# Patient Record
Sex: Male | Born: 1945
Health system: Southern US, Community
[De-identification: ages and names within clinical notes are randomized; demographics above are authoritative.]

## PROBLEM LIST (undated history)

## (undated) DIAGNOSIS — G4730 Sleep apnea, unspecified: Secondary | ICD-10-CM

## (undated) DIAGNOSIS — I639 Cerebral infarction, unspecified: Secondary | ICD-10-CM

## (undated) DIAGNOSIS — I480 Paroxysmal atrial fibrillation: Secondary | ICD-10-CM

## (undated) DIAGNOSIS — K219 Gastro-esophageal reflux disease without esophagitis: Secondary | ICD-10-CM

## (undated) DIAGNOSIS — F32A Depression, unspecified: Secondary | ICD-10-CM

## (undated) DIAGNOSIS — K589 Irritable bowel syndrome without diarrhea: Secondary | ICD-10-CM

## (undated) DIAGNOSIS — C4490 Unspecified malignant neoplasm of skin, unspecified: Secondary | ICD-10-CM

## (undated) DIAGNOSIS — J189 Pneumonia, unspecified organism: Secondary | ICD-10-CM

## (undated) DIAGNOSIS — E785 Hyperlipidemia, unspecified: Secondary | ICD-10-CM

## (undated) DIAGNOSIS — I219 Acute myocardial infarction, unspecified: Secondary | ICD-10-CM

## (undated) DIAGNOSIS — I10 Essential (primary) hypertension: Secondary | ICD-10-CM

## (undated) DIAGNOSIS — I2510 Atherosclerotic heart disease of native coronary artery without angina pectoris: Secondary | ICD-10-CM

## (undated) DIAGNOSIS — F329 Major depressive disorder, single episode, unspecified: Secondary | ICD-10-CM

## (undated) DIAGNOSIS — N400 Enlarged prostate without lower urinary tract symptoms: Secondary | ICD-10-CM

## (undated) HISTORY — DX: Depression, unspecified: F32.A

## (undated) HISTORY — DX: Benign prostatic hyperplasia without lower urinary tract symptoms: N40.0

## (undated) HISTORY — DX: Hyperlipidemia, unspecified: E78.5

## (undated) HISTORY — PX: UPPER GASTROINTESTINAL ENDOSCOPY: SHX188

## (undated) HISTORY — PX: THYROIDECTOMY, PARTIAL: SHX18

## (undated) HISTORY — PX: VASCULAR SURGERY: SHX849

## (undated) HISTORY — DX: Sleep apnea, unspecified: G47.30

## (undated) HISTORY — DX: Gastro-esophageal reflux disease without esophagitis: K21.9

## (undated) HISTORY — DX: Unspecified malignant neoplasm of skin, unspecified: C44.90

## (undated) HISTORY — PX: COLONOSCOPY: SHX174

## (undated) HISTORY — PX: CORONARY ANGIOPLASTY: SHX604

## (undated) HISTORY — DX: Paroxysmal atrial fibrillation: I48.0

## (undated) HISTORY — DX: Essential (primary) hypertension: I10

## (undated) HISTORY — DX: Cerebral infarction, unspecified: I63.9

## (undated) HISTORY — DX: Irritable bowel syndrome, unspecified: K58.9

## (undated) HISTORY — DX: Major depressive disorder, single episode, unspecified: F32.9

## (undated) HISTORY — PX: POLYPECTOMY: SHX149

## (undated) HISTORY — DX: Pneumonia, unspecified organism: J18.9

## (undated) HISTORY — DX: Atherosclerotic heart disease of native coronary artery without angina pectoris: I25.10

## (undated) HISTORY — DX: Acute myocardial infarction, unspecified: I21.9

---
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
## 2004-06-08 HISTORY — PX: CARDIAC CATHETERIZATION: SHX172

## 2012-10-27 LAB — TSH: TSH: 2.04 u[IU]/mL (ref 0.41–5.90)

## 2012-10-27 LAB — LIPID PANEL
Cholesterol: 177 mg/dL (ref 0–200)
Cholesterol: 177 mg/dL (ref 0–200)
HDL: 59 mg/dL (ref 35–70)
LDL Cholesterol: 78 mg/dL
TRIGLYCERIDES: 199 mg/dL — AB (ref 40–160)

## 2012-10-27 LAB — BASIC METABOLIC PANEL
BUN: 14 mg/dL (ref 4–21)
CREATININE: 1 mg/dL (ref 0.6–1.3)
Glucose: 96 mg/dL
Potassium: 4.6 mmol/L (ref 3.4–5.3)
Sodium: 140 mmol/L (ref 137–147)

## 2012-10-27 LAB — CBC AND DIFFERENTIAL
HCT: 44 % (ref 41–53)
HCT: 44 % (ref 41–53)
HEMOGLOBIN: 15.1 g/dL (ref 13.5–17.5)
HEMOGLOBIN: 15.1 g/dL (ref 13.5–17.5)
Neutrophils Absolute: 2 /uL
Neutrophils Absolute: 2 /uL
PLATELETS: 194 10*3/uL (ref 150–399)
Platelets: 194 10*3/uL (ref 150–399)
WBC: 4.3 10^3/mL
WBC: 4.3 10*3/mL

## 2012-10-27 LAB — HEPATIC FUNCTION PANEL
ALK PHOS: 85 U/L (ref 25–125)
ALT: 42 U/L — AB (ref 10–40)
AST: 40 U/L (ref 14–40)
BILIRUBIN, TOTAL: 0.5 mg/dL

## 2012-10-27 LAB — HEMOGLOBIN A1C: HEMOGLOBIN A1C: 5.4 % (ref 4.0–6.0)

## 2012-11-08 LAB — HM COLONOSCOPY

## 2014-10-16 LAB — BASIC METABOLIC PANEL
BUN: 16 mg/dL (ref 4–21)
CREATININE: 1.1 mg/dL (ref 0.6–1.3)
Glucose: 102 mg/dL
Potassium: 5.4 mmol/L — AB (ref 3.4–5.3)
Sodium: 140 mmol/L (ref 137–147)

## 2014-10-16 LAB — HEPATIC FUNCTION PANEL
ALK PHOS: 69 U/L (ref 25–125)
ALT: 29 U/L (ref 10–40)
AST: 23 U/L (ref 14–40)
Bilirubin, Total: 0.8 mg/dL

## 2014-10-16 LAB — CBC AND DIFFERENTIAL
HCT: 47 % (ref 41–53)
HEMOGLOBIN: 15.3 g/dL (ref 13.5–17.5)
NEUTROS ABS: 3 /uL
PLATELETS: 223 10*3/uL (ref 150–399)
WBC: 5.1 10*3/mL

## 2014-10-16 LAB — PSA: PSA: 2.035

## 2014-10-16 LAB — LIPID PANEL
CHOLESTEROL: 183 mg/dL (ref 0–200)
HDL: 63 mg/dL (ref 35–70)
LDL CALC: 98 mg/dL
TRIGLYCERIDES: 112 mg/dL (ref 40–160)

## 2014-10-16 LAB — TSH: TSH: 1.24 u[IU]/mL (ref 0.41–5.90)

## 2014-10-16 LAB — HEMOGLOBIN A1C: Hgb A1c MFr Bld: 5.3 % (ref 4.0–6.0)

## 2015-01-23 ENCOUNTER — Ambulatory Visit (INDEPENDENT_AMBULATORY_CARE_PROVIDER_SITE_OTHER): Payer: Medicare Other | Admitting: Cardiovascular Disease

## 2015-01-23 ENCOUNTER — Ambulatory Visit (INDEPENDENT_AMBULATORY_CARE_PROVIDER_SITE_OTHER): Payer: Medicare Other

## 2015-01-23 ENCOUNTER — Encounter (INDEPENDENT_AMBULATORY_CARE_PROVIDER_SITE_OTHER): Payer: Self-pay

## 2015-01-23 ENCOUNTER — Encounter: Payer: Self-pay | Admitting: Cardiovascular Disease

## 2015-01-23 VITALS — BP 122/90 | HR 64 | Ht 73.0 in | Wt 181.5 lb

## 2015-01-23 DIAGNOSIS — Z5181 Encounter for therapeutic drug level monitoring: Secondary | ICD-10-CM

## 2015-01-23 DIAGNOSIS — E785 Hyperlipidemia, unspecified: Secondary | ICD-10-CM | POA: Diagnosis not present

## 2015-01-23 DIAGNOSIS — I4891 Unspecified atrial fibrillation: Secondary | ICD-10-CM | POA: Diagnosis not present

## 2015-01-23 DIAGNOSIS — I639 Cerebral infarction, unspecified: Secondary | ICD-10-CM | POA: Diagnosis not present

## 2015-01-23 DIAGNOSIS — I480 Paroxysmal atrial fibrillation: Secondary | ICD-10-CM

## 2015-01-23 DIAGNOSIS — Z8673 Personal history of transient ischemic attack (TIA), and cerebral infarction without residual deficits: Secondary | ICD-10-CM | POA: Insufficient documentation

## 2015-01-23 DIAGNOSIS — I2510 Atherosclerotic heart disease of native coronary artery without angina pectoris: Secondary | ICD-10-CM | POA: Diagnosis not present

## 2015-01-23 LAB — POCT INR: INR: 3.4

## 2015-01-23 NOTE — Patient Instructions (Addendum)
Check the price of the following anticoagulants:  Pradaxa 150 mg twice a day Xarelto 20 mg a day Eliquis 5 mg twice a day Savaysa 60 mg a day.  Medication Instructions:  Your physician recommends that you continue on your current medications as directed. Please refer to the Current Medication list given to you today.   Labwork: Coumadin Clinic today Your physician recommends that you return for lab work in one year: lipid, liver, BMET   Testing/Procedures: none  Follow-Up: Your physician wants you to follow-up in: one year with Dr. Acie Fredrickson. You will receive a reminder letter in the mail two months in advance. If you don't receive a letter, please call our office to schedule the follow-up appointment.   Any Other Special Instructions Will Be Listed Below (If Applicable).

## 2015-01-23 NOTE — Progress Notes (Signed)
Cardiology Office Note   Date:  01/23/2015   ID:  [name], DOB 1946-04-17, MRN [id number]  PCP:  Thersa Salt, DO  Cardiologist:   Acie Fredrickson Wonda Cheng, MD   Chief Complaint  Patient presents with  . other    Est. care recently moved from [place]. Hx afib discuss coumadin. Meds reviewed verbally with pt.   Problem List 1. Paroxysmal atrial fib  2. CAD - s/p stenting , Jan. 2007 , mini vision to LCx 3. CVA - 2 different strokes, the 2nd CVA was while on coumadin First stroke was major, was paralyzed on left side, received tPA and improved significantly  4. Hyperlipidemia 5. Essential hypertension     History of Present Illness: [name] is a 69 y.o. male who presents for evaluation of his paroxysmal atrial fib.  [name] recently moved from [place] , [place]. Is retired as a Media planner regularly, no CP or dyspnea Has some residual left sided weakness but otherwise is doing ok  Has very rare episodes of atrial fib.  Required a 14 day monitor that eventually. He was generally asymptomatic during the episodes     Past Medical History  Diagnosis Date  . A-fib   . Coronary artery disease   . MI (myocardial infarction)   . Hyperlipidemia   . Hypertension   . Stroke     Past Surgical History  Procedure Laterality Date  . Thyroidectomy, partial    . Cardiac catheterization  2006    X1 STENT     Current Outpatient Prescriptions  Medication Sig Dispense Refill  . aspirin 81 MG tablet Take 81 mg by mouth daily.    Marland Kitchen atorvastatin (LIPITOR) 20 MG tablet Take 20 mg by mouth daily.    . bisoprolol (ZEBETA) 5 MG tablet Take 5 mg by mouth daily.    . Cholecalciferol (VITAMIN D) 2000 UNITS CAPS Take by mouth 2 (two) times daily.    Marland Kitchen dicyclomine (BENTYL) 10 MG capsule Take 10 mg by mouth 2 (two) times daily.    Marland Kitchen escitalopram (LEXAPRO) 10 MG tablet Take 5 mg by mouth daily.    . hydrochlorothiazide (MICROZIDE) 12.5 MG capsule Take 12.5 mg by mouth  daily.    Marland Kitchen lisinopril (PRINIVIL,ZESTRIL) 20 MG tablet Take 20 mg by mouth 2 (two) times daily.    . ranitidine (ZANTAC) 150 MG tablet Take 150 mg by mouth 2 (two) times daily.    . vitamin B-12 (CYANOCOBALAMIN) 1000 MCG tablet Take 1,000 mcg by mouth daily.    Marland Kitchen warfarin (COUMADIN) 5 MG tablet Take 5 mg by mouth as directed. 1 1/2 TABLET PM EVENING EXCEPT 2 ON SUN. & WED.     No current facility-administered medications for this visit.    Allergies:   Bactrim and Statins    Social History:  The patient  reports that he has quit smoking. He does not have any smokeless tobacco history on file. He reports that he drinks alcohol. He reports that he does not use illicit drugs.   Family History:  The patient'sfamily history includes Heart Problems in his father.    ROS:  Please see the history of present illness.    Review of Systems: Constitutional:  denies fever, chills, diaphoresis, appetite change and fatigue.  HEENT: denies photophobia, eye pain, redness, hearing loss, ear pain, congestion, sore throat, rhinorrhea, sneezing, neck pain, neck stiffness and tinnitus.  Respiratory: denies SOB, DOE, cough, chest tightness, and wheezing.  Cardiovascular: denies chest  pain, palpitations and leg swelling.  Gastrointestinal: denies nausea, vomiting, abdominal pain, diarrhea, constipation, blood in stool.  Genitourinary: denies dysuria, urgency, frequency, hematuria, flank pain and difficulty urinating.  Musculoskeletal: denies  myalgias, back pain, joint swelling, arthralgias and gait problem.   Skin: denies pallor, rash and wound.  Neurological: denies dizziness, seizures, syncope, weakness, light-headedness, numbness and headaches.   Hematological: denies adenopathy, easy bruising, personal or family bleeding history.  Psychiatric/ Behavioral: denies suicidal ideation, mood changes, confusion, nervousness, sleep disturbance and agitation.       All other systems are reviewed and  negative.    PHYSICAL EXAM: VS:  BP 122/90 mmHg  Pulse 64  Ht 6\' 1"  (1.854 m)  Wt 82.328 kg (181 lb 8 oz)  BMI 23.95 kg/m2 , BMI Body mass index is 23.95 kg/(m^2). GEN: Well nourished, well developed, in no acute distress HEENT: normal Neck: no JVD, carotid bruits, or masses Cardiac: RRR; no murmurs, rubs, or gallops,no edema  Respiratory:  clear to auscultation bilaterally, normal work of breathing GI: soft, nontender, nondistended, + BS MS: no deformity or atrophy Skin: warm and dry, no rash Neuro:  Strength and sensation are intact Psych: normal   EKG:  EKG is ordered today. The ekg ordered today demonstrates  NSR at 64.  Normal     Recent Labs: No results found for requested labs within last 365 days.    Lipid Panel No results found for: CHOL, TRIG, HDL, CHOLHDL, VLDL, LDLCALC, LDLDIRECT    Wt Readings from Last 3 Encounters:  01/23/15 82.328 kg (181 lb 8 oz)      Other studies Reviewed: Additional studies/ records that were reviewed today include: . Review of the above records demonstrates:    ASSESSMENT AND PLAN:  1.  Paroxysmal atrial fibrillation: The patient has a history of paroxysmal atrial fibrillation. He's had multiple TIAs and 2 different strokes. He's now on Coumadin. He recently moved from [facility]. He's done very well. He's basically asymptomatic and cannot tell when he is in atrial fibrillation. He seems to be doing fine from a cardiac stent point. We'll enroll him in our Coumadin clinic. I've given him a list of the different new anticoagulaton agents and he can check the price of those.  For now we'll stable Coumadin. I'll see him again in one year for follow-up office visit.  2. Coronary artery disease: The patient has a history of stenting in the past. He's asymptomatic. He is able to do all of his normal activities without any significant problems.  3. Hyperlipidemia: We'll check fasting labs at his next office visit. Continue  current dose of atorvastatin.  4. Hypertension: Continue Current medications. His blood pressure is normal.    Current medicines are reviewed at length with the patient today.  The patient does not have concerns regarding medicines.  The following changes have been made:  no change  Labs/ tests ordered today include:  Orders Placed This Encounter  Procedures  . EKG 12-Lead     Disposition:   FU with me in 1 year      Nahser, Wonda Cheng, MD  01/23/2015 11:15 AM    [facility] [place], [place] [place]  [address] Phone: [phone]; Fax: [phone]   [facility]  [address] [place], [place]  [address] [phone]   Fax [phone]

## 2015-02-08 ENCOUNTER — Ambulatory Visit (INDEPENDENT_AMBULATORY_CARE_PROVIDER_SITE_OTHER): Payer: PPO | Admitting: Family Medicine

## 2015-02-08 ENCOUNTER — Encounter: Payer: Self-pay | Admitting: Family Medicine

## 2015-02-08 VITALS — BP 108/64 | HR 61 | Temp 98.1°F | Ht 72.5 in | Wt 183.5 lb

## 2015-02-08 DIAGNOSIS — F419 Anxiety disorder, unspecified: Secondary | ICD-10-CM | POA: Diagnosis not present

## 2015-02-08 DIAGNOSIS — I2510 Atherosclerotic heart disease of native coronary artery without angina pectoris: Secondary | ICD-10-CM | POA: Diagnosis not present

## 2015-02-08 DIAGNOSIS — Z7901 Long term (current) use of anticoagulants: Secondary | ICD-10-CM | POA: Diagnosis not present

## 2015-02-08 DIAGNOSIS — E785 Hyperlipidemia, unspecified: Secondary | ICD-10-CM

## 2015-02-08 DIAGNOSIS — K219 Gastro-esophageal reflux disease without esophagitis: Secondary | ICD-10-CM | POA: Insufficient documentation

## 2015-02-08 DIAGNOSIS — Z87898 Personal history of other specified conditions: Secondary | ICD-10-CM | POA: Diagnosis not present

## 2015-02-08 DIAGNOSIS — K589 Irritable bowel syndrome without diarrhea: Secondary | ICD-10-CM | POA: Insufficient documentation

## 2015-02-08 DIAGNOSIS — F329 Major depressive disorder, single episode, unspecified: Secondary | ICD-10-CM

## 2015-02-08 DIAGNOSIS — N400 Enlarged prostate without lower urinary tract symptoms: Secondary | ICD-10-CM | POA: Insufficient documentation

## 2015-02-08 DIAGNOSIS — I10 Essential (primary) hypertension: Secondary | ICD-10-CM | POA: Insufficient documentation

## 2015-02-08 DIAGNOSIS — F32A Depression, unspecified: Secondary | ICD-10-CM | POA: Insufficient documentation

## 2015-02-08 DIAGNOSIS — I480 Paroxysmal atrial fibrillation: Secondary | ICD-10-CM

## 2015-02-08 DIAGNOSIS — Z23 Encounter for immunization: Secondary | ICD-10-CM | POA: Diagnosis not present

## 2015-02-08 DIAGNOSIS — F418 Other specified anxiety disorders: Secondary | ICD-10-CM

## 2015-02-08 DIAGNOSIS — I639 Cerebral infarction, unspecified: Secondary | ICD-10-CM

## 2015-02-08 LAB — LIPID PANEL
CHOLESTEROL: 174 mg/dL (ref 0–200)
HDL: 58.1 mg/dL (ref >39.00)
LDL Cholesterol: 93 mg/dL (ref 0–99)
NONHDL: 115.95
Total CHOL/HDL Ratio: 3
Triglycerides: 114 mg/dL (ref 0.0–149.0)
VLDL: 22.8 mg/dL (ref 0.0–40.0)

## 2015-02-08 LAB — CBC
HEMATOCRIT: 43.7 % (ref 39.0–52.0)
Hemoglobin: 14.6 g/dL (ref 13.0–17.0)
MCHC: 33.4 g/dL (ref 30.0–36.0)
MCV: 93.6 fl (ref 78.0–100.0)
Platelets: 168 10*3/uL (ref 150.0–400.0)
RBC: 4.67 Mil/uL (ref 4.22–5.81)
RDW: 14.1 % (ref 11.5–15.5)
WBC: 5.9 10*3/uL (ref 4.0–10.5)

## 2015-02-08 LAB — COMPREHENSIVE METABOLIC PANEL
ALBUMIN: 4 g/dL (ref 3.5–5.2)
ALK PHOS: 81 U/L (ref 39–117)
ALT: 25 U/L (ref 0–53)
AST: 28 U/L (ref 0–37)
BILIRUBIN TOTAL: 0.8 mg/dL (ref 0.2–1.2)
BUN: 15 mg/dL (ref 6–23)
CO2: 31 mEq/L (ref 19–32)
CREATININE: 0.92 mg/dL (ref 0.40–1.50)
Calcium: 9.5 mg/dL (ref 8.4–10.5)
Chloride: 105 mEq/L (ref 96–112)
GFR: 86.71 mL/min (ref >60.00)
GLUCOSE: 95 mg/dL (ref 70–99)
POTASSIUM: 4.7 meq/L (ref 3.5–5.1)
SODIUM: 144 meq/L (ref 135–145)
TOTAL PROTEIN: 7 g/dL (ref 6.0–8.3)

## 2015-02-08 LAB — PSA: PSA: 0.9 ng/mL (ref 0.10–4.00)

## 2015-02-08 LAB — HEMOGLOBIN A1C: HEMOGLOBIN A1C: 5.3 % (ref 4.6–6.5)

## 2015-02-08 MED ORDER — WARFARIN SODIUM 5 MG PO TABS: 5.0000 mg | ORAL_TABLET | ORAL | Status: DC

## 2015-02-08 MED ORDER — ESCITALOPRAM OXALATE 10 MG PO TABS: 5.0000 mg | ORAL_TABLET | Freq: Every day | ORAL | Status: DC

## 2015-02-08 NOTE — Assessment & Plan Note (Signed)
Patient doing well at this time. He does have some residual deficits. Continue aspirin and Lipitor.

## 2015-02-08 NOTE — Assessment & Plan Note (Signed)
Per 2013 guidelines patient is a candidate for high potency statin therapy. I encouraged him to consider increasing his Lipitor to 40 mg daily. Given prior intolerance of statins patient would like to hold off on this until his cholesterol is reevaluated. Obtaining lipid panel today.

## 2015-02-08 NOTE — Progress Notes (Signed)
Pre visit review using our clinic review tool, if applicable. No additional management support is needed unless otherwise documented below in the visit note. 

## 2015-02-08 NOTE — Assessment & Plan Note (Signed)
Stable. Advised patient to continue ASA, Lipitor, Lisinopril/HCTZ and Bisoprolol. Advised close follow up with Cardiology.

## 2015-02-08 NOTE — Assessment & Plan Note (Signed)
Stable on bisoprolol and Coumadin. We had a brief discussion about new anti-coagulants.  Patient will like to call his insurance prior to starting 1 to see how much is going to cost. Will continue Coumadin for now.

## 2015-02-08 NOTE — Progress Notes (Signed)
Subjective:  Patient ID: [name], male    DOB: 1945/09/11  Age: 69 y.o. MRN: [id number]  CC: Establish care  HPI [name] is a 69 y.o. male presents to the clinic today to establish care.  Current issues are below:  1) CAD s/p MI and PCI  Stable.  No chest pain or SOB.  Followed closely by cardiology.  Compliant with ASA, Lisinopril, Lipitor, Bisoprolol.  2) A fib  Stable.   Doing well on coumadin; would like to discuss new anticoagulants.  3) CVA  Patient has had memory deficits and motor deficits (L sided weakness following CVA).  He is doing well at this time.  4) HLD  Uncontrolled; Last panel was 10/2014. At that time LDL was 98.  Complaint with Lipitor 20 mg daily.  Has had intolerance to crestor previously.   5) Anxiety/depression  Stable on Lexapro.  Requesting refill today.  6) HTN  Well controlled on Lisinopril/HCTZ and Bisoprolol.  PMH, Surgical Hx, Family Hx, Social History reviewed and updated as below. Past Medical History  Diagnosis Date  . A-fib   . Coronary artery disease     Hx of MI; S/P PCI  . Hyperlipidemia   . Hypertension   . Stroke   . GERD (gastroesophageal reflux disease)   . IBS (irritable bowel syndrome)   . PNA (pneumonia)   . Depression   . IBS (irritable bowel syndrome)   . Skin cancer   . BPH (benign prostatic hyperplasia)     Past Surgical History  Procedure Laterality Date  . Thyroidectomy, partial    . Cardiac catheterization  2006    X1 STENT  . Adenoidectomy  1951   Family History  Problem Relation Age of Onset  . Heart Problems Father   . Heart disease Father   . Hypertension Father   . Mental illness Mother   . Arthritis Mother   . Cancer Mother     ovarian  . Stroke Paternal Uncle   . Heart disease Paternal Grandfather   . Cancer Maternal Aunt   . Stroke Maternal Grandmother   . Sudden death Maternal Grandfather   . Heart disease Paternal Grandmother     Social History    Substance Use Topics  . Smoking status: Former Research scientist (life sciences)  . Smokeless tobacco: Not on file  . Alcohol Use: Yes   Review of Systems  Constitutional: Negative.   HENT: Negative.   Respiratory: Negative for chest tightness.   Gastrointestinal: Negative.   Endocrine: Negative.   Genitourinary: Positive for frequency.  Musculoskeletal: Positive for joint swelling and arthralgias.  Skin: Negative.   Neurological: Negative.   Psychiatric/Behavioral:       Stress.   Objective:   Today's Vitals: BP 108/64 mmHg  Pulse 61  Temp(Src) 98.1 F (36.7 C) (Oral)  Ht 6' 0.5" (1.842 m)  Wt 183 lb 8 oz (83.235 kg)  BMI 24.53 kg/m2  SpO2 97%  Physical Exam  Constitutional: He is oriented to person, place, and time. He appears well-developed and well-nourished. No distress.  HENT:  Head: Normocephalic and atraumatic.  Mouth/Throat: Oropharynx is clear and moist. No oropharyngeal exudate.  Normal TM's bilaterally.   Eyes: Conjunctivae are normal. No scleral icterus.  Neck: Neck supple.  Cardiovascular: Normal rate and regular rhythm.   No murmur heard. No carotid bruits.   Pulmonary/Chest: Effort normal and breath sounds normal. He has no wheezes. He has no rales.  Abdominal: Soft. He exhibits no distension. There is no tenderness.  There is no rebound and no guarding.  Musculoskeletal: Normal range of motion. He exhibits no edema.  2+ DP and PT pulses.   Neurological: He is alert and oriented to person, place, and time.  Skin: Skin is warm and dry. No rash noted.  Psychiatric:  Flat affect.   Vitals reviewed.  Assessment & Plan:   Problem List Items Addressed This Visit    A-fib    Stable on bisoprolol and Coumadin. We had a brief discussion about new anti-coagulants.  Patient will like to call his insurance prior to starting 1 to see how much is going to cost. Will continue Coumadin for now.      Relevant Medications   warfarin (COUMADIN) 5 MG tablet   RESOLVED: Anxiety    Relevant Medications   escitalopram (LEXAPRO) 10 MG tablet   Anxiety and depression    Stable. Refilled Lexapro today.      CAD (coronary artery disease) - Primary    Stable. Advised patient to continue ASA, Lipitor, Lisinopril/HCTZ and Bisoprolol. Advised close follow up with Cardiology.      Relevant Medications   warfarin (COUMADIN) 5 MG tablet   Other Relevant Orders   Hemoglobin A1c (Completed)   CVA (cerebral infarction)    Patient doing well at this time. He does have some residual deficits. Continue aspirin and Lipitor.       Essential hypertension   Relevant Medications   warfarin (COUMADIN) 5 MG tablet   Other Relevant Orders   Comp Met (CMET) (Completed)   Hyperlipidemia    Per 2013 guidelines patient is a candidate for high potency statin therapy. I encouraged him to consider increasing his Lipitor to 40 mg daily. Given prior intolerance of statins patient would like to hold off on this until his cholesterol is reevaluated. Obtaining lipid panel today.      Relevant Medications   warfarin (COUMADIN) 5 MG tablet   Other Relevant Orders   Lipid Profile (Completed)    Other Visit Diagnoses    History of elevated PSA        Relevant Orders    PSA (Completed)    Encounter for immunization        Long term current use of anticoagulant therapy        Relevant Medications    warfarin (COUMADIN) 5 MG tablet    Other Relevant Orders    CBC (Completed)       Outpatient Encounter Prescriptions as of 02/08/2015  Medication Sig  . aspirin 81 MG tablet Take 81 mg by mouth daily.  Marland Kitchen atorvastatin (LIPITOR) 20 MG tablet Take 20 mg by mouth daily.  . bisoprolol (ZEBETA) 5 MG tablet Take 5 mg by mouth daily.  . Cholecalciferol (VITAMIN D) 2000 UNITS CAPS Take by mouth 2 (two) times daily.  Marland Kitchen dicyclomine (BENTYL) 10 MG capsule Take 10 mg by mouth 2 (two) times daily.  Marland Kitchen escitalopram (LEXAPRO) 10 MG tablet Take 0.5 tablets (5 mg total) by mouth daily.  .  hydrochlorothiazide (MICROZIDE) 12.5 MG capsule Take 12.5 mg by mouth daily.  Marland Kitchen lisinopril (PRINIVIL,ZESTRIL) 20 MG tablet Take 20 mg by mouth 2 (two) times daily.  . ranitidine (ZANTAC) 150 MG tablet Take 150 mg by mouth 2 (two) times daily.  . vitamin B-12 (CYANOCOBALAMIN) 1000 MCG tablet Take 1,000 mcg by mouth daily.  Marland Kitchen warfarin (COUMADIN) 5 MG tablet Take 1 tablet (5 mg total) by mouth as directed. 1 1/2 TABLET PM EVENING EXCEPT 2 ON SUN. &  WED.  . [DISCONTINUED] escitalopram (LEXAPRO) 10 MG tablet Take 5 mg by mouth daily.  . [DISCONTINUED] warfarin (COUMADIN) 5 MG tablet Take 5 mg by mouth as directed. 1 1/2 TABLET PM EVENING EXCEPT 2 ON SUN. & WED.   No facility-administered encounter medications on file as of 02/08/2015.    Follow-up: 6 months.   Coral Spikes DO

## 2015-02-08 NOTE — Patient Instructions (Signed)
Continue your current medications as prescribed.  Follow up in ~ 6 months or sooner if needed.  We will call with the results of your labs.  Take care  Dr. Lacinda Axon

## 2015-02-08 NOTE — Assessment & Plan Note (Signed)
Stable. Refilled Lexapro today.

## 2015-02-12 ENCOUNTER — Ambulatory Visit: Payer: PPO | Admitting: Family Medicine

## 2015-02-13 ENCOUNTER — Ambulatory Visit (INDEPENDENT_AMBULATORY_CARE_PROVIDER_SITE_OTHER): Payer: PPO

## 2015-02-13 DIAGNOSIS — I639 Cerebral infarction, unspecified: Secondary | ICD-10-CM

## 2015-02-13 DIAGNOSIS — Z5181 Encounter for therapeutic drug level monitoring: Secondary | ICD-10-CM

## 2015-02-13 DIAGNOSIS — I480 Paroxysmal atrial fibrillation: Secondary | ICD-10-CM | POA: Diagnosis not present

## 2015-02-13 LAB — POCT INR: INR: 3.3

## 2015-02-18 ENCOUNTER — Other Ambulatory Visit: Payer: Self-pay | Admitting: *Deleted

## 2015-02-18 ENCOUNTER — Telehealth: Payer: Self-pay | Admitting: *Deleted

## 2015-02-18 ENCOUNTER — Telehealth: Payer: Self-pay

## 2015-02-18 MED ORDER — BISOPROLOL FUMARATE 5 MG PO TABS: 5.0000 mg | ORAL_TABLET | Freq: Every day | ORAL | Status: DC

## 2015-02-18 NOTE — Telephone Encounter (Signed)
Pt brought in a placard paper work for the doctor to sign and it was signed and left in the front for him to pick up. He was called to pick them up.

## 2015-02-18 NOTE — Telephone Encounter (Signed)
Patient has dropped off paperwork to be filled out. The paperwork will be in Dr.Cook's mailbox. Thanks

## 2015-02-26 ENCOUNTER — Encounter: Payer: Self-pay | Admitting: Family Medicine

## 2015-03-13 ENCOUNTER — Ambulatory Visit (INDEPENDENT_AMBULATORY_CARE_PROVIDER_SITE_OTHER): Payer: PPO

## 2015-03-13 DIAGNOSIS — Z5181 Encounter for therapeutic drug level monitoring: Secondary | ICD-10-CM | POA: Diagnosis not present

## 2015-03-13 DIAGNOSIS — I480 Paroxysmal atrial fibrillation: Secondary | ICD-10-CM

## 2015-03-13 DIAGNOSIS — I639 Cerebral infarction, unspecified: Secondary | ICD-10-CM | POA: Diagnosis not present

## 2015-03-13 LAB — POCT INR: INR: 3.7

## 2015-03-21 ENCOUNTER — Telehealth: Payer: Self-pay | Admitting: Family Medicine

## 2015-03-21 NOTE — Telephone Encounter (Signed)
Fine by me 

## 2015-03-21 NOTE — Telephone Encounter (Signed)
Pt came in wanting to know if he could start having his INR done at our office.. Please advise pt.

## 2015-03-22 ENCOUNTER — Other Ambulatory Visit: Payer: Self-pay | Admitting: Family Medicine

## 2015-03-22 DIAGNOSIS — Z7901 Long term (current) use of anticoagulants: Secondary | ICD-10-CM

## 2015-03-22 NOTE — Telephone Encounter (Signed)
Has been scheduled on 04/10/15 @ 10:15

## 2015-04-08 ENCOUNTER — Other Ambulatory Visit (INDEPENDENT_AMBULATORY_CARE_PROVIDER_SITE_OTHER): Payer: PPO

## 2015-04-08 DIAGNOSIS — Z7901 Long term (current) use of anticoagulants: Secondary | ICD-10-CM | POA: Diagnosis not present

## 2015-04-08 LAB — PROTIME-INR
INR: 3.2 ratio — AB (ref 0.8–1.0)
PROTHROMBIN TIME: 34.8 s — AB (ref 9.6–13.1)

## 2015-04-10 ENCOUNTER — Other Ambulatory Visit: Payer: PPO

## 2015-04-16 ENCOUNTER — Other Ambulatory Visit: Payer: Self-pay | Admitting: Family Medicine

## 2015-04-16 ENCOUNTER — Telehealth: Payer: Self-pay | Admitting: Family Medicine

## 2015-04-16 MED ORDER — DICYCLOMINE HCL 10 MG PO CAPS: 10.0000 mg | ORAL_CAPSULE | Freq: Two times a day (BID) | ORAL | Status: DC

## 2015-04-16 NOTE — Telephone Encounter (Signed)
Pt called needing a refill for a medication.

## 2015-04-23 ENCOUNTER — Other Ambulatory Visit (INDEPENDENT_AMBULATORY_CARE_PROVIDER_SITE_OTHER): Payer: PPO

## 2015-04-23 DIAGNOSIS — Z7901 Long term (current) use of anticoagulants: Secondary | ICD-10-CM | POA: Diagnosis not present

## 2015-04-23 LAB — PROTIME-INR
INR: 2.2 ratio — ABNORMAL HIGH (ref 0.8–1.0)
Prothrombin Time: 24.1 s — ABNORMAL HIGH (ref 9.6–13.1)

## 2015-05-20 ENCOUNTER — Telehealth: Payer: Self-pay

## 2015-05-20 NOTE — Telephone Encounter (Signed)
Dicyclomine [place] started on cover my meds.

## 2015-05-22 ENCOUNTER — Ambulatory Visit: Payer: Self-pay

## 2015-05-22 DIAGNOSIS — Z5181 Encounter for therapeutic drug level monitoring: Secondary | ICD-10-CM

## 2015-05-22 DIAGNOSIS — I639 Cerebral infarction, unspecified: Secondary | ICD-10-CM

## 2015-05-22 DIAGNOSIS — I480 Paroxysmal atrial fibrillation: Secondary | ICD-10-CM

## 2015-05-24 ENCOUNTER — Other Ambulatory Visit: Payer: PPO

## 2015-05-24 ENCOUNTER — Other Ambulatory Visit (INDEPENDENT_AMBULATORY_CARE_PROVIDER_SITE_OTHER): Payer: PPO

## 2015-05-24 DIAGNOSIS — Z7901 Long term (current) use of anticoagulants: Secondary | ICD-10-CM | POA: Diagnosis not present

## 2015-05-24 LAB — PROTIME-INR
INR: 2.3 ratio — ABNORMAL HIGH (ref 0.8–1.0)
PROTHROMBIN TIME: 24.5 s — AB (ref 9.6–13.1)

## 2015-06-07 ENCOUNTER — Ambulatory Visit (INDEPENDENT_AMBULATORY_CARE_PROVIDER_SITE_OTHER): Payer: PPO | Admitting: Family Medicine

## 2015-06-07 ENCOUNTER — Encounter: Payer: Self-pay | Admitting: Family Medicine

## 2015-06-07 ENCOUNTER — Telehealth: Payer: Self-pay | Admitting: *Deleted

## 2015-06-07 VITALS — BP 160/99 | HR 60 | Temp 97.6°F | Ht 72.5 in | Wt 187.5 lb

## 2015-06-07 DIAGNOSIS — R079 Chest pain, unspecified: Secondary | ICD-10-CM

## 2015-06-07 DIAGNOSIS — I10 Essential (primary) hypertension: Secondary | ICD-10-CM

## 2015-06-07 MED ORDER — HYDROCHLOROTHIAZIDE 12.5 MG PO CAPS: 25.0000 mg | ORAL_CAPSULE | Freq: Every day | ORAL | Status: DC

## 2015-06-07 NOTE — Patient Instructions (Signed)
I have increased your HCTZ to 25 mg daily.  Keep a log of your BP's. We will call and check in next week.  They are switching you to [facility] but I was told it will take some time.  I am sending you to [facility] for evaluation given the chest pain (to be cautious).  You will be following up with Dr. Rockey Situ after that.  Take care  Dr. Lacinda Axon

## 2015-06-07 NOTE — Assessment & Plan Note (Signed)
New problem (to me). Patient endorsing left-sided chest pressure/tightness. He states this is chronic and intermittent. Not associated with exertion. No known relieving factors. He does report some left arm tingling with it. It is unclear to me whether it is cardiac in nature as it is not typical.  EKG was obtained today and inability reviewed by me. Interpretation: Sinus bradycardia at a rate of 55. Normal intervals. No ST or T-wave changes. No signs of ischemia.  Given symptoms, I'm sending him for evaluation by cardiology. Appt scheduled for 1/3.  Of note, patient will be transitioning his care from Dr. Acie Fredrickson to Dr. Rockey Situ.

## 2015-06-07 NOTE — Progress Notes (Signed)
Subjective:  Patient ID: [name], male    DOB: 1945/11/24  Age: 69 y.o. MRN: [id number]  CC: Elevated BP, chest tightness  HPI:  69 year old male with a PMH of CAD s/p PCI, Afib (on Coumadin), HTN, HLD, Hx of CVA presents with the above issues.  Elevated BP  For the past 2 weeks his BP has been elevated - ([id number] systolic)  He states he has been compliant with his medications - lisinopril, HCTZ, bisoprolol.  No new medication changes.  His diet has been worse over the holiday season. He's been eating high salt foods including biscuits and sausage.  No reports of shortness of breath. He has had some chest tightness which he states is chronic. See below.  Chest pain  Patient states that he has been having left-sided chest pain.  He describes it as a tightness/pressure.  He states that it's intermittent and has been going on for years.  He states that he's having chest pain today, while sitting.  He states it is not associated with exertion.  No associated shortness of breath. No diaphoresis.  He reports associated tingling in left arm.  No known exacerbating or relieving factors.  He states that it lasts from minutes to hours.  Social Hx   Social History   Social History  . Marital Status: Married    Spouse Name: N/A  . Number of Children: N/A  . Years of Education: N/A   Social History Main Topics  . Smoking status: Former Research scientist (life sciences)  . Smokeless tobacco: None  . Alcohol Use: Yes  . Drug Use: No  . Sexual Activity: Not Asked   Other Topics Concern  . None   Social History Narrative   Review of Systems  Constitutional: Negative.  Negative for diaphoresis.  Respiratory: Positive for chest tightness. Negative for shortness of breath.    Objective:  BP 160/99 mmHg  Pulse 60  Temp(Src) 97.6 F (36.4 C) (Oral)  Ht 6' 0.5" (1.842 m)  Wt 187 lb 8 oz (85.049 kg)  BMI 25.07 kg/m2  SpO2 97%  BP/Weight 06/07/2015 02/08/2015 [id number]  Systolic BP  0000000 [id number]  Diastolic BP 99 64 90  Wt. (Lbs) 187.5 183.5 181.5  BMI 25.07 24.53 23.95   Physical Exam  Constitutional: He appears well-developed. No distress.  Cardiovascular: Normal rate and regular rhythm.   No murmur heard. Pulmonary/Chest: Effort normal and breath sounds normal. No respiratory distress. He has no wheezes. He has no rales. He exhibits no tenderness.  Abdominal: Soft. He exhibits no distension. There is no tenderness.  Neurological: He is alert.  Psychiatric: He has a normal mood and affect.  Vitals reviewed.  Lab Results  Component Value Date   WBC 5.9 02/08/2015   HGB 14.6 02/08/2015   HCT 43.7 02/08/2015   PLT 168.0 02/08/2015   GLUCOSE 95 02/08/2015   CHOL 174 02/08/2015   TRIG 114.0 02/08/2015   HDL 58.10 02/08/2015   LDLCALC 93 02/08/2015   ALT 25 02/08/2015   AST 28 02/08/2015   NA 144 02/08/2015   K 4.7 02/08/2015   CL 105 02/08/2015   CREATININE 0.92 02/08/2015   BUN 15 02/08/2015   CO2 31 02/08/2015   TSH 1.24 10/16/2014   PSA 0.90 02/08/2015   INR 2.3* 05/24/2015   HGBA1C 5.3 02/08/2015    Assessment & Plan:   Problem List Items Addressed This Visit    Essential hypertension    Established problem, worsening over the past 2  weeks. Likely from poor diet. Discuss limitation of fatty and high sodium foods. Increasing HCTZ to 25 mg daily.  Lisinopril at max dose of 40 mg daily. Cannot increase Bisoprolol given patients HR. If remains elevated will have to add another med.  Patient to check BP at home and inform me of BP readings over the next 1-2 weeks.       Relevant Medications   hydrochlorothiazide (MICROZIDE) 12.5 MG capsule   Chest pain - Primary    New problem (to me). Patient endorsing left-sided chest pressure/tightness. He states this is chronic and intermittent. Not associated with exertion. No known relieving factors. He does report some left arm tingling with it. It is unclear to me whether it is cardiac in nature as it is  not typical.  EKG was obtained today and inability reviewed by me. Interpretation: Sinus bradycardia at a rate of 55. Normal intervals. No ST or T-wave changes. No signs of ischemia.  Given symptoms, I'm sending him for evaluation by cardiology. Appt scheduled for 1/3.  Of note, patient will be transitioning his care from Dr. Acie Fredrickson to Dr. Rockey Situ.      Relevant Orders   EKG 12-Lead (Completed)     Meds ordered this encounter  Medications  . hydrochlorothiazide (MICROZIDE) 12.5 MG capsule    Sig: Take 2 capsules (25 mg total) by mouth daily.    Dispense:  180 capsule    Refill:  1    Follow-up: Will call patient next week regarding BP  [place]

## 2015-06-07 NOTE — Telephone Encounter (Signed)
Patient was seen this morning, his hydochlorothiazidenmeication has been doubled,stated the patient. He requested a call from, the office for better understanding of how to take the medication . Please advise.

## 2015-06-07 NOTE — Progress Notes (Signed)
Pre visit review using our clinic review tool, if applicable. No additional management support is needed unless otherwise documented below in the visit note. 

## 2015-06-07 NOTE — Assessment & Plan Note (Signed)
Established problem, worsening over the past 2 weeks. Likely from poor diet. Discuss limitation of fatty and high sodium foods. Increasing HCTZ to 25 mg daily.  Lisinopril at max dose of 40 mg daily. Cannot increase Bisoprolol given patients HR. If remains elevated will have to add another med.  Patient to check BP at home and inform me of BP readings over the next 1-2 weeks.

## 2015-06-07 NOTE — Telephone Encounter (Signed)
Please advise 

## 2015-06-07 NOTE — Telephone Encounter (Signed)
Patient was called and told why he was taking the two tablets of HCTZ

## 2015-06-11 ENCOUNTER — Encounter (HOSPITAL_COMMUNITY): Payer: PPO

## 2015-06-11 ENCOUNTER — Encounter: Payer: Self-pay | Admitting: Cardiology

## 2015-06-11 ENCOUNTER — Telehealth (HOSPITAL_COMMUNITY): Payer: Self-pay | Admitting: *Deleted

## 2015-06-11 ENCOUNTER — Telehealth: Payer: Self-pay | Admitting: Family Medicine

## 2015-06-11 ENCOUNTER — Ambulatory Visit (INDEPENDENT_AMBULATORY_CARE_PROVIDER_SITE_OTHER): Payer: PPO | Admitting: Cardiology

## 2015-06-11 VITALS — BP 124/88 | HR 60 | Ht 73.0 in | Wt 187.6 lb

## 2015-06-11 DIAGNOSIS — I2510 Atherosclerotic heart disease of native coronary artery without angina pectoris: Secondary | ICD-10-CM | POA: Diagnosis not present

## 2015-06-11 DIAGNOSIS — R079 Chest pain, unspecified: Secondary | ICD-10-CM | POA: Diagnosis not present

## 2015-06-11 DIAGNOSIS — I480 Paroxysmal atrial fibrillation: Secondary | ICD-10-CM

## 2015-06-11 LAB — BASIC METABOLIC PANEL
BUN: 18 mg/dL (ref 7–25)
CALCIUM: 9 mg/dL (ref 8.6–10.3)
CO2: 29 mmol/L (ref 20–31)
CREATININE: 0.99 mg/dL (ref 0.70–1.25)
Chloride: 102 mmol/L (ref 98–110)
GLUCOSE: 82 mg/dL (ref 65–99)
Potassium: 4.4 mmol/L (ref 3.5–5.3)
SODIUM: 138 mmol/L (ref 135–146)

## 2015-06-11 MED ORDER — ATORVASTATIN CALCIUM 20 MG PO TABS: 20.0000 mg | ORAL_TABLET | Freq: Every day | ORAL | Status: DC

## 2015-06-11 NOTE — Progress Notes (Signed)
06/11/2015 [name]   08-11-1945  [id number]  Primary Physician Thersa Salt, DO Primary Cardiologist: Dr. Acie Fredrickson   Reason for Visit/CC:  Chest Pain  HPI:  70 y/o male followed by Dr. Acie Fredrickson who presents to [facility] today with a complaint of recent chest discomfort. His PMH is significant for PAF, prior stroke treated with TPA with mild residual left sided weakness, chronic anticoagulation therapy with Coumadin and h/o CAD s/p PCI to the LCx in 2007. He recently moved to [facility] from [place] and established care with Dr. Acie Fredrickson 01/2015. He is now requesting to transfer care to our [place] office, as it is closer for him.   Today in clinic, he notes recent chest discomfort off and on. First started 2 weeks ago.  He notes left sided chest pressure radiating to his left arm. Occurs at rest. Not worse with exertion or meals. No associated dyspnea, palpitations, syncope/ near syncope. He notes that his recent discomfort is not like his angina when he had his MI in 2007 (more tight sensation). He reports full medication compliance. He seems to think his chest discomfort is related to higher than normal blood pressures. He notes SBPs in the 150s at home, likely due to dietary indiscretion with sodium/ fast food. His PCP instructed him to increase his HCTZ to 25 mg. Since that time, his BP has improved as well as his CP. He notes no recurrence in CP since increasing HCTZ 1 week ago.   His EKG shows NSR. No significant change compared to his prior EKG in 01/2015.    Current Outpatient Prescriptions  Medication Sig Dispense Refill  . aspirin 81 MG tablet Take 81 mg by mouth daily.    Marland Kitchen atorvastatin (LIPITOR) 20 MG tablet Take 20 mg by mouth daily.     . bisoprolol (ZEBETA) 5 MG tablet Take 1 tablet (5 mg total) by mouth daily. 90 tablet 4  . Cholecalciferol (VITAMIN D) 2000 UNITS CAPS Take 2,000 Units by mouth 2 (two) times daily.     Marland Kitchen dicyclomine (BENTYL) 10 MG capsule Take 1 capsule (10 mg total) by  mouth 2 (two) times daily. 90 capsule 2  . escitalopram (LEXAPRO) 10 MG tablet Take 0.5 tablets (5 mg total) by mouth daily. 90 tablet 3  . hydrochlorothiazide (MICROZIDE) 12.5 MG capsule Take 2 capsules (25 mg total) by mouth daily. 180 capsule 1  . lisinopril (PRINIVIL,ZESTRIL) 20 MG tablet Take 20 mg by mouth 2 (two) times daily.    . ranitidine (ZANTAC) 150 MG tablet Take 150 mg by mouth 2 (two) times daily.    . vitamin B-12 (CYANOCOBALAMIN) 1000 MCG tablet Take 1,000 mcg by mouth daily.    Marland Kitchen warfarin (COUMADIN) 5 MG tablet Take as directed by the Coumadin clinic     No current facility-administered medications for this visit.    Allergies  Allergen Reactions  . Bactrim [Sulfamethoxazole-Trimethoprim] Anaphylaxis    Ulcers  . Statins     Crestor- Severe muscle spasms    Social History   Social History  . Marital Status: Married    Spouse Name: N/A  . Number of Children: N/A  . Years of Education: N/A   Occupational History  . Not on file.   Social History Main Topics  . Smoking status: Former Research scientist (life sciences)  . Smokeless tobacco: Not on file  . Alcohol Use: Yes  . Drug Use: No  . Sexual Activity: Not on file   Other Topics Concern  . Not on file  Social History Narrative     Review of Systems: General: negative for chills, fever, night sweats or weight changes.  Cardiovascular: negative for chest pain, dyspnea on exertion, edema, orthopnea, palpitations, paroxysmal nocturnal dyspnea or shortness of breath Dermatological: negative for rash Respiratory: negative for cough or wheezing Urologic: negative for hematuria Abdominal: negative for nausea, vomiting, diarrhea, bright red blood per rectum, melena, or hematemesis Neurologic: negative for visual changes, syncope, or dizziness All other systems reviewed and are otherwise negative except as noted above.    Blood pressure 124/88, pulse 60, height 6\' 1"  (1.854 m), weight 187 lb 9.6 oz (85.095 kg), SpO2 98 %.    General appearance: alert, cooperative and no distress Neck: no carotid bruit and no JVD Lungs: clear to auscultation bilaterally Heart: regular rate and rhythm, S1, S2 normal, no murmur, click, rub or gallop Extremities: no LEE Pulses: 2+ and symmetric Skin: warm and dry Neurologic: Grossly normal  EKG NSR. No significant change compared to prior EKG.   ASSESSMENT AND PLAN:   1. Chest Pain/CAD: Patient with h/o CAD s/p PCI to LCx in 2007 while living in [place]. He has not had a f/u cath or stress test since that time. He now presents with mixed features of typical/ atypical pain. EKG today shows NSR w/ NSTW abnormalities, but no significant change compared to prior EKG 01/2015. He is currently CP free. HR and BP are both stable with BP well controlled. Given his history and recent symptoms, will arrange for a Lexiscan Myoview for risk stratification/ rule-out ischemia. Continue ASA, statin, BB and ACE-I.   2. PAF: NSR on telemetry. HR well controlled on BB. He is on chronic coumadin therapy.   3. Chronic Anticoagulation: on Coumadin for PAF + h/o CVA. INRs are followed reguarlly by his PCP. He denies any abnormal bleeding.   4. H/o CVA: denies any new issues. On chronic Coumadin.   PLAN  Patient has been seen by Dr. Acie Fredrickson once. He established care with our practice 01/2015 after moving from [place]. He is requesting to transfer care to our [place] office, as it is closer to where he lives. We will arrange f/u with Dr. Rockey Situ.   Lyda Jester PA-C 06/11/2015 11:57 AM

## 2015-06-11 NOTE — Patient Instructions (Signed)
Medication Instructions:  Your physician recommends that you continue on your current medications as directed. Please refer to the Current Medication list given to you today.   Labwork: BMET TODAY  Testing/Procedures: Your physician has requested that you have a lexiscan myoview. For further information please visit [email or web address]. Please follow instruction sheet, as given.   Follow-Up: 08/2015 WITH DR. Acie Fredrickson OR DR. Rockey Situ IN THE [place] OFFICE  Any Other Special Instructions Will Be Listed Below (If Applicable).   If you need a refill on your cardiac medications before your next appointment, please call your pharmacy.

## 2015-06-11 NOTE — Telephone Encounter (Signed)
Patient given detailed instructions per Myocardial Perfusion Study Information Sheet for the test on 06/13/15 at 0745. Patient notified to arrive 15 minutes early and that it is imperative to arrive on time for appointment to keep from having the test rescheduled.  If you need to cancel or reschedule your appointment, please call the office within 24 hours of your appointment. Failure to do so may result in a cancellation of your appointment, and a $50 no show fee. Patient verbalized understanding.Talayla Doyel, Ranae [place]ms

## 2015-06-11 NOTE — Telephone Encounter (Signed)
Pt pharmacy called about not having any more refills on atorvastatin (LIPITOR) 20 MG tablet. Pt was just seen on Friday 06/07/2015. Pharmacy is [id number] - [place], [place]. Thank You!

## 2015-06-13 ENCOUNTER — Telehealth: Payer: Self-pay | Admitting: Family Medicine

## 2015-06-13 ENCOUNTER — Ambulatory Visit (HOSPITAL_COMMUNITY): Payer: PPO | Attending: Cardiovascular Disease

## 2015-06-13 ENCOUNTER — Telehealth: Payer: Self-pay | Admitting: *Deleted

## 2015-06-13 DIAGNOSIS — I10 Essential (primary) hypertension: Secondary | ICD-10-CM | POA: Diagnosis not present

## 2015-06-13 DIAGNOSIS — R079 Chest pain, unspecified: Secondary | ICD-10-CM | POA: Diagnosis not present

## 2015-06-13 LAB — MYOCARDIAL PERFUSION IMAGING
CHL CUP NUCLEAR SDS: 4
CHL CUP RESTING HR STRESS: 51 {beats}/min
CSEPPHR: 72 {beats}/min
LHR: 0.28
LV dias vol: 90 mL
LVSYSVOL: 36 mL
SRS: 2
SSS: 6
TID: 0.98

## 2015-06-13 MED ORDER — TECHNETIUM TC 99M SESTAMIBI GENERIC - CARDIOLITE
32.4000 | Freq: Once | INTRAVENOUS | Status: AC | PRN
  Administered 2015-06-13: 32 via INTRAVENOUS

## 2015-06-13 MED ORDER — REGADENOSON 0.4 MG/5ML IV SOLN
0.4000 mg | Freq: Once | INTRAVENOUS | Status: AC
  Administered 2015-06-13: 0.4 mg via INTRAVENOUS

## 2015-06-13 MED ORDER — TECHNETIUM TC 99M SESTAMIBI GENERIC - CARDIOLITE
10.6000 | Freq: Once | INTRAVENOUS | Status: AC | PRN
  Administered 2015-06-13: 11 via INTRAVENOUS

## 2015-06-13 NOTE — Telephone Encounter (Signed)
This morning BP 149/85.   The beginning of the week 120/80-85

## 2015-06-13 NOTE — Telephone Encounter (Signed)
-----   Message from Consuelo Pandy, [place] sent at 06/13/2015  3:40 PM EST ----- BMP is normal. We looked and kidneys and electrolytes and all are normal.

## 2015-06-13 NOTE — Telephone Encounter (Signed)
Continue current therapy as long as BP are not consistently elevated >140/90.

## 2015-06-13 NOTE — Telephone Encounter (Signed)
Called pt, per Ellen Henri, PA-C, to advise him that his labs were normal as well as his electrolytes and kidneys.  Pt verbalized understanding.

## 2015-06-14 NOTE — Telephone Encounter (Signed)
Patient gave verbal understanding ?

## 2015-06-27 ENCOUNTER — Other Ambulatory Visit (INDEPENDENT_AMBULATORY_CARE_PROVIDER_SITE_OTHER): Payer: PPO

## 2015-06-27 DIAGNOSIS — I4891 Unspecified atrial fibrillation: Secondary | ICD-10-CM

## 2015-06-27 LAB — PROTIME-INR
INR: 2.3 ratio — AB (ref 0.8–1.0)
PROTHROMBIN TIME: 24.2 s — AB (ref 9.6–13.1)

## 2015-07-01 ENCOUNTER — Other Ambulatory Visit: Payer: Self-pay | Admitting: Family Medicine

## 2015-07-01 ENCOUNTER — Telehealth: Payer: Self-pay | Admitting: Family Medicine

## 2015-07-01 NOTE — Telephone Encounter (Signed)
[facility] about the [facility] not [facility] due to needing pre-approval lisinopril (PRINIVIL,ZESTRIL) 20 MG tablet. Pharmacy is [id number] - [place], [place] [address]. Pt was told to call back on BP follow up 125/80 today. Call pt @ [phone]. Thank you!

## 2015-07-02 NOTE — Telephone Encounter (Signed)
Pt called. I gave him the message about his Rx. Pt is going to check with his pharmacy.

## 2015-07-02 NOTE — Telephone Encounter (Signed)
LVTCB. Just need to know if his insurance sent a prior authorization for his medications.

## 2015-07-02 NOTE — Telephone Encounter (Signed)
Pt did not need prior auth, pt needed a Rx. Called the pharmacy and is taken care of

## 2015-07-03 ENCOUNTER — Other Ambulatory Visit: Payer: Self-pay

## 2015-07-03 MED ORDER — RANITIDINE HCL 150 MG PO TABS: 150.0000 mg | ORAL_TABLET | Freq: Two times a day (BID) | ORAL | Status: DC

## 2015-07-29 ENCOUNTER — Other Ambulatory Visit (INDEPENDENT_AMBULATORY_CARE_PROVIDER_SITE_OTHER): Payer: PPO

## 2015-07-29 DIAGNOSIS — I4891 Unspecified atrial fibrillation: Secondary | ICD-10-CM | POA: Diagnosis not present

## 2015-07-29 LAB — PROTIME-INR
INR: 2 ratio — AB (ref 0.8–1.0)
PROTHROMBIN TIME: 21.3 s — AB (ref 9.6–13.1)

## 2015-08-09 ENCOUNTER — Ambulatory Visit (INDEPENDENT_AMBULATORY_CARE_PROVIDER_SITE_OTHER): Payer: PPO | Admitting: Cardiovascular Disease

## 2015-08-09 ENCOUNTER — Encounter: Payer: Self-pay | Admitting: Cardiovascular Disease

## 2015-08-09 VITALS — BP 132/84 | HR 64 | Ht 73.0 in | Wt 188.8 lb

## 2015-08-09 DIAGNOSIS — R079 Chest pain, unspecified: Secondary | ICD-10-CM

## 2015-08-09 DIAGNOSIS — I10 Essential (primary) hypertension: Secondary | ICD-10-CM

## 2015-08-09 DIAGNOSIS — I480 Paroxysmal atrial fibrillation: Secondary | ICD-10-CM

## 2015-08-09 DIAGNOSIS — E785 Hyperlipidemia, unspecified: Secondary | ICD-10-CM

## 2015-08-09 DIAGNOSIS — I2510 Atherosclerotic heart disease of native coronary artery without angina pectoris: Secondary | ICD-10-CM | POA: Diagnosis not present

## 2015-08-09 DIAGNOSIS — Z7189 Other specified counseling: Secondary | ICD-10-CM | POA: Diagnosis not present

## 2015-08-09 MED ORDER — EZETIMIBE 10 MG PO TABS
10.0000 mg | ORAL_TABLET | Freq: Every day | ORAL | Status: DC
Start: 2015-08-09 — End: 2016-02-11

## 2015-08-09 MED ORDER — NITROGLYCERIN 0.4 MG SL SUBL: 0.4000 mg | SUBLINGUAL_TABLET | SUBLINGUAL | Status: DC | PRN

## 2015-08-09 NOTE — Assessment & Plan Note (Signed)
He denies any recent episodes of chest pain Recent stress test discussed with him showing no ischemia Recommended he call us if he has any recurrent symptoms   Total encounter time more than 25 minutes  Greater than 50% was spent in counseling and coordination of care with the patient

## 2015-08-09 NOTE — Assessment & Plan Note (Addendum)
Blood pressure is well controlled on today's visit. No changes made to the medications. 

## 2015-08-09 NOTE — Assessment & Plan Note (Signed)
I suspect he has a history of paroxysmal atrial fibrillation Prior stroke requiring TPA, patient reports additional stroke in 2015 noted on HEAD SCAN (was on warfarin at time). Denies any tachycardia or palpitations concerning for arrhythmia.  need to continue warfarin

## 2015-08-09 NOTE — Assessment & Plan Note (Signed)
Cholesterol above goal given history of coronary artery disease. Unable to tolerate Crestor or higher dose Lipitor secondary to myalgias Recommended he start Zetia 10 mill grams daily when this goes generic next month

## 2015-08-09 NOTE — Progress Notes (Signed)
Patient ID: [name], male    DOB: 09-06-1945, 70 y.o.   MRN: [id number]  HPI Comments: 70 y/o male with a history of  PAF, prior stroke treated with TPA with mild residual left sided weakness, chronic anticoagulation therapy with Coumadin and h/o CAD s/p PCI to the LCx in 2007. He reports mini stroke in 2015 seen on PET scan, did not have symptoms and was on Coumadin at the time.  Recently seen in our clinic for chest discomfort, had a stress test Pharmacologic Myoview showed no ischemia  In follow-up today, he reports his blood pressure has been relatively well-controlled on his current medication regimen He is concerned as INR is getting low, 2.0 recently We talked about hIS  "possible silent stroke in 2015" Wife reports that he drinks wine every night at least 2 glasses  He does report having myalgias on Crestor and higher dose Lipitor Recent total cholesterol 174, LDL 93  EKG on today's visit shows normal sinus rhythm with rate 64 bpm, no significant ST or T-wave changes  His EKG shows NSR. No significant change compared to his prior EKG in 01/2015.     Allergies  Allergen Reactions  . Bactrim [Sulfamethoxazole-Trimethoprim] Anaphylaxis    Ulcers  . Statins     Crestor- Severe muscle spasms    Current Outpatient Prescriptions on File Prior to Visit  Medication Sig Dispense Refill  . aspirin 81 MG tablet Take 81 mg by mouth daily.    Marland Kitchen atorvastatin (LIPITOR) 20 MG tablet Take 1 tablet (20 mg total) by mouth daily. 90 tablet 1  . bisoprolol (ZEBETA) 5 MG tablet Take 1 tablet (5 mg total) by mouth daily. 90 tablet 4  . Cholecalciferol (VITAMIN D) 2000 UNITS CAPS Take 2,000 Units by mouth 2 (two) times daily.     Marland Kitchen dicyclomine (BENTYL) 10 MG capsule Take 1 capsule (10 mg total) by mouth 2 (two) times daily. 90 capsule 2  . escitalopram (LEXAPRO) 10 MG tablet Take 0.5 tablets (5 mg total) by mouth daily. 90 tablet 3  . hydrochlorothiazide (MICROZIDE) 12.5 MG capsule Take 2  capsules (25 mg total) by mouth daily. 180 capsule 1  . lisinopril (PRINIVIL,ZESTRIL) 20 MG tablet TAKE 1 TABLET BY MOUTH TWICE A DAY 60 tablet 2  . ranitidine (ZANTAC) 150 MG tablet Take 1 tablet (150 mg total) by mouth 2 (two) times daily. 180 tablet 11  . vitamin B-12 (CYANOCOBALAMIN) 1000 MCG tablet Take 1,000 mcg by mouth daily.    Marland Kitchen warfarin (COUMADIN) 5 MG tablet Take as directed by the Coumadin clinic     No current facility-administered medications on file prior to visit.    Past Medical History  Diagnosis Date  . A-fib ([place])   . Coronary artery disease     Hx of MI; S/P PCI  . Hyperlipidemia   . Hypertension   . Stroke ([place])   . GERD (gastroesophageal reflux disease)   . IBS (irritable bowel syndrome)   . PNA (pneumonia)   . Depression   . IBS (irritable bowel syndrome)   . Skin cancer   . BPH (benign prostatic hyperplasia)     Past Surgical History  Procedure Laterality Date  . Thyroidectomy, partial    . Cardiac catheterization  2006    X1 STENT  . Adenoidectomy  1951    Social History  reports that he has quit smoking. He does not have any smokeless tobacco history on file. He reports that he drinks alcohol. He  reports that he does not use illicit drugs.  Family History family history includes Arthritis in his mother; Cancer in his maternal aunt and mother; Heart Problems in his father; Heart disease in his father, paternal grandfather, and paternal grandmother; Hypertension in his father; Mental illness in his mother; Stroke in his maternal grandmother and paternal uncle; Sudden death in his maternal grandfather.    Review of Systems  Constitutional: Negative.   Eyes: Negative.   Respiratory: Negative.   Cardiovascular: Negative.   Gastrointestinal: Negative.   Endocrine: Negative.   Musculoskeletal: Negative.   Neurological: Negative.   Hematological: Negative.   Psychiatric/Behavioral: Negative.   All other systems reviewed and are  negative.   BP 132/84 mmHg  Pulse 64  Ht 6\' 1"  (1.854 m)  Wt 188 lb 12 oz (85.616 kg)  BMI 24.91 kg/m2  Physical Exam  Constitutional: He is oriented to person, place, and time. He appears well-developed and well-nourished.  HENT:  Head: Normocephalic.  Nose: Nose normal.  Mouth/Throat: Oropharynx is clear and moist.  Eyes: Conjunctivae are normal. Pupils are equal, round, and reactive to light.  Neck: Normal range of motion. Neck supple. No JVD present.  Cardiovascular: Normal rate, regular rhythm, normal heart sounds and intact distal pulses.  Exam reveals no gallop and no friction rub.   No murmur heard. Pulmonary/Chest: Effort normal and breath sounds normal. No respiratory distress. He has no wheezes. He has no rales. He exhibits no tenderness.  Abdominal: Soft. Bowel sounds are normal. He exhibits no distension. There is no tenderness.  Musculoskeletal: Normal range of motion. He exhibits no edema or tenderness.  Lymphadenopathy:    He has no cervical adenopathy.  Neurological: He is alert and oriented to person, place, and time. Coordination normal.  Skin: Skin is warm and dry. No rash noted. No erythema.  Psychiatric: He has a normal mood and affect. His behavior is normal. Judgment and thought content normal.

## 2015-08-09 NOTE — Assessment & Plan Note (Signed)
Currently with no symptoms of angina. No further workup at this time. Continue current medication regimen. 

## 2015-08-09 NOTE — Assessment & Plan Note (Signed)
Long discussion concerning where his INR should run Given he may have had an event while on warfarin in 2015, would aim for 2.5 up to 3 He is in agreement with this, we'll discuss with Dr. Lacinda Axon

## 2015-08-09 NOTE — Patient Instructions (Signed)
You are doing well.  Consider starting zetia one a day when this goes generic in April  I will talk with Dr. Lacinda Axon about raising the INR up to 2.5 to 3  Please call us if you have new issues that need to be addressed before your next appt.  Your physician wants you to follow-up in: 6 months.  You will receive a reminder letter in the mail two months in advance. If you don't receive a letter, please call our office to schedule the follow-up appointment.

## 2015-08-17 ENCOUNTER — Other Ambulatory Visit: Payer: Self-pay | Admitting: Family Medicine

## 2015-08-19 NOTE — Progress Notes (Signed)
I will defer to you to manage if that is ok. thx Esmond Plants

## 2015-08-21 ENCOUNTER — Other Ambulatory Visit: Payer: Self-pay | Admitting: Family Medicine

## 2015-08-21 DIAGNOSIS — I4891 Unspecified atrial fibrillation: Secondary | ICD-10-CM

## 2015-08-22 ENCOUNTER — Other Ambulatory Visit (INDEPENDENT_AMBULATORY_CARE_PROVIDER_SITE_OTHER): Payer: PPO

## 2015-08-22 DIAGNOSIS — I4891 Unspecified atrial fibrillation: Secondary | ICD-10-CM | POA: Diagnosis not present

## 2015-08-22 LAB — PROTIME-INR
INR: 3 ratio — AB (ref 0.8–1.0)
Prothrombin Time: 32.5 s — ABNORMAL HIGH (ref 9.6–13.1)

## 2015-08-23 ENCOUNTER — Telehealth: Payer: Self-pay | Admitting: Family Medicine

## 2015-08-23 NOTE — Telephone Encounter (Signed)
Pt is calling about lab results. Please call him at [phone].

## 2015-08-23 NOTE — Telephone Encounter (Signed)
Notified pt of test results

## 2015-08-27 ENCOUNTER — Telehealth: Payer: Self-pay | Admitting: Family Medicine

## 2015-08-27 ENCOUNTER — Other Ambulatory Visit: Payer: PPO

## 2015-09-10 ENCOUNTER — Telehealth: Payer: Self-pay | Admitting: *Deleted

## 2015-09-10 ENCOUNTER — Other Ambulatory Visit (INDEPENDENT_AMBULATORY_CARE_PROVIDER_SITE_OTHER): Payer: PPO

## 2015-09-10 DIAGNOSIS — Z7901 Long term (current) use of anticoagulants: Secondary | ICD-10-CM

## 2015-09-10 LAB — PROTIME-INR
INR: 2.1 ratio — ABNORMAL HIGH (ref 0.8–1.0)
Prothrombin Time: 22.1 s — ABNORMAL HIGH (ref 9.6–13.1)

## 2015-09-10 NOTE — Telephone Encounter (Signed)
INR; Afib

## 2015-09-10 NOTE — Telephone Encounter (Signed)
Labs and dx?  

## 2015-09-24 ENCOUNTER — Other Ambulatory Visit (INDEPENDENT_AMBULATORY_CARE_PROVIDER_SITE_OTHER): Payer: PPO

## 2015-09-24 DIAGNOSIS — I4891 Unspecified atrial fibrillation: Secondary | ICD-10-CM | POA: Diagnosis not present

## 2015-09-24 LAB — PROTIME-INR
INR: 3.1 ratio — ABNORMAL HIGH (ref 0.8–1.0)
Prothrombin Time: 33.5 s — ABNORMAL HIGH (ref 9.6–13.1)

## 2015-09-26 ENCOUNTER — Other Ambulatory Visit: Payer: Self-pay | Admitting: Family Medicine

## 2015-10-23 ENCOUNTER — Other Ambulatory Visit (INDEPENDENT_AMBULATORY_CARE_PROVIDER_SITE_OTHER): Payer: PPO

## 2015-10-23 ENCOUNTER — Ambulatory Visit (INDEPENDENT_AMBULATORY_CARE_PROVIDER_SITE_OTHER): Payer: PPO

## 2015-10-23 VITALS — BP 126/82 | HR 60 | Temp 98.2°F | Resp 14 | Ht 77.0 in | Wt 185.1 lb

## 2015-10-23 DIAGNOSIS — Z7901 Long term (current) use of anticoagulants: Secondary | ICD-10-CM

## 2015-10-23 DIAGNOSIS — Z0000 Encounter for general adult medical examination without abnormal findings: Secondary | ICD-10-CM

## 2015-10-23 DIAGNOSIS — I10 Essential (primary) hypertension: Secondary | ICD-10-CM | POA: Diagnosis not present

## 2015-10-23 DIAGNOSIS — I25759 Atherosclerosis of native coronary artery of transplanted heart with unspecified angina pectoris: Secondary | ICD-10-CM | POA: Diagnosis not present

## 2015-10-23 DIAGNOSIS — Z87898 Personal history of other specified conditions: Secondary | ICD-10-CM

## 2015-10-23 DIAGNOSIS — E785 Hyperlipidemia, unspecified: Secondary | ICD-10-CM

## 2015-10-23 LAB — PROTIME-INR
INR: 3.4 ratio — AB (ref 0.8–1.0)
Prothrombin Time: 37.4 s — ABNORMAL HIGH (ref 9.6–13.1)

## 2015-10-23 NOTE — Patient Instructions (Addendum)
  [name] , Thank you for taking time to come for your Medicare Wellness Visit. I appreciate your ongoing commitment to your health goals. Please review the following plan we discussed and let me know if I can assist you in the future.   Follow up with Dr. Lacinda Little as needed.    Return in August for fasting labs and September for physical.   This is a list of the screening recommended for you and due dates:  Health Maintenance  Topic Date Due  .  Hepatitis C: One time screening is recommended by Center for Disease Control  (CDC) for  adults born from [birth year] through 1965.   09/09/1945  . Flu Shot  01/07/2016  . Tetanus Vaccine  07/07/2018  . Colon Cancer Screening  11/09/2022  . Shingles Vaccine  Addressed  . Pneumonia vaccines  Addressed

## 2015-10-23 NOTE — Progress Notes (Signed)
Subjective:   [name] is a 70 y.o. male who presents for an Initial Medicare Annual Wellness Visit.  Review of Systems  No ROS.  Medicare Wellness Visit.  Cardiac Risk Factors include: advanced age (>44men, >58 women);male gender;hypertension    Objective:    Today's Vitals   10/23/15 1026  BP: 126/82  Pulse: 60  Temp: 98.2 F (36.8 C)  TempSrc: Oral  Resp: 14  Height: 6\' 5"  (1.956 m)  Weight: 185 lb 1.9 oz (83.97 kg)  SpO2: 97%   Body mass index is 21.95 kg/(m^2).  Current Medications (verified) Outpatient Encounter Prescriptions as of 10/23/2015  Medication Sig  . aspirin 81 MG tablet Take 81 mg by mouth daily.  Marland Kitchen atorvastatin (LIPITOR) 20 MG tablet Take 1 tablet (20 mg total) by mouth daily.  . bisoprolol (ZEBETA) 5 MG tablet Take 1 tablet (5 mg total) by mouth daily.  . Cholecalciferol (VITAMIN D) 2000 UNITS CAPS Take 2,000 Units by mouth 2 (two) times daily.   Marland Kitchen co-enzyme Q-10 30 MG capsule Take 400 mg by mouth daily.  Marland Kitchen dicyclomine (BENTYL) 10 MG capsule TAKE 1 CAPSULE (10 MG TOTAL) BY MOUTH 2 (TWO) TIMES DAILY.  Marland Kitchen escitalopram (LEXAPRO) 10 MG tablet Take 0.5 tablets (5 mg total) by mouth daily.  Marland Kitchen ezetimibe (ZETIA) 10 MG tablet Take 1 tablet (10 mg total) by mouth daily.  . hydrochlorothiazide (MICROZIDE) 12.5 MG capsule Take 2 capsules (25 mg total) by mouth daily.  Marland Kitchen lisinopril (PRINIVIL,ZESTRIL) 20 MG tablet TAKE 1 TABLET BY MOUTH TWICE A DAY  . nitroGLYCERIN (NITROSTAT) 0.4 MG SL tablet Place 1 tablet (0.4 mg total) under the tongue every 5 (five) minutes as needed for chest pain.  . ranitidine (ZANTAC) 150 MG tablet Take 1 tablet (150 mg total) by mouth 2 (two) times daily.  . vitamin B-12 (CYANOCOBALAMIN) 1000 MCG tablet Take 1,000 mcg by mouth daily.  Marland Kitchen warfarin (COUMADIN) 5 MG tablet Take as directed by the Coumadin clinic   No facility-administered encounter medications on file as of 10/23/2015.    Allergies (verified) Bactrim and Statins    History: Past Medical History  Diagnosis Date  . A-fib ([place])   . Coronary artery disease     Hx of MI; S/P PCI  . Hyperlipidemia   . Hypertension   . Stroke ([place])   . GERD (gastroesophageal reflux disease)   . IBS (irritable bowel syndrome)   . PNA (pneumonia)   . Depression   . IBS (irritable bowel syndrome)   . Skin cancer   . BPH (benign prostatic hyperplasia)    Past Surgical History  Procedure Laterality Date  . Thyroidectomy, partial    . Cardiac catheterization  2006    X1 STENT  . Adenoidectomy  1951   Family History  Problem Relation Age of Onset  . Heart Problems Father   . Heart disease Father   . Hypertension Father   . Mental illness Mother   . Arthritis Mother   . Cancer Mother     ovarian  . Stroke Paternal Uncle   . Heart disease Paternal Grandfather   . Cancer Maternal Aunt   . Stroke Maternal Grandmother   . Sudden death Maternal Grandfather   . Heart disease Paternal Grandmother    Social History   Occupational History  . Not on file.   Social History Main Topics  . Smoking status: Former Research scientist (life sciences)  . Smokeless tobacco: Not on file  . Alcohol Use: 1.2 - 1.8  oz/week    2-3 Glasses of wine per week     Comment: Wine daily with dinner  . Drug Use: No  . Sexual Activity: No   Tobacco Counseling Counseling given: Not Answered   Activities of Daily Living In your present state of health, do you have any difficulty performing the following activities: 10/23/2015  Hearing? N  Vision? N  Difficulty concentrating or making decisions? Y  Walking or climbing stairs? Y  Dressing or bathing? N  Doing errands, shopping? N  Preparing Food and eating ? N  Using the Toilet? N  In the past six months, have you accidently leaked urine? Y  Do you have problems with loss of bowel control? Y  Managing your Medications? N  Managing your Finances? N  Housekeeping or managing your Housekeeping? N    Immunizations and Health Maintenance Immunization  History  Administered Date(s) Administered  . Influenza,inj,Quad PF,36+ Mos 02/08/2015  . Influenza-Unspecified 03/22/2010, 03/10/2011, 02/23/2012, 02/28/2013, 03/15/2014  . Pneumococcal Conjugate-13 09/04/2014  . Pneumococcal Polysaccharide-23 07/07/2004, 07/01/2011, 02/07/2013  . Tdap 07/07/2008  . Zoster 02/12/2011   Health Maintenance Due  Topic Date Due  . Hepatitis C Screening  [birth year]    Patient Care Team: Coral Spikes, DO as PCP - General (Family Medicine) Minna Merritts, MD as Consulting Physician (Cardiology)  Indicate any recent Medical Services you may have received from other than [facility] providers in the past year (date may be approximate).    Assessment:   This is a routine wellness examination for [name]. The goal of the wellness visit is to assist the patient how to close the gaps in care and create a preventative care plan for the patient.   Taking VIT D as appropriate/Osteoporosis risk reviewed.   Medications reviewed; taking without issues or barriers.  Safety issues reviewed; smoke detectors in the home. Firearms are locked in a secure place in the home. Wears seatbelts when driving or riding with others. No violence in the home.  No identified risk were noted; The patient was oriented x 3; appropriate in dress and manner and no objective failures at ADL's or IADL's.   Cva-stable and followed by PCP.  Patient Concerns:  Requests prostate as well as skin CA examination at upcoming CPE.  Future labs placed.  No other issues to address at this time.  Follow up with PCP as needed.  Hearing/Vision screen  Visual Acuity Screening   Right eye Left eye Both eyes  Without correction:     With correction:   20/20  Comments: Recently moved to the area.  Does not currently have an eye doctor. Wears glasses daily.  Vision with glasses 20/20.  Hearing Screening Comments: Passed the whisper test.   Dietary issues and exercise activities discussed: Current  Exercise Habits: Home exercise routine, Type of exercise: walking, Time (Minutes): 20, Frequency (Times/Week): 7, Weekly Exercise (Minutes/Week): 140, Intensity: Mild  Goals    . Healthy Lifestyle     Stay hydrated and drink plenty of fluids. Low carb foods.  Lean meats and vegetables. Stay active and continuing walking for exercise, as tolerated.      Depression Screen PHQ 2/9 Scores 10/23/2015 02/08/2015  PHQ - 2 Score 0 0    Fall Risk Fall Risk  10/23/2015 02/08/2015  Falls in the past year? No No    Cognitive Function: MMSE - Mini Mental State Exam 10/23/2015  Orientation to time 5  Orientation to Place 5  Registration 3  Attention/ Calculation 5  Recall 0  Recall-comments Hx of stroke.  Difficulty remembering.  Language- name 2 objects 2  Language- repeat 1  Language- follow 3 step command 3  Language- read & follow direction 1  Write a sentence 1  Copy design 1  Total score 27    Screening Tests Health Maintenance  Topic Date Due  . Hepatitis C Screening  November 13, 1945  . INFLUENZA VACCINE  01/07/2016  . TETANUS/TDAP  07/07/2018  . COLONOSCOPY  11/09/2022  . ZOSTAVAX  Addressed  . PNA vac Low Risk Adult  Addressed        Plan:   End of life planning; Advance aging; Advanced directives discussed. No current Health Care Power of Attorney or Living Will.  Educational material provided to help him start the conversation with his family.  Advanced directive short forms requested upon completion.  Time spent discussing HCPOA/Living Will is 20 minutes.  Encouraged to follow up with PCP as needed.   During the course of the visit [name] was educated and counseled about the following appropriate screening and preventive services:   Vaccines to include Pneumoccal, Influenza, Hepatitis B, Td, Zostavax, HCV  Electrocardiogram  Colorectal cancer screening  Cardiovascular disease screening  Diabetes screening  Glaucoma screening  Nutrition counseling  Prostate cancer  screening  Smoking cessation counseling  Patient Instructions (the written plan) were given to the patient.   Varney Biles, LPN   [id number]

## 2015-10-24 ENCOUNTER — Other Ambulatory Visit: Payer: PPO

## 2015-11-13 NOTE — Progress Notes (Signed)
I have reviewed the note and agree.  [place]

## 2015-11-21 ENCOUNTER — Other Ambulatory Visit (INDEPENDENT_AMBULATORY_CARE_PROVIDER_SITE_OTHER): Payer: PPO

## 2015-11-21 DIAGNOSIS — Z79899 Other long term (current) drug therapy: Secondary | ICD-10-CM | POA: Diagnosis not present

## 2015-11-21 DIAGNOSIS — Z5181 Encounter for therapeutic drug level monitoring: Secondary | ICD-10-CM

## 2015-11-21 LAB — PROTIME-INR
INR: 2.1 ratio — ABNORMAL HIGH (ref 0.8–1.0)
Prothrombin Time: 22.9 s — ABNORMAL HIGH (ref 9.6–13.1)

## 2015-11-22 ENCOUNTER — Other Ambulatory Visit: Payer: Self-pay | Admitting: Family Medicine

## 2015-11-22 MED ORDER — WARFARIN SODIUM 1 MG PO TABS: ORAL_TABLET | ORAL | Status: DC

## 2015-11-25 ENCOUNTER — Ambulatory Visit (INDEPENDENT_AMBULATORY_CARE_PROVIDER_SITE_OTHER): Payer: PPO | Admitting: Family Medicine

## 2015-11-25 ENCOUNTER — Encounter: Payer: Self-pay | Admitting: Family Medicine

## 2015-11-25 VITALS — BP 120/84 | HR 70 | Temp 98.4°F | Ht 73.0 in | Wt 183.2 lb

## 2015-11-25 DIAGNOSIS — T148 Other injury of unspecified body region: Secondary | ICD-10-CM

## 2015-11-25 DIAGNOSIS — W57XXXA Bitten or stung by nonvenomous insect and other nonvenomous arthropods, initial encounter: Secondary | ICD-10-CM

## 2015-11-25 DIAGNOSIS — H43399 Other vitreous opacities, unspecified eye: Secondary | ICD-10-CM | POA: Diagnosis not present

## 2015-11-25 MED ORDER — DOXYCYCLINE HYCLATE 100 MG PO TABS: 100.0000 mg | ORAL_TABLET | Freq: Two times a day (BID) | ORAL | Status: DC

## 2015-11-25 NOTE — Progress Notes (Signed)
Subjective:  Patient ID: [name], male    DOB: 1946-01-05  Age: 69 y.o. MRN: [id number]  CC: Fever, myalgias, headache, fatigue  HPI:  70 year male with a complicated PMH including Afib, CAD, HTN, HLD, CVA presents with the above complaints.  Patient states that he has not been feeling well for the past several days. Patient had a recent fever Thursday (100.6). He's also been experiencing myalgias, headache, fatigue, decreased appetite, chills. He reports a tick bite 2 weeks ago. No cough, cold, runny nose. No reports of chest pain or shortness of breath. No reported GI symptoms other than decreased appetite. He denies recent illness or infection. No known exacerbating or relieving factors. Symptoms are moderate to severe. No other complaints at this time.  Social Hx   Social History   Social History  . Marital Status: Married    Spouse Name: N/A  . Number of Children: N/A  . Years of Education: N/A   Social History Main Topics  . Smoking status: Former Research scientist (life sciences)  . Smokeless tobacco: None  . Alcohol Use: 1.2 - 1.8 oz/week    2-3 Glasses of wine per week     Comment: Wine daily with dinner  . Drug Use: No  . Sexual Activity: No   Other Topics Concern  . None   Social History Narrative   Review of Systems  Constitutional: Positive for fever, chills, appetite change and fatigue.  HENT: Negative.   Respiratory: Negative.   Cardiovascular: Negative.    Objective:  BP 120/84 mmHg  Pulse 70  Temp(Src) 98.4 F (36.9 C) (Oral)  Ht 6\' 1"  (1.854 m)  Wt 183 lb 3 oz (83.093 kg)  BMI 24.17 kg/m2  SpO2 96%  BP/Weight 11/25/2015 0000000 [id number]  Systolic BP 123456 [id number]  Diastolic BP 84 82 84  Wt. (Lbs) 183.19 185.12 188.75  BMI 24.17 21.95 24.91   Physical Exam  Constitutional: He is oriented to person, place, and time. He appears well-developed. No distress.  HENT:  Head: Normocephalic and atraumatic.  Mouth/Throat: Oropharynx is clear and moist.    Cardiovascular: Normal rate and regular rhythm.   Pulmonary/Chest: Effort normal. He has no wheezes. He has no rales.  Neurological: He is alert and oriented to person, place, and time.  Skin: Skin is warm and dry. No rash noted.  Psychiatric: He has a normal mood and affect.  Vitals reviewed.  Lab Results  Component Value Date   WBC 5.9 02/08/2015   HGB 14.6 02/08/2015   HCT 43.7 02/08/2015   PLT 168.0 02/08/2015   GLUCOSE 82 06/11/2015   CHOL 174 02/08/2015   TRIG 114.0 02/08/2015   HDL 58.10 02/08/2015   LDLCALC 93 02/08/2015   ALT 25 02/08/2015   AST 28 02/08/2015   NA 138 06/11/2015   K 4.4 06/11/2015   CL 102 06/11/2015   CREATININE 0.99 06/11/2015   BUN 18 06/11/2015   CO2 29 06/11/2015   TSH 1.24 10/16/2014   PSA 0.90 02/08/2015   INR 2.1* 11/21/2015   HGBA1C 5.3 02/08/2015    Assessment & Plan:   Problem List Items Addressed This Visit    Tick bite - Primary    New problem. Patient with fever, worsening myalgias, headache, chills, decreased appetite following tick bite. Exam unrevealing.  Will treat empirically for tick borne illness with doxycycline.          Meds ordered this encounter  Medications  . doxycycline (VIBRA-TABS) 100 MG tablet  Sig: Take 1 tablet (100 mg total) by mouth 2 (two) times daily.    Dispense:  20 tablet    Refill:  0   Follow-up: PRN  [place]

## 2015-11-25 NOTE — Patient Instructions (Signed)
Take the doxy as prescribed.  Follow up in 1 week for INR.  Take care  Dr. Lacinda Axon

## 2015-11-26 DIAGNOSIS — W57XXXA Bitten or stung by nonvenomous insect and other nonvenomous arthropods, initial encounter: Secondary | ICD-10-CM | POA: Insufficient documentation

## 2015-11-26 NOTE — Assessment & Plan Note (Signed)
New problem. Patient with fever, worsening myalgias, headache, chills, decreased appetite following tick bite. Exam unrevealing.  Will treat empirically for tick borne illness with doxycycline.

## 2015-11-27 ENCOUNTER — Other Ambulatory Visit: Payer: PPO

## 2015-12-03 ENCOUNTER — Other Ambulatory Visit (INDEPENDENT_AMBULATORY_CARE_PROVIDER_SITE_OTHER): Payer: PPO

## 2015-12-03 DIAGNOSIS — I25759 Atherosclerosis of native coronary artery of transplanted heart with unspecified angina pectoris: Secondary | ICD-10-CM

## 2015-12-03 DIAGNOSIS — E785 Hyperlipidemia, unspecified: Secondary | ICD-10-CM

## 2015-12-03 DIAGNOSIS — Z7901 Long term (current) use of anticoagulants: Secondary | ICD-10-CM | POA: Diagnosis not present

## 2015-12-03 DIAGNOSIS — I10 Essential (primary) hypertension: Secondary | ICD-10-CM

## 2015-12-03 DIAGNOSIS — H5340 Unspecified visual field defects: Secondary | ICD-10-CM | POA: Diagnosis not present

## 2015-12-03 DIAGNOSIS — H35033 Hypertensive retinopathy, bilateral: Secondary | ICD-10-CM | POA: Diagnosis not present

## 2015-12-03 DIAGNOSIS — H43313 Vitreous membranes and strands, bilateral: Secondary | ICD-10-CM | POA: Diagnosis not present

## 2015-12-03 DIAGNOSIS — Z87898 Personal history of other specified conditions: Secondary | ICD-10-CM

## 2015-12-03 LAB — PROTIME-INR
INR: 2 ratio — AB (ref 0.8–1.0)
PROTHROMBIN TIME: 21.4 s — AB (ref 9.6–13.1)

## 2015-12-08 ENCOUNTER — Other Ambulatory Visit: Payer: Self-pay | Admitting: Family Medicine

## 2015-12-09 ENCOUNTER — Telehealth: Payer: Self-pay | Admitting: Family Medicine

## 2015-12-09 ENCOUNTER — Telehealth: Payer: Self-pay | Admitting: Cardiovascular Disease

## 2015-12-09 ENCOUNTER — Ambulatory Visit (INDEPENDENT_AMBULATORY_CARE_PROVIDER_SITE_OTHER): Payer: PPO | Admitting: Family Medicine

## 2015-12-09 VITALS — BP 98/62 | HR 68 | Temp 97.6°F | Wt 181.8 lb

## 2015-12-09 DIAGNOSIS — I959 Hypotension, unspecified: Secondary | ICD-10-CM | POA: Diagnosis not present

## 2015-12-09 DIAGNOSIS — R002 Palpitations: Secondary | ICD-10-CM | POA: Insufficient documentation

## 2015-12-09 NOTE — Progress Notes (Signed)
   Subjective:  Patient ID: [name], male    DOB: 1946-04-04  Age: 70 y.o. MRN: [id number]  CC: Dizziness, Palpitations  HPI:  70 year male with a complicated PMH including Afib, CAD, HTN, HLD, CVA presents with the above complaints.  Patient has been recently experiencing dizziness and palpitations. Started after recent tick bite. Has been worsening (particularly over this weekend). Reports low BP, palpitations and chest tightness. Reports "anxiety" and fatigue as well. No reported SOB. No recent fever, chills. Has completed course of Doxycycline. No known exacerbating or relieving factors. No other complaints today.  Social Hx   Social History   Social History  . Marital Status: Married    Spouse Name: N/A  . Number of Children: N/A  . Years of Education: N/A   Social History Main Topics  . Smoking status: Former Research scientist (life sciences)  . Smokeless tobacco: Not on file  . Alcohol Use: 1.2 - 1.8 oz/week    2-3 Glasses of wine per week     Comment: Wine daily with dinner  . Drug Use: No  . Sexual Activity: No   Other Topics Concern  . Not on file   Social History Narrative   Review of Systems  Constitutional: Positive for fatigue.  Respiratory: Positive for chest tightness.   Cardiovascular: Positive for palpitations.  Psychiatric/Behavioral: The patient is nervous/anxious.     Objective:  BP 98/62 mmHg  Pulse 68  Temp(Src) 97.6 F (36.4 C) (Oral)  Wt 181 lb 12.8 oz (82.464 kg)  SpO2 98%  BP/Weight 12/09/2015 11/25/2015 0000000  Systolic BP 98 123456 [id number]  Diastolic BP 62 84 82  Wt. (Lbs) 181.8 183.19 185.12  BMI 23.99 24.17 21.95   Physical Exam  Constitutional: He is oriented to person, place, and time. He appears well-developed. No distress.  Cardiovascular: Normal rate and regular rhythm.   Pulmonary/Chest: Effort normal. He has no wheezes. He has no rales.  Neurological: He is alert and oriented to person, place, and time.  Psychiatric: He has a normal mood and  affect.  Vitals reviewed.  Lab Results  Component Value Date   WBC 5.9 02/08/2015   HGB 14.6 02/08/2015   HCT 43.7 02/08/2015   PLT 168.0 02/08/2015   GLUCOSE 82 06/11/2015   CHOL 174 02/08/2015   TRIG 114.0 02/08/2015   HDL 58.10 02/08/2015   LDLCALC 93 02/08/2015   ALT 25 02/08/2015   AST 28 02/08/2015   NA 138 06/11/2015   K 4.4 06/11/2015   CL 102 06/11/2015   CREATININE 0.99 06/11/2015   BUN 18 06/11/2015   CO2 29 06/11/2015   TSH 1.24 10/16/2014   PSA 0.90 02/08/2015   INR 2.0* 12/03/2015   HGBA1C 5.3 02/08/2015    Assessment & Plan:   Problem List Items Addressed This Visit    Hypotension    New problem. Stopping HCTZ.      Palpitations - Primary    New problem. Suspect intermittent Afib. Setting up Holter as his cardiologist is not available this week or next.      Relevant Orders   Holter monitor - 72 hour   Holter monitor - 48 hour     Follow-up: Will touch base later this week.  [place]

## 2015-12-09 NOTE — Assessment & Plan Note (Signed)
New problem. Suspect intermittent Afib. Setting up Holter as his cardiologist is not available this week or next.

## 2015-12-09 NOTE — Assessment & Plan Note (Signed)
New problem. Stopping HCTZ.

## 2015-12-09 NOTE — Telephone Encounter (Signed)
S/w Dr. Thersa Salt who called requesting pt appt w/Dr. Rockey Situ. Pt in PCP office at this time c/o palpitations. Advised Dr. Lacinda Axon that Dr. Rockey Situ is out of the office next week. No appointments available this week. S/w Dr. Rockey Situ who is agreeable w/holter monitor. Order placed by Dr. Lacinda Axon. Forward to scheduling to call pt.

## 2015-12-09 NOTE — Patient Instructions (Signed)
Stop the HCTZ.  Keep an eye on your blood pressures.  I'll call and check in later this week.  Take care  Dr. Lacinda Axon

## 2015-12-09 NOTE — Progress Notes (Signed)
Pre visit review using our clinic review tool, if applicable. No additional management support is needed unless otherwise documented below in the visit note. 

## 2015-12-09 NOTE — Telephone Encounter (Signed)
This is your 115, thanks

## 2015-12-09 NOTE — Telephone Encounter (Signed)
[facility]  Patient Name: [name]  DOB: 07/25/1945    Initial Comment Caller states her husband has rapid heartbeat, 60-90, with fatigue, took doxycycline for tick bite recently   Nurse Assessment  Nurse: Wayne Sever, RN, Tillie Rung Date/Time (Eastern Time): 12/09/2015 10:00:58 AM  Confirm and document reason for call. If symptomatic, describe symptoms. You must click the next button to save text entered. ---Caller states he has had 2 strokes and heart attack. He saw Dr. Lacinda Axon for a tick bite, neck pain, body aches and exhaustion. He finished Doxcycline last Thursday. He is having episodes of heart racing and when he stands up he gets dizzy. Denies any fever at this time. The heart rate started 2 to 3 days ago. He got really dizzy when standing up.  Has the patient traveled out of the country within the last 30 days? ---Not Applicable  Does the patient have any new or worsening symptoms? ---Yes  Will a triage be completed? ---Yes  Related visit to physician within the last 2 weeks? ---No  Does the PT have any chronic conditions? (i.e. diabetes, asthma, etc.) ---Yes  List chronic conditions. ---Stroke x 2, Heart attack, Atrial Fibrillation, HTN  Is this a behavioral health or substance abuse call? ---No     Guidelines    Guideline Title Affirmed Question Affirmed Notes  Heart Rate and Heartbeat Questions [1] Skipped or extra beat(s) AND [2] increases with exercise or exertion    Final Disposition User   See Physician within 4 Hours (or PCP triage) Wayne Sever, RN, Tillie Rung    Comments  Scheduled with Dr Lacinda Axon at 115p   Referrals  REFERRED TO PCP OFFICE   Disagree/Comply: Comply

## 2015-12-13 ENCOUNTER — Ambulatory Visit (INDEPENDENT_AMBULATORY_CARE_PROVIDER_SITE_OTHER): Payer: PPO

## 2015-12-13 DIAGNOSIS — R002 Palpitations: Secondary | ICD-10-CM

## 2015-12-20 ENCOUNTER — Other Ambulatory Visit: Payer: Self-pay | Admitting: Family Medicine

## 2015-12-20 ENCOUNTER — Ambulatory Visit
Admission: RE | Admit: 2015-12-20 | Discharge: 2015-12-20 | Disposition: A | Discharge status: Home / Self Care | Payer: PPO | Source: Ambulatory Visit | Attending: Family Medicine | Admitting: Family Medicine

## 2015-12-20 DIAGNOSIS — R002 Palpitations: Secondary | ICD-10-CM | POA: Diagnosis not present

## 2015-12-20 NOTE — Telephone Encounter (Signed)
Refilled 11/22/15. Please advise?

## 2015-12-27 ENCOUNTER — Other Ambulatory Visit: Payer: Self-pay | Admitting: Family Medicine

## 2015-12-30 ENCOUNTER — Telehealth: Payer: Self-pay | Admitting: Family Medicine

## 2015-12-30 NOTE — Telephone Encounter (Signed)
He can resume HCTZ/

## 2015-12-30 NOTE — Telephone Encounter (Signed)
HCTZ was held due to hypotension, please advise. thanks

## 2015-12-30 NOTE — Telephone Encounter (Signed)
Spoke with the patient, he will resume the HCTZ and let us know how his BP does in the next few days, thanks

## 2015-12-30 NOTE — Telephone Encounter (Signed)
Pt called about this weekend his BP has gone up to 166/92 and 169/73 it's been running. Pt wanted to know should he be going back to the dieretic? Please advise?  Call pt @ [phone]. Thank you!

## 2015-12-31 ENCOUNTER — Other Ambulatory Visit (INDEPENDENT_AMBULATORY_CARE_PROVIDER_SITE_OTHER): Payer: PPO

## 2015-12-31 DIAGNOSIS — Z7901 Long term (current) use of anticoagulants: Secondary | ICD-10-CM | POA: Diagnosis not present

## 2015-12-31 LAB — PROTIME-INR
INR: 2.1 ratio — ABNORMAL HIGH (ref 0.8–1.0)
PROTHROMBIN TIME: 22.8 s — AB (ref 9.6–13.1)

## 2016-01-02 ENCOUNTER — Other Ambulatory Visit: Payer: PPO

## 2016-01-21 ENCOUNTER — Telehealth: Payer: Self-pay | Admitting: Family Medicine

## 2016-01-21 NOTE — Telephone Encounter (Signed)
Patient scheduled for 02/04/16 with fasting labs.

## 2016-01-21 NOTE — Telephone Encounter (Signed)
Repeat later this month.

## 2016-01-21 NOTE — Telephone Encounter (Signed)
PCP asked if patient would like to switch to eliquis. Patient was called and stated he wanted to check price of eliquis before switching. He wanted to know when he could have a repeat INR?

## 2016-01-22 ENCOUNTER — Other Ambulatory Visit: Payer: Self-pay | Admitting: Family Medicine

## 2016-01-22 NOTE — Telephone Encounter (Signed)
Called to confirm if patient was taking HCTZ. Patient says he has been taking it. Patient said his BP was slightly elevated for a couple weeks, but has been better recently. BP today was 120/63. Other readings from past week have been 174/83, 126/88, and 141/92. I will refill HCTZ.   Pt also had a question about his Lexapro. Pt says his anxiety levels have increased over past couple weeks. I noticed on last refill sent in it says for patient to take 1/2 tablet by mouth daily. Patient says he has been taking a full tablet daily. Pt wants to know if he can increase to two tablet daily of his Lexapro.

## 2016-01-22 NOTE — Telephone Encounter (Signed)
Okay with refill on HCTZ. Can increase lexapro to 2 tablets (20 mg daily). Send refill please.

## 2016-01-23 ENCOUNTER — Other Ambulatory Visit: Payer: Self-pay

## 2016-01-23 DIAGNOSIS — F419 Anxiety disorder, unspecified: Secondary | ICD-10-CM

## 2016-01-23 MED ORDER — ESCITALOPRAM OXALATE 20 MG PO TABS: 20.0000 mg | ORAL_TABLET | Freq: Every day | ORAL | 2 refills | Status: DC

## 2016-01-23 NOTE — Telephone Encounter (Signed)
Patient was given instruction that he could increase Lexapro. Patient gave verbal understanding.

## 2016-02-04 ENCOUNTER — Other Ambulatory Visit (INDEPENDENT_AMBULATORY_CARE_PROVIDER_SITE_OTHER): Payer: PPO

## 2016-02-04 DIAGNOSIS — Z7901 Long term (current) use of anticoagulants: Secondary | ICD-10-CM

## 2016-02-04 DIAGNOSIS — I10 Essential (primary) hypertension: Secondary | ICD-10-CM | POA: Diagnosis not present

## 2016-02-04 DIAGNOSIS — I25759 Atherosclerosis of native coronary artery of transplanted heart with unspecified angina pectoris: Secondary | ICD-10-CM

## 2016-02-04 DIAGNOSIS — E785 Hyperlipidemia, unspecified: Secondary | ICD-10-CM | POA: Diagnosis not present

## 2016-02-04 DIAGNOSIS — Z87898 Personal history of other specified conditions: Secondary | ICD-10-CM | POA: Diagnosis not present

## 2016-02-04 LAB — LIPID PANEL
CHOL/HDL RATIO: 3
CHOLESTEROL: 177 mg/dL (ref 0–200)
HDL: 56.1 mg/dL (ref >39.00)
LDL Cholesterol: 100 mg/dL — ABNORMAL HIGH (ref 0–99)
NonHDL: 120.48
TRIGLYCERIDES: 103 mg/dL (ref 0.0–149.0)
VLDL: 20.6 mg/dL (ref 0.0–40.0)

## 2016-02-04 LAB — CBC WITH DIFFERENTIAL/PLATELET
BASOS ABS: 0 10*3/uL (ref 0.0–0.1)
BASOS PCT: 0.6 % (ref 0.0–3.0)
Eosinophils Absolute: 0.1 10*3/uL (ref 0.0–0.7)
Eosinophils Relative: 2.7 % (ref 0.0–5.0)
HEMATOCRIT: 41.7 % (ref 39.0–52.0)
HEMOGLOBIN: 14.3 g/dL (ref 13.0–17.0)
LYMPHS PCT: 35.6 % (ref 12.0–46.0)
Lymphs Abs: 1.8 10*3/uL (ref 0.7–4.0)
MCHC: 34.2 g/dL (ref 30.0–36.0)
MCV: 92.7 fl (ref 78.0–100.0)
MONOS PCT: 12 % (ref 3.0–12.0)
Monocytes Absolute: 0.6 10*3/uL (ref 0.1–1.0)
NEUTROS ABS: 2.5 10*3/uL (ref 1.4–7.7)
Neutrophils Relative %: 49.1 % (ref 43.0–77.0)
PLATELETS: 171 10*3/uL (ref 150.0–400.0)
RBC: 4.5 Mil/uL (ref 4.22–5.81)
RDW: 14.2 % (ref 11.5–15.5)
WBC: 5.1 10*3/uL (ref 4.0–10.5)

## 2016-02-04 LAB — COMPREHENSIVE METABOLIC PANEL
ALBUMIN: 3.9 g/dL (ref 3.5–5.2)
ALT: 30 U/L (ref 0–53)
AST: 31 U/L (ref 0–37)
Alkaline Phosphatase: 73 U/L (ref 39–117)
BILIRUBIN TOTAL: 0.6 mg/dL (ref 0.2–1.2)
BUN: 14 mg/dL (ref 6–23)
CALCIUM: 8.9 mg/dL (ref 8.4–10.5)
CO2: 31 meq/L (ref 19–32)
CREATININE: 0.89 mg/dL (ref 0.40–1.50)
Chloride: 105 mEq/L (ref 96–112)
GFR: 89.83 mL/min (ref >60.00)
Glucose, Bld: 99 mg/dL (ref 70–99)
Potassium: 4.2 mEq/L (ref 3.5–5.1)
Sodium: 140 mEq/L (ref 135–145)
Total Protein: 6.6 g/dL (ref 6.0–8.3)

## 2016-02-04 LAB — PROTIME-INR
INR: 3 ratio — ABNORMAL HIGH (ref 0.8–1.0)
Prothrombin Time: 32.5 s — ABNORMAL HIGH (ref 9.6–13.1)

## 2016-02-04 LAB — HEMOGLOBIN A1C: HEMOGLOBIN A1C: 5.3 % (ref 4.6–6.5)

## 2016-02-04 LAB — PSA: PSA: 0.98 ng/mL (ref 0.10–4.00)

## 2016-02-05 ENCOUNTER — Ambulatory Visit: Payer: PPO | Admitting: Cardiovascular Disease

## 2016-02-11 ENCOUNTER — Ambulatory Visit (INDEPENDENT_AMBULATORY_CARE_PROVIDER_SITE_OTHER): Payer: PPO | Admitting: Family Medicine

## 2016-02-11 ENCOUNTER — Encounter: Payer: Self-pay | Admitting: Family Medicine

## 2016-02-11 VITALS — BP 162/78 | HR 62 | Temp 97.9°F | Wt 184.2 lb

## 2016-02-11 DIAGNOSIS — E785 Hyperlipidemia, unspecified: Secondary | ICD-10-CM

## 2016-02-11 DIAGNOSIS — I2510 Atherosclerotic heart disease of native coronary artery without angina pectoris: Secondary | ICD-10-CM | POA: Diagnosis not present

## 2016-02-11 DIAGNOSIS — F329 Major depressive disorder, single episode, unspecified: Secondary | ICD-10-CM

## 2016-02-11 DIAGNOSIS — F418 Other specified anxiety disorders: Secondary | ICD-10-CM

## 2016-02-11 DIAGNOSIS — Z23 Encounter for immunization: Secondary | ICD-10-CM | POA: Diagnosis not present

## 2016-02-11 DIAGNOSIS — I10 Essential (primary) hypertension: Secondary | ICD-10-CM

## 2016-02-11 DIAGNOSIS — F419 Anxiety disorder, unspecified: Secondary | ICD-10-CM

## 2016-02-11 DIAGNOSIS — I480 Paroxysmal atrial fibrillation: Secondary | ICD-10-CM

## 2016-02-11 DIAGNOSIS — Z0000 Encounter for general adult medical examination without abnormal findings: Secondary | ICD-10-CM

## 2016-02-11 NOTE — Assessment & Plan Note (Signed)
Stable. Continue current meds.   

## 2016-02-11 NOTE — Assessment & Plan Note (Signed)
Stable home readings. Continue Bisprolol, Lisinopril, HCTZ.

## 2016-02-11 NOTE — Assessment & Plan Note (Signed)
Stable. Continue Warfarin.

## 2016-02-11 NOTE — Assessment & Plan Note (Signed)
Stable on Lexapro

## 2016-02-11 NOTE — Assessment & Plan Note (Signed)
Not at goal. Continue Lipitor. Will see if we can get patient on PCSK9.

## 2016-02-11 NOTE — Progress Notes (Signed)
Subjective:  Patient ID: [name], male    DOB: [birth year]  Age: 70 y.o. MRN: [id number]  CC: Follow up  HPI:  70 year old male with CAD, A. fib, hyperlipidemia, anxiety/depression, hypertension presents for routine follow-up.  CAD  Stable. No recent chest pain or shortness of breath.  Compliant with aspirin, statin, bisoprolol, lisinopril.  HTN  Well controlled per home readings (at goal).  Compliant with Lisinopril, Bisoprolol, HCTZ.  HLD  Stable.  Not at goal.  Currently on Lipitor 20.  Has not tolerated other statins or increase in dose.  Could not afford Zetia.  Afib/hx of CVA  Stable on Bisoprolol and Warfarin (higher goal INR per cards, 2.5-3.5).   Anxiety/depression  Stable on Lexapro. Doing well.  Social Hx   Social History   Social History  . Marital status: Married    Spouse name: N/A  . Number of children: N/A  . Years of education: N/A   Social History Main Topics  . Smoking status: Former Research scientist (life sciences)  . Smokeless tobacco: None  . Alcohol use 1.2 - 1.8 oz/week    2 - 3 Glasses of wine per week     Comment: Wine daily with dinner  . Drug use: No  . Sexual activity: No   Other Topics Concern  . None   Social History Narrative  . None   Review of Systems  Constitutional: Positive for fever.  Respiratory: Negative.   Cardiovascular: Negative.    Objective:  BP (!) 162/78 (BP Location: Left Arm, Patient Position: Sitting, Cuff Size: Normal)   Pulse 62   Temp 97.9 F (36.6 C) (Oral)   Wt 184 lb 4 oz (83.6 kg)   SpO2 96%   BMI 24.31 kg/m   BP/Weight 02/11/2016 12/09/2015 [id number]  Systolic BP 0000000 98 123456  Diastolic BP 78 62 84  Wt. (Lbs) 184.25 181.8 183.19  BMI 24.31 23.99 24.17   Physical Exam  Constitutional: He is oriented to person, place, and time. He appears well-developed. No distress.  Cardiovascular: Normal rate and regular rhythm.   Pulmonary/Chest: Effort normal. He has no wheezes. He has no rales.  Abdominal:  Soft. He exhibits no distension. There is no tenderness. There is no rebound and no guarding.  Neurological: He is alert and oriented to person, place, and time.  Psychiatric: He has a normal mood and affect.  Vitals reviewed.  Lab Results  Component Value Date   WBC 5.1 02/04/2016   HGB 14.3 02/04/2016   HCT 41.7 02/04/2016   PLT 171.0 02/04/2016   GLUCOSE 99 02/04/2016   CHOL 177 02/04/2016   TRIG 103.0 02/04/2016   HDL 56.10 02/04/2016   LDLCALC 100 (H) 02/04/2016   ALT 30 02/04/2016   AST 31 02/04/2016   NA 140 02/04/2016   K 4.2 02/04/2016   CL 105 02/04/2016   CREATININE 0.89 02/04/2016   BUN 14 02/04/2016   CO2 31 02/04/2016   TSH 1.24 10/16/2014   PSA 0.98 02/04/2016   INR 3.0 (H) 02/04/2016   HGBA1C 5.3 02/04/2016   Assessment & Plan:   Problem List Items Addressed This Visit    A-fib ([place])    Stable. Continue Warfarin.      CAD (coronary artery disease) - Primary    Stable. Continue current meds.      Hyperlipidemia    Not at goal. Continue Lipitor. Will see if we can get patient on PCSK9.      Anxiety and depression    Stable on  Lexapro.      Essential hypertension    Stable home readings. Continue Bisprolol, Lisinopril, HCTZ.      Preventative health care    Discuss PSA reading and prostate cancer screening. After discussion, patient elected against rectal exam.       Other Visit Diagnoses    Encounter for immunization       Relevant Orders   Flu Vaccine QUAD 36+ mos IM (Completed)     Follow-up: 3-6 months.  [place]

## 2016-02-11 NOTE — Patient Instructions (Signed)
Continue your current meds.  We will call with your referral to Dermatology.  Follow up in 3-6 months.  Take care  Dr. Lacinda Axon

## 2016-02-11 NOTE — Assessment & Plan Note (Signed)
Discuss PSA reading and prostate cancer screening. After discussion, patient elected against rectal exam.

## 2016-02-25 DIAGNOSIS — H4311 Vitreous hemorrhage, right eye: Secondary | ICD-10-CM | POA: Diagnosis not present

## 2016-02-25 DIAGNOSIS — H33311 Horseshoe tear of retina without detachment, right eye: Secondary | ICD-10-CM | POA: Diagnosis not present

## 2016-02-25 DIAGNOSIS — H3561 Retinal hemorrhage, right eye: Secondary | ICD-10-CM | POA: Diagnosis not present

## 2016-02-25 DIAGNOSIS — H43813 Vitreous degeneration, bilateral: Secondary | ICD-10-CM | POA: Diagnosis not present

## 2016-02-28 ENCOUNTER — Other Ambulatory Visit: Payer: Self-pay | Admitting: Family Medicine

## 2016-02-28 ENCOUNTER — Telehealth: Payer: Self-pay | Admitting: *Deleted

## 2016-02-28 DIAGNOSIS — Z7901 Long term (current) use of anticoagulants: Secondary | ICD-10-CM

## 2016-02-28 NOTE — Telephone Encounter (Signed)
Pt coming in for labs on Friday. Need lab orders placed.

## 2016-02-28 NOTE — Telephone Encounter (Signed)
INR only

## 2016-02-29 ENCOUNTER — Other Ambulatory Visit: Payer: Self-pay | Admitting: Family Medicine

## 2016-02-29 DIAGNOSIS — Z7901 Long term (current) use of anticoagulants: Secondary | ICD-10-CM

## 2016-03-02 ENCOUNTER — Telehealth: Payer: Self-pay | Admitting: Family Medicine

## 2016-03-02 NOTE — Telephone Encounter (Signed)
This is a historical medication but pt has been taking this with his 1 mg tablets. Patient PT/INR at 3.0. Please advise?

## 2016-03-02 NOTE — Telephone Encounter (Signed)
Pt taking 1.5 tablet. Pt stated that he had a tear in his iris and had to have laser surgery to fix. The eye doctor stated it may have been coming from warfarin.

## 2016-03-02 NOTE — Telephone Encounter (Signed)
Covering for Dr. Lacinda Axon. Refills given. CMA confirmed dosing with patient.

## 2016-03-02 NOTE — Telephone Encounter (Signed)
Order placed

## 2016-03-02 NOTE — Telephone Encounter (Signed)
Patient can consider switch to Eliquis as we have discussed previously.

## 2016-03-03 ENCOUNTER — Encounter: Payer: Self-pay | Admitting: Cardiovascular Disease

## 2016-03-03 ENCOUNTER — Ambulatory Visit (INDEPENDENT_AMBULATORY_CARE_PROVIDER_SITE_OTHER): Payer: PPO | Admitting: Cardiovascular Disease

## 2016-03-03 VITALS — BP 140/80 | HR 57 | Ht 73.0 in | Wt 183.5 lb

## 2016-03-03 DIAGNOSIS — I639 Cerebral infarction, unspecified: Secondary | ICD-10-CM

## 2016-03-03 DIAGNOSIS — R5382 Chronic fatigue, unspecified: Secondary | ICD-10-CM

## 2016-03-03 DIAGNOSIS — I480 Paroxysmal atrial fibrillation: Secondary | ICD-10-CM | POA: Diagnosis not present

## 2016-03-03 DIAGNOSIS — I2510 Atherosclerotic heart disease of native coronary artery without angina pectoris: Secondary | ICD-10-CM | POA: Diagnosis not present

## 2016-03-03 DIAGNOSIS — E785 Hyperlipidemia, unspecified: Secondary | ICD-10-CM

## 2016-03-03 DIAGNOSIS — I10 Essential (primary) hypertension: Secondary | ICD-10-CM | POA: Diagnosis not present

## 2016-03-03 DIAGNOSIS — F419 Anxiety disorder, unspecified: Secondary | ICD-10-CM

## 2016-03-03 DIAGNOSIS — Z7189 Other specified counseling: Secondary | ICD-10-CM

## 2016-03-03 DIAGNOSIS — F329 Major depressive disorder, single episode, unspecified: Secondary | ICD-10-CM

## 2016-03-03 DIAGNOSIS — F418 Other specified anxiety disorders: Secondary | ICD-10-CM

## 2016-03-03 MED ORDER — EZETIMIBE 10 MG PO TABS: 10.0000 mg | ORAL_TABLET | Freq: Every day | ORAL | 11 refills | Status: DC

## 2016-03-03 NOTE — Patient Instructions (Signed)

## 2016-03-03 NOTE — Telephone Encounter (Signed)
Pt stated do to cost he will continue with his warfarin.

## 2016-03-03 NOTE — Telephone Encounter (Signed)
Pt stated he wanted to stick with warfarin due to Eliquis being costly.

## 2016-03-03 NOTE — Progress Notes (Signed)
Cardiology Office Note  Date:  03/03/2016   ID:  [name], DOB 04/24/1946, MRN [id number]  PCP:  Coral Spikes, DO   Chief Complaint  Patient presents with  . other    6 month follow up. Meds reviewed by the pt. verbally. Pt. c/o sleeping all the time and just fatigue.     HPI:  70 y/o male with a history of  PAF, prior stroke treated with TPA with mild residual left sided weakness, Depression with chronic fatigue, chronic anticoagulation therapy with Coumadin and h/o CAD s/p PCI to the LCx in 2007. He reports mini stroke in 2015 seen on PET scan, did not have symptoms and was on Coumadin at the time.  In follow-up today he presents with his wife They're concerned about chronic fatigue Wife reports that he can sleep 12 hours and still be tired with no energy This is been a long-standing issue dating back many years Etiology unclear, no regular exercise program. Lab work reviewed with him. TSH normal last year Wife also reports that he is weak, now she has to open jars and cans in the kitchen  Other new issues include a Retinal tear, needed laser treatment Concerned it was brought on by the Coumadin, ophthalmology did not think so per the patient  Previous stress test again reviewed with him showing no ischemia Holter monitor reviewed with him showing APCs, PVCs, very short runs of atrial tachycardia up to 6 beats  He reports that he does appreciate these palpitations at times, sometimes certain days worse than others  Wife reports that he drinks wine every night at least 2 glasses  EKG on today's visit shows normal sinus rhythm with rate 57 bpm, no significant ST or T-wave changes  Other past medical history He does report having myalgias on Crestor and higher dose Lipitor Previous total cholesterol 174, LDL 93   PMH:   has a past medical history of A-fib ([place]); BPH (benign prostatic hyperplasia); Coronary artery disease; Depression; GERD (gastroesophageal reflux  disease); Hyperlipidemia; Hypertension; IBS (irritable bowel syndrome); IBS (irritable bowel syndrome); PNA (pneumonia); Skin cancer; and Stroke ([place]).  PSH:    Past Surgical History:  Procedure Laterality Date  . ADENOIDECTOMY  1951  . CARDIAC CATHETERIZATION  2006   X1 STENT  . THYROIDECTOMY, PARTIAL      Current Outpatient Prescriptions  Medication Sig Dispense Refill  . aspirin 81 MG tablet Take 81 mg by mouth daily.    Marland Kitchen atorvastatin (LIPITOR) 20 MG tablet TAKE 1 TABLET (20 MG TOTAL) BY MOUTH DAILY. 90 tablet 1  . bisoprolol (ZEBETA) 5 MG tablet Take 1 tablet (5 mg total) by mouth daily. 90 tablet 4  . Cholecalciferol (VITAMIN D) 2000 UNITS CAPS Take 2,000 Units by mouth 2 (two) times daily.     Marland Kitchen dicyclomine (BENTYL) 10 MG capsule TAKE 1 CAPSULE (10 MG TOTAL) BY MOUTH 2 (TWO) TIMES DAILY. 90 capsule 3  . escitalopram (LEXAPRO) 20 MG tablet Take 1 tablet (20 mg total) by mouth daily. 180 tablet 2  . hydrochlorothiazide (MICROZIDE) 12.5 MG capsule Take 12.5 mg by mouth daily.     Marland Kitchen lisinopril (PRINIVIL,ZESTRIL) 20 MG tablet TAKE 1 TABLET BY MOUTH TWICE A DAY 180 tablet 3  . nitroGLYCERIN (NITROSTAT) 0.4 MG SL tablet Place 1 tablet (0.4 mg total) under the tongue every 5 (five) minutes as needed for chest pain. 25 tablet 3  . ranitidine (ZANTAC) 150 MG tablet Take 1 tablet (150 mg total) by mouth  2 (two) times daily. 180 tablet 11  . vitamin B-12 (CYANOCOBALAMIN) 1000 MCG tablet Take 1,000 mcg by mouth daily.    Marland Kitchen warfarin (COUMADIN) 5 MG tablet Take 1.5 tablets (7.5 mg total) by mouth daily. 90 tablet 1  . ezetimibe (ZETIA) 10 MG tablet Take 1 tablet (10 mg total) by mouth daily. 30 tablet 11   No current facility-administered medications for this visit.      Allergies:   Bactrim [sulfamethoxazole-trimethoprim] and Crestor [rosuvastatin calcium]   Social History:  The patient  reports that he has quit smoking. He has never used smokeless tobacco. He reports that he drinks about  1.2 - 1.8 oz of alcohol per week . He reports that he does not use drugs.   Family History:   family history includes Arthritis in his mother; Cancer in his maternal aunt and mother; Heart Problems in his father; Heart disease in his father, paternal grandfather, and paternal grandmother; Hypertension in his father; Mental illness in his mother; Stroke in his maternal grandmother and paternal uncle; Sudden death in his maternal grandfather.    Review of Systems: Review of Systems  Constitutional: Positive for malaise/fatigue.  Respiratory: Negative.   Cardiovascular: Negative.   Gastrointestinal: Negative.   Musculoskeletal: Negative.   Neurological: Positive for weakness.  Psychiatric/Behavioral: Positive for depression.  All other systems reviewed and are negative.    PHYSICAL EXAM: VS:  BP 140/80 (BP Location: Left Arm, Patient Position: Sitting, Cuff Size: Normal)   Pulse (!) 57   Ht 6\' 1"  (1.854 m)   Wt 183 lb 8 oz (83.2 kg)   BMI 24.21 kg/m  , BMI Body mass index is 24.21 kg/m. GEN: Well nourished, well developed, in no acute distress  HEENT: normal  Neck: no JVD, carotid bruits, or masses Cardiac: RRR; no murmurs, rubs, or gallops,no edema  Respiratory:  clear to auscultation bilaterally, normal work of breathing GI: soft, nontender, nondistended, + BS MS: no deformity or atrophy  Skin: warm and dry, no rash Neuro:  Strength and sensation are intact Psych: euthymic mood, full affect    Recent Labs: 02/04/2016: ALT 30; BUN 14; Creatinine, Ser 0.89; Hemoglobin 14.3; Platelets 171.0; Potassium 4.2; Sodium 140    Lipid Panel Lab Results  Component Value Date   CHOL 177 02/04/2016   HDL 56.10 02/04/2016   LDLCALC 100 (H) 02/04/2016   TRIG 103.0 02/04/2016      Wt Readings from Last 3 Encounters:  03/03/16 183 lb 8 oz (83.2 kg)  02/11/16 184 lb 4 oz (83.6 kg)  12/09/15 181 lb 12.8 oz (82.5 kg)       ASSESSMENT AND PLAN:  Paroxysmal atrial fibrillation  ([place]) - Plan: EKG 12-Lead Recent Holter monitor showing ectopy, no atrial fibrillation If symptoms get worse, 30 day monitor could be ordered For now we'll continue low-dose beta blocker, anticoagulation  Coronary artery disease involving native coronary artery of native heart without angina pectoris - Plan: EKG 12-Lead Recent negative stress test in early 2017, Currently with no symptoms of angina. No further workup at this time. Continue current medication regimen.  Essential hypertension - Plan: EKG 12-Lead Blood pressure is well controlled on today's visit. No changes made to the medications. Went through each one of his medications, detailed what they are for  Hyperlipidemia Cholesterol is above goal, recommended he start zetia. We went on the online website to confirm prices affordable New prescription provided  Cerebral infarction due to unspecified mechanism Discussed symptoms following his previous stroke,  depression seem to start after his stroke  Anxiety and depression Currently taking Lexapro. Uncertain if this is helping, has chronic fatigue  Chronic fatigue Long discussion concerning his symptoms, went through cardiac issues, medications, lab work. Did not see free testosterone level. Suggested they discuss this with Dr. Lacinda Axon, perhaps could be done on routine screening   Encounter for anticoagulation discussion and counseling Tolerating warfarin, denies any significant bleeding   Total encounter time more than 25 minutes  Greater than 50% was spent in counseling and coordination of care with the patient   Disposition:   F/U  12 months   Orders Placed This Encounter  Procedures  . EKG 12-Lead     Signed, Esmond Plants, M.D., Ph.D. 03/03/2016  [place], [place]

## 2016-03-06 ENCOUNTER — Other Ambulatory Visit (INDEPENDENT_AMBULATORY_CARE_PROVIDER_SITE_OTHER): Payer: PPO

## 2016-03-06 DIAGNOSIS — Z7901 Long term (current) use of anticoagulants: Secondary | ICD-10-CM | POA: Diagnosis not present

## 2016-03-06 LAB — PROTIME-INR
INR: 2.2 ratio — AB (ref 0.8–1.0)
PROTHROMBIN TIME: 23.5 s — AB (ref 9.6–13.1)

## 2016-03-10 ENCOUNTER — Telehealth: Payer: Self-pay | Admitting: Family Medicine

## 2016-03-10 NOTE — Telephone Encounter (Signed)
Pt called regarding his pt/inr results being low. He was asking if he needs to change his dosage for warfarin (COUMADIN) 5 MG tablet. Please advise, thank you!  Call pt @ [phone]

## 2016-03-10 NOTE — Telephone Encounter (Signed)
Pt given results and given a lab appt on 03/16/16. Please place orders.

## 2016-03-11 NOTE — Telephone Encounter (Signed)
This is too soon. Make it later next week.

## 2016-03-11 NOTE — Telephone Encounter (Signed)
Pt was called and rescheduled to 03/26/16. Pt asked about a referral to dermatology. He said that you all discussed at last office visit.

## 2016-03-13 ENCOUNTER — Telehealth: Payer: Self-pay | Admitting: Family Medicine

## 2016-03-13 NOTE — Telephone Encounter (Signed)
Pt was called to ask if he would be okay with trying Repatha for his cholesterol due to it not coming down.

## 2016-03-16 ENCOUNTER — Other Ambulatory Visit: Payer: PPO

## 2016-03-16 NOTE — Telephone Encounter (Signed)
I will follow up with Claiborne Billings the repatha rep. To see what information I can get . thanks

## 2016-03-16 NOTE — Telephone Encounter (Signed)
Pt was called and stated that he would try repatha but wanted to know the cost first before getting it. [name] stated that for medication to get approved the pts chart will need to reflect the right diagnosis code, medication intolerances, and medications he has tried.

## 2016-03-16 NOTE — Telephone Encounter (Signed)
Pt expecting a call from you after that meeting.

## 2016-03-16 NOTE — Telephone Encounter (Signed)
Pt called back and stated he was told by Dr Rockey Situ last week to take Zetia. Is that ok please let pt know. Thank you!

## 2016-03-17 ENCOUNTER — Other Ambulatory Visit: Payer: Self-pay | Admitting: Family Medicine

## 2016-03-17 DIAGNOSIS — H33311 Horseshoe tear of retina without detachment, right eye: Secondary | ICD-10-CM | POA: Diagnosis not present

## 2016-03-17 DIAGNOSIS — F419 Anxiety disorder, unspecified: Secondary | ICD-10-CM

## 2016-03-18 ENCOUNTER — Other Ambulatory Visit: Payer: Self-pay | Admitting: Family Medicine

## 2016-03-18 MED ORDER — EVOLOCUMAB 140 MG/ML ~~LOC~~ SOSY: 140.0000 mg | PREFILLED_SYRINGE | SUBCUTANEOUS | 3 refills | Status: DC

## 2016-03-18 NOTE — Telephone Encounter (Signed)
Met with Claiborne Billings, Patient has health team advantage/ silver scripts so we can't get a amount unless we submit it to the insurance first.

## 2016-03-18 NOTE — Telephone Encounter (Signed)
Rx prescribed.

## 2016-03-25 ENCOUNTER — Other Ambulatory Visit: Payer: Self-pay

## 2016-03-25 DIAGNOSIS — Z7901 Long term (current) use of anticoagulants: Principal | ICD-10-CM

## 2016-03-25 DIAGNOSIS — Z5181 Encounter for therapeutic drug level monitoring: Secondary | ICD-10-CM

## 2016-03-26 ENCOUNTER — Other Ambulatory Visit (INDEPENDENT_AMBULATORY_CARE_PROVIDER_SITE_OTHER): Payer: PPO

## 2016-03-26 DIAGNOSIS — Z7901 Long term (current) use of anticoagulants: Secondary | ICD-10-CM | POA: Diagnosis not present

## 2016-03-26 DIAGNOSIS — Z5181 Encounter for therapeutic drug level monitoring: Secondary | ICD-10-CM

## 2016-03-26 LAB — PROTIME-INR
INR: 2.2 ratio — AB (ref 0.8–1.0)
Prothrombin Time: 23.4 s — ABNORMAL HIGH (ref 9.6–13.1)

## 2016-03-27 ENCOUNTER — Telehealth: Payer: Self-pay | Admitting: *Deleted

## 2016-03-27 ENCOUNTER — Other Ambulatory Visit: Payer: Self-pay

## 2016-03-27 NOTE — Telephone Encounter (Signed)
Pt has requested INR results, he also has a medication question  Pt contact [phone]

## 2016-03-27 NOTE — Telephone Encounter (Signed)
PCP told pt to take 7.5mg  +1 mg twice a week and the remainder of the week take 7.5 mg. Pt is taking a multivitamin with 60 mg vit K. He has been eating out more recently due to wife not be able to cook.

## 2016-03-30 NOTE — Telephone Encounter (Signed)
PA form completed and submitted. thanks

## 2016-03-31 ENCOUNTER — Telehealth: Payer: Self-pay | Admitting: *Deleted

## 2016-03-31 NOTE — Telephone Encounter (Signed)
[facility] requested a update on the PA for repatha form that was faxed to the office. Contact [phone]

## 2016-03-31 NOTE — Telephone Encounter (Signed)
Please advise pt of PA being sent.

## 2016-03-31 NOTE — Telephone Encounter (Signed)
I faxed one back yesterday, but they sent a new one today, so I have to fill it out.

## 2016-04-02 ENCOUNTER — Telehealth: Payer: Self-pay | Admitting: *Deleted

## 2016-04-02 NOTE — Telephone Encounter (Signed)
Pt has requested to have a order for his INR and also he would like to know when he will need this.  Pt contact [phone]

## 2016-04-02 NOTE — Telephone Encounter (Signed)
Place orders. Would you like for him to have labs in the next week or so?

## 2016-04-03 NOTE — Telephone Encounter (Signed)
Next week is fine

## 2016-04-03 NOTE — Telephone Encounter (Signed)
Pt scheduled  

## 2016-04-03 NOTE — Telephone Encounter (Signed)
Please advise pt to schedule labs for nect week.

## 2016-04-08 ENCOUNTER — Other Ambulatory Visit: Payer: Self-pay

## 2016-04-08 DIAGNOSIS — Z5181 Encounter for therapeutic drug level monitoring: Secondary | ICD-10-CM

## 2016-04-08 DIAGNOSIS — Z7901 Long term (current) use of anticoagulants: Principal | ICD-10-CM

## 2016-04-09 ENCOUNTER — Other Ambulatory Visit (INDEPENDENT_AMBULATORY_CARE_PROVIDER_SITE_OTHER): Payer: PPO

## 2016-04-09 DIAGNOSIS — Z7901 Long term (current) use of anticoagulants: Secondary | ICD-10-CM | POA: Diagnosis not present

## 2016-04-09 DIAGNOSIS — Z5181 Encounter for therapeutic drug level monitoring: Secondary | ICD-10-CM | POA: Diagnosis not present

## 2016-04-09 LAB — PROTIME-INR
INR: 2.1 ratio — AB (ref 0.8–1.0)
PROTHROMBIN TIME: 22.6 s — AB (ref 9.6–13.1)

## 2016-04-16 ENCOUNTER — Other Ambulatory Visit: Payer: Self-pay

## 2016-04-16 DIAGNOSIS — Z5181 Encounter for therapeutic drug level monitoring: Secondary | ICD-10-CM

## 2016-04-16 DIAGNOSIS — Z7901 Long term (current) use of anticoagulants: Principal | ICD-10-CM

## 2016-04-17 ENCOUNTER — Other Ambulatory Visit (INDEPENDENT_AMBULATORY_CARE_PROVIDER_SITE_OTHER): Payer: PPO

## 2016-04-17 DIAGNOSIS — Z7901 Long term (current) use of anticoagulants: Secondary | ICD-10-CM

## 2016-04-17 DIAGNOSIS — Z5181 Encounter for therapeutic drug level monitoring: Secondary | ICD-10-CM

## 2016-04-17 LAB — PROTIME-INR
INR: 2.1 ratio — AB (ref 0.8–1.0)
PROTHROMBIN TIME: 23 s — AB (ref 9.6–13.1)

## 2016-04-21 ENCOUNTER — Other Ambulatory Visit: Payer: Self-pay | Admitting: Family Medicine

## 2016-04-21 MED ORDER — WARFARIN SODIUM 7.5 MG PO TABS: ORAL_TABLET | ORAL | 3 refills | Status: DC

## 2016-05-07 ENCOUNTER — Other Ambulatory Visit: Payer: Self-pay | Admitting: Family Medicine

## 2016-05-13 ENCOUNTER — Other Ambulatory Visit: Payer: Self-pay

## 2016-05-13 DIAGNOSIS — Z5181 Encounter for therapeutic drug level monitoring: Secondary | ICD-10-CM

## 2016-05-13 DIAGNOSIS — Z7901 Long term (current) use of anticoagulants: Principal | ICD-10-CM

## 2016-05-14 ENCOUNTER — Other Ambulatory Visit (INDEPENDENT_AMBULATORY_CARE_PROVIDER_SITE_OTHER): Payer: PPO

## 2016-05-14 ENCOUNTER — Other Ambulatory Visit: Payer: Self-pay | Admitting: Family Medicine

## 2016-05-14 DIAGNOSIS — Z7901 Long term (current) use of anticoagulants: Secondary | ICD-10-CM

## 2016-05-14 DIAGNOSIS — Z5181 Encounter for therapeutic drug level monitoring: Secondary | ICD-10-CM | POA: Diagnosis not present

## 2016-05-14 DIAGNOSIS — L989 Disorder of the skin and subcutaneous tissue, unspecified: Secondary | ICD-10-CM

## 2016-05-14 LAB — PROTIME-INR
INR: 2.3 ratio — ABNORMAL HIGH (ref 0.8–1.0)
PROTHROMBIN TIME: 24.4 s — AB (ref 9.6–13.1)

## 2016-05-18 ENCOUNTER — Telehealth: Payer: Self-pay | Admitting: Pharmacist

## 2016-05-18 NOTE — Telephone Encounter (Signed)
Called patient to discuss Warfarin dose change per Dr Lacinda Axon. Informed patient that his INR remained subtherapeutic at 2.3. Instructed patient to take two 7.5 mg tablets today then take 1 & 1/2 tablets (7.5 mg) on Monday, Wednesday and Friday and 1 tablet (7.5 mg) all other days.  Patient demonstrated understanding of the medication dose change. Patient denied s/sx of bleeding except that he had one bruise on his inner thigh.  Instructed him to call clinic if the bruise increased in size significantly and to continue to monitor for s/sx of bleeding.  Bennye Alm, PharmD [facility] PGY2 Pharmacy Resident [phone]

## 2016-05-19 NOTE — Telephone Encounter (Signed)
A MyChart message was sent to pt with instructions

## 2016-05-22 ENCOUNTER — Other Ambulatory Visit: Payer: Self-pay | Admitting: Family Medicine

## 2016-06-10 ENCOUNTER — Telehealth: Payer: Self-pay | Admitting: Radiology

## 2016-06-10 NOTE — Telephone Encounter (Signed)
Pt coming in for labs tomorrow, please place orders

## 2016-06-10 NOTE — Telephone Encounter (Signed)
Its just an INR.

## 2016-06-11 ENCOUNTER — Other Ambulatory Visit (INDEPENDENT_AMBULATORY_CARE_PROVIDER_SITE_OTHER): Payer: PPO

## 2016-06-11 ENCOUNTER — Telehealth: Payer: Self-pay | Admitting: Family Medicine

## 2016-06-11 DIAGNOSIS — Z7901 Long term (current) use of anticoagulants: Secondary | ICD-10-CM | POA: Diagnosis not present

## 2016-06-11 DIAGNOSIS — Z5181 Encounter for therapeutic drug level monitoring: Secondary | ICD-10-CM

## 2016-06-11 LAB — PROTIME-INR
INR: 3 ratio — AB (ref 0.8–1.0)
Prothrombin Time: 32.9 s — ABNORMAL HIGH (ref 9.6–13.1)

## 2016-06-11 NOTE — Telephone Encounter (Signed)
The Repatha rep returned call and I will have samples for patient to try tomorrow afternoon bar any weather delays. Then will work on authorization, for insurance so we can obtain script.

## 2016-06-11 NOTE — Telephone Encounter (Signed)
Called Repatha rep left message to give me a call back to find out why the repatha approval has not been respondd to.

## 2016-06-15 ENCOUNTER — Telehealth: Payer: Self-pay | Admitting: Family Medicine

## 2016-06-15 NOTE — Telephone Encounter (Signed)
This is the patient that is currently trying to get repatha. We have not heard anything in regards to the prior authorization or forms sent to the drug company its self. Pt had questions of when he'd be able to receive this medication.

## 2016-06-29 ENCOUNTER — Telehealth: Payer: Self-pay | Admitting: Family Medicine

## 2016-06-29 NOTE — Telephone Encounter (Signed)
Pt was called and told that we had received samples of the repathat medication. Pt was told that the PCP wanted to talk to repatha rep to see what we can do to get medication approved or get continues samples.

## 2016-06-30 NOTE — Telephone Encounter (Signed)
LVTCB

## 2016-06-30 NOTE — Telephone Encounter (Signed)
Pt called back and was schedule for labs on Friday 07/03/16. Pt was made aware of process in getting pt approved for medication and he agreed.

## 2016-06-30 NOTE — Telephone Encounter (Signed)
We to schedule patient for new lipid draw.

## 2016-06-30 NOTE — Telephone Encounter (Signed)
Okay by me.

## 2016-07-02 ENCOUNTER — Other Ambulatory Visit: Payer: Self-pay | Admitting: Family Medicine

## 2016-07-02 ENCOUNTER — Telehealth: Payer: Self-pay | Admitting: Radiology

## 2016-07-02 DIAGNOSIS — E785 Hyperlipidemia, unspecified: Secondary | ICD-10-CM

## 2016-07-02 DIAGNOSIS — H43813 Vitreous degeneration, bilateral: Secondary | ICD-10-CM | POA: Diagnosis not present

## 2016-07-02 NOTE — Telephone Encounter (Signed)
Pt coming in for labs tomorrow, please place orders. Thank you 

## 2016-07-03 ENCOUNTER — Other Ambulatory Visit (INDEPENDENT_AMBULATORY_CARE_PROVIDER_SITE_OTHER): Payer: PPO

## 2016-07-03 DIAGNOSIS — E785 Hyperlipidemia, unspecified: Secondary | ICD-10-CM | POA: Diagnosis not present

## 2016-07-03 LAB — LIPID PANEL
CHOLESTEROL: 173 mg/dL (ref 0–200)
HDL: 56.6 mg/dL (ref >39.00)
LDL Cholesterol: 93 mg/dL (ref 0–99)
NonHDL: 116.2
TRIGLYCERIDES: 114 mg/dL (ref 0.0–149.0)
Total CHOL/HDL Ratio: 3
VLDL: 22.8 mg/dL (ref 0.0–40.0)

## 2016-07-04 ENCOUNTER — Other Ambulatory Visit: Payer: Self-pay | Admitting: Family Medicine

## 2016-07-07 ENCOUNTER — Telehealth: Payer: Self-pay | Admitting: Family Medicine

## 2016-07-07 NOTE — Telephone Encounter (Signed)
Hopefully we can stop and get him on the repatha. Co Q 10 daily. Stop multivitamin.

## 2016-07-07 NOTE — Telephone Encounter (Signed)
Per DPr the pts wife was given information. She gave verbal understanding. Pt will stop statin.

## 2016-07-07 NOTE — Telephone Encounter (Signed)
Pt spouse called and stated that pt is having a lot of body aches due to the statins that he is taking. They want to know how they can adjust vitamins, he is taking a multi-vitamin that has a lot of vitamin K. Please advise, thank you!  Call pt @ [phone]

## 2016-07-08 MED ORDER — EVOLOCUMAB 140 MG/ML ~~LOC~~ SOSY: 140.0000 mg | PREFILLED_SYRINGE | SUBCUTANEOUS | 3 refills | Status: DC

## 2016-07-08 NOTE — Addendum Note (Signed)
Addended by: Nanci Pina on: 07/08/2016 11:24 AM   Modules accepted: Orders

## 2016-07-08 NOTE — Telephone Encounter (Addendum)
I have printed lab and the Repatia script to be faxed to [facility] I need for Dr. Lacinda Axon to sign this script. Please return to me.

## 2016-07-09 ENCOUNTER — Other Ambulatory Visit: Payer: Self-pay | Admitting: Radiology

## 2016-07-09 ENCOUNTER — Telehealth: Payer: Self-pay | Admitting: Radiology

## 2016-07-09 DIAGNOSIS — Z7901 Long term (current) use of anticoagulants: Principal | ICD-10-CM

## 2016-07-09 DIAGNOSIS — Z5181 Encounter for therapeutic drug level monitoring: Secondary | ICD-10-CM

## 2016-07-09 NOTE — Telephone Encounter (Signed)
Disregard prev message.

## 2016-07-09 NOTE — Telephone Encounter (Signed)
Repatha faxed to [facility] this date awaiting reply.

## 2016-07-09 NOTE — Telephone Encounter (Signed)
Pt coming in for labs Monday, please place orders. Thank you.

## 2016-07-13 ENCOUNTER — Other Ambulatory Visit (INDEPENDENT_AMBULATORY_CARE_PROVIDER_SITE_OTHER): Payer: PPO

## 2016-07-13 DIAGNOSIS — Z5181 Encounter for therapeutic drug level monitoring: Secondary | ICD-10-CM | POA: Diagnosis not present

## 2016-07-13 DIAGNOSIS — Z7901 Long term (current) use of anticoagulants: Secondary | ICD-10-CM

## 2016-07-13 LAB — PROTIME-INR
INR: 2.2 ratio — ABNORMAL HIGH (ref 0.8–1.0)
PROTHROMBIN TIME: 23.5 s — AB (ref 9.6–13.1)

## 2016-07-15 ENCOUNTER — Telehealth: Payer: Self-pay | Admitting: Family Medicine

## 2016-07-15 ENCOUNTER — Other Ambulatory Visit: Payer: Self-pay | Admitting: Family Medicine

## 2016-07-15 NOTE — Telephone Encounter (Signed)
Pt wife called back to give the dosage for medication warfarin (COUMADIN) 7.5 MG tablet Mon,Wed and fri 10mg . And the other days it's the 7.5 mg.   Call wife @ [phone]. Thank you!

## 2016-07-15 NOTE — Telephone Encounter (Signed)
pts wife was given results.

## 2016-07-15 NOTE — Telephone Encounter (Signed)
Historical medication. Looks to have been discontinued. Please advise?

## 2016-07-15 NOTE — Telephone Encounter (Signed)
Pt called back about Evolocumab (REPATHA) 140 MG/ML SOSY. Please call pt back regarding medication.   Call pt @ [phone]. Thank you!

## 2016-07-16 NOTE — Telephone Encounter (Signed)
Let patient [facility] will contact him with financial help for Repatha

## 2016-07-16 NOTE — Telephone Encounter (Signed)
Go ahead and have him come in for samples.

## 2016-07-16 NOTE — Telephone Encounter (Signed)
Pharmacy approved medication but co-pay for medication is $300. Pt stated that he is unabel to afford that is there anything more we can do in regards to getting it cheaper or samples?

## 2016-07-17 ENCOUNTER — Telehealth: Payer: Self-pay | Admitting: Family Medicine

## 2016-07-17 NOTE — Telephone Encounter (Signed)
Pt called and asked that you call him back. He has a couple of questions in regards to him Evolocumab (REPATHA) 140 MG/ML SOSY. Please advise, thank you!  Call pt @ [phone]

## 2016-07-17 NOTE — Telephone Encounter (Signed)
Pt called stating that he was giving some contact numbers for companies that help with co-pay and the manufacturers number. Pt was told to call the companies that will help with co=pay before manufacturer. He agreed and was told to let us know what they respond with.

## 2016-07-17 NOTE — Telephone Encounter (Signed)
[facility] advised of [facility] contacting [facility]. PCP asked for pt to be scheduled for nurse visit to be given repatha sample. Pt is scheduled for 07/23/16 at 3 p.m. Pt schedule repeat INR/PT for 08/13/16 @ 1130 a.m.

## 2016-07-20 ENCOUNTER — Telehealth: Payer: Self-pay | Admitting: *Deleted

## 2016-07-20 NOTE — Telephone Encounter (Signed)
Coronary artery disease, Hyperlipidemia.

## 2016-07-20 NOTE — Telephone Encounter (Signed)
Pt requested a diagnosis for the reason he will need take the repatha  Pt contact [phone]

## 2016-07-20 NOTE — Telephone Encounter (Signed)
Per DPR pts wife was called and given diagnosis

## 2016-07-23 ENCOUNTER — Ambulatory Visit (INDEPENDENT_AMBULATORY_CARE_PROVIDER_SITE_OTHER): Payer: PPO

## 2016-07-23 DIAGNOSIS — L821 Other seborrheic keratosis: Secondary | ICD-10-CM | POA: Diagnosis not present

## 2016-07-23 DIAGNOSIS — E785 Hyperlipidemia, unspecified: Secondary | ICD-10-CM | POA: Diagnosis not present

## 2016-07-23 DIAGNOSIS — L218 Other seborrheic dermatitis: Secondary | ICD-10-CM | POA: Diagnosis not present

## 2016-07-23 DIAGNOSIS — X32XXXA Exposure to sunlight, initial encounter: Secondary | ICD-10-CM | POA: Diagnosis not present

## 2016-07-23 DIAGNOSIS — D485 Neoplasm of uncertain behavior of skin: Secondary | ICD-10-CM | POA: Diagnosis not present

## 2016-07-23 DIAGNOSIS — C44319 Basal cell carcinoma of skin of other parts of face: Secondary | ICD-10-CM | POA: Diagnosis not present

## 2016-07-23 DIAGNOSIS — L570 Actinic keratosis: Secondary | ICD-10-CM | POA: Diagnosis not present

## 2016-07-24 NOTE — Progress Notes (Signed)
Care was provided under my supervision. I agree with the management as indicated in the note.  Tegan Britain DO  

## 2016-07-24 NOTE — Progress Notes (Signed)
Patient comes in for Repatha injection .Marland Kitchen See verbal orders dated 07/16/16 from Dr. Lacinda Axon per telephone documentation.     Patient was instructed on how to give himself injection and he verbalized an understanding on how to inject himself.   Injected the Repatha 140 mg  sample injection subcutaneously into abdomen right  LOT # [id number] EXP 2/20.  Patient tolerated injection well.   Patient was given additional Repatha samples to administer every 2 two weeks.

## 2016-07-28 ENCOUNTER — Telehealth: Payer: Self-pay | Admitting: *Deleted

## 2016-07-28 NOTE — Telephone Encounter (Signed)
Would avoid NSAIDs. If he need something stronger than tylenol, I'm happy to help.

## 2016-07-28 NOTE — Telephone Encounter (Signed)
Which ine would be best for pt to take with warfarin?

## 2016-07-28 NOTE — Telephone Encounter (Signed)
Was advises after a root canile, to take tylenol and aleve at the same time for pain. Pt's wife requested call  to discuss if pt can take both medication, due to him presently taking warfarin.  Contact [name] [phone]

## 2016-07-29 NOTE — Telephone Encounter (Signed)
Wife called back and stated pt has not been taking any medication for pain he is able to handle what pain he is experiencing.

## 2016-07-29 NOTE — Telephone Encounter (Signed)
Per DPR a vociemail was left with Dr.Cooks recommendations. Pt was told to callback if they had any questions.

## 2016-08-06 ENCOUNTER — Telehealth: Payer: Self-pay | Admitting: Family Medicine

## 2016-08-06 HISTORY — PX: SKIN CANCER EXCISION: SHX779

## 2016-08-06 NOTE — Telephone Encounter (Signed)
Pt requested a call [phone]

## 2016-08-06 NOTE — Telephone Encounter (Signed)
Pt was called and told to bring in the insurance approval letter so that it could be faxed to [place]. Pt stated he would have to look for it but would bring in once he found it.

## 2016-08-06 NOTE — Telephone Encounter (Signed)
Left message for patient to return call to office concerning Repatha. [employer] needs copy of the authorization from patient insurance.

## 2016-08-07 NOTE — Telephone Encounter (Signed)
Pt souse called back very upset that she cannot get in touch with you and that she was been on the phone for 3 hours since yesterday to try and get this problem resolved.   Call pt @ [phone]

## 2016-08-07 NOTE — Telephone Encounter (Signed)
Awaiting documentation

## 2016-08-07 NOTE — Telephone Encounter (Signed)
Pts wife was called and she stated that she had to call insurance to get a new letter sent. They are awaiting on this before bringing to office.

## 2016-08-11 ENCOUNTER — Other Ambulatory Visit: Payer: Self-pay | Admitting: Radiology

## 2016-08-11 DIAGNOSIS — Z7901 Long term (current) use of anticoagulants: Principal | ICD-10-CM

## 2016-08-11 DIAGNOSIS — Z5181 Encounter for therapeutic drug level monitoring: Secondary | ICD-10-CM

## 2016-08-13 ENCOUNTER — Other Ambulatory Visit (INDEPENDENT_AMBULATORY_CARE_PROVIDER_SITE_OTHER): Payer: PPO

## 2016-08-13 ENCOUNTER — Telehealth: Payer: Self-pay | Admitting: Family Medicine

## 2016-08-13 ENCOUNTER — Telehealth: Payer: Self-pay | Admitting: *Deleted

## 2016-08-13 DIAGNOSIS — Z7901 Long term (current) use of anticoagulants: Secondary | ICD-10-CM | POA: Diagnosis not present

## 2016-08-13 DIAGNOSIS — Z5181 Encounter for therapeutic drug level monitoring: Secondary | ICD-10-CM | POA: Diagnosis not present

## 2016-08-13 LAB — PROTIME-INR
INR: 5.7 ratio — AB (ref 0.8–1.0)
Prothrombin Time: 63 s (ref 9.6–13.1)

## 2016-08-13 NOTE — Telephone Encounter (Signed)
I have the insurance authorization to [place] with a copy of the denial , and awaiting response at this time per [employer] patient should receive approval for assistance with Repatha. FYI

## 2016-08-13 NOTE — Telephone Encounter (Signed)
CRITICAL LABS:  PT: 63 INR: 5.7  Per Gill @ [facility]

## 2016-08-17 ENCOUNTER — Other Ambulatory Visit: Payer: PPO

## 2016-08-17 ENCOUNTER — Other Ambulatory Visit: Payer: Self-pay | Admitting: Radiology

## 2016-08-17 DIAGNOSIS — Z7901 Long term (current) use of anticoagulants: Principal | ICD-10-CM

## 2016-08-17 DIAGNOSIS — Z5181 Encounter for therapeutic drug level monitoring: Secondary | ICD-10-CM

## 2016-08-18 ENCOUNTER — Other Ambulatory Visit (INDEPENDENT_AMBULATORY_CARE_PROVIDER_SITE_OTHER): Payer: PPO

## 2016-08-18 ENCOUNTER — Telehealth: Payer: Self-pay | Admitting: Family Medicine

## 2016-08-18 DIAGNOSIS — Z7901 Long term (current) use of anticoagulants: Secondary | ICD-10-CM

## 2016-08-18 DIAGNOSIS — Z5181 Encounter for therapeutic drug level monitoring: Secondary | ICD-10-CM | POA: Diagnosis not present

## 2016-08-18 LAB — PROTIME-INR
INR: 1.2 ratio — ABNORMAL HIGH (ref 0.8–1.0)
Prothrombin Time: 12.3 s (ref 9.6–13.1)

## 2016-08-19 ENCOUNTER — Other Ambulatory Visit: Payer: Self-pay | Admitting: Family Medicine

## 2016-08-19 DIAGNOSIS — I480 Paroxysmal atrial fibrillation: Secondary | ICD-10-CM

## 2016-08-19 DIAGNOSIS — E785 Hyperlipidemia, unspecified: Secondary | ICD-10-CM

## 2016-08-20 ENCOUNTER — Other Ambulatory Visit: Payer: Self-pay | Admitting: Family Medicine

## 2016-08-20 MED ORDER — WARFARIN SODIUM 5 MG PO TABS: 5.0000 mg | ORAL_TABLET | Freq: Every day | ORAL | 3 refills | Status: DC

## 2016-08-24 DIAGNOSIS — M75121 Complete rotator cuff tear or rupture of right shoulder, not specified as traumatic: Secondary | ICD-10-CM | POA: Diagnosis not present

## 2016-08-28 ENCOUNTER — Other Ambulatory Visit: Payer: PPO

## 2016-08-28 ENCOUNTER — Other Ambulatory Visit (INDEPENDENT_AMBULATORY_CARE_PROVIDER_SITE_OTHER): Payer: PPO

## 2016-08-28 DIAGNOSIS — E785 Hyperlipidemia, unspecified: Secondary | ICD-10-CM

## 2016-08-28 DIAGNOSIS — I480 Paroxysmal atrial fibrillation: Secondary | ICD-10-CM | POA: Diagnosis not present

## 2016-08-28 LAB — LIPID PANEL
CHOL/HDL RATIO: 3
Cholesterol: 146 mg/dL (ref 0–200)
HDL: 58.3 mg/dL (ref >39.00)
LDL CALC: 70 mg/dL (ref 0–99)
NonHDL: 88.01
TRIGLYCERIDES: 91 mg/dL (ref 0.0–149.0)
VLDL: 18.2 mg/dL (ref 0.0–40.0)

## 2016-08-28 LAB — PROTIME-INR
INR: 2.8 ratio — AB (ref 0.8–1.0)
Prothrombin Time: 30 s — ABNORMAL HIGH (ref 9.6–13.1)

## 2016-08-30 NOTE — Progress Notes (Signed)
Cardiology Office Note  Date:  08/31/2016   ID:  [name], DOB [birth year], MRN [id number]  PCP:  Coral Spikes, DO   Chief Complaint  Patient presents with  . other    6 month follow up. Meds reviewed by the pt.'s med list. "doing well."     HPI:  71 y/o male with a history of PAF, prior stroke treated with TPA with mild residual left sided weakness, Depression with chronic fatigue, chronic anticoagulation therapy with Coumadin and h/o CAD s/p PCI to the LCx in 2007. He reports mini stroke in 2015 seen on PET scan, did not have symptoms and was on Coumadin at the time. Heavy ETOH, wine per night 3 to 5+ per night  Tired, fatigue, poor sleep presents with his wife Walks 15 min per day, dogs Sleeps in 11 AM chronic fatigue Wife reports that he can sleep 12 hours and still be tired with no energy  long-standing issue dating back many years  no regular exercise program. Sx worse since CVA  Recent fall going to bed, fell off the edge of the bed hit his left thigh on the nightstand Wife reports he had been drinking  TSH normal last year  Wife also reports that he is weak, now she has to open jars and cans in the kitchen  Other new issues include a Retinal tear, needed laser treatment Concerned it was brought on by the Coumadin, ophthalmology did not think so per the patient  Previous stress test again reviewed with him showing no ischemia Holter monitor reviewed with him showing APCs, PVCs, very short runs of atrial tachycardia up to 6 beats  He reports that he does appreciate these palpitations at times, sometimes certain days worse than others   EKG on today's visit shows normal sinus rhythm with rate 57 bpm, no significant ST or T-wave changes  Other past medical history He does report having myalgias on Crestor and higher dose Lipitor Previous total cholesterol 174, LDL 93  PMH:   has a past medical history of A-fib ([place]); BPH (benign prostatic hyperplasia);  Coronary artery disease; Depression; GERD (gastroesophageal reflux disease); Hyperlipidemia; Hypertension; IBS (irritable bowel syndrome); IBS (irritable bowel syndrome); PNA (pneumonia); Skin cancer; and Stroke ([place]).  PSH:    Past Surgical History:  Procedure Laterality Date  . ADENOIDECTOMY  1951  . CARDIAC CATHETERIZATION  2006   X1 STENT  . THYROIDECTOMY, PARTIAL      Current Outpatient Prescriptions  Medication Sig Dispense Refill  . aspirin 81 MG tablet Take 81 mg by mouth daily.    . bisoprolol (ZEBETA) 5 MG tablet TAKE 1 TABLET (5 MG TOTAL) BY MOUTH DAILY. 90 tablet 3  . Cholecalciferol (VITAMIN D) 2000 UNITS CAPS Take 2,000 Units by mouth 2 (two) times daily.     Marland Kitchen dicyclomine (BENTYL) 10 MG capsule TAKE 1 CAPSULE (10 MG TOTAL) BY MOUTH 2 (TWO) TIMES DAILY. 90 capsule 3  . escitalopram (LEXAPRO) 20 MG tablet Take 1 tablet (20 mg total) by mouth daily. 180 tablet 2  . Evolocumab (REPATHA) 140 MG/ML SOSY Inject 140 mg into the skin every 14 (fourteen) days. 2 mL 3  . hydrochlorothiazide (MICROZIDE) 12.5 MG capsule Take 12.5 mg by mouth daily.     Marland Kitchen lisinopril (PRINIVIL,ZESTRIL) 20 MG tablet TAKE 1 TABLET BY MOUTH TWICE A DAY 180 tablet 3  . nitroGLYCERIN (NITROSTAT) 0.4 MG SL tablet Place 1 tablet (0.4 mg total) under the tongue every 5 (five) minutes as needed  for chest pain. 25 tablet 3  . ranitidine (ZANTAC) 150 MG tablet Take 1 tablet (150 mg total) by mouth 2 (two) times daily. 180 tablet 11  . vitamin B-12 (CYANOCOBALAMIN) 1000 MCG tablet Take 1,000 mcg by mouth daily.    Marland Kitchen warfarin (COUMADIN) 5 MG tablet Take 1 tablet (5 mg total) by mouth daily. 30 tablet 3  . warfarin (COUMADIN) 7.5 MG tablet 1.5 tablets on Monday and Friday. Remainder of week 1 tablet daily. 90 tablet 3   No current facility-administered medications for this visit.      Allergies:   Bactrim [sulfamethoxazole-trimethoprim] and Crestor [rosuvastatin calcium]   Social History:  The patient  reports  that he has quit smoking. He has never used smokeless tobacco. He reports that he drinks about 1.2 - 1.8 oz of alcohol per week . He reports that he does not use drugs.   Family History:   family history includes Arthritis in his mother; Cancer in his maternal aunt and mother; Heart Problems in his father; Heart disease in his father, paternal grandfather, and paternal grandmother; Hypertension in his father; Mental illness in his mother; Stroke in his maternal grandmother and paternal uncle; Sudden death in his maternal grandfather.    Review of Systems: Review of Systems  Constitutional: Positive for malaise/fatigue.  Respiratory: Negative.   Cardiovascular: Negative.   Gastrointestinal: Negative.   Musculoskeletal: Negative.   Neurological: Positive for weakness.  Psychiatric/Behavioral: Negative.   All other systems reviewed and are negative.    PHYSICAL EXAM: VS:  BP 120/80 (BP Location: Left Arm, Patient Position: Sitting, Cuff Size: Normal)   Pulse (!) 57   Ht 6\' 1"  (1.854 m)   Wt 186 lb 8 oz (84.6 kg)   BMI 24.61 kg/m  , BMI Body mass index is 24.61 kg/m. GEN: Well nourished, well developed, in no acute distress, Ecchymosis under his left eye  HEENT: normal  Neck: no JVD, carotid bruits, or masses Cardiac: RRR; no murmurs, rubs, or gallops,no edema  Respiratory:  clear to auscultation bilaterally, normal work of breathing GI: soft, nontender, nondistended, + BS MS: no deformity or atrophy  Skin: warm and dry, no rash Neuro:  Strength and sensation are intact Psych: euthymic mood, full affect    Recent Labs: 02/04/2016: ALT 30; BUN 14; Creatinine, Ser 0.89; Hemoglobin 14.3; Platelets 171.0; Potassium 4.2; Sodium 140    Lipid Panel Lab Results  Component Value Date   CHOL 146 08/28/2016   HDL 58.30 08/28/2016   LDLCALC 70 08/28/2016   TRIG 91.0 08/28/2016      Wt Readings from Last 3 Encounters:  08/31/16 186 lb 8 oz (84.6 kg)  03/03/16 183 lb 8 oz (83.2  kg)  02/11/16 184 lb 4 oz (83.6 kg)       ASSESSMENT AND PLAN:  Paroxysmal atrial fibrillation ([place]) - Plan: EKG 12-Lead On anticoagulation Maintaining normal sinus rhythm, no changes to his medications  Coronary artery disease involving native coronary artery of native heart without angina pectoris - Plan: EKG 12-Lead Currently with no symptoms of angina. No further workup at this time. Continue current medication regimen.  Mixed hyperlipidemia - Plan: EKG 12-Lead Cholesterol is at goal on the current lipid regimen. No changes to the medications were made.  Essential hypertension - Plan: EKG 12-Lead Blood pressure is well controlled on today's visit. No changes made to the medications.  Cerebrovascular accident (CVA) due to embolism of precerebral artery ([place]) - Plan: EKG 12-Lead Maintain on anticoagulation More depressed  since the stroke  Encounter for anticoagulation discussion and counseling - Plan: EKG 12-Lead Recent fall, trauma to his left eye with ecchymosis Recommended he stopped drinking  Alcohol abuse , Alcohol cessation recommended, likely contributing to depression, poor sleep hygiene  Depression We'll make referral to therapist Symptoms getting worse, wife who presents with him today reports he is sleeping until 11:00 in the morning, drinking more excessively at nighttime, no motivation to do anything, sits on the couch all day. She is very frustrated Previously seen by psychiatry in [place] but he died from heart attack We will check testosterone level, TSH  Long discussion today concerning his depression symptoms  Total encounter time more than 45 minutes  Greater than 50% was spent in counseling and coordination of care with the patient   Disposition:   F/U  12 months   Orders Placed This Encounter  Procedures  . EKG 12-Lead     Signed, Esmond Plants, M.D., Ph.D. 08/31/2016  [place], [place]

## 2016-08-31 ENCOUNTER — Telehealth: Payer: Self-pay | Admitting: Cardiovascular Disease

## 2016-08-31 ENCOUNTER — Ambulatory Visit (INDEPENDENT_AMBULATORY_CARE_PROVIDER_SITE_OTHER): Payer: PPO | Admitting: Cardiovascular Disease

## 2016-08-31 ENCOUNTER — Encounter: Payer: Self-pay | Admitting: Cardiovascular Disease

## 2016-08-31 VITALS — BP 120/80 | HR 57 | Ht 73.0 in | Wt 186.5 lb

## 2016-08-31 DIAGNOSIS — Z7189 Other specified counseling: Secondary | ICD-10-CM

## 2016-08-31 DIAGNOSIS — I10 Essential (primary) hypertension: Secondary | ICD-10-CM | POA: Diagnosis not present

## 2016-08-31 DIAGNOSIS — I6310 Cerebral infarction due to embolism of unspecified precerebral artery: Secondary | ICD-10-CM | POA: Diagnosis not present

## 2016-08-31 DIAGNOSIS — I2510 Atherosclerotic heart disease of native coronary artery without angina pectoris: Secondary | ICD-10-CM

## 2016-08-31 DIAGNOSIS — R5383 Other fatigue: Secondary | ICD-10-CM | POA: Diagnosis not present

## 2016-08-31 DIAGNOSIS — E782 Mixed hyperlipidemia: Secondary | ICD-10-CM

## 2016-08-31 DIAGNOSIS — I480 Paroxysmal atrial fibrillation: Secondary | ICD-10-CM | POA: Diagnosis not present

## 2016-08-31 NOTE — Telephone Encounter (Signed)
Pt wife calling, states when they left here, she was reviewing pt medication list via the AVS, and she states his Warfarin medication is documented wrong.

## 2016-08-31 NOTE — Telephone Encounter (Signed)
Spoke w/ pt's wife.  Advised her that the coumadin clinic doses this and that pt's med lists are not typically updated w/ each dosage change. She will call pt's PCP w/ any questions regarding coumadin, as pt has this monitored at Dr. Jonathon Jordan office due to cost.

## 2016-08-31 NOTE — Patient Instructions (Addendum)
Medication Instructions:   No medication changes made  Labwork:  TSH, testosterone  Referrals:  We will send a referral to Dr. Karleen Dolphin office  Please call the New Patient Appointment Line at your convenience [phone] Please have your ins info ready when you place this call  I recommend watching educational videos on topics of interest to you at:       [email or web address]  Enter code: HEARTCARE    Follow-Up: It was a pleasure seeing you in the office today. Please call us if you have new issues that need to be addressed before your next appt.  [phone]  Your physician wants you to follow-up in: 6 months.  You will receive a reminder letter in the mail two months in advance. If you don't receive a letter, please call our office to schedule the follow-up appointment.  If you need a refill on your cardiac medications before your next appointment, please call your pharmacy.

## 2016-09-01 LAB — TSH: TSH: 2.08 u[IU]/mL (ref 0.450–4.500)

## 2016-09-01 LAB — TESTOSTERONE: Testosterone: 497 ng/dL (ref 264–916)

## 2016-09-02 NOTE — Telephone Encounter (Signed)
Approved for repatha patient notified.

## 2016-09-08 ENCOUNTER — Telehealth: Payer: Self-pay | Admitting: Family Medicine

## 2016-09-08 NOTE — Telephone Encounter (Signed)
Pt called about how to get his repatha from drug store. I told him that we would call back to confirm.

## 2016-09-08 NOTE — Telephone Encounter (Signed)
Notified patient that he needs to contact [employer]  To schedule and give authorization to receive Repatha home delivery. Contact [phone] Case Number [id number].

## 2016-09-17 ENCOUNTER — Other Ambulatory Visit: Payer: Self-pay | Admitting: Family Medicine

## 2016-09-24 DIAGNOSIS — L905 Scar conditions and fibrosis of skin: Secondary | ICD-10-CM | POA: Diagnosis not present

## 2016-09-24 DIAGNOSIS — C44319 Basal cell carcinoma of skin of other parts of face: Secondary | ICD-10-CM | POA: Diagnosis not present

## 2016-09-30 ENCOUNTER — Other Ambulatory Visit: Payer: Self-pay | Admitting: Radiology

## 2016-09-30 ENCOUNTER — Other Ambulatory Visit (INDEPENDENT_AMBULATORY_CARE_PROVIDER_SITE_OTHER): Payer: PPO

## 2016-09-30 ENCOUNTER — Telehealth: Payer: Self-pay | Admitting: Family Medicine

## 2016-09-30 ENCOUNTER — Telehealth: Payer: Self-pay | Admitting: Radiology

## 2016-09-30 DIAGNOSIS — Z7901 Long term (current) use of anticoagulants: Secondary | ICD-10-CM

## 2016-09-30 DIAGNOSIS — Z5181 Encounter for therapeutic drug level monitoring: Secondary | ICD-10-CM

## 2016-09-30 LAB — PROTIME-INR
INR: 3.2 ratio — AB (ref 0.8–1.0)
Prothrombin Time: 35.2 s — ABNORMAL HIGH (ref 9.6–13.1)

## 2016-09-30 NOTE — Telephone Encounter (Signed)
Its just an INR. Place the order for me. Make it recurring (Q Month).

## 2016-09-30 NOTE — Telephone Encounter (Signed)
Pt coming for labs on Friday, please place future orders. Thank you.  

## 2016-09-30 NOTE — Telephone Encounter (Signed)
Pt is requesting to have today's lab results call to [facility], Dr. Deliah Boston. Phone number is [phone]. Having a procedure done tomorrow (10/01/16) at 10:30.  Thanks

## 2016-10-01 DIAGNOSIS — L905 Scar conditions and fibrosis of skin: Secondary | ICD-10-CM | POA: Diagnosis not present

## 2016-10-01 DIAGNOSIS — C44319 Basal cell carcinoma of skin of other parts of face: Secondary | ICD-10-CM | POA: Diagnosis not present

## 2016-10-01 NOTE — Telephone Encounter (Signed)
Amy from Dr. Roetta Sessions office called and is requesting pt/inr results. Please advise, thank you!  Fax - Attn: QFD [phone]

## 2016-10-01 NOTE — Telephone Encounter (Signed)
Patient requested PT/INR results  Pt contact [phone]

## 2016-10-01 NOTE — Telephone Encounter (Signed)
Lab faxed

## 2016-10-02 ENCOUNTER — Other Ambulatory Visit: Payer: PPO

## 2016-10-12 ENCOUNTER — Telehealth: Payer: Self-pay | Admitting: Family Medicine

## 2016-10-12 NOTE — Telephone Encounter (Signed)
Dr. Nicolasa Ducking is great.

## 2016-10-12 NOTE — Telephone Encounter (Signed)
Pt called and was told Dr.Cook's comments.

## 2016-10-12 NOTE — Telephone Encounter (Signed)
Pt called and would like to know of any psychiatrist that he would recommend. He has a tentative appt with Dr. Nicolasa Ducking, and wanted to get your opinion. Please advise, thank you!  Call pt @ [phone]

## 2016-10-22 ENCOUNTER — Other Ambulatory Visit: Payer: Self-pay | Admitting: Radiology

## 2016-10-22 ENCOUNTER — Ambulatory Visit (INDEPENDENT_AMBULATORY_CARE_PROVIDER_SITE_OTHER): Payer: PPO

## 2016-10-22 VITALS — BP 118/70 | HR 59 | Temp 98.2°F | Resp 12 | Ht 72.0 in | Wt 181.8 lb

## 2016-10-22 DIAGNOSIS — Z9189 Other specified personal risk factors, not elsewhere classified: Principal | ICD-10-CM

## 2016-10-22 DIAGNOSIS — Z0000 Encounter for general adult medical examination without abnormal findings: Secondary | ICD-10-CM

## 2016-10-22 DIAGNOSIS — Z1159 Encounter for screening for other viral diseases: Secondary | ICD-10-CM

## 2016-10-22 NOTE — Progress Notes (Signed)
Subjective:   [name] is a 71 y.o. male who presents for Medicare Annual/Subsequent preventive examination.  Review of Systems:  No ROS.  Medicare Wellness Visit. Cardiac Risk Factors include: advanced age (>57men, >64 women);male gender;hypertension     Objective:    Vitals: BP 118/70 (BP Location: Right Arm, Patient Position: Sitting, Cuff Size: Normal)   Pulse (!) 59   Temp 98.2 F (36.8 C) (Oral)   Resp 12   Ht 6' (1.829 m)   Wt 181 lb 12.8 oz (82.5 kg)   SpO2 96%   BMI 24.66 kg/m   Body mass index is 24.66 kg/m.  Tobacco History  Smoking Status  . Former Smoker  Smokeless Tobacco  . Never Used     Counseling given: Not Answered   Past Medical History:  Diagnosis Date  . A-fib ([place])   . BPH (benign prostatic hyperplasia)   . Coronary artery disease    Hx of MI; S/P PCI  . Depression   . GERD (gastroesophageal reflux disease)   . Hyperlipidemia   . Hypertension   . IBS (irritable bowel syndrome)   . IBS (irritable bowel syndrome)   . PNA (pneumonia)   . Skin cancer   . Stroke [facility])    Past Surgical History:  Procedure Laterality Date  . ADENOIDECTOMY  1951  . CARDIAC CATHETERIZATION  2006   X1 STENT  . SKIN CANCER EXCISION N/A 08/2016   [place].  [facility] (Dr. Deliah Boston)  . THYROIDECTOMY, PARTIAL     Family History  Problem Relation Age of Onset  . Heart Problems Father   . Heart disease Father   . Hypertension Father   . Mental illness Mother   . Arthritis Mother   . Cancer Mother        ovarian  . Stroke Paternal Uncle   . Heart disease Paternal Grandfather   . Cancer Maternal Aunt   . Stroke Maternal Grandmother   . Sudden death Maternal Grandfather   . Heart disease Paternal Grandmother    History  Sexual Activity  . Sexual activity: No    Outpatient Encounter Prescriptions as of 10/22/2016  Medication Sig  . aspirin 81 MG tablet Take 81 mg by mouth daily.  . bisoprolol (ZEBETA) 5 MG tablet TAKE 1  TABLET (5 MG TOTAL) BY MOUTH DAILY.  Marland Kitchen Cholecalciferol (VITAMIN D) 2000 UNITS CAPS Take 2,000 Units by mouth 2 (two) times daily.   Marland Kitchen dicyclomine (BENTYL) 10 MG capsule TAKE 1 CAPSULE (10 MG TOTAL) BY MOUTH 2 (TWO) TIMES DAILY.  Marland Kitchen escitalopram (LEXAPRO) 20 MG tablet Take 1 tablet (20 mg total) by mouth daily.  . Evolocumab (REPATHA [place]) Inject 1 application into the skin every 21 ( twenty-one) days.  . hydrochlorothiazide (MICROZIDE) 12.5 MG capsule Take 12.5 mg by mouth daily.   Marland Kitchen lisinopril (PRINIVIL,ZESTRIL) 20 MG tablet TAKE 1 TABLET BY MOUTH TWICE A DAY  . nitroGLYCERIN (NITROSTAT) 0.4 MG SL tablet Place 1 tablet (0.4 mg total) under the tongue every 5 (five) minutes as needed for chest pain.  . ranitidine (ZANTAC) 150 MG tablet TAKE 1 TABLET (150 MG TOTAL) BY MOUTH 2 (TWO) TIMES DAILY.  . vitamin B-12 (CYANOCOBALAMIN) 1000 MCG tablet Take 1,000 mcg by mouth daily.  Marland Kitchen warfarin (COUMADIN) 7.5 MG tablet 1.5 tablets on Monday and Friday. Remainder of week 1 tablet daily.  . [DISCONTINUED] Evolocumab (REPATHA) 140 MG/ML SOSY Inject 140 mg into the skin every 14 (fourteen) days.  . [DISCONTINUED]  warfarin (COUMADIN) 5 MG tablet Take 1 tablet (5 mg total) by mouth daily.   No facility-administered encounter medications on file as of 10/22/2016.     Activities of Daily Living In your present state of health, do you have any difficulty performing the following activities: 10/22/2016 10/23/2015  Hearing? N N  Vision? N N  Difficulty concentrating or making decisions? Tempie Donning  Walking or climbing stairs? N Y  Dressing or bathing? N N  Doing errands, shopping? N N  Preparing Food and eating ? N N  Using the Toilet? N N  In the past six months, have you accidently leaked urine? N Y  Do you have problems with loss of bowel control? N Y  Managing your Medications? Y N  Managing your Finances? N N  Housekeeping or managing your Housekeeping? N N  Some recent data might be hidden    Patient Care  Team: Coral Spikes, DO as PCP - General (Family Medicine) Minna Merritts, MD as Consulting Physician (Cardiology)   Assessment:    This is a routine wellness examination for [name]. The goal of the wellness visit is to assist the patient how to close the gaps in care and create a preventative care plan for the patient.   Taking calcium VIT D as appropriate/Osteoporosis risk reviewed.  Medications reviewed; taking without issues or barriers.  Safety issues reviewed; smoke detectors in the home. No firearms in the home.  Wears seatbelts when driving or riding with others. Patient does wear sunscreen or protective clothing when in direct sunlight. No violence in the home.  Depression- PHQ 2 &9 complete.  No signs/symptoms or verbal communication regarding little pleasure in doing things or feeling down.  He reports some depression with retiring and chronic fatigue with little to no energy.  Works 1 day a week for 7 hours.  Sleeps long hours.  No thoughts of self harm or harm towards others.  Time spent on this topic is 12 minutes. He has an upcoming appointment with Psychiatry (Dr. Nicolasa Ducking) 11/2016.  Patient is alert, normal appearance, oriented to person/place/and time. Correctly identified the president of the [place], recall of 0/3 words, and performing simple calculations.  Patient displays appropriate judgement and can read correct time from watch face.  No new identified risk were noted.  No failures at ADL's or IADL's.   BMI- discussed the importance of a healthy diet, water intake and exercise. Educational material provided.   Daily fluid intake: 2 cups of caffeine, 3 cups of water, 3 cups of wine  HTN- followed by PCP.  Dental- every 4 months.  Upper and lower partial.  Eye- Visual acuity not assessed per patient preference since they have regular follow up with the ophthalmologist.  Wears corrective lenses.  Hepatitis C Screening discussed. Educational material provided.     Patient Concerns: Hepatitis C Screening labs to be drawn at next lab appointment 11/2016. Lab added on.  Exercise Activities and Dietary recommendations Current Exercise Habits: Home exercise routine, Type of exercise: walking, Time (Minutes): 20, Frequency (Times/Week): 4, Weekly Exercise (Minutes/Week): 80, Intensity: Mild  Goals    . Healthy Lifestyle          Stay hydrated and drink plenty of fluids/water. Low carb foods.  Lean meats and vegetables. Stay active and continuing walking for exercise, increase as tolerated.        Fall Risk Fall Risk  10/22/2016 10/23/2015 02/08/2015  Falls in the past year? Yes No No  Number  falls in past yr: 2 or more - -  Injury with Fall? No - -  Risk for fall due to : Impaired balance/gait - -  Risk for fall due to (comments): Slipped on rug.  Rug placemat guard now in place to prevent future falls.  - -  Follow up Falls prevention discussed - -   Depression Screen PHQ 2/9 Scores 10/22/2016 10/23/2015 02/08/2015  PHQ - 2 Score 1 0 0  PHQ- 9 Score 7 - -    Cognitive Function MMSE - Mini Mental State Exam 10/23/2015  Orientation to time 5  Orientation to Place 5  Registration 3  Attention/ Calculation 5  Recall 0  Recall-comments Hx of stroke.  Difficulty remembering.  Language- name 2 objects 2  Language- repeat 1  Language- follow 3 step command 3  Language- read & follow direction 1  Write a sentence 1  Copy design 1  Total score 27     6CIT Screen 10/22/2016  What Year? 0 points  What month? 0 points  What time? 0 points  Count back from 20 0 points  Months in reverse 0 points  Repeat phrase 10 points  Total Score 10    Immunization History  Administered Date(s) Administered  . Influenza,inj,Quad PF,36+ Mos 02/08/2015, 02/11/2016  . Influenza-Unspecified 03/22/2010, 03/10/2011, 02/23/2012, 02/28/2013, 03/15/2014  . Pneumococcal Conjugate-13 09/04/2014  . Pneumococcal Polysaccharide-23 07/07/2004, 07/01/2011, 02/07/2013   . Tdap 07/07/2008  . Zoster 02/12/2011   Screening Tests Health Maintenance  Topic Date Due  . Hepatitis C Screening  May 08, 1946  . INFLUENZA VACCINE  01/06/2017  . TETANUS/TDAP  07/07/2018  . COLONOSCOPY  11/09/2022  . PNA vac Low Risk Adult  Addressed      Plan:    End of life planning; Advance aging; Advanced directives discussed. Copy of current HCPOA/Living Will on file.    I have personally reviewed and noted the following in the patient's chart:   . Medical and social history . Use of alcohol, tobacco or illicit drugs  . Current medications and supplements . Functional ability and status . Nutritional status . Physical activity . Advanced directives . List of other physicians . Hospitalizations, surgeries, and ER visits in previous 12 months . Vitals . Screenings to include cognitive, depression, and falls . Referrals and appointments  In addition, I have reviewed and discussed with patient certain preventive protocols, quality metrics, and best practice recommendations. A written personalized care plan for preventive services as well as general preventive health recommendations were provided to patient.     Varney Biles, LPN  [id number]

## 2016-10-22 NOTE — Progress Notes (Signed)
Care was provided under my supervision. I agree with the management as indicated in the note.  Taleeya Blondin DO  

## 2016-10-22 NOTE — Patient Instructions (Addendum)
  [name] , Thank you for taking time to come for your Medicare Wellness Visit. I appreciate your ongoing commitment to your health goals. Please review the following plan we discussed and let me know if I can assist you in the future.   Follow up with Dr. Lacinda Axon as needed.    Have a great day!  These are the goals we discussed: Goals    . Healthy Lifestyle          Stay hydrated and drink plenty of fluids/water. Low carb foods.  Lean meats and vegetables. Stay active and continuing walking for exercise, increase as tolerated.         This is a list of the screening recommended for you and due dates:  Health Maintenance  Topic Date Due  .  Hepatitis C: One time screening is recommended by Center for Disease Control  (CDC) for  adults born from [birth year] through 1965.   [birth year]  . Flu Shot  01/06/2017  . Tetanus Vaccine  07/07/2018  . Colon Cancer Screening  11/09/2022  . Pneumonia vaccines  Addressed

## 2016-11-03 ENCOUNTER — Other Ambulatory Visit (INDEPENDENT_AMBULATORY_CARE_PROVIDER_SITE_OTHER): Payer: PPO

## 2016-11-03 DIAGNOSIS — Z5181 Encounter for therapeutic drug level monitoring: Secondary | ICD-10-CM | POA: Diagnosis not present

## 2016-11-03 DIAGNOSIS — Z1159 Encounter for screening for other viral diseases: Secondary | ICD-10-CM

## 2016-11-03 DIAGNOSIS — Z7901 Long term (current) use of anticoagulants: Secondary | ICD-10-CM

## 2016-11-03 DIAGNOSIS — Z9189 Other specified personal risk factors, not elsewhere classified: Secondary | ICD-10-CM

## 2016-11-03 LAB — PROTIME-INR
INR: 3.6 ratio — ABNORMAL HIGH (ref 0.8–1.0)
Prothrombin Time: 38.1 s — ABNORMAL HIGH (ref 9.6–13.1)

## 2016-11-03 LAB — HEPATITIS C ANTIBODY: HCV Ab: NEGATIVE

## 2016-11-04 ENCOUNTER — Telehealth: Payer: Self-pay

## 2016-11-04 NOTE — Telephone Encounter (Signed)
Attempted to reach patient, left a VM. thanks 

## 2016-11-04 NOTE — Telephone Encounter (Signed)
-----   Message from Coral Spikes, DO sent at 11/03/2016 10:45 PM EDT ----- INR elevated. Decrease Warfarin to 7.5 mg daily. Recheck in 2 weeks.

## 2016-11-04 NOTE — Telephone Encounter (Signed)
Spoke with patient, advised of change in warfarin dosing.  Scheduled for follow up labs on 6/12. thanks

## 2016-11-17 ENCOUNTER — Other Ambulatory Visit: Payer: PPO

## 2016-11-19 ENCOUNTER — Other Ambulatory Visit (INDEPENDENT_AMBULATORY_CARE_PROVIDER_SITE_OTHER): Payer: PPO

## 2016-11-19 DIAGNOSIS — Z5181 Encounter for therapeutic drug level monitoring: Secondary | ICD-10-CM

## 2016-11-19 DIAGNOSIS — Z7901 Long term (current) use of anticoagulants: Secondary | ICD-10-CM | POA: Diagnosis not present

## 2016-11-19 LAB — PROTIME-INR
INR: 1.7 ratio — AB (ref 0.8–1.0)
PROTHROMBIN TIME: 18.8 s — AB (ref 9.6–13.1)

## 2016-11-20 ENCOUNTER — Telehealth: Payer: Self-pay | Admitting: Family Medicine

## 2016-11-20 NOTE — Telephone Encounter (Signed)
Patient advised of result see labs for documentation.

## 2016-11-20 NOTE — Telephone Encounter (Signed)
Pt called requesting inr results. Please advise, thank you!

## 2016-11-24 DIAGNOSIS — F330 Major depressive disorder, recurrent, mild: Secondary | ICD-10-CM | POA: Diagnosis not present

## 2016-11-24 DIAGNOSIS — F411 Generalized anxiety disorder: Secondary | ICD-10-CM | POA: Diagnosis not present

## 2016-12-04 ENCOUNTER — Other Ambulatory Visit (INDEPENDENT_AMBULATORY_CARE_PROVIDER_SITE_OTHER): Payer: PPO

## 2016-12-04 ENCOUNTER — Telehealth: Payer: Self-pay

## 2016-12-04 DIAGNOSIS — Z5181 Encounter for therapeutic drug level monitoring: Secondary | ICD-10-CM

## 2016-12-04 DIAGNOSIS — Z7901 Long term (current) use of anticoagulants: Secondary | ICD-10-CM

## 2016-12-04 LAB — PROTIME-INR
INR: 2.7 ratio — ABNORMAL HIGH (ref 0.8–1.0)
PROTHROMBIN TIME: 29.4 s — AB (ref 9.6–13.1)

## 2016-12-04 NOTE — Telephone Encounter (Signed)
It is every 2 weeks. It is okay to continue the following tues.

## 2016-12-04 NOTE — Telephone Encounter (Signed)
Patient advised of below and verbalized understanding.  

## 2016-12-04 NOTE — Telephone Encounter (Signed)
Patient wife stated that pharmacy is not able to ship out Repatha medication until Tuesday July 3 which will delay his treatment process. Patient wife wants to know if it is okay to do the treatment the following Tuesday. Patient takes medication per patient wife every 14 days. But on chart it states 21 days. 1) is 14 days treatment correct and 2) is it okay to continue the following Tuesday?

## 2016-12-04 NOTE — Telephone Encounter (Signed)
Left voice mail to call back 

## 2016-12-07 DIAGNOSIS — F411 Generalized anxiety disorder: Secondary | ICD-10-CM | POA: Diagnosis not present

## 2016-12-07 DIAGNOSIS — F330 Major depressive disorder, recurrent, mild: Secondary | ICD-10-CM | POA: Diagnosis not present

## 2017-01-04 ENCOUNTER — Other Ambulatory Visit: Payer: Self-pay | Admitting: Family Medicine

## 2017-01-05 ENCOUNTER — Other Ambulatory Visit: Payer: PPO

## 2017-01-07 DIAGNOSIS — F411 Generalized anxiety disorder: Secondary | ICD-10-CM | POA: Diagnosis not present

## 2017-01-07 DIAGNOSIS — F330 Major depressive disorder, recurrent, mild: Secondary | ICD-10-CM | POA: Diagnosis not present

## 2017-01-08 ENCOUNTER — Other Ambulatory Visit: Payer: Self-pay | Admitting: Radiology

## 2017-01-08 ENCOUNTER — Other Ambulatory Visit: Payer: PPO

## 2017-01-08 DIAGNOSIS — Z5181 Encounter for therapeutic drug level monitoring: Secondary | ICD-10-CM

## 2017-01-08 DIAGNOSIS — Z7901 Long term (current) use of anticoagulants: Principal | ICD-10-CM

## 2017-01-11 ENCOUNTER — Other Ambulatory Visit (INDEPENDENT_AMBULATORY_CARE_PROVIDER_SITE_OTHER): Payer: PPO

## 2017-01-11 DIAGNOSIS — Z7901 Long term (current) use of anticoagulants: Secondary | ICD-10-CM | POA: Diagnosis not present

## 2017-01-11 DIAGNOSIS — Z5181 Encounter for therapeutic drug level monitoring: Secondary | ICD-10-CM

## 2017-01-12 ENCOUNTER — Ambulatory Visit: Payer: Self-pay | Admitting: Pharmacist

## 2017-01-12 DIAGNOSIS — I6310 Cerebral infarction due to embolism of unspecified precerebral artery: Secondary | ICD-10-CM

## 2017-01-12 LAB — PROTIME-INR
INR: 1
PROTHROMBIN TIME: 10.2 s (ref 9.0–11.5)

## 2017-01-13 ENCOUNTER — Other Ambulatory Visit: Payer: Self-pay | Admitting: Family Medicine

## 2017-01-13 ENCOUNTER — Telehealth: Payer: Self-pay

## 2017-01-13 DIAGNOSIS — I480 Paroxysmal atrial fibrillation: Secondary | ICD-10-CM

## 2017-01-13 NOTE — Telephone Encounter (Signed)
Can you please place lab orders for INR

## 2017-01-19 ENCOUNTER — Other Ambulatory Visit (INDEPENDENT_AMBULATORY_CARE_PROVIDER_SITE_OTHER): Payer: PPO

## 2017-01-19 DIAGNOSIS — I480 Paroxysmal atrial fibrillation: Secondary | ICD-10-CM

## 2017-01-19 LAB — PROTIME-INR
INR: 2.7 ratio — ABNORMAL HIGH (ref 0.8–1.0)
PROTHROMBIN TIME: 29 s — AB (ref 9.6–13.1)

## 2017-02-01 ENCOUNTER — Telehealth: Payer: Self-pay | Admitting: Radiology

## 2017-02-01 DIAGNOSIS — Z5181 Encounter for therapeutic drug level monitoring: Secondary | ICD-10-CM

## 2017-02-01 NOTE — Telephone Encounter (Signed)
Pt of Dr. Cook's coming in for labs tomorrow, please place future orders. Thank you.  

## 2017-02-01 NOTE — Telephone Encounter (Signed)
Order placed. It looks like it's been a year since this patient has been evaluated in the office. Please get him set up for follow-up. Thanks.

## 2017-02-01 NOTE — Addendum Note (Signed)
Addended by: Leone Haven on: 02/01/2017 06:52 PM   Modules accepted: Orders

## 2017-02-02 ENCOUNTER — Other Ambulatory Visit (INDEPENDENT_AMBULATORY_CARE_PROVIDER_SITE_OTHER): Payer: PPO

## 2017-02-02 ENCOUNTER — Other Ambulatory Visit: Payer: Self-pay | Admitting: Family Medicine

## 2017-02-02 DIAGNOSIS — Z5181 Encounter for therapeutic drug level monitoring: Secondary | ICD-10-CM

## 2017-02-02 LAB — PROTIME-INR
INR: 3.6 ratio — AB (ref 0.8–1.0)
PROTHROMBIN TIME: 38.9 s — AB (ref 9.6–13.1)

## 2017-02-02 MED ORDER — WARFARIN SODIUM 7.5 MG PO TABS: ORAL_TABLET | ORAL | 3 refills | Status: DC

## 2017-02-03 ENCOUNTER — Other Ambulatory Visit: Payer: Self-pay | Admitting: Family Medicine

## 2017-02-03 NOTE — Telephone Encounter (Signed)
Appointment scheduled.

## 2017-02-11 DIAGNOSIS — F411 Generalized anxiety disorder: Secondary | ICD-10-CM | POA: Diagnosis not present

## 2017-02-11 DIAGNOSIS — F330 Major depressive disorder, recurrent, mild: Secondary | ICD-10-CM | POA: Diagnosis not present

## 2017-02-16 ENCOUNTER — Other Ambulatory Visit (INDEPENDENT_AMBULATORY_CARE_PROVIDER_SITE_OTHER): Payer: PPO

## 2017-02-16 DIAGNOSIS — Z5181 Encounter for therapeutic drug level monitoring: Secondary | ICD-10-CM | POA: Diagnosis not present

## 2017-02-16 LAB — PROTIME-INR
INR: 2.6 ratio — AB (ref 0.8–1.0)
PROTHROMBIN TIME: 28.1 s — AB (ref 9.6–13.1)

## 2017-02-17 ENCOUNTER — Other Ambulatory Visit: Payer: Self-pay | Admitting: Family Medicine

## 2017-02-17 DIAGNOSIS — Z7901 Long term (current) use of anticoagulants: Secondary | ICD-10-CM

## 2017-02-27 ENCOUNTER — Other Ambulatory Visit: Payer: Self-pay | Admitting: Family Medicine

## 2017-03-03 ENCOUNTER — Telehealth: Payer: Self-pay | Admitting: Family Medicine

## 2017-03-03 NOTE — Telephone Encounter (Signed)
Pt is not sure about how much warfarin (COUMADIN) 7.5 MG tablet he should take. Please call him at [phone].

## 2017-03-03 NOTE — Telephone Encounter (Signed)
Patient states bottle reads   Coumadin 7.5 mg tablets  s take 1.5 tablet Monday and Friday and 1 tab all other days script was  was filled on 03/01/17 . Last INR was 2.6  On 03/05/17.    Patient would like to claify how to take

## 2017-03-03 NOTE — Telephone Encounter (Signed)
Patient should be taking Coumadin half a tablet Wednesday and Saturday and 1 tablet the rest of the days of the week. Thanks.

## 2017-03-03 NOTE — Telephone Encounter (Signed)
Please advise 

## 2017-03-03 NOTE — Telephone Encounter (Signed)
Spoke with patient he states he has been taking Coumadin 1/2 tablet on Wednesday and Saturday and 1 tablet the rest of the week.  Patient will continue current regimen.

## 2017-03-05 ENCOUNTER — Ambulatory Visit (INDEPENDENT_AMBULATORY_CARE_PROVIDER_SITE_OTHER): Payer: PPO | Admitting: Family Medicine

## 2017-03-05 ENCOUNTER — Encounter: Payer: Self-pay | Admitting: Family Medicine

## 2017-03-05 VITALS — BP 106/76 | HR 70 | Temp 97.6°F | Wt 181.2 lb

## 2017-03-05 DIAGNOSIS — F329 Major depressive disorder, single episode, unspecified: Secondary | ICD-10-CM | POA: Diagnosis not present

## 2017-03-05 DIAGNOSIS — I10 Essential (primary) hypertension: Secondary | ICD-10-CM | POA: Diagnosis not present

## 2017-03-05 DIAGNOSIS — N529 Male erectile dysfunction, unspecified: Secondary | ICD-10-CM | POA: Diagnosis not present

## 2017-03-05 DIAGNOSIS — Z7901 Long term (current) use of anticoagulants: Secondary | ICD-10-CM | POA: Diagnosis not present

## 2017-03-05 DIAGNOSIS — Z23 Encounter for immunization: Secondary | ICD-10-CM

## 2017-03-05 DIAGNOSIS — F419 Anxiety disorder, unspecified: Secondary | ICD-10-CM | POA: Diagnosis not present

## 2017-03-05 DIAGNOSIS — Z5181 Encounter for therapeutic drug level monitoring: Secondary | ICD-10-CM

## 2017-03-05 DIAGNOSIS — I480 Paroxysmal atrial fibrillation: Secondary | ICD-10-CM | POA: Diagnosis not present

## 2017-03-05 DIAGNOSIS — F32A Depression, unspecified: Secondary | ICD-10-CM

## 2017-03-05 LAB — COMPREHENSIVE METABOLIC PANEL
ALK PHOS: 70 U/L (ref 39–117)
ALT: 15 U/L (ref 0–53)
AST: 19 U/L (ref 0–37)
Albumin: 4 g/dL (ref 3.5–5.2)
BUN: 20 mg/dL (ref 6–23)
CHLORIDE: 102 meq/L (ref 96–112)
CO2: 31 mEq/L (ref 19–32)
Calcium: 9.6 mg/dL (ref 8.4–10.5)
Creatinine, Ser: 1 mg/dL (ref 0.40–1.50)
GFR: 78.29 mL/min (ref >60.00)
GLUCOSE: 108 mg/dL — AB (ref 70–99)
POTASSIUM: 4.2 meq/L (ref 3.5–5.1)
SODIUM: 138 meq/L (ref 135–145)
TOTAL PROTEIN: 7 g/dL (ref 6.0–8.3)
Total Bilirubin: 0.5 mg/dL (ref 0.2–1.2)

## 2017-03-05 LAB — CBC
HEMATOCRIT: 45.7 % (ref 39.0–52.0)
HEMOGLOBIN: 15.4 g/dL (ref 13.0–17.0)
MCHC: 33.6 g/dL (ref 30.0–36.0)
MCV: 95.5 fl (ref 78.0–100.0)
Platelets: 207 10*3/uL (ref 150.0–400.0)
RBC: 4.79 Mil/uL (ref 4.22–5.81)
RDW: 13.5 % (ref 11.5–15.5)
WBC: 6.4 10*3/uL (ref 4.0–10.5)

## 2017-03-05 LAB — PROTIME-INR
INR: 2.4 ratio — ABNORMAL HIGH (ref 0.8–1.0)
Prothrombin Time: 26.1 s — ABNORMAL HIGH (ref 9.6–13.1)

## 2017-03-05 NOTE — Patient Instructions (Addendum)
Nice to see you. We are going to request records from your prior urologist. We will send a message to your cardiologist as well. Please discontinue the hydrochlorothiazide. Please monitor your blood pressure and lightheadedness. If the lightheadedness does not improve please let us know. If your blood pressure is consistently greater than 140/90 please let us know.

## 2017-03-05 NOTE — Assessment & Plan Note (Signed)
Orthostatic. Borderline low blood pressures at home. Some lightheadedness. We'll have him discontinue HCTZ. He'll monitor his blood pressure and lightheadedness at home. If it is greater than 140/90 he will let us know.

## 2017-03-05 NOTE — Assessment & Plan Note (Signed)
Suspect vascular cause given other vascular issues. 2+ DP pulses bilaterally. His left testicle is smaller than his right testicle so could be related to atrophy. Discussed referring to urology though he would like [place] to request records from his prior urologist first. We'll additionally send a message to his cardiologist regarding use of Viagra given his cardiac history.

## 2017-03-05 NOTE — Assessment & Plan Note (Signed)
Followed by psychiatry. Fairly well controlled.

## 2017-03-05 NOTE — Assessment & Plan Note (Addendum)
Sinus rhythm on exam. Rate controlled. Continue Coumadin. Check INR. Check CBC.Marland Kitchen

## 2017-03-05 NOTE — Progress Notes (Signed)
Tommi Rumps, MD Phone: [phone]  [name] is a 71 y.o. male who presents today for establishing care.  A. fib: Taking warfarin. Rare palpitations. No tachycardia. No bleeding. Does have some bruising.  Hypertension: Typically running around 110/60 at home. Oftentimes into the 27N diastolically. Taking bisoprolol, HCTZ, lisinopril. No chest pain, shortness breath, or edema. He does get lightheaded if he goes from seated or bending over to standing.  Anxiety/depression: Currently on Lexapro and tapering off. Also taking Cymbalta. Following with psychiatry. No depression. Notes periods of anxiety.  Patient notes that since he had a stroke previously he has slept significantly more than prior. He notes prior workup has been unremarkable and attributes this to his prior stroke.  He notes that he has a hard time getting an erection. He can't hardly get any formal interaction now. Has previously seen urology. Does note he has been advised that his left testicle smaller than his right testicle is unsure how much smaller.  PMH: Former smoker   ROS see history of present illness  Objective  Physical Exam Vitals:   03/05/17 1459  BP: 106/76  Pulse: 70  Temp: 97.6 F (36.4 C)  SpO2: 98%   Laying blood pressure 143/95 pulse 67 Sitting blood pressure 134/82 pulse 72 Standing blood pressure 118/76 pulse 76  BP Readings from Last 3 Encounters:  03/05/17 106/76  10/22/16 118/70  08/31/16 120/80   Wt Readings from Last 3 Encounters:  03/05/17 181 lb 3.2 oz (82.2 kg)  10/22/16 181 lb 12.8 oz (82.5 kg)  08/31/16 186 lb 8 oz (84.6 kg)    Physical Exam  Constitutional: No distress.  Cardiovascular: Normal rate, regular rhythm and normal heart sounds.   Pulmonary/Chest: Effort normal and breath sounds normal.  Genitourinary:  Genitourinary Comments: Normal circumcised penis, left testicle quite a bit smaller than the right testicle, no palpable defects in either testicle,  normal epididymis, normal vas deferens, no inguinal hernias  Musculoskeletal: He exhibits no edema.  Neurological: He is alert. Gait normal.  Skin: Skin is warm and dry. He is not diaphoretic.     Assessment/Plan: Please see individual problem list.  A-fib Sinus rhythm on exam. Rate controlled. Continue Coumadin. Check INR. Check CBC..  Essential hypertension Orthostatic. Borderline low blood pressures at home. Some lightheadedness. We'll have him discontinue HCTZ. He'll monitor his blood pressure and lightheadedness at home. If it is greater than 140/90 he will let us know.  Anxiety and depression Followed by psychiatry. Fairly well controlled.  Erectile dysfunction Suspect vascular cause given other vascular issues. 2+ DP pulses bilaterally. His left testicle is smaller than his right testicle so could be related to atrophy. Discussed referring to urology though he would like [place] to request records from his prior urologist first. We'll additionally send a message to his cardiologist regarding use of Viagra given his cardiac history.  Chronic sleepiness. No apnea. Patient feels this is likely related to her stroke symptoms started after this. I concur with this. He'll continue to monitor. If worsens or he develops apneic episodes a let us know.  Orders Placed This Encounter  Procedures  . Comp Met (CMET)    Standing Status:   Future    Number of Occurrences:   1    Standing Expiration Date:   03/05/2018  . CBC    Standing Status:   Future    Number of Occurrences:   1    Standing Expiration Date:   03/05/2018  . INR/PT  Tommi Rumps, MD [place]

## 2017-03-08 DIAGNOSIS — D2271 Melanocytic nevi of right lower limb, including hip: Secondary | ICD-10-CM | POA: Diagnosis not present

## 2017-03-08 DIAGNOSIS — L570 Actinic keratosis: Secondary | ICD-10-CM | POA: Diagnosis not present

## 2017-03-08 DIAGNOSIS — D034 Melanoma in situ of scalp and neck: Secondary | ICD-10-CM | POA: Diagnosis not present

## 2017-03-08 DIAGNOSIS — D485 Neoplasm of uncertain behavior of skin: Secondary | ICD-10-CM | POA: Diagnosis not present

## 2017-03-08 DIAGNOSIS — Z85828 Personal history of other malignant neoplasm of skin: Secondary | ICD-10-CM | POA: Diagnosis not present

## 2017-03-08 DIAGNOSIS — D225 Melanocytic nevi of trunk: Secondary | ICD-10-CM | POA: Diagnosis not present

## 2017-03-08 DIAGNOSIS — C44612 Basal cell carcinoma of skin of right upper limb, including shoulder: Secondary | ICD-10-CM | POA: Diagnosis not present

## 2017-03-08 DIAGNOSIS — D2272 Melanocytic nevi of left lower limb, including hip: Secondary | ICD-10-CM | POA: Diagnosis not present

## 2017-03-08 DIAGNOSIS — X32XXXA Exposure to sunlight, initial encounter: Secondary | ICD-10-CM | POA: Diagnosis not present

## 2017-03-11 ENCOUNTER — Telehealth: Payer: Self-pay | Admitting: Family Medicine

## 2017-03-11 NOTE — Telephone Encounter (Signed)
Please contact the patient and let him know that we heard back from his cardiologist who said we needed to work on his blood pressure and then could consider Viagra from their standpoint. Please see if he can come in for recheck of his blood pressure since we discontinued his hydrochlorothiazide. We'll need to recheck BP before considering Viagra. Thanks.

## 2017-03-11 NOTE — Telephone Encounter (Signed)
-----   Message from Minna Merritts, MD sent at 03/09/2017 10:35 PM EDT ----- BP looks a little  Low Maybe weight loss  We could cut some BP meds, maybe decrease lisinopril to once a day Then viagra should be fine. For orthostasis, we could cut more pill thx TG  ----- Message ----- From: Leone Haven, MD Sent: 03/05/2017   3:43 PM To: Minna Merritts, MD  Hi Dr Rockey Situ,   I saw Mr Whitham as a new patient today and he noted some erectile dysfunction issues. I am requesting records from his prior urologist prior to considering viagra as a treatment though wanted to check with you to see if you felt this would be okay for the patient based on his cardiac history. Thanks for your help.   Randall Hiss

## 2017-03-12 NOTE — Telephone Encounter (Signed)
Patient notified and scheduled 

## 2017-03-16 ENCOUNTER — Encounter: Payer: Self-pay | Admitting: Family Medicine

## 2017-03-16 ENCOUNTER — Ambulatory Visit (INDEPENDENT_AMBULATORY_CARE_PROVIDER_SITE_OTHER): Payer: PPO | Admitting: Family Medicine

## 2017-03-16 VITALS — BP 160/97 | HR 73 | Temp 97.5°F | Resp 16 | Wt 183.0 lb

## 2017-03-16 DIAGNOSIS — I10 Essential (primary) hypertension: Secondary | ICD-10-CM

## 2017-03-16 DIAGNOSIS — R0609 Other forms of dyspnea: Secondary | ICD-10-CM

## 2017-03-16 DIAGNOSIS — R0600 Dyspnea, unspecified: Secondary | ICD-10-CM | POA: Insufficient documentation

## 2017-03-16 MED ORDER — HYDROCHLOROTHIAZIDE 12.5 MG PO TABS: 12.5000 mg | ORAL_TABLET | Freq: Every day | ORAL | 3 refills | Status: DC

## 2017-03-16 NOTE — Patient Instructions (Signed)
Nice to see. We are going to restart you on HCTZ. We'll get her back in to see Dr. Timmothy Sours for follow-up. If you develop persistent chest pain or shortness of breath please seek medical attention immediately.

## 2017-03-16 NOTE — Assessment & Plan Note (Signed)
Patient reports dyspnea on exertion. This is been going on for a number of weeks. He failed to mention this at his last visit. EKG is reassuring. Recent CBC reassuring. Given history of CAD we'll have him get back in to see his cardiologist for follow-up.

## 2017-03-16 NOTE — Assessment & Plan Note (Signed)
Has become more hypertensive since discontinuing his hydrochlorothiazide. He is on max dose of lisinopril. Discussed adding back hydrochlorothiazide and seeing how he does with this. Follow-up in one month. Recheck lab work at that time.

## 2017-03-16 NOTE — Progress Notes (Signed)
  Tommi Rumps, MD Phone: [phone]  [name] is a 71 y.o. male who presents today for follow-up.  Patient presents for follow-up of his blood pressure. Seen late last month and noted to have some lightheadedness with low normal blood pressures. We discontinued his hydrochlorothiazide. Since then his blood pressure has been running in the 150s over 90s. Lightheadedness has improved. He notes no chest pain. He notes for a number of weeks he has had some dyspnea on exertion if he walks with any increased pace. He states he forgot to mention this last time. No diaphoresis. No cough. No wheezing. Has felt a little more jittery in his chest.  PMH: Former smoker   ROS see history of present illness  Objective  Physical Exam Vitals:   03/16/17 1315  BP: (!) 160/97  Pulse: 73  Resp: 16  Temp: (!) 97.5 F (36.4 C)  SpO2: 96%    BP Readings from Last 3 Encounters:  03/16/17 (!) 160/97  03/05/17 106/76  10/22/16 118/70   Wt Readings from Last 3 Encounters:  03/16/17 183 lb (83 kg)  03/05/17 181 lb 3.2 oz (82.2 kg)  10/22/16 181 lb 12.8 oz (82.5 kg)    Physical Exam  Constitutional: No distress.  Cardiovascular: Normal rate, regular rhythm and normal heart sounds.   Pulmonary/Chest: Effort normal and breath sounds normal.  Musculoskeletal: He exhibits no edema.  Neurological: He is alert.  Skin: He is not diaphoretic.   EKG: Normal sinus rhythm, rate 72, no ischemic changes  Assessment/Plan: Please see individual problem list.  Essential hypertension Has become more hypertensive since discontinuing his hydrochlorothiazide. He is on max dose of lisinopril. Discussed adding back hydrochlorothiazide and seeing how he does with this. Follow-up in one month. Recheck lab work at that time.  Dyspnea on exertion Patient reports dyspnea on exertion. This is been going on for a number of weeks. He failed to mention this at his last visit. EKG is reassuring. Recent CBC  reassuring. Given history of CAD we'll have him get back in to see his cardiologist for follow-up.   Orders Placed This Encounter  Procedures  . EKG 12-Lead    Meds ordered this encounter  Medications  . hydrochlorothiazide (HYDRODIURIL) 12.5 MG tablet    Sig: Take 1 tablet (12.5 mg total) by mouth daily.    Dispense:  90 tablet    Refill:  [place], MD [place]

## 2017-03-18 ENCOUNTER — Other Ambulatory Visit: Payer: PPO

## 2017-03-23 DIAGNOSIS — L905 Scar conditions and fibrosis of skin: Secondary | ICD-10-CM | POA: Diagnosis not present

## 2017-03-23 DIAGNOSIS — D034 Melanoma in situ of scalp and neck: Secondary | ICD-10-CM | POA: Diagnosis not present

## 2017-04-01 ENCOUNTER — Telehealth: Payer: Self-pay | Admitting: *Deleted

## 2017-04-01 DIAGNOSIS — Z7901 Long term (current) use of anticoagulants: Secondary | ICD-10-CM

## 2017-04-01 NOTE — Telephone Encounter (Signed)
Standing order placed for PT/INR 

## 2017-04-03 ENCOUNTER — Telehealth: Payer: Self-pay | Admitting: Family Medicine

## 2017-04-03 DIAGNOSIS — N500 Atrophy of testis: Secondary | ICD-10-CM

## 2017-04-03 NOTE — Telephone Encounter (Signed)
Please let the patient know that we received his records from his prior urologist. There is no mention of abnormalities of his testicles and thus based on the abnormality I found previously I would suggest seeing urology again. Thanks.

## 2017-04-05 ENCOUNTER — Other Ambulatory Visit (INDEPENDENT_AMBULATORY_CARE_PROVIDER_SITE_OTHER): Payer: PPO

## 2017-04-05 ENCOUNTER — Other Ambulatory Visit: Payer: Self-pay | Admitting: Family Medicine

## 2017-04-05 DIAGNOSIS — Z5181 Encounter for therapeutic drug level monitoring: Secondary | ICD-10-CM

## 2017-04-05 DIAGNOSIS — Z7901 Long term (current) use of anticoagulants: Secondary | ICD-10-CM | POA: Diagnosis not present

## 2017-04-05 LAB — PROTIME-INR
INR: 3.3 ratio — ABNORMAL HIGH (ref 0.8–1.0)
Prothrombin Time: 35.5 s — ABNORMAL HIGH (ref 9.6–13.1)

## 2017-04-05 NOTE — Addendum Note (Signed)
Addended by: Leone Haven on: 04/05/2017 08:28 PM   Modules accepted: Orders

## 2017-04-05 NOTE — Telephone Encounter (Signed)
Referral placed.

## 2017-04-05 NOTE — Telephone Encounter (Signed)
Patient notified and would like referral

## 2017-04-13 DIAGNOSIS — F411 Generalized anxiety disorder: Secondary | ICD-10-CM | POA: Diagnosis not present

## 2017-04-13 DIAGNOSIS — F330 Major depressive disorder, recurrent, mild: Secondary | ICD-10-CM | POA: Diagnosis not present

## 2017-04-16 ENCOUNTER — Encounter: Payer: Self-pay | Admitting: Family Medicine

## 2017-04-16 ENCOUNTER — Ambulatory Visit: Payer: PPO | Admitting: Family Medicine

## 2017-04-16 VITALS — BP 132/64 | HR 71 | Temp 97.5°F | Wt 183.6 lb

## 2017-04-16 DIAGNOSIS — K219 Gastro-esophageal reflux disease without esophagitis: Secondary | ICD-10-CM | POA: Diagnosis not present

## 2017-04-16 DIAGNOSIS — F419 Anxiety disorder, unspecified: Secondary | ICD-10-CM

## 2017-04-16 DIAGNOSIS — I10 Essential (primary) hypertension: Secondary | ICD-10-CM

## 2017-04-16 DIAGNOSIS — I480 Paroxysmal atrial fibrillation: Secondary | ICD-10-CM

## 2017-04-16 DIAGNOSIS — I2510 Atherosclerotic heart disease of native coronary artery without angina pectoris: Secondary | ICD-10-CM | POA: Diagnosis not present

## 2017-04-16 DIAGNOSIS — Z5181 Encounter for therapeutic drug level monitoring: Secondary | ICD-10-CM

## 2017-04-16 DIAGNOSIS — F329 Major depressive disorder, single episode, unspecified: Secondary | ICD-10-CM | POA: Diagnosis not present

## 2017-04-16 LAB — PROTIME-INR
INR: 2.1 ratio — AB (ref 0.8–1.0)
PROTHROMBIN TIME: 22.9 s — AB (ref 9.6–13.1)

## 2017-04-16 MED ORDER — NITROGLYCERIN 0.4 MG SL SUBL: 0.4000 mg | SUBLINGUAL_TABLET | SUBLINGUAL | 3 refills | Status: DC | PRN

## 2017-04-16 NOTE — Assessment & Plan Note (Signed)
Check INR.  Sinus rhythm.

## 2017-04-16 NOTE — Assessment & Plan Note (Signed)
Anxiety was slightly worse.  Improved a little bit after backing down on Cymbalta.  I encouraged them to discuss medication alteration with his psychiatrist.

## 2017-04-16 NOTE — Assessment & Plan Note (Signed)
Patient with history of CAD.  Had an episode of chest tightness about a week ago.  No recurrence.  Intermittently gets short of breath.  Has not had to use nitroglycerin.  Refill of nitroglycerin sent to pharmacy.  He has a cardiology visit next week.  Discussed keeping this.  Given return precautions.

## 2017-04-16 NOTE — Patient Instructions (Signed)
Nice to see you. Please try taking the Zantac 30 minutes prior to breakfast and dinner.  If that is not beneficial please let us know. Please keep the appointment with cardiology next week. If you develop chest pain or trouble breathing please be evaluated.

## 2017-04-16 NOTE — Assessment & Plan Note (Signed)
Improved control.  Occasional lightheadedness.  He will rise slowly.

## 2017-04-16 NOTE — Progress Notes (Signed)
Tommi Rumps, MD Phone: [phone]  [name] is a 71 y.o. male who presents today for follow-up.  Hypertension: Better controlled.  Taking HCTZ, Zebeta, lisinopril.  Notes occasional lightheadedness when he stands up.  No syncope.  Does note dyspnea is intermittent.  Has continued.  About a week ago he did have an episode of chest tightness after carrying a vacuum up the stairs.  He laid down and this went away.    Does report some reflux symptoms with sour taste in his mouth and burning mostly at night.  He is taking Zantac.  Though not necessarily taking this 30 minutes prior to meals.  Notes anxiety is somewhat worse.  He has been on Lexapro though his psychiatrist added Cymbalta and the patient and his wife both note this made his anxiety significantly worse.  He is now on 40 mg and that has improved.  He has been depressed most of his adult life.  No SI.  Due for INR check.  Currently on Coumadin 7.5 mg Tuesday, Thursday, Saturday, and Sunday.  3.25 mg Monday Wednesday and Friday.  Social History   Tobacco Use  Smoking Status Former Smoker  Smokeless Tobacco Never Used     ROS see history of present illness  Objective  Physical Exam Vitals:   04/16/17 1316  BP: 132/64  Pulse: 71  Temp: (!) 97.5 F (36.4 C)  SpO2: 98%    BP Readings from Last 3 Encounters:  04/16/17 132/64  03/16/17 (!) 160/97  03/05/17 106/76   Wt Readings from Last 3 Encounters:  04/16/17 183 lb 9.6 oz (83.3 kg)  03/16/17 183 lb (83 kg)  03/05/17 181 lb 3.2 oz (82.2 kg)    Physical Exam  Constitutional: No distress.  Cardiovascular: Normal rate, regular rhythm and normal heart sounds.  Pulmonary/Chest: Effort normal and breath sounds normal.  Musculoskeletal: He exhibits no edema.  Neurological: He is alert. Gait normal.  Skin: Skin is warm and dry. He is not diaphoretic.     Assessment/Plan: Please see individual problem list.  A-fib Check INR.  Sinus rhythm.  CAD  (coronary artery disease) Patient with history of CAD.  Had an episode of chest tightness about a week ago.  No recurrence.  Intermittently gets short of breath.  Has not had to use nitroglycerin.  Refill of nitroglycerin sent to pharmacy.  He has a cardiology visit next week.  Discussed keeping this.  Given return precautions.  Essential hypertension Improved control.  Occasional lightheadedness.  He will rise slowly.  GERD (gastroesophageal reflux disease) Intermittently has issues with this.  Discussed taking the Zantac appropriately 30-60 minutes prior to eating breakfast and dinner.  Discussed elevating head of bed.  This patient and wife do not want to try a PPI.  Anxiety and depression Anxiety was slightly worse.  Improved a little bit after backing down on Cymbalta.  I encouraged them to discuss medication alteration with his psychiatrist.   [name] was seen today for follow-up.  Diagnoses and all orders for this visit:  Medication monitoring encounter -     INR/PT  Paroxysmal atrial fibrillation (HCC)  Coronary artery disease involving native coronary artery of native heart without angina pectoris  Essential hypertension  Gastroesophageal reflux disease, esophagitis presence not specified  Anxiety and depression  Other orders -     nitroGLYCERIN (NITROSTAT) 0.4 MG SL tablet; Place 1 tablet (0.4 mg total) every 5 (five) minutes as needed under the tongue for chest pain.    Orders Placed  This Encounter  Procedures  . INR/PT    Meds ordered this encounter  Medications  . nitroGLYCERIN (NITROSTAT) 0.4 MG SL tablet    Sig: Place 1 tablet (0.4 mg total) every 5 (five) minutes as needed under the tongue for chest pain.    Dispense:  25 tablet    Refill:  [place], MD [place]

## 2017-04-16 NOTE — Assessment & Plan Note (Signed)
Intermittently has issues with this.  Discussed taking the Zantac appropriately 30-60 minutes prior to eating breakfast and dinner.  Discussed elevating head of bed.  This patient and wife do not want to try a PPI.

## 2017-04-19 ENCOUNTER — Other Ambulatory Visit: Payer: PPO

## 2017-04-20 ENCOUNTER — Ambulatory Visit: Payer: PPO | Admitting: Cardiovascular Disease

## 2017-04-20 ENCOUNTER — Encounter: Payer: Self-pay | Admitting: Cardiovascular Disease

## 2017-04-20 VITALS — BP 108/60 | HR 68 | Ht 73.0 in | Wt 183.5 lb

## 2017-04-20 DIAGNOSIS — I6310 Cerebral infarction due to embolism of unspecified precerebral artery: Secondary | ICD-10-CM | POA: Diagnosis not present

## 2017-04-20 DIAGNOSIS — R0609 Other forms of dyspnea: Secondary | ICD-10-CM

## 2017-04-20 DIAGNOSIS — I10 Essential (primary) hypertension: Secondary | ICD-10-CM

## 2017-04-20 DIAGNOSIS — I25118 Atherosclerotic heart disease of native coronary artery with other forms of angina pectoris: Secondary | ICD-10-CM

## 2017-04-20 DIAGNOSIS — E782 Mixed hyperlipidemia: Secondary | ICD-10-CM

## 2017-04-20 DIAGNOSIS — I480 Paroxysmal atrial fibrillation: Secondary | ICD-10-CM | POA: Diagnosis not present

## 2017-04-20 DIAGNOSIS — R079 Chest pain, unspecified: Secondary | ICD-10-CM

## 2017-04-20 NOTE — Progress Notes (Signed)
Cardiology Office Note  Date:  04/20/2017   ID:  [name], DOB 1945/10/07, MRN [id number]  PCP:  Leone Haven, MD   Chief Complaint  Patient presents with  . other    Sob. Meds reviewed verbally with pt.    HPI:  71 y/o male with a history of  PAF,  prior stroke treated with TPA with mild residual left sided weakness,  Depression with chronic fatigue,  chronic anticoagulation therapy with Coumadin and  h/o CAD s/p PCI to the LCx in 2007.  mini stroke in 2015 seen on PET scan, did not have symptoms and was on Coumadin at the time. Heavy ETOH, wine per night 3 to 5+ per night Presents for f/u of his CAD, new chest pain  In follow-up today he reports having recent episode of chest tightness, stomach upset  lasting 30 minutes  Reports that prior to this he was cleaning floors, doing ADLs at home and took a shower then had to lay down in bed secondary to exhaustion and chest pain Wife is concerned about cardiac etiology  Long history  of chronic fatigue poor sleep presents with his wife who reports he continues to have symptoms  Walks 15 min per day, walks the dogs No regular exercise Sleeps in 11 AM  Able to sleep long periods of time then wakes up still tired  long-standing issue dating back many years Sx worse since CVA  TSH and testosterone normal last year Labs were reviewed with him in detail  EKG on today's visit shows normal sinus rhythm with rate 68 bpm, no significant ST or T-wave changes  Other past medical history reviewed Previous retinal tear, needed laser treatment Concerned it was brought on by the Coumadin, ophthalmology did not think so per the patient  Previous stress test again reviewed with him showing no ischemia Holter monitor reviewed with him showing APCs, PVCs, very short runs of atrial tachycardia up to 6 beats  He reports that he does appreciate these palpitations at times, sometimes certain days worse than others  He does  report having myalgias on Crestor and higher dose Lipitor Previous total cholesterol 174, LDL 93  PMH:   has a past medical history of A-fib ([place]), BPH (benign prostatic hyperplasia), Coronary artery disease, Depression, GERD (gastroesophageal reflux disease), Hyperlipidemia, Hypertension, IBS (irritable bowel syndrome), IBS (irritable bowel syndrome), PNA (pneumonia), Skin cancer, and Stroke ([place]).  PSH:    Past Surgical History:  Procedure Laterality Date  . ADENOIDECTOMY  1951  . CARDIAC CATHETERIZATION  2006   X1 STENT  . SKIN CANCER EXCISION N/A 08/2016   Forehead.  [facility] (Dr. Deliah Boston)  . THYROIDECTOMY, PARTIAL      Current Outpatient Medications  Medication Sig Dispense Refill  . aspirin 81 MG tablet Take 81 mg by mouth daily.    . bisoprolol (ZEBETA) 5 MG tablet TAKE 1 TABLET (5 MG TOTAL) BY MOUTH DAILY. 90 tablet 3  . Cholecalciferol (VITAMIN D) 2000 UNITS CAPS Take 2,000 Units by mouth 2 (two) times daily.     Marland Kitchen dicyclomine (BENTYL) 10 MG capsule TAKE 1 CAPSULE (10 MG TOTAL) BY MOUTH 2 (TWO) TIMES DAILY. 90 capsule 3  . DULoxetine (CYMBALTA) 20 MG capsule Take 40 mg by mouth daily.    Marland Kitchen escitalopram (LEXAPRO) 20 MG tablet TAKE 1 TABLET (20 MG TOTAL) BY MOUTH DAILY. 180 tablet 1  . Evolocumab (REPATHA [place]) Inject 1 application into the skin every 21 ( twenty-one) days.    Marland Kitchen  hydrochlorothiazide (HYDRODIURIL) 12.5 MG tablet Take 1 tablet (12.5 mg total) by mouth daily. 90 tablet 3  . lisinopril (PRINIVIL,ZESTRIL) 20 MG tablet TAKE 1 TABLET BY MOUTH TWICE A DAY 180 tablet 3  . nitroGLYCERIN (NITROSTAT) 0.4 MG SL tablet Place 1 tablet (0.4 mg total) every 5 (five) minutes as needed under the tongue for chest pain. 25 tablet 3  . ranitidine (ZANTAC) 150 MG tablet TAKE 1 TABLET (150 MG TOTAL) BY MOUTH 2 (TWO) TIMES DAILY. 180 tablet 4  . vitamin B-12 (CYANOCOBALAMIN) 1000 MCG tablet Take 1,000 mcg by mouth daily.    Marland Kitchen warfarin (COUMADIN) 7.5 MG tablet Take half a  tablet by mouth on Wednesday and Saturday. Remainder of week 1 tablet daily. 90 tablet 3   No current facility-administered medications for this visit.      Allergies:   Bactrim [sulfamethoxazole-trimethoprim] and Crestor [rosuvastatin calcium]   Social History:  The patient  reports that he has quit smoking. he has never used smokeless tobacco. He reports that he drinks about 1.2 - 1.8 oz of alcohol per week. He reports that he does not use drugs.   Family History:   family history includes Arthritis in his mother; Cancer in his maternal aunt and mother; Heart Problems in his father; Heart disease in his father, paternal grandfather, and paternal grandmother; Hypertension in his father; Mental illness in his mother; Stroke in his maternal grandmother and paternal uncle; Sudden death in his maternal grandfather.    Review of Systems: Review of Systems  Constitutional: Positive for malaise/fatigue.  Respiratory: Negative.   Cardiovascular: Negative.   Gastrointestinal: Negative.   Musculoskeletal: Negative.   Neurological: Positive for weakness.  Psychiatric/Behavioral: Negative.   All other systems reviewed and are negative.    PHYSICAL EXAM: VS:  BP 108/60 (BP Location: Left Arm, Patient Position: Sitting, Cuff Size: Normal)   Pulse 68   Ht 6\' 1"  (1.854 m)   Wt 183 lb 8 oz (83.2 kg)   BMI 24.21 kg/m  , BMI Body mass index is 24.21 kg/m. GEN: Well nourished, well developed, in no acute distress, Ecchymosis under his left eye  HEENT: normal  Neck: no JVD, carotid bruits, or masses Cardiac: RRR; no murmurs, rubs, or gallops,no edema  Respiratory:  clear to auscultation bilaterally, normal work of breathing GI: soft, nontender, nondistended, + BS MS: no deformity or atrophy  Skin: warm and dry, no rash Neuro:  Strength and sensation are intact Psych: euthymic mood, full affect    Recent Labs: 08/31/2016: TSH 2.080 03/05/2017: ALT 15; BUN 20; Creatinine, Ser 1.00;  Hemoglobin 15.4; Platelets 207.0; Potassium 4.2; Sodium 138    Lipid Panel Lab Results  Component Value Date   CHOL 146 08/28/2016   HDL 58.30 08/28/2016   LDLCALC 70 08/28/2016   TRIG 91.0 08/28/2016      Wt Readings from Last 3 Encounters:  04/20/17 183 lb 8 oz (83.2 kg)  04/16/17 183 lb 9.6 oz (83.3 kg)  03/16/17 183 lb (83 kg)       ASSESSMENT AND PLAN:  Paroxysmal atrial fibrillation ([place]) - Plan: EKG 12-Lead On anticoagulation Maintaining normal sinus rhythm, no changes to his medications  Coronary artery disease involving native coronary artery of native heart without angina pectoris - Plan: EKG 12-Lead Recent episode of chest pain concerning for angina Known coronary artery disease, prior stent 10 years ago Long discussion with him concerning various treatment options, unable to treadmill We have recommended pharmacologic Myoview to rule out ischemia  Mixed hyperlipidemia - Plan: EKG 12-Lead Cholesterol adequate on Repatha Recommended he continue current medications  Essential hypertension - Plan: EKG 12-Lead Blood pressure is well controlled on today's visit. No changes made to the medications.  Cerebrovascular accident (CVA) due to embolism of precerebral artery ([place]) - Plan: EKG 12-Lead Maintain on anticoagulation  Encounter for anticoagulation discussion and counseling - Plan: EKG 12-Lead Recent fall, trauma to his left eye with ecchymosis Recommended he stopped drinking  Alcohol abuse  Alcohol cessation recommended, likely contributing to depression, poor sleep hygiene  Depression Seen by psychology On Cymbalta Still having alcohol issues, depression   Long discussion today concerning his depression symptoms  Total encounter time more than 45 minutes  Greater than 50% was spent in counseling and coordination of care with the patient   Disposition:   F/U  12 months   Orders Placed This Encounter  Procedures  . NM Myocar Multi W/Spect  W/Wall Motion / EF  . EKG 12-Lead     Signed, Esmond Plants, M.D., Ph.D. 04/20/2017  [place], [place]

## 2017-04-20 NOTE — Patient Instructions (Addendum)
Medication Instructions:   No medication changes made  Labwork:  No new labs needed  Testing/Procedures:  We will order a lexiscan myoview doe CAD and chest pain/angina [facility] MYOVIEW  Your caregiver has ordered a Stress Test with nuclear imaging. The purpose of this test is to evaluate the blood supply to your heart muscle. This procedure is referred to as a "Non-Invasive Stress Test." This is because other than having an IV started in your vein, nothing is inserted or "invades" your body. Cardiac stress tests are done to find areas of poor blood flow to the heart by determining the extent of coronary artery disease (CAD). Some patients exercise on a treadmill, which naturally increases the blood flow to your heart, while others who are  unable to walk on a treadmill due to physical limitations have a pharmacologic/chemical stress agent called Lexiscan . This medicine will mimic walking on a treadmill by temporarily increasing your coronary blood flow.   Please note: these test may take anywhere between 2-4 hours to complete  PLEASE REPORT TO [facility] AT THE FIRST DESK WILL DIRECT YOU WHERE TO GO  Date of Procedure:____Thursday November 29th________  Arrival Time for Procedure:___Arrive at 07:45AM_____  Instructions regarding medication:   __X__ : Hold diabetes medication morning of procedure  __X__:  Hold Zebeta (Bisoprolol) the night before procedure and morning of procedure    PLEASE NOTIFY THE OFFICE AT LEAST 24 HOURS IN ADVANCE IF YOU ARE UNABLE TO KEEP YOUR APPOINTMENT.  [phone] AND  PLEASE NOTIFY NUCLEAR MEDICINE AT [facility] AT LEAST 24 HOURS IN ADVANCE IF YOU ARE UNABLE TO KEEP YOUR APPOINTMENT. [phone]  How to prepare for your Myoview test:  1. Do not eat or drink after midnight 2. No caffeine for 24 hours prior to test 3. No smoking 24 hours prior to test. 4. Your medication may be taken with water.  If your doctor stopped a  medication because of this test, do not take that medication. 5. Ladies, please do not wear dresses.  Skirts or pants are appropriate. Please wear a short sleeve shirt. 6. No perfume, cologne or lotion. 7. Wear comfortable walking shoes. No heels!    Follow-Up: It was a pleasure seeing you in the office today. Please call us if you have new issues that need to be addressed before your next appt.  [phone]  Your physician wants you to follow-up in: 12 months.  You will receive a reminder letter in the mail two months in advance. If you don't receive a letter, please call our office to schedule the follow-up appointment.  If you need a refill on your cardiac medications before your next appointment, please call your pharmacy.

## 2017-04-22 ENCOUNTER — Ambulatory Visit: Payer: PPO | Admitting: Urology

## 2017-04-22 ENCOUNTER — Encounter: Payer: Self-pay | Admitting: Urology

## 2017-04-22 VITALS — BP 121/74 | HR 69 | Ht 73.0 in | Wt 183.0 lb

## 2017-04-22 DIAGNOSIS — N500 Atrophy of testis: Secondary | ICD-10-CM

## 2017-04-22 DIAGNOSIS — N529 Male erectile dysfunction, unspecified: Secondary | ICD-10-CM | POA: Diagnosis not present

## 2017-04-22 NOTE — Progress Notes (Signed)
04/22/2017 2:16 PM   [name] [birth year] [id number]  Referring provider: Leone Haven, MD [address] [place], [place] [address]  Chief Complaint  Patient presents with  . Testicular atrophy    New Patient    HPI: [name] is a 71 year old male who was referred by Dr. Caryl Bis for testicular atrophy.  He has a history of erectile dysfunction and when seeing his primary provider was noted to have decreased size of his left testis.  There is no prior history of epididymitis, orchitis or chronic scrotal pain/swelling.  He did have a testosterone level checked in March 2018 which was normal at 497 ng/dL.  Dr. Caryl Bis had discussed PDE 5 inhibitors and was going to check with his cardiologist to see if he would be cleared to use.  He has sublingual nitroglycerin and his medication list however states he has never taken.  He denies having partial erections.  Organic risk factors include greater than 35-pack-year smoking history.  He quit 13 years ago.  Organic risk factors include coronary artery disease, cerebrovascular disease, hypertension, hyperlipidemia, depression.  He has moderate lower urinary tract symptoms including urinary hesitancy, intermittency and nocturia x3-4 however states the symptoms are not bothersome.  He denies dysuria or gross hematuria.  Denies flank, abdominal, pelvic or scrotal pain.   PMH: Past Medical History:  Diagnosis Date  . A-fib ([place])   . BPH (benign prostatic hyperplasia)   . Coronary artery disease    Hx of MI; S/P PCI  . Depression   . GERD (gastroesophageal reflux disease)   . Hyperlipidemia   . Hypertension   . IBS (irritable bowel syndrome)   . IBS (irritable bowel syndrome)   . PNA (pneumonia)   . Skin cancer   . Stroke [facility])     Surgical History: Past Surgical History:  Procedure Laterality Date  . ADENOIDECTOMY  1951  . CARDIAC CATHETERIZATION  2006   X1 STENT  . SKIN CANCER EXCISION N/A 08/2016     [place].  [facility] (Dr. Deliah Boston)  . THYROIDECTOMY, PARTIAL      Home Medications:  Allergies as of 04/22/2017      Reactions   Bactrim [sulfamethoxazole-trimethoprim] Anaphylaxis   Ulcers   Crestor [rosuvastatin Calcium]    Severe Myalgias      Medication List        Accurate as of 04/22/17  2:16 PM. Always use your most recent med list.          aspirin 81 MG tablet Take 81 mg by mouth daily.   bisoprolol 5 MG tablet Commonly known as:  ZEBETA TAKE 1 TABLET (5 MG TOTAL) BY MOUTH DAILY.   dicyclomine 10 MG capsule Commonly known as:  BENTYL TAKE 1 CAPSULE (10 MG TOTAL) BY MOUTH 2 (TWO) TIMES DAILY.   DULoxetine 20 MG capsule Commonly known as:  CYMBALTA Take 40 mg by mouth daily.   escitalopram 20 MG tablet Commonly known as:  LEXAPRO TAKE 1 TABLET (20 MG TOTAL) BY MOUTH DAILY.   hydrochlorothiazide 12.5 MG tablet Commonly known as:  HYDRODIURIL Take 1 tablet (12.5 mg total) by mouth daily.   lisinopril 20 MG tablet Commonly known as:  PRINIVIL,ZESTRIL TAKE 1 TABLET BY MOUTH TWICE A DAY   nitroGLYCERIN 0.4 MG SL tablet Commonly known as:  NITROSTAT Place 1 tablet (0.4 mg total) every 5 (five) minutes as needed under the tongue for chest pain.   ranitidine 150 MG tablet Commonly known as:  ZANTAC TAKE 1 TABLET (150 MG TOTAL) BY MOUTH 2 (TWO) TIMES DAILY.   REPATHA [place] Inject 1 application into the skin every 21 ( twenty-one) days.   vitamin B-12 1000 MCG tablet Commonly known as:  CYANOCOBALAMIN Take 1,000 mcg by mouth daily.   Vitamin D 2000 units Caps Take 2,000 Units by mouth 2 (two) times daily.   warfarin 7.5 MG tablet Commonly known as:  COUMADIN Take as directed by the anticoagulation clinic. If you are unsure how to take this medication, talk to your nurse or doctor. Original instructions:  Take half a tablet by mouth on Wednesday and Saturday. Remainder of week 1 tablet daily.       Allergies:  Allergies   Allergen Reactions  . Bactrim [Sulfamethoxazole-Trimethoprim] Anaphylaxis    Ulcers  . Crestor [Rosuvastatin Calcium]     Severe Myalgias    Family History: Family History  Problem Relation Age of Onset  . Heart Problems Father   . Heart disease Father   . Hypertension Father   . Mental illness Mother   . Arthritis Mother   . Cancer Mother        ovarian  . Stroke Paternal Uncle   . Heart disease Paternal Grandfather   . Cancer Maternal Aunt   . Stroke Maternal Grandmother   . Sudden death Maternal Grandfather   . Heart disease Paternal Grandmother     Social History:  reports that he has quit smoking. he has never used smokeless tobacco. He reports that he drinks about 1.2 - 1.8 oz of alcohol per week. He reports that he does not use drugs.  ROS: UROLOGY Frequent Urination?: Yes Hard to postpone urination?: Yes Burning/pain with urination?: No Get up at night to urinate?: Yes Leakage of urine?: No Urine stream starts and stops?: No Trouble starting stream?: Yes Do you have to strain to urinate?: No Blood in urine?: No Urinary tract infection?: No Sexually transmitted disease?: No Injury to kidneys or bladder?: No Painful intercourse?: No Weak stream?: No Erection problems?: Yes Penile pain?: No  Gastrointestinal Nausea?: No Vomiting?: No Indigestion/heartburn?: Yes Diarrhea?: No Constipation?: Yes  Constitutional Fever: No Night sweats?: No Weight loss?: No Fatigue?: Yes  Skin Skin rash/lesions?: No Itching?: No  Eyes Blurred vision?: No Double vision?: No  Ears/Nose/Throat Sore throat?: No Sinus problems?: Yes  Hematologic/Lymphatic Swollen glands?: No Easy bruising?: No  Cardiovascular Leg swelling?: No Chest pain?: No  Respiratory Cough?: No Shortness of breath?: No  Endocrine Excessive thirst?: No  Musculoskeletal Back pain?: Yes Joint pain?: Yes  Neurological Headaches?: No Dizziness?: No  Psychologic Depression?:  Yes Anxiety?: Yes  Physical Exam: BP 121/74   Pulse 69   Ht 6\' 1"  (1.854 m)   Wt 183 lb (83 kg)   BMI 24.14 kg/m   Constitutional:  Alert and oriented, No acute distress. HEENT: [place] AT, moist mucus membranes.  Trachea midline, no masses. Cardiovascular: No clubbing, cyanosis, or edema. Respiratory: Normal respiratory effort, no increased work of breathing. GI: Abdomen is soft, nontender, nondistended, no abdominal masses GU: No CVA tenderness.  Penis without lesions, right testis descended without mass or tenderness.  Marked atrophy of the left testis without mass or tenderness. Skin: No rashes, bruises or suspicious lesions. Lymph: No cervical or inguinal adenopathy. Neurologic: Grossly intact, no focal deficits, moving all 4 extremities. Psychiatric: Normal mood and affect.  Laboratory Data: Lab Results  Component Value Date   WBC 6.4 03/05/2017   HGB 15.4 03/05/2017   HCT 45.7  03/05/2017   MCV 95.5 03/05/2017   PLT 207.0 03/05/2017    Lab Results  Component Value Date   CREATININE 1.00 03/05/2017    No results found for: PSA1  Lab Results  Component Value Date   TESTOSTERONE 497 08/31/2016    Lab Results  Component Value Date   HGBA1C 5.3 02/04/2016    Assessment & Plan:    1. Testicular atrophy Undetermined etiology of his left testicular atrophy however his testosterone level is normal.  2. Erectile dysfunction, unspecified erectile dysfunction type He has no spontaneous erections and is unlikely PDE 5 inhibitors will be beneficial.  If cleared by cardiology this medication would certainly be worth a try.  I also discussed intracavernosal injection therapy and vacuum erection devices and he was provided literature.  Return if symptoms worsen or fail to improve.   Abbie Sons, [place] [address] [place] [facility], [place] [address] [phone]

## 2017-04-23 DIAGNOSIS — N500 Atrophy of testis: Secondary | ICD-10-CM | POA: Insufficient documentation

## 2017-04-27 DIAGNOSIS — C44612 Basal cell carcinoma of skin of right upper limb, including shoulder: Secondary | ICD-10-CM | POA: Diagnosis not present

## 2017-04-27 DIAGNOSIS — L905 Scar conditions and fibrosis of skin: Secondary | ICD-10-CM | POA: Diagnosis not present

## 2017-05-03 ENCOUNTER — Other Ambulatory Visit (INDEPENDENT_AMBULATORY_CARE_PROVIDER_SITE_OTHER): Payer: PPO

## 2017-05-03 ENCOUNTER — Other Ambulatory Visit: Payer: Self-pay | Admitting: Radiology

## 2017-05-03 ENCOUNTER — Telehealth: Payer: Self-pay | Admitting: Cardiovascular Disease

## 2017-05-03 DIAGNOSIS — Z5181 Encounter for therapeutic drug level monitoring: Secondary | ICD-10-CM

## 2017-05-03 DIAGNOSIS — Z7901 Long term (current) use of anticoagulants: Secondary | ICD-10-CM | POA: Diagnosis not present

## 2017-05-03 NOTE — Telephone Encounter (Signed)
Patient calling to state he would like to schedule a stress test any time soon Please call if there are any issues

## 2017-05-03 NOTE — Telephone Encounter (Signed)
S/w patient. He said he would like to hold off on doing the stress test at this time. He stated Dr Rockey Situ said it was up to him as to whether he followed through with the stress test or not, so patient would like to not have it done at this time.  Patient denies any chest pain at this time. Routing to Dr Rockey Situ to make him aware.  Stress test cancelled.

## 2017-05-04 LAB — PROTIME-INR
INR: 2 — ABNORMAL HIGH
PROTHROMBIN TIME: 21.4 s — AB (ref 9.0–11.5)

## 2017-05-06 ENCOUNTER — Other Ambulatory Visit: Payer: PPO

## 2017-05-19 ENCOUNTER — Telehealth: Payer: Self-pay | Admitting: Family Medicine

## 2017-05-19 NOTE — Telephone Encounter (Signed)
Copied from [place]. Topic: Inquiry >> May 19, 2017  4:07 PM Neva Seat wrote: [facility] in [place] contacted pt saying his Bisoprolol 5mg  has been denied by doctor.

## 2017-05-20 ENCOUNTER — Other Ambulatory Visit: Payer: Self-pay | Admitting: Family Medicine

## 2017-05-20 NOTE — Telephone Encounter (Signed)
Called pt. Back and told him to call his pharmacy to contact us if refills needed. Verbalizes understanding.

## 2017-06-03 ENCOUNTER — Other Ambulatory Visit (INDEPENDENT_AMBULATORY_CARE_PROVIDER_SITE_OTHER): Payer: PPO

## 2017-06-03 DIAGNOSIS — Z7901 Long term (current) use of anticoagulants: Secondary | ICD-10-CM | POA: Diagnosis not present

## 2017-06-03 LAB — PROTIME-INR
INR: 1.8 ratio — ABNORMAL HIGH (ref 0.8–1.0)
PROTHROMBIN TIME: 19 s — AB (ref 9.6–13.1)

## 2017-06-04 ENCOUNTER — Other Ambulatory Visit: Payer: Self-pay | Admitting: Family Medicine

## 2017-06-04 DIAGNOSIS — Z5181 Encounter for therapeutic drug level monitoring: Secondary | ICD-10-CM

## 2017-06-04 MED ORDER — WARFARIN SODIUM 7.5 MG PO TABS: ORAL_TABLET | ORAL | 3 refills | Status: DC

## 2017-06-07 ENCOUNTER — Telehealth: Payer: Self-pay | Admitting: Family Medicine

## 2017-06-07 NOTE — Telephone Encounter (Signed)
I filled out the original I 'm pretty sue [name].

## 2017-06-07 NOTE — Telephone Encounter (Signed)
Please advise 

## 2017-06-07 NOTE — Telephone Encounter (Signed)
We will need to send another application. I will work on this. Thanks! - Erabella Kuipers

## 2017-06-07 NOTE — Telephone Encounter (Signed)
Patient states he had spoken to you about getting his repatha renewed so that it would be free, he states he has not heard anything and is on his last dose

## 2017-06-07 NOTE — Telephone Encounter (Signed)
I will forward to [name] and Chrys Racer to see if they can help with this.

## 2017-06-07 NOTE — Telephone Encounter (Signed)
Checking with one of the pharmacists at [facility] to see if they filled out the original patient assistance request.

## 2017-06-07 NOTE — Telephone Encounter (Signed)
Copied from [place] [phone]. Topic: Quick Communication - See Telephone Encounter >> Jun 07, 2017  9:56 AM Bea Graff, NT wrote: CRM for notification. See Telephone encounter for: Pt calling and stated he was suppose to be getting a renewal of Repatha and he has not heard anything. Would like a call from Dr. Tharon Aquas nurse.  06/07/17.

## 2017-06-09 ENCOUNTER — Telehealth: Payer: Self-pay | Admitting: Pharmacist

## 2017-06-09 NOTE — Telephone Encounter (Signed)
Patient currently enrolled in patient assistance program for Repatha. Needs to renew application. Asked patient to bring in finances and medicare card (red white and blue card) for [place] to copy. Will leave completed application at front desk for patient to sign next Monday.   Carlean Jews, Pharm.D., BCPS PGY2 Ambulatory Care Pharmacy Resident Phone: [phone]

## 2017-06-11 DIAGNOSIS — F411 Generalized anxiety disorder: Secondary | ICD-10-CM | POA: Diagnosis not present

## 2017-06-11 DIAGNOSIS — F330 Major depressive disorder, recurrent, mild: Secondary | ICD-10-CM | POA: Diagnosis not present

## 2017-06-14 ENCOUNTER — Telehealth: Payer: Self-pay | Admitting: Pharmacist

## 2017-06-14 NOTE — Telephone Encounter (Signed)
Patient comes to doctor office to fill out paper work Repatha Patient Assistance Information.  Provides financial documentation, insurance cards and requests samples.     Medication Samples have been provided to the patient.  Drug name: Repatha      Strength: 140mg         Qty: 2 pens  LOT: [id number]  Exp.Date: 09/2018  Dosing instructions: Inject once every 2 weeks   The patient has been instructed regarding the correct time, dose, and frequency of taking this medication, including desired effects and most common side effects.   Will submit application after prior authorization approved.  Carlean Jews 5:05 PM 06/14/2017

## 2017-06-16 ENCOUNTER — Telehealth: Payer: Self-pay | Admitting: Family Medicine

## 2017-06-16 NOTE — Telephone Encounter (Signed)
Copied from [place] [phone]. Topic: Quick Communication - See Telephone Encounter >> Jun 16, 2017  3:11 PM Cleaster Corin, [place] wrote: CRM for notification. See Telephone encounter for:   06/16/17. Hassell Done calling from [facility]. Calling to get prior auth. For med.Repatha ref. # [id number] Hassell Done can be reached at [phone]

## 2017-06-16 NOTE — Telephone Encounter (Signed)
Please advise 

## 2017-06-17 ENCOUNTER — Telehealth: Payer: Self-pay | Admitting: Family Medicine

## 2017-06-17 ENCOUNTER — Other Ambulatory Visit: Payer: PPO

## 2017-06-17 ENCOUNTER — Other Ambulatory Visit (INDEPENDENT_AMBULATORY_CARE_PROVIDER_SITE_OTHER): Payer: PPO

## 2017-06-17 DIAGNOSIS — Z5181 Encounter for therapeutic drug level monitoring: Secondary | ICD-10-CM

## 2017-06-17 LAB — PROTIME-INR
INR: 1.7 ratio — ABNORMAL HIGH (ref 0.8–1.0)
Prothrombin Time: 17.8 s — ABNORMAL HIGH (ref 9.6–13.1)

## 2017-06-17 MED ORDER — WARFARIN SODIUM 1 MG PO TABS: ORAL_TABLET | ORAL | 1 refills | Status: DC

## 2017-06-17 MED ORDER — WARFARIN SODIUM 7.5 MG PO TABS: ORAL_TABLET | ORAL | 3 refills | Status: DC

## 2017-06-17 NOTE — Telephone Encounter (Signed)
Is this one that you are working on?

## 2017-06-17 NOTE — Telephone Encounter (Signed)
Spoke with patient regarding INR results.  They remain low.  Will increase his dose to 7.5 mg every day of the week with the exception of Monday when he will take 4.25 mg.  A new 1 mg tablet was sent to his pharmacy.  We will get him scheduled for repeat INR when I see him in the office tomorrow.

## 2017-06-18 ENCOUNTER — Encounter: Payer: Self-pay | Admitting: Family Medicine

## 2017-06-18 ENCOUNTER — Other Ambulatory Visit: Payer: Self-pay

## 2017-06-18 ENCOUNTER — Ambulatory Visit (INDEPENDENT_AMBULATORY_CARE_PROVIDER_SITE_OTHER): Payer: PPO | Admitting: Family Medicine

## 2017-06-18 VITALS — BP 156/106 | HR 62 | Temp 97.5°F | Wt 182.8 lb

## 2017-06-18 DIAGNOSIS — G4710 Hypersomnia, unspecified: Secondary | ICD-10-CM | POA: Diagnosis not present

## 2017-06-18 DIAGNOSIS — I480 Paroxysmal atrial fibrillation: Secondary | ICD-10-CM | POA: Diagnosis not present

## 2017-06-18 DIAGNOSIS — R5382 Chronic fatigue, unspecified: Secondary | ICD-10-CM | POA: Insufficient documentation

## 2017-06-18 DIAGNOSIS — I10 Essential (primary) hypertension: Secondary | ICD-10-CM

## 2017-06-18 DIAGNOSIS — R251 Tremor, unspecified: Secondary | ICD-10-CM | POA: Diagnosis not present

## 2017-06-18 DIAGNOSIS — E782 Mixed hyperlipidemia: Secondary | ICD-10-CM | POA: Diagnosis not present

## 2017-06-18 DIAGNOSIS — L309 Dermatitis, unspecified: Secondary | ICD-10-CM | POA: Diagnosis not present

## 2017-06-18 DIAGNOSIS — Z8673 Personal history of transient ischemic attack (TIA), and cerebral infarction without residual deficits: Secondary | ICD-10-CM | POA: Diagnosis not present

## 2017-06-18 DIAGNOSIS — F329 Major depressive disorder, single episode, unspecified: Secondary | ICD-10-CM | POA: Diagnosis not present

## 2017-06-18 DIAGNOSIS — F419 Anxiety disorder, unspecified: Secondary | ICD-10-CM | POA: Diagnosis not present

## 2017-06-18 LAB — COMPREHENSIVE METABOLIC PANEL
ALBUMIN: 4 g/dL (ref 3.5–5.2)
ALT: 14 U/L (ref 0–53)
AST: 16 U/L (ref 0–37)
Alkaline Phosphatase: 74 U/L (ref 39–117)
BILIRUBIN TOTAL: 0.5 mg/dL (ref 0.2–1.2)
BUN: 19 mg/dL (ref 6–23)
CALCIUM: 9.7 mg/dL (ref 8.4–10.5)
CHLORIDE: 99 meq/L (ref 96–112)
CO2: 33 mEq/L — ABNORMAL HIGH (ref 19–32)
Creatinine, Ser: 1.05 mg/dL (ref 0.40–1.50)
GFR: 73.94 mL/min (ref >60.00)
Glucose, Bld: 97 mg/dL (ref 70–99)
Potassium: 4.7 mEq/L (ref 3.5–5.1)
SODIUM: 137 meq/L (ref 135–145)
TOTAL PROTEIN: 7.2 g/dL (ref 6.0–8.3)

## 2017-06-18 MED ORDER — TRIAMCINOLONE ACETONIDE 0.5 % EX OINT: 1.0000 "application " | TOPICAL_OINTMENT | Freq: Two times a day (BID) | CUTANEOUS | 0 refills | Status: DC

## 2017-06-18 MED ORDER — WARFARIN SODIUM 1 MG PO TABS: ORAL_TABLET | ORAL | 1 refills | Status: DC

## 2017-06-18 NOTE — Assessment & Plan Note (Signed)
Sinus rhythm today.  Continue Coumadin.  I went over his regimen again with them.  The prior prescription for the extra 1 mg tablet was written incorrectly and will be addended.

## 2017-06-18 NOTE — Telephone Encounter (Signed)
Prior authorization submitted 06/18/2017 for Repatha 140 mg q2weeks. Once approved, will proceed with inquiry about copay. If copay is found to be unaffordable by patient, will submit patient assistance application (Has already been filled out by patient and provider).   Carlean Jews, Pharm.D., BCPS PGY2 Ambulatory Care Pharmacy Resident Phone: [phone]

## 2017-06-18 NOTE — Assessment & Plan Note (Signed)
Patient with continued issues with anxiety.  Some depression.  I suspect the tightness that he gets is related to his anxiety.  He has had this for many years and it has been unchanged.  I did discuss his cardiologists recommendation for stress test with him though he does not want to proceed with this.  We will refer to psychology.  He will continue to follow with psychiatry.  He is given return precautions.

## 2017-06-18 NOTE — Assessment & Plan Note (Signed)
Elevated today though has been well controlled recently.  He will start checking at home consistently.  He will return in 1 week for BP check with nursing.

## 2017-06-18 NOTE — Patient Instructions (Signed)
Nice to see you. We will get a sleep study. We will refer you to psychology. We will refer you to neurology. If you develop chest pain, shortness of breath, new numbness or weakness, or any new or changing symptoms please seek medical attention immediately.

## 2017-06-18 NOTE — Assessment & Plan Note (Signed)
Not evidenced on exam.  Discussed referral to neurology for evaluation.

## 2017-06-18 NOTE — Assessment & Plan Note (Signed)
Patient reports eczema that he gets near his nose.  He intermittently uses triamcinolone for this.  This was refilled.

## 2017-06-18 NOTE — Assessment & Plan Note (Signed)
Now on Repatha.  Chronic muscle aches slightly persist.  Improved from prior when he was on statins.

## 2017-06-18 NOTE — Progress Notes (Signed)
Tommi Rumps, MD Phone: [phone]  Yuepheng Schaller is a 72 y.o. male who presents today for follow-up.  Hypertension: Not checking at home.  He is taking bisoprolol, lisinopril, and HCTZ.  Denies shortness of breath.  Denies chest pressure.  He did see cardiology who recommended a stress test for prior chest pain though the patient reports the cardiologist advised him that it was up to the patient whether or not he wanted to proceed given atypical nature of his symptoms.  Patient does note a fair amount of anxiety.  He notes when he gets stressed and anxious he feels a slight tightness in his chest.  This happens at rest.  Does not worsen with exertion.  Is not associated with diaphoresis or shortness of breath.  Does not radiate.  This is not consistent with the pain he had when he had his heart attack.  This type of tightness has been going on for years with his anxiety.  He is on Cymbalta and Lexapro.  He follows with psychiatry.  He is interested in seeing a therapist.  No SI.  Patient has noted mostly a resting tremor in his right hand.  Sometimes it occurs with activity.  No other new neurological symptoms.  He has had a stroke in the past and notes his left grip and left leg are slightly weaker than his right side though they have improved significantly since his stroke.  He continues on warfarin for his A. Fib.  His INR has been low the last 2 times.  His dose was changed yesterday.  I discussed this with him again today.  He does note chronic muscle aches though these have not worsened with the Repatha.  He notes chronic fatigue and decreased energy.  This has been going on since he had a stroke.  He does snore.  His wife has witnessed apneic episodes.  He is never been evaluated for sleep apnea.  Social History   Tobacco Use  Smoking Status Former Smoker  Smokeless Tobacco Never Used     ROS see history of present illness  Objective  Physical Exam Vitals:   06/18/17  1358  BP: (!) 156/106  Pulse: 62  Temp: (!) 97.5 F (36.4 C)  SpO2: 98%    BP Readings from Last 3 Encounters:  06/18/17 (!) 156/106  04/22/17 121/74  04/20/17 108/60   Wt Readings from Last 3 Encounters:  06/18/17 182 lb 12.8 oz (82.9 kg)  04/22/17 183 lb (83 kg)  04/20/17 183 lb 8 oz (83.2 kg)    Physical Exam  Constitutional: No distress.  Cardiovascular: Normal rate, regular rhythm and normal heart sounds.  Pulmonary/Chest: Effort normal and breath sounds normal.  Musculoskeletal: He exhibits no edema.  Neurological: He is alert. Gait normal.  CN 2-12 intact, 4+/5 strength in left grip, left hamstring, and left plantar flexion, otherwise 5/5 strength in bilateral biceps, triceps, right grip, bilateral quads, right hamstrings, right plantar flexion and bilateral dorsiflexion, sensation to light touch intact in bilateral UE and LE, no tremor noted, no cogwheel rigidity  Skin: Skin is warm and dry. He is not diaphoretic.     Assessment/Plan: Please see individual problem list.  A-fib Sinus rhythm today.  Continue Coumadin.  I went over his regimen again with them.  The prior prescription for the extra 1 mg tablet was written incorrectly and will be addended.  Essential hypertension Elevated today though has been well controlled recently.  He will start checking at home consistently.  He  will return in 1 week for BP check with nursing.  Anxiety and depression Patient with continued issues with anxiety.  Some depression.  I suspect the tightness that he gets is related to his anxiety.  He has had this for many years and it has been unchanged.  I did discuss his cardiologists recommendation for stress test with him though he does not want to proceed with this.  We will refer to psychology.  He will continue to follow with psychiatry.  He is given return precautions.  Chronic fatigue I suspect his prior stroke is contributing to this though he does have some findings concerning  for sleep apnea.  Will obtain a sleep study.  Epworth Sleepiness Scale score of 15.  Tremor Not evidenced on exam.  Discussed referral to neurology for evaluation.  Hyperlipidemia Now on Repatha.  Chronic muscle aches slightly persist.  Improved from prior when he was on statins.  Eczema Patient reports eczema that he gets near his nose.  He intermittently uses triamcinolone for this.  This was refilled.   Miroslav was seen today for follow-up.  Diagnoses and all orders for this visit:  Tremor -     Ambulatory referral to Neurology  History of completed stroke -     Ambulatory referral to Neurology  Hypersomnia -     Split night study; Future  Anxiety and depression -     Ambulatory referral to Psychology  Essential hypertension -     Comp Met (CMET)  Paroxysmal atrial fibrillation (HCC)  Chronic fatigue  Mixed hyperlipidemia  Eczema, unspecified type  Other orders -     triamcinolone ointment (KENALOG) 0.5 %; Apply 1 application topically 2 (two) times daily. -     warfarin (COUMADIN) 1 MG tablet; Take 1 tablet (1 mg) by mouth each Monday with the warfarin 3.75 mg (1/2 tablet) that you take for total dose of 4.75 mg on Monday.    Orders Placed This Encounter  Procedures  . Comp Met (CMET)  . Ambulatory referral to Neurology    Referral Priority:   Routine    Referral Type:   Consultation    Referral Reason:   Specialty Services Required    Requested Specialty:   Neurology    Number of Visits Requested:   1  . Ambulatory referral to Psychology    Referral Priority:   Routine    Referral Type:   Psychiatric    Referral Reason:   Specialty Services Required    Requested Specialty:   Psychology    Number of Visits Requested:   1  . Split night study    Standing Status:   Future    Standing Expiration Date:   06/18/2018    Order Specific Question:   Where should this test be performed:    Answer:   [facility]    Meds ordered this encounter  Medications  .  triamcinolone ointment (KENALOG) 0.5 %    Sig: Apply 1 application topically 2 (two) times daily.    Dispense:  30 g    Refill:  0  . warfarin (COUMADIN) 1 MG tablet    Sig: Take 1 tablet (1 mg) by mouth each Monday with the warfarin 3.75 mg (1/2 tablet) that you take for total dose of 4.75 mg on Monday.    Dispense:  12 tablet    Refill:  [place], MD [place]

## 2017-06-18 NOTE — Telephone Encounter (Signed)
noted 

## 2017-06-18 NOTE — Assessment & Plan Note (Signed)
I suspect his prior stroke is contributing to this though he does have some findings concerning for sleep apnea.  Will obtain a sleep study.  Epworth Sleepiness Scale score of 15.

## 2017-06-24 ENCOUNTER — Ambulatory Visit (INDEPENDENT_AMBULATORY_CARE_PROVIDER_SITE_OTHER): Payer: PPO

## 2017-06-24 DIAGNOSIS — I10 Essential (primary) hypertension: Secondary | ICD-10-CM | POA: Diagnosis not present

## 2017-06-24 NOTE — Progress Notes (Signed)
Patient comes in today for a nurse visit. He was seen in the office by Dr.Sonnneberg on 06/18/2017 and his blood pressure was 156/106. Patient is currently taking bisoprolol, lisinopril and HCTZ. Today his blood pressure in the left arm is 126/80 and pulse is 71.  Patient would like the triamcinolone ointment switched to a cream if possible, it was prescribed on 06/18/17.

## 2017-06-26 MED ORDER — TRIAMCINOLONE ACETONIDE 0.5 % EX CREA: 1.0000 "application " | TOPICAL_CREAM | Freq: Two times a day (BID) | CUTANEOUS | 0 refills | Status: DC | PRN

## 2017-06-26 NOTE — Progress Notes (Signed)
Blood pressure is well controlled.  He should continue his current regimen.  He needs to continue to check at home.  I will send in the new triamcinolone.  Please reinforce with the patient that he should not places near his eyes.  Thanks.

## 2017-06-26 NOTE — Addendum Note (Signed)
Addended by: Caryl Bis, Yanil Dawe G on: 06/26/2017 12:03 PM   Modules accepted: Orders

## 2017-06-29 ENCOUNTER — Other Ambulatory Visit: Payer: Self-pay | Admitting: Family Medicine

## 2017-06-29 NOTE — Progress Notes (Signed)
Patient notified

## 2017-07-01 ENCOUNTER — Other Ambulatory Visit (INDEPENDENT_AMBULATORY_CARE_PROVIDER_SITE_OTHER): Payer: PPO

## 2017-07-01 DIAGNOSIS — Z5181 Encounter for therapeutic drug level monitoring: Secondary | ICD-10-CM

## 2017-07-01 LAB — PROTIME-INR
INR: 1.7 ratio — AB (ref 0.8–1.0)
Prothrombin Time: 18.2 s — ABNORMAL HIGH (ref 9.6–13.1)

## 2017-07-02 ENCOUNTER — Telehealth: Payer: Self-pay

## 2017-07-02 NOTE — Telephone Encounter (Signed)
Copied from [place] [id number]. Topic: Quick Communication - See Telephone Encounter >> Jul 01, 2017  5:11 PM Boyd Kerbs wrote: CRM for notification. See Telephone encounter for:   Patient is returning a call, not sure who from and no CRM in.  Please call patient back   07/01/17.

## 2017-07-16 ENCOUNTER — Encounter (HOSPITAL_COMMUNITY): Payer: Self-pay | Admitting: Emergency Medicine

## 2017-07-16 ENCOUNTER — Emergency Department (HOSPITAL_COMMUNITY): Payer: PPO

## 2017-07-16 ENCOUNTER — Emergency Department (HOSPITAL_COMMUNITY)
Admission: EM | Admit: 2017-07-16 | Discharge: 2017-07-16 | Disposition: A | Discharge status: Home / Self Care | Payer: PPO | Attending: Emergency Medicine | Admitting: Emergency Medicine

## 2017-07-16 ENCOUNTER — Other Ambulatory Visit (INDEPENDENT_AMBULATORY_CARE_PROVIDER_SITE_OTHER): Payer: PPO

## 2017-07-16 ENCOUNTER — Telehealth: Payer: Self-pay

## 2017-07-16 DIAGNOSIS — Z955 Presence of coronary angioplasty implant and graft: Secondary | ICD-10-CM | POA: Insufficient documentation

## 2017-07-16 DIAGNOSIS — M545 Low back pain: Secondary | ICD-10-CM | POA: Diagnosis not present

## 2017-07-16 DIAGNOSIS — Z7901 Long term (current) use of anticoagulants: Secondary | ICD-10-CM | POA: Insufficient documentation

## 2017-07-16 DIAGNOSIS — R0781 Pleurodynia: Secondary | ICD-10-CM | POA: Diagnosis not present

## 2017-07-16 DIAGNOSIS — Z87891 Personal history of nicotine dependence: Secondary | ICD-10-CM | POA: Diagnosis not present

## 2017-07-16 DIAGNOSIS — Y998 Other external cause status: Secondary | ICD-10-CM | POA: Diagnosis not present

## 2017-07-16 DIAGNOSIS — Z7982 Long term (current) use of aspirin: Secondary | ICD-10-CM | POA: Insufficient documentation

## 2017-07-16 DIAGNOSIS — Z85828 Personal history of other malignant neoplasm of skin: Secondary | ICD-10-CM | POA: Diagnosis not present

## 2017-07-16 DIAGNOSIS — Y9389 Activity, other specified: Secondary | ICD-10-CM | POA: Insufficient documentation

## 2017-07-16 DIAGNOSIS — S20212A Contusion of left front wall of thorax, initial encounter: Secondary | ICD-10-CM | POA: Diagnosis not present

## 2017-07-16 DIAGNOSIS — S0990XA Unspecified injury of head, initial encounter: Secondary | ICD-10-CM | POA: Diagnosis not present

## 2017-07-16 DIAGNOSIS — M62830 Muscle spasm of back: Secondary | ICD-10-CM | POA: Diagnosis not present

## 2017-07-16 DIAGNOSIS — Y929 Unspecified place or not applicable: Secondary | ICD-10-CM | POA: Insufficient documentation

## 2017-07-16 DIAGNOSIS — T148XXA Other injury of unspecified body region, initial encounter: Secondary | ICD-10-CM | POA: Diagnosis not present

## 2017-07-16 DIAGNOSIS — Z79899 Other long term (current) drug therapy: Secondary | ICD-10-CM | POA: Diagnosis not present

## 2017-07-16 DIAGNOSIS — W19XXXA Unspecified fall, initial encounter: Secondary | ICD-10-CM

## 2017-07-16 DIAGNOSIS — W07XXXA Fall from chair, initial encounter: Secondary | ICD-10-CM | POA: Diagnosis not present

## 2017-07-16 DIAGNOSIS — Z8673 Personal history of transient ischemic attack (TIA), and cerebral infarction without residual deficits: Secondary | ICD-10-CM | POA: Insufficient documentation

## 2017-07-16 DIAGNOSIS — Z5181 Encounter for therapeutic drug level monitoring: Secondary | ICD-10-CM | POA: Diagnosis not present

## 2017-07-16 DIAGNOSIS — I2510 Atherosclerotic heart disease of native coronary artery without angina pectoris: Secondary | ICD-10-CM | POA: Insufficient documentation

## 2017-07-16 DIAGNOSIS — S299XXA Unspecified injury of thorax, initial encounter: Secondary | ICD-10-CM | POA: Diagnosis not present

## 2017-07-16 DIAGNOSIS — M549 Dorsalgia, unspecified: Secondary | ICD-10-CM | POA: Diagnosis not present

## 2017-07-16 DIAGNOSIS — M5489 Other dorsalgia: Secondary | ICD-10-CM | POA: Diagnosis not present

## 2017-07-16 DIAGNOSIS — M546 Pain in thoracic spine: Secondary | ICD-10-CM | POA: Diagnosis not present

## 2017-07-16 LAB — PROTIME-INR
INR: 2.7 ratio — AB (ref 0.8–1.0)
PROTHROMBIN TIME: 29.2 s — AB (ref 9.6–13.1)

## 2017-07-16 MED ORDER — HYDROCODONE-ACETAMINOPHEN 5-325 MG PO TABS: 1.0000 | ORAL_TABLET | Freq: Four times a day (QID) | ORAL | 0 refills | Status: DC | PRN

## 2017-07-16 MED ORDER — CYCLOBENZAPRINE HCL 10 MG PO TABS: 10.0000 mg | ORAL_TABLET | Freq: Three times a day (TID) | ORAL | 0 refills | Status: DC | PRN

## 2017-07-16 MED ORDER — HYDROCODONE-ACETAMINOPHEN 5-325 MG PO TABS
1.0000 | ORAL_TABLET | Freq: Once | ORAL | Status: AC
  Administered 2017-07-16: 1 via ORAL
  Filled 2017-07-16: qty 1

## 2017-07-16 MED ORDER — CYCLOBENZAPRINE HCL 10 MG PO TABS
10.0000 mg | ORAL_TABLET | Freq: Once | ORAL | Status: AC
  Administered 2017-07-16: 10 mg via ORAL
  Filled 2017-07-16: qty 1

## 2017-07-16 NOTE — ED Triage Notes (Signed)
Per EMS-states mechanical fall from standing position-fell in door frame and then to floor-no LOC-patient went to PCP to have his PT/INR checked-did not mention fall to MD-when he got home he stated having increased pain on left lower back-increased pain with movement

## 2017-07-16 NOTE — ED Notes (Signed)
Bed: [facility] Expected date:  Expected time:  Means of arrival:  Comments: EMS fall 

## 2017-07-16 NOTE — ED Provider Notes (Signed)
[facility] DEPT Provider Note   CSN: [id number] Arrival date & time: 07/16/17  1729     History   Chief Complaint Chief Complaint  Patient presents with  . Fall  . Back Pain    HPI [name] is a 72 y.o. male with a PMHx of Afib on coumadin, BPH, HLD, HTN, IBS, and other conditions listed below, who presents to the ED with complaints of mechanical fall from a computer chair around 1pm today.  Patient states that he was standing on a computer chair when it started spinning causing him to fall backwards and striking his left back on the door frame and then coming down to the ground and striking his head fairly gently on the ground.  He complains of 10/10 sharp intermittent left mid to low back pain which occasionally radiates into the LLQ, worse with movement and breathing, and with no treatments tried prior to arrival.  States his pain mostly just happens when he moves or breathes.  He also reports an associated bruise on his left back.  He denies LOC, denies any headache, vision changes, lightheadedness, or any presyncopal symptoms before the fall.  He states that this was simply a mechanical fall due to standing on a chair that moves.  He denies any recent fevers or chills, chest pain, shortness of breath, nausea, vomiting, diarrhea, constipation, incontinence of urine or stool, saddle anesthesia or cauda equina symptoms, neck pain, numbness, tingling, focal weakness, other injuries sustained, or any other complaints at this time.  Of note, chart review reveals that he had his INR checked today and it was 2.7.     The history is provided by the patient and medical records. No language interpreter was used.  Fall  This is a new problem. The current episode started 6 to 12 hours ago. The problem occurs rarely. The problem has not changed since onset.Associated symptoms include abdominal pain (radiating from back). Pertinent negatives include no chest pain, no  headaches and no shortness of breath. Exacerbated by: movement and breathing. Nothing relieves the symptoms. He has tried nothing for the symptoms. The treatment provided no relief.  Back Pain   Associated symptoms include abdominal pain (radiating from back). Pertinent negatives include no chest pain, no fever, no numbness, no headaches and no weakness.    Past Medical History:  Diagnosis Date  . A-fib ([place])   . BPH (benign prostatic hyperplasia)   . Coronary artery disease    Hx of MI; S/P PCI  . Depression   . GERD (gastroesophageal reflux disease)   . Hyperlipidemia   . Hypertension   . IBS (irritable bowel syndrome)   . IBS (irritable bowel syndrome)   . PNA (pneumonia)   . Skin cancer   . Stroke [facility])     Patient Active Problem List   Diagnosis Date Noted  . Chronic fatigue 06/18/2017  . Tremor 06/18/2017  . Eczema 06/18/2017  . Testicular atrophy 04/23/2017  . Dyspnea on exertion 03/16/2017  . Erectile dysfunction 03/05/2017  . Preventative health care 02/11/2016  . Anxiety and depression 02/08/2015  . GERD (gastroesophageal reflux disease) 02/08/2015  . IBS (irritable bowel syndrome) 02/08/2015  . Essential hypertension 02/08/2015  . A-fib ([place]) 01/23/2015  . CAD (coronary artery disease) 01/23/2015  . Hyperlipidemia 01/23/2015  . History of CVA (cerebrovascular accident) 01/23/2015    Past Surgical History:  Procedure Laterality Date  . ADENOIDECTOMY  1951  . CARDIAC CATHETERIZATION  2006   X1 STENT  .  SKIN CANCER EXCISION N/A 08/2016   Forehead.  [facility] (Dr. Deliah Boston)  . THYROIDECTOMY, PARTIAL         Home Medications    Prior to Admission medications   Medication Sig Start Date End Date Taking? Authorizing Provider  aspirin 81 MG tablet Take 81 mg by mouth daily.   Yes [provider]  bisoprolol (ZEBETA) 5 MG tablet TAKE 1 TABLET (5 MG TOTAL) BY MOUTH DAILY. 05/21/17  Yes Leone Haven, MD  Cholecalciferol  (VITAMIN D) 2000 UNITS CAPS Take 2,000 Units by mouth 2 (two) times daily.    Yes [provider]  Coenzyme Q10 (CO Q 10 PO) Take 1 tablet by mouth at bedtime.   Yes [provider]  dicyclomine (BENTYL) 10 MG capsule TAKE 1 CAPSULE (10 MG TOTAL) BY MOUTH 2 (TWO) TIMES DAILY. 06/29/17  Yes Leone Haven, MD  DULoxetine (CYMBALTA) 20 MG capsule Take 40 mg by mouth daily.   Yes [provider]  escitalopram (LEXAPRO) 20 MG tablet TAKE 1 TABLET (20 MG TOTAL) BY MOUTH DAILY. 02/03/17  Yes Cook, Jayce G, DO  Evolocumab (REPATHA [place]) Inject 1 application into the skin every 21 ( twenty-one) days.   Yes [provider]  hydrochlorothiazide (HYDRODIURIL) 12.5 MG tablet Take 1 tablet (12.5 mg total) by mouth daily. 03/16/17  Yes Leone Haven, MD  lisinopril (PRINIVIL,ZESTRIL) 20 MG tablet TAKE 1 TABLET BY MOUTH TWICE A DAY 09/17/16  Yes Cook, Jayce G, DO  ranitidine (ZANTAC) 150 MG tablet TAKE 1 TABLET (150 MG TOTAL) BY MOUTH 2 (TWO) TIMES DAILY. 09/17/16  Yes Cook, Jayce G, DO  triamcinolone cream (KENALOG) 0.5 % Apply 1 application topically 2 (two) times daily as needed. For rash 06/26/17  Yes Leone Haven, MD  vitamin B-12 (CYANOCOBALAMIN) 1000 MCG tablet Take 1,000 mcg by mouth daily.   Yes [provider]  warfarin (COUMADIN) 1 MG tablet Take 1 tablet (1 mg) by mouth each Monday with the warfarin 3.75 mg (1/2 tablet) that you take for total dose of 4.75 mg on Monday. 06/18/17  Yes Leone Haven, MD  warfarin (COUMADIN) 7.5 MG tablet Take half a tablet by mouth on Monday. Remainder of week 1 tablet daily. 06/17/17  Yes Leone Haven, MD  nitroGLYCERIN (NITROSTAT) 0.4 MG SL tablet Place 1 tablet (0.4 mg total) every 5 (five) minutes as needed under the tongue for chest pain. 04/16/17   Leone Haven, MD    Family History Family History  Problem Relation Age of Onset  . Heart Problems Father   . Heart disease Father   . Hypertension  Father   . Mental illness Mother   . Arthritis Mother   . Cancer Mother        ovarian  . Stroke Paternal Uncle   . Heart disease Paternal Grandfather   . Cancer Maternal Aunt   . Stroke Maternal Grandmother   . Sudden death Maternal Grandfather   . Heart disease Paternal Grandmother     Social History Social History   Tobacco Use  . Smoking status: Former Research scientist (life sciences)  . Smokeless tobacco: Never Used  Substance Use Topics  . Alcohol use: Yes    Alcohol/week: 1.2 - 1.8 oz    Types: 2 - 3 Glasses of wine per week    Comment: Wine daily with dinner  . Drug use: No     Allergies   Bactrim [sulfamethoxazole-trimethoprim] and Crestor [rosuvastatin calcium]  Review of Systems Review of Systems  Constitutional: Negative for chills and fever.  Eyes: Negative for visual disturbance.  Respiratory: Negative for shortness of breath.   Cardiovascular: Negative for chest pain.  Gastrointestinal: Positive for abdominal pain (radiating from back). Negative for constipation, diarrhea, nausea and vomiting.  Genitourinary: Negative for difficulty urinating (no incontinence).  Musculoskeletal: Positive for back pain. Negative for neck pain.  Skin: Positive for color change (bruise L back). Negative for wound.  Allergic/Immunologic: Negative for immunocompromised state.  Neurological: Negative for syncope, weakness, light-headedness, numbness and headaches.  Hematological: Bruises/bleeds easily (on coumadin).  Psychiatric/Behavioral: Negative for confusion.   All other systems reviewed and are negative for acute change except as noted in the HPI.    Physical Exam Updated Vital Signs BP 133/80 (BP Location: Right Arm)   Pulse 68   Temp 98.4 F (36.9 C) (Oral)   Resp 14   SpO2 98%   Physical Exam  Constitutional: He is oriented to person, place, and time. Vital signs are normal. He appears well-developed and well-nourished.  Non-toxic appearance. No distress.  Afebrile, nontoxic, NAD    HENT:  Head: Normocephalic and atraumatic. Head is without raccoon's eyes, without Battle's sign, without abrasion and without contusion.  Mouth/Throat: Oropharynx is clear and moist and mucous membranes are normal.  [place]/AT, no scalp tenderness or crepitus, no skull depression or deformity, no raccoon eyes or battle's sign, no abrasions or contusions  Eyes: Conjunctivae and EOM are normal. Pupils are equal, round, and reactive to light. Right eye exhibits no discharge. Left eye exhibits no discharge.  PERRL, EOMI, no nystagmus, no visual field deficits   Neck: Normal range of motion. Neck supple. No spinous process tenderness and no muscular tenderness present. No neck rigidity. Normal range of motion present.  FROM intact without spinous process TTP, no bony stepoffs or deformities, no paraspinous muscle TTP or muscle spasms. No rigidity or meningeal signs. No bruising or swelling.   Cardiovascular: Normal rate, regular rhythm, normal heart sounds and intact distal pulses. Exam reveals no gallop and no friction rub.  No murmur heard. Pulmonary/Chest: Effort normal and breath sounds normal. No respiratory distress. He has no decreased breath sounds. He has no wheezes. He has no rhonchi. He has no rales.  Abdominal: Soft. Normal appearance and bowel sounds are normal. He exhibits no distension. There is no tenderness. There is no rigidity, no rebound, no guarding, no tenderness at McBurney's point and negative Murphy's sign.  Soft, NTND, +BS throughout, no r/g/r, neg murphy's, neg mcburney's   Musculoskeletal:       Thoracic back: He exhibits decreased range of motion (due to pain), tenderness, bony tenderness and spasm. He exhibits no swelling and no deformity.       Lumbar back: He exhibits decreased range of motion (due to pain), tenderness, bony tenderness and spasm. He exhibits no swelling and no deformity.       Back:  C-spine as above.  Thoracic and lumbar spinal levels with mildly limited  ROM due to pain, without spinous process TTP, no bony stepoffs or deformities, with diffuse L sided posterior rib cage and paraspinous muscle TTP from just below the scapula down to the lower lumbar area above the glutes, +muscle spasms. Strength and sensation grossly intact in all extremities, gait not initially assessed due to pain. Faint mild bruise over the L posterior rib cage. Distal pulses intact. No other areas of tenderness to hips or extremities.   Neurological: He is alert and oriented  to person, place, and time. He has normal strength. No cranial nerve deficit or sensory deficit. Coordination normal. GCS eye subscore is 4. GCS verbal subscore is 5. GCS motor subscore is 6.  CN 2-12 grossly intact A&O x4 GCS 15 Sensation and strength intact Coordination with finger-to-nose WNL  Skin: Skin is warm, dry and intact. Bruising noted. No rash noted.  Faint bruising to L posterior rib cage  Psychiatric: He has a normal mood and affect.  Nursing note and vitals reviewed.    ED Treatments / Results  Labs (all labs ordered are listed, but only abnormal results are displayed) Labs Reviewed - No data to display  EKG  EKG Interpretation None       Radiology Dg Ribs Unilateral W/chest Left  Result Date: 07/16/2017 CLINICAL DATA:  Pain following fall EXAM: LEFT RIBS AND CHEST - 3+ VIEW COMPARISON:  None. FINDINGS: Frontal chest as well as oblique and cone-down rib images obtained. There is atelectatic change in the lateral left base. Lungs elsewhere are clear. Heart size and pulmonary vascularity are normal. No adenopathy. No pneumothorax or pleural effusion evident.  No rib fracture. IMPRESSION: Lateral left base atelectasis. Lungs elsewhere clear. No rib fracture evident. Electronically Signed   By: Lowella Grip III M.D.   On: 07/16/2017 19:52   Dg Thoracic Spine W/swimmers  Result Date: 07/16/2017 CLINICAL DATA:  Pain following fall EXAM: THORACIC SPINE - 3 VIEWS COMPARISON:  None.  FINDINGS: Frontal, lateral, and swimmer's views were obtained. There is no appreciable fracture or spondylolisthesis. There is slight disc space narrowing at several levels. No erosive change or paraspinous lesion. IMPRESSION: Slight osteoarthritic change at several levels. No fracture or spondylolisthesis. Electronically Signed   By: Lowella Grip III M.D.   On: 07/16/2017 19:50   Dg Lumbar Spine Complete  Result Date: 07/16/2017 CLINICAL DATA:  Rib pain after falling.  Low back pain. EXAM: LUMBAR SPINE - COMPLETE 4+ VIEW COMPARISON:  None. FINDINGS: There is no evidence of lumbar spine fracture. Alignment is normal. Intervertebral disc spaces are maintained. IMPRESSION: No acute abnormality of the lumbar spine. Mild degenerative change for age. Electronically Signed   By: Ulyses Jarred M.D.   On: 07/16/2017 19:53   Ct Head Wo Contrast  Result Date: 07/16/2017 CLINICAL DATA:  Mechanical fall EXAM: CT HEAD WITHOUT CONTRAST TECHNIQUE: Contiguous axial images were obtained from the base of the skull through the vertex without intravenous contrast. COMPARISON:  None. FINDINGS: Brain: No acute territorial infarction, hemorrhage or intracranial mass is visualized. Minimal small vessel ischemic changes of the white matter. Mild atrophy. Vascular: No hyperdense vessels.  Carotid vascular calcification. Skull: Normal. Negative for fracture or focal lesion. Sinuses/Orbits: No acute finding. Other: None IMPRESSION: No CT evidence for acute intracranial abnormality. Atrophy and mild small vessel ischemic changes of the white matter Electronically Signed   By: Donavan Foil M.D.   On: 07/16/2017 19:41    Procedures Procedures (including critical care time)  Medications Ordered in ED Medications  HYDROcodone-acetaminophen (NORCO/VICODIN) 5-325 MG per tablet 1 tablet (1 tablet Oral Given 07/16/17 1949)     Initial Impression / Assessment and Plan / ED Course  I have reviewed the triage vital signs and the  nursing notes.  Pertinent labs & imaging results that were available during my care of the patient were reviewed by me and considered in my medical decision making (see chart for details).     72 y.o. male here after mechanical fall off computer chair which  spun and he fell back striking the door frame with his L back, and then going to floor and striking his head. He c/o L mid-to-low back pain that radiates to LLQ and bruise to L back, denies headache or other complaints. On exam, no scalp tenderness or crepitus/deformity, no racoon eyes or battle's sign, no abrasions or contusions, PERRL, EOMI, cervical spine without tenderness, no abdominal tenderness, moderate tenderness to L flank area and posterior rib cage extending to lumbar paraspinous muscle area, no midline spinal TTP, mild bruising to L posterior rib cage, no crepitus or deformity, extremities NVI with soft compartments, no other areas of tenderness elsewhere. Given that he's on coumadin, will get CT head to ensure no intracranial bleed; will also get xray of L ribs and thoracic/lumbar spine. Doubt need for CT abd/pelv or chest at this time. Will give pain meds and reassess shortly. Doubt need for labs since this was a mechanical fall, and his INR was checked today (2.7). Will reassess shortly. Discussed case with my attending Dr. Darl Householder who agrees with plan.   9:31 PM CT head negative for acute finding. Lumbar xray with mild degenerative changes but otherwise no acute findings. Thoracic spine xray with slight osteoarthritic changes at several levels but otherwise no acute findings. L rib xray negative for definite rib fx, L lateral base atelectasis but otherwise no acute findings. Pt feeling better after vicodin, ambulatory without difficulty. Will treat as suspected occult rib fx and back/rib contusion, will give incentive spirometer and discussed proper use of this. Will send home with pain meds and muscle relaxant, proper use of these advised.  Discussed use of ice/heat as well. F/up with PCP in 5-7 days for recheck of symptoms and ongoing management. I explained the diagnosis and have given explicit precautions to return to the ER including for any other new or worsening symptoms. The patient understands and accepts the medical plan as it's been dictated and I have answered their questions. Discharge instructions concerning home care and prescriptions have been given. The patient is STABLE and is discharged to home in good condition.    Final Clinical Impressions(s) / ED Diagnoses   Final diagnoses:  Back pain  Fall, initial encounter  Other acute back pain  Contusion of rib on left side, initial encounter  Back muscle spasm  On continuous oral anticoagulation  Injury of head, initial encounter    ED Discharge Orders        Ordered    HYDROcodone-acetaminophen (NORCO) 5-325 MG tablet  Every 6 hours PRN     07/16/17 2130    cyclobenzaprine (FLEXERIL) 10 MG tablet  3 times daily PRN     07/16/17 [address] [place], [place] 07/16/17 2131    Drenda Freeze, MD 07/16/17 2236

## 2017-07-16 NOTE — Telephone Encounter (Signed)
Patient should be evaluated for this.  Please reinforce that he go to urgent care or emergency department for eval.

## 2017-07-16 NOTE — Discharge Instructions (Signed)
Don't ever stand on a chair that swivels or moves!  Your xray does not show a rib fracture, however sometimes rib fractures are hard to see on xray. It's very important that you use the incentive spirometer hourly while awake, in order to keep your lungs fully inflated. Also perform a good hard cough every hour after the incentive spirometer use. You can consider using a LOOSELY WRAPPED ace wrap around the area, but do NOT put it on too tight where it would cause you to not be able to take a full deep breath. Alternate between ibuprofen and norco as directed, as needed for pain but do not drive or operate machinery with pain medication use.  Use flexeril as directed as needed for muscle spasms, but don't drive or operate machinery while taking this medication either. You may consider using ice and/or heat to the areas of pain, no more than 20 minutes every hour for either one. Follow up with your regular doctor in 5-7 days for recheck of symptoms. Return to the ER for changes or worsening symptoms.

## 2017-07-16 NOTE — Telephone Encounter (Signed)
Reason for call: fall  @ 130pm   Symptoms: fell 3 feet , fell on back , hit door frame having  Left sided  back pain  radiating around the side,  ,  hit head , having rib pain hard time breathing due to pain  , Advised of local urgent care wife would rather take to ED for evaluation  Duration 130 pm  Medications: Last seen for this problem: Seen by: Advised patient couldn't do xray without evaluation Advised patient not to drive due to head injury, wife will take by private vehicle

## 2017-07-16 NOTE — Telephone Encounter (Signed)
Leftvoicemail to call  

## 2017-07-16 NOTE — Telephone Encounter (Signed)
Copied from [place]. Topic: Inquiry >> Jul 16, 2017  3:39 PM [name] wrote: Reason for CRM: pt called to state that he has fallen and is having back pain and is wanting a xray done, contact pt to advise

## 2017-07-20 ENCOUNTER — Telehealth: Payer: Self-pay | Admitting: Family Medicine

## 2017-07-20 NOTE — Telephone Encounter (Signed)
Patient and spouse arrived in clinic a day before appointment for follow-up after a fall.  They demanded to be seen today.  Front staff called back to provider whose schedule is completely booked.  Provider asked that I speak with them.  Brought patient and spouse into my office to discuss their concerns.  Spouse stated that she knew the appointment was for tomorrow but did get confused and arrived today.  I advised that there unfortunately are no openings and Dr. Caryl Bis wants to be able to spend the time that the patient needs/deserves reviewing the notes with the patient from his recent visit to the ED.  Patient stated "then I just wasted my time coming in."  He went further to say, "I just need him to look at my back.  I don't need anything else".  I asked if he'd had any new symptoms to which his spouse replied, "No he is just in pain from the fall last Friday and he fell again last night.  He was standing in a chair and fell and re-hurt his back.  If he'd fallen the other way he would have fell over the rail onto his head."  I asked if he'd hit his head with his recent fall and they advised "No".  They did show me the medication that was prescribed for the patient from the ED doctor and expressed that it was not helping much and he was having trouble sleeping due to the pain.  The patient asked, "So am I just supposed to deal with this until tomorrow?"  I advised that unfortunately they did come in a day early for this follow-up and as much as we'd like to be able to work them in, the schedule just did not allow for it.  I went further to say that if there were to be a change or cancellation I would let them know.  The patient's spouse made a remark regarding lack of concern for the patient and only being focused on the business aspect of healthcare.  I responded by saying "I really hate that you feel that way as that is the exact opposite of what we are trying to do.  We want to respect your time and the  time of our other patients.  The follow-up you are being seen for will require Dr. Caryl Bis to review the ED notes in addition to any scans or labs that were performed.  He will need to be able to dedicate a full visit with you and a work-in for that just isn't appropriate."  They left and were visibly still frustrated.  I apologized again advised that we will them tomorrow at 11:30am.

## 2017-07-20 NOTE — Telephone Encounter (Signed)
Patient seen in ER

## 2017-07-21 ENCOUNTER — Ambulatory Visit (INDEPENDENT_AMBULATORY_CARE_PROVIDER_SITE_OTHER): Payer: PPO | Admitting: Family Medicine

## 2017-07-21 ENCOUNTER — Encounter: Payer: Self-pay | Admitting: Family Medicine

## 2017-07-21 VITALS — BP 140/86 | HR 76 | Temp 98.5°F | Wt 190.4 lb

## 2017-07-21 DIAGNOSIS — K5900 Constipation, unspecified: Secondary | ICD-10-CM | POA: Insufficient documentation

## 2017-07-21 DIAGNOSIS — Z5181 Encounter for therapeutic drug level monitoring: Secondary | ICD-10-CM | POA: Diagnosis not present

## 2017-07-21 DIAGNOSIS — W19XXXA Unspecified fall, initial encounter: Secondary | ICD-10-CM

## 2017-07-21 DIAGNOSIS — Z7901 Long term (current) use of anticoagulants: Secondary | ICD-10-CM | POA: Diagnosis not present

## 2017-07-21 LAB — CBC
HCT: 42 % (ref 39.0–52.0)
Hemoglobin: 14.1 g/dL (ref 13.0–17.0)
MCHC: 33.5 g/dL (ref 30.0–36.0)
MCV: 94.7 fl (ref 78.0–100.0)
PLATELETS: 217 10*3/uL (ref 150.0–400.0)
RBC: 4.43 Mil/uL (ref 4.22–5.81)
RDW: 13.4 % (ref 11.5–15.5)
WBC: 5.9 10*3/uL (ref 4.0–10.5)

## 2017-07-21 MED ORDER — HYDROCODONE-ACETAMINOPHEN 5-325 MG PO TABS: 1.0000 | ORAL_TABLET | Freq: Four times a day (QID) | ORAL | 0 refills | Status: DC | PRN

## 2017-07-21 NOTE — Assessment & Plan Note (Signed)
Suspect slightly worsened by hydrocodone.  Encouraged fiber supplementation.  They can add MiraLAX over-the-counter if not improving.

## 2017-07-21 NOTE — Assessment & Plan Note (Addendum)
Patient with mechanical fall from a computer chair.  Injury to back is likely soft tissue related.  Suspect strain of intercostal muscles and back muscles.  Imaging in the ED was unremarkable for acute issues and the reports have been reviewed.  The note from the ED physician was reviewed as well.  I discussed that it would likely take some time for him to completely improve given the lack of rest that he can give his ribs.  Encourage incentive spirometer use.  Given prior wheezing I discussed doing a chest x-ray to repeat and evaluate for any underlying infectious cause though the patient declined this currently given lack of other symptoms.  If he develops new symptoms he will let us know we can complete a chest x-ray.  Discussed discontinuing the Flexeril given the excessive drowsiness.  He can take if needed if the hydrocodone has not taken care of his pain.  He will come off of the hydrocodone as needed.  Database was reviewed for the refill of this.  He has been improving over the last day or so.  Discussed monitoring his symptoms.  If he does not continue to improve with regards to his pain he will let us know.  I advised him not to climb any ladders or get on any chairs anymore.  He was given return precautions.

## 2017-07-21 NOTE — Progress Notes (Addendum)
William Rumps, MD Phone: [phone]  [name] is a 72 y.o. male who presents today for ED follow-up.  Patient was seen in the emergency department on 07/16/17 after falling after standing in a computer chair.  He fell backwards and struck his left back on a door frame and then fell to the ground striking his head gently.  He had fairly significant intermittent left mid and low back discomfort that occasionally radiated into the left lower quadrant.  This is worse with movement and breathing.  He had no loss of consciousness.  He had no headache.  He had an extensive evaluation with chest x-ray and rib films as well as lumbar spine and thoracic spine x-ray and CT head.  There was no evidence of fracture or acute changes on his imaging.  They discharged him with an incentive spirometer which he has minimally been using.  Does note a tiny bit of wheezing at times prior to today though nothing significant.  He is purposely coughing to help though no other cough.  No fevers.  No numbness or weakness.  No shortness of breath.  No headache.  He does note his back pain is better today and is more in his left thoracic spine area.  His wife notes that he is favoring the left side and feels his left shoulder is slightly lower.  He notes no shoulder pain.  He has been taking Flexeril and hydrocodone and notes that does make him drowsy and unsteady on his feet.  He fell again on Monday onto his right side and hurt his back very mildly though had no head injury or loss of consciousness.  The patient's wife wonders how we know that he does not have any internal bleeding given he is on Coumadin.  I discussed with her that he would have other signs or symptoms such as abdominal discomfort or increasing discomfort otherwise.  His exam is reassuring as well.  His vital signs have been stable as well making it unlikely.  His imaging was also reassuring.  He does report chronic issues with constipation.  Last bowel  movement was 4-5 days ago.  Is passing some gas.  Typically takes fiber supplementation and this is beneficial though has not taken this recently.  Of note the patient was very cooperative and friendly throughout the visit.  The patient's wife voiced some displeasure with what occurred yesterday and has been documented in a telephone note from yesterday.  She did not make eye contact during the visit and was very short and curt with her answers.  Social History   Tobacco Use  Smoking Status Former Smoker  Smokeless Tobacco Never Used     ROS see history of present illness  Objective  Physical Exam Vitals:   07/21/17 1132  BP: 140/86  Pulse: 76  Temp: 98.5 F (36.9 C)  SpO2: 98%    BP Readings from Last 3 Encounters:  07/21/17 140/86  07/16/17 132/82  06/18/17 (!) 156/106   Wt Readings from Last 3 Encounters:  07/21/17 190 lb 6.4 oz (86.4 kg)  06/18/17 182 lb 12.8 oz (82.9 kg)  04/22/17 183 lb (83 kg)    Physical Exam  Constitutional: No distress.  Cardiovascular: Normal rate, regular rhythm and normal heart sounds.  Pulmonary/Chest: Effort normal and breath sounds normal.  Abdominal: Soft. Bowel sounds are normal. He exhibits no distension. There is no tenderness. There is no rebound and no guarding.  Musculoskeletal: He exhibits no edema.  Midline spine tenderness,  no midline spine step-off, there is mild thoracic muscular back tenderness and slight rib tenderness, no overlying bruising, full range of motion bilateral shoulders with discomfort, no palpable shoulder tenderness or defects felt  Neurological: He is alert. Gait normal.  5/5 strength in bilateral biceps, triceps, grip, quads, hamstrings, plantar and dorsiflexion, sensation to light touch intact in bilateral UE and LE  Skin: Skin is warm and dry. He is not diaphoretic.     Assessment/Plan: Please see individual problem list.  Fall Patient with mechanical fall from a computer chair.  Injury to back is  likely soft tissue related.  Suspect strain of intercostal muscles and back muscles.  Imaging in the ED was unremarkable for acute issues and the reports have been reviewed.  The note from the ED physician was reviewed as well.  I discussed that it would likely take some time for him to completely improve given the lack of rest that he can give his ribs.  Encourage incentive spirometer use.  Given prior wheezing I discussed doing a chest x-ray to repeat and evaluate for any underlying infectious cause though the patient declined this currently given lack of other symptoms.  If he develops new symptoms he will let us know we can complete a chest x-ray.  Discussed discontinuing the Flexeril given the excessive drowsiness.  He can take if needed if the hydrocodone has not taken care of his pain.  He will come off of the hydrocodone as needed.  Database was reviewed for the refill of this.  He has been improving over the last day or so.  Discussed monitoring his symptoms.  If he does not continue to improve with regards to his pain he will let us know.  I advised him not to climb any ladders or get on any chairs anymore.  He was given return precautions.  Constipation Suspect slightly worsened by hydrocodone.  Encouraged fiber supplementation.  They can add MiraLAX over-the-counter if not improving.  Encounter for current long-term use of anticoagulants Given patient's recent repeated falls while on Coumadin we will check a CBC to ensure he has not dropped his hemoglobin to indicate bleeding from an unknown source.   Orders Placed This Encounter  Procedures  . CBC    Meds ordered this encounter  Medications  . HYDROcodone-acetaminophen (NORCO) 5-325 MG tablet    Sig: Take 1 tablet by mouth every 6 (six) hours as needed for severe pain.    Dispense:  15 tablet    Refill:  0     William Rumps, MD [place]

## 2017-07-21 NOTE — Patient Instructions (Signed)
Nice to see you.  Please continue to monitor your pain. If it does not continue to improve please contact us. You can take the hydrocodone for this.  Please take Metamucil to help with the constipation. Please continue to use the incentive spirometer. Please try to limit your Flexeril use as this may make you excessively drowsy in combination with hydrocodone. Do not get on any chairs or climb any ladders again. If you develop cough or fever please contact us.  If you develop shortness of breath please get looked at immediately.

## 2017-07-24 DIAGNOSIS — Z7901 Long term (current) use of anticoagulants: Secondary | ICD-10-CM | POA: Insufficient documentation

## 2017-07-24 NOTE — Assessment & Plan Note (Addendum)
Given patient's recent repeated falls while on Coumadin we will check a CBC to ensure he has not dropped his hemoglobin to indicate bleeding from an unknown source.

## 2017-08-03 ENCOUNTER — Telehealth: Payer: Self-pay

## 2017-08-03 NOTE — Telephone Encounter (Signed)
Copied from [place] [id number]. Topic: Appointment Scheduling - Prior Auth Required for Appointment >> Aug 03, 2017 10:18 AM Ahmed Prima L wrote: No appointment has been scheduled. Patient is requesting a INR appointment. Per scheduling protocol, this appointment requires a prior authorization prior to scheduling.  Route to department's PEC pool.  Please call pt and sch @ [phone]

## 2017-08-03 NOTE — Telephone Encounter (Signed)
Patient scheduled for lab 

## 2017-08-03 NOTE — Telephone Encounter (Signed)
Copied from [place] [phone]. Topic: Appointment Scheduling - Prior Auth Required for Appointment >> Aug 03, 2017 10:18 AM Ahmed Prima L wrote: No appointment has been scheduled. Patient is requesting a INR appointment. Per scheduling protocol, this appointment requires a prior authorization prior to scheduling.  Route to department's PEC pool.  Please call pt and sch @ [phone]

## 2017-08-04 ENCOUNTER — Telehealth: Payer: Self-pay

## 2017-08-04 ENCOUNTER — Telehealth: Payer: Self-pay | Admitting: Family Medicine

## 2017-08-04 ENCOUNTER — Ambulatory Visit: Payer: PPO | Attending: Neurology

## 2017-08-04 DIAGNOSIS — F5101 Primary insomnia: Secondary | ICD-10-CM | POA: Diagnosis not present

## 2017-08-04 DIAGNOSIS — G4710 Hypersomnia, unspecified: Secondary | ICD-10-CM | POA: Diagnosis not present

## 2017-08-04 DIAGNOSIS — G4733 Obstructive sleep apnea (adult) (pediatric): Secondary | ICD-10-CM | POA: Insufficient documentation

## 2017-08-04 NOTE — Telephone Encounter (Signed)
Copied from [place]. Topic: Quick Communication - See Telephone Encounter >> Aug 04, 2017  2:50 PM [name], [place] wrote: CRM for notification. See Telephone encounter for:   08/04/17.pt called for [name] about his repatha pt wanted to know if it has been approved Please contact pt

## 2017-08-04 NOTE — Telephone Encounter (Signed)
Has this been approved?

## 2017-08-04 NOTE — Telephone Encounter (Signed)
Copied from [place]. Topic: Quick Communication - See Telephone Encounter >> Aug 04, 2017  2:50 PM Lolita Rieger, [place] wrote: CRM for notification. See Telephone encounter for:   08/04/17.pt called for Deirdre Pippins about his repatha pt wanted to know if it has been approved Please contact pt

## 2017-08-04 NOTE — Telephone Encounter (Signed)
I just called them, ironically! Patient's application has been in "benefits investigation phase" since 07/09/2017. I spoke with the supervisor and he states that he will escalate the application today and make sure the case is assigned a person. I continue to check in weekly.   Carlean Jews, Pharm.D., BCPS PGY2 Ambulatory Care Pharmacy Resident Phone: [phone]

## 2017-08-06 DIAGNOSIS — C44619 Basal cell carcinoma of skin of left upper limb, including shoulder: Secondary | ICD-10-CM | POA: Diagnosis not present

## 2017-08-06 DIAGNOSIS — Z08 Encounter for follow-up examination after completed treatment for malignant neoplasm: Secondary | ICD-10-CM | POA: Diagnosis not present

## 2017-08-06 DIAGNOSIS — L570 Actinic keratosis: Secondary | ICD-10-CM | POA: Diagnosis not present

## 2017-08-06 DIAGNOSIS — Z85820 Personal history of malignant melanoma of skin: Secondary | ICD-10-CM | POA: Diagnosis not present

## 2017-08-06 DIAGNOSIS — Z85828 Personal history of other malignant neoplasm of skin: Secondary | ICD-10-CM | POA: Diagnosis not present

## 2017-08-06 DIAGNOSIS — X32XXXA Exposure to sunlight, initial encounter: Secondary | ICD-10-CM | POA: Diagnosis not present

## 2017-08-06 DIAGNOSIS — D485 Neoplasm of uncertain behavior of skin: Secondary | ICD-10-CM | POA: Diagnosis not present

## 2017-08-09 NOTE — Telephone Encounter (Signed)
Pt is calling for an update, he would also like to know in the mean time does the office have any samples.  Please f/u with pt.

## 2017-08-10 NOTE — Telephone Encounter (Signed)
Patient states his current dose is 140 mg

## 2017-08-10 NOTE — Telephone Encounter (Signed)
Called [employer] to follow up on approval for patient assistance program. They report that they still need a prior authorization with his insurance company. Re-faxed them a copy of the prior auth approval I obtained before ever submitting the application. Prior auth approved from 06/21/17 through 06/07/18. Will follow up next week.

## 2017-08-10 NOTE — Telephone Encounter (Signed)
[name] has this been approved ?  Dr.Sonnneberg, please advise. We do have samples.

## 2017-08-10 NOTE — Telephone Encounter (Signed)
It is ok to provide with a sample though we will need to confirm his current dosing first. Please check on this with the patient. Thanks.

## 2017-08-11 NOTE — Telephone Encounter (Signed)
Please confirm dose of Repatha that we have samples of. If it is the same dose it is ok to prepare a sample for the patient and let me sign off on it.  Thanks.

## 2017-08-12 ENCOUNTER — Other Ambulatory Visit (INDEPENDENT_AMBULATORY_CARE_PROVIDER_SITE_OTHER): Payer: PPO

## 2017-08-12 ENCOUNTER — Ambulatory Visit: Payer: Self-pay | Admitting: Neurology

## 2017-08-12 DIAGNOSIS — Z5181 Encounter for therapeutic drug level monitoring: Secondary | ICD-10-CM

## 2017-08-12 LAB — PROTIME-INR
INR: 3.5 ratio — ABNORMAL HIGH (ref 0.8–1.0)
Prothrombin Time: 37.5 s — ABNORMAL HIGH (ref 9.6–13.1)

## 2017-08-12 MED ORDER — EVOLOCUMAB 140 MG/ML ~~LOC~~ SOAJ: 140.0000 mg | SUBCUTANEOUS | 0 refills | Status: DC

## 2017-08-12 NOTE — Telephone Encounter (Signed)
Sample given to patient.

## 2017-08-12 NOTE — Telephone Encounter (Signed)
Same dose 140 mg/ml

## 2017-08-12 NOTE — Telephone Encounter (Signed)
Sample created and ordered in chart. Labeled appropriately. Documented in log book.

## 2017-08-16 ENCOUNTER — Other Ambulatory Visit: Payer: Self-pay | Admitting: Family Medicine

## 2017-08-18 ENCOUNTER — Other Ambulatory Visit: Payer: Self-pay

## 2017-08-18 MED ORDER — WARFARIN SODIUM 1 MG PO TABS: ORAL_TABLET | ORAL | 1 refills | Status: DC

## 2017-08-24 ENCOUNTER — Telehealth: Payer: Self-pay | Admitting: Pharmacist

## 2017-08-24 NOTE — Telephone Encounter (Signed)
Patient approved for Repatha patient assistance via [employer] effective 08/11/17 through 06/07/18. Meds delivered to patient home 08/19/2017.   Called patient to verify receipt of medications. He confirms and verbalizes understanding of approval time length and dosing instructions.   Carlean Jews, Pharm.D., BCPS PGY2 Ambulatory Care Pharmacy Resident Phone: [phone]

## 2017-08-26 ENCOUNTER — Other Ambulatory Visit (INDEPENDENT_AMBULATORY_CARE_PROVIDER_SITE_OTHER): Payer: PPO

## 2017-08-26 DIAGNOSIS — Z5181 Encounter for therapeutic drug level monitoring: Secondary | ICD-10-CM

## 2017-08-26 LAB — PROTIME-INR
INR: 1.8 ratio — AB (ref 0.8–1.0)
PROTHROMBIN TIME: 19.7 s — AB (ref 9.6–13.1)

## 2017-09-02 DIAGNOSIS — F411 Generalized anxiety disorder: Secondary | ICD-10-CM | POA: Diagnosis not present

## 2017-09-02 DIAGNOSIS — F330 Major depressive disorder, recurrent, mild: Secondary | ICD-10-CM | POA: Diagnosis not present

## 2017-09-08 ENCOUNTER — Other Ambulatory Visit: Payer: Self-pay | Admitting: Family Medicine

## 2017-09-09 ENCOUNTER — Other Ambulatory Visit: Payer: PPO

## 2017-09-13 ENCOUNTER — Other Ambulatory Visit (INDEPENDENT_AMBULATORY_CARE_PROVIDER_SITE_OTHER): Payer: PPO

## 2017-09-13 DIAGNOSIS — Z5181 Encounter for therapeutic drug level monitoring: Secondary | ICD-10-CM

## 2017-09-13 LAB — PROTIME-INR
INR: 2.9 ratio — AB (ref 0.8–1.0)
PROTHROMBIN TIME: 31.2 s — AB (ref 9.6–13.1)

## 2017-09-15 ENCOUNTER — Ambulatory Visit: Payer: PPO | Attending: Neurology

## 2017-09-15 DIAGNOSIS — G4733 Obstructive sleep apnea (adult) (pediatric): Secondary | ICD-10-CM | POA: Insufficient documentation

## 2017-09-15 DIAGNOSIS — F5101 Primary insomnia: Secondary | ICD-10-CM | POA: Diagnosis not present

## 2017-09-16 ENCOUNTER — Ambulatory Visit (INDEPENDENT_AMBULATORY_CARE_PROVIDER_SITE_OTHER): Payer: PPO | Admitting: Family Medicine

## 2017-09-16 ENCOUNTER — Other Ambulatory Visit: Payer: Self-pay

## 2017-09-16 ENCOUNTER — Encounter: Payer: Self-pay | Admitting: Family Medicine

## 2017-09-16 VITALS — BP 139/89 | HR 75 | Temp 98.3°F | Ht 73.0 in | Wt 181.8 lb

## 2017-09-16 DIAGNOSIS — F32A Depression, unspecified: Secondary | ICD-10-CM

## 2017-09-16 DIAGNOSIS — F419 Anxiety disorder, unspecified: Secondary | ICD-10-CM | POA: Diagnosis not present

## 2017-09-16 DIAGNOSIS — K219 Gastro-esophageal reflux disease without esophagitis: Secondary | ICD-10-CM

## 2017-09-16 DIAGNOSIS — G4730 Sleep apnea, unspecified: Secondary | ICD-10-CM | POA: Diagnosis not present

## 2017-09-16 DIAGNOSIS — E785 Hyperlipidemia, unspecified: Secondary | ICD-10-CM

## 2017-09-16 DIAGNOSIS — W19XXXD Unspecified fall, subsequent encounter: Secondary | ICD-10-CM

## 2017-09-16 DIAGNOSIS — F329 Major depressive disorder, single episode, unspecified: Secondary | ICD-10-CM | POA: Diagnosis not present

## 2017-09-16 DIAGNOSIS — I480 Paroxysmal atrial fibrillation: Secondary | ICD-10-CM

## 2017-09-16 DIAGNOSIS — R251 Tremor, unspecified: Secondary | ICD-10-CM | POA: Diagnosis not present

## 2017-09-16 MED ORDER — OMEPRAZOLE 20 MG PO CPDR: 20.0000 mg | DELAYED_RELEASE_CAPSULE | Freq: Every day | ORAL | 0 refills | Status: DC

## 2017-09-16 NOTE — Assessment & Plan Note (Signed)
Somewhat improved.  He will continue to see psychiatry.

## 2017-09-16 NOTE — Assessment & Plan Note (Signed)
Continue Repatha.  Return for fasting lab work.

## 2017-09-16 NOTE — Patient Instructions (Signed)
Nice to see you. We will try to track down your CPAP titration study and get your CPAP ordered. Please try the omeprazole for your reflux.  We will do this for a month and if your symptoms are not improving please contact us.  Once you finish the months worth please go back to the Zantac.

## 2017-09-16 NOTE — Assessment & Plan Note (Addendum)
Continues to have some symptoms.  In the past he and his wife have been hesitant for him to be on a PPI.  I discussed the potential risks of PPI versus the potential risk of untreated reflux and the benefits of PPI therapy.  We will trial omeprazole for 1 month.  He will then return to Zantac.  If symptoms are not improving he will let us know.

## 2017-09-16 NOTE — Assessment & Plan Note (Addendum)
No recurrence.  He has not gotten on any more chairs.

## 2017-09-16 NOTE — Progress Notes (Signed)
William Rumps, MD Phone: [phone]  [name] is a 72 y.o. male who presents today for f/u.  Recently had a sleep study.  Went for CPAP titration last night.  Found to have obstructive component as well as central apneas.  Also possibly restless leg and periodic limb movements.  It appears recommendation was to see if these things improved with CPAP use.  If not improving consider further evaluation.  Discussed that the central apneic issues could be from stroke or other neurologic dysfunction.  Tremor: Has not really noticed this recently.  He has yet to see neurology though does have an appointment in May.  They may be helpful regarding his central apnea as well.  He continues to follow with psychiatry monthly for his anxiety and depression.  Notes it is somewhat better.  Is on Xanax once daily.  Now only on Cymbalta.  They took him off escitalopram.  No SI.  He does note some GERD symptoms during the day if he lays down.  No symptoms at night.  Occurs most days.  No blood in stool or abdominal pain.  No food triggers.  He does take Zantac.  He has been on Repatha for his hyperlipidemia.  He has not had any muscle aches with this.  Has been easy to use.  Social History   Tobacco Use  Smoking Status Former Smoker  Smokeless Tobacco Never Used     ROS see history of present illness  Objective  Physical Exam Vitals:   09/16/17 0925  BP: 139/89  Pulse: 75  Temp: 98.3 F (36.8 C)  SpO2: 97%    BP Readings from Last 3 Encounters:  09/16/17 139/89  07/21/17 140/86  07/16/17 132/82   Wt Readings from Last 3 Encounters:  09/16/17 181 lb 12.8 oz (82.5 kg)  07/21/17 190 lb 6.4 oz (86.4 kg)  06/18/17 182 lb 12.8 oz (82.9 kg)    Physical Exam  Constitutional: No distress.  Cardiovascular: Normal rate, regular rhythm and normal heart sounds.  Pulmonary/Chest: Effort normal and breath sounds normal.  Abdominal: Soft. Bowel sounds are normal. He exhibits no  distension. There is no tenderness.  Diastases recti  Musculoskeletal: He exhibits no edema.  Neurological: He is alert.  No tremor noted  Skin: Skin is warm and dry. He is not diaphoretic.     Assessment/Plan: Please see individual problem list.  A-fib Sinus rhythm today.  He continues on Coumadin.  His INR has been up and down though he notes no significant dietary changes.  We will plan to recheck as scheduled.  GERD (gastroesophageal reflux disease) Continues to have some symptoms.  In the past he and his wife have been hesitant for him to be on a PPI.  I discussed the potential risks of PPI versus the potential risk of untreated reflux and the benefits of PPI therapy.  We will trial omeprazole for 1 month.  He will then return to Zantac.  If symptoms are not improving he will let us know.  Tremor None currently.  He will see neurology as planned.  Hyperlipidemia Continue Repatha.  Return for fasting lab work.  Fall No recurrence.  He has not gotten on any more chairs.  Anxiety and depression Somewhat improved.  He will continue to see psychiatry.  Sleep apnea Mixed obstructive and central.  It appears the recommendation was to see how he does with CPAP and then consider further evaluation for the other issues that were noted.  We will request his  titration study and get him set up with CPAP.   Orders Placed This Encounter  Procedures  . Lipid panel    Standing Status:   Future    Standing Expiration Date:   09/17/2018  . Comp Met (CMET)    Standing Status:   Future    Standing Expiration Date:   09/17/2018    Meds ordered this encounter  Medications  . omeprazole (PRILOSEC) 20 MG capsule    Sig: Take 1 capsule (20 mg total) by mouth daily.    Dispense:  30 capsule    Refill:  0     William Rumps, MD [place]

## 2017-09-16 NOTE — Assessment & Plan Note (Signed)
Mixed obstructive and central.  It appears the recommendation was to see how he does with CPAP and then consider further evaluation for the other issues that were noted.  We will request his titration study and get him set up with CPAP.

## 2017-09-16 NOTE — Assessment & Plan Note (Addendum)
None currently.  He will see neurology as planned.

## 2017-09-16 NOTE — Assessment & Plan Note (Signed)
Sinus rhythm today.  He continues on Coumadin.  His INR has been up and down though he notes no significant dietary changes.  We will plan to recheck as scheduled.

## 2017-09-17 DIAGNOSIS — C44619 Basal cell carcinoma of skin of left upper limb, including shoulder: Secondary | ICD-10-CM | POA: Diagnosis not present

## 2017-09-17 DIAGNOSIS — L905 Scar conditions and fibrosis of skin: Secondary | ICD-10-CM | POA: Diagnosis not present

## 2017-09-30 ENCOUNTER — Other Ambulatory Visit: Payer: PPO

## 2017-09-30 ENCOUNTER — Other Ambulatory Visit (INDEPENDENT_AMBULATORY_CARE_PROVIDER_SITE_OTHER): Payer: PPO

## 2017-09-30 DIAGNOSIS — Z5181 Encounter for therapeutic drug level monitoring: Secondary | ICD-10-CM

## 2017-09-30 DIAGNOSIS — E785 Hyperlipidemia, unspecified: Secondary | ICD-10-CM

## 2017-09-30 LAB — COMPREHENSIVE METABOLIC PANEL
ALBUMIN: 3.9 g/dL (ref 3.5–5.2)
ALK PHOS: 74 U/L (ref 39–117)
ALT: 16 U/L (ref 0–53)
AST: 20 U/L (ref 0–37)
BILIRUBIN TOTAL: 0.5 mg/dL (ref 0.2–1.2)
BUN: 13 mg/dL (ref 6–23)
CHLORIDE: 106 meq/L (ref 96–112)
CO2: 31 mEq/L (ref 19–32)
CREATININE: 1.01 mg/dL (ref 0.40–1.50)
Calcium: 9.4 mg/dL (ref 8.4–10.5)
GFR: 77.27 mL/min (ref >60.00)
Glucose, Bld: 122 mg/dL — ABNORMAL HIGH (ref 70–99)
Potassium: 4.3 mEq/L (ref 3.5–5.1)
Sodium: 140 mEq/L (ref 135–145)
Total Protein: 7 g/dL (ref 6.0–8.3)

## 2017-09-30 LAB — PROTIME-INR
INR: 2.7 ratio — ABNORMAL HIGH (ref 0.8–1.0)
PROTHROMBIN TIME: 31.5 s — AB (ref 9.6–13.1)

## 2017-09-30 LAB — LIPID PANEL
Cholesterol: 152 mg/dL (ref 0–200)
HDL: 62.5 mg/dL (ref >39.00)
LDL Cholesterol: 58 mg/dL (ref 0–99)
NonHDL: 89.93
TRIGLYCERIDES: 162 mg/dL — AB (ref 0.0–149.0)
Total CHOL/HDL Ratio: 2
VLDL: 32.4 mg/dL (ref 0.0–40.0)

## 2017-10-02 ENCOUNTER — Other Ambulatory Visit: Payer: Self-pay | Admitting: Family Medicine

## 2017-10-05 NOTE — Telephone Encounter (Signed)
scheduled

## 2017-10-07 ENCOUNTER — Ambulatory Visit (INDEPENDENT_AMBULATORY_CARE_PROVIDER_SITE_OTHER): Payer: PPO | Admitting: Neurology

## 2017-10-07 ENCOUNTER — Telehealth: Payer: Self-pay | Admitting: Family Medicine

## 2017-10-07 ENCOUNTER — Encounter: Payer: Self-pay | Admitting: Neurology

## 2017-10-07 VITALS — BP 143/87 | HR 70 | Ht 73.0 in | Wt 183.0 lb

## 2017-10-07 DIAGNOSIS — R251 Tremor, unspecified: Secondary | ICD-10-CM

## 2017-10-07 DIAGNOSIS — Z8673 Personal history of transient ischemic attack (TIA), and cerebral infarction without residual deficits: Secondary | ICD-10-CM | POA: Diagnosis not present

## 2017-10-07 NOTE — Telephone Encounter (Signed)
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

## 2017-10-07 NOTE — Telephone Encounter (Signed)
Please advise 

## 2017-10-07 NOTE — Progress Notes (Signed)
Subjective:    Patient ID: [name] is a 72 y.o. male.  HPI     Star Age, MD, PhD [facility] [address] [place], [place] [address]  Dear Dr. Caryl Bis,   I saw your patient, [name], upon your kind request in my neurologic clinic today for initial consultation of his tremors. The patient is accompanied by his wife today. As you know, [name] is a 72 year old right-handed gentleman with an underlying medical history of hypertension, hyperlipidemia, reflux disease, sleep apnea, depression, coronary artery disease, BPH, A. fib, prior history of stroke, who reports an intermittent right hand tremor for the past year or so. I reviewed your office note from 09/16/2017.  Tremor is not very bothersome. He does not have a family history of tremors or Parkinson's disease. He reports that he had 2 strokes in the past, reports residual weakness on the left, has a cane but does not always use it. He had recent sleep study testing and is supposed to start PAP therapy.  He had a head CT without contrast on 07/16/2017 and I reviewed the results: IMPRESSION: No CT evidence for acute intracranial abnormality. Atrophy and mild small vessel ischemic changes of the white matter.   He states that he had a stroke some 12 years ago. He also has an intermittent tremor in the right hand. He is retired, he lives with his wife, they have 1 child. He quit smoking some 12 years ago, drinks alcohol in the form of wine, about 2 glasses per day on average and caffeine in the form of coffee and tea and sodas, he does not like to drink water. He does not do much in the way of formal exercise but walks their dogs.  Patient brought in records from his previous doctors. He was previously evaluated with a neuropsychological evaluation in 2007 with motor and cognitive deficits consistent with prior stroke. He was diagnosed with right basal ganglia lacunar  stroke in 2006. He was diagnosed with A. fib in 2006 and placed on Coumadin.  His Past Medical History Is Significant For: Past Medical History:  Diagnosis Date  . A-fib ([place])   . BPH (benign prostatic hyperplasia)   . Coronary artery disease    Hx of MI; S/P PCI  . Depression   . GERD (gastroesophageal reflux disease)   . Hyperlipidemia   . Hypertension   . IBS (irritable bowel syndrome)   . IBS (irritable bowel syndrome)   . PNA (pneumonia)   . Skin cancer   . Stroke [facility])     His Past Surgical History Is Significant For: Past Surgical History:  Procedure Laterality Date  . ADENOIDECTOMY  1951  . CARDIAC CATHETERIZATION  2006   X1 STENT  . SKIN CANCER EXCISION N/A 08/2016   Forehead.  [facility] (Dr. Deliah Boston)  . THYROIDECTOMY, PARTIAL      His Family History Is Significant For: Family History  Problem Relation Age of Onset  . Heart Problems Father   . Heart disease Father   . Hypertension Father   . Mental illness Mother   . Arthritis Mother   . Cancer Mother        ovarian  . Stroke Paternal Uncle   . Heart disease Paternal Grandfather   . Cancer Maternal Aunt   . Stroke Maternal Grandmother   . Sudden death Maternal Grandfather   . Heart disease Paternal Grandmother     His Social History  Is Significant For: Social History   Socioeconomic History  . Marital status: Married    Spouse name: Not on file  . Number of children: Not on file  . Years of education: Not on file  . Highest education level: Not on file  Occupational History  . Not on file  Social Needs  . Financial resource strain: Not on file  . Food insecurity:    Worry: Not on file    Inability: Not on file  . Transportation needs:    Medical: Not on file    Non-medical: Not on file  Tobacco Use  . Smoking status: Former Research scientist (life sciences)  . Smokeless tobacco: Never Used  Substance and Sexual Activity  . Alcohol use: Yes    Alcohol/week: 1.2 - 1.8 oz    Types: 2 - 3 Glasses  of wine per week    Comment: Wine daily with dinner  . Drug use: No  . Sexual activity: Never  Lifestyle  . Physical activity:    Days per week: Not on file    Minutes per session: Not on file  . Stress: Not on file  Relationships  . Social connections:    Talks on phone: Not on file    Gets together: Not on file    Attends religious service: Not on file    Active member of club or organization: Not on file    Attends meetings of clubs or organizations: Not on file    Relationship status: Not on file  Other Topics Concern  . Not on file  Social History Narrative  . Not on file    His Allergies Are:  Allergies  Allergen Reactions  . Bactrim [Sulfamethoxazole-Trimethoprim] Anaphylaxis    Ulcers  . Crestor [Rosuvastatin Calcium]     Severe Myalgias  :   His Current Medications Are:  Outpatient Encounter Medications as of 10/07/2017  Medication Sig  . aspirin 81 MG tablet Take 81 mg by mouth daily.  . bisoprolol (ZEBETA) 5 MG tablet TAKE 1 TABLET (5 MG TOTAL) BY MOUTH DAILY.  Marland Kitchen Cholecalciferol (VITAMIN D) 2000 UNITS CAPS Take 2,000 Units by mouth 2 (two) times daily.   . Coenzyme Q10 (CO Q 10 PO) Take 1 tablet by mouth at bedtime.  . dicyclomine (BENTYL) 10 MG capsule TAKE 1 CAPSULE (10 MG TOTAL) BY MOUTH 2 (TWO) TIMES DAILY.  . DULoxetine (CYMBALTA) 20 MG capsule Take 40 mg by mouth daily.  . Evolocumab (REPATHA SURECLICK) 563 MG/ML SOAJ Inject 140 mg into the skin every 14 (fourteen) days.  . hydrochlorothiazide (HYDRODIURIL) 12.5 MG tablet Take 1 tablet (12.5 mg total) by mouth daily.  Marland Kitchen lisinopril (PRINIVIL,ZESTRIL) 20 MG tablet TAKE 1 TABLET BY MOUTH TWICE A DAY  . nitroGLYCERIN (NITROSTAT) 0.4 MG SL tablet Place 1 tablet (0.4 mg total) every 5 (five) minutes as needed under the tongue for chest pain.  Marland Kitchen omeprazole (PRILOSEC) 20 MG capsule Take 1 capsule (20 mg total) by mouth daily.  Marland Kitchen triamcinolone cream (KENALOG) 0.5 % Apply 1 application topically 2 (two) times daily  as needed. For rash  . vitamin B-12 (CYANOCOBALAMIN) 1000 MCG tablet Take 1,000 mcg by mouth daily.  Marland Kitchen warfarin (COUMADIN) 1 MG tablet Take 1 tablet (1 mg) by mouth each Monday with the warfarin 3.75 mg (1/2 tablet) that you take for total dose of 4.75 mg on Monday.  . warfarin (COUMADIN) 7.5 MG tablet Take half a tablet by mouth on Monday. Remainder of week 1 tablet daily.  . [  DISCONTINUED] cyclobenzaprine (FLEXERIL) 10 MG tablet Take 1 tablet (10 mg total) by mouth 3 (three) times daily as needed for muscle spasms.  . [DISCONTINUED] escitalopram (LEXAPRO) 20 MG tablet TAKE 1 TABLET (20 MG TOTAL) BY MOUTH DAILY.  . [DISCONTINUED] ranitidine (ZANTAC) 150 MG tablet TAKE 1 TABLET (150 MG TOTAL) BY MOUTH 2 (TWO) TIMES DAILY.   No facility-administered encounter medications on file as of 10/07/2017.   : Review of Systems:  Out of a complete 14 point review of systems, all are reviewed and negative with the exception of these symptoms as listed below: Review of Systems  Neurological:       Pt presents today to discuss his stroke. Pt has had 2 strokes and a heart attack. Pt reports an intermittent tremor in his right hand.    Objective:  Neurological Exam  Physical Exam Physical Examination:   Vitals:   10/07/17 1012  BP: (!) 143/87  Pulse: 70    General Examination: The patient is a very pleasant 72 y.o. male in no acute distress. He appears well-developed and well-nourished and well groomed.   HEENT: Normocephalic, atraumatic, pupils are equal, round and reactive to light and accommodation. Funduscopic exam is difficult, he has bilateral mild cataracts. Extraocular tracking is good, he has no facial weakness, he has no oral facial dyskinesias, no lip, neck or jaw tremor. Oropharynx examination revealed moderate mouth dryness, tongue protrudes centrally and palate elevates symmetrically.  Chest: Clear to auscultation without wheezing, rhonchi or crackles noted.  Heart: S1+S2+0, regular  and normal without murmurs, rubs or gallops noted.   Abdomen: Soft, non-tender and non-distended with normal bowel sounds appreciated on auscultation.  Extremities: There is no pitting edema in the distal lower extremities bilaterally. Pedal pulses are intact.  Skin: Warm and dry without trophic changes noted.  Musculoskeletal: exam reveals no obvious joint deformities, tenderness or joint swelling or erythema.   Neurologically:  Mental status: The patient is awake, alert and oriented in all 4 spheres. His immediate and remote memory, attention, language skills and fund of knowledge are appropriate. There is no evidence of aphasia, agnosia, apraxia or anomia. Speech is clear with normal prosody and enunciation. Thought process is linear. Mood is normal and affect is normal.  Cranial nerves II - XII are as described above under HEENT exam. In addition: shoulder shrug is normal with equal shoulder height noted. Motor exam: Normal bulk, strength and tone is noted. There is no drift, resting tremor or rebound.  On 10/07/2017: on Archimedes spiral drawing he has slight insecurity with the left more than right hand, no significant trembling. Handwriting is legible, not tremulous, not micrographic.  He has No postural or action tremor, no intention tremor.  Romberg is negative. Reflexes are 1-2+ throughout.  Fine motor skills with coordination: preserved in the upper and lower extremities. Slight slowness noticed with hand movements, finger taps and foot taps on the left, no decrement in amplitude.  Cerebellar testing: No dysmetria or intention tremor on finger to nose testing. Heel to shin is unremarkable bilaterally.  Sensory exam: intact to light touch.  Gait, station and balance: He standswithout difficulty, requires no assistance, he walks with preserved arm swing but slight limp on the left. He did not bring his cane today.  Assessment and Plan:    In summary, Jsaon Yoo is a very  pleasant 72 y.o.-year old male with an underlying medical history of hypertension, hyperlipidemia, reflux disease, sleep apnea, depression, coronary artery disease, BPH, A. fib, prior  history of stroke, who presents for evaluation of his tremor. He does not currently have a obvious tremor, also no signs of parkinsonism, he is reassured in this regard. He reports some residual difficulty with fine motor control and walking affecting his left side, residual from his stroke from 2006. We talked about the importance of secondary stroke prevention and maintaining a  healthy lifestyle in general. I encouraged the patient to eat healthy, exercise daily and keep well hydrated. He is advised to increase his water intake and decrease his caffeine intake. He also will likely benefit from a more formal exercise regimen. He is encouraged to use his cane for gait safety. He is advised to address his mildly elevated blood sugar value recently as he reports that he had blood testing while fasting. I did not see a recent A1c in his labs. I did not suggest any symptomatic medication for his mild and intermittent tremor, he is agreeable.I suggested as needed follow-up. I answered all their questions today and the patient and his wife were in agreement. Thank you very much for allowing me to participate in the care of this nice patient. If I can be of any further assistance to you please do not hesitate to call me at [phone].  Sincerely,   Star Age, MD, PhD

## 2017-10-07 NOTE — Patient Instructions (Addendum)
Continue exercising regularly and take your medications as directed. As discussed, secondary prevention is key after a stroke. This means: taking care of blood sugar values or diabetes management (A1c goal of less than 7.0), good blood pressure (hypertension) control and optimizing cholesterol management (with LDL goal of less than 70), exercising daily or regularly within your own mobility limitations of course, and overall cardiovascular risk factor reduction, which includes screening for and treatment of obstructive sleep apnea (OSA) and weight management.  Please check with Dr. Caryl Bis regarding your mildly elevated blood sugar recently.  Please try to hydrate better with water.  Your tremor being mild and intermittent does not need symptomatic treatment.  I can see you back as needed.  Should you have any sudden onset of one-sided weakness, numbness, tingling, shortness of breath, chest pain, one-sided headache or severe headache, changes in mental state, please proceed to the ER or call 911 immediately.

## 2017-10-07 NOTE — Telephone Encounter (Signed)
fyi about sleep study

## 2017-10-07 NOTE — Telephone Encounter (Signed)
Copied from [id number]. Topic: Referral - Question >> Oct 07, 2017  1:17 PM Robina Ade, Helene Kelp D wrote: Reason for CRM: Patient called and would like to talk to someone about his referral to have a sleep study. He has questions about the status of it. Please call patient back, thanks.

## 2017-10-07 NOTE — Telephone Encounter (Signed)
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 He would like [place] to call him [phone]

## 2017-10-08 ENCOUNTER — Telehealth: Payer: Self-pay

## 2017-10-08 NOTE — Telephone Encounter (Signed)
This is not regarding the sleep study. It is regarding his equipment. He needs his DME order sent to [facility]. I will place the order form in your folder Dr. Caryl Bis along with the sleep study so you can fill out the order form.

## 2017-10-08 NOTE — Telephone Encounter (Signed)
Copied from [place] [id number]. Topic: Quick Communication - See Telephone Encounter >> Oct 08, 2017  2:54 PM Hewitt Shorts wrote: Pt is needing to talk with someone regarding sleep study  Best number [phone]

## 2017-10-11 NOTE — Telephone Encounter (Signed)
[facility]

## 2017-10-11 NOTE — Telephone Encounter (Signed)
Signed and given back to [facility].

## 2017-10-12 ENCOUNTER — Other Ambulatory Visit: Payer: Self-pay | Admitting: Family Medicine

## 2017-10-18 DIAGNOSIS — G4733 Obstructive sleep apnea (adult) (pediatric): Secondary | ICD-10-CM | POA: Diagnosis not present

## 2017-10-21 DIAGNOSIS — F330 Major depressive disorder, recurrent, mild: Secondary | ICD-10-CM | POA: Diagnosis not present

## 2017-10-21 DIAGNOSIS — F411 Generalized anxiety disorder: Secondary | ICD-10-CM | POA: Diagnosis not present

## 2017-10-25 ENCOUNTER — Ambulatory Visit (INDEPENDENT_AMBULATORY_CARE_PROVIDER_SITE_OTHER): Payer: PPO

## 2017-10-25 ENCOUNTER — Telehealth: Payer: Self-pay

## 2017-10-25 VITALS — BP 110/82 | HR 77 | Temp 97.8°F | Resp 15 | Ht 72.5 in | Wt 184.4 lb

## 2017-10-25 DIAGNOSIS — Z0000 Encounter for general adult medical examination without abnormal findings: Secondary | ICD-10-CM | POA: Diagnosis not present

## 2017-10-25 NOTE — Patient Instructions (Addendum)
  [name] , Thank you for taking time to come for your Medicare Wellness Visit. I appreciate your ongoing commitment to your health goals. Please review the following plan we discussed and let me know if I can assist you in the future.   These are the goals we discussed: Goals    . Maintain Healthy Lifestyle        This is a list of the screening recommended for you and due dates:  Health Maintenance  Topic Date Due  . Flu Shot  01/06/2018  . Tetanus Vaccine  07/07/2018  . Colon Cancer Screening  11/09/2022  .  Hepatitis C: One time screening is recommended by Center for Disease Control  (CDC) for  adults born from [birth year] through 1965.   Completed  . Pneumonia vaccines  Addressed

## 2017-10-25 NOTE — Telephone Encounter (Signed)
Please advise 

## 2017-10-25 NOTE — Progress Notes (Signed)
Subjective:   [name] is a 72 y.o. male who presents for Medicare Annual/Subsequent preventive examination.  Review of Systems:  No ROS.  Medicare Wellness Visit. Additional risk factors are reflected in the social history. Cardiac Risk Factors include: advanced age (>43men, >59 women);male gender;hypertension     Objective:    Vitals: BP 110/82 (BP Location: Right Arm, Patient Position: Sitting, Cuff Size: Normal)   Pulse 77   Temp 97.8 F (36.6 C) (Oral)   Resp 15   Ht 6' 0.5" (1.842 m)   Wt 184 lb 6.4 oz (83.6 kg)   SpO2 97%   BMI 24.67 kg/m   Body mass index is 24.67 kg/m.  Advanced Directives 10/25/2017 07/16/2017 10/22/2016 10/23/2015  Does Patient Have a Medical Advance Directive? Yes No Yes No  Type of Paramedic of [place];Living will - [place];Living will -  Does patient want to make changes to medical advance directive? No - Patient declined - No - Patient declined -  Copy of [place] in Chart? Yes - Yes -  Would patient like information on creating a medical advance directive? - No - Patient declined - Yes - Educational materials given    Tobacco Social History   Tobacco Use  Smoking Status Former Smoker  Smokeless Tobacco Never Used     Counseling given: Not Answered   Clinical Intake:  Pre-visit preparation completed: Yes  Pain : No/denies pain     Nutritional Status: BMI of 19-24  Normal Diabetes: No  How often do you need to have someone help you when you read instructions, pamphlets, or other written materials from your doctor or pharmacy?: 1 - Never  Interpreter Needed?: No     Past Medical History:  Diagnosis Date  . A-fib ([place])   . BPH (benign prostatic hyperplasia)   . Coronary artery disease    Hx of MI; S/P PCI  . Depression   . GERD (gastroesophageal reflux disease)   . Hyperlipidemia   . Hypertension   . IBS (irritable bowel syndrome)   . IBS  (irritable bowel syndrome)   . PNA (pneumonia)   . Skin cancer   . Stroke [facility])    Past Surgical History:  Procedure Laterality Date  . ADENOIDECTOMY  1951  . CARDIAC CATHETERIZATION  2006   X1 STENT  . SKIN CANCER EXCISION N/A 08/2016   Forehead.  [facility] (Dr. Deliah Boston)  . THYROIDECTOMY, PARTIAL     Family History  Problem Relation Age of Onset  . Heart Problems Father   . Heart disease Father   . Hypertension Father   . Mental illness Mother   . Arthritis Mother   . Cancer Mother        ovarian  . Stroke Paternal Uncle   . Heart disease Paternal Grandfather   . Cancer Maternal Aunt   . Stroke Maternal Grandmother   . Sudden death Maternal Grandfather   . Heart disease Paternal Grandmother    Social History   Socioeconomic History  . Marital status: Married    Spouse name: Not on file  . Number of children: Not on file  . Years of education: Not on file  . Highest education level: Not on file  Occupational History  . Not on file  Social Needs  . Financial resource strain: Not hard at all  . Food insecurity:    Worry: Never true    Inability: Never true  .  Transportation needs:    Medical: No    Non-medical: No  Tobacco Use  . Smoking status: Former Research scientist (life sciences)  . Smokeless tobacco: Never Used  Substance and Sexual Activity  . Alcohol use: Yes    Alcohol/week: 1.2 - 1.8 oz    Types: 2 - 3 Glasses of wine per week    Comment: Wine daily with dinner  . Drug use: No  . Sexual activity: Never  Lifestyle  . Physical activity:    Days per week: Not on file    Minutes per session: Not on file  . Stress: Not on file  Relationships  . Social connections:    Talks on phone: Not on file    Gets together: Not on file    Attends religious service: Not on file    Active member of club or organization: Not on file    Attends meetings of clubs or organizations: Not on file    Relationship status: Not on file  Other Topics Concern  . Not on file    Social History Narrative  . Not on file    Outpatient Encounter Medications as of 10/25/2017  Medication Sig  . aspirin 81 MG tablet Take 81 mg by mouth daily.  . bisoprolol (ZEBETA) 5 MG tablet TAKE 1 TABLET (5 MG TOTAL) BY MOUTH DAILY.  Marland Kitchen Cholecalciferol (VITAMIN D) 2000 UNITS CAPS Take 2,000 Units by mouth 2 (two) times daily.   . Coenzyme Q10 (CO Q 10 PO) Take 1 tablet by mouth at bedtime.  . dicyclomine (BENTYL) 10 MG capsule TAKE 1 CAPSULE (10 MG TOTAL) BY MOUTH 2 (TWO) TIMES DAILY.  . DULoxetine (CYMBALTA) 20 MG capsule Take 40 mg by mouth daily.  . Evolocumab (REPATHA SURECLICK) 161 MG/ML SOAJ Inject 140 mg into the skin every 14 (fourteen) days.  . hydrochlorothiazide (HYDRODIURIL) 12.5 MG tablet Take 1 tablet (12.5 mg total) by mouth daily.  Marland Kitchen lisinopril (PRINIVIL,ZESTRIL) 20 MG tablet TAKE 1 TABLET BY MOUTH TWICE A DAY  . nitroGLYCERIN (NITROSTAT) 0.4 MG SL tablet Place 1 tablet (0.4 mg total) every 5 (five) minutes as needed under the tongue for chest pain.  Marland Kitchen omeprazole (PRILOSEC) 20 MG capsule TAKE 1 CAPSULE BY MOUTH EVERY DAY  . triamcinolone cream (KENALOG) 0.5 % Apply 1 application topically 2 (two) times daily as needed. For rash  . vitamin B-12 (CYANOCOBALAMIN) 1000 MCG tablet Take 1,000 mcg by mouth daily.  Marland Kitchen warfarin (COUMADIN) 1 MG tablet Take 1 tablet (1 mg) by mouth each Monday with the warfarin 3.75 mg (1/2 tablet) that you take for total dose of 4.75 mg on Monday.  . warfarin (COUMADIN) 7.5 MG tablet Take half a tablet by mouth on Monday. Remainder of week 1 tablet daily.   No facility-administered encounter medications on file as of 10/25/2017.     Activities of Daily Living In your present state of health, do you have any difficulty performing the following activities: 10/25/2017  Hearing? N  Vision? N  Difficulty concentrating or making decisions? Y  Walking or climbing stairs? Y  Dressing or bathing? N  Doing errands, shopping? N  Preparing Food and  eating ? N  Using the Toilet? Y  In the past six months, have you accidently leaked urine? N  Do you have problems with loss of bowel control? N  Managing your Medications? Y  Managing your Finances? Y  Housekeeping or managing your Housekeeping? Y  Some recent data might be hidden    Patient Care  Team: Leone Haven, MD as PCP - General (Family Medicine) Minna Merritts, MD as Consulting Physician (Cardiology)   Assessment:   This is a routine wellness examination for [place].  The goal of the wellness visit is to assist the patient how to close the gaps in care and create a preventative care plan for the patient.   The roster of all physicians providing medical care to patient is listed in the Snapshot section of the chart.  Taking calcium VIT D as appropriate/Osteoporosis risk reviewed.    Safety issues reviewed; Smoke and carbon monoxide detectors in the home. No firearms or firearms locked in a safe within the home. Wears seatbelts when driving or riding with others. No violence in the home.  They do not have excessive sun exposure.  Discussed the need for sun protection: hats, long sleeves and the use of sunscreen if there is significant sun exposure.  Patient is alert, normal appearance, oriented to person/place/and time.  Can read correct time from watch face. Displays appropriate judgement.  No new identified risk were noted.  No failures at ADL's or IADL's.    BMI- discussed the importance of a healthy diet, water intake and the benefits of aerobic exercise. Educational material provided.   24 hour diet recall: Regular diet  Dental- UTD.   Eye- Visual acuity not assessed per patient preference since they have regular follow up with the ophthalmologist.  Wears corrective lenses.  Sleep patterns- Sleeps 6-7 hours at night.  Wakes feeling fatigued. CPAP in use.    Health maintenance gaps- closed.  Exercise Activities and Dietary recommendations Current  Exercise Habits: Home exercise routine, Time (Minutes): 20, Frequency (Times/Week): 4, Weekly Exercise (Minutes/Week): 80, Intensity: Mild  Goals    . Maintain Healthy Lifestyle        Fall Risk Fall Risk  10/25/2017 10/22/2016 10/23/2015 02/08/2015  Falls in the past year? Yes Yes No No  Number falls in past yr: 1 2 or more - -  Injury with Fall? Yes No - -  Comment Fell when standing on a chair to change a light bulb.  He did seek medical attention and was bruised his torso.  All ok today.  - - -  Risk Factor Category  High Fall Risk - - -  Risk for fall due to : Impaired balance/gait Impaired balance/gait - -  Risk for fall due to: Comment - Slipped on rug.  Rug placemat guard now in place to prevent future falls.  - -  Follow up Education provided;Falls prevention discussed Falls prevention discussed - -   Depression Screen PHQ 2/9 Scores 10/25/2017 10/22/2016 10/23/2015 02/08/2015  PHQ - 2 Score - 1 0 0  PHQ- 9 Score - 7 - -  Exception Documentation Other- indicate reason in comment box - - -  Not completed (No Data) - - -    Cognitive Function MMSE - Mini Mental State Exam 10/25/2017 10/23/2015  Not completed: Refused -  Orientation to time - 5  Orientation to Place - 5  Registration - 3  Attention/ Calculation - 5  Recall - 0  Recall-comments - Hx of stroke.  Difficulty remembering.  Language- name 2 objects - 2  Language- repeat - 1  Language- follow 3 step command - 3  Language- read & follow direction - 1  Write a sentence - 1  Copy design - 1  Total score - 27     6CIT Screen 10/22/2016  What Year? 0 points  What month? 0 points  What time? 0 points  Count back from 20 0 points  Months in reverse 0 points  Repeat phrase 10 points  Total Score 10    Immunization History  Administered Date(s) Administered  . Influenza, High Dose Seasonal PF 03/05/2017  . Influenza,inj,Quad PF,6+ Mos 02/08/2015, 02/11/2016  . Influenza-Unspecified 03/22/2010, 03/10/2011,  02/23/2012, 02/28/2013, 03/15/2014  . Pneumococcal Conjugate-13 09/04/2014  . Pneumococcal Polysaccharide-23 07/07/2004, 07/01/2011, 02/07/2013  . Tdap 07/07/2008  . Zoster 02/12/2011   Screening Tests Health Maintenance  Topic Date Due  . INFLUENZA VACCINE  01/06/2018  . TETANUS/TDAP  07/07/2018  . COLONOSCOPY  11/09/2022  . Hepatitis C Screening  Completed  . PNA vac Low Risk Adult  Addressed       Plan:    End of life planning; Advance aging; Advanced directives discussed. Copy of current HCPOA/Living Will on file.    I have personally reviewed and noted the following in the patient's chart:   . Medical and social history . Use of alcohol, tobacco or illicit drugs  . Current medications and supplements . Functional ability and status . Nutritional status . Physical activity . Advanced directives . List of other physicians . Hospitalizations, surgeries, and ER visits in previous 12 months . Vitals . Screenings to include cognitive, depression, and falls . Referrals and appointments  In addition, I have reviewed and discussed with patient certain preventive protocols, quality metrics, and best practice recommendations. A written personalized care plan for preventive services as well as general preventive health recommendations were provided to patient.     Varney Biles, LPN  [id number]

## 2017-10-25 NOTE — Telephone Encounter (Signed)
I would suggest we have him see pulmonology for further evaluation and management of his CPAP and sleep apnea.  We can get him to see Dr. Ashby Dawes pretty quickly if he is willing to go see them.  Thanks.

## 2017-10-25 NOTE — Telephone Encounter (Signed)
Copied from [place] [id number]. Topic: General - Other >> Oct 25, 2017  1:19 PM Lennox Solders wrote: Reason for CRM: pt is calling [facility] needs a new rx for his cpap machine perhaps the setting needs to be adjusted. Pt is having to many apnea event and pt thinks he is not getting enough oxygen. [facility] phone [phone]

## 2017-10-26 NOTE — Telephone Encounter (Signed)
Patient states you are the one who sent him to the sleep study recently so he wants you to manage it. I informed patient we do not usually manage cpap machines. He states he was told that you would increase the airflow and all you need to do is give the orders.

## 2017-10-26 NOTE — Telephone Encounter (Signed)
I understand what he was told, though that is not correct. When we have issues with CPAPs not providing adequate treatment to patients I send them back for a titration study to determine if the settings need to be changed. I do not change individual settings without confirmation with from a CPAP titration study given that I do not have the training to do that. Given that he just had the CPAP titration study and is already having issues he needs to see a pulmonologist for evaluation to adjust his CPAP. I will forward to [facility] to call the patient regarding this. I would like to refer him to Dr Ashby Dawes who we can likely get the patient to see this week or next week.

## 2017-10-26 NOTE — Telephone Encounter (Signed)
Called patient to notify of PCP advice, patient ask nurse to speak with his wife, before I can verbalize PCP advice she advised that "we do not want to see anymore D.... specialist for an order to adjust CPAP, if that's what the h.... he wants just forget it. " Explained to patient that Dr. Caryl Bis did not feel comfortable with his level of expertise adjusting patient CPAP without pulmonology,patient said just for get it.

## 2017-10-26 NOTE — Telephone Encounter (Signed)
Pt called back to ask if the dr would just give authorization for [facility] to increase his air flow.  They advised pt is having more increased episodes of of sleep apnea. Pt states going to a pulmonologist seems like a big step when all he wants is authorization to increase the air flow.  Pt doesn't want to do that, to try this first and then if that doesn't work, to go to pulmonary.

## 2017-10-26 NOTE — Telephone Encounter (Signed)
Given that he has had increased episodes despite his CPAP use and fairly recent CPAP testing I would really like for him to see pulmonology to help manage his CPAP and sleep apnea as they are more equipped to manage sleep apnea particularly in a complex case.

## 2017-10-26 NOTE — Telephone Encounter (Signed)
Patient notified and would like referral he can go any day except Wednesday

## 2017-10-29 ENCOUNTER — Other Ambulatory Visit (INDEPENDENT_AMBULATORY_CARE_PROVIDER_SITE_OTHER): Payer: PPO

## 2017-10-29 ENCOUNTER — Telehealth: Payer: Self-pay | Admitting: Family Medicine

## 2017-10-29 DIAGNOSIS — Z5181 Encounter for therapeutic drug level monitoring: Secondary | ICD-10-CM | POA: Diagnosis not present

## 2017-10-29 LAB — PROTIME-INR
INR: 2.9 ratio — ABNORMAL HIGH (ref 0.8–1.0)
Prothrombin Time: 32.7 s — ABNORMAL HIGH (ref 9.6–13.1)

## 2017-10-29 NOTE — Telephone Encounter (Signed)
I am fine with this transfer. He is a nice patient. This may have been prompted by me wanting to refer him to pulmonology for management of his CPAP. He did not want to do this. Please see phone note documentation regarding that.

## 2017-10-29 NOTE — Telephone Encounter (Signed)
Best number [phone] Pt would like to transfer from dr Caryl Bis to [place] lives across the [address] from [place]

## 2017-10-30 NOTE — Telephone Encounter (Signed)
Noted. Please notify patient that I'm happy to accept him.  I agree with Dr. Caryl Bis that he'll need to see pulmonology for maintenance of his CPAP settings. Appreciate the info.

## 2017-11-02 NOTE — Telephone Encounter (Signed)
Appointment 6/22 pt aware Also canceled appointment pt had in [place] office pt aware

## 2017-11-05 ENCOUNTER — Ambulatory Visit (INDEPENDENT_AMBULATORY_CARE_PROVIDER_SITE_OTHER)
Admission: RE | Admit: 2017-11-05 | Discharge: 2017-11-05 | Disposition: A | Discharge status: Home / Self Care | Payer: PPO | Source: Ambulatory Visit | Attending: Family Medicine | Admitting: Family Medicine

## 2017-11-05 ENCOUNTER — Ambulatory Visit (INDEPENDENT_AMBULATORY_CARE_PROVIDER_SITE_OTHER): Payer: PPO | Admitting: Family Medicine

## 2017-11-05 ENCOUNTER — Encounter: Payer: Self-pay | Admitting: Family Medicine

## 2017-11-05 VITALS — BP 144/80 | HR 78 | Temp 97.6°F | Ht 72.5 in | Wt 185.0 lb

## 2017-11-05 DIAGNOSIS — M25531 Pain in right wrist: Secondary | ICD-10-CM | POA: Diagnosis not present

## 2017-11-05 DIAGNOSIS — M79641 Pain in right hand: Secondary | ICD-10-CM | POA: Diagnosis not present

## 2017-11-05 DIAGNOSIS — G4733 Obstructive sleep apnea (adult) (pediatric): Secondary | ICD-10-CM | POA: Diagnosis not present

## 2017-11-05 NOTE — Patient Instructions (Signed)
Let's xray the wrist and hand - we will contact you with a result   Use an ace bandage for comfort only if needed Use ice/cold compress 10 minutes at a time when you can  Also elevate the arm and hand   If symptoms worsen- please let me know

## 2017-11-05 NOTE — Progress Notes (Signed)
Subjective:    Patient ID: [name], male    DOB: [birth year], 72 y.o.   MRN: [id number]  HPI Here for R arm pain and swelling   Started about a week ago    No injury known  Has been working out in the yard- was trimming some shrubs  Memory is not the best -could have forgotten Pain is worse over dorsal wrist  Worse to extend wrist   It is not keeping him from doing anything   Hx of afib on warfarin  Lab Results  Component Value Date   INR 2.9 (H) 10/29/2017   INR 2.7 (H) 09/30/2017   INR 2.9 (H) 09/13/2017     Wt Readings from Last 3 Encounters:  11/05/17 185 lb (83.9 kg)  10/25/17 184 lb 6.4 oz (83.6 kg)  10/07/17 183 lb (83 kg)   BP Readings from Last 3 Encounters:  11/05/17 (!) 144/80  10/25/17 110/82  10/07/17 (!) 143/87   Pulse Readings from Last 3 Encounters:  11/05/17 78  10/25/17 77  10/07/17 70   Patient Active Problem List   Diagnosis Date Noted  . Wrist pain, acute, right 11/05/2017  . Sleep apnea 09/16/2017  . Encounter for current long-term use of anticoagulants 07/24/2017  . Fall 07/21/2017  . Constipation 07/21/2017  . Chronic fatigue 06/18/2017  . Tremor 06/18/2017  . Eczema 06/18/2017  . Testicular atrophy 04/23/2017  . Dyspnea on exertion 03/16/2017  . Erectile dysfunction 03/05/2017  . Preventative health care 02/11/2016  . Anxiety and depression 02/08/2015  . GERD (gastroesophageal reflux disease) 02/08/2015  . IBS (irritable bowel syndrome) 02/08/2015  . Essential hypertension 02/08/2015  . A-fib ([place]) 01/23/2015  . CAD (coronary artery disease) 01/23/2015  . Hyperlipidemia 01/23/2015  . History of CVA (cerebrovascular accident) 01/23/2015   Past Medical History:  Diagnosis Date  . A-fib ([place])   . BPH (benign prostatic hyperplasia)   . Coronary artery disease    Hx of MI; S/P PCI  . Depression   . GERD (gastroesophageal reflux disease)   . Hyperlipidemia   . Hypertension   . IBS (irritable bowel syndrome)   . IBS  (irritable bowel syndrome)   . PNA (pneumonia)   . Skin cancer   . Stroke [facility])    Past Surgical History:  Procedure Laterality Date  . ADENOIDECTOMY  1951  . CARDIAC CATHETERIZATION  2006   X1 STENT  . SKIN CANCER EXCISION N/A 08/2016   Forehead.  [facility] (Dr. Deliah Boston)  . THYROIDECTOMY, PARTIAL     Social History   Tobacco Use  . Smoking status: Former Research scientist (life sciences)  . Smokeless tobacco: Never Used  Substance Use Topics  . Alcohol use: Yes    Alcohol/week: 1.2 - 1.8 oz    Types: 2 - 3 Glasses of wine per week    Comment: Wine daily with dinner  . Drug use: No   Family History  Problem Relation Age of Onset  . Heart Problems Father   . Heart disease Father   . Hypertension Father   . Mental illness Mother   . Arthritis Mother   . Cancer Mother        ovarian  . Stroke Paternal Uncle   . Heart disease Paternal Grandfather   . Cancer Maternal Aunt   . Stroke Maternal Grandmother   . Sudden death Maternal Grandfather   . Heart disease Paternal Grandmother    Allergies  Allergen Reactions  . Bactrim [Sulfamethoxazole-Trimethoprim] Anaphylaxis  Ulcers  . Crestor [Rosuvastatin Calcium]     Severe Myalgias   Current Outpatient Medications on File Prior to Visit  Medication Sig Dispense Refill  . aspirin 81 MG tablet Take 81 mg by mouth daily.    . bisoprolol (ZEBETA) 5 MG tablet TAKE 1 TABLET (5 MG TOTAL) BY MOUTH DAILY. 90 tablet 3  . Cholecalciferol (VITAMIN D) 2000 UNITS CAPS Take 2,000 Units by mouth 2 (two) times daily.     . Coenzyme Q10 (CO Q 10 PO) Take 300 mg by mouth at bedtime.     . dicyclomine (BENTYL) 10 MG capsule TAKE 1 CAPSULE (10 MG TOTAL) BY MOUTH 2 (TWO) TIMES DAILY. 90 capsule 1  . DULoxetine (CYMBALTA) 20 MG capsule Take 40 mg by mouth daily.    . Evolocumab (REPATHA SURECLICK) 034 MG/ML SOAJ Inject 140 mg into the skin every 14 (fourteen) days. 2 pen 0  . hydrochlorothiazide (HYDRODIURIL) 12.5 MG tablet Take 1 tablet (12.5 mg  total) by mouth daily. 90 tablet 3  . lisinopril (PRINIVIL,ZESTRIL) 20 MG tablet TAKE 1 TABLET BY MOUTH TWICE A DAY 180 tablet 3  . nitroGLYCERIN (NITROSTAT) 0.4 MG SL tablet Place 1 tablet (0.4 mg total) every 5 (five) minutes as needed under the tongue for chest pain. 25 tablet 3  . triamcinolone cream (KENALOG) 0.5 % Apply 1 application topically 2 (two) times daily as needed. For rash 30 g 0  . vitamin B-12 (CYANOCOBALAMIN) 1000 MCG tablet Take 1,000 mcg by mouth daily.    Marland Kitchen warfarin (COUMADIN) 1 MG tablet Take 1 tablet (1 mg) by mouth each Monday with the warfarin 3.75 mg (1/2 tablet) that you take for total dose of 4.75 mg on Monday. (Patient taking differently: 7.5 mg daily with 1 mg Friday only) 12 tablet 1  . warfarin (COUMADIN) 7.5 MG tablet Take half a tablet by mouth on Monday. Remainder of week 1 tablet daily. 90 tablet 3   No current facility-administered medications on file prior to visit.      Review of Systems     Objective:   Physical Exam  Constitutional: He is oriented to person, place, and time. He appears well-developed and well-nourished. No distress.  Well appearing   HENT:  Head: Normocephalic and atraumatic.  Eyes: Pupils are equal, round, and reactive to light. Conjunctivae and EOM are normal.  Neck: Normal range of motion. Neck supple.  Cardiovascular: Normal rate and normal heart sounds.  Musculoskeletal: He exhibits edema and tenderness. He exhibits no deformity.  Right dorsal wrist is swollen -worse at distal radius  Very slightly tender over proximal thumb  Nl rom -no pain  Neg Finklestein test Nl pronation/supination   Bruising noted (resolving- worse over anterior wrist and distal forearm) No bony tenderness or step off  No crepitus   Lymphadenopathy:    He has no cervical adenopathy.  Neurological: He is alert and oriented to person, place, and time. He displays normal reflexes. No cranial nerve deficit. Coordination normal.  Skin: Skin is warm  and dry. No rash noted. No pallor.  Psychiatric: He has a normal mood and affect.  Pleasant           Assessment & Plan:   Problem List Items Addressed This Visit      Other   Wrist pain, acute, right - Primary    With old ecchymosis and very mild dorsal wrist tenderness  Some swelling  Suspect traumatic  Worse in light of anticoag status  Xray today  Light compression if helpful/ace /gentle  Ice/elevation  Alert with results Update if not starting to improve in a week or if worsening        Relevant Orders   DG Wrist Complete Right (Completed)   DG Hand Complete Right (Completed)

## 2017-11-07 NOTE — Assessment & Plan Note (Signed)
With old ecchymosis and very mild dorsal wrist tenderness  Some swelling  Suspect traumatic  Worse in light of anticoag status  Xray today  Light compression if helpful/ace /gentle  Ice/elevation  Alert with results Update if not starting to improve in a week or if worsening

## 2017-11-18 DIAGNOSIS — G4733 Obstructive sleep apnea (adult) (pediatric): Secondary | ICD-10-CM | POA: Diagnosis not present

## 2017-11-19 ENCOUNTER — Ambulatory Visit (INDEPENDENT_AMBULATORY_CARE_PROVIDER_SITE_OTHER): Payer: PPO

## 2017-11-19 ENCOUNTER — Ambulatory Visit (INDEPENDENT_AMBULATORY_CARE_PROVIDER_SITE_OTHER): Payer: PPO | Admitting: Primary Care

## 2017-11-19 ENCOUNTER — Encounter: Payer: Self-pay | Admitting: Primary Care

## 2017-11-19 DIAGNOSIS — Z8673 Personal history of transient ischemic attack (TIA), and cerebral infarction without residual deficits: Secondary | ICD-10-CM

## 2017-11-19 DIAGNOSIS — G4730 Sleep apnea, unspecified: Secondary | ICD-10-CM

## 2017-11-19 DIAGNOSIS — I10 Essential (primary) hypertension: Secondary | ICD-10-CM | POA: Diagnosis not present

## 2017-11-19 DIAGNOSIS — F329 Major depressive disorder, single episode, unspecified: Secondary | ICD-10-CM | POA: Diagnosis not present

## 2017-11-19 DIAGNOSIS — L309 Dermatitis, unspecified: Secondary | ICD-10-CM | POA: Diagnosis not present

## 2017-11-19 DIAGNOSIS — Z7901 Long term (current) use of anticoagulants: Secondary | ICD-10-CM | POA: Insufficient documentation

## 2017-11-19 DIAGNOSIS — I25118 Atherosclerotic heart disease of native coronary artery with other forms of angina pectoris: Secondary | ICD-10-CM

## 2017-11-19 DIAGNOSIS — I480 Paroxysmal atrial fibrillation: Secondary | ICD-10-CM | POA: Diagnosis not present

## 2017-11-19 DIAGNOSIS — F419 Anxiety disorder, unspecified: Secondary | ICD-10-CM | POA: Diagnosis not present

## 2017-11-19 DIAGNOSIS — K589 Irritable bowel syndrome without diarrhea: Secondary | ICD-10-CM | POA: Diagnosis not present

## 2017-11-19 DIAGNOSIS — E782 Mixed hyperlipidemia: Secondary | ICD-10-CM | POA: Diagnosis not present

## 2017-11-19 LAB — POCT INR: INR: 3.3 — AB (ref 2.0–3.0)

## 2017-11-19 NOTE — Assessment & Plan Note (Addendum)
MI in 2007 with PCI to left circumflex artery.  Continue aspirin, Repatha, BP control. Message sent to cardiologist regarding beta blocker use as he's no longer able to get his bisoprolol.   Myoview stress test in 2017 with normal pump function, EF of 59%.

## 2017-11-19 NOTE — Assessment & Plan Note (Signed)
Stable in the office today.  Continue to monitor. 

## 2017-11-19 NOTE — Assessment & Plan Note (Signed)
Intermittent to bilateral nasal bridges. Continue low potency triamcinolone PRN.

## 2017-11-19 NOTE — Patient Instructions (Addendum)
Call your Neurologist to see if they can adjust your CPAP settings. Tell them that the reports are in Epic.  Please stop by the front and ask for [facility] for coumadin clinic.  I'll be in touch once I hear back from Dr. Rockey Situ.  It was a pleasure to meet you today! Please don't hesitate to call or message me with any questions. Welcome to [facility]!

## 2017-11-19 NOTE — Assessment & Plan Note (Signed)
Using CPAP x 1 month, doesn't seem to notice much of a difference. Report received and will be scanned into chart. He is already connected to [place], he will call to see if they can titrate CPAP settings, if not then send to pulmonology.

## 2017-11-19 NOTE — Progress Notes (Signed)
Subjective:    Patient ID: [name], male    DOB: [birth year], 72 y.o.   MRN: [id number]  HPI  [name] is a 72 year old male who presents today to transfer care from [facility].  1) Essential Hypertension/History of Myocardial Infarction: Currently managed on bisoprolol 5 mg, HCTZ 12.5 mg, lisinopril 20 mg. No recent use of nitroglycerin.   He's having trouble getting his bisoprolol medication as most pharmacies don't have it in [place]. He ran out of the bisoprolol 1-2 weeks ago, did not notify anyone. He's checking his BP at home which is running 110's/80's. History of MI 12 years ago, did undergo PCI to left circumflex artery at that time.   BP Readings from Last 3 Encounters:  11/19/17 116/74  11/05/17 (!) 144/80  10/25/17 110/82     2) Atrial Fibrillation: Currently managed on Coumadin and is followed by [facility]. He is also managed on bisoprolol 5 mg.  3) Hyperlipidemia/History of CVA: CVA 12 years ago. Lipid panel in April with LDL of 58. Currently managed on Repatha and following with Dr. Rockey Situ.   4) IBS: Diarrhea type originally, now more constipation since initiation of Repatha. Currently managed on dicyclomine 10 mg for which he takes everyday, twice daily before meals.   5) Anxiety and Depression: Currently managed on Cymbalta 20 mg and is following with psychiatry. Overall feels well managed.   6) Eczema: Currently managed on triamcinolone 0.1% for which he uses to around his nose for facial itching and scaling.   7) OSA: Diagnosed recently and is now managed on CPAP. He's not seeing much of a difference. He was told that he should be evaluated by pulmonology for CPAP setting adjustment in late May but hasn't heard anything else from this. He does see Neurology through [facility].   Review of Systems  Eyes: Negative for visual disturbance.  Respiratory: Negative for shortness of breath.   Cardiovascular: Negative for chest pain.    Gastrointestinal:       Intermittent IBS, feels well managed on medication  Skin:       Intermittent eczema to nasal bridges  Neurological: Negative for weakness.       Past Medical History:  Diagnosis Date  . A-fib ([place])   . BPH (benign prostatic hyperplasia)   . Coronary artery disease    Hx of MI; S/P PCI  . Depression   . GERD (gastroesophageal reflux disease)   . Hyperlipidemia   . Hypertension   . IBS (irritable bowel syndrome)   . IBS (irritable bowel syndrome)   . PNA (pneumonia)   . Skin cancer   . Stroke [facility])      Social History   Socioeconomic History  . Marital status: Married    Spouse name: Not on file  . Number of children: Not on file  . Years of education: Not on file  . Highest education level: Not on file  Occupational History  . Not on file  Social Needs  . Financial resource strain: Not hard at all  . Food insecurity:    Worry: Never true    Inability: Never true  . Transportation needs:    Medical: No    Non-medical: No  Tobacco Use  . Smoking status: Former Research scientist (life sciences)  . Smokeless tobacco: Never Used  Substance and Sexual Activity  . Alcohol use: Yes    Alcohol/week: 1.2 - 1.8 oz    Types: 2 - 3 Glasses of wine per week  Comment: Wine daily with dinner  . Drug use: No  . Sexual activity: Never  Lifestyle  . Physical activity:    Days per week: Not on file    Minutes per session: Not on file  . Stress: Not on file  Relationships  . Social connections:    Talks on phone: Not on file    Gets together: Not on file    Attends religious service: Not on file    Active member of club or organization: Not on file    Attends meetings of clubs or organizations: Not on file    Relationship status: Not on file  . Intimate partner violence:    Fear of current or ex partner: No    Emotionally abused: No    Physically abused: No    Forced sexual activity: No  Other Topics Concern  . Not on file  Social History Narrative  . Not on file     Past Surgical History:  Procedure Laterality Date  . ADENOIDECTOMY  1951  . CARDIAC CATHETERIZATION  2006   X1 STENT  . SKIN CANCER EXCISION N/A 08/2016   Forehead.  [facility] (Dr. Deliah Boston)  . THYROIDECTOMY, PARTIAL      Family History  Problem Relation Age of Onset  . Heart Problems Father   . Heart disease Father   . Hypertension Father   . Mental illness Mother   . Arthritis Mother   . Cancer Mother        ovarian  . Stroke Paternal Uncle   . Heart disease Paternal Grandfather   . Cancer Maternal Aunt   . Stroke Maternal Grandmother   . Sudden death Maternal Grandfather   . Heart disease Paternal Grandmother     Allergies  Allergen Reactions  . Bactrim [Sulfamethoxazole-Trimethoprim] Anaphylaxis    Ulcers  . Crestor [Rosuvastatin Calcium]     Severe Myalgias    Current Outpatient Medications on File Prior to Visit  Medication Sig Dispense Refill  . aspirin 81 MG tablet Take 81 mg by mouth daily.    . bisoprolol (ZEBETA) 5 MG tablet TAKE 1 TABLET (5 MG TOTAL) BY MOUTH DAILY. 90 tablet 3  . Cholecalciferol (VITAMIN D) 2000 UNITS CAPS Take 2,000 Units by mouth 2 (two) times daily.     . Coenzyme Q10 (CO Q 10 PO) Take 300 mg by mouth at bedtime.     . dicyclomine (BENTYL) 10 MG capsule TAKE 1 CAPSULE (10 MG TOTAL) BY MOUTH 2 (TWO) TIMES DAILY. 90 capsule 1  . DULoxetine (CYMBALTA) 20 MG capsule Take 40 mg by mouth daily.    . Evolocumab (REPATHA SURECLICK) 604 MG/ML SOAJ Inject 140 mg into the skin every 14 (fourteen) days. 2 pen 0  . hydrochlorothiazide (HYDRODIURIL) 12.5 MG tablet Take 1 tablet (12.5 mg total) by mouth daily. 90 tablet 3  . lisinopril (PRINIVIL,ZESTRIL) 20 MG tablet TAKE 1 TABLET BY MOUTH TWICE A DAY 180 tablet 3  . nitroGLYCERIN (NITROSTAT) 0.4 MG SL tablet Place 1 tablet (0.4 mg total) every 5 (five) minutes as needed under the tongue for chest pain. 25 tablet 3  . triamcinolone cream (KENALOG) 0.5 % Apply 1 application  topically 2 (two) times daily as needed. For rash 30 g 0  . vitamin B-12 (CYANOCOBALAMIN) 1000 MCG tablet Take 1,000 mcg by mouth daily.    Marland Kitchen warfarin (COUMADIN) 1 MG tablet Take 1 tablet (1 mg) by mouth each Monday with the warfarin 3.75 mg (1/2 tablet)  that you take for total dose of 4.75 mg on Monday. (Patient taking differently: 7.5 mg daily with 1 mg Friday only) 12 tablet 1  . warfarin (COUMADIN) 7.5 MG tablet Take half a tablet by mouth on Monday. Remainder of week 1 tablet daily. 90 tablet 3   No current facility-administered medications on file prior to visit.     BP 116/74   Pulse 87   Temp 97.8 F (36.6 C) (Oral)   Ht 6' 0.5" (1.842 m)   Wt 186 lb 8 oz (84.6 kg)   SpO2 97%   BMI 24.95 kg/m    Objective:   Physical Exam  Constitutional: He is oriented to person, place, and time. He appears well-nourished.  Cardiovascular: Normal rate. An irregularly irregular rhythm present.  Respiratory: Effort normal and breath sounds normal.  Neurological: He is alert and oriented to person, place, and time.  Skin: Skin is warm and dry.  Psychiatric: He has a normal mood and affect.           Assessment & Plan:

## 2017-11-19 NOTE — Assessment & Plan Note (Signed)
Using dicyclomine BID everyday, discussed this can be PRN use. Continue current regimen.

## 2017-11-19 NOTE — Assessment & Plan Note (Signed)
Irregular rhythm today.  Continue Coumadin, will get him set up through our clinic.

## 2017-11-19 NOTE — Assessment & Plan Note (Signed)
LDL from April 2019 at goal. [place] per pharmacy.

## 2017-11-19 NOTE — Assessment & Plan Note (Signed)
Stable in the office today, continue Cymbalta. Following with psychiatry.

## 2017-11-19 NOTE — Assessment & Plan Note (Signed)
Continue lipid and BP control.  No new symptoms.

## 2017-11-19 NOTE — Patient Instructions (Addendum)
INR today 3.3  Hold dose today (6/14) then continue prior dosing and recheck in 2 weeks.   Patient denies any changed to diet, health or medications.  He denies any abnormal bruising or bleeding and is aware of risks associated with a supratherapeutic level and will go to the ER if any concerns develop.

## 2017-11-22 ENCOUNTER — Encounter: Payer: Self-pay | Admitting: Primary Care

## 2017-11-25 ENCOUNTER — Telehealth: Payer: Self-pay | Admitting: Cardiovascular Disease

## 2017-11-25 MED ORDER — BISOPROLOL FUMARATE 10 MG PO TABS: 5.0000 mg | ORAL_TABLET | Freq: Every day | ORAL | 3 refills | Status: DC

## 2017-11-25 NOTE — Telephone Encounter (Signed)
S/w patient. States his pharmacy said the bisoprolol was on back order and not being made anymore. Advised patient I will reach out to pharamcy to find out what other details I can gain.  S/w Lanny Hurst at [employer] [employer]. He [facility] [place] and [facility] tablets at [facility] [address]. He said bisoprolol can be split in half. I would need to send in a prescription from Dr Rockey Situ.

## 2017-11-25 NOTE — Telephone Encounter (Signed)
Patient calling in regards to bisoprolol (ZEBETA) 5MG  Patient states he is having a hard time obtaining this medication due to pharmacy issues  He is wondering if there are any other options to prescribe, he does prefer generic  Please call to discuss

## 2017-11-25 NOTE — Telephone Encounter (Signed)
Called patient. He did not want to drive to [place] but is willing to go to [place]. He verbalized understanding that it would be bisoprolol 10 mg, take 5 mg (0.5 tablet) by mouth once a day. He was very Patent attorney. Rx sent to pharmacy.  Routing to Dr Rockey Situ to make him aware.

## 2017-11-29 ENCOUNTER — Encounter: Payer: Self-pay | Admitting: Primary Care

## 2017-12-02 ENCOUNTER — Ambulatory Visit (INDEPENDENT_AMBULATORY_CARE_PROVIDER_SITE_OTHER): Payer: PPO | Admitting: General Practice

## 2017-12-02 DIAGNOSIS — I4891 Unspecified atrial fibrillation: Secondary | ICD-10-CM

## 2017-12-02 DIAGNOSIS — Z7901 Long term (current) use of anticoagulants: Secondary | ICD-10-CM

## 2017-12-02 DIAGNOSIS — Z8673 Personal history of transient ischemic attack (TIA), and cerebral infarction without residual deficits: Secondary | ICD-10-CM

## 2017-12-02 LAB — POCT INR: INR: 3.4 — AB (ref 2.0–3.0)

## 2017-12-02 NOTE — Patient Instructions (Addendum)
Pre visit review using our clinic review tool, if applicable. No additional management support is needed unless otherwise documented below in the visit note.  Hold dosage today (6/27) and then start taking 1 tablet daily except take 1/2 tablet on Wednesdays.  Re-check in 3 weeks.

## 2017-12-06 ENCOUNTER — Other Ambulatory Visit: Payer: Self-pay | Admitting: Primary Care

## 2017-12-06 ENCOUNTER — Other Ambulatory Visit: Payer: Self-pay | Admitting: Family Medicine

## 2017-12-06 NOTE — Telephone Encounter (Signed)
Recently est care 11/2017--- medication never filled by [facility]... Please advise

## 2017-12-17 ENCOUNTER — Telehealth: Payer: Self-pay | Admitting: Primary Care

## 2017-12-17 ENCOUNTER — Ambulatory Visit: Payer: PPO | Admitting: Family Medicine

## 2017-12-17 NOTE — Telephone Encounter (Signed)
Spoke to pt who states he is needing to increase his flow for his CPAP, and have it placed on automatic versus manual, to assist him with his breathing at night. Order can be faxed to [place]  [phone]

## 2017-12-17 NOTE — Telephone Encounter (Signed)
This order needs to come from either pulmonology or neurology. During his recent visit we discussed to speak with his current neurologist to see if they can take over CPAP settings. He should start by contacting their office first and if they can't then I'll put in a pulmonology referral.

## 2017-12-17 NOTE — Telephone Encounter (Signed)
Copied from [place] [id number]. Topic: Quick Communication - See Telephone Encounter >> Dec 17, 2017  1:56 PM Synthia Innocent wrote: CRM for notification. See Telephone encounter for: 12/17/17. Requesting to Alma Friendly regarding CPAP

## 2017-12-18 DIAGNOSIS — G4733 Obstructive sleep apnea (adult) (pediatric): Secondary | ICD-10-CM | POA: Diagnosis not present

## 2017-12-20 ENCOUNTER — Telehealth: Payer: Self-pay

## 2017-12-20 ENCOUNTER — Telehealth: Payer: Self-pay | Admitting: Neurology

## 2017-12-20 DIAGNOSIS — G4730 Sleep apnea, unspecified: Secondary | ICD-10-CM

## 2017-12-20 NOTE — Telephone Encounter (Signed)
Spoken and notified patient of [name] comments. Patient verbalized understanding.  Patient will contact his neurologist

## 2017-12-20 NOTE — Telephone Encounter (Signed)
Noted, I put in a referral for him to be seen by pulmonology for CPAP titration. Someone should be in touch soon regarding an appointment.

## 2017-12-20 NOTE — Telephone Encounter (Signed)
I called pt. Dr. Rexene Little will be unable to change the pressure on his cpap since she has never seen him for sleep related issues, nor did she order nor interpret his recent sleep study done at [facility] this year. Pt would need to speak with the physician that ordered the cpap. Pt says that Dr. Caryl Bis ordered the sleep study, but is unsure of who ordered the cpap for him. He wants the pressure changed to an auto pap. I encouraged him to find out who ordered the cpap for him and discuss with that physician. Pt verbalized understanding but was frustrated that our office could not help him with this issue.

## 2017-12-20 NOTE — Telephone Encounter (Signed)
Please see phone note 12/17/17.

## 2017-12-20 NOTE — Telephone Encounter (Signed)
Nothing further needed. Pt was seen for tremor and remote Hx of stroke. He had sleep testing through PCP and needs FU with PCP for this and/or provider who did his sleep studies.

## 2017-12-20 NOTE — Telephone Encounter (Signed)
Copied from [place] [id number]. Topic: Quick Communication - See Telephone Encounter >> Dec 17, 2017  1:56 PM Synthia Innocent wrote: CRM for notification. See Telephone encounter for: 12/17/17. Requesting to Alma Friendly regarding CPAP >> Dec 20, 2017  4:20 PM Ahmed Prima L wrote: Patient said that he did contact the neurlogist. He said that since he did not have the sleep study through them then they can not give the approval. They told him that Dr Caryl Bis would need to do it ( he initially ordered it ) he no longer sees him. Please advise.

## 2017-12-20 NOTE — Telephone Encounter (Signed)
Pt called stating that [facility] needing the ok from Dr. Rexene Alberts to change to pts CPAP pressures back to automatic. [place] (f) [phone]

## 2017-12-21 NOTE — Telephone Encounter (Signed)
Spoken and notified patient of Kate Clark's comments. Patient verbalized understanding.  

## 2017-12-23 ENCOUNTER — Ambulatory Visit (INDEPENDENT_AMBULATORY_CARE_PROVIDER_SITE_OTHER): Payer: PPO | Admitting: General Practice

## 2017-12-23 DIAGNOSIS — I4891 Unspecified atrial fibrillation: Secondary | ICD-10-CM

## 2017-12-23 DIAGNOSIS — Z8673 Personal history of transient ischemic attack (TIA), and cerebral infarction without residual deficits: Secondary | ICD-10-CM

## 2017-12-23 DIAGNOSIS — Z7901 Long term (current) use of anticoagulants: Secondary | ICD-10-CM | POA: Diagnosis not present

## 2017-12-23 LAB — POCT INR: INR: 3 (ref 2.0–3.0)

## 2017-12-23 NOTE — Patient Instructions (Addendum)
Pre visit review using our clinic review tool, if applicable. No additional management support is needed unless otherwise documented below in the visit note.  Continue to take 1 tablet daily except take 1/2 tablet on Wednesdays.  Re-check in 4 weeks.

## 2017-12-24 ENCOUNTER — Other Ambulatory Visit (INDEPENDENT_AMBULATORY_CARE_PROVIDER_SITE_OTHER): Payer: PPO

## 2017-12-24 ENCOUNTER — Encounter: Payer: Self-pay | Admitting: Primary Care

## 2017-12-24 ENCOUNTER — Ambulatory Visit (INDEPENDENT_AMBULATORY_CARE_PROVIDER_SITE_OTHER): Payer: PPO | Admitting: Primary Care

## 2017-12-24 VITALS — BP 108/62 | HR 67 | Temp 97.8°F | Ht 72.5 in | Wt 186.8 lb

## 2017-12-24 DIAGNOSIS — R7309 Other abnormal glucose: Secondary | ICD-10-CM | POA: Diagnosis not present

## 2017-12-24 DIAGNOSIS — R000 Tachycardia, unspecified: Secondary | ICD-10-CM | POA: Diagnosis not present

## 2017-12-24 DIAGNOSIS — R002 Palpitations: Secondary | ICD-10-CM | POA: Diagnosis not present

## 2017-12-24 DIAGNOSIS — I480 Paroxysmal atrial fibrillation: Secondary | ICD-10-CM | POA: Diagnosis not present

## 2017-12-24 LAB — BASIC METABOLIC PANEL
BUN: 16 mg/dL (ref 6–23)
CO2: 31 mEq/L (ref 19–32)
Calcium: 9.6 mg/dL (ref 8.4–10.5)
Chloride: 104 mEq/L (ref 96–112)
Creatinine, Ser: 1.18 mg/dL (ref 0.40–1.50)
GFR: 64.53 mL/min (ref >60.00)
Glucose, Bld: 118 mg/dL — ABNORMAL HIGH (ref 70–99)
POTASSIUM: 4.8 meq/L (ref 3.5–5.1)
SODIUM: 141 meq/L (ref 135–145)

## 2017-12-24 LAB — TSH: TSH: 1.52 u[IU]/mL (ref 0.35–4.50)

## 2017-12-24 LAB — HEMOGLOBIN A1C: HEMOGLOBIN A1C: 5.5 % (ref 4.6–6.5)

## 2017-12-24 LAB — CBC
HCT: 43.6 % (ref 39.0–52.0)
Hemoglobin: 14.7 g/dL (ref 13.0–17.0)
MCHC: 33.8 g/dL (ref 30.0–36.0)
MCV: 94.1 fl (ref 78.0–100.0)
Platelets: 206 10*3/uL (ref 150.0–400.0)
RBC: 4.63 Mil/uL (ref 4.22–5.81)
RDW: 13.9 % (ref 11.5–15.5)
WBC: 5 10*3/uL (ref 4.0–10.5)

## 2017-12-24 NOTE — Progress Notes (Signed)
Subjective:    Patient ID: [name], male    DOB: 1945-12-10, 72 y.o.   MRN: [id number]  HPI  [name] is a 72 year old male with a history of CAD, atrial fibrillation, hypertension, OSA who presents today with a chief complaint of fatigue and palpitations.  BP Readings from Last 3 Encounters:  12/24/17 108/62  12/23/17 138/80  11/19/17 116/74   He's noticed elevated blood pressure readings and heart fluttering/palpitations intermittently for the past 3-4 weeks. He has a history of these symptoms intermittently for years but has noticed an increase in frequency over time. He's increased his activity level around his house over the last one month and is more active than usual. He's noticed flutter sensation daily for the past one week, also with fatigue and some shortness of breath. He denies chest pain.   He's been monitoring his HR and BP at home. Highest heart rate was of 110 which will last a few minutes. His HR has been in the 60's-70's for the last 2 days, although he has noticed symptoms of fatigue and palpitations yesterday. He denies symptoms today. His cycle of symptoms will last for several days, but no longer than one week.   He is taking bisoprolol 5 mg for which he found at [facility] in [place]. He has not picked up the 10 mg dose [facility] He's not notified his cardiologist of these symptoms.   Review of Systems  Constitutional: Positive for fatigue.  Respiratory: Negative for shortness of breath.   Cardiovascular: Positive for palpitations. Negative for chest pain.  Neurological: Positive for dizziness. Negative for headaches.       Past Medical History:  Diagnosis Date  . A-fib ([place])   . BPH (benign prostatic hyperplasia)   . Coronary artery disease    Hx of MI; S/P PCI  . Depression   . GERD (gastroesophageal reflux disease)   . Hyperlipidemia   . Hypertension   . IBS (irritable bowel syndrome)   . IBS (irritable  bowel syndrome)   . PNA (pneumonia)   . Skin cancer   . Stroke [facility])      Social History   Socioeconomic History  . Marital status: Married    Spouse name: Not on file  . Number of children: Not on file  . Years of education: Not on file  . Highest education level: Not on file  Occupational History  . Not on file  Social Needs  . Financial resource strain: Not hard at all  . Food insecurity:    Worry: Never true    Inability: Never true  . Transportation needs:    Medical: No    Non-medical: No  Tobacco Use  . Smoking status: Former Research scientist (life sciences)  . Smokeless tobacco: Never Used  Substance and Sexual Activity  . Alcohol use: Yes    Alcohol/week: 1.2 - 1.8 oz    Types: 2 - 3 Glasses of wine per week    Comment: Wine daily with dinner  . Drug use: No  . Sexual activity: Never  Lifestyle  . Physical activity:    Days per week: Not on file    Minutes per session: Not on file  . Stress: Not on file  Relationships  . Social connections:    Talks on phone: Not on file    Gets together: Not on file    Attends religious service: Not on file    Active member of club  or organization: Not on file    Attends meetings of clubs or organizations: Not on file    Relationship status: Not on file  . Intimate partner violence:    Fear of current or ex partner: No    Emotionally abused: No    Physically abused: No    Forced sexual activity: No  Other Topics Concern  . Not on file  Social History Narrative  . Not on file    Past Surgical History:  Procedure Laterality Date  . ADENOIDECTOMY  1951  . CARDIAC CATHETERIZATION  2006   X1 STENT  . SKIN CANCER EXCISION N/A 08/2016   Forehead.  [facility] (Dr. Deliah Boston)  . THYROIDECTOMY, PARTIAL      Family History  Problem Relation Age of Onset  . Heart Problems Father   . Heart disease Father   . Hypertension Father   . Mental illness Mother   . Arthritis Mother   . Cancer Mother        ovarian  . Stroke  Paternal Uncle   . Heart disease Paternal Grandfather   . Cancer Maternal Aunt   . Stroke Maternal Grandmother   . Sudden death Maternal Grandfather   . Heart disease Paternal Grandmother     Allergies  Allergen Reactions  . Bactrim [Sulfamethoxazole-Trimethoprim] Anaphylaxis    Ulcers  . Crestor [Rosuvastatin Calcium]     Severe Myalgias    Current Outpatient Medications on File Prior to Visit  Medication Sig Dispense Refill  . aspirin 81 MG tablet Take 81 mg by mouth daily.    . bisoprolol (ZEBETA) 10 MG tablet Take 0.5 tablets (5 mg total) by mouth daily. 45 tablet 3  . bisoprolol (ZEBETA) 5 MG tablet Take 5 mg by mouth daily.    . Cholecalciferol (VITAMIN D) 2000 UNITS CAPS Take 2,000 Units by mouth 2 (two) times daily.     . Coenzyme Q10 (CO Q 10 PO) Take 300 mg by mouth at bedtime.     . dicyclomine (BENTYL) 10 MG capsule TAKE 1 CAPSULE (10 MG TOTAL) BY MOUTH 2 (TWO) TIMES DAILY. 90 capsule 1  . DULoxetine (CYMBALTA) 20 MG capsule Take 40 mg by mouth daily.    . Evolocumab (REPATHA SURECLICK) 494 MG/ML SOAJ Inject 140 mg into the skin every 14 (fourteen) days. 2 pen 0  . hydrochlorothiazide (HYDRODIURIL) 12.5 MG tablet Take 1 tablet (12.5 mg total) by mouth daily. 90 tablet 3  . lisinopril (PRINIVIL,ZESTRIL) 20 MG tablet TAKE 1 TABLET BY MOUTH TWICE A DAY 180 tablet 3  . nitroGLYCERIN (NITROSTAT) 0.4 MG SL tablet Place 1 tablet (0.4 mg total) every 5 (five) minutes as needed under the tongue for chest pain. 25 tablet 3  . ranitidine (ZANTAC) 150 MG tablet TAKE 1 TABLET (150 MG TOTAL) BY MOUTH 2 (TWO) TIMES DAILY. 180 tablet 4  . triamcinolone cream (KENALOG) 0.5 % Apply 1 application topically 2 (two) times daily as needed. For rash 30 g 0  . vitamin B-12 (CYANOCOBALAMIN) 1000 MCG tablet Take 1,000 mcg by mouth daily.    Marland Kitchen warfarin (COUMADIN) 1 MG tablet Take 1 tablet (1 mg) by mouth each Monday with the warfarin 3.75 mg (1/2 tablet) that you take for total dose of 4.75 mg on  Monday. (Patient taking differently: 7.5 mg daily with 1 mg Friday only) 12 tablet 1  . warfarin (COUMADIN) 7.5 MG tablet Take half a tablet by mouth on Monday. Remainder of week 1 tablet daily. [place]  tablet 3   No current facility-administered medications on file prior to visit.     BP 108/62   Pulse 67   Temp 97.8 F (36.6 C) (Oral)   Ht 6' 0.5" (1.842 m)   Wt 186 lb 12 oz (84.7 kg)   SpO2 97%   BMI 24.98 kg/m    Objective:   Physical Exam  Constitutional: He appears well-nourished.  Neck: Neck supple.  Cardiovascular: Normal rate, regular rhythm and normal heart sounds.  No murmur heard. Respiratory: Effort normal and breath sounds normal.  Skin: Skin is warm and dry.  Psychiatric: He has a normal mood and affect.           Assessment & Plan:

## 2017-12-24 NOTE — Patient Instructions (Signed)
Stop by the lab prior to leaving today. I will notify you of your results once received.   I will be in touch with your cardiologist.  Meet with Azalee Course regarding your pulmonology appointment.   Please go to the hospital if your heart rate remains elevated for more than one hour with symptoms of dizziness, fatigue, etc.   It was a pleasure to see you today!

## 2017-12-24 NOTE — Assessment & Plan Note (Signed)
Rate and rhythm regular today.  Suspect symptoms are secondary to his paroxysmal atrial fibrillation, which are bothersome during occurrence.   Recommended he discuss symptoms with cardiology, but he prefers for me to provide contact. Will cc cardiology on this note for input.  He will continue bisoprolol 5 mg for now, may require medication change if symptomatic with bisoprolol. This could be cause for some of his fatigue. Dose increase may be out of the question as his HR today is 67.  Discussed strict emergency precautions today, he verbalized understanding.

## 2017-12-24 NOTE — Assessment & Plan Note (Signed)
Suspect secondary to paroxysmal atrial fibrillation. Rule out metabolic cause with TSH, CBC, BMP. Will cc cardiology on this note for further input.  Consider Holter Monitor.  NSR noted on exam.

## 2017-12-25 NOTE — Progress Notes (Signed)
We can order a 2 week zio monitor if he would like Triage, can we call him Previous Holter showed short runs of atrial tachycardia, he does have history of PAF and is on Coumadin For symptoms could try higher dose beta blocker 10 mg, that might suppress it

## 2017-12-27 ENCOUNTER — Telehealth: Payer: Self-pay | Admitting: *Deleted

## 2017-12-27 DIAGNOSIS — I480 Paroxysmal atrial fibrillation: Secondary | ICD-10-CM

## 2017-12-27 NOTE — Telephone Encounter (Signed)
-----   Message from Minna Merritts, MD sent at 12/25/2017  2:17 PM EDT -----   ----- Message ----- From: Pleas Koch, NP Sent: 12/24/2017  12:56 PM To: Minna Merritts, MD  FYI and any input? Perhaps Holter Monitor eval? I can have him follow up with you if need be.

## 2017-12-27 NOTE — Telephone Encounter (Signed)
Left voicemail message to call back  

## 2017-12-28 NOTE — Telephone Encounter (Signed)
Pt returning our call please call back  °

## 2017-12-28 NOTE — Telephone Encounter (Signed)
Spoke with patient and reviewed recommendations by provider to wear monitor for further evaluation and possibly increase medication. Patient was agreeable with this plan but would like to wait until after the monitor to increase medication. Advised that I would have someone call to and set him up to come in to have the monitor placed. He verbalized understanding of our conversation, agreement with plan, and had no further questions at this time.      Minna Merritts, MD at 12/24/2017 10:00 AM   Status: Signed    We can order a 2 week zio monitor if he would like Triage, can we call him Previous Holter showed short runs of atrial tachycardia, he does have history of PAF and is on Coumadin For symptoms could try higher dose beta blocker 10 mg, that might suppress it

## 2017-12-30 NOTE — Telephone Encounter (Signed)
Patient scheduled for 01/06/18

## 2017-12-31 ENCOUNTER — Other Ambulatory Visit: Payer: Self-pay | Admitting: Family Medicine

## 2017-12-31 ENCOUNTER — Other Ambulatory Visit: Payer: Self-pay | Admitting: Primary Care

## 2018-01-06 ENCOUNTER — Ambulatory Visit (INDEPENDENT_AMBULATORY_CARE_PROVIDER_SITE_OTHER): Payer: PPO

## 2018-01-06 DIAGNOSIS — I480 Paroxysmal atrial fibrillation: Secondary | ICD-10-CM

## 2018-01-11 ENCOUNTER — Encounter: Payer: Self-pay | Admitting: Pulmonary Disease

## 2018-01-11 ENCOUNTER — Ambulatory Visit: Payer: PPO | Admitting: Pulmonary Disease

## 2018-01-11 VITALS — BP 128/78 | HR 68 | Ht 73.0 in | Wt 189.0 lb

## 2018-01-11 DIAGNOSIS — G4730 Sleep apnea, unspecified: Secondary | ICD-10-CM | POA: Diagnosis not present

## 2018-01-11 NOTE — Patient Instructions (Signed)
History of obstructive sleep apnea  With history of central sleep apnea  Periodic limb movement of sleep  Will adjust BiPAP to 15/9  Obtain a download in about 4 weeks  We will see you back in 6 weeks  Call if pressures do not seem appropriate  If adjusting the pressures do not seem to be working, we may need to repeat a titration study with consideration for BiPAP ST or adaptive servo  Continue with current interface  We will follow-up on our discussions about possible restless legs in a few weeks

## 2018-01-11 NOTE — Progress Notes (Signed)
[name]    [id number]    1946/03/24  Primary Care Physician:Clark, Leticia Penna, NP  Referring Physician: Pleas Koch, NP [address] [place], [place] [address]  Chief complaint:   History of obstructive sleep apnea, central sleep apnea Significant cardiac history BiPAP does not seem to be helping his symptoms are present  HPI:  Diagnosed with sleep disordered breathing in April 2019 Had a lot of difficulty with initial diagnosis and during the treatment titration as well Did not feel he slept well Has not felt that the treatment is helping so far  Still having issues with fatigue, feels pressure is not adequate He does have a lot of pain and discomfort at night  history of depression and treatment that may contribute to periodic limb movement of sleep which was noted during the study  He has some symptoms of discomfort in his legs whenever he lays down-symptoms are not classic for restless legs  He does get about 8 hours of sleep almost every night Wakes up feeling tired and fatigued Feels pressure may be inadequate Not feeling better with treatment so far  Pets: Dogs  Occupation: No pertinent occupational history Exposures: No Significant exposures Smoking history: Reformed smoker   Outpatient Encounter Medications as of 01/11/2018  Medication Sig  . aspirin 81 MG tablet Take 81 mg by mouth daily.  . bisoprolol (ZEBETA) 5 MG tablet Take 5 mg by mouth daily.  . Cholecalciferol (VITAMIN D) 2000 UNITS CAPS Take 2,000 Units by mouth 2 (two) times daily.   . Coenzyme Q10 (CO Q 10 PO) Take 300 mg by mouth at bedtime.   . dicyclomine (BENTYL) 10 MG capsule TAKE 1 CAPSULE (10 MG TOTAL) BY MOUTH 2 (TWO) TIMES DAILY.  . DULoxetine (CYMBALTA) 20 MG capsule Take 40 mg by mouth daily.  . Evolocumab (REPATHA SURECLICK) 810 MG/ML SOAJ Inject 140 mg into the skin every 14 (fourteen) days.  . hydrochlorothiazide (HYDRODIURIL) 12.5 MG tablet Take 1 tablet  (12.5 mg total) by mouth daily.  Marland Kitchen lisinopril (PRINIVIL,ZESTRIL) 20 MG tablet TAKE 1 TABLET BY MOUTH TWICE A DAY  . nitroGLYCERIN (NITROSTAT) 0.4 MG SL tablet Place 1 tablet (0.4 mg total) every 5 (five) minutes as needed under the tongue for chest pain.  . ranitidine (ZANTAC) 150 MG tablet TAKE 1 TABLET (150 MG TOTAL) BY MOUTH 2 (TWO) TIMES DAILY.  Marland Kitchen triamcinolone cream (KENALOG) 0.5 % Apply 1 application topically 2 (two) times daily as needed. For rash  . vitamin B-12 (CYANOCOBALAMIN) 1000 MCG tablet Take 1,000 mcg by mouth daily.  Marland Kitchen warfarin (COUMADIN) 1 MG tablet Take 1 tablet (1 mg) by mouth each Monday with the warfarin 3.75 mg (1/2 tablet) that you take for total dose of 4.75 mg on Monday. (Patient taking differently: 7.5 mg daily with 1 mg Friday only)  . warfarin (COUMADIN) 7.5 MG tablet Take half a tablet by mouth on Monday. Remainder of week 1 tablet daily.  . [DISCONTINUED] bisoprolol (ZEBETA) 10 MG tablet Take 0.5 tablets (5 mg total) by mouth daily.   No facility-administered encounter medications on file as of 01/11/2018.     Allergies as of 01/11/2018 - Review Complete 01/11/2018  Allergen Reaction Noted  . Bactrim [sulfamethoxazole-trimethoprim] Anaphylaxis 01/23/2015  . Crestor [rosuvastatin calcium]  02/11/2016    Past Medical History:  Diagnosis Date  . A-fib ([place])   . BPH (benign prostatic hyperplasia)   . Coronary artery disease  Hx of MI; S/P PCI  . Depression   . GERD (gastroesophageal reflux disease)   . Hyperlipidemia   . Hypertension   . IBS (irritable bowel syndrome)   . IBS (irritable bowel syndrome)   . PNA (pneumonia)   . Skin cancer   . Stroke [facility])     Past Surgical History:  Procedure Laterality Date  . ADENOIDECTOMY  1951  . CARDIAC CATHETERIZATION  2006   X1 STENT  . SKIN CANCER EXCISION N/A 08/2016   Forehead.  [facility] (Dr. Deliah Boston)  . THYROIDECTOMY, PARTIAL      Family History  Problem Relation Age of Onset  .  Heart Problems Father   . Heart disease Father   . Hypertension Father   . Mental illness Mother   . Arthritis Mother   . Cancer Mother        ovarian  . Stroke Paternal Uncle   . Heart disease Paternal Grandfather   . Cancer Maternal Aunt   . Stroke Maternal Grandmother   . Sudden death Maternal Grandfather   . Heart disease Paternal Grandmother     Social History   Socioeconomic History  . Marital status: Married    Spouse name: Not on file  . Number of children: Not on file  . Years of education: Not on file  . Highest education level: Not on file  Occupational History  . Not on file  Social Needs  . Financial resource strain: Not hard at all  . Food insecurity:    Worry: Never true    Inability: Never true  . Transportation needs:    Medical: No    Non-medical: No  Tobacco Use  . Smoking status: Former Research scientist (life sciences)  . Smokeless tobacco: Never Used  Substance and Sexual Activity  . Alcohol use: Yes    Alcohol/week: 1.2 - 1.8 oz    Types: 2 - 3 Glasses of wine per week    Comment: Wine daily with dinner  . Drug use: No  . Sexual activity: Never  Lifestyle  . Physical activity:    Days per week: Not on file    Minutes per session: Not on file  . Stress: Not on file  Relationships  . Social connections:    Talks on phone: Not on file    Gets together: Not on file    Attends religious service: Not on file    Active member of club or organization: Not on file    Attends meetings of clubs or organizations: Not on file    Relationship status: Not on file  . Intimate partner violence:    Fear of current or ex partner: No    Emotionally abused: No    Physically abused: No    Forced sexual activity: No  Other Topics Concern  . Not on file  Social History Narrative  . Not on file    Review of systems: Review of Systems  Constitutional: Negative for fever and chills.  HENT: Negative.   Eyes: Negative for blurred vision.  Respiratory: as per HPI    Cardiovascular: Negative for chest pain and palpitations.  Significant history of cardiac issues, atrial fibrillation Gastrointestinal: Negative for vomiting, diarrhea, blood per rectum. Genitourinary: Negative for dysuria, urgency, frequency and hematuria.  Musculoskeletal: Significant muscular skeletal pain and discomfort Skin: Negative for itching and rash.  Neurological: Negative for dizziness, tremors, focal weakness, seizures and loss of consciousness.  Endo/Heme/Allergies: Negative for environmental allergies.  Psychiatric/Behavioral: Significant for depression.  All other systems reviewed and are negative.  Physical Exam: Vitals:   01/11/18 0909  BP: 128/78  Pulse: 68  SpO2: 97%    Gen:      No acute distress HEENT:  EOMI, sclera anicteric, crowded oropharynx, Mallampati 2 Neck:     No masses; no thyromegaly Lungs:    Clear to auscultation bilaterally; normal respiratory effort CV:         Irregular, S1-S2; no murmurs  Abd:      + bowel sounds; soft, non-tender; no palpable masses, no distension Ext:    No edema; adequate peripheral perfusion Skin:      Warm and dry; no rash  Neuro: alert and oriented x 3 Psych: normal mood and affect  Data Reviewed: Polysomnogram reviewed revealing severe obstructive sleep apnea adequately treated with BiPAP of 13/7  Compliance data reveals 93% compliance with an AHI of 14.4  Assessment:   Obstructive sleep apnea  Central sleep apnea  Atrial fibrillation  Urinary artery disease  Hypertension  History of depression  Chronic fatigue  Nonrestorative sleep  Periodic limb movement of sleep  Plan/Recommendations:  .  Adjust BiPAP to 15/9 -Feels pressure may be inadequate  .  Follow-up with compliance reports- -goal is to reduce his AHI  .  The possibility of requiring titration to a different treatment mode was discussed BiPAP ST or adaptive servo may be needed for central sleep apnea  .  Restless legs was  discussed-symptoms not typical, he does have periodic limb movement of sleep-may be related to his antidepressants, muscular skeletal pains and discomfort  .  Continue optimizing sleep hygiene   Sherrilyn Rist MD [facility] 01/11/2018, 9:52 AM  CC: Pleas Koch, NP

## 2018-01-18 ENCOUNTER — Ambulatory Visit (INDEPENDENT_AMBULATORY_CARE_PROVIDER_SITE_OTHER): Payer: PPO

## 2018-01-18 DIAGNOSIS — Z7901 Long term (current) use of anticoagulants: Secondary | ICD-10-CM | POA: Diagnosis not present

## 2018-01-18 DIAGNOSIS — Z8673 Personal history of transient ischemic attack (TIA), and cerebral infarction without residual deficits: Secondary | ICD-10-CM

## 2018-01-18 DIAGNOSIS — I480 Paroxysmal atrial fibrillation: Secondary | ICD-10-CM

## 2018-01-18 LAB — POCT INR: INR: 1 — AB (ref 2.0–3.0)

## 2018-01-18 NOTE — Patient Instructions (Addendum)
NR today 1.0  Take 11.25mg  today (8/13), and 7.5mg  tomorrow (8/14), and then resume prior dosing of 1 tablet daily except take 1/2 tablet on Wednesdays.  Re-check in 1 week.  Patient is unsure why his level is so low.  He thinks that he missed 1 dose last week but doesn't think any more than that.  He denies any other changes in diet health or medications and understands the risks associated with a subtherapeutic level.  He will go to the ER if any concerns develop.

## 2018-01-19 ENCOUNTER — Telehealth: Payer: Self-pay | Admitting: Pulmonary Disease

## 2018-01-19 DIAGNOSIS — G4733 Obstructive sleep apnea (adult) (pediatric): Secondary | ICD-10-CM | POA: Diagnosis not present

## 2018-01-19 NOTE — Telephone Encounter (Signed)
Called and spoke with patient to follow up with the patient to see is the new bipap setting are helping. He state that he is still having a High AHI at night but feels a little bit better but not much.   I let the patient know I would be getting a download off of [employer]view and will be sending this to Doctor Olalere.  Dr. Ander Slade please advise thank you.

## 2018-01-19 NOTE — Telephone Encounter (Signed)
Download with patient still showing significant number of apneic events with an AHI of 20  He will need titrated to BiPAP ST or adaptive servo ventilation   Order to be placed for titration study for central sleep apnea  I did discuss this with the patient, he will be waiting for call, he wants to know whether his insurance will pay for titration  No changes to his current set up at present

## 2018-01-21 ENCOUNTER — Telehealth: Payer: Self-pay | Admitting: Pulmonary Disease

## 2018-01-21 NOTE — Telephone Encounter (Signed)
Message was read by Dr.Olalere and he has called an spoke with the patient .  Nothing further needed at this time.

## 2018-01-21 NOTE — Telephone Encounter (Signed)
Order Has been placed for bipap titration with adaptive servo ventilation.

## 2018-01-21 NOTE — Addendum Note (Signed)
Addended by: Madolyn Frieze on: 01/21/2018 09:08 AM   Modules accepted: Orders

## 2018-01-25 DIAGNOSIS — R002 Palpitations: Secondary | ICD-10-CM | POA: Diagnosis not present

## 2018-01-27 ENCOUNTER — Ambulatory Visit (INDEPENDENT_AMBULATORY_CARE_PROVIDER_SITE_OTHER): Payer: PPO | Admitting: General Practice

## 2018-01-27 DIAGNOSIS — Z7901 Long term (current) use of anticoagulants: Secondary | ICD-10-CM | POA: Diagnosis not present

## 2018-01-27 DIAGNOSIS — Z8673 Personal history of transient ischemic attack (TIA), and cerebral infarction without residual deficits: Secondary | ICD-10-CM

## 2018-01-27 DIAGNOSIS — I4891 Unspecified atrial fibrillation: Secondary | ICD-10-CM

## 2018-01-27 LAB — POCT INR: INR: 3 (ref 2.0–3.0)

## 2018-01-27 NOTE — Patient Instructions (Addendum)
Pre visit review using our clinic review tool, if applicable. No additional management support is needed unless otherwise documented below in the visit note.  Continue to take 1 tablet daily except 1/2 tablet on Wednesdays.  Re-check 4 weeks.

## 2018-02-08 ENCOUNTER — Other Ambulatory Visit: Payer: Self-pay | Admitting: *Deleted

## 2018-02-08 MED ORDER — BISOPROLOL FUMARATE 10 MG PO TABS: 10.0000 mg | ORAL_TABLET | Freq: Every day | ORAL | 3 refills | Status: DC

## 2018-02-10 ENCOUNTER — Other Ambulatory Visit: Payer: Self-pay | Admitting: Primary Care

## 2018-02-10 DIAGNOSIS — G4733 Obstructive sleep apnea (adult) (pediatric): Secondary | ICD-10-CM | POA: Diagnosis not present

## 2018-02-19 DIAGNOSIS — G4733 Obstructive sleep apnea (adult) (pediatric): Secondary | ICD-10-CM | POA: Diagnosis not present

## 2018-02-21 DIAGNOSIS — F330 Major depressive disorder, recurrent, mild: Secondary | ICD-10-CM | POA: Diagnosis not present

## 2018-02-21 DIAGNOSIS — F411 Generalized anxiety disorder: Secondary | ICD-10-CM | POA: Diagnosis not present

## 2018-02-22 ENCOUNTER — Ambulatory Visit: Payer: PPO | Admitting: Pulmonary Disease

## 2018-02-23 ENCOUNTER — Ambulatory Visit (HOSPITAL_BASED_OUTPATIENT_CLINIC_OR_DEPARTMENT_OTHER): Payer: PPO | Attending: Pulmonary Disease | Admitting: Pulmonary Disease

## 2018-02-23 VITALS — Ht 73.0 in | Wt 185.0 lb

## 2018-02-23 DIAGNOSIS — G4733 Obstructive sleep apnea (adult) (pediatric): Secondary | ICD-10-CM | POA: Diagnosis not present

## 2018-02-23 DIAGNOSIS — G4761 Periodic limb movement disorder: Secondary | ICD-10-CM | POA: Diagnosis not present

## 2018-02-24 ENCOUNTER — Ambulatory Visit (INDEPENDENT_AMBULATORY_CARE_PROVIDER_SITE_OTHER): Payer: PPO | Admitting: General Practice

## 2018-02-24 DIAGNOSIS — Z8673 Personal history of transient ischemic attack (TIA), and cerebral infarction without residual deficits: Secondary | ICD-10-CM

## 2018-02-24 DIAGNOSIS — Z7901 Long term (current) use of anticoagulants: Secondary | ICD-10-CM

## 2018-02-24 DIAGNOSIS — I4891 Unspecified atrial fibrillation: Secondary | ICD-10-CM

## 2018-02-24 LAB — POCT INR: INR: 3.2 — AB (ref 2.0–3.0)

## 2018-02-24 NOTE — Patient Instructions (Signed)
Pre visit review using our clinic review tool, if applicable. No additional management support is needed unless otherwise documented below in the visit note.  Take 1/2 tablet today (9/19) and then change dosage and take 1 tablet daily except 1/2 tablet on Wednesdays and Saturdays.  Re-check 4 weeks.

## 2018-03-01 ENCOUNTER — Telehealth: Payer: Self-pay | Admitting: Pulmonary Disease

## 2018-03-01 DIAGNOSIS — G4730 Sleep apnea, unspecified: Secondary | ICD-10-CM

## 2018-03-01 NOTE — Telephone Encounter (Signed)
Sleep study results  DME referral  - Trial of BiPAP therapy on 14/10 cm H2O with a Medium size Resmed Nasal Pillow Mask AirFit P30 mask and heated humidification.  Follow-up in the office 4 to 6 weeks following initiation of treatment

## 2018-03-01 NOTE — Telephone Encounter (Signed)
LMOM to call back

## 2018-03-01 NOTE — Procedures (Signed)
POLYSOMNOGRAPHY  Last, First: [name], [name] MRN: [id number] Gender: Male Age (years): 66 Weight (lbs): 185 DOB: 10-24-1945 BMI: 24 Primary Care: Pleas Koch Epworth Score: 8 Referring: Laurin Coder MD Technician: Laren Everts Interpreting: Laurin Coder MD Study Type: BiPAP Ordered Study Type: BiPAP Study date: 02/23/2018 Location: [place] CLINICAL INFORMATION [name] is a 72 year old Male and was referred to the sleep center for evaluation of G47.33 OSA: Adult and Pediatric (327.23). Indications include Central Sleep Apnea, OSA.  MEDICATIONS Patient self administered medications include: N/A. Medications administered during study include No sleep medicine administered.  SLEEP STUDY TECHNIQUE The patient underwent an attended overnight polysomnography titration to assess the effects of BIPAP therapy. The following variables were monitored: EEG(C4-A1, C3-A2, O1-A2, O2-A1), EOG, submental and leg EMG, ECG, oxyhemoglobin saturation by pulse oximetry, thoracic and abdominal respiratory effort belts, nasal/oral airflow by pressure sensor, body position sensor and snoring sensor. BIPAP pressure was titrated to eliminate apneas, hypopneas and oxygen desaturation. Hypopneas were scored per AASM definition IB (4% desaturation)  TECHNICIAN COMMENTS Comments added by Technician: Patient had difficulty initiating sleep. Patient was restless all through the night. Comments added by Scorer: N/A SLEEP ARCHITECTURE The study was initiated at 9:50:59 PM and terminated at 5:58:27 AM. Total recorded time was 487.5 minutes. EEG confirmed total sleep time was 338 minutes yielding a sleep efficiency of 69.3%%. Sleep onset after lights out was 33.3 minutes with a REM latency of 320.5 minutes. The patient spent 8.3%% of the night in stage N1 sleep, 67.9%% in stage N2 sleep, 0.0%% in stage N3 and 23.8% in REM. The Arousal Index was 33.2/hour. RESPIRATORY PARAMETERS The overall  AHI was 4.3 per hour, and the RDI was 9.9 events/hour with a central apnea index of 2.8per hour. The most appropriate setting of BiPAP was 14/10 cm H2O. At this setting, the sleep efficiency was 94 % and the patient was supine for 0%. The AHI was 4.8 events per hour, and the RDI was 11.9 events/hour (with 2.8 central events) and the arousal index was 11.9 per hour.The oxygen nadir was 93.0% during sleep.    The cumulative time under 88% oxygen saturation was 5.5 minutes  LEG MOVEMENT DATA The total leg movements were 387 with a resulting leg movement index of 68.7. Associated arousal with leg movement index was 11.5. CARDIAC DATA The underlying cardiac rhythm was most consistent with sinus rhythm. Mean heart rate during sleep was 60.3 bpm. Additional rhythm abnormalities include PVCs.   IMPRESSIONS - Obstructive Sleep apnea(OSA) Optimal pressure attained. - Electrocardiographic data showed presence of PVCs. - No significant Oxygen Desaturation - No snoring was audible during this study. - No significant periodic leg movements(PLMs) during sleep.  - Arousals were significant with an arousal index of 11.5 /hour.   DIAGNOSIS - Obstructive Sleep Apnea (327.23 [G47.33 ICD-10]) - Periodic Limb Movement During Sleep (327.51 [G47.61 ICD-10])   RECOMMENDATIONS - Trial of BiPAP therapy on 14/10 cm H2O with a Medium size Resmed Nasal Pillow Mask AirFit P30 mask and heated humidification. - Avoid alcohol, sedatives and other CNS depressants that may worsen sleep apnea and disrupt normal sleep architecture. - Sleep hygiene should be reviewed to assess factors that may improve sleep quality. - Weight management and regular exercise should be initiated or continued. - Return to Sleep Center for re-evaluation after 4 weeks of therapy  [Electronically signed] 03/01/2018 05:45 AM  Sherrilyn Rist MD NPI: [id number]

## 2018-03-02 NOTE — Telephone Encounter (Signed)
Pt is calling back [phone]

## 2018-03-02 NOTE — Telephone Encounter (Signed)
Patient is aware of results  And order  have been placed

## 2018-03-07 ENCOUNTER — Telehealth: Payer: Self-pay | Admitting: Pulmonary Disease

## 2018-03-07 DIAGNOSIS — G4733 Obstructive sleep apnea (adult) (pediatric): Secondary | ICD-10-CM

## 2018-03-07 NOTE — Telephone Encounter (Signed)
Order has been corrected. Nothing further was needed. 

## 2018-03-21 DIAGNOSIS — G4733 Obstructive sleep apnea (adult) (pediatric): Secondary | ICD-10-CM | POA: Diagnosis not present

## 2018-03-24 ENCOUNTER — Ambulatory Visit (INDEPENDENT_AMBULATORY_CARE_PROVIDER_SITE_OTHER): Payer: PPO | Admitting: General Practice

## 2018-03-24 DIAGNOSIS — I4891 Unspecified atrial fibrillation: Secondary | ICD-10-CM

## 2018-03-24 DIAGNOSIS — Z23 Encounter for immunization: Secondary | ICD-10-CM | POA: Diagnosis not present

## 2018-03-24 DIAGNOSIS — Z7901 Long term (current) use of anticoagulants: Secondary | ICD-10-CM

## 2018-03-24 DIAGNOSIS — Z8673 Personal history of transient ischemic attack (TIA), and cerebral infarction without residual deficits: Secondary | ICD-10-CM

## 2018-03-24 LAB — POCT INR: INR: 2.5 (ref 2.0–3.0)

## 2018-03-24 NOTE — Patient Instructions (Signed)
Pre visit review using our clinic review tool, if applicable. No additional management support is needed unless otherwise documented below in the visit note.  Take 1/2 tablet today (9/19) and then change dosage and take 1 tablet daily except 1/2 tablet on Wednesdays and Saturdays.  Re-check 5 weeks.

## 2018-03-28 DIAGNOSIS — D2261 Melanocytic nevi of right upper limb, including shoulder: Secondary | ICD-10-CM | POA: Diagnosis not present

## 2018-03-28 DIAGNOSIS — X32XXXA Exposure to sunlight, initial encounter: Secondary | ICD-10-CM | POA: Diagnosis not present

## 2018-03-28 DIAGNOSIS — L570 Actinic keratosis: Secondary | ICD-10-CM | POA: Diagnosis not present

## 2018-03-28 DIAGNOSIS — D2272 Melanocytic nevi of left lower limb, including hip: Secondary | ICD-10-CM | POA: Diagnosis not present

## 2018-03-28 DIAGNOSIS — Z08 Encounter for follow-up examination after completed treatment for malignant neoplasm: Secondary | ICD-10-CM | POA: Diagnosis not present

## 2018-03-28 DIAGNOSIS — Z85820 Personal history of malignant melanoma of skin: Secondary | ICD-10-CM | POA: Diagnosis not present

## 2018-03-28 DIAGNOSIS — Z85828 Personal history of other malignant neoplasm of skin: Secondary | ICD-10-CM | POA: Diagnosis not present

## 2018-03-30 ENCOUNTER — Other Ambulatory Visit: Payer: Self-pay | Admitting: Family Medicine

## 2018-03-31 ENCOUNTER — Telehealth: Payer: Self-pay | Admitting: Pulmonary Disease

## 2018-03-31 ENCOUNTER — Ambulatory Visit (INDEPENDENT_AMBULATORY_CARE_PROVIDER_SITE_OTHER): Payer: PPO | Admitting: Pulmonary Disease

## 2018-03-31 ENCOUNTER — Encounter: Payer: Self-pay | Admitting: Pulmonary Disease

## 2018-03-31 VITALS — BP 128/72 | HR 97 | Ht 73.0 in | Wt 191.8 lb

## 2018-03-31 DIAGNOSIS — J449 Chronic obstructive pulmonary disease, unspecified: Secondary | ICD-10-CM | POA: Diagnosis not present

## 2018-03-31 MED ORDER — ESZOPICLONE 2 MG PO TABS: 2.0000 mg | ORAL_TABLET | Freq: Every evening | ORAL | 3 refills | Status: DC | PRN

## 2018-03-31 NOTE — Telephone Encounter (Signed)
Spoke with the pt  He states needing PA for [employer] [facility] and spoke with [facility]  She gave me the number to call for PA- [phone]  Pt ID number [id number]   Called to initiate PA   Spoke with Lilia Pro and answered the clinical questions  Medication approved  Lilia Pro said to await fax confirmation before informing the pharmacy  Will await fax (gave triage fax number)

## 2018-03-31 NOTE — Telephone Encounter (Signed)
I called [facility] and spoke with Judson Roch and she can rx and it did go through  Pt aware and nothing further needed

## 2018-03-31 NOTE — Addendum Note (Signed)
Addended by: Shirlee More R on: 03/31/2018 12:45 PM   Modules accepted: Orders

## 2018-03-31 NOTE — Patient Instructions (Addendum)
Sleep apnea-improved symptoms We will increase IPAP to 16 -Pressure appears to soft initially  Trial with Lunesta 2 mg for sleep onset insomnia, may discontinue Lunesta if it is not well tolerated  Encouraged to call if any changes in symptoms or intolerance  We will see you back in 3 months

## 2018-04-01 ENCOUNTER — Telehealth: Payer: Self-pay | Admitting: Pulmonary Disease

## 2018-04-01 NOTE — Telephone Encounter (Signed)
Called and spoke to patient, patient states [name] is too expensive and he is requesting something else to help him sleep. Call placed to [facility] [employer], every medication on his formulary is tier 4 which means they will all be expensive, atleast $80 or higher, which patient states is too expensive. Patient states he is willing to try Melatonin.   OA please advise?

## 2018-04-05 NOTE — Telephone Encounter (Signed)
Zolpidem 5 mg p.o. Nightly, maximum dose about 10  Zaleplon will be another option, started 5 mg nightly, may be increased to 10  Melatonin is not FDA approved People have used  between 5 and 10 mg--will not recommend this

## 2018-04-05 NOTE — Telephone Encounter (Signed)
Patient returned phone call to office. Spoke with patient. Let him know Dr. Judson Roch recommendations. Patient requested to have message sent to him on MyChart so he could take the medication names to pharmacy to determine costs.patient will call back when he decides which drug he would like to use.   MyChart message sent  Will route to [facility] to follow up on

## 2018-04-05 NOTE — Telephone Encounter (Signed)
Attempted to call pt but no answer. Left message for pt to return call. 

## 2018-04-07 ENCOUNTER — Other Ambulatory Visit: Payer: Self-pay | Admitting: Primary Care

## 2018-04-21 DIAGNOSIS — G4733 Obstructive sleep apnea (adult) (pediatric): Secondary | ICD-10-CM | POA: Diagnosis not present

## 2018-04-21 NOTE — Progress Notes (Signed)
Cardiology Office Note  Date:  04/22/2018   ID:  [name], DOB 12/01/1945, MRN [id number]  PCP:  Pleas Koch, NP   Chief Complaint  Patient presents with  . Other    12 month follow up. ZIO 01/06/2018. Patient c/o SOB, less engery, and right shoulder pain. Meds reviewed verbally with patient.     HPI:  72 y/o male with a history of  PAF,  prior stroke treated with TPA with mild residual left sided weakness,  Depression with chronic fatigue,  chronic anticoagulation therapy with Coumadin and  h/o CAD s/p PCI to the LCx in 2007.  mini stroke in 2015 seen on PET scan, did not have symptoms and was on Coumadin at the time. Heavy ETOH, wine per night 3 to 5+ per night We previously ordered stress test for chest pain, he did not complete this Presents for f/u of his CAD, new chest pain  In follow-up today he reports feeling chronically exhausted Reports having terrible sleep apnea, Unable to fall asleep, unable to stay asleep, wakes up feeling tired  Central study positive On CPAP 6-7 months Wake for hours, awake for 2 hours Tried bipap  Legs feel weak and back pain Shoulder issue, pain to elbow No regular exercise program, very deconditioned at baseline  Previous event monitor reviewed with him showing 1 long stretch of atrial fibrillation lasting over 24 hours otherwise maintain normal sinus rhythm, 6% burden A. Fib  EKG personally reviewed by myself on todays visit Shows atrial fibrillation with ventricular rate 88 bpm  Other past medical history reviewed Zio monitor 01/2018 Paroxysmal atrial fibrillation occurred (6% burden), ranging from 48-171 bpm (avg of 84 bpm), the longest lasting 17 hours 41 mins with an avg rate of 84 bpm. 34 Supraventricular Tachycardia/atrial tachycardia runs occurred, the run with the fastest interval lasting 8 beats with a max rate of 171 bpm, the longest lasting 12 beats with an avg rate of 93 bpm.  Isolated SVEs were rare  (<1.0%), SVE Couplets were rare (<1.0%), and SVE Triplets were rare (<1.0%). Isolated VEs were rare (<1.0%), and no VE Couplets or VE Triplets were present. Ventricular Bigeminy was present.  Previous retinal tear, needed laser treatment Concerned it was brought on by the Coumadin, ophthalmology did not think so per the patient  Previous stress test again reviewed with him showing no ischemia Holter monitor reviewed with him showing APCs, PVCs, very short runs of atrial tachycardia up to 6 beats   PMH:   has a past medical history of A-fib ([place]), BPH (benign prostatic hyperplasia), Coronary artery disease, Depression, GERD (gastroesophageal reflux disease), Hyperlipidemia, Hypertension, IBS (irritable bowel syndrome), IBS (irritable bowel syndrome), PNA (pneumonia), Skin cancer, and Stroke ([place]).  PSH:    Past Surgical History:  Procedure Laterality Date  . ADENOIDECTOMY  1951  . CARDIAC CATHETERIZATION  2006   X1 STENT  . SKIN CANCER EXCISION N/A 08/2016   Forehead.  [facility] (Dr. Deliah Boston)  . THYROIDECTOMY, PARTIAL      Current Outpatient Medications  Medication Sig Dispense Refill  . aspirin 81 MG tablet Take 81 mg by mouth daily.    . bisoprolol (ZEBETA) 10 MG tablet Take 1 tablet (10 mg total) by mouth daily. 90 tablet 3  . Cholecalciferol (VITAMIN D) 2000 UNITS CAPS Take 2,000 Units by mouth 2 (two) times daily.     . Coenzyme Q10 (CO Q 10 PO) Take 300 mg by mouth at bedtime.     Marland Kitchen  dicyclomine (BENTYL) 10 MG capsule TAKE 1 CAPSULE (10 MG TOTAL) BY MOUTH 2 (TWO) TIMES DAILY. 90 capsule 0  . DULoxetine (CYMBALTA) 20 MG capsule Take 40 mg by mouth daily.    . Evolocumab (REPATHA SURECLICK) 165 MG/ML SOAJ Inject 140 mg into the skin every 14 (fourteen) days. 2 pen 0  . hydrochlorothiazide (HYDRODIURIL) 12.5 MG tablet TAKE 1 TABLET BY MOUTH EVERY DAY 90 tablet 3  . lisinopril (PRINIVIL,ZESTRIL) 20 MG tablet TAKE 1 TABLET BY MOUTH TWICE A DAY 180 tablet 3  .  nitroGLYCERIN (NITROSTAT) 0.4 MG SL tablet Place 1 tablet (0.4 mg total) every 5 (five) minutes as needed under the tongue for chest pain. 25 tablet 3  . ranitidine (ZANTAC) 150 MG tablet TAKE 1 TABLET (150 MG TOTAL) BY MOUTH 2 (TWO) TIMES DAILY. 180 tablet 4  . triamcinolone cream (KENALOG) 0.5 % Apply 1 application topically 2 (two) times daily as needed. For rash 30 g 0  . vitamin B-12 (CYANOCOBALAMIN) 1000 MCG tablet Take 1,000 mcg by mouth daily.    Marland Kitchen warfarin (COUMADIN) 1 MG tablet Take 1 tablet (1 mg) by mouth each Monday with the warfarin 3.75 mg (1/2 tablet) that you take for total dose of 4.75 mg on Monday. (Patient taking differently: 7.5 mg daily with 1 mg Friday only) 12 tablet 1  . warfarin (COUMADIN) 7.5 MG tablet Take half a tablet by mouth on Monday. Remainder of week 1 tablet daily. 90 tablet 3   No current facility-administered medications for this visit.      Allergies:   Bactrim [sulfamethoxazole-trimethoprim] and Crestor [rosuvastatin calcium]   Social History:  The patient  reports that he has quit smoking. He has never used smokeless tobacco. He reports that he drinks about 2.0 - 3.0 standard drinks of alcohol per week. He reports that he does not use drugs.   Family History:   family history includes Arthritis in his mother; Cancer in his maternal aunt and mother; Heart Problems in his father; Heart disease in his father, paternal grandfather, and paternal grandmother; Hypertension in his father; Mental illness in his mother; Stroke in his maternal grandmother and paternal uncle; Sudden death in his maternal grandfather.    Review of Systems: Review of Systems  Constitutional: Positive for malaise/fatigue.  Respiratory: Negative.   Cardiovascular: Negative.   Gastrointestinal: Negative.   Musculoskeletal: Negative.   Neurological: Positive for weakness.  Psychiatric/Behavioral: Negative.   All other systems reviewed and are negative.    PHYSICAL EXAM: VS:  BP  120/80 (BP Location: Left Arm, Patient Position: Sitting, Cuff Size: Normal)   Pulse 85   Ht 6\' 1"  (1.854 m)   Wt 189 lb (85.7 kg)   BMI 24.94 kg/m  , BMI Body mass index is 24.94 kg/m. Constitutional:  oriented to person, place, and time. No distress.  HENT:  Head: Grossly normal Eyes:  no discharge. No scleral icterus.  Neck: No JVD, no carotid bruits  Cardiovascular: Irregularly irregular , no murmurs appreciated Pulmonary/Chest: Clear to auscultation bilaterally, no wheezes or rails Abdominal: Soft.  no distension.  no tenderness.  Musculoskeletal: Normal range of motion Neurological:  normal muscle tone. Coordination normal. No atrophy Skin: Skin warm and dry Psychiatric: normal affect, pleasant  Recent Labs: 09/30/2017: ALT 16 12/24/2017: BUN 16; Creatinine, Ser 1.18; Hemoglobin 14.7; Platelets 206.0; Potassium 4.8; Sodium 141; TSH 1.52    Lipid Panel Lab Results  Component Value Date   CHOL 152 09/30/2017   HDL 62.50 09/30/2017  [place] 58 09/30/2017   TRIG 162.0 (H) 09/30/2017      Wt Readings from Last 3 Encounters:  04/22/18 189 lb (85.7 kg)  03/31/18 191 lb 12.8 oz (87 kg)  02/23/18 185 lb (83.9 kg)       ASSESSMENT AND PLAN:  Paroxysmal atrial fibrillation ([place]) - Plan: EKG 12-Lead On anticoagulation In atrial fibrillation on today's visit Unclear if he is symptomatic but he reports having chronic fatigue Previous event monitor showed 6% burden atrial fibrillation His fatigue is daily He does not want antiarrhythmics at this time Prefers to stay with his beta-blocker and anticoagulation  Coronary artery disease involving native coronary artery of native heart without angina pectoris - Plan: EKG 12-Lead Known coronary artery disease, prior stent 10 years ago We previously ordered stress test for chest pain, he did not complete this  Mixed hyperlipidemia - Plan: EKG 12-Lead Cholesterol adequate on Repatha No medication changes made  Essential  hypertension - Plan: EKG 12-Lead Blood pressure is well controlled on today's visit. No changes made to the medications. Stable  Cerebrovascular accident (CVA) due to embolism of precerebral artery (HCC) -  Maintain on anticoagulation Denies any bleeding  Encounter for anticoagulation discussion and counseling - Plan: EKG 12-Lead Previous fall, trauma to his left eye with ecchymosis Recommended he stopped drinking No recent falls since last clinic visit  Alcohol abuse  Alcohol cessation recommended, likely contributing to depression, poor sleep hygiene  Depression Chronic fatigue Seen by psychology On Cymbalta Still having alcohol issues, depression Poor sleep  Long discussion with him concerning his chronic fatigue, options for sleep apnea management, options for atrial fibrillation, various antiarrhythmics, ways to save money on his medications through goodrx.com, several coupons printed out for him for sleep aids  Total encounter time more than 45 minutes  Greater than 50% was spent in counseling and coordination of care with the patient   Disposition:   F/U  6 months   Orders Placed This Encounter  Procedures  . EKG 12-Lead     Signed, Esmond Plants, M.D., Ph.D. 04/22/2018  [place], [place]

## 2018-04-22 ENCOUNTER — Ambulatory Visit (INDEPENDENT_AMBULATORY_CARE_PROVIDER_SITE_OTHER): Payer: PPO | Admitting: Cardiovascular Disease

## 2018-04-22 ENCOUNTER — Encounter: Payer: Self-pay | Admitting: Cardiovascular Disease

## 2018-04-22 VITALS — BP 120/80 | HR 85 | Ht 73.0 in | Wt 189.0 lb

## 2018-04-22 DIAGNOSIS — R0609 Other forms of dyspnea: Secondary | ICD-10-CM

## 2018-04-22 DIAGNOSIS — E782 Mixed hyperlipidemia: Secondary | ICD-10-CM | POA: Diagnosis not present

## 2018-04-22 DIAGNOSIS — R079 Chest pain, unspecified: Secondary | ICD-10-CM | POA: Diagnosis not present

## 2018-04-22 DIAGNOSIS — I480 Paroxysmal atrial fibrillation: Secondary | ICD-10-CM | POA: Diagnosis not present

## 2018-04-22 DIAGNOSIS — I25118 Atherosclerotic heart disease of native coronary artery with other forms of angina pectoris: Secondary | ICD-10-CM | POA: Diagnosis not present

## 2018-04-22 DIAGNOSIS — I10 Essential (primary) hypertension: Secondary | ICD-10-CM | POA: Diagnosis not present

## 2018-04-22 DIAGNOSIS — I6310 Cerebral infarction due to embolism of unspecified precerebral artery: Secondary | ICD-10-CM | POA: Diagnosis not present

## 2018-04-22 NOTE — Patient Instructions (Signed)

## 2018-04-25 ENCOUNTER — Telehealth: Payer: Self-pay | Admitting: Pulmonary Disease

## 2018-04-25 NOTE — Telephone Encounter (Signed)
Patient calling back, stating pharmacy suggesting eszopiclone 3 mg instead of Lunesta.  States has to be 3 mg for it to be covered.  Pharm is [facility] in [place].  CB is [phone].

## 2018-04-25 NOTE — Telephone Encounter (Signed)
AO please advise, patients pharmacy has stated that we can call in eszopiclone 3mg  and this would be cheaper for him. It has to be 3mg  though.   Please advise, thank you.

## 2018-04-25 NOTE — Telephone Encounter (Signed)
Called and spoke with patient, he stated that he was on the CPAP machine for a few weeks now. He is still having issues even with using the CPAP machine. He is wanting to know what he could do.   Patient also stated that he was given a Good RX card that will make his medications cheaper. He would like to have something called like before, however he does not want Lunesta.   AO please advise, thank you.

## 2018-04-28 ENCOUNTER — Ambulatory Visit (INDEPENDENT_AMBULATORY_CARE_PROVIDER_SITE_OTHER): Payer: PPO | Admitting: General Practice

## 2018-04-28 DIAGNOSIS — Z8673 Personal history of transient ischemic attack (TIA), and cerebral infarction without residual deficits: Secondary | ICD-10-CM

## 2018-04-28 DIAGNOSIS — Z7901 Long term (current) use of anticoagulants: Secondary | ICD-10-CM | POA: Diagnosis not present

## 2018-04-28 LAB — POCT INR: INR: 2.7 (ref 2.0–3.0)

## 2018-04-28 MED ORDER — ESZOPICLONE 3 MG PO TABS: 3.0000 mg | ORAL_TABLET | Freq: Every day | ORAL | 2 refills | Status: DC

## 2018-04-28 NOTE — Patient Instructions (Addendum)
Pre visit review using our clinic review tool, if applicable. No additional management support is needed unless otherwise documented below in the visit note.  Continue to take 1 tablet daily except 1/2 tablet on Wednesdays and Saturdays.  Re-check 5 weeks.

## 2018-04-28 NOTE — Telephone Encounter (Signed)
Patient calling again about RX for eszopiclone, CB is [phone].

## 2018-04-28 NOTE — Telephone Encounter (Signed)
As per message 3 days ago by Della Goo  Eszopiclone 3 mg p.o. Nightly is okay to call in  Alternative will be zolpidem 6.25 mg (extended release) nightly

## 2018-04-28 NOTE — Telephone Encounter (Signed)
Called and spoke with the patient I let him know that I phoned in the medication and he verbalized understanding nothing further is needed at this time.

## 2018-04-28 NOTE — Telephone Encounter (Signed)
Called and spoke with patient and advise him AO would look at this today and we would give him a call back

## 2018-04-29 ENCOUNTER — Other Ambulatory Visit: Payer: Self-pay | Admitting: Pharmacy Technician

## 2018-04-29 NOTE — Patient Outreach (Signed)
[facility]) Care Management  04/29/2018  Cha Gomillion 02-21-1946 [id number]    Unsuccessful call in reference to 2020 patient assistance application through [place] for [place].  HIPAA compliant voicemail left for a return call.  Shir Bergman P. Arbor Leer, [place] Management [phone]

## 2018-05-02 ENCOUNTER — Encounter: Payer: Self-pay | Admitting: Pharmacy Technician

## 2018-05-02 ENCOUNTER — Other Ambulatory Visit: Payer: Self-pay | Admitting: Pharmacy Technician

## 2018-05-02 NOTE — Patient Outreach (Signed)
[facility]) Care Management  05/02/2018  [name] 01/15/1946 [id number]   Incoming call received from patient. Patient was returning a call that I left for him. HIPAA identifiers verified.   Patient informed me that we was interested in applying for 2020 patient assistance for Repatha through [place].  Informed patient that the application would be mailed out to him in a [employer] envelope. Informed patient to fill out highlighted areas, submit documents required and to mail back to me using the enclosed envelope. Patient verbalized understanding.   Prepared patient portion to be mailed. Faxed provider portion to his new doctor, NP Alma Friendly at [facility].  Will followup with patient in 10-14 business days to confirm he received the application.  Inara Dike P. Johnnathan Hagemeister, [place] Management [phone]

## 2018-05-09 ENCOUNTER — Other Ambulatory Visit: Payer: Self-pay | Admitting: Family Medicine

## 2018-05-09 ENCOUNTER — Other Ambulatory Visit: Payer: Self-pay | Admitting: Pharmacy Technician

## 2018-05-09 NOTE — Patient Outreach (Signed)
[facility]) Care Management  05/09/2018  Collen Vincent 1945-07-06 [id number]    Incoming call received from patient regarding his 2020 patient assistance application for Repatha thru [place]. HIPAA identifiers obtained & verified.   Patient called to inquire if [employer] required his proof of income. Informed patient that the application states that it may be needed and if he felt comfortable with providing it then it may help provide a determination faster. Patient also inquired if he could drop this by the provider's office. Informed him to drop it by [employer] b/c that is where [place] goes once a week. Patient verbalized understanding all information provided him. He states he will get the information turned in this week.  Will followup in 7-10 business days if application has not been received.  Alicya Bena P. Jerney Baksh, [place] Management [phone]

## 2018-05-16 ENCOUNTER — Other Ambulatory Visit: Payer: Self-pay | Admitting: Primary Care

## 2018-05-16 DIAGNOSIS — G4733 Obstructive sleep apnea (adult) (pediatric): Secondary | ICD-10-CM | POA: Diagnosis not present

## 2018-05-16 DIAGNOSIS — Z7901 Long term (current) use of anticoagulants: Secondary | ICD-10-CM

## 2018-05-16 DIAGNOSIS — K589 Irritable bowel syndrome without diarrhea: Secondary | ICD-10-CM

## 2018-05-16 DIAGNOSIS — I4891 Unspecified atrial fibrillation: Secondary | ICD-10-CM

## 2018-05-21 DIAGNOSIS — G4733 Obstructive sleep apnea (adult) (pediatric): Secondary | ICD-10-CM | POA: Diagnosis not present

## 2018-05-23 ENCOUNTER — Other Ambulatory Visit: Payer: Self-pay | Admitting: Pharmacy Technician

## 2018-05-23 ENCOUNTER — Telehealth: Payer: Self-pay | Admitting: Cardiovascular Disease

## 2018-05-23 NOTE — Telephone Encounter (Signed)
Form received and completed and signed by Dr Rockey Situ. Faxed completed form back to [phone].

## 2018-05-23 NOTE — Patient Outreach (Signed)
[facility]) Care Management  05/23/2018  Chief Walkup [birth year] [id number]    Care coordination call placed to Dr. Roney [place] office in regards to [place] patient assistance application for [place].  Spoke to [place] who took a message concerning the need for the provider to fax back the provider's portion of the application. Anderson Malta informed me that she would send a message back to his team.  Will followup in 2-3 business days if fax/call not received back.  Kila Godina P. Cathie Bonnell, [place] Management [phone]

## 2018-05-23 NOTE — Telephone Encounter (Signed)
Spoke with Sharee Pimple with [place]. I could not locate paperwork at this time. She is going to refax it to me now.

## 2018-05-23 NOTE — Telephone Encounter (Signed)
Pt c/o medication issue:  1. Name of Medication: Repatha   2. How are you currently taking this medication (dosage and times per day)?   3. Are you having a reaction (difficulty breathing--STAT)? No   4. What is your medication issue? THN still waiting on provider portion of mediation assistance form to be return faxed to [phone]

## 2018-05-25 ENCOUNTER — Other Ambulatory Visit: Payer: Self-pay | Admitting: Pharmacy Technician

## 2018-05-25 NOTE — Patient Outreach (Signed)
[facility]) Care Management  05/25/2018  [name] March 15, 1946 [id number]   Received all necessary documents and signatures for [employer] patient assistance for Repatha from both provider and patient.  Submitted completed application to [employer] via fax.  Will followup in 7-14 business days to see if a determination has been made.  [name] P. [name], [place] Management [phone]

## 2018-05-27 ENCOUNTER — Ambulatory Visit (INDEPENDENT_AMBULATORY_CARE_PROVIDER_SITE_OTHER): Payer: PPO | Admitting: Family Medicine

## 2018-05-27 ENCOUNTER — Encounter: Payer: Self-pay | Admitting: Family Medicine

## 2018-05-27 ENCOUNTER — Other Ambulatory Visit (INDEPENDENT_AMBULATORY_CARE_PROVIDER_SITE_OTHER): Payer: PPO

## 2018-05-27 VITALS — BP 146/82 | HR 88 | Temp 97.4°F | Ht 73.0 in | Wt 191.5 lb

## 2018-05-27 DIAGNOSIS — R7309 Other abnormal glucose: Secondary | ICD-10-CM

## 2018-05-27 DIAGNOSIS — R5382 Chronic fatigue, unspecified: Secondary | ICD-10-CM

## 2018-05-27 DIAGNOSIS — R1084 Generalized abdominal pain: Secondary | ICD-10-CM | POA: Diagnosis not present

## 2018-05-27 DIAGNOSIS — I10 Essential (primary) hypertension: Secondary | ICD-10-CM

## 2018-05-27 DIAGNOSIS — R109 Unspecified abdominal pain: Secondary | ICD-10-CM | POA: Insufficient documentation

## 2018-05-27 LAB — CBC WITH DIFFERENTIAL/PLATELET
Basophils Absolute: 0 10*3/uL (ref 0.0–0.1)
Basophils Relative: 0.6 % (ref 0.0–3.0)
Eosinophils Absolute: 0.1 10*3/uL (ref 0.0–0.7)
Eosinophils Relative: 2.5 % (ref 0.0–5.0)
HCT: 44.5 % (ref 39.0–52.0)
Hemoglobin: 15.1 g/dL (ref 13.0–17.0)
Lymphocytes Relative: 29.9 % (ref 12.0–46.0)
Lymphs Abs: 1.3 10*3/uL (ref 0.7–4.0)
MCHC: 33.9 g/dL (ref 30.0–36.0)
MCV: 93.8 fl (ref 78.0–100.0)
Monocytes Absolute: 0.4 10*3/uL (ref 0.1–1.0)
Monocytes Relative: 8.6 % (ref 3.0–12.0)
Neutro Abs: 2.6 10*3/uL (ref 1.4–7.7)
Neutrophils Relative %: 58.4 % (ref 43.0–77.0)
Platelets: 193 10*3/uL (ref 150.0–400.0)
RBC: 4.74 Mil/uL (ref 4.22–5.81)
RDW: 14.2 % (ref 11.5–15.5)
WBC: 4.4 10*3/uL (ref 4.0–10.5)

## 2018-05-27 LAB — COMPREHENSIVE METABOLIC PANEL
ALT: 20 U/L (ref 0–53)
AST: 20 U/L (ref 0–37)
Albumin: 4 g/dL (ref 3.5–5.2)
Alkaline Phosphatase: 76 U/L (ref 39–117)
BUN: 17 mg/dL (ref 6–23)
CO2: 33 mEq/L — ABNORMAL HIGH (ref 19–32)
Calcium: 9.6 mg/dL (ref 8.4–10.5)
Chloride: 103 mEq/L (ref 96–112)
Creatinine, Ser: 1.23 mg/dL (ref 0.40–1.50)
GFR: 61.44 mL/min (ref >60.00)
Glucose, Bld: 218 mg/dL — ABNORMAL HIGH (ref 70–99)
POTASSIUM: 4.4 meq/L (ref 3.5–5.1)
Sodium: 141 mEq/L (ref 135–145)
Total Bilirubin: 0.6 mg/dL (ref 0.2–1.2)
Total Protein: 7.2 g/dL (ref 6.0–8.3)

## 2018-05-27 LAB — HEMOGLOBIN A1C: Hgb A1c MFr Bld: 5.5 % (ref 4.6–6.5)

## 2018-05-27 MED ORDER — BISOPROLOL FUMARATE 10 MG PO TABS: 15.0000 mg | ORAL_TABLET | Freq: Every day | ORAL | 0 refills | Status: DC

## 2018-05-27 NOTE — Assessment & Plan Note (Signed)
BP: (!) 146/82    Has been elevated for last 1-2 weeks per pt  Also in a fib (rate controlled) - does not feel palpitations currently  Will inc his bisoprolol to 15 mg (watching bp and pulse carefully)  inst to alert [place] if no improvement in headache  F/u with pcp in 1-2 weeks Rev past hx and medications

## 2018-05-27 NOTE — Assessment & Plan Note (Signed)
Still very fatigued  He plans on having cpap settings checked with px provider

## 2018-05-27 NOTE — Progress Notes (Signed)
Subjective:    Patient ID: [name], male    DOB: [birth year], 72 y.o.   MRN: [id number]  HPI 72 yo pt of NP Clark here for bp check  C/o headache and nausea   Lack of energy  Some rumbling on on L side of abdomen (a week)  Low grade headache No vomiting but is nauseated   No contacts with stomach bug No change in bowel habits   colonosc 2014  Some diverticuli   pmhx includes CAD and a fib and HTN  Takes warfarin   Wt Readings from Last 3 Encounters:  05/27/18 191 lb 8 oz (86.9 kg)  04/22/18 189 lb (85.7 kg)  03/31/18 191 lb 12.8 oz (87 kg)   25.27 kg/m   BP Readings from Last 3 Encounters:  05/27/18 (!) 146/82  04/22/18 120/80  03/31/18 128/72  was 150/94 yesterday   Takes bisoprolol 10 mg  hctz 20 mg   Unsure if he is in a fib today  Lab Results  Component Value Date   INR 2.7 04/28/2018   INR 2.5 03/24/2018   INR 3.2 (A) 02/24/2018     Pulse Readings from Last 3 Encounters:  05/27/18 88  04/22/18 85  03/31/18 97   Lab Results  Component Value Date   CREATININE 1.18 12/24/2017   BUN 16 12/24/2017   NA 141 12/24/2017   K 4.8 12/24/2017   CL 104 12/24/2017   CO2 31 12/24/2017   Lab Results  Component Value Date   WBC 5.0 12/24/2017   HGB 14.7 12/24/2017   HCT 43.6 12/24/2017   MCV 94.1 12/24/2017   PLT 206.0 12/24/2017   Lab Results  Component Value Date   ALT 16 09/30/2017   AST 20 09/30/2017   ALKPHOS 74 09/30/2017   BILITOT 0.5 09/30/2017    Lab Results  Component Value Date   TSH 1.52 12/24/2017     Patient Active Problem List   Diagnosis Date Noted  . Abdominal pain 05/27/2018  . Long term (current) use of anticoagulants 11/19/2017  . Wrist pain, acute, right 11/05/2017  . Sleep apnea 09/16/2017  . Encounter for current long-term use of anticoagulants 07/24/2017  . Fall 07/21/2017  . Constipation 07/21/2017  . Chronic fatigue 06/18/2017  . Tremor 06/18/2017  . Eczema 06/18/2017  . Testicular atrophy 04/23/2017   . Dyspnea on exertion 03/16/2017  . Erectile dysfunction 03/05/2017  . Preventative health care 02/11/2016  . Palpitations 12/09/2015  . Anxiety and depression 02/08/2015  . GERD (gastroesophageal reflux disease) 02/08/2015  . IBS (irritable bowel syndrome) 02/08/2015  . Essential hypertension 02/08/2015  . A-fib ([place]) 01/23/2015  . CAD (coronary artery disease) 01/23/2015  . Hyperlipidemia 01/23/2015  . History of CVA (cerebrovascular accident) 01/23/2015   Past Medical History:  Diagnosis Date  . A-fib ([place])   . BPH (benign prostatic hyperplasia)   . Coronary artery disease    Hx of MI; S/P PCI  . Depression   . GERD (gastroesophageal reflux disease)   . Hyperlipidemia   . Hypertension   . IBS (irritable bowel syndrome)   . IBS (irritable bowel syndrome)   . PNA (pneumonia)   . Skin cancer   . Stroke [facility])    Past Surgical History:  Procedure Laterality Date  . ADENOIDECTOMY  1951  . CARDIAC CATHETERIZATION  2006   X1 STENT  . SKIN CANCER EXCISION N/A 08/2016   Forehead.  [facility] (Dr. Deliah Boston)  . THYROIDECTOMY, PARTIAL  Social History   Tobacco Use  . Smoking status: Former Research scientist (life sciences)  . Smokeless tobacco: Never Used  Substance Use Topics  . Alcohol use: Yes    Alcohol/week: 2.0 - 3.0 standard drinks    Types: 2 - 3 Glasses of wine per week    Comment: Wine daily with dinner  . Drug use: No   Family History  Problem Relation Age of Onset  . Heart Problems Father   . Heart disease Father   . Hypertension Father   . Mental illness Mother   . Arthritis Mother   . Cancer Mother        ovarian  . Stroke Paternal Uncle   . Heart disease Paternal Grandfather   . Cancer Maternal Aunt   . Stroke Maternal Grandmother   . Sudden death Maternal Grandfather   . Heart disease Paternal Grandmother    Allergies  Allergen Reactions  . Bactrim [Sulfamethoxazole-Trimethoprim] Anaphylaxis    Ulcers  . Crestor [Rosuvastatin Calcium]     Severe  Myalgias   Current Outpatient Medications on File Prior to Visit  Medication Sig Dispense Refill  . aspirin 81 MG tablet Take 81 mg by mouth daily.    . Cholecalciferol (VITAMIN D) 2000 UNITS CAPS Take 2,000 Units by mouth 2 (two) times daily.     . Coenzyme Q10 (CO Q 10 PO) Take 300 mg by mouth at bedtime.     . dicyclomine (BENTYL) 10 MG capsule Take 1 capsule (10 mg total) by mouth 2 (two) times daily with a meal. As needed. 180 capsule 0  . DULoxetine (CYMBALTA) 20 MG capsule Take 40 mg by mouth daily.    . Eszopiclone 3 MG TABS Take 1 tablet (3 mg total) by mouth at bedtime. Take immediately before bedtime 30 tablet 2  . Evolocumab (REPATHA SURECLICK) 580 MG/ML SOAJ Inject 140 mg into the skin every 14 (fourteen) days. 2 pen 0  . hydrochlorothiazide (HYDRODIURIL) 12.5 MG tablet TAKE 1 TABLET BY MOUTH EVERY DAY 90 tablet 3  . lisinopril (PRINIVIL,ZESTRIL) 20 MG tablet TAKE 1 TABLET BY MOUTH TWICE A DAY 180 tablet 3  . nitroGLYCERIN (NITROSTAT) 0.4 MG SL tablet Place 1 tablet (0.4 mg total) every 5 (five) minutes as needed under the tongue for chest pain. 25 tablet 3  . ranitidine (ZANTAC) 150 MG tablet TAKE 1 TABLET (150 MG TOTAL) BY MOUTH 2 (TWO) TIMES DAILY. 180 tablet 4  . triamcinolone cream (KENALOG) 0.5 % Apply 1 application topically 2 (two) times daily as needed. For rash 30 g 0  . vitamin B-12 (CYANOCOBALAMIN) 1000 MCG tablet Take 1,000 mcg by mouth daily.    Marland Kitchen warfarin (COUMADIN) 7.5 MG tablet TAKE 1.5 TABLETS BY MOUTH ON MONDAY AND FRIDAY. REMAINDER OF WEEK 1 TABLET DAILY. 96 tablet 1   No current facility-administered medications on file prior to visit.      Review of Systems  Constitutional: Positive for fatigue. Negative for activity change, appetite change, fever and unexpected weight change.  HENT: Negative for congestion, rhinorrhea, sore throat and trouble swallowing.   Eyes: Negative for pain, redness, itching and visual disturbance.  Respiratory: Negative for cough,  chest tightness, shortness of breath and wheezing.   Cardiovascular: Negative for chest pain, palpitations and leg swelling.  Gastrointestinal: Positive for abdominal pain and nausea. Negative for abdominal distention, anal bleeding, blood in stool, constipation, diarrhea, rectal pain and vomiting.       Abd gurgling  Mild pain in abdomen -off/on -worse on L  Endocrine: Negative for cold intolerance, heat intolerance, polydipsia and polyuria.  Genitourinary: Negative for difficulty urinating, dysuria, flank pain, frequency, hematuria and urgency.  Musculoskeletal: Negative for arthralgias, joint swelling and myalgias.  Skin: Negative for pallor and rash.  Neurological: Positive for headaches. Negative for dizziness, tremors, seizures, syncope, facial asymmetry, speech difficulty, weakness, light-headedness and numbness.  Hematological: Negative for adenopathy. Does not bruise/bleed easily.  Psychiatric/Behavioral: Negative for decreased concentration and dysphoric mood. The patient is not nervous/anxious.        Objective:   Physical Exam Constitutional:      General: He is not in acute distress.    Appearance: He is well-developed and normal weight. He is not ill-appearing, toxic-appearing or diaphoretic.  HENT:     Head: Normocephalic and atraumatic.     Mouth/Throat:     Mouth: Mucous membranes are moist.  Eyes:     General: No scleral icterus.    Conjunctiva/sclera: Conjunctivae normal.     Pupils: Pupils are equal, round, and reactive to light.  Neck:     Musculoskeletal: Normal range of motion and neck supple.  Cardiovascular:     Rate and Rhythm: Normal rate.     Heart sounds: Normal heart sounds.     Comments: irreg irreg rhythm  Pulmonary:     Effort: Pulmonary effort is normal. No respiratory distress.     Breath sounds: Normal breath sounds. No wheezing, rhonchi or rales.  Chest:     Chest wall: No tenderness.  Abdominal:     General: Bowel sounds are normal. There  is no distension.     Palpations: Abdomen is soft. There is no shifting dullness, hepatomegaly, splenomegaly, mass or pulsatile mass.     Tenderness: There is generalized abdominal tenderness. There is no right CVA tenderness, left CVA tenderness, guarding or rebound. Negative signs include Murphy's sign and McBurney's sign.     Comments: Tenderness is worse in L mid abdomen   Musculoskeletal:        General: No swelling.  Lymphadenopathy:     Cervical: No cervical adenopathy.  Skin:    General: Skin is warm and dry.     Capillary Refill: Capillary refill takes less than 2 seconds.     Coloration: Skin is not pale.     Findings: No erythema.  Neurological:     General: No focal deficit present.     Mental Status: He is alert.  Psychiatric:        Mood and Affect: Affect is flat. Affect is not blunt.     Comments: flat affect Wife is helpful with history            Assessment & Plan:   Problem List Items Addressed This Visit      Cardiovascular and Mediastinum   Essential hypertension - Primary    BP: (!) 146/82    Has been elevated for last 1-2 weeks per pt  Also in a fib (rate controlled) - does not feel palpitations currently  Will inc his bisoprolol to 15 mg (watching bp and pulse carefully)  inst to alert [place] if no improvement in headache  F/u with pcp in 1-2 weeks Rev past hx and medications       Relevant Medications   bisoprolol (ZEBETA) 10 MG tablet   Other Relevant Orders   CBC with Differential/Platelet   Comprehensive metabolic panel     Other   Chronic fatigue    Still very fatigued  He plans on having cpap  settings checked with px provider      Abdominal pain    Vague intermittent abd pain "and bubbly feeling" - unsure how long but worse in past week  No stool changes or vomiting/some nausea  Rev last colonoscopy 2014- some diverticulosis  Mild L sided (mid) tenderness on exam w/o rebound or guarding Cbc and cmet today  Doubt diverticulitis  given mild symptoms Inst to take fiber supplement and avoid nuts and seeds   inst to call and seek care asap if symptoms worsen however       Relevant Orders   CBC with Differential/Platelet   Comprehensive metabolic panel

## 2018-05-27 NOTE — Assessment & Plan Note (Signed)
Vague intermittent abd pain "and bubbly feeling" - unsure how long but worse in past week  No stool changes or vomiting/some nausea  Rev last colonoscopy 2014- some diverticulosis  Mild L sided (mid) tenderness on exam w/o rebound or guarding Cbc and cmet today  Doubt diverticulitis given mild symptoms Inst to take fiber supplement and avoid nuts and seeds   inst to call and seek care asap if symptoms worsen however

## 2018-05-27 NOTE — Patient Instructions (Addendum)
Labs today (for abdominal pain)  Let's increase your zebeta to 1 1/2 pills per day - for blood pressure   Make sure to eat lots of fiber in diet (or a fiber supplement -metamucil) Drink lots of water  Avoid nuts/seeds for diverticulosis   If abdominal pain increases please let us know   Follow up with Anda Kraft in 1-2 weeks

## 2018-06-06 ENCOUNTER — Other Ambulatory Visit: Payer: Self-pay | Admitting: Pharmacy Technician

## 2018-06-06 ENCOUNTER — Telehealth: Payer: Self-pay | Admitting: Cardiovascular Disease

## 2018-06-06 DIAGNOSIS — I480 Paroxysmal atrial fibrillation: Secondary | ICD-10-CM

## 2018-06-06 NOTE — Telephone Encounter (Signed)
Patient c/o Palpitations:  High priority if patient c/o lightheadedness, shortness of breath, or chest pain  1) How long have you had palpitations/irregular HR/ Afib? Are you having the symptoms now?  Started conversion today . Yes   2) Are you currently experiencing lightheadedness, SOB or CP? Sob on exertion   3) Do you have a history of afib (atrial fibrillation) or irregular heart rhythm? yes  4) Have you checked your BP or HR? (document readings if available): no   5) Are you experiencing any other symptoms? Tired

## 2018-06-06 NOTE — Telephone Encounter (Signed)
In addition to his current medications would add amiodarone 400 twice daily for 5 days then down to 200 twice daily for 2 weeks then down to 200 daily He will need to have INR checked within 1 week of starting amiodarone as it can push it high

## 2018-06-06 NOTE — Patient Outreach (Signed)
[facility]) Care Management  06/06/2018  [name] [birth year] [id number]   Care coordination call placed to [place] patient assistance in regards to patient's [place].  Spoke to [place] who said the patient's application was in the pending stage. He informed that the provider would have to submit a new PA for 2020 as the current one expires on 06/07/2018. THEN once the health insurance has made a determination then a copy of the letter would have to be submitted to [employer] before a final determination would be made.  Will followup with patient's provider in 3-5 business days to start the 2020 PA process.  [name] P. [name], [place] Management [phone]

## 2018-06-06 NOTE — Telephone Encounter (Signed)
Pt reports he "felt himself go into a fib" yesterday. Is having lightheadedness, exertional SOB and pounding in the L side of his neck. Has "slept 24 hours" over the past 2 days.  He reports that he spoke with [place] at last visit about possible medication change that would help with a fib.  Routed to [facility] for review.

## 2018-06-07 MED ORDER — AMIODARONE HCL 200 MG PO TABS: ORAL_TABLET | ORAL | 3 refills | Status: DC

## 2018-06-07 NOTE — Telephone Encounter (Signed)
Call to patient with instructions per [facility], start on new medication, 7 days later go to [facility] for lab draw.   Orders placed in Epic. Advised pt to call for any further questions or concerns

## 2018-06-09 ENCOUNTER — Other Ambulatory Visit: Payer: Self-pay | Admitting: Pharmacy Technician

## 2018-06-09 NOTE — Patient Outreach (Signed)
[facility]) Care Management  06/09/2018  [name] 1946/03/16 [id number]    Care coordination in basket message sent to [facility] at Dr. Donivan Scull office in reference to [name] patient assistance for [name].  [employer] requires that a PA be submiited to patient's insurance for [name] and the resulting letter needs to be faxed to [place] for patient assistance to proceed. Sent inbasket message to the dr office requesting this process be completed in order to proceed with patient assistance.  Will followup with provider office in 5-7 business days to see if they have received a PA determination.  Barett Whidbee P. Yarel Kilcrease, [place] Management [phone]

## 2018-06-13 ENCOUNTER — Ambulatory Visit (INDEPENDENT_AMBULATORY_CARE_PROVIDER_SITE_OTHER): Payer: PPO | Admitting: Primary Care

## 2018-06-13 ENCOUNTER — Encounter: Payer: Self-pay | Admitting: Primary Care

## 2018-06-13 ENCOUNTER — Telehealth: Payer: Self-pay

## 2018-06-13 VITALS — BP 134/82 | HR 61 | Temp 97.5°F | Ht 73.0 in | Wt 192.0 lb

## 2018-06-13 DIAGNOSIS — R0602 Shortness of breath: Secondary | ICD-10-CM | POA: Diagnosis not present

## 2018-06-13 DIAGNOSIS — I10 Essential (primary) hypertension: Secondary | ICD-10-CM | POA: Diagnosis not present

## 2018-06-13 DIAGNOSIS — J449 Chronic obstructive pulmonary disease, unspecified: Secondary | ICD-10-CM | POA: Diagnosis not present

## 2018-06-13 MED ORDER — BUDESONIDE-FORMOTEROL FUMARATE 160-4.5 MCG/ACT IN AERO: 2.0000 | INHALATION_SPRAY | Freq: Two times a day (BID) | RESPIRATORY_TRACT | 3 refills | Status: DC

## 2018-06-13 MED ORDER — ALBUTEROL SULFATE (2.5 MG/3ML) 0.083% IN NEBU
2.5000 mg | INHALATION_SOLUTION | Freq: Once | RESPIRATORY_TRACT | Status: AC
  Administered 2018-06-13: 2.5 mg via RESPIRATORY_TRACT

## 2018-06-13 MED ORDER — ALBUTEROL SULFATE HFA 108 (90 BASE) MCG/ACT IN AERS: 2.0000 | INHALATION_SPRAY | RESPIRATORY_TRACT | 0 refills | Status: DC | PRN

## 2018-06-13 NOTE — Telephone Encounter (Signed)
Pt was seen earlier today and pt said he left [facility] and both meds the symbicort and albuterol inhaler is too expensive and request substitute meds sent to [facility] that would be more affordable.

## 2018-06-13 NOTE — Telephone Encounter (Signed)
Can we call the pharmacy and find out what they may cover?  We need a LABA/ICS combination inhaler. Advair? Anything else? Also with albuterol will they cover? ProAir? Proventil, Ventolin?

## 2018-06-13 NOTE — Telephone Encounter (Signed)
[facility] and was told there is not cheaper than what they were told. Symbicort cost for patient is $80 for a 3 month supply and for albuterol cost is $40 (this cheapest cost that they have).

## 2018-06-13 NOTE — Progress Notes (Signed)
Subjective:    Patient ID: [name], male    DOB: 1946-02-09, 73 y.o.   MRN: [id number]  HPI  [name] is a 73 year old male who presents today for follow up.   He was last evaluated by Dr. Glori Bickers on 05/27/18 for numerous symptoms including vague abdominal pain, chronic fatigue, headache, nausea.   His blood pressure had been elevated over the last 1-2 weeks per patient, also elevated in the office that day. His bisoprolol was increased to 15 mg. He underwent lab work including CBC and CMP. Glucose was elevated at 218 so A1C was done which was 5.5. he was instructed to follow up 2-3 weeks later.  BP Readings from Last 3 Encounters:  06/13/18 134/82  05/27/18 (!) 146/82  04/22/18 120/80   Since his visit on 05/27/18 he's been checking his BP at home which is running 130's/80's. He attributes his nausea and abdominal symptoms to amiodarone.  His main concern his chest tightness and shortness of breath, feels like he's not getting enough air. He continues with chronic fatigue, will walk a short distance and have to rest. He took a shower the other day and had to rest. He doesn't feel as though the CPAP/BiPAP machine is helping. He does follow with cardiology who recently started him on Amiodarone. He is a prior smoker, smoked for 30 years, quit about 12 years ago. He's not had spirometry testing. He denies fevers, cough.   Review of Systems  Constitutional: Positive for fatigue. Negative for fever.  HENT: Positive for congestion.   Respiratory: Positive for shortness of breath. Negative for cough and wheezing.   Cardiovascular: Negative for chest pain.  Gastrointestinal: Negative for abdominal pain.       Past Medical History:  Diagnosis Date  . A-fib ([place])   . BPH (benign prostatic hyperplasia)   . Coronary artery disease    Hx of MI; S/P PCI  . Depression   . GERD (gastroesophageal reflux disease)   . Hyperlipidemia   . Hypertension   . IBS (irritable bowel  syndrome)   . IBS (irritable bowel syndrome)   . PNA (pneumonia)   . Skin cancer   . Stroke [facility])      Social History   Socioeconomic History  . Marital status: Married    Spouse name: Not on file  . Number of children: Not on file  . Years of education: Not on file  . Highest education level: Not on file  Occupational History  . Not on file  Social Needs  . Financial resource strain: Not hard at all  . Food insecurity:    Worry: Never true    Inability: Never true  . Transportation needs:    Medical: No    Non-medical: No  Tobacco Use  . Smoking status: Former Research scientist (life sciences)  . Smokeless tobacco: Never Used  Substance and Sexual Activity  . Alcohol use: Yes    Alcohol/week: 2.0 - 3.0 standard drinks    Types: 2 - 3 Glasses of wine per week    Comment: Wine daily with dinner  . Drug use: No  . Sexual activity: Never  Lifestyle  . Physical activity:    Days per week: Not on file    Minutes per session: Not on file  . Stress: Not on file  Relationships  . Social connections:    Talks on phone: Not on file    Gets together: Not on file    Attends religious service: Not  on file    Active member of club or organization: Not on file    Attends meetings of clubs or organizations: Not on file    Relationship status: Not on file  . Intimate partner violence:    Fear of current or ex partner: No    Emotionally abused: No    Physically abused: No    Forced sexual activity: No  Other Topics Concern  . Not on file  Social History Narrative  . Not on file    Past Surgical History:  Procedure Laterality Date  . ADENOIDECTOMY  1951  . CARDIAC CATHETERIZATION  2006   X1 STENT  . SKIN CANCER EXCISION N/A 08/2016   Forehead.  [facility] (Dr. Deliah Boston)  . THYROIDECTOMY, PARTIAL      Family History  Problem Relation Age of Onset  . Heart Problems Father   . Heart disease Father   . Hypertension Father   . Mental illness Mother   . Arthritis Mother   .  Cancer Mother        ovarian  . Stroke Paternal Uncle   . Heart disease Paternal Grandfather   . Cancer Maternal Aunt   . Stroke Maternal Grandmother   . Sudden death Maternal Grandfather   . Heart disease Paternal Grandmother     Allergies  Allergen Reactions  . Bactrim [Sulfamethoxazole-Trimethoprim] Anaphylaxis    Ulcers  . Crestor [Rosuvastatin Calcium]     Severe Myalgias    Current Outpatient Medications on File Prior to Visit  Medication Sig Dispense Refill  . amiodarone (PACERONE) 200 MG tablet Take 2 tablets (400 mg total) twice daily for 5 days, then take 1 tablet (200 mg total) twice daily for 2 weeks, then take 1 tablet (200 mg) once daily thereafter. 90 tablet 3  . aspirin 81 MG tablet Take 81 mg by mouth daily.    . bisoprolol (ZEBETA) 10 MG tablet Take 1.5 tablets (15 mg total) by mouth daily. 135 tablet 0  . Cholecalciferol (VITAMIN D) 2000 UNITS CAPS Take 2,000 Units by mouth daily.     . Coenzyme Q10 (CO Q 10 PO) Take 300 mg by mouth at bedtime.     . dicyclomine (BENTYL) 10 MG capsule Take 1 capsule (10 mg total) by mouth 2 (two) times daily with a meal. As needed. 180 capsule 0  . DULoxetine (CYMBALTA) 20 MG capsule Take 40 mg by mouth daily.    . Eszopiclone 3 MG TABS Take 1 tablet (3 mg total) by mouth at bedtime. Take immediately before bedtime 30 tablet 2  . Evolocumab (REPATHA SURECLICK) 099 MG/ML SOAJ Inject 140 mg into the skin every 14 (fourteen) days. 2 pen 0  . hydrochlorothiazide (HYDRODIURIL) 12.5 MG tablet TAKE 1 TABLET BY MOUTH EVERY DAY 90 tablet 3  . lisinopril (PRINIVIL,ZESTRIL) 20 MG tablet TAKE 1 TABLET BY MOUTH TWICE A DAY 180 tablet 3  . nitroGLYCERIN (NITROSTAT) 0.4 MG SL tablet Place 1 tablet (0.4 mg total) every 5 (five) minutes as needed under the tongue for chest pain. 25 tablet 3  . ranitidine (ZANTAC) 150 MG tablet TAKE 1 TABLET (150 MG TOTAL) BY MOUTH 2 (TWO) TIMES DAILY. 180 tablet 4  . triamcinolone cream (KENALOG) 0.5 % Apply 1  application topically 2 (two) times daily as needed. For rash 30 g 0  . vitamin B-12 (CYANOCOBALAMIN) 1000 MCG tablet Take 1,000 mcg by mouth daily.    Marland Kitchen warfarin (COUMADIN) 7.5 MG tablet TAKE 1.5 TABLETS BY  MOUTH ON MONDAY AND FRIDAY. REMAINDER OF WEEK 1 TABLET DAILY. 96 tablet 1   No current facility-administered medications on file prior to visit.     BP 134/82   Pulse 61   Temp (!) 97.5 F (36.4 C) (Oral)   Ht 6\' 1"  (1.854 m)   Wt 192 lb (87.1 kg)   SpO2 98%   BMI 25.33 kg/m    Objective:   Physical Exam  Constitutional: He is oriented to person, place, and time. He appears well-nourished.  Neck: Neck supple.  Cardiovascular: Normal rate and regular rhythm.  Respiratory: Effort normal and breath sounds normal. He has no wheezes.  Neurological: He is alert and oriented to person, place, and time.  Skin: Skin is warm and dry.           Assessment & Plan:

## 2018-06-13 NOTE — Patient Instructions (Signed)
Start budesonide-formoterol (Symbicort) inhaler. Start with 1 puff twice daily, may go up to 2 puffs twice daily if needed. Use this one everyday.  Shortness of Breath/Wheezing: Use the albuterol inhaler. Inhale 2 puffs into the lungs every 4 to 6 hours as needed for wheezing, cough, and/or shortness of breath.   Please update me in 2 weeks. Also call me sooner if the inhaler is too expensive.   Follow up with the pulmonologist as scheduled.  It was a pleasure to see you today!

## 2018-06-13 NOTE — Assessment & Plan Note (Signed)
Improved on increased dose of bisoprolol, continue same.

## 2018-06-13 NOTE — Addendum Note (Signed)
Addended by: Jacqualin Combes on: 06/13/2018 03:55 PM   Modules accepted: Orders

## 2018-06-13 NOTE — Assessment & Plan Note (Signed)
Smoker for 30 years, quit 12 years ago. Symptoms of dyspnea, fatigue, chest tightness could very well be secondary to untreated COPD. Spirometry (pre and post bronchodilator) today with moderate obstruction, not significant improvement post treatment.  Will trial Symbicort inhaler, 1 puff BID. Also sent Rx for albuterol inhaler to use PRN. Discussed importance of daily compliance to Symbicort. He will update in 2 weeks.

## 2018-06-13 NOTE — Telephone Encounter (Signed)
Please update patient and notify him that I strongly advise he try the Symbicort. I do think there's a chance that it may improve his symptoms. He is also welcome to call his insurance for other options for COPD inhalers. Let me know what he decides.

## 2018-06-14 ENCOUNTER — Telehealth: Payer: Self-pay | Admitting: Cardiovascular Disease

## 2018-06-14 ENCOUNTER — Other Ambulatory Visit
Admission: RE | Admit: 2018-06-14 | Discharge: 2018-06-14 | Disposition: A | Discharge status: Home / Self Care | Payer: PPO | Source: Ambulatory Visit | Attending: Cardiovascular Disease | Admitting: Cardiovascular Disease

## 2018-06-14 DIAGNOSIS — I480 Paroxysmal atrial fibrillation: Secondary | ICD-10-CM

## 2018-06-14 LAB — PROTIME-INR
INR: 3.19
Prothrombin Time: 32.2 seconds — ABNORMAL HIGH (ref 11.4–15.2)

## 2018-06-14 NOTE — Telephone Encounter (Signed)
Spoken and notified patient of William Millers comments. Patient verbalized understanding. He stated that he will be think about if he will go get the inhalers or not. He will let us know.

## 2018-06-14 NOTE — Telephone Encounter (Signed)
Pt c/o medication issue:  1. Name of Medication: Amiodarone   2. How are you currently taking this medication (dosage and times per day)? 200 mg po BID   3. Are you having a reaction (difficulty breathing--STAT)? SOB chest pressure in center nausea   4. What is your medication issue? ? Reaction symptoms started x 2 days

## 2018-06-14 NOTE — Telephone Encounter (Signed)
Called and spoke with patient. States he's been feeling more and more exhausted over the past several months.  Two days ago he started having chest tightness below his sternum.  He also feels his breathing is more labored with exertion. He saw PCP, Alma Friendly yesterday. HE was prescribed Symbicort inhaler but it was too expensive for him. Recommended he call his insurance company to see if there's another inhaler covered and to touch base with PCP to see if there are any kind of assistance programs.  Offered patient appointment to see Ignacia Bayley, NP tomorrow. Immediately, patient's wife got on the phone and demanded to see the "doctor." She does not want to see anyone else but a professional doctor. Assured her I can look at Dr Donivan Scull schedule as well. He has opening on 06/16/2018 and offered her that appointment.  She is agreeable.  Wife also said she had seen her husband get more and more tired over the past year. Expressed that she did not feel his CPAP machine was working and was actually making it worse as he could not sleep. States they have appointment with pulmonologist (Dr Ander Slade) at the end of the month and will discuss it at that time. Pt verbalized understanding to call 911 or go to the emergency room, if he develops any new or worsening symptoms.

## 2018-06-15 ENCOUNTER — Telehealth: Payer: Self-pay | Admitting: Cardiovascular Disease

## 2018-06-15 ENCOUNTER — Other Ambulatory Visit: Payer: Self-pay | Admitting: Pharmacy Technician

## 2018-06-15 NOTE — Telephone Encounter (Signed)
No answer. Left message to call back to discuss over the phone or can discuss at appointment tomorrow with Dr Rockey Situ that is already scheduled.

## 2018-06-15 NOTE — Telephone Encounter (Signed)
Please call regarding Repatha . Pt is getting patient assitance PA. Would like [place] to start this process.

## 2018-06-15 NOTE — Telephone Encounter (Signed)
[name] from [facility] is calling because they need [place] to submit new prior authorization as the one from last year has expired.  Once prior authorization is approved, AMGEN needs a copy of this.  We can fax to [phone] to [name] or to [facility] directly at [phone].

## 2018-06-15 NOTE — Patient Outreach (Signed)
[facility]) Care Management  06/15/2018  [name] [birth year] [id number]    ADDENDUM  Care coordination call received from [facility] at Dr. Donivan Scull office in reference to patient's [facility] assistance application with Amgen for [place].   Anderson Malta  left a voicemail message stating that even though Repatha was a cardiology medication, it was originally prescribed by Dr. Tommi Rumps. She advised contacting his office to get the PA letter so that it could be submitted to [employer]gen.  Will route note to [place] to assist in the PA process as she goes to this clinic once a week.  Ellamay Fors P. Ernestene Coover, [place] Management [phone]

## 2018-06-15 NOTE — Telephone Encounter (Signed)
Started to do prior authorization on [email or web address]. Realized that ordering prescriber was Dr Caryl Bis, PCP, and not Dr Rockey Situ.  Stopped prior authorization. Called and left message with Sharee Pimple to let her know she will need to reach out to his office.  Will also route to [place] via [employer].

## 2018-06-15 NOTE — Patient Outreach (Signed)
[facility]) Care Management  06/15/2018  [name] May 02, 1946 [id number]    Care coordination call placed to Dr Donivan Scull office in regards to patient's Amgen medication assistance application for Repatha.  Spoke to [place] who took a message to pass along to Dr. Donivan Scull nurse as his nurse varies daily and the previous inbasket may not have been received.  Will followup with provider's office in 5-7 business days if call is not returned.  [name] P. Nichoals Heyde, [place] Management [phone]

## 2018-06-15 NOTE — Progress Notes (Signed)
Cardiology Office Note  Date:  06/16/2018   ID:  [name], DOB 07-13-1945, MRN [id number]  PCP:  Pleas Koch, NP   Chief Complaint  Patient presents with  . other    Chest tightness, SOB and fatigue.Sleeping a lot. Medications reviewed verbally.     HPI:  73 y/o male with a history of  Chronic alcohol abuse,  wine per night 3 to 5+ per night PAF,  prior stroke treated with TPA with mild residual left sided weakness,  Depression with chronic fatigue,  chronic anticoagulation therapy with Coumadin  h/o CAD s/p PCI to the LCx in 2007.  mini stroke in 2015 seen on PET scan, did not have symptoms and was on Coumadin at the time. We previously ordered stress test for chest pain, he did not complete this Presents for f/u of his CAD, new chest pain  Poor sleep , reports that he is working with pulmonary in [place] Eszopiclone, not working  Started on amiodarone for atrial fibrillation Had nausea, reports that nausea improved if he drinks wine  No regular exercise regiment Reports blood pressure stable Sleeps most of the day if he is not working Even after long night sleep is still tired Chronic exhaustion  He reports having abdominal bloating, weight gain More shortness of breath past several weeks  Reports having obstructive sleep apnea and central sleep apnea Tried bipap  Previously reported having weak legs, back discomfort, joint pain Deconditioned at baseline   event monitor showing 1 long stretch of atrial fibrillation lasting over 24 hours otherwise maintain normal sinus rhythm, 6% burden A. Fib  EKG personally reviewed by myself on todays visit Shows normal sinus rhythm with rate 61 bpm no significant ST or T wave changes  Other past medical history reviewed Zio monitor 01/2018 Paroxysmal atrial fibrillation occurred (6% burden), ranging from 48-171 bpm (avg of 84 bpm), the longest lasting 17 hours 41 mins with an avg rate of 84 bpm. 34  Supraventricular Tachycardia/atrial tachycardia runs occurred, the run with the fastest interval lasting 8 beats with a max rate of 171 bpm, the longest lasting 12 beats with an avg rate of 93 bpm.  Isolated SVEs were rare (<1.0%), SVE Couplets were rare (<1.0%), and SVE Triplets were rare (<1.0%). Isolated VEs were rare (<1.0%), and no VE Couplets or VE Triplets were present. Ventricular Bigeminy was present.  Previous retinal tear, needed laser treatment Concerned it was brought on by the Coumadin, ophthalmology did not think so per the patient  Previous stress test again reviewed with him showing no ischemia Holter monitor reviewed with him showing APCs, PVCs, very short runs of atrial tachycardia up to 6 beats   PMH:   has a past medical history of A-fib ([place]), BPH (benign prostatic hyperplasia), Coronary artery disease, Depression, GERD (gastroesophageal reflux disease), Hyperlipidemia, Hypertension, IBS (irritable bowel syndrome), IBS (irritable bowel syndrome), PNA (pneumonia), Skin cancer, and Stroke ([place]).  PSH:    Past Surgical History:  Procedure Laterality Date  . ADENOIDECTOMY  1951  . CARDIAC CATHETERIZATION  2006   X1 STENT  . SKIN CANCER EXCISION N/A 08/2016   Forehead.  [facility] (Dr. Deliah Boston)  . THYROIDECTOMY, PARTIAL      Current Outpatient Medications  Medication Sig Dispense Refill  . amiodarone (PACERONE) 200 MG tablet Take one daily 90 tablet 3  . aspirin 81 MG tablet Take 81 mg by mouth daily.    . bisoprolol (ZEBETA) 10 MG tablet Take 1.5 tablets (  15 mg total) by mouth daily. 135 tablet 0  . Cholecalciferol (VITAMIN D) 2000 UNITS CAPS Take 2,000 Units by mouth daily.     . Coenzyme Q10 (CO Q 10 PO) Take 300 mg by mouth at bedtime.     . dicyclomine (BENTYL) 10 MG capsule Take 1 capsule (10 mg total) by mouth 2 (two) times daily with a meal. As needed. 180 capsule 0  . DULoxetine (CYMBALTA) 20 MG capsule Take 40 mg by mouth daily.    .  Eszopiclone 3 MG TABS Take 1 tablet (3 mg total) by mouth at bedtime. Take immediately before bedtime 30 tablet 2  . Evolocumab (REPATHA SURECLICK) 016 MG/ML SOAJ Inject 140 mg into the skin every 14 (fourteen) days. 2 pen 0  . hydrochlorothiazide (HYDRODIURIL) 12.5 MG tablet Take 1 tablet (12.5 mg total) by mouth daily. 90 tablet 0  . lisinopril (PRINIVIL,ZESTRIL) 20 MG tablet TAKE 1 TABLET BY MOUTH TWICE A DAY 180 tablet 3  . nitroGLYCERIN (NITROSTAT) 0.4 MG SL tablet Place 1 tablet (0.4 mg total) every 5 (five) minutes as needed under the tongue for chest pain. 25 tablet 3  . ranitidine (ZANTAC) 150 MG tablet TAKE 1 TABLET (150 MG TOTAL) BY MOUTH 2 (TWO) TIMES DAILY. 180 tablet 4  . triamcinolone cream (KENALOG) 0.5 % Apply 1 application topically 2 (two) times daily as needed. For rash 30 g 0  . vitamin B-12 (CYANOCOBALAMIN) 1000 MCG tablet Take 1,000 mcg by mouth daily.    Marland Kitchen warfarin (COUMADIN) 7.5 MG tablet TAKE 1.5 TABLETS BY MOUTH ON MONDAY AND FRIDAY. REMAINDER OF WEEK 1 TABLET DAILY. 96 tablet 1  . furosemide (LASIX) 20 MG tablet Take 1 tablet (20 mg total) by mouth daily as needed. 30 tablet 0   No current facility-administered medications for this visit.      Allergies:   Bactrim [sulfamethoxazole-trimethoprim] and Crestor [rosuvastatin calcium]   Social History:  The patient  reports that he has quit smoking. He has never used smokeless tobacco. He reports current alcohol use of about 2.0 - 3.0 standard drinks of alcohol per week. He reports that he does not use drugs.   Family History:   family history includes Arthritis in his mother; Cancer in his maternal aunt and mother; Heart Problems in his father; Heart disease in his father, paternal grandfather, and paternal grandmother; Hypertension in his father; Mental illness in his mother; Stroke in his maternal grandmother and paternal uncle; Sudden death in his maternal grandfather.    Review of Systems: Review of Systems   Constitutional: Positive for malaise/fatigue.  Respiratory: Positive for shortness of breath.   Cardiovascular: Negative.        Abdominal bloating  Gastrointestinal: Negative.   Musculoskeletal: Negative.   Neurological: Positive for weakness.  Psychiatric/Behavioral: Negative.   All other systems reviewed and are negative.    PHYSICAL EXAM: VS:  BP 140/72 (BP Location: Left Arm, Patient Position: Sitting, Cuff Size: Normal)   Pulse 61   Ht 6\' 1"  (1.854 m)   Wt 191 lb (86.6 kg)   SpO2 95%   BMI 25.20 kg/m  , BMI Body mass index is 25.2 kg/m. Constitutional:  oriented to person, place, and time. No distress.  HENT:  Head: Grossly normal Eyes:  no discharge. No scleral icterus.  Neck: No JVD, no carotid bruits  Cardiovascular: Irregularly irregular , no murmurs appreciated Pulmonary/Chest: Clear to auscultation bilaterally, no wheezes or rails Abdominal: Soft.  no distension.  no tenderness.  Musculoskeletal:  Normal range of motion Neurological:  normal muscle tone. Coordination normal. No atrophy Skin: Skin warm and dry Psychiatric: normal affect, pleasant  Recent Labs: 12/24/2017: TSH 1.52 05/27/2018: ALT 20; BUN 17; Creatinine, Ser 1.23; Hemoglobin 15.1; Platelets 193.0; Potassium 4.4; Sodium 141    Lipid Panel Lab Results  Component Value Date   CHOL 152 09/30/2017   HDL 62.50 09/30/2017   LDLCALC 58 09/30/2017   TRIG 162.0 (H) 09/30/2017      Wt Readings from Last 3 Encounters:  06/16/18 191 lb (86.6 kg)  06/13/18 192 lb (87.1 kg)  05/27/18 191 lb 8 oz (86.9 kg)       ASSESSMENT AND PLAN:  Paroxysmal atrial fibrillation (HCC) - Plan: EKG 12-Lead On anticoagulation, amiodarone, bisoprolol Recommended he take Lasix 20 for 3 days then as needed for abdominal bloating and shortness of breath  Acute diastolic CHF Secondary to recent atrial fibrillation, will treat with Lasix as needed Symptoms likely exacerbated secondary to high fluid intake, alcohol  and poor sleep hygiene  Coronary artery disease involving native coronary artery of native heart without angina pectoris - Plan: EKG 12-Lead Known coronary artery disease, prior stent 10 years ago We previously ordered stress test for chest pain, he did not complete this Shortness of breath likely unrelated to ischemia  Mixed hyperlipidemia - Plan: EKG 12-Lead Cholesterol adequate on Repatha No medication changes made, stable  Essential hypertension - Plan: EKG 12-Lead Blood pressure stable, will add Lasix as needed as above  Cerebrovascular accident (CVA) due to embolism of precerebral artery ([place]) -  Maintain on anticoagulation Denies any bleeding  Encounter for anticoagulation discussion and counseling - Plan: EKG 12-Lead Previous fall, trauma to his left eye with ecchymosis Recommended he stopped drinking Continues to drink  Alcohol abuse  Alcohol cessation recommended, likely contributing to depression, poor sleep hygiene.  This was not discussed with him today  Depression Chronic fatigue, needs follow-up with psychiatry Symptoms exacerbated by alcohol abuse which in turn is affecting his sleep   Total encounter time more than 25 minutes  Greater than 50% was spent in counseling and coordination of care with the patient   Disposition:   F/U  6 months   Orders Placed This Encounter  Procedures  . EKG 12-Lead     Signed, Esmond Plants, M.D., Ph.D. 06/16/2018  [facility] [place], [place] [phone]

## 2018-06-15 NOTE — Patient Outreach (Signed)
[place] [facility] [facility]) Care Management  06/15/2018  [name] Dec 05, 1945 [id number]   ADDENDUM  Care coordination call placed to Dr. Donivan Scull office in reference to [place] patient assistance for [place].  Spoke to Dr. Donivan Scull nurse Anderson Malta. Informed Anderson Malta that Amgen was in need of a PA letter from the patient's insurance showing that a PA was submitted for the medication. Anderson Malta verbalized understanding. [facility] fax number and Amgen fax number was left with Anderson Malta to fax the letter to once received.  Will followup with Amgen in 7-10 business days to inquire on receipt of PA letter and to check status of application.  [name] P. [name], [place] Management [phone]

## 2018-06-16 ENCOUNTER — Encounter: Payer: Self-pay | Admitting: Cardiovascular Disease

## 2018-06-16 ENCOUNTER — Ambulatory Visit (INDEPENDENT_AMBULATORY_CARE_PROVIDER_SITE_OTHER): Payer: PPO | Admitting: Cardiovascular Disease

## 2018-06-16 ENCOUNTER — Ambulatory Visit (INDEPENDENT_AMBULATORY_CARE_PROVIDER_SITE_OTHER): Payer: PPO | Admitting: General Practice

## 2018-06-16 ENCOUNTER — Other Ambulatory Visit: Payer: Self-pay | Admitting: Pharmacist

## 2018-06-16 VITALS — BP 140/72 | HR 61 | Ht 73.0 in | Wt 191.0 lb

## 2018-06-16 DIAGNOSIS — I480 Paroxysmal atrial fibrillation: Secondary | ICD-10-CM | POA: Diagnosis not present

## 2018-06-16 DIAGNOSIS — E782 Mixed hyperlipidemia: Secondary | ICD-10-CM

## 2018-06-16 DIAGNOSIS — I10 Essential (primary) hypertension: Secondary | ICD-10-CM | POA: Diagnosis not present

## 2018-06-16 DIAGNOSIS — I6310 Cerebral infarction due to embolism of unspecified precerebral artery: Secondary | ICD-10-CM | POA: Diagnosis not present

## 2018-06-16 DIAGNOSIS — Z7901 Long term (current) use of anticoagulants: Secondary | ICD-10-CM

## 2018-06-16 DIAGNOSIS — I25118 Atherosclerotic heart disease of native coronary artery with other forms of angina pectoris: Secondary | ICD-10-CM

## 2018-06-16 DIAGNOSIS — R0609 Other forms of dyspnea: Secondary | ICD-10-CM | POA: Diagnosis not present

## 2018-06-16 DIAGNOSIS — I4891 Unspecified atrial fibrillation: Secondary | ICD-10-CM

## 2018-06-16 DIAGNOSIS — Z8673 Personal history of transient ischemic attack (TIA), and cerebral infarction without residual deficits: Secondary | ICD-10-CM

## 2018-06-16 LAB — POCT INR: INR: 4.1 — AB (ref 2.0–3.0)

## 2018-06-16 MED ORDER — AMIODARONE HCL 200 MG PO TABS: ORAL_TABLET | ORAL | 3 refills | Status: DC

## 2018-06-16 MED ORDER — FUROSEMIDE 20 MG PO TABS: 20.0000 mg | ORAL_TABLET | Freq: Every day | ORAL | 3 refills | Status: DC | PRN

## 2018-06-16 NOTE — Telephone Encounter (Signed)
No answer. Left message to call back with Sharee Pimple at [facility].

## 2018-06-16 NOTE — Telephone Encounter (Signed)
This encounter was created in error - please disregard.

## 2018-06-16 NOTE — Telephone Encounter (Signed)
Simcox, Luiz Ochoa, CPhT  Vanessa Ralphs, RN        Claris Gladden,  Patient does not see Dr. Caryl Bis anymore. He goes to Alma Friendly, NP at [employer] at [facility] When we reached out to her she asked [place] to followup with cardiology. How do you want to proceed?  Thanks,Jill P. Simcox, [place] Management  [phone]    -------------------------------------------------------  Dr Rockey Situ,  Do you mind [place] prescribing Repatha instead of PCP so we can help with patient assistance and proceed with prior authorization. You just saw the patient today.  Thanks, [employer]

## 2018-06-16 NOTE — Telephone Encounter (Signed)
[facility] calling states patient PCP does not follow pt for this cardiac medication. Please call to discuss.

## 2018-06-16 NOTE — Patient Instructions (Addendum)
Medication Instructions:   Please try lasix/furosemide 20 mg once a day in the Am for 3 days With banana Then take lasix as needed for ABD swelling, shortness of breath  Amiodarone down to one a day if nausea is terrible Otherwise, drop down to one a day as  scheduled  If you need a refill on your cardiac medications before your next appointment, please call your pharmacy.    Lab work: No new labs needed   If you have labs (blood work) drawn today and your tests are completely normal, you will receive your results only by: Marland Kitchen MyChart Message (if you have MyChart) OR . A paper copy in the mail If you have any lab test that is abnormal or we need to change your treatment, we will call you to review the results.   Testing/Procedures: No new testing needed   Follow-Up: At [facility], you and your health needs are our priority.  As part of our continuing mission to provide you with exceptional heart care, we have created designated Provider Care Teams.  These Care Teams include your primary Cardiologist (physician) and Advanced Practice Providers (APPs -  Physician Assistants and Nurse Practitioners) who all work together to provide you with the care you need, when you need it.  . You will need a follow up appointment in 6 months .   Please call our office 2 months in advance to schedule this appointment.    . Providers on your designated Care Team:   . Murray Hodgkins, NP . Christell Faith, PA-C . Marrianne Mood, PA-C  Any Other Special Instructions Will Be Listed Below (If Applicable).  For educational health videos Log in to : [email or web address] Or : [email or web address], password : [email or web address]

## 2018-06-16 NOTE — Patient Outreach (Signed)
[place] [facility]) Care Management  06/16/2018  [name] 11/03/1945 [id number]   Received message from Susy Frizzle, CPhT that Dr. Donivan Scull office is asking for Dr. Caryl Bis to re-prescribe Repatha and complete PA paperwork that will be required for Repatha patient assistance for 2020. The patient no longer sees Dr. Caryl Bis, he now sees Alma Friendly, NP as his PCP.   Contacted Dr. Donivan Scull office; left message asking for a call back to discuss the situation and determine next steps.   Catie Darnelle Maffucci, PharmD PGY2 Ambulatory Care Pharmacy Resident, [place] [facility] Phone: [phone]

## 2018-06-16 NOTE — Patient Instructions (Addendum)
Pre visit review using our clinic review tool, if applicable. No additional management support is needed unless otherwise documented below in the visit note.  Patient recently started amiodarone. Hold coumadin today and tomorrow (1/9 and 1/10) and then change dosage and take 1 tablet daily except 1/2 tablet on Mondays/Wednesdays and Saturdays.  Re-check in 7 to 10 days.  Patient will need to be re-checked in another 2 weeks.

## 2018-06-17 ENCOUNTER — Other Ambulatory Visit: Payer: Self-pay

## 2018-06-17 MED ORDER — FUROSEMIDE 20 MG PO TABS: 20.0000 mg | ORAL_TABLET | Freq: Every day | ORAL | 0 refills | Status: DC | PRN

## 2018-06-19 NOTE — Telephone Encounter (Signed)
Yes I can prescribe Repatha

## 2018-06-20 DIAGNOSIS — F330 Major depressive disorder, recurrent, mild: Secondary | ICD-10-CM | POA: Diagnosis not present

## 2018-06-20 DIAGNOSIS — F411 Generalized anxiety disorder: Secondary | ICD-10-CM | POA: Diagnosis not present

## 2018-06-21 ENCOUNTER — Telehealth: Payer: Self-pay | Admitting: *Deleted

## 2018-06-21 DIAGNOSIS — G4733 Obstructive sleep apnea (adult) (pediatric): Secondary | ICD-10-CM | POA: Diagnosis not present

## 2018-06-21 MED ORDER — EVOLOCUMAB 140 MG/ML ~~LOC~~ SOAJ: 140.0000 mg | SUBCUTANEOUS | 11 refills | Status: DC

## 2018-06-21 NOTE — Addendum Note (Signed)
Addended by: Vanessa Ralphs on: 06/21/2018 08:17 AM   Modules accepted: Orders

## 2018-06-21 NOTE — Telephone Encounter (Signed)
New Rx for Repatha sent to pharmacy with Dr Rockey Situ as ordering physician. Attempted to initiate Prior authorization through covermymeds.com but unable to do so because pharmacy has not run medication yet. S/w representative at [facility] who said I needed [place] number and we don't have that yet because pharmacy has not run the prescription.  Will await pharmacy to run medication and initiate prior authorization.

## 2018-06-21 NOTE — Telephone Encounter (Signed)
Pt requiring PA for Repatha inj. PA submitted to [employer] through Fax:[phone] Awaiting Approval.

## 2018-06-23 NOTE — Telephone Encounter (Addendum)
Per [employer] Repatha 466 MG/ML sureclick is approved through 06/22/2019. Patient notified of approval for [place].  The patient will contact his pharmacy.

## 2018-06-27 ENCOUNTER — Ambulatory Visit (INDEPENDENT_AMBULATORY_CARE_PROVIDER_SITE_OTHER): Payer: PPO

## 2018-06-27 DIAGNOSIS — Z8673 Personal history of transient ischemic attack (TIA), and cerebral infarction without residual deficits: Secondary | ICD-10-CM

## 2018-06-27 DIAGNOSIS — Z7901 Long term (current) use of anticoagulants: Secondary | ICD-10-CM | POA: Diagnosis not present

## 2018-06-27 DIAGNOSIS — I4891 Unspecified atrial fibrillation: Secondary | ICD-10-CM

## 2018-06-27 LAB — POCT INR: INR: 4.8 — AB (ref 2.0–3.0)

## 2018-06-27 NOTE — Patient Instructions (Signed)
Patient recently started amiodarone.  INR TODAY: 4.8  Hold coumadin today and tomorrow (1/20 and 1/21) and then change dosage to taking 1/2 tablet (3.75mg ) daily EXCEPT for 1 tablet on Sundays and Thursdays.  Re-check in 7 to 10 days.  Patient will need to be re-checked in another 2 weeks.  Patient denies any unusual bruising or bleeding and understands the risks associated with a supratherapeutic reading and will go to the ER if any concerns develop. !

## 2018-06-27 NOTE — Progress Notes (Signed)
Agree. Thanks

## 2018-06-28 ENCOUNTER — Other Ambulatory Visit: Payer: Self-pay | Admitting: Pharmacy Technician

## 2018-06-28 NOTE — Patient Outreach (Signed)
[place] [facility]) Care Management  06/28/2018  [name] June 22, 1945 [id number]   Received PA letter from Dr Donivan Scull office in reference to patient's Amgen application for [place].  Submitted the PA letter to Amgen per their request (application previously submitted).  Will followup with Amgen in 5-7 business days to inquire on status of application.  [name] P. Lonnell Chaput, [place] Management [phone]

## 2018-06-28 NOTE — Telephone Encounter (Signed)
Approval Letter received for Repatha PA has been faxed to Att: Susy Frizzle, [place] Fax: [phone].

## 2018-07-01 ENCOUNTER — Other Ambulatory Visit: Payer: Self-pay | Admitting: Pharmacy Technician

## 2018-07-01 NOTE — Patient Outreach (Signed)
[facility] [facility]) Care Management  07/01/2018  [name] [birth year] [id number]   ADDENDUM  Successful outreach call placed to patient in regards to his Amgen application for [place].  Spoke to [name], HIPAA identifiers verified. Informed [name] that the had been approved to receive his Repatha through [place] patient assistance for the remainder of 2020. Inquired if patient had any questions about how to obtain refills. [name] informed me that he had received a call from [place] yesterday and had informed them that he had approximately a one months supply of medication. He said they informed him how to call in for his refills and suggested he call 1 week before running out. Patient verbalized understanding. Confirmed with patient that he had our name and number if any questions or concerns were to arise throughout the year.  Will remove myself from care team as patient assistance is complete.  Daviana Haymaker P. Madoline Bhatt, [place] Management [phone]

## 2018-07-01 NOTE — Patient Outreach (Signed)
[place] [facility]) Care Management  07/01/2018  [name] [birth year] [id number]    Care coordination call placed to [place] in regards to patient's Repatha application.  Spoke to [place] who said patient had been APPROVED for the program from 06/29/2018-06/08/2019. Per [place] when Amgen reached out to patient on 06/30/2018, she said patient was made aware of his approval and he also stated he did not need a refill at this time and would call them when he needed a refill.  Will followup with patient to inquire if he has any further questions or concerns.  Ontario Pettengill P. Naidelyn Parrella, [place] Management [phone]

## 2018-07-05 ENCOUNTER — Ambulatory Visit: Payer: PPO

## 2018-07-06 NOTE — Telephone Encounter (Signed)
Good Morning Anderson Malta!  Just wanted to give you an FYI that I spoke to someone at [facility] this morning and Mr. Abdalla is APPROVED through 06/08/2019 to receive Repatha at [facility] cost through [place] patient assistance. THANKS for helping me in this process!  Sharee Pimple

## 2018-07-07 ENCOUNTER — Ambulatory Visit (INDEPENDENT_AMBULATORY_CARE_PROVIDER_SITE_OTHER): Payer: PPO | Admitting: Pulmonary Disease

## 2018-07-07 ENCOUNTER — Encounter: Payer: Self-pay | Admitting: Pulmonary Disease

## 2018-07-07 ENCOUNTER — Ambulatory Visit (INDEPENDENT_AMBULATORY_CARE_PROVIDER_SITE_OTHER): Payer: PPO | Admitting: General Practice

## 2018-07-07 VITALS — BP 120/70 | HR 55 | Ht 73.0 in | Wt 190.0 lb

## 2018-07-07 DIAGNOSIS — G4733 Obstructive sleep apnea (adult) (pediatric): Secondary | ICD-10-CM

## 2018-07-07 DIAGNOSIS — Z7901 Long term (current) use of anticoagulants: Secondary | ICD-10-CM

## 2018-07-07 DIAGNOSIS — I4891 Unspecified atrial fibrillation: Secondary | ICD-10-CM

## 2018-07-07 DIAGNOSIS — Z8673 Personal history of transient ischemic attack (TIA), and cerebral infarction without residual deficits: Secondary | ICD-10-CM

## 2018-07-07 LAB — POCT INR: INR: 3 (ref 2.0–3.0)

## 2018-07-07 NOTE — Patient Instructions (Signed)
I will like to see you back in the office in about 3 months  Other options of treatment will include an oral device-we will need to refer you to a dentist  Dawna Part- inspire is the name of the stimulator device Dr. Melida Quitter of [facility] is a provider for this-we will need a formal consult and evaluation to see if you are a candidate  Call with concerns/ questions

## 2018-07-07 NOTE — Patient Instructions (Addendum)
Pre visit review using our clinic review tool, if applicable. No additional management support is needed unless otherwise documented below in the visit note.  Patient recently started amiodarone.  Continue to take 1/2 tablet (3.75mg ) daily EXCEPT for 1 tablet on Sundays and Thursdays.  Re-check in 2 weeks.

## 2018-07-07 NOTE — Progress Notes (Signed)
[name]    [id number]    [birth year]  Primary Care Physician:Clark, Leticia Penna, NP  Referring Physician: Pleas Koch, NP [address] [place], [place] [address]  Chief complaint:   History of obstructive sleep apnea, central sleep apnea Significant cardiac history BiPAP does not seem to be helping his symptoms at present  HPI:  Diagnosed with sleep disordered breathing in April 2019 Had a lot of difficulty with initial diagnosis and during the treatment titration as well Did not feel he slept well Has not felt that the treatment is helping so far  We have made a few changes, has tried several different masks At present he feels the treatment is hurting rather than helping Has tried at least 3 different masks His last study only revealed obstructive events Download from the machine shows mask leaks with central events recorded by the device that correlates highly with days of significant leaks  Still having issues with fatigue, feels pressure is not adequate He does have a lot of pain and discomfort at night  history of depression and treatment that may contribute to periodic limb movement of sleep which was noted during the study  He has some symptoms of discomfort in his legs whenever he lays down-symptoms are not classic for restless legs  He does get about 8 hours of sleep almost every night Wakes up feeling tired and fatigued Not feeling better with treatment so far  Is at a point where he does not feel he wants to continue with BiPAP use  Pets: Dogs  Occupation: No pertinent occupational history Exposures: No Significant exposures Smoking history: Reformed smoker   Outpatient Encounter Medications as of 07/07/2018  Medication Sig  . amiodarone (PACERONE) 200 MG tablet Take one daily  . aspirin 81 MG tablet Take 81 mg by mouth daily.  . bisoprolol (ZEBETA) 10 MG tablet Take 1.5 tablets (15 mg total) by mouth daily.  .  Cholecalciferol (VITAMIN D) 2000 UNITS CAPS Take 2,000 Units by mouth daily.   . Coenzyme Q10 (CO Q 10 PO) Take 300 mg by mouth at bedtime.   . dicyclomine (BENTYL) 10 MG capsule Take 1 capsule (10 mg total) by mouth 2 (two) times daily with a meal. As needed.  . DULoxetine (CYMBALTA) 20 MG capsule Take 40 mg by mouth daily.  . Eszopiclone 3 MG TABS Take 1 tablet (3 mg total) by mouth at bedtime. Take immediately before bedtime  . Evolocumab (REPATHA SURECLICK) 267 MG/ML SOAJ Inject 140 mg into the skin every 14 (fourteen) days.  . furosemide (LASIX) 20 MG tablet Take 1 tablet (20 mg total) by mouth daily as needed.  . hydrochlorothiazide (HYDRODIURIL) 12.5 MG tablet Take 1 tablet (12.5 mg total) by mouth daily.  Marland Kitchen lisinopril (PRINIVIL,ZESTRIL) 20 MG tablet TAKE 1 TABLET BY MOUTH TWICE A DAY  . nitroGLYCERIN (NITROSTAT) 0.4 MG SL tablet Place 1 tablet (0.4 mg total) every 5 (five) minutes as needed under the tongue for chest pain.  . ranitidine (ZANTAC) 150 MG tablet TAKE 1 TABLET (150 MG TOTAL) BY MOUTH 2 (TWO) TIMES DAILY.  Marland Kitchen triamcinolone cream (KENALOG) 0.5 % Apply 1 application topically 2 (two) times daily as needed. For rash  . vitamin B-12 (CYANOCOBALAMIN) 1000 MCG tablet Take 1,000 mcg by mouth daily.  Marland Kitchen warfarin (COUMADIN) 7.5 MG tablet TAKE 1.5 TABLETS BY MOUTH ON MONDAY AND FRIDAY. REMAINDER OF WEEK 1 TABLET DAILY.   No  facility-administered encounter medications on file as of 07/07/2018.     Allergies as of 07/07/2018 - Review Complete 07/07/2018  Allergen Reaction Noted  . Bactrim [sulfamethoxazole-trimethoprim] Anaphylaxis 01/23/2015  . Crestor [rosuvastatin calcium]  02/11/2016    Past Medical History:  Diagnosis Date  . A-fib ([place])   . BPH (benign prostatic hyperplasia)   . Coronary artery disease    Hx of MI; S/P PCI  . Depression   . GERD (gastroesophageal reflux disease)   . Hyperlipidemia   . Hypertension   . IBS (irritable bowel syndrome)   . IBS (irritable  bowel syndrome)   . PNA (pneumonia)   . Skin cancer   . Stroke [facility])     Past Surgical History:  Procedure Laterality Date  . ADENOIDECTOMY  1951  . CARDIAC CATHETERIZATION  2006   X1 STENT  . SKIN CANCER EXCISION N/A 08/2016   Forehead.  [facility] (Dr. Deliah Boston)  . THYROIDECTOMY, PARTIAL      Family History  Problem Relation Age of Onset  . Heart Problems Father   . Heart disease Father   . Hypertension Father   . Mental illness Mother   . Arthritis Mother   . Cancer Mother        ovarian  . Stroke Paternal Uncle   . Heart disease Paternal Grandfather   . Cancer Maternal Aunt   . Stroke Maternal Grandmother   . Sudden death Maternal Grandfather   . Heart disease Paternal Grandmother     Social History   Socioeconomic History  . Marital status: Married    Spouse name: Not on file  . Number of children: Not on file  . Years of education: Not on file  . Highest education level: Not on file  Occupational History  . Not on file  Social Needs  . Financial resource strain: Not hard at all  . Food insecurity:    Worry: Never true    Inability: Never true  . Transportation needs:    Medical: No    Non-medical: No  Tobacco Use  . Smoking status: Former Research scientist (life sciences)  . Smokeless tobacco: Never Used  Substance and Sexual Activity  . Alcohol use: Yes    Alcohol/week: 2.0 - 3.0 standard drinks    Types: 2 - 3 Glasses of wine per week    Comment: Wine daily with dinner  . Drug use: No  . Sexual activity: Never  Lifestyle  . Physical activity:    Days per week: Not on file    Minutes per session: Not on file  . Stress: Not on file  Relationships  . Social connections:    Talks on phone: Not on file    Gets together: Not on file    Attends religious service: Not on file    Active member of club or organization: Not on file    Attends meetings of clubs or organizations: Not on file    Relationship status: Not on file  . Intimate partner violence:      Fear of current or ex partner: No    Emotionally abused: No    Physically abused: No    Forced sexual activity: No  Other Topics Concern  . Not on file  Social History Narrative  . Not on file    Review of systems: Review of Systems  Constitutional: Negative for fever and chills.  HENT: Negative.   Eyes: Negative for blurred vision.  Respiratory: as per HPI  Cardiovascular: Negative  for chest pain and palpitations.  Significant history of cardiac issues, atrial fibrillation Musculoskeletal: Significant muscular skeletal pain and discomfort psychiatric/Behavioral: Significant for depression.  All other systems reviewed and are negative.  Physical Exam: Vitals:   07/07/18 1130  BP: 120/70  Pulse: (!) 55  SpO2: 96%    Gen:      No acute distress HEENT:  EOMI, sclera anicteric, crowded oropharynx, Mallampati 2 Neck:     No masses; no thyromegaly Lungs:    Clear to auscultation bilaterally; normal respiratory effort CV:         Irregular, S1-S2; no murmurs  Neuro: alert and oriented x 3 Psych: normal mood and affect  Data Reviewed: Polysomnogram reviewed revealing severe obstructive sleep apnea adequately treated with BiPAP of 13/7  Compliance data reveals 93% compliance with an AHI of 14.4  Compliance of 63% AHI of 13.2 with significant leaks  Assessment:   Obstructive sleep apnea  Central sleep apnea  Atrial fibrillation  Hypertension  History of depression  Chronic fatigue  Nonrestorative sleep  Periodic limb movement of sleep  Plan/Recommendations:  .  Adjust BiPAP to 15/9 -Encouraged to continue BiPAP use -He feels it is really not helping and rather hurting him as is not getting an adequate amount of rest -Other options of treatment discussed including an oral device and also inspire stimulator -Encouraged to do some research about other options  .  I will see him back in about 3 months  .  Restless legs was discussed-symptoms not typical,  he does have periodic limb movement of sleep-may be related to his antidepressants, muscular skeletal pains and discomfort  .  Continue optimizing sleep hygiene  .  He is sleeping better with using a eszopiclone for his insomnia  Sherrilyn Rist MD [facility] 07/07/2018, 12:59 PM  CC: Pleas Koch, NP

## 2018-07-09 ENCOUNTER — Other Ambulatory Visit: Payer: Self-pay | Admitting: Cardiovascular Disease

## 2018-07-21 ENCOUNTER — Ambulatory Visit (INDEPENDENT_AMBULATORY_CARE_PROVIDER_SITE_OTHER): Payer: PPO | Admitting: General Practice

## 2018-07-21 DIAGNOSIS — Z8673 Personal history of transient ischemic attack (TIA), and cerebral infarction without residual deficits: Secondary | ICD-10-CM

## 2018-07-21 DIAGNOSIS — Z7901 Long term (current) use of anticoagulants: Secondary | ICD-10-CM | POA: Diagnosis not present

## 2018-07-21 DIAGNOSIS — I4891 Unspecified atrial fibrillation: Secondary | ICD-10-CM

## 2018-07-21 LAB — POCT INR: INR: 3.6 — AB (ref 2.0–3.0)

## 2018-07-21 NOTE — Patient Instructions (Addendum)
Pre visit review using our clinic review tool, if applicable. No additional management support is needed unless otherwise documented below in the visit note.  Patient recently started amiodarone.  Hold coumadin today and then change dosage and take 1/2 tablet (3.75mg ) daily EXCEPT for 1 tablet on Sundays.  Re-check in 3 weeks.

## 2018-07-22 DIAGNOSIS — G4733 Obstructive sleep apnea (adult) (pediatric): Secondary | ICD-10-CM | POA: Diagnosis not present

## 2018-08-01 ENCOUNTER — Telehealth: Payer: Self-pay | Admitting: Cardiovascular Disease

## 2018-08-01 NOTE — Telephone Encounter (Signed)
New Message   *STAT* If patient is at the pharmacy, call can be transferred to refill team.   1. Which medications need to be refilled? (please list name of each medication and dose if known)  Amiodarone 200 mg once daily  2. Which pharmacy/location (including street and city if local pharmacy) is medication to be sent to? [facility] [place], [place]  3. Do they need a 30 day or 90 day supply?  30 day supply  Pt wasn't sure if he needed to continue to take this medication and if she he needs a new rx sent to pharmacy.  Pt also verbalized one side effect is nausea.  Thanks!

## 2018-08-02 ENCOUNTER — Emergency Department (HOSPITAL_COMMUNITY): Payer: PPO

## 2018-08-02 ENCOUNTER — Encounter (HOSPITAL_COMMUNITY): Payer: Self-pay

## 2018-08-02 ENCOUNTER — Emergency Department (HOSPITAL_COMMUNITY)
Admission: EM | Admit: 2018-08-02 | Discharge: 2018-08-02 | Disposition: A | Discharge status: Home / Self Care | Payer: PPO | Attending: Emergency Medicine | Admitting: Emergency Medicine

## 2018-08-02 DIAGNOSIS — Z79899 Other long term (current) drug therapy: Secondary | ICD-10-CM | POA: Insufficient documentation

## 2018-08-02 DIAGNOSIS — Z85828 Personal history of other malignant neoplasm of skin: Secondary | ICD-10-CM | POA: Insufficient documentation

## 2018-08-02 DIAGNOSIS — Z7982 Long term (current) use of aspirin: Secondary | ICD-10-CM | POA: Diagnosis not present

## 2018-08-02 DIAGNOSIS — R270 Ataxia, unspecified: Secondary | ICD-10-CM | POA: Insufficient documentation

## 2018-08-02 DIAGNOSIS — Z7901 Long term (current) use of anticoagulants: Secondary | ICD-10-CM | POA: Diagnosis not present

## 2018-08-02 DIAGNOSIS — R42 Dizziness and giddiness: Secondary | ICD-10-CM | POA: Diagnosis not present

## 2018-08-02 DIAGNOSIS — I69359 Hemiplegia and hemiparesis following cerebral infarction affecting unspecified side: Secondary | ICD-10-CM | POA: Insufficient documentation

## 2018-08-02 DIAGNOSIS — Z87891 Personal history of nicotine dependence: Secondary | ICD-10-CM | POA: Insufficient documentation

## 2018-08-02 DIAGNOSIS — I2510 Atherosclerotic heart disease of native coronary artery without angina pectoris: Secondary | ICD-10-CM | POA: Insufficient documentation

## 2018-08-02 DIAGNOSIS — I10 Essential (primary) hypertension: Secondary | ICD-10-CM | POA: Insufficient documentation

## 2018-08-02 DIAGNOSIS — R29898 Other symptoms and signs involving the musculoskeletal system: Secondary | ICD-10-CM

## 2018-08-02 DIAGNOSIS — R531 Weakness: Secondary | ICD-10-CM | POA: Diagnosis not present

## 2018-08-02 LAB — HEPATIC FUNCTION PANEL
ALT: 31 U/L (ref 0–44)
AST: 41 U/L (ref 15–41)
Albumin: 3.7 g/dL (ref 3.5–5.0)
Alkaline Phosphatase: 82 U/L (ref 38–126)
Bilirubin, Direct: 0.5 mg/dL — ABNORMAL HIGH (ref 0.0–0.2)
Indirect Bilirubin: 0.4 mg/dL (ref 0.3–0.9)
Total Bilirubin: 0.9 mg/dL (ref 0.3–1.2)
Total Protein: 6.9 g/dL (ref 6.5–8.1)

## 2018-08-02 LAB — CBC WITH DIFFERENTIAL/PLATELET
Abs Immature Granulocytes: 0.02 10*3/uL (ref 0.00–0.07)
BASOS PCT: 1 %
Basophils Absolute: 0 10*3/uL (ref 0.0–0.1)
Eosinophils Absolute: 0.1 10*3/uL (ref 0.0–0.5)
Eosinophils Relative: 2 %
HCT: 46.3 % (ref 39.0–52.0)
Hemoglobin: 15.3 g/dL (ref 13.0–17.0)
Immature Granulocytes: 0 %
Lymphocytes Relative: 29 %
Lymphs Abs: 1.9 10*3/uL (ref 0.7–4.0)
MCH: 30.6 pg (ref 26.0–34.0)
MCHC: 33 g/dL (ref 30.0–36.0)
MCV: 92.6 fL (ref 80.0–100.0)
Monocytes Absolute: 0.7 10*3/uL (ref 0.1–1.0)
Monocytes Relative: 12 %
Neutro Abs: 3.6 10*3/uL (ref 1.7–7.7)
Neutrophils Relative %: 56 %
PLATELETS: 206 10*3/uL (ref 150–400)
RBC: 5 MIL/uL (ref 4.22–5.81)
RDW: 13.2 % (ref 11.5–15.5)
WBC: 6.4 10*3/uL (ref 4.0–10.5)
nRBC: 0 % (ref 0.0–0.2)

## 2018-08-02 LAB — CBC
HEMATOCRIT: 47.5 % (ref 39.0–52.0)
Hemoglobin: 15.6 g/dL (ref 13.0–17.0)
MCH: 30.7 pg (ref 26.0–34.0)
MCHC: 32.8 g/dL (ref 30.0–36.0)
MCV: 93.5 fL (ref 80.0–100.0)
Platelets: 210 10*3/uL (ref 150–400)
RBC: 5.08 MIL/uL (ref 4.22–5.81)
RDW: 13.2 % (ref 11.5–15.5)
WBC: 6.9 10*3/uL (ref 4.0–10.5)
nRBC: 0 % (ref 0.0–0.2)

## 2018-08-02 LAB — RAPID URINE DRUG SCREEN, HOSP PERFORMED
Amphetamines: NOT DETECTED
Barbiturates: NOT DETECTED
Benzodiazepines: NOT DETECTED
Cocaine: NOT DETECTED
Opiates: NOT DETECTED
Tetrahydrocannabinol: NOT DETECTED

## 2018-08-02 LAB — BASIC METABOLIC PANEL
Anion gap: 7 (ref 5–15)
BUN: 15 mg/dL (ref 8–23)
CHLORIDE: 101 mmol/L (ref 98–111)
CO2: 30 mmol/L (ref 22–32)
Calcium: 9.1 mg/dL (ref 8.9–10.3)
Creatinine, Ser: 1.33 mg/dL — ABNORMAL HIGH (ref 0.61–1.24)
GFR calc Af Amer: >60 mL/min (ref >60)
GFR calc non Af Amer: 53 mL/min — ABNORMAL LOW (ref >60)
Glucose, Bld: 101 mg/dL — ABNORMAL HIGH (ref 70–99)
Potassium: 4.1 mmol/L (ref 3.5–5.1)
Sodium: 138 mmol/L (ref 135–145)

## 2018-08-02 LAB — URINALYSIS, ROUTINE W REFLEX MICROSCOPIC
Bilirubin Urine: NEGATIVE
Glucose, UA: NEGATIVE mg/dL
Hgb urine dipstick: NEGATIVE
Ketones, ur: NEGATIVE mg/dL
Leukocytes,Ua: NEGATIVE
Nitrite: NEGATIVE
Protein, ur: NEGATIVE mg/dL
Specific Gravity, Urine: 1.012 (ref 1.005–1.030)
pH: 6 (ref 5.0–8.0)

## 2018-08-02 LAB — PROTIME-INR
INR: 2.8 — AB (ref 0.8–1.2)
Prothrombin Time: 29 seconds — ABNORMAL HIGH (ref 11.4–15.2)

## 2018-08-02 LAB — ETHANOL: Alcohol, Ethyl (B): <10 mg/dL (ref <10)

## 2018-08-02 LAB — APTT: aPTT: 39 seconds — ABNORMAL HIGH (ref 24–36)

## 2018-08-02 MED ORDER — AMIODARONE HCL 200 MG PO TABS: ORAL_TABLET | ORAL | 3 refills | Status: DC

## 2018-08-02 MED ORDER — SODIUM CHLORIDE 0.9% FLUSH: 3.0000 mL | Freq: Once | INTRAVENOUS | Status: DC

## 2018-08-02 NOTE — Telephone Encounter (Signed)
Noted and agree with plan. He arrived to the ED at 12:22pm today. No notes to review at this time.

## 2018-08-02 NOTE — ED Notes (Signed)
Pt transported to and from MRI via wheelchair with tech, tolerated well.

## 2018-08-02 NOTE — Telephone Encounter (Signed)
Called and spoke to triage nurse Marya Amsler at [facility] and advised him that patient is on the way to the ER. Information given to [facility] regarding patient's symptoms.

## 2018-08-02 NOTE — ED Notes (Signed)
RN informed Pt can receive visitor  

## 2018-08-02 NOTE — ED Triage Notes (Signed)
Pt presents for evaluation of hypertension and dizziness x 2-3 days. Pt reports feeling more weak to L side x 2 weeks, hx of CVA to that side. Denies CP/SOB.

## 2018-08-02 NOTE — Discharge Instructions (Signed)
Follow up with your neurologist.  Return for worsening symptoms.   The neurologist here looked at your MRI and he thought that the finding seen by the radiologist was not concerning and suggested outpatient follow-up.

## 2018-08-02 NOTE — Telephone Encounter (Signed)
Patient called stating that he talked with Dr. Donivan Scull office and was advised to contact his PCP regarding his symptoms. Patient stated that his BP last night was 170/100, but has not taken it today. Patient stated that he is dizzy and had trouble driving to work today staying in the lines, is weak and has a slight headache. Patient stated that he has weakness on his left side from a previous stroke, but left side seems to be weaker today. Advised patient that with his history he should go to the ER now. Patient stated that he is at work in [place] and will have someone drive him to the ER now.

## 2018-08-02 NOTE — Telephone Encounter (Signed)
Please see not below.  Patient is still having nausea with dropping down to once a day.   Please advise if you would still like me to refill

## 2018-08-02 NOTE — ED Provider Notes (Signed)
73 yo M with a chief complaint of difficulty with ambulation.  He also had some left-sided weakness.  I received the patient in signout from Dr. Rogene Houston.  The plan is to obtain an MRI to evaluate for a new stroke.  This was negative.  I went and discussed the results with the patient and then ambulated him in the room when he feels that he is walking much better than he was previously and that he is now back to baseline.  Feels like he no longer has weakness to the left side that is different than his chronic weakness.  On his MRI there was a read about chronic cerebellar hemorrhage, I discussed this with Dr. Cheral Marker who did not feel that this need to be worked up further in the hospital.  We will have him follow-up with his neurologist.   Deno Etienne, DO 08/02/18 2028

## 2018-08-02 NOTE — Telephone Encounter (Signed)
Spoke with patient. He says he feels the amiodarone is working but is still nauseas at times. At this point he is willing to continue it.  He then went into saying that he feels more unsteady on his feet than usual. He's also been experiencing dizziness. He also feels more weak on left side than normal. He had his wife drive him to his job today at the [employer]. Advised him he should go to the ER if these symptoms are not usual for him. He preferred to see his PCP instead. Advised him to call there as soon as he can for advice from them as well. Pt verbalized understanding to call 911 or go to the emergency room, if he develops any new or worsening symptoms. Rx for amiodarone sent to pharmacy.  Routing to Dr Rockey Situ for review.

## 2018-08-02 NOTE — ED Provider Notes (Signed)
[place] EMERGENCY DEPARTMENT Provider Note   CSN: [id number] Arrival date & time: 08/02/18  1206    History   Chief Complaint No chief complaint on file.   HPI [name] is a 73 y.o. male.     Patient with acute onset of feeling off balance when he was going when walking.  Feeling like he was going to fall over.  And some increased weakness to his left side.  Symptoms started yesterday.  Patient had a previous stroke 13 years ago with residual weakness on the left side but his patient's wife states that he is has increased weakness on the left side since yesterday.  She also was concerned maybe his vision was off patient denied any visual changes.  They both deny any speech changes.  Patient also with a mild headache at the base of the skull that also started yesterday.  Patient stroke 13 years ago was related to atrial fibrillation.  Patient is on Coumadin.  Patient has been working hard to control his A. fib.  Nursing note almost made comment about the fact that elevated blood pressure.  Initial blood pressure is 142/82.  Patient denies any chest pain or shortness of breath.  Patient denies any sensory changes.  There is no vertigo or room spinning.     Past Medical History:  Diagnosis Date  . A-fib ([place])   . BPH (benign prostatic hyperplasia)   . Coronary artery disease    Hx of MI; S/P PCI  . Depression   . GERD (gastroesophageal reflux disease)   . Hyperlipidemia   . Hypertension   . IBS (irritable bowel syndrome)   . IBS (irritable bowel syndrome)   . PNA (pneumonia)   . Skin cancer   . Stroke [facility])     Patient Active Problem List   Diagnosis Date Noted  . Chronic obstructive pulmonary disease ([place]) 06/13/2018  . Abdominal pain 05/27/2018  . Long term (current) use of anticoagulants 11/19/2017  . Wrist pain, acute, right 11/05/2017  . Sleep apnea 09/16/2017  . Encounter for current long-term use of anticoagulants 07/24/2017  . Fall  07/21/2017  . Constipation 07/21/2017  . Chronic fatigue 06/18/2017  . Tremor 06/18/2017  . Eczema 06/18/2017  . Testicular atrophy 04/23/2017  . Dyspnea on exertion 03/16/2017  . Erectile dysfunction 03/05/2017  . Preventative health care 02/11/2016  . Palpitations 12/09/2015  . Anxiety and depression 02/08/2015  . GERD (gastroesophageal reflux disease) 02/08/2015  . IBS (irritable bowel syndrome) 02/08/2015  . Essential hypertension 02/08/2015  . A-fib ([place]) 01/23/2015  . CAD (coronary artery disease) 01/23/2015  . Hyperlipidemia 01/23/2015  . History of CVA (cerebrovascular accident) 01/23/2015    Past Surgical History:  Procedure Laterality Date  . ADENOIDECTOMY  1951  . CARDIAC CATHETERIZATION  2006   X1 STENT  . SKIN CANCER EXCISION N/A 08/2016   Forehead.  [facility] (Dr. Deliah Boston)  . THYROIDECTOMY, PARTIAL          Home Medications    Prior to Admission medications   Medication Sig Start Date End Date Taking? Authorizing Provider  amiodarone (PACERONE) 200 MG tablet Take one daily Patient taking differently: Take 200 mg by mouth daily.  08/02/18  Yes Minna Merritts, MD  aspirin 81 MG tablet Take 81 mg by mouth daily.   Yes [provider]  bisoprolol (ZEBETA) 10 MG tablet Take 1.5 tablets (15 mg total) by mouth daily. 05/27/18  Yes Tower, Wynelle Fanny, MD  Cholecalciferol (VITAMIN D) 2000 UNITS CAPS Take 2,000 Units by mouth daily.    Yes [provider]  Coenzyme Q10 (CO Q 10 PO) Take 300 mg by mouth at bedtime.    Yes [provider]  dicyclomine (BENTYL) 10 MG capsule Take 1 capsule (10 mg total) by mouth 2 (two) times daily with a meal. As needed. Patient taking differently: Take 10 mg by mouth 2 (two) times daily with a meal.  05/17/18  Yes Pleas Koch, NP  DULoxetine (CYMBALTA) 20 MG capsule Take 40 mg by mouth daily.   Yes [provider]  Eszopiclone 3 MG TABS Take 1 tablet (3 mg total) by mouth at  bedtime. Take immediately before bedtime Patient taking differently: Take 3 mg by mouth at bedtime.  04/28/18  Yes Olalere, Adewale A, MD  Evolocumab (REPATHA SURECLICK) 829 MG/ML SOAJ Inject 140 mg into the skin every 14 (fourteen) days. 06/21/18  Yes Gollan, Kathlene November, MD  hydrochlorothiazide (HYDRODIURIL) 12.5 MG tablet Take 1 tablet (12.5 mg total) by mouth daily. 06/16/18  Yes Gollan, Kathlene November, MD  lisinopril (PRINIVIL,ZESTRIL) 20 MG tablet TAKE 1 TABLET BY MOUTH TWICE A DAY Patient taking differently: Take 20 mg by mouth daily.  09/08/17  Yes Leone Haven, MD  nitroGLYCERIN (NITROSTAT) 0.4 MG SL tablet Place 1 tablet (0.4 mg total) every 5 (five) minutes as needed under the tongue for chest pain. 04/16/17  Yes Leone Haven, MD  ranitidine (ZANTAC) 150 MG tablet TAKE 1 TABLET (150 MG TOTAL) BY MOUTH 2 (TWO) TIMES DAILY. Patient taking differently: Take 150 mg by mouth 2 (two) times daily.  12/06/17  Yes Baity, Coralie Keens, NP  triamcinolone cream (KENALOG) 0.5 % Apply 1 application topically 2 (two) times daily as needed. For rash Patient taking differently: Apply 1 application topically 2 (two) times daily as needed (rash).  06/26/17  Yes Leone Haven, MD  vitamin B-12 (CYANOCOBALAMIN) 1000 MCG tablet Take 1,000 mcg by mouth daily.   Yes [provider]  warfarin (COUMADIN) 7.5 MG tablet TAKE 1.5 TABLETS BY MOUTH ON MONDAY AND FRIDAY. REMAINDER OF WEEK 1 TABLET DAILY. Patient taking differently: Take 3.75-7.5 mg by mouth See admin instructions. Take 7.5mg  on sundays, all other days take 3.75mg . 05/17/18  Yes Pleas Koch, NP  furosemide (LASIX) 20 MG tablet TAKE 1 TABLET BY MOUTH EVERY DAY AS NEEDED Patient not taking: Reported on 08/02/2018 07/11/18   Minna Merritts, MD    Family History Family History  Problem Relation Age of Onset  . Heart Problems Father   . Heart disease Father   . Hypertension Father   . Mental illness Mother   . Arthritis Mother   . Cancer  Mother        ovarian  . Stroke Paternal Uncle   . Heart disease Paternal Grandfather   . Cancer Maternal Aunt   . Stroke Maternal Grandmother   . Sudden death Maternal Grandfather   . Heart disease Paternal Grandmother     Social History Social History   Tobacco Use  . Smoking status: Former Research scientist (life sciences)  . Smokeless tobacco: Never Used  Substance Use Topics  . Alcohol use: Yes    Alcohol/week: 2.0 - 3.0 standard drinks    Types: 2 - 3 Glasses of wine per week    Comment: Wine daily with dinner  . Drug use: No     Allergies   Bactrim [sulfamethoxazole-trimethoprim] and Crestor [rosuvastatin calcium]   Review of  Systems Review of Systems  Constitutional: Negative for chills and fever.  HENT: Negative for rhinorrhea and sore throat.   Eyes: Positive for visual disturbance. Negative for photophobia.  Respiratory: Negative for cough and shortness of breath.   Cardiovascular: Negative for chest pain and leg swelling.  Gastrointestinal: Negative for abdominal pain, diarrhea, nausea and vomiting.  Genitourinary: Negative for dysuria.  Musculoskeletal: Positive for neck pain. Negative for back pain.  Skin: Negative for rash.  Neurological: Positive for dizziness, weakness and headaches. Negative for facial asymmetry, speech difficulty, light-headedness and numbness.  Hematological: Does not bruise/bleed easily.  Psychiatric/Behavioral: Negative for confusion.     Physical Exam Updated Vital Signs BP 139/81   Pulse (!) 56   Temp 97.6 F (36.4 C) (Oral)   Resp 10   SpO2 97%   Physical Exam Vitals signs and nursing note reviewed.  Constitutional:      Appearance: He is well-developed.  HENT:     Head: Normocephalic and atraumatic.     Nose: No congestion.     Mouth/Throat:     Mouth: Mucous membranes are moist.  Eyes:     Extraocular Movements: Extraocular movements intact.     Conjunctiva/sclera: Conjunctivae normal.     Pupils: Pupils are equal, round, and  reactive to light.  Neck:     Musculoskeletal: Normal range of motion and neck supple. No neck rigidity.  Cardiovascular:     Rate and Rhythm: Normal rate and regular rhythm.     Heart sounds: Normal heart sounds. No murmur.  Pulmonary:     Effort: Pulmonary effort is normal. No respiratory distress.     Breath sounds: Normal breath sounds.  Abdominal:     General: Bowel sounds are normal.     Palpations: Abdomen is soft.     Tenderness: There is no abdominal tenderness.  Musculoskeletal: Normal range of motion.        General: No swelling.  Skin:    General: Skin is warm and dry.     Capillary Refill: Capillary refill takes less than 2 seconds.  Neurological:     General: No focal deficit present.     Mental Status: He is alert and oriented to person, place, and time.     Cranial Nerves: No cranial nerve deficit.     Sensory: No sensory deficit.     Motor: Weakness present.     Coordination: Coordination normal.     Comments: Patient with some residual left-sided weakness.  Wife feels that it is a little more pronounced than usual.  Involves left arm and left leg.      ED Treatments / Results  Labs (all labs ordered are listed, but only abnormal results are displayed) Labs Reviewed  BASIC METABOLIC PANEL - Abnormal; Notable for the following components:      Result Value   Glucose, Bld 101 (*)    Creatinine, Ser 1.33 (*)    GFR calc non Af Amer 53 (*)    All other components within normal limits  PROTIME-INR - Abnormal; Notable for the following components:   Prothrombin Time 29.0 (*)    INR 2.8 (*)    All other components within normal limits  APTT - Abnormal; Notable for the following components:   aPTT 39 (*)    All other components within normal limits  HEPATIC FUNCTION PANEL - Abnormal; Notable for the following components:   Bilirubin, Direct 0.5 (*)    All other components within normal limits  CBC  ETHANOL  CBC WITH DIFFERENTIAL/PLATELET  RAPID URINE DRUG  SCREEN, HOSP PERFORMED  URINALYSIS, ROUTINE W REFLEX MICROSCOPIC    EKG EKG Interpretation  Date/Time:  Tuesday August 02 2018 12:18:44 EST Ventricular Rate:  56 PR Interval:  194 QRS Duration: 84 QT Interval:  470 QTC Calculation: 453 R Axis:   57 Text Interpretation:  Sinus bradycardia Septal infarct , age undetermined Abnormal ECG No previous ECGs available Confirmed by Fredia Sorrow [phone]) on 08/02/2018 2:15:00 PM   Radiology Ct Head Wo Contrast  Result Date: 08/02/2018 CLINICAL DATA:  Hypertension dizziness 2-3 days EXAM: CT HEAD WITHOUT CONTRAST TECHNIQUE: Contiguous axial images were obtained from the base of the skull through the vertex without intravenous contrast. COMPARISON:  None. FINDINGS: Brain: No acute intracranial hemorrhage. No focal mass lesion. No CT evidence of acute infarction. No midline shift or mass effect. No hydrocephalus. Basilar cisterns are patent. Mild periventricular white matter hypodensities.  Mild atrophy. Vascular: No hyperdense vessel or unexpected calcification. Skull: Normal. Negative for fracture or focal lesion. Sinuses/Orbits: Paranasal sinuses and mastoid air cells are clear. Orbits are clear. Other: None. IMPRESSION: 1. No acute intracranial findings. 2. Atrophy and white matter microvascular disease. Electronically Signed   By: Suzy Bouchard M.D.   On: 08/02/2018 15:42    Procedures Procedures (including critical care time)  Medications Ordered in ED Medications  sodium chloride flush (NS) 0.9 % injection 3 mL (has no administration in time range)     Initial Impression / Assessment and Plan / ED Course  I have reviewed the triage vital signs and the nursing notes.  Pertinent labs & imaging results that were available during my care of the patient were reviewed by me and considered in my medical decision making (see chart for details).       Patient's presentation concerning for recurrent stroke that would have occurred  yesterday.  So patient is out of the TPA window.  Patient without any speech problems.  Some questionable visual problems but not able to pick him up on exam.  Definitely has some residual left-sided weakness whether this is comparable to his old stroke or whether there is been some worsening of that his wife feels that this may be been worse.  Definitely has been offkilter with his gait and feels as if he is going to fall over.  But there is no vertigo or room spinning.   No significant VAN symptoms.  Stroke order set was done.  Code stroke was not activated.  Work-up as a potential stroke.  If head CT is negative will get MRI brain.  Disposition will be based on MRI of brain findings.  Patient's Coumadin level is therapeutic.  EKG here is not showing atrial fib.  Patient's blood pressure is actually well controlled.  I presented with slightly elevated blood pressures about subsequent blood pressures have been normal.  Final Clinical Impressions(s) / ED Diagnoses   Final diagnoses:  Ataxia  Weakness of extremity    ED Discharge Orders    None       Fredia Sorrow, MD 08/02/18 [phone]

## 2018-08-03 ENCOUNTER — Telehealth: Payer: Self-pay | Admitting: Cardiovascular Disease

## 2018-08-03 NOTE — Telephone Encounter (Signed)
Called patient's wife. I explained Ryan's recommendations to her. She said, "ok, see you tomorrow at 1:30pm." She then hung up the phone.

## 2018-08-03 NOTE — Telephone Encounter (Signed)
Spoke with patient's wife, ok per DPR. Patient needs a note that he can drive so he can go back to work. Needs to work to pay his bills. Patient has appointment with neurology in mid-April.  Wife states patient had gotten up yesterday morning, told wife he had a high BP reading the night before.  Wife states patient was short of breath, dizzy and off balance at that time.  Patient did end up going to the ED after talking with [facility] and PCP.  Wife expressed that he needs a note to drive in order to pay his bills.  They need the note by Friday when he goes back to work.  She would like patient to see Dr Rockey Situ before then. Advised that I had appointment with APP tomorrow and we could see him then. She stated "does my husband have to be about to drop dead or dying before he can see his doctor." Expressed by apologies for this frustration but that Dr Rockey Situ did not have an appointment before Friday but that APP could see him for evaluation.  She went on to say that they have been in [place] for 3 years and have had nothing but a hard time with the healthcare system.  She asked if [place] was so much of a wealthier state than [place] that here patient's don't get to see a doctor. Politely expressed I was sorry patient could not see Dr Rockey Situ himself but that we had appointment with a cardiology provider tomorrow.  She accepted appointment tomorrow with Thurmond Butts.

## 2018-08-03 NOTE — Telephone Encounter (Signed)
New Message:     Pt needs a letter for work stating that he is okay to drive for his job. He needs this letter by Friday please. 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

## 2018-08-03 NOTE — Telephone Encounter (Addendum)
Please call patient back. We are happy to see him from a cardiac perspective, but if this is a gait issue or ataxia, that is unlikely to be cardiac and it unlikely I will be able to clear him for driving. He will likely need further testing prior to being cleared for driving, whether this be cardiac or neurologic testing. I don't want them to be upset, I just want them to understand this may be a complex issue. Patient safety comes first.

## 2018-08-04 ENCOUNTER — Ambulatory Visit (INDEPENDENT_AMBULATORY_CARE_PROVIDER_SITE_OTHER): Payer: PPO | Admitting: Physician Assistant

## 2018-08-04 ENCOUNTER — Encounter: Payer: Self-pay | Admitting: Physician Assistant

## 2018-08-04 ENCOUNTER — Ambulatory Visit: Payer: PPO

## 2018-08-04 VITALS — BP 126/60 | HR 58 | Wt 188.0 lb

## 2018-08-04 DIAGNOSIS — I480 Paroxysmal atrial fibrillation: Secondary | ICD-10-CM | POA: Diagnosis not present

## 2018-08-04 DIAGNOSIS — I10 Essential (primary) hypertension: Secondary | ICD-10-CM

## 2018-08-04 DIAGNOSIS — R400 Somnolence: Secondary | ICD-10-CM | POA: Diagnosis not present

## 2018-08-04 DIAGNOSIS — T466X5A Adverse effect of antihyperlipidemic and antiarteriosclerotic drugs, initial encounter: Secondary | ICD-10-CM

## 2018-08-04 DIAGNOSIS — I2510 Atherosclerotic heart disease of native coronary artery without angina pectoris: Secondary | ICD-10-CM | POA: Diagnosis not present

## 2018-08-04 DIAGNOSIS — E785 Hyperlipidemia, unspecified: Secondary | ICD-10-CM

## 2018-08-04 DIAGNOSIS — G720 Drug-induced myopathy: Secondary | ICD-10-CM

## 2018-08-04 DIAGNOSIS — Z8673 Personal history of transient ischemic attack (TIA), and cerebral infarction without residual deficits: Secondary | ICD-10-CM | POA: Diagnosis not present

## 2018-08-04 DIAGNOSIS — R270 Ataxia, unspecified: Secondary | ICD-10-CM

## 2018-08-04 MED ORDER — AMIODARONE HCL 100 MG PO TABS: 100.0000 mg | ORAL_TABLET | Freq: Every day | ORAL | 3 refills | Status: DC

## 2018-08-04 NOTE — Patient Instructions (Addendum)
Medication Instructions:  Your physician has recommended you make the following change in your medication:  1- DECREASE Amiodarone to Take 1 tablet (100 mg total) by mouth daily  If you need a refill on your cardiac medications before your next appointment, please call your pharmacy.   Lab work: None ordered   If you have labs (blood work) drawn today and your tests are completely normal, you will receive your results only by: Marland Kitchen MyChart Message (if you have MyChart) OR . A paper copy in the mail If you have any lab test that is abnormal or we need to change your treatment, we will call you to review the results.  Testing/Procedures: zio A zio monitor was placed today. It will remain on for 14 days. You will then return monitor and event diary in provided box. It takes 1-2 weeks for report to be downloaded and returned to [place]. We will call you with the results. If monitor falls of or has orange flashing light, please call Zio for further instructions.    Follow-Up: At [facility], you and your health needs are our priority.  As part of our continuing mission to provide you with exceptional heart care, we have created designated Provider Care Teams.  These Care Teams include your primary Cardiologist (physician) and Advanced Practice Providers (APPs -  Physician Assistants and Nurse Practitioners) who all work together to provide you with the care you need, when you need it. You will need a follow up appointment in 3 months.  Please see Dr. Rockey Situ.

## 2018-08-04 NOTE — Progress Notes (Signed)
Cardiology Office Note Date:  08/04/2018  Patient ID:  [name], DOB 17-Feb-1946, MRN [id number] PCP:  Pleas Koch, NP  Cardiologist:  Dr. Rockey Situ, MD    Chief Complaint: ED follow up  History of Present Illness: [name] is a 73 y.o. male with history of CAD status post PCI to the LCx in 2007, PAF on amiodarone and Coumadin, stroke treated with TPA with residual left-sided weakness, reported recurrent mini stroke in 2015 seen on PET scan without symptoms and on Coumadin at the time, alcohol abuse drinking 3-5+ glasses of wine per night, poor sleep with reported obstructive sleep apnea and central sleep apnea not on therapy, prior retinal tear, hypertension, IBS, and GERD who presents for ED follow-up of ataxia.  Most recent ischemic evaluation from 06/13/2015 via nuclear stress test showed no significant ischemia or infarction with an EF of 55 to 65%, normal study.  Holter monitoring in 12/2015 showed normal sinus rhythm with rare PACs and PVCs representing less than 1% of total beats.  Longest atrial run of 6 beats, otherwise no significant arrhythmia.  Repeat outpatient cardiac monitoring in 01/2018 showed normal sinus rhythm with an average rate of 69 bpm.  PAF occurred representing a 6% burden, with rates ranging from 48 to 171 bpm with an average rate of 84 bpm.  The longest episode was 17 hours and 41 minutes with an average rate of 84 bpm.  34 SVT/atrial tachycardia runs occurred, with the fastest interval lasting 8 beats with a maximum rate of 171 bpm, and the longest interval lasting 12 beats with an average rate of 93 bpm.  Isolated PACs were rare, atrial couplets were rare, atrial triplets were rare.  Isolated PVCs were rare with ventricular bigeminy being present.  Patient was seen in the ED on 08/02/2018 for acute onset of "feeling off balance" while walking, that began on 2/24.  There was some associated weakness along the left side that was reportedly increased from his  baseline residual left-sided weakness stemming from his prior stroke.  Patient's wife was concerned for vision changes however patient denied this.  There were no changes in his speech.  There was associated headache at the base of his skull.  BP 139/81, heart rate 56 bpm, O2 sats 97%.  CT head showed no acute intracranial findings with atrophy and white matter microvascular disease.  MRI of the brain showed no acute intracranial finding, small focus of chronic hemorrhage in the left cerebellum, chronic ischemic microangiopathy and generalized atrophy.  Urine drug screen negative, ethanol less than 10, CBC unremarkable, AST/ALT normal, albumin 3.7, sodium 138, potassium 4.1, glucose 101, serum creatinine 1.33 with a baseline of 1.1-1.2, INR 2.8.  EKG sinus bradycardia, 56 bpm, no acute ST-T changes.  While in the ED, patient noted symptoms improved and was able to walk at his baseline.  It was recommended he follow-up with neurology.  Phone notes leading up to the patient's ED visit indicate a self-reported BP 170/100 and with the patient having difficulty staying in staying in the lines while attempting to drive to work.  In this setting, the wife had him pull over and she finished the drive to [place].  Upon the patient arriving at work, several coworkers told him he did not look well though the patient felt at his baseline.  The patient's wife felt like his lower extremity weakness was more pronounced, the patient was unaware of this.  Given this, it was recommended he be evaluated in the ED  as outlined above.  Following his work-up in the ED, patient called our office on 2/26 requesting a note to return back to work on 2/28.  In this setting, appointment was made.  There is some question of increased nausea while on amiodarone, which per notes indicates improves with drinking wine.  It appears if he is not working he typically sleeps most of the day.  Patient was most recently seen in the office on  06/16/2018 noting new onset of chest pain, chronic exhaustion, abdominal bloating, weight gain, and increased shortness of breath over the past several weeks.  Weight was noted to be up 2 pounds when compared to prior visit from 04/2018.  He was started on Lasix 20 mg for 3 days followed by as needed for abdominal bloating and shortness of breath.  Initially, patient preferred to remain with rate control however in late 05/2018 he felt like he was having increased episodes of A. fib.  In this setting, he was started on amiodarone.  The patient comes in accompanied by his wife today.  He reports feeling back to his baseline with residual left-sided weakness that stems from his prior stroke.  They report following his ED visit he was quite tired and slept for most of the day afterwards.  He continues to note some nausea with amiodarone though this is somewhat improved when taking this medication with food.  He reports the above symptoms that led to the ED were not similar to his symptoms in 2007 when he had his MI.  No falls, BRBPR, or melena.  He has a follow-up appointment with neurology in 09/2018.  The patient and his wife indicate several RNs told him he was in A. fib while in the ED, however there is no documentation to support this.  He reports compliance with all medications.  Past Medical History:  Diagnosis Date  . BPH (benign prostatic hyperplasia)   . Coronary artery disease    a. s/p PCI to LCx in 2007; b. MV 2017 no sig ischemia, EF 59%, nl study  . Depression   . GERD (gastroesophageal reflux disease)   . Hyperlipidemia   . Hypertension   . IBS (irritable bowel syndrome)   . PAF (paroxysmal atrial fibrillation) ([place])    a. CHADS2VASc => 5 (HTN, age x 1, stroke x 2, vascular disease); b. on Coumadin   . PNA (pneumonia)   . Skin cancer   . Sleep apnea   . Stroke [facility])    a. x 2 with residual left-sided weakness    Past Surgical History:  Procedure Laterality Date  . ADENOIDECTOMY   1951  . CARDIAC CATHETERIZATION  2006   X1 STENT  . SKIN CANCER EXCISION N/A 08/2016   Forehead.  [facility] (Dr. Deliah Boston)  . THYROIDECTOMY, PARTIAL      Current Meds  Medication Sig  . amiodarone (PACERONE) 200 MG tablet Take one daily (Patient taking differently: Take 200 mg by mouth daily. )  . aspirin 81 MG tablet Take 81 mg by mouth daily.  . bisoprolol (ZEBETA) 10 MG tablet Take 1.5 tablets (15 mg total) by mouth daily.  . Cholecalciferol (VITAMIN D) 2000 UNITS CAPS Take 2,000 Units by mouth daily.   . Coenzyme Q10 (CO Q 10 PO) Take 300 mg by mouth at bedtime.   . dicyclomine (BENTYL) 10 MG capsule Take 1 capsule (10 mg total) by mouth 2 (two) times daily with a meal. As needed. (Patient taking differently: Take 10  mg by mouth 2 (two) times daily with a meal. )  . DULoxetine (CYMBALTA) 20 MG capsule Take 40 mg by mouth daily.  . Eszopiclone 3 MG TABS Take 1 tablet (3 mg total) by mouth at bedtime. Take immediately before bedtime (Patient taking differently: Take 3 mg by mouth at bedtime. )  . Evolocumab (REPATHA SURECLICK) 778 MG/ML SOAJ Inject 140 mg into the skin every 14 (fourteen) days.  . furosemide (LASIX) 20 MG tablet TAKE 1 TABLET BY MOUTH EVERY DAY AS NEEDED  . hydrochlorothiazide (HYDRODIURIL) 12.5 MG tablet Take 1 tablet (12.5 mg total) by mouth daily.  Marland Kitchen lisinopril (PRINIVIL,ZESTRIL) 20 MG tablet TAKE 1 TABLET BY MOUTH TWICE A DAY (Patient taking differently: Take 20 mg by mouth daily. )  . nitroGLYCERIN (NITROSTAT) 0.4 MG SL tablet Place 1 tablet (0.4 mg total) every 5 (five) minutes as needed under the tongue for chest pain.  . ranitidine (ZANTAC) 150 MG tablet TAKE 1 TABLET (150 MG TOTAL) BY MOUTH 2 (TWO) TIMES DAILY. (Patient taking differently: Take 150 mg by mouth 2 (two) times daily. )  . triamcinolone cream (KENALOG) 0.5 % Apply 1 application topically 2 (two) times daily as needed. For rash (Patient taking differently: Apply 1 application topically  2 (two) times daily as needed (rash). )  . vitamin B-12 (CYANOCOBALAMIN) 1000 MCG tablet Take 1,000 mcg by mouth daily.  Marland Kitchen warfarin (COUMADIN) 7.5 MG tablet TAKE 1.5 TABLETS BY MOUTH ON MONDAY AND FRIDAY. REMAINDER OF WEEK 1 TABLET DAILY. (Patient taking differently: Take 3.75-7.5 mg by mouth See admin instructions. Take 7.5mg  on sundays, all other days take 3.75mg .)    Allergies:   Bactrim [sulfamethoxazole-trimethoprim] and Crestor [rosuvastatin calcium]   Social History:  The patient  reports that he has quit smoking. He has never used smokeless tobacco. He reports current alcohol use of about 2.0 - 3.0 standard drinks of alcohol per week. He reports that he does not use drugs.   Family History:  The patient's family history includes Arthritis in his mother; Cancer in his maternal aunt and mother; Heart Problems in his father; Heart disease in his father, paternal grandfather, and paternal grandmother; Hypertension in his father; Mental illness in his mother; Stroke in his maternal grandmother and paternal uncle; Sudden death in his maternal grandfather.  ROS:   Review of Systems  Constitutional: Positive for malaise/fatigue. Negative for chills, diaphoresis, fever and weight loss.  HENT: Negative for congestion.   Eyes: Negative for discharge and redness.  Respiratory: Negative for cough, hemoptysis, sputum production, shortness of breath and wheezing.   Cardiovascular: Negative for chest pain, palpitations, orthopnea, claudication, leg swelling and PND.  Gastrointestinal: Positive for nausea. Negative for abdominal pain, blood in stool, heartburn, melena and vomiting.  Genitourinary: Negative for hematuria.  Musculoskeletal: Negative for falls and myalgias.  Skin: Negative for rash.  Neurological: Positive for dizziness, focal weakness and weakness. Negative for tingling, tremors, sensory change, speech change and loss of consciousness.  Endo/Heme/Allergies: Does not bruise/bleed easily.    Psychiatric/Behavioral: Negative for substance abuse. The patient is not nervous/anxious.   All other systems reviewed and are negative.    PHYSICAL EXAM:  VS:  BP 126/60 (BP Location: Left Arm, Patient Position: Sitting, Cuff Size: Normal)   Pulse (!) 58   Wt 188 lb (85.3 kg)   BMI 24.80 kg/m  BMI: Body mass index is 24.8 kg/m.  Physical Exam  Constitutional: He is oriented to person, place, and time. He appears well-developed and well-nourished.  HENT:  Head: Normocephalic and atraumatic.  Eyes: Right eye exhibits no discharge. Left eye exhibits no discharge.  Neck: Normal range of motion. No JVD present.  Cardiovascular: Regular rhythm, S1 normal, S2 normal and normal heart sounds. Bradycardia present. Exam reveals no distant heart sounds, no friction rub, no midsystolic click and no opening snap.  No murmur heard. Pulses:      Posterior tibial pulses are 2+ on the right side and 2+ on the left side.  Pulmonary/Chest: Effort normal and breath sounds normal. No respiratory distress. He has no decreased breath sounds. He has no wheezes. He has no rales. He exhibits no tenderness.  Abdominal: Soft. He exhibits no distension. There is no abdominal tenderness.  Musculoskeletal:        General: No edema.     Comments: 4-5 left lower extremity strength.  5 out of 5 right lower extremity strength  Neurological: He is alert and oriented to person, place, and time.  Skin: Skin is warm and dry. No cyanosis. Nails show no clubbing.  Psychiatric: He has a normal mood and affect. His speech is normal and behavior is normal. Judgment and thought content normal.     EKG:  Was ordered and interpreted by me today. Shows sinus bradycardia, 58 bpm, possible prior septal infarct, no acute st/t changes (not changed from prior)  Recent Labs: 12/24/2017: TSH 1.52 08/02/2018: ALT 31; BUN 15; Creatinine, Ser 1.33; Hemoglobin 15.3; Platelets 206; Potassium 4.1; Sodium 138  09/30/2017: Cholesterol 152;  HDL 62.50; LDL Cholesterol 58; Total CHOL/HDL Ratio 2; Triglycerides 162.0; VLDL 32.4   Estimated Creatinine Clearance: 56.7 mL/min (A) (by C-G formula based on SCr of 1.33 mg/dL (H)).   Wt Readings from Last 3 Encounters:  08/04/18 188 lb (85.3 kg)  07/07/18 190 lb (86.2 kg)  06/16/18 191 lb (86.6 kg)     Other studies reviewed: Additional studies/records reviewed today include: summarized above  ASSESSMENT AND PLAN:  1. Ataxia: The patient indicates he felt similar to his baseline however his wife noted he was having a difficult time staying in his lane while they were driving to work causing her to have him pull over so she could finish the drive.  Following this, the patient indicates that several coworkers stated he did not look well though the patient did not feel different from his baseline.  The patient's wife has indicated she felt like the left lower extremity weakness was more pronounced prompting his ED visit as above.  Since being evaluated in the ED he has been back to baseline.  They do report he did sleep a significant amount of time following the ED evaluation.  There does not appear to be a specific cardiac etiology to the patient's symptoms.  Given this, I cannot provide a note that will allow the patient to drive/return to work.  He will need to be evaluated by neurology for clearance as this is not a primary cardiac issue.  Labs including urine drug screen, BMP, HFP, CBC, and recent TSH have all been unrevealing.  CT of the head/MRI of the brain unrevealing.  We have reached out to several different neurology clinics in an effort to expedite his evaluation.  2. CAD involving the native coronary arteries without angina: No symptoms concerning for angina.  The above symptoms did not feel similar to his prior MI in 2007 which was associated with crushing chest pain that radiated to the jaw.  He remains on aspirin and Coumadin per primary cardiologist.  Continue bisoprolol and  Repatha.  No plans for ischemic evaluation at this time.  3. PAF: He remains in sinus rhythm with a mildly bradycardic heart rate.  He does note some nausea with amiodarone though this is improved somewhat when taking the medicine with food.  Given this and his mildly bradycardic heart rates, we will decrease his amiodarone to 100 mg daily.  He will otherwise remain on bisoprolol.  He remains on Coumadin which is monitored by his PCP.  Given the patient and his wife report several RNs told him he was in A. fib while in the ED this past week (there is no documentation to support this) we will repeat outpatient cardiac monitoring to evaluate for A. fib burden while on antiarrhythmic therapy.  4. History of strokes: Remains on aspirin and Coumadin.  Followed by neurology.  Aggressive risk factor modification.  5. Hypertension: Blood pressure is well controlled.  Continue current medications.  6. Hyperlipidemia: Statin intolerant.  Remains on Repatha.  7. Somnolence: Likely multifactorial including sleep apnea noncompliant with therapy, depression, and ongoing alcohol abuse.  Recommend he follow-up with PCP.  Disposition: F/u with Dr. Rockey Situ in 3 months.  Current medicines are reviewed at length with the patient today.  The patient did not have any concerns regarding medicines.  Signed, Christell Faith, PA-C 08/04/2018 1:34 PM     [address], [place] [id number] [phone]

## 2018-08-08 ENCOUNTER — Telehealth: Payer: Self-pay | Admitting: Primary Care

## 2018-08-08 NOTE — Telephone Encounter (Signed)
Called [facility] and asked them to put patient on cancellation list, unfortunately there are no sooner appointments at this time.

## 2018-08-08 NOTE — Telephone Encounter (Signed)
Spoken and notified patient of Kate Clark's comments. Patient verbalized understanding.  

## 2018-08-08 NOTE — Telephone Encounter (Signed)
Best number [phone]  Pt called needing a release letter to go back to work.  This is from the er visit on Thursday.  Pt is aware you may require him to come in for a visit prior to being released.  He stated everyone released him but didn't give him  A note

## 2018-08-08 NOTE — Telephone Encounter (Signed)
[name], Based off of the office note from cardiology dating 08/05/18, they cannot clear him to return to work or drive given his recent neurological symptoms. It appears that they are trying to get him in with neurology now. Has he heard anything regarding an appointment? I see where something was scheduled for early April, we can try to get him in sooner. I will send this to [facility].   I will also cc the cardiology provider who mentioned this in his note. Philippa Chester.  Rosaria Ferries, anyway we can get him in sooner with neuro?

## 2018-08-09 ENCOUNTER — Other Ambulatory Visit: Payer: Self-pay | Admitting: Primary Care

## 2018-08-09 DIAGNOSIS — K589 Irritable bowel syndrome without diarrhea: Secondary | ICD-10-CM

## 2018-08-09 NOTE — Telephone Encounter (Signed)
Last prescribed on 05/17/2018  . Last office visit on 06/13/2018. No future appointment

## 2018-08-09 NOTE — Telephone Encounter (Signed)
Noted.  Refill sent to pharmacy. 

## 2018-08-10 ENCOUNTER — Telehealth: Payer: Self-pay | Admitting: Cardiovascular Disease

## 2018-08-10 NOTE — Telephone Encounter (Signed)
Pt called back, he is frustrated that he is getting the run around about getting released to return to work. Pt states he "is missing out on money everyday". I advised him of the note below and told him I would pass along but we were unable to move neuro appt but he is on the waitlist for cancels. He asked if anyone could offer any more help he would greatly appreciate it. He said he will go anywhere.

## 2018-08-10 NOTE — Telephone Encounter (Signed)
New Message:   Patient concering a note for his work. Please call patient

## 2018-08-10 NOTE — Telephone Encounter (Signed)
Please notify patient that I understand his frustration and I'm willing to evaluate him anytime tomorrow in order to return to work.

## 2018-08-10 NOTE — Telephone Encounter (Signed)
Returned the call to the patient. He stated that he needed a return to work letter. According to his last visit, he would need a visit with a neurologist first. He stated that he cannot wait since they are living off of social security.   According to Epic, he has also been working with his PCP on this as well and they may be able to get him in for an appointment tomorrow.  The patient has been advised to call if anything further is needed.

## 2018-08-11 ENCOUNTER — Ambulatory Visit (INDEPENDENT_AMBULATORY_CARE_PROVIDER_SITE_OTHER): Payer: PPO | Admitting: General Practice

## 2018-08-11 DIAGNOSIS — Z7901 Long term (current) use of anticoagulants: Secondary | ICD-10-CM | POA: Diagnosis not present

## 2018-08-11 DIAGNOSIS — I4891 Unspecified atrial fibrillation: Secondary | ICD-10-CM

## 2018-08-11 DIAGNOSIS — Z8673 Personal history of transient ischemic attack (TIA), and cerebral infarction without residual deficits: Secondary | ICD-10-CM

## 2018-08-11 LAB — POCT INR: INR: 2.5 (ref 2.0–3.0)

## 2018-08-11 NOTE — Telephone Encounter (Signed)
I spoke with pt and set up 3/6 appt

## 2018-08-11 NOTE — Patient Instructions (Addendum)
Pre visit review using our clinic review tool, if applicable. No additional management support is needed unless otherwise documented below in the visit note.  Continue to take 1/2 tablet (3.75mg ) daily EXCEPT for 1 tablet on Sundays.  Re-check in 4 weeks.

## 2018-08-12 ENCOUNTER — Ambulatory Visit (INDEPENDENT_AMBULATORY_CARE_PROVIDER_SITE_OTHER): Payer: PPO | Admitting: Primary Care

## 2018-08-12 ENCOUNTER — Encounter: Payer: Self-pay | Admitting: Primary Care

## 2018-08-12 DIAGNOSIS — R531 Weakness: Secondary | ICD-10-CM | POA: Diagnosis not present

## 2018-08-12 DIAGNOSIS — Z8673 Personal history of transient ischemic attack (TIA), and cerebral infarction without residual deficits: Secondary | ICD-10-CM | POA: Diagnosis not present

## 2018-08-12 NOTE — Assessment & Plan Note (Signed)
Evaluated in the ED at [facility] recently for concerns of increased weakness. Work up was grossly unremarkable. All notes, labs, imaging reviewed.  Neuro exam today without concern, no acute symptoms. I do not see why he cannot return to work or driving so I will release him. He will follow up with neurology as scheduled.

## 2018-08-12 NOTE — Assessment & Plan Note (Signed)
Chronic, recent evaluation at [facility], work up grossly unremarkable. Neuro exam today unremarkable. Cleared to return to work.

## 2018-08-12 NOTE — Patient Instructions (Signed)
You may return to work and driving.  Follow up with the neurologist as scheduled.  It was a pleasure to see you today!

## 2018-08-12 NOTE — Progress Notes (Signed)
Subjective:    Patient ID: [name], male    DOB: [birth year], 73 y.o.   MRN: [id number]  HPI  [name] is a 73 year old male who presents today requesting clearance to return to work. He would also like to discuss coming off of some medications  He presented to [facility] on 08/02/18 with complaints of feeling off balance, increased weakness to left side, mild headache. His BP was noted to be elevated at home prior to arrival. Prior history of CVA 13 years prior. He underwent work up for potential recurrent stroke. CT head was negative, MRI of the brain was negative for stroke. There was mention of a chronic cerebellar hemorrhage so neurology was contacted who didn't feel this warranted further evaluation in the hospital.. ECG without findings of atrial fibrillation and labs grossly unremarkable. BP was under good control.  He followed up with cardiology on 08/04/18 with requests to return to work. During that visit he endorsed feeling back to baseline from prior stroke. He endorsed nausea since beginning amiodarone. His wife apparently mentioned that prior to his ED evaluation that he has had a hard time staying inside the lanes when driving to work, had to pull over and have his wife take him. His wife also mentioned that his left lower extremity weakness was more pronounced. After his ED visit he endorsed being back to baseline. Cardiology was unable to clear patient to return to work until evaluated by his neurologist.   Today he drove to the office and did well. He's been driving since his visit to the ED. He denies difficulty driving now. He walking the dogs every evening, chronic left sided weakness from years ago. He denies new weakness, changes in speech, dizziness, chest pain, visual changes. 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 He has an appointment scheduled with neurology in early April 2020.   He hasn't noticed improvement in GI symptoms despite  dicyclomine and ranitidine. He also stopped his Lunesta as he hasn't noticed improvement.   BP Readings from Last 3 Encounters:  08/12/18 130/64  08/04/18 126/60  08/02/18 (!) 159/97     Review of Systems  Respiratory: Negative for shortness of breath.   Cardiovascular: Negative for chest pain.  Neurological: Negative for dizziness and headaches.       Denies increased weakness       Past Medical History:  Diagnosis Date  . BPH (benign prostatic hyperplasia)   . Coronary artery disease    a. s/p PCI to LCx in 2007; b. MV 2017 no sig ischemia, EF 59%, nl study  . Depression   . GERD (gastroesophageal reflux disease)   . Hyperlipidemia   . Hypertension   . IBS (irritable bowel syndrome)   . PAF (paroxysmal atrial fibrillation) ([place])    a. CHADS2VASc => 5 (HTN, age x 1, stroke x 2, vascular disease); b. on Coumadin   . PNA (pneumonia)   . Skin cancer   . Sleep apnea   . Stroke [facility])    a. x 2 with residual left-sided weakness     Social History   Socioeconomic History  . Marital status: Married    Spouse name: Not on file  . Number of children: Not on file  . Years of education: Not on file  . Highest education level: Not on file  Occupational History  . Not on file  Social Needs  . Financial resource strain: Not hard at all  .  Food insecurity:    Worry: Never true    Inability: Never true  . Transportation needs:    Medical: No    Non-medical: No  Tobacco Use  . Smoking status: Former Research scientist (life sciences)  . Smokeless tobacco: Never Used  Substance and Sexual Activity  . Alcohol use: Yes    Alcohol/week: 2.0 - 3.0 standard drinks    Types: 2 - 3 Glasses of wine per week    Comment: Wine daily with dinner  . Drug use: No  . Sexual activity: Never  Lifestyle  . Physical activity:    Days per week: Not on file    Minutes per session: Not on file  . Stress: Not on file  Relationships  . Social connections:    Talks on phone: Not on file    Gets together: Not on  file    Attends religious service: Not on file    Active member of club or organization: Not on file    Attends meetings of clubs or organizations: Not on file    Relationship status: Not on file  . Intimate partner violence:    Fear of current or ex partner: No    Emotionally abused: No    Physically abused: No    Forced sexual activity: No  Other Topics Concern  . Not on file  Social History Narrative  . Not on file    Past Surgical History:  Procedure Laterality Date  . ADENOIDECTOMY  1951  . CARDIAC CATHETERIZATION  2006   X1 STENT  . SKIN CANCER EXCISION N/A 08/2016   Forehead.  [facility] (Dr. Deliah Boston)  . THYROIDECTOMY, PARTIAL      Family History  Problem Relation Age of Onset  . Heart Problems Father   . Heart disease Father   . Hypertension Father   . Mental illness Mother   . Arthritis Mother   . Cancer Mother        ovarian  . Stroke Paternal Uncle   . Heart disease Paternal Grandfather   . Cancer Maternal Aunt   . Stroke Maternal Grandmother   . Sudden death Maternal Grandfather   . Heart disease Paternal Grandmother     Allergies  Allergen Reactions  . Bactrim [Sulfamethoxazole-Trimethoprim] Anaphylaxis    Ulcers  . Crestor [Rosuvastatin Calcium]     Severe Myalgias    Current Outpatient Medications on File Prior to Visit  Medication Sig Dispense Refill  . amiodarone (PACERONE) 100 MG tablet Take 1 tablet (100 mg total) by mouth daily. 90 tablet 3  . aspirin 81 MG tablet Take 81 mg by mouth daily.    . bisoprolol (ZEBETA) 10 MG tablet Take 1.5 tablets (15 mg total) by mouth daily. 135 tablet 0  . Cholecalciferol (VITAMIN D) 2000 UNITS CAPS Take 2,000 Units by mouth daily.     . Coenzyme Q10 (CO Q 10 PO) Take 300 mg by mouth at bedtime.     . DULoxetine (CYMBALTA) 20 MG capsule Take 40 mg by mouth daily.    . Evolocumab (REPATHA SURECLICK) 099 MG/ML SOAJ Inject 140 mg into the skin every 14 (fourteen) days. 2 pen 11  .  furosemide (LASIX) 20 MG tablet TAKE 1 TABLET BY MOUTH EVERY DAY AS NEEDED 30 tablet 5  . hydrochlorothiazide (HYDRODIURIL) 12.5 MG tablet Take 1 tablet (12.5 mg total) by mouth daily. 90 tablet 0  . lisinopril (PRINIVIL,ZESTRIL) 20 MG tablet TAKE 1 TABLET BY MOUTH TWICE A DAY (Patient taking differently:  Take 20 mg by mouth daily. ) 180 tablet 3  . nitroGLYCERIN (NITROSTAT) 0.4 MG SL tablet Place 1 tablet (0.4 mg total) every 5 (five) minutes as needed under the tongue for chest pain. 25 tablet 3  . triamcinolone cream (KENALOG) 0.5 % Apply 1 application topically 2 (two) times daily as needed. For rash (Patient taking differently: Apply 1 application topically 2 (two) times daily as needed (rash). ) 30 g 0  . vitamin B-12 (CYANOCOBALAMIN) 1000 MCG tablet Take 1,000 mcg by mouth daily.    Marland Kitchen warfarin (COUMADIN) 7.5 MG tablet TAKE 1.5 TABLETS BY MOUTH ON MONDAY AND FRIDAY. REMAINDER OF WEEK 1 TABLET DAILY. (Patient taking differently: Take 3.75-7.5 mg by mouth See admin instructions. Take 7.5mg  on sundays, all other days take 3.75mg .) 96 tablet 1   No current facility-administered medications on file prior to visit.     BP 130/64   Pulse 65   Temp 98 F (36.7 C) (Oral)   Ht 6\' 1"  (1.854 m)   Wt 185 lb (83.9 kg)   SpO2 97%   BMI 24.41 kg/m    Objective:   Physical Exam  Constitutional: He is oriented to person, place, and time. He appears well-nourished.  Eyes: EOM are normal.  Cardiovascular: Normal rate and regular rhythm.  Neurological: He is alert and oriented to person, place, and time. He has normal reflexes. No cranial nerve deficit.  Grip slightly weaker to left, this is not a new finding. No slurred speech, facial drooping, arm drift.   Skin: Skin is warm and dry.           Assessment & Plan:

## 2018-08-15 ENCOUNTER — Telehealth: Payer: Self-pay | Admitting: Primary Care

## 2018-08-15 ENCOUNTER — Encounter: Payer: Self-pay | Admitting: Primary Care

## 2018-08-15 ENCOUNTER — Encounter: Payer: Self-pay | Admitting: *Deleted

## 2018-08-15 NOTE — Telephone Encounter (Signed)
Ready for pick up. Placed in [place].

## 2018-08-15 NOTE — Telephone Encounter (Signed)
Spoken to patient earlier today and inform that letter is ready for pick up. Left in the front office.

## 2018-08-15 NOTE — Telephone Encounter (Signed)
Best number [phone]  Pt called stating he needs a letter stating it is ok for him to drive.  ( He drives cars at work)   Please advise when ready for pick up

## 2018-08-20 DIAGNOSIS — G4733 Obstructive sleep apnea (adult) (pediatric): Secondary | ICD-10-CM | POA: Diagnosis not present

## 2018-09-07 ENCOUNTER — Telehealth: Payer: Self-pay | Admitting: Pulmonary Disease

## 2018-09-07 NOTE — Telephone Encounter (Signed)
Dawna Part- inspire is the name of the stimulator device Dr. Melida Quitter of [facility] is a provider for this-we will need a formal consult and evaluation to see if you are a candidate  Patient also advised that he stoped using his bipap machine he did not find benefit from using it.  Sening to AO as FYI

## 2018-09-08 ENCOUNTER — Other Ambulatory Visit (INDEPENDENT_AMBULATORY_CARE_PROVIDER_SITE_OTHER): Payer: PPO

## 2018-09-08 ENCOUNTER — Ambulatory Visit (INDEPENDENT_AMBULATORY_CARE_PROVIDER_SITE_OTHER): Payer: PPO | Admitting: General Practice

## 2018-09-08 ENCOUNTER — Other Ambulatory Visit: Payer: Self-pay

## 2018-09-08 DIAGNOSIS — Z7901 Long term (current) use of anticoagulants: Secondary | ICD-10-CM

## 2018-09-08 DIAGNOSIS — I4891 Unspecified atrial fibrillation: Secondary | ICD-10-CM

## 2018-09-08 DIAGNOSIS — Z8673 Personal history of transient ischemic attack (TIA), and cerebral infarction without residual deficits: Secondary | ICD-10-CM | POA: Diagnosis not present

## 2018-09-08 LAB — POCT INR: INR: 3 (ref 2.0–3.0)

## 2018-09-08 NOTE — Patient Instructions (Signed)
Pre visit review using our clinic review tool, if applicable. No additional management support is needed unless otherwise documented below in the visit note.  Continue to take 1/2 tablet (3.75mg ) daily EXCEPT for 1 tablet on Sundays.  Re-check in 4 to 5 weeks.

## 2018-09-08 NOTE — Telephone Encounter (Signed)
Its okay to send the referral, not sure they are seeing outpatients with ongoing COVID

## 2018-09-08 NOTE — Telephone Encounter (Signed)
Dr. Ander Slade, please advise if you are fine with [place] placing the referral to ENT for pt to see Dr. Redmond Baseman to get the device he is wanting. Thanks!

## 2018-09-08 NOTE — Telephone Encounter (Signed)
Order already placed nothing further needed.

## 2018-09-12 ENCOUNTER — Ambulatory Visit: Payer: PPO | Admitting: Neurology

## 2018-09-14 ENCOUNTER — Ambulatory Visit (INDEPENDENT_AMBULATORY_CARE_PROVIDER_SITE_OTHER): Payer: PPO | Admitting: Primary Care

## 2018-09-14 ENCOUNTER — Other Ambulatory Visit: Payer: Self-pay

## 2018-09-14 DIAGNOSIS — K219 Gastro-esophageal reflux disease without esophagitis: Secondary | ICD-10-CM | POA: Diagnosis not present

## 2018-09-14 MED ORDER — OMEPRAZOLE 40 MG PO CPDR: 40.0000 mg | DELAYED_RELEASE_CAPSULE | Freq: Every day | ORAL | 0 refills | Status: DC

## 2018-09-14 NOTE — Progress Notes (Signed)
Subjective:    Patient ID: [name], male    DOB: 01-13-1946, 73 y.o.   MRN: [id number]  HPI  Virtual Visit via Video Note  I connected with [name] on 09/14/18 at 11:20 AM EDT by a video enabled telemedicine application and verified that I am speaking with the correct person using two identifiers.   I discussed the limitations of evaluation and management by telemedicine and the availability of in person appointments. The patient expressed understanding and agreed to proceed. He is at home, I am in the office.  History of Present Illness:   Mr. Perleberg is a 73 year old male with a history of CAD, atrial fibrillation, COPD, GERD, IBS, CVA who presents today via video with a chief complaint of esophageal reflux.  He was previously managed on omeprazole 20 mg OTC for which he took for 14 days. He sent an update via my chart on 09/08/18 with reports of improvement in nausea and GERD symptoms. He also endorsed that he stopped his dicyclomine and hadn't had any problems. We discussed to stop omeprazole to see if GERD symptoms returned, and if GERD symptoms returned then he could resume omeprazole.  Today he endorses a moderate amount of esophageal burning with reflux of acidic contents, worse at night. He's also noticed epigastric tenderness. He actually didn't stop the omeprazole 20 mg OTC as discussed. He is sleeping on several pillows at night to give his body an angle. He does drink 2 glasses of wine with dinner every night, will sometimes lay down within 2 hours of eating and drinking wine.  Observations/Objective:  Appears well. No distress. Speaking in complete sentences.  Assessment and Plan:  HPI suggestive of uncontrolled GERD. Will increase dose of omeprazole to 40 mg, Rx sent to pharmacy. Also discussed triggers for GERD including alcohol and discussed to refrain from laying flat within 2 hours after eating/drinking. Continue to elevate body when sleeping.  He will update in 1-2 weeks.  Follow Up Instructions:  Stop omeprazole 20 mg. You may double up on your 20 mg tablets to make 40 mg until your bottle is empty.  Start omeprazole 40 mg. I sent this prescription to your pharmacy.  Try to limit triggers for acid reflux including caffeine, alcohol, laying flat within 2 hours after eating, spicy/acidic/greasy food, chocolate.   Please send me an update in 1-2 weeks.  It was nice to see you! Allie Bossier, NP-C    I discussed the assessment and treatment plan with the patient. The patient was provided an opportunity to ask questions and all were answered. The patient agreed with the plan and demonstrated an understanding of the instructions.   The patient was advised to call back or seek an in-person evaluation if the symptoms worsen or if the condition fails to improve as anticipated.     Pleas Koch, NP   Review of Systems  Constitutional: Negative for fever.  Respiratory: Negative for shortness of breath.   Cardiovascular: Negative for chest pain.  Gastrointestinal: Positive for abdominal pain and nausea. Negative for diarrhea and vomiting.       Esophageal burning and reflux       Past Medical History:  Diagnosis Date  . BPH (benign prostatic hyperplasia)   . Coronary artery disease    a. s/p PCI to LCx in 2007; b. MV 2017 no sig ischemia, EF 59%, nl study  . Depression   . GERD (gastroesophageal reflux disease)   . Hyperlipidemia   .  Hypertension   . IBS (irritable bowel syndrome)   . PAF (paroxysmal atrial fibrillation) ([place])    a. CHADS2VASc => 5 (HTN, age x 1, stroke x 2, vascular disease); b. on Coumadin   . PNA (pneumonia)   . Skin cancer   . Sleep apnea   . Stroke [facility])    a. x 2 with residual left-sided weakness     Social History   Socioeconomic History  . Marital status: Married    Spouse name: Not on file  . Number of children: Not on file  . Years of education: Not on file  . Highest  education level: Not on file  Occupational History  . Not on file  Social Needs  . Financial resource strain: Not hard at all  . Food insecurity:    Worry: Never true    Inability: Never true  . Transportation needs:    Medical: No    Non-medical: No  Tobacco Use  . Smoking status: Former Research scientist (life sciences)  . Smokeless tobacco: Never Used  Substance and Sexual Activity  . Alcohol use: Yes    Alcohol/week: 2.0 - 3.0 standard drinks    Types: 2 - 3 Glasses of wine per week    Comment: Wine daily with dinner  . Drug use: No  . Sexual activity: Never  Lifestyle  . Physical activity:    Days per week: Not on file    Minutes per session: Not on file  . Stress: Not on file  Relationships  . Social connections:    Talks on phone: Not on file    Gets together: Not on file    Attends religious service: Not on file    Active member of club or organization: Not on file    Attends meetings of clubs or organizations: Not on file    Relationship status: Not on file  . Intimate partner violence:    Fear of current or ex partner: No    Emotionally abused: No    Physically abused: No    Forced sexual activity: No  Other Topics Concern  . Not on file  Social History Narrative  . Not on file    Past Surgical History:  Procedure Laterality Date  . ADENOIDECTOMY  1951  . CARDIAC CATHETERIZATION  2006   X1 STENT  . SKIN CANCER EXCISION N/A 08/2016   Forehead.  [facility] (Dr. Deliah Boston)  . THYROIDECTOMY, PARTIAL      Family History  Problem Relation Age of Onset  . Heart Problems Father   . Heart disease Father   . Hypertension Father   . Mental illness Mother   . Arthritis Mother   . Cancer Mother        ovarian  . Stroke Paternal Uncle   . Heart disease Paternal Grandfather   . Cancer Maternal Aunt   . Stroke Maternal Grandmother   . Sudden death Maternal Grandfather   . Heart disease Paternal Grandmother     Allergies  Allergen Reactions  . Bactrim  [Sulfamethoxazole-Trimethoprim] Anaphylaxis    Ulcers  . Crestor [Rosuvastatin Calcium]     Severe Myalgias    Current Outpatient Medications on File Prior to Visit  Medication Sig Dispense Refill  . amiodarone (PACERONE) 100 MG tablet Take 1 tablet (100 mg total) by mouth daily. 90 tablet 3  . aspirin 81 MG tablet Take 81 mg by mouth daily.    . bisoprolol (ZEBETA) 10 MG tablet Take 1.5 tablets (15 mg  total) by mouth daily. 135 tablet 0  . Cholecalciferol (VITAMIN D) 2000 UNITS CAPS Take 2,000 Units by mouth daily.     . Coenzyme Q10 (CO Q 10 PO) Take 300 mg by mouth at bedtime.     . DULoxetine (CYMBALTA) 20 MG capsule Take 40 mg by mouth daily.    . Evolocumab (REPATHA SURECLICK) 863 MG/ML SOAJ Inject 140 mg into the skin every 14 (fourteen) days. 2 pen 11  . furosemide (LASIX) 20 MG tablet TAKE 1 TABLET BY MOUTH EVERY DAY AS NEEDED 30 tablet 5  . hydrochlorothiazide (HYDRODIURIL) 12.5 MG tablet Take 1 tablet (12.5 mg total) by mouth daily. 90 tablet 0  . lisinopril (PRINIVIL,ZESTRIL) 20 MG tablet TAKE 1 TABLET BY MOUTH TWICE A DAY (Patient taking differently: Take 20 mg by mouth daily. ) 180 tablet 3  . nitroGLYCERIN (NITROSTAT) 0.4 MG SL tablet Place 1 tablet (0.4 mg total) every 5 (five) minutes as needed under the tongue for chest pain. 25 tablet 3  . triamcinolone cream (KENALOG) 0.5 % Apply 1 application topically 2 (two) times daily as needed. For rash (Patient taking differently: Apply 1 application topically 2 (two) times daily as needed (rash). ) 30 g 0  . vitamin B-12 (CYANOCOBALAMIN) 1000 MCG tablet Take 1,000 mcg by mouth daily.    Marland Kitchen warfarin (COUMADIN) 7.5 MG tablet TAKE 1.5 TABLETS BY MOUTH ON MONDAY AND FRIDAY. REMAINDER OF WEEK 1 TABLET DAILY. (Patient taking differently: Take 3.75-7.5 mg by mouth See admin instructions. Take 7.5mg  on sundays, all other days take 3.75mg .) 96 tablet 1   No current facility-administered medications on file prior to visit.     There were no  vitals taken for this visit.   Objective:   Physical Exam  Constitutional: He is oriented to person, place, and time. He appears well-nourished.  Respiratory: Effort normal.  Neurological: He is alert and oriented to person, place, and time.  Psychiatric: He has a normal mood and affect.           Assessment & Plan:

## 2018-09-14 NOTE — Assessment & Plan Note (Signed)
HPI suggestive of uncontrolled GERD. Will increase dose of omeprazole to 40 mg, Rx sent to pharmacy. Also discussed triggers for GERD including alcohol and discussed to refrain from laying flat within 2 hours after eating/drinking. Continue to elevate body when sleeping. He will update in 1-2 weeks.

## 2018-09-14 NOTE — Patient Instructions (Signed)
Stop omeprazole 20 mg. You may double up on your 20 mg tablets to make 40 mg until your bottle is empty.  Start omeprazole 40 mg. I sent this prescription to your pharmacy.  Try to limit triggers for acid reflux including caffeine, alcohol, laying flat within 2 hours after eating, spicy/acidic/greasy food, chocolate.   Please send me an update in 1-2 weeks.  It was nice to see you! Allie Bossier, NP-C

## 2018-09-20 DIAGNOSIS — G4733 Obstructive sleep apnea (adult) (pediatric): Secondary | ICD-10-CM | POA: Diagnosis not present

## 2018-09-21 ENCOUNTER — Telehealth: Payer: Self-pay | Admitting: Family Medicine

## 2018-09-21 DIAGNOSIS — K219 Gastro-esophageal reflux disease without esophagitis: Secondary | ICD-10-CM

## 2018-09-21 DIAGNOSIS — I10 Essential (primary) hypertension: Secondary | ICD-10-CM

## 2018-09-21 MED ORDER — PANTOPRAZOLE SODIUM 40 MG PO TBEC: 40.0000 mg | DELAYED_RELEASE_TABLET | Freq: Every day | ORAL | 0 refills | Status: DC

## 2018-09-21 NOTE — Telephone Encounter (Signed)
Please notify patient that he is on the highest dose of omeprazole at 40 mg.  If no improvement on omeprazole 40 mg then we have two options:  1.Switch to pantoprazole.  2. Add in Pepcid 20 mg BID along with omeprazole 40 mg. This may not be a bad option since he just picked up a 90 day supply of omeprazole.  Let me know.

## 2018-09-21 NOTE — Telephone Encounter (Signed)
Spoken to patient. He stated this is correct he is taking 20 mg BID.  Also patient stated that omeprazole 40 mg is not working. Patient was wondering if he needs something different or change dosage.

## 2018-09-21 NOTE — Telephone Encounter (Signed)
Spoken and notified patient of [name] comments. Patient stated that he would like to switch to pantoprazole.

## 2018-09-21 NOTE — Telephone Encounter (Signed)
Will you confirm that he is taking lisinopril 20 mg BID? His last prescription was written by Dr. Lacinda Axon one year ago so I just wanted to be sure.

## 2018-09-21 NOTE — Telephone Encounter (Signed)
Noted. Rx for pantoprazole sent to pharmacy.

## 2018-09-22 MED ORDER — LISINOPRIL 40 MG PO TABS: 40.0000 mg | ORAL_TABLET | Freq: Every day | ORAL | 3 refills | Status: DC

## 2018-09-22 NOTE — Telephone Encounter (Signed)
Yes, I will change. Please make sure patient knows to take one tablet once daily as he will have the 40 mg dose.

## 2018-09-22 NOTE — Telephone Encounter (Signed)
Spoken and notified patient of Kate Clark's comments. Patient verbalized understanding.  

## 2018-09-22 NOTE — Telephone Encounter (Signed)
Received a fax from [facility] that lisinopril 20 mg is on backorder.  [facility] asked if they can change to 40 mg

## 2018-10-06 ENCOUNTER — Other Ambulatory Visit: Payer: Self-pay | Admitting: Family Medicine

## 2018-10-06 ENCOUNTER — Ambulatory Visit: Payer: PPO | Admitting: Pulmonary Disease

## 2018-10-06 ENCOUNTER — Ambulatory Visit: Payer: PPO | Admitting: Nurse Practitioner

## 2018-10-09 DIAGNOSIS — K219 Gastro-esophageal reflux disease without esophagitis: Secondary | ICD-10-CM

## 2018-10-12 ENCOUNTER — Other Ambulatory Visit: Payer: Self-pay

## 2018-10-12 ENCOUNTER — Ambulatory Visit (INDEPENDENT_AMBULATORY_CARE_PROVIDER_SITE_OTHER): Payer: PPO | Admitting: Gastroenterology

## 2018-10-12 ENCOUNTER — Encounter: Payer: Self-pay | Admitting: Gastroenterology

## 2018-10-12 ENCOUNTER — Telehealth: Payer: Self-pay

## 2018-10-12 VITALS — Ht 72.0 in | Wt 185.0 lb

## 2018-10-12 DIAGNOSIS — R112 Nausea with vomiting, unspecified: Secondary | ICD-10-CM

## 2018-10-12 DIAGNOSIS — R140 Abdominal distension (gaseous): Secondary | ICD-10-CM

## 2018-10-12 DIAGNOSIS — K219 Gastro-esophageal reflux disease without esophagitis: Secondary | ICD-10-CM

## 2018-10-12 DIAGNOSIS — Z86010 Personal history of colonic polyps: Secondary | ICD-10-CM | POA: Diagnosis not present

## 2018-10-12 MED ORDER — OMEPRAZOLE 40 MG PO CPDR: 40.0000 mg | DELAYED_RELEASE_CAPSULE | Freq: Two times a day (BID) | ORAL | 1 refills | Status: DC

## 2018-10-12 NOTE — Patient Instructions (Addendum)
Please increase your omeprazole 40 mg to twice daily, taking it 30-60 minutes before meals.   Avoid any dietary triggers.  Avoid spicy and acidic foods.  Limit your intake of coffee, tea, alcohol, and carbonated drinks.  Work to maintain a healthy weight.  Keep the head of the bed elevated with blocks if you are having any nighttime symptoms.  Stay upright for 2 hours after eating  Avoid meals and snacks three to four hours before bedtime  Will proceed with upper endoscopy for further evaluation of your reflux.   You could have a colonoscopy at the same time while you are off of your warfarin. Because you had a precancerous polyp in 2014, your next colonoscopy should be 5-7 years after that to be sure that you have grown any additional polyps.  These procedures will be done at [address] On the 4th floor. We have set you up a pre-visit appointment to go over your procedure instructions on May 18th at 2:00pm over the phone.  You will be contaced by our office prior to your procedure for directions on holding your Coumadin/Warfarin.  If you do not hear from our office 1 week prior to your scheduled procedure, please call [phone] to discuss.   I have recommended an ultrasound for further evaluation of your symptoms. [facility] will reach out to you to set this appointment up. There phone # is [phone].   Thank you for your patience with me and our technology today! Please stay home, safe, and healthy. I look forward to meeting you in person in the future.

## 2018-10-12 NOTE — Telephone Encounter (Signed)
[facility] Pre-operative Risk Assessment     Request for surgical clearance:     Endoscopy Procedure  What type of surgery is being performed?     EGD and Colonoscopy  When is this surgery scheduled?     11/03/2018  What type of clearance is required ?   Pharmacy  Are there any medications that need to be held prior to surgery and how long? Warfarin, 5 days  Practice name and name of physician performing surgery?      [facility]  What is your office phone and fax number?      Phone- [phone]  [phone]  Anesthesia type (None, local, MAC, general) ?       MAC

## 2018-10-12 NOTE — Progress Notes (Signed)
TELEHEALTH VISIT  Referring Provider: Pleas Koch, NP Primary Care Physician:  Pleas Koch, NP   Tele-visit due to COVID-19 pandemic Patient requested visit virtually, consented to the virtual encounter via video enabled telemedicine application (Doxim[employer]) Contact made at: 13:35 10/11/28 Patient verified by name and date of birth Location of patient: Home Location provider: [facility] Names of persons participating: Me, patient, wife, Patti PJ Martinique CMA Time spent on telehealth visit: 28 minutes I discussed the limitations of evaluation and management by telemedicine. The patient expressed understanding and agreed to proceed.  Reason for Consultation:  GERD   IMPRESSION:  Reflux not responding to PPI therapy Nausea Bloating Hiatal hernia on EGD 2014 On coumadin for atrial fibrillation (Dr. Rockey Situ) History of colon polyps    - tubular adenoma on 2014   I have recommended an EGD with esophageal and gastric biopsies given the differential for reflux not responding to PPI therapy including  of reflux esophagitis, persistent H pylori, PUD, gastritis, and non-erosive reflux disease. Plan PPI BID. Will also obtain an ultrasound to evaluate for pancreaticobiliary source of symptoms.   I have recommended holding warfarin before endoscopy.  I discussed with the patient that there is a low, but real, risk of a cardiovascular event such as heart attack, stroke, or embolism/thrombosis while off warfarin. Will communicate by phone or EMR with patient's prescribing provider to confirm that holding the warfarin is appropriate at this time.   As he is due surveillance colonoscopy, I recommended that this be performed at the same time to minimize his time off of warfarin. He was going to consider this option after discussing it further with his wife.   PLAN: Abdominal ultrasound Increase omeprazole to 40 mg twice daily (3 months with 1 refill provided) Lifestyle  modifications EGD after a warfarin washout if approved Dr. Rockey Situ I offered a colonoscopy for surveillance at the same time as he will be off of the warfarin  I consented the patient at the bedside today discussing the risks, benefits, and alternatives to endoscopic evaluation. In particular, we discussed the risks that include, but are not limited to, reaction to medication, cardiopulmonary compromise, bleeding requiring blood transfusion, aspiration resulting in pneumonia, perforation requiring surgery, lack of diagnosis, severe illness requiring hospitalization, and even death. We reviewed the risk of missed lesion including polyps or even cancer. The patient acknowledges these risks and asks that we proceed.   HPI: [name] is a 73 y.o. male referred by NP Clark for further evaluation of GERD. The history is obtained through the patient, his wife, and review of her electronic health record.  GERD x 3 years, worse since January. Most symptoms in the morning and at night. Associated nausea, brash,  and bloating. No dysphagia or odynophagia. No dysphonia, cough, sore throat.  Some improvement with breakfast and his morning dose of omeprazole. Some nocturnal symptoms. Exacerbated by stress.  Slight decrease in appetite. Weight stable. No iron deficiency anemia.   Raised the head of his bed 3 inches. Trying to eat no more than 2-3 hours before bedtime.  Trial of ranitidine for 3 years. Switched to omeprazole when ranitidine was removed. Switched to pantoprazole with even less success. Resumed omeprazole 40 mg. Still having daily symptoms.   He had an EGD and colonoscopy 2014 [facility] in [place], [place]. He had a 29mm polyp in the ascending colon removed and surveillance in 5-10 years.   Cousin with a hiatal hernia with similar symptoms. She has required  the use of omeprazole BID. No known family history of colon cancer or polyps. No family history of uterine/endometrial cancer, pancreatic  cancer or gastric/stomach cancer.  Labs 08/02/18: Hgb 15.3, MCV 92.6, RDW 13.2  Past Medical History:  Diagnosis Date  . BPH (benign prostatic hyperplasia)   . Coronary artery disease    a. s/p PCI to LCx in 2007; b. MV 2017 no sig ischemia, EF 59%, nl study  . Depression   . GERD (gastroesophageal reflux disease)   . Hyperlipidemia   . Hypertension   . IBS (irritable bowel syndrome)   . PAF (paroxysmal atrial fibrillation) ([place])    a. CHADS2VASc => 5 (HTN, age x 1, stroke x 2, vascular disease); b. on Coumadin   . PNA (pneumonia)   . Skin cancer   . Sleep apnea   . Stroke [facility])    a. x 2 with residual left-sided weakness    Past Surgical History:  Procedure Laterality Date  . ADENOIDECTOMY  1951  . CARDIAC CATHETERIZATION  2006   X1 STENT  . SKIN CANCER EXCISION N/A 08/2016   Forehead.  [facility] (Dr. Deliah Boston)  . THYROIDECTOMY, PARTIAL      Current Outpatient Medications  Medication Sig Dispense Refill  . amiodarone (PACERONE) 100 MG tablet Take 1 tablet (100 mg total) by mouth daily. 90 tablet 3  . aspirin 81 MG tablet Take 81 mg by mouth daily.    . bisoprolol (ZEBETA) 10 MG tablet TAKE 1.5 TABLETS BY MOUTH EVERY DAY 135 tablet 0  . Cholecalciferol (VITAMIN D) 2000 UNITS CAPS Take 2,000 Units by mouth daily.     . Coenzyme Q10 (CO Q 10 PO) Take 300 mg by mouth at bedtime.     . DULoxetine (CYMBALTA) 20 MG capsule Take 20 mg by mouth daily.     . Evolocumab (REPATHA SURECLICK) 952 MG/ML SOAJ Inject 140 mg into the skin every 14 (fourteen) days. 2 pen 11  . hydrochlorothiazide (HYDRODIURIL) 12.5 MG tablet Take 1 tablet (12.5 mg total) by mouth daily. 90 tablet 0  . lisinopril (PRINIVIL,ZESTRIL) 40 MG tablet Take 1 tablet (40 mg total) by mouth daily. For blood pressure. 90 tablet 3  . nitroGLYCERIN (NITROSTAT) 0.4 MG SL tablet Place 1 tablet (0.4 mg total) every 5 (five) minutes as needed under the tongue for chest pain. 25 tablet 3  . omeprazole  (PRILOSEC) 40 MG capsule Take 1 capsule (40 mg total) by mouth 2 (two) times daily before a meal. For heartburn. 180 capsule 1  . triamcinolone cream (KENALOG) 0.5 % Apply 1 application topically 2 (two) times daily as needed. For rash (Patient taking differently: Apply 1 application topically 2 (two) times daily as needed (rash). ) 30 g 0  . vitamin B-12 (CYANOCOBALAMIN) 1000 MCG tablet Take 1,000 mcg by mouth daily.    Marland Kitchen warfarin (COUMADIN) 7.5 MG tablet TAKE 1.5 TABLETS BY MOUTH ON MONDAY AND FRIDAY. REMAINDER OF WEEK 1 TABLET DAILY. (Patient taking differently: Take 3.75-7.5 mg by mouth See admin instructions. Take 7.5mg  on sundays, all other days take 3.75mg .) 96 tablet 1   No current facility-administered medications for this visit.     Allergies as of 10/12/2018 - Review Complete 10/12/2018  Allergen Reaction Noted  . Bactrim [sulfamethoxazole-trimethoprim] Anaphylaxis 01/23/2015  . Crestor [rosuvastatin calcium]  02/11/2016    Family History  Problem Relation Age of Onset  . Heart Problems Father   . Heart disease Father   . Hypertension Father   .  Mental illness Mother   . Arthritis Mother   . Cancer Mother        ovarian  . Stroke Paternal Uncle   . Heart disease Paternal Grandfather   . Cancer Maternal Aunt   . Stroke Maternal Grandmother   . Sudden death Maternal Grandfather   . Heart disease Paternal Grandmother   . Colon cancer Neg Hx   . Esophageal cancer Neg Hx   . Rectal cancer Neg Hx     Social History   Socioeconomic History  . Marital status: Married    Spouse name: Not on file  . Number of children: 1  . Years of education: Not on file  . Highest education level: Not on file  Occupational History  . Occupation: retired  Scientific laboratory technician  . Financial resource strain: Not hard at all  . Food insecurity:    Worry: Never true    Inability: Never true  . Transportation needs:    Medical: No    Non-medical: No  Tobacco Use  . Smoking status: Former  Research scientist (life sciences)  . Smokeless tobacco: Never Used  Substance and Sexual Activity  . Alcohol use: Yes    Alcohol/week: 2.0 - 3.0 standard drinks    Types: 2 - 3 Glasses of wine per week    Comment: Wine daily with dinner  . Drug use: No  . Sexual activity: Never  Lifestyle  . Physical activity:    Days per week: Not on file    Minutes per session: Not on file  . Stress: Not on file  Relationships  . Social connections:    Talks on phone: Not on file    Gets together: Not on file    Attends religious service: Not on file    Active member of club or organization: Not on file    Attends meetings of clubs or organizations: Not on file    Relationship status: Not on file  . Intimate partner violence:    Fear of current or ex partner: No    Emotionally abused: No    Physically abused: No    Forced sexual activity: No  Other Topics Concern  . Not on file  Social History Narrative  . Not on file    Review of Systems: ALL ROS discussed and all others negative except listed in HPI.  Physical Exam: General: in no acute distress Neuro: Alert and appropriate Psych: Normal affect and normal insight   Jyll Tomaro L. Tarri Glenn, MD, MPH [facility] 10/12/2018, 7:00 PM

## 2018-10-13 ENCOUNTER — Ambulatory Visit (INDEPENDENT_AMBULATORY_CARE_PROVIDER_SITE_OTHER): Payer: PPO | Admitting: General Practice

## 2018-10-13 ENCOUNTER — Telehealth: Payer: Self-pay | Admitting: General Practice

## 2018-10-13 ENCOUNTER — Other Ambulatory Visit: Payer: Self-pay | Admitting: General Practice

## 2018-10-13 ENCOUNTER — Other Ambulatory Visit (INDEPENDENT_AMBULATORY_CARE_PROVIDER_SITE_OTHER): Payer: PPO

## 2018-10-13 DIAGNOSIS — I4891 Unspecified atrial fibrillation: Secondary | ICD-10-CM

## 2018-10-13 DIAGNOSIS — Z8673 Personal history of transient ischemic attack (TIA), and cerebral infarction without residual deficits: Secondary | ICD-10-CM

## 2018-10-13 DIAGNOSIS — Z7901 Long term (current) use of anticoagulants: Secondary | ICD-10-CM

## 2018-10-13 LAB — POCT INR: INR: 2.1 (ref 2.0–3.0)

## 2018-10-13 MED ORDER — ENOXAPARIN SODIUM 120 MG/0.8ML ~~LOC~~ SOLN: 120.0000 mg | SUBCUTANEOUS | 0 refills | Status: DC

## 2018-10-13 NOTE — Telephone Encounter (Signed)
   Primary Cardiologist: Ida Rogue, MD  Chart reviewed as part of pre-operative protocol coverage.  [name] was last seen on 08/04/18 by Christell Faith PAC.  Requesting office would like clarification on holding coumadin with lovenox bridge.   Per our pharmacy staff: Patient with diagnosis of afib and stroke on coumadin for anticoagulation.    Procedure: EGD/colonoscopy Date of procedure: 11/03/2018  CHADS2-VASc score of  5 (CHF, HTN, AGE, DM2, stroke/tia x 2, CAD, AGE, male)  CrCl 48ml/min Platelet count 210  Per office protocol, patient can hold warfarin for 5 days prior to procedure.   Patient WILL need bridging with Lovenox (enoxaparin) around procedure.  Patient coumadin is managed by Meriam Sprague at his PCP office. I will forward to her for coordination.  Meriam Sprague at [facility] office has been notified to arrange lovenox bridge.    I will route this recommendation to the requesting party via Epic fax function and remove from pre-op pool.  Please call with questions.  [place], PA 10/13/2018, 9:49 AM

## 2018-10-13 NOTE — Telephone Encounter (Signed)
-----   Message from Pleas Koch, NP sent at 10/13/2018  2:00 PM EDT ----- Regarding: RE: Lovenox bridge Hi Cindy!  Yes, thank you so very much!  Allie Bossier, NP-C ----- Message ----- From: Warden Fillers, RN Sent: 10/13/2018   1:27 PM EDT To: Pleas Koch, NP Subject: Lovenox bridge                                 Brien Mates,  Patient is having a procedure on 5/28 and will need to be bridged with Lovenox.  Do I have your permission to dose this and send to pharmacy?  Please advise.  Thanks! Villa Herb, RN

## 2018-10-13 NOTE — Patient Instructions (Signed)
Pre visit review using our clinic review tool, if applicable. No additional management support is needed unless otherwise documented below in the visit note.  5/23 - Last dose of coumadin until after procedure 5/24 - Nothing (No coumadin or Lovenox) 5/25 - Lovenox in AM 5/26 - Lovenox in AM 5/27 - Lovenox in AM (Please take by 7am) 5/28 - Procedure (DO NOT TAKE LOVENOX TODAY) 5/29 - Lovenox in the AM and 1 1/2 tablets of coumadin 5/30 - Lovenox in the AM and 1 1/2 tablets of coumadin 5/31 - Lovenox in the AM and 1 tablet of coumadin 6/1 - Lovenox in the AM and 1 tablet of coumadin 6/2 - Check INR

## 2018-10-13 NOTE — Telephone Encounter (Signed)
Patient with diagnosis of afib and stroke on coumadin for anticoagulation.    Procedure: EGD/colonoscopy Date of procedure: 11/03/2018  CHADS2-VASc score of  5 (CHF, HTN, AGE, DM2, stroke/tia x 2, CAD, AGE, male)  CrCl 46ml/min Platelet count 210  Per office protocol, patient can hold warfarin for 5 days prior to procedure.   Patient WILL need bridging with Lovenox (enoxaparin) around procedure.  Patient coumadin is managed by Meriam Sprague at his PCP office. I will forward to her for coordination.

## 2018-10-13 NOTE — Telephone Encounter (Signed)
Mandy,   Can you please take care of this?

## 2018-10-13 NOTE — Telephone Encounter (Signed)
I did confirm with patient that [name] communicated with him regarding his instructions and also reviewed Lovenox plan with him.  I will be mailing him all instructions and will follow up with him regarding lovenox plan and next recheck appointment.   Thanks to all involved.

## 2018-10-15 ENCOUNTER — Other Ambulatory Visit: Payer: Self-pay | Admitting: Primary Care

## 2018-10-15 DIAGNOSIS — K219 Gastro-esophageal reflux disease without esophagitis: Secondary | ICD-10-CM

## 2018-10-17 DIAGNOSIS — F411 Generalized anxiety disorder: Secondary | ICD-10-CM | POA: Diagnosis not present

## 2018-10-17 DIAGNOSIS — F330 Major depressive disorder, recurrent, mild: Secondary | ICD-10-CM | POA: Diagnosis not present

## 2018-10-18 NOTE — Telephone Encounter (Signed)
Reviewed and agree to plan.

## 2018-10-18 NOTE — Telephone Encounter (Signed)
Noted.  Patient will require a Lovenox bridge for his procedure on 5/28.   His calculated CrCl is 59.5 which makes him appropriate for 1.5mg /kg q 24hr dosing which equals 120mg  Lovenox q 24hours.   Bridge and Boost plan as follows:  5/23 - Last dose of coumadin until after procedure 5/24 - Nothing (No coumadin or Lovenox) 5/25 - Lovenox in AM 5/26 - Lovenox in AM 5/27 - Lovenox in AM (Please take by 7am) 5/28 - Procedure (DO NOT TAKE LOVENOX TODAY) 5/29 - Lovenox in the AM and 1 1/2 tablets of coumadin 5/30 - Lovenox in the AM and 1 1/2 tablets of coumadin 5/31 - Lovenox in the AM and 1 tablet of coumadin 6/1 - Lovenox in the AM and 1 tablet of coumadin 6/2 - Check INR  This plan has been reviewed with patient and I am mailing him a written calendar plan today to reinforce directions.   Appears Lovenox has already been ordered by provider for above dosing on 10/13/18.    FYI to K. Carlis Abbott, NP and Villa Herb, RN coumadin nurse.  Thanks.

## 2018-10-24 ENCOUNTER — Other Ambulatory Visit: Payer: Self-pay | Admitting: Primary Care

## 2018-10-24 ENCOUNTER — Other Ambulatory Visit: Payer: Self-pay

## 2018-10-24 ENCOUNTER — Encounter: Payer: Self-pay | Admitting: Gastroenterology

## 2018-10-24 ENCOUNTER — Ambulatory Visit: Payer: PPO | Admitting: *Deleted

## 2018-10-24 VITALS — Ht 72.0 in | Wt 185.0 lb

## 2018-10-24 DIAGNOSIS — K219 Gastro-esophageal reflux disease without esophagitis: Secondary | ICD-10-CM

## 2018-10-24 DIAGNOSIS — Z86010 Personal history of colonic polyps: Secondary | ICD-10-CM

## 2018-10-24 MED ORDER — NA SULFATE-K SULFATE-MG SULF 17.5-3.13-1.6 GM/177ML PO SOLN: 1.0000 | Freq: Once | ORAL | 0 refills | Status: AC

## 2018-10-24 NOTE — Progress Notes (Signed)
No egg or soy allergy known to patient  No issues with past sedation with any surgeries  or procedures, no intubation problems  No diet pills per patient No home 02 use per patient  Pt is on  blood thinners per patient - on Coumadin- pt is to have a lovenox bridge and hold Coumadin x 5 days prior to colon/ egd  Pt denies issues with constipation  Hx of  A fib   Pt to pick  instruction packet to included paper to complete and mail back to [facility] with addressed and stamped envelope, Emmi video, copy of consent form to read and not return, and instructions. PV completed over the phone. Pt encouraged to call with questions or issues   Pt will pick up Suprep Sample 3rd floor- with mask   Suprep sample Lot [id number] exp 07/2020

## 2018-10-26 ENCOUNTER — Other Ambulatory Visit: Payer: Self-pay | Admitting: Primary Care

## 2018-10-26 DIAGNOSIS — K219 Gastro-esophageal reflux disease without esophagitis: Secondary | ICD-10-CM

## 2018-10-27 DIAGNOSIS — Z86010 Personal history of colonic polyps: Secondary | ICD-10-CM

## 2018-10-27 DIAGNOSIS — K219 Gastro-esophageal reflux disease without esophagitis: Secondary | ICD-10-CM

## 2018-10-27 DIAGNOSIS — R112 Nausea with vomiting, unspecified: Secondary | ICD-10-CM

## 2018-10-27 DIAGNOSIS — R140 Abdominal distension (gaseous): Secondary | ICD-10-CM

## 2018-10-28 ENCOUNTER — Ambulatory Visit: Payer: PPO

## 2018-10-28 ENCOUNTER — Ambulatory Visit: Payer: PPO | Admitting: Family Medicine

## 2018-11-01 ENCOUNTER — Other Ambulatory Visit: Payer: Self-pay | Admitting: Primary Care

## 2018-11-01 ENCOUNTER — Telehealth: Payer: Self-pay

## 2018-11-01 DIAGNOSIS — I4891 Unspecified atrial fibrillation: Secondary | ICD-10-CM

## 2018-11-01 DIAGNOSIS — Z7901 Long term (current) use of anticoagulants: Secondary | ICD-10-CM

## 2018-11-01 NOTE — Telephone Encounter (Signed)
Noted  

## 2018-11-01 NOTE — Telephone Encounter (Signed)
Patient called to report that his colonoscopy has been rescheduled for 6/19.  Please see new update instructions below.  I did review these with patient and he has updated his printout that he received at home.    Recheck lab appointments have been made for 11/10/18 for routine INR check and for 6/24 for INR check post procedure.    6/14 - Last dose of coumadin until after procedure 6/15 - Nothing (No coumadin or Lovenox) 6/16 - Lovenox in AM 6/17 - Lovenox in AM 6/18 - Lovenox in AM (Please take by 7am) 6/19 - Procedure (DO NOT TAKE LOVENOX TODAY) 6/20 - Lovenox in the AM and 1 1/2 tablets of coumadin 6/21 - Lovenox in the AM and 1 1/2 tablets of coumadin 6/22 - Lovenox in the AM and 1 tablet of coumadin 6/23 - Lovenox in the AM and 1 tablet of coumadin 6/24 - Check INR  FYI to PCP and to Villa Herb, RN coumadin clinic nurse.

## 2018-11-02 NOTE — Telephone Encounter (Signed)
Patient is compliant with coumadin management, will refill.

## 2018-11-02 NOTE — Telephone Encounter (Signed)
Noted and agree to changes.

## 2018-11-03 ENCOUNTER — Encounter: Payer: PPO | Admitting: Gastroenterology

## 2018-11-08 ENCOUNTER — Other Ambulatory Visit: Payer: PPO

## 2018-11-09 ENCOUNTER — Telehealth: Payer: Self-pay

## 2018-11-09 NOTE — Telephone Encounter (Signed)

## 2018-11-10 ENCOUNTER — Encounter: Payer: Self-pay | Admitting: *Deleted

## 2018-11-10 ENCOUNTER — Telehealth: Payer: Self-pay | Admitting: *Deleted

## 2018-11-10 ENCOUNTER — Other Ambulatory Visit: Payer: PPO

## 2018-11-10 ENCOUNTER — Other Ambulatory Visit: Payer: Self-pay

## 2018-11-10 ENCOUNTER — Ambulatory Visit
Admission: RE | Admit: 2018-11-10 | Discharge: 2018-11-10 | Disposition: A | Discharge status: Home / Self Care | Payer: PPO | Source: Ambulatory Visit | Attending: Gastroenterology | Admitting: Gastroenterology

## 2018-11-10 DIAGNOSIS — K760 Fatty (change of) liver, not elsewhere classified: Secondary | ICD-10-CM | POA: Diagnosis not present

## 2018-11-10 DIAGNOSIS — K219 Gastro-esophageal reflux disease without esophagitis: Secondary | ICD-10-CM

## 2018-11-10 DIAGNOSIS — R140 Abdominal distension (gaseous): Secondary | ICD-10-CM

## 2018-11-10 DIAGNOSIS — R112 Nausea with vomiting, unspecified: Secondary | ICD-10-CM

## 2018-11-10 NOTE — Telephone Encounter (Signed)
Notified patient of results of abd [place], patient reported he has not received instructions concerning his endo/colon even though instruction in a letter were printed and presumably mailed on 5/12. This RN copied and pasted instructions in a patient MyChart message. Patient notified of this and reported he would review.

## 2018-11-14 DIAGNOSIS — D2272 Melanocytic nevi of left lower limb, including hip: Secondary | ICD-10-CM | POA: Diagnosis not present

## 2018-11-14 DIAGNOSIS — L538 Other specified erythematous conditions: Secondary | ICD-10-CM | POA: Diagnosis not present

## 2018-11-14 DIAGNOSIS — D2261 Melanocytic nevi of right upper limb, including shoulder: Secondary | ICD-10-CM | POA: Diagnosis not present

## 2018-11-14 DIAGNOSIS — L218 Other seborrheic dermatitis: Secondary | ICD-10-CM | POA: Diagnosis not present

## 2018-11-14 DIAGNOSIS — D2262 Melanocytic nevi of left upper limb, including shoulder: Secondary | ICD-10-CM | POA: Diagnosis not present

## 2018-11-14 DIAGNOSIS — L820 Inflamed seborrheic keratosis: Secondary | ICD-10-CM | POA: Diagnosis not present

## 2018-11-14 DIAGNOSIS — Z85820 Personal history of malignant melanoma of skin: Secondary | ICD-10-CM | POA: Diagnosis not present

## 2018-11-14 DIAGNOSIS — L570 Actinic keratosis: Secondary | ICD-10-CM | POA: Diagnosis not present

## 2018-11-14 DIAGNOSIS — X32XXXA Exposure to sunlight, initial encounter: Secondary | ICD-10-CM | POA: Diagnosis not present

## 2018-11-16 NOTE — Progress Notes (Signed)
Virtual Visit via Telephone Note   This visit type was conducted due to national recommendations for restrictions regarding the COVID-19 Pandemic (e.g. social distancing) in an effort to limit this patient's exposure and mitigate transmission in our community.  Due to his co-morbid illnesses, this patient is at least at moderate risk for complications without adequate follow up.  This format is felt to be most appropriate for this patient at this time.  The patient did not have access to video technology/had technical difficulties with video requiring transitioning to audio format only (telephone).  All issues noted in this document were discussed and addressed.  No physical exam could be performed with this format.  Please refer to the patient's chart for his  consent to telehealth for [facility].   I connected with  [name] on 11/17/18 by a video enabled telemedicine application and verified that I am speaking with the correct person using two identifiers. I discussed the limitations of evaluation and management by telemedicine. The patient expressed understanding and agreed to proceed.   Evaluation Performed:  Follow-up visit  Date:  11/17/2018   ID:  [name], DOB 1946-04-14, MRN [id number]  Patient Location:  [place] [address]   Provider location:   [facility], [place] office  PCP:  Pleas Koch, NP  Cardiologist:  Patsy Baltimore   Chief Complaint:  Leg weakness   History of Present Illness:    [name] is a 73 y.o. male who presents via audio/video conferencing for a telehealth visit today.   The patient does not symptoms concerning for COVID-19 infection (fever, chills, cough, or new SHORTNESS OF BREATH).   Patient has a past medical history of Chronic alcohol abuse,  wine per night 3 to 5+ per night PAF,  prior stroke treated with TPA with mild residual left sided weakness,  Depression with chronic  fatigue,  chronic anticoagulation therapy with Coumadin  h/o CAD s/p PCI to the LCx in 2007.  mini stroke in 2015 seen on PET scan, did not have symptoms and was on Coumadin at the time. We previously ordered stress test for chest pain, he did not complete this Presents for f/u of his CAD, chest pain  Doing gardening, no sx No palpitations or tachycardia No chest pain  Chronic leg pain, leg weakness Having falls  Colonoscopy and egd next Friday Coming off warfarin, lovenox bridging  Chronic insomnia  working with pulmonary in [place]  No regular exercise regiment Sleeps most of the day if he is not working Even after long night sleep is still tired Chronic exhaustion  He reports having abdominal bloating, weight gain More shortness of breath past several weeks  Reports having obstructive sleep apnea and central sleep apnea Tried bipap  Previously reported having weak legs, back discomfort, joint pain Deconditioned at baseline  event monitor  1 long stretch of atrial fibrillation lasting over 24 hours otherwise maintain normal sinus rhythm, 6% burden A. Fib  Other past medical history reviewed Zio monitor 01/2018 Paroxysmal atrial fibrillation occurred (6% burden), ranging from 48-171 bpm (avg of 84bpm), the longest lasting 17 hours 41 mins with an avg rate of 84 bpm. 34 Supraventricular Tachycardia/atrial tachycardia runs occurred, the run with the fastest interval lasting 8 beats with a max rate of 171 bpm, the longest lasting 12 beats with an avg rate of 93 bpm.  Isolated SVEs were rare (<1.0%), SVE Couplets were rare (<1.0%), and SVE Triplets were  rare (<1.0%). Isolated VEs were rare (<1.0%), and no VE Couplets or VE Triplets were present. Ventricular Bigeminy was present.  Previous retinal tear, needed laser treatment Concerned it was brought on by the Coumadin, ophthalmology did not think so per the patient  Previous stress test again reviewed with him  showing no ischemia Holter monitor reviewed with him showing APCs, PVCs, very short runs of atrial tachycardia up to 6 beats   Prior CV studies:   The following studies were reviewed today:   Past Medical History:  Diagnosis Date  . BPH (benign prostatic hyperplasia)   . Coronary artery disease    a. s/p PCI to LCx in 2007; b. MV 2017 no sig ischemia, EF 59%, nl study  . Depression   . GERD (gastroesophageal reflux disease)   . Hyperlipidemia   . Hypertension   . IBS (irritable bowel syndrome)   . Myocardial infarction ([place])    12-13 yrs ago   . PAF (paroxysmal atrial fibrillation) ([place])    a. CHADS2VASc => 5 (HTN, age x 1, stroke x 2, vascular disease); b. on Coumadin   . PNA (pneumonia)   . Skin cancer    face   . Sleep apnea    off cpap   . Stroke [facility])    a. x 2 with residual left-sided weakness   Past Surgical History:  Procedure Laterality Date  . ADENOIDECTOMY  1951  . CARDIAC CATHETERIZATION  2006   X1 STENT  . COLONOSCOPY    . POLYPECTOMY    . SKIN CANCER EXCISION N/A 08/2016   [place].  [facility] (Dr. Deliah Boston)  . THYROIDECTOMY, PARTIAL    . UPPER GASTROINTESTINAL ENDOSCOPY       No outpatient medications have been marked as taking for the 11/17/18 encounter (Appointment) with Minna Merritts, MD.     Allergies:   Bactrim [sulfamethoxazole-trimethoprim] and Crestor [rosuvastatin calcium]   Social History   Tobacco Use  . Smoking status: Former Research scientist (life sciences)  . Smokeless tobacco: Never Used  Substance Use Topics  . Alcohol use: Yes    Alcohol/week: 2.0 - 3.0 standard drinks    Types: 2 - 3 Glasses of wine per week    Comment: Wine daily with dinner  . Drug use: No     Current Outpatient Medications on File Prior to Visit  Medication Sig Dispense Refill  . amiodarone (PACERONE) 100 MG tablet Take 1 tablet (100 mg total) by mouth daily. 90 tablet 3  . aspirin 81 MG tablet Take 81 mg by mouth daily.    . bisoprolol (ZEBETA) 10 MG  tablet TAKE 1.5 TABLETS BY MOUTH EVERY DAY 135 tablet 0  . Cholecalciferol (VITAMIN D) 2000 UNITS CAPS Take 2,000 Units by mouth daily.     . Coenzyme Q10 (CO Q 10 PO) Take 300 mg by mouth at bedtime.     . DULoxetine (CYMBALTA) 20 MG capsule Take 20 mg by mouth daily.     Marland Kitchen enoxaparin (LOVENOX) 120 MG/0.8ML injection Inject 0.8 mLs (120 mg total) into the skin daily. 7 Syringe 0  . Evolocumab (REPATHA SURECLICK) 409 MG/ML SOAJ Inject 140 mg into the skin every 14 (fourteen) days. 2 pen 11  . hydrochlorothiazide (HYDRODIURIL) 12.5 MG tablet Take 1 tablet (12.5 mg total) by mouth daily. 90 tablet 0  . lisinopril (PRINIVIL,ZESTRIL) 40 MG tablet Take 1 tablet (40 mg total) by mouth daily. For blood pressure. 90 tablet 3  . LORazepam (ATIVAN) 0.5 MG tablet Take  0.5 mg by mouth 2 (two) times daily as needed.    . nitroGLYCERIN (NITROSTAT) 0.4 MG SL tablet Place 1 tablet (0.4 mg total) every 5 (five) minutes as needed under the tongue for chest pain. 25 tablet 3  . omeprazole (PRILOSEC) 40 MG capsule Take 1 capsule (40 mg total) by mouth 2 (two) times daily before a meal. For heartburn. (Patient not taking: Reported on 10/24/2018) 180 capsule 1  . pantoprazole (PROTONIX) 40 MG tablet TAKE 1 TABLET (40 MG TOTAL) BY MOUTH DAILY. FOR HEARTBURN. 30 tablet 0  . triamcinolone cream (KENALOG) 0.5 % Apply 1 application topically 2 (two) times daily as needed. For rash (Patient taking differently: Apply 1 application topically 2 (two) times daily as needed (rash). ) 30 g 0  . vitamin B-12 (CYANOCOBALAMIN) 1000 MCG tablet Take 1,000 mcg by mouth daily.    Marland Kitchen warfarin (COUMADIN) 7.5 MG tablet Take 0.5-1 tablets (3.75-7.5 mg total) by mouth See admin instructions. Take 7.5mg  on sundays, all other days take 3.75mg  or as directed by coumadin clinic. 108 tablet 1   No current facility-administered medications on file prior to visit.      Family Hx: The patient's family history includes Arthritis in his mother; Cancer  in his maternal aunt and mother; Heart Problems in his father; Heart disease in his father, paternal grandfather, and paternal grandmother; Hypertension in his father; Mental illness in his mother; Stroke in his maternal grandmother and paternal uncle; Sudden death in his maternal grandfather. There is no history of Colon cancer, Esophageal cancer, Rectal cancer, Colon polyps, or Stomach cancer.  ROS:   Please see the history of present illness.    Review of Systems  Constitutional: Negative.   HENT: Negative.   Respiratory: Negative.   Cardiovascular: Negative.   Gastrointestinal: Positive for heartburn and nausea.  Musculoskeletal: Negative.   Neurological: Negative.   Psychiatric/Behavioral: Negative.   All other systems reviewed and are negative.    Labs/Other Tests and Data Reviewed:    Recent Labs: 12/24/2017: TSH 1.52 08/02/2018: ALT 31; BUN 15; Creatinine, Ser 1.33; Hemoglobin 15.3; Platelets 206; Potassium 4.1; Sodium 138   Recent Lipid Panel Lab Results  Component Value Date/Time   CHOL 152 09/30/2017 10:37 AM   TRIG 162.0 (H) 09/30/2017 10:37 AM   HDL 62.50 09/30/2017 10:37 AM   CHOLHDL 2 09/30/2017 10:37 AM   LDLCALC 58 09/30/2017 10:37 AM    Wt Readings from Last 3 Encounters:  10/24/18 185 lb (83.9 kg)  10/12/18 185 lb (83.9 kg)  08/12/18 185 lb (83.9 kg)     Exam:    Vital Signs: Vital signs may also be detailed in the HPI There were no vitals taken for this visit.  Wt Readings from Last 3 Encounters:  10/24/18 185 lb (83.9 kg)  10/12/18 185 lb (83.9 kg)  08/12/18 185 lb (83.9 kg)   Temp Readings from Last 3 Encounters:  08/12/18 98 F (36.7 C) (Oral)  08/02/18 97.6 F (36.4 C) (Oral)  06/13/18 (!) 97.5 F (36.4 C) (Oral)   BP Readings from Last 3 Encounters:  08/12/18 130/64  08/04/18 126/60  08/02/18 (!) 159/97   Pulse Readings from Last 3 Encounters:  08/12/18 65  08/04/18 (!) 58  08/02/18 (!) 54    160/96, before meds Pulse 60 resp  16  Well nourished, well developed male in no acute distress. Constitutional:  oriented to person, place, and time. No distress.   ASSESSMENT & PLAN:    Paroxysmal atrial fibrillation (  [place]) - Denies having any tachycardia or concern for atrial fibrillation On warfarin  Ataxia Prior stroke No medication changes made  Coronary artery disease involving native coronary artery of native heart without angina pectoris -  Currently with no symptoms of angina. No further workup at this time. Continue current medication regimen.  Encouraged regular exercise  History of stroke -  Receiving Lovenox bridge when he comes off warfarin for GI procedure  Essential hypertension -  Blood pressure running high but has not taken his morning medications He was start checking his blood pressure after he takes his pills typically is well controlled  Hyperlipidemia LDL goal <70 -  Cholesterol at goal  Somnolence -  History of alcohol, depression, sedentary lifestyle, insomnia  Dyspnea on exertion - Recommend regular walking program   COVID-19 Education: The signs and symptoms of COVID-19 were discussed with the patient and how to seek care for testing (follow up with PCP or arrange E-visit).  The importance of social distancing was discussed today.  Patient Risk:   After full review of this patients clinical status, I feel that they are at least moderate risk at this time.  Time:   Today, I have spent 25 minutes with the patient with telehealth technology discussing the cardiac and medical problems/diagnoses detailed above   10 min spent reviewing the chart prior to patient visit today   Medication Adjustments/Labs and Tests Ordered: Current medicines are reviewed at length with the patient today.  Concerns regarding medicines are outlined above.   Tests Ordered: No tests ordered   Medication Changes: No changes made   Disposition: Follow-up in 12 months   Signed, Ida Rogue,  MD  11/17/2018 10:11 AM    [place] Office [address] [place] [address], [place], [place] [address]

## 2018-11-17 ENCOUNTER — Telehealth (INDEPENDENT_AMBULATORY_CARE_PROVIDER_SITE_OTHER): Payer: PPO | Admitting: Cardiovascular Disease

## 2018-11-17 ENCOUNTER — Other Ambulatory Visit: Payer: Self-pay

## 2018-11-17 ENCOUNTER — Other Ambulatory Visit (INDEPENDENT_AMBULATORY_CARE_PROVIDER_SITE_OTHER): Payer: PPO

## 2018-11-17 ENCOUNTER — Ambulatory Visit (INDEPENDENT_AMBULATORY_CARE_PROVIDER_SITE_OTHER): Payer: PPO

## 2018-11-17 DIAGNOSIS — I4891 Unspecified atrial fibrillation: Secondary | ICD-10-CM | POA: Diagnosis not present

## 2018-11-17 DIAGNOSIS — E785 Hyperlipidemia, unspecified: Secondary | ICD-10-CM

## 2018-11-17 DIAGNOSIS — R0609 Other forms of dyspnea: Secondary | ICD-10-CM | POA: Diagnosis not present

## 2018-11-17 DIAGNOSIS — R400 Somnolence: Secondary | ICD-10-CM

## 2018-11-17 DIAGNOSIS — I25118 Atherosclerotic heart disease of native coronary artery with other forms of angina pectoris: Secondary | ICD-10-CM

## 2018-11-17 DIAGNOSIS — Z7901 Long term (current) use of anticoagulants: Secondary | ICD-10-CM | POA: Diagnosis not present

## 2018-11-17 DIAGNOSIS — R270 Ataxia, unspecified: Secondary | ICD-10-CM | POA: Diagnosis not present

## 2018-11-17 DIAGNOSIS — I10 Essential (primary) hypertension: Secondary | ICD-10-CM

## 2018-11-17 DIAGNOSIS — I480 Paroxysmal atrial fibrillation: Secondary | ICD-10-CM

## 2018-11-17 DIAGNOSIS — I6310 Cerebral infarction due to embolism of unspecified precerebral artery: Secondary | ICD-10-CM

## 2018-11-17 DIAGNOSIS — I2510 Atherosclerotic heart disease of native coronary artery without angina pectoris: Secondary | ICD-10-CM

## 2018-11-17 DIAGNOSIS — Z8673 Personal history of transient ischemic attack (TIA), and cerebral infarction without residual deficits: Secondary | ICD-10-CM | POA: Diagnosis not present

## 2018-11-17 LAB — POCT INR: INR: 2.2 (ref 2.0–3.0)

## 2018-11-17 NOTE — Patient Instructions (Addendum)
Pre visit review using our clinic review tool, if applicable. No additional management support is needed unless otherwise documented below in the visit note.  Continue to take 1/2 tablet (3.75mg ) daily EXCEPT for 1 tablet on Sundays.  Re-check on 6/24 after colonoscopy.

## 2018-11-17 NOTE — Patient Instructions (Signed)

## 2018-11-24 ENCOUNTER — Telehealth: Payer: Self-pay | Admitting: Gastroenterology

## 2018-11-24 NOTE — Telephone Encounter (Signed)
Patient answer no to all questions.

## 2018-11-24 NOTE — Telephone Encounter (Signed)

## 2018-11-25 ENCOUNTER — Other Ambulatory Visit: Payer: Self-pay

## 2018-11-25 ENCOUNTER — Encounter: Payer: Self-pay | Admitting: Gastroenterology

## 2018-11-25 ENCOUNTER — Ambulatory Visit (AMBULATORY_SURGERY_CENTER): Payer: PPO | Admitting: Gastroenterology

## 2018-11-25 VITALS — BP 127/79 | HR 58 | Temp 97.4°F | Resp 16 | Ht 72.0 in | Wt 185.0 lb

## 2018-11-25 DIAGNOSIS — K635 Polyp of colon: Secondary | ICD-10-CM

## 2018-11-25 DIAGNOSIS — K3189 Other diseases of stomach and duodenum: Secondary | ICD-10-CM | POA: Diagnosis not present

## 2018-11-25 DIAGNOSIS — K449 Diaphragmatic hernia without obstruction or gangrene: Secondary | ICD-10-CM

## 2018-11-25 DIAGNOSIS — Z1211 Encounter for screening for malignant neoplasm of colon: Secondary | ICD-10-CM | POA: Diagnosis not present

## 2018-11-25 DIAGNOSIS — K219 Gastro-esophageal reflux disease without esophagitis: Secondary | ICD-10-CM | POA: Diagnosis not present

## 2018-11-25 DIAGNOSIS — Z86010 Personal history of colonic polyps: Secondary | ICD-10-CM

## 2018-11-25 DIAGNOSIS — D120 Benign neoplasm of cecum: Secondary | ICD-10-CM

## 2018-11-25 DIAGNOSIS — K210 Gastro-esophageal reflux disease with esophagitis: Secondary | ICD-10-CM | POA: Diagnosis not present

## 2018-11-25 MED ORDER — SODIUM CHLORIDE 0.9 % IV SOLN: 500.0000 mL | Freq: Once | INTRAVENOUS | Status: DC

## 2018-11-25 NOTE — Op Note (Addendum)
[place] Patient Name: [name] Procedure Date: 11/25/2018 7:38 AM MRN: [id number] Endoscopist: Thornton Park MD, MD Age: 73 Referring MD:  Date of Birth: 04-02-1946 Gender: Male Account #: [id number] Procedure:                Upper GI endoscopy Indications:              Esophageal reflux symptoms that persist despite                            appropriate therapy                           Reflux not responding to PPI therapy                           Nausea                           Bloating                           Hiatal hernia on EGD 2014                           On coumadin for atrial fibrillation (Dr. Rockey Situ) Medicines:                See the Anesthesia note for documentation of the                            administered medications Procedure:                Pre-Anesthesia Assessment:                           - Prior to the procedure, a History and Physical                            was performed, and patient medications and                            allergies were reviewed. The patient's tolerance of                            previous anesthesia was also reviewed. The risks                            and benefits of the procedure and the sedation                            options and risks were discussed with the patient.                            All questions were answered, and informed consent                            was obtained. Prior Anticoagulants: The patient has  taken Coumadin (warfarin), last dose was 3 days                            prior to procedure. ASA Grade Assessment: III - A                            patient with severe systemic disease. After                            reviewing the risks and benefits, the patient was                            deemed in satisfactory condition to undergo the                            procedure.                           After obtaining informed consent, the  endoscope was                            passed under direct vision. Throughout the                            procedure, the patient's blood pressure, pulse, and                            oxygen saturations were monitored continuously. The                            Endoscope was introduced through the mouth, and                            advanced to the third part of duodenum. The upper                            GI endoscopy was accomplished without difficulty.                            The patient tolerated the procedure well. Scope In: Scope Out: Findings:                 The examined esophagus was normal except for a                            slightly irregular z-line. Biopsies were obtained                            from the proximal and distal esophagus with cold                            forceps for histology of suspected eosinophilic  esophagitis.                           Diffuse mildly erythematous mucosa was found in the                            gastric body and in the gastric antrum. Biopsies                            were taken with a cold forceps for histology.                           A small hiatal hernia was present.                           The examined duodenum was normal.                           The exam was otherwise without abnormality. Complications:            No immediate complications. Estimated blood loss:                            Minimal. Estimated Blood Loss:     Estimated blood loss was minimal. Impression:               - Slightly irregular z-line. Biopsied.                           - Erythematous mucosa in the gastric body and                            antrum. Biopsied.                           - Small hiatal hernia.                           - Normal examined duodenum.                           - The examination was otherwise normal. Recommendation:           - Patient has a contact number available for                             emergencies. The signs and symptoms of potential                            delayed complications were discussed with the                            patient. Return to normal activities tomorrow.                            Written discharge instructions were provided to the  patient.                           - Resume regular diet today.                           - Continue present medications.                           - Await pathology results.                           - No repeat upper endoscopy indicated at this time.                           - Follow up in the GI office to review these                            results. Thornton Park MD, MD 11/25/2018 8:12:41 AM This report has been signed electronically.

## 2018-11-25 NOTE — Op Note (Signed)
[place] Patient Name: [name] Procedure Date: 11/25/2018 7:37 AM MRN: [id number] Endoscopist: Thornton Park MD, MD Age: 73 Referring MD:  Date of Birth: 1946/04/26 Gender: Male Account #: [id number] Procedure:                Colonoscopy Indications:              Personal history of colon polyps                           On coumdin for atrial fibrillation (Dr. Rockey Situ)                           History of colon polyps                           - tubular adenoma on 2014 Medicines:                See the Anesthesia note for documentation of the                            administered medications Procedure:                Pre-Anesthesia Assessment:                           - Prior to the procedure, a History and Physical                            was performed, and patient medications and                            allergies were reviewed. The patient's tolerance of                            previous anesthesia was also reviewed. The risks                            and benefits of the procedure and the sedation                            options and risks were discussed with the patient.                            All questions were answered, and informed consent                            was obtained. Prior Anticoagulants: The patient has                            taken Coumadin (warfarin), last dose was 3 days                            prior to procedure. After reviewing the risks and  benefits, the patient was deemed in satisfactory                            condition to undergo the procedure.                           After obtaining informed consent, the colonoscope                            was passed under direct vision. Throughout the                            procedure, the patient's blood pressure, pulse, and                            oxygen saturations were monitored continuously. The   Colonoscope was introduced through the anus and                            advanced to the the terminal ileum, with                            identification of the appendiceal orifice and IC                            valve. The colonoscopy was performed without                            difficulty. The patient tolerated the procedure                            well. The quality of the bowel preparation was                            good. The terminal ileum, ileocecal valve,                            appendiceal orifice, and rectum were photographed. Scope In: 7:51:09 AM Scope Out: 8:06:59 AM Scope Withdrawal Time: 0 hours 10 minutes 32 seconds  Total Procedure Duration: 0 hours 15 minutes 50 seconds  Findings:                 Hemorrhoids were found on perianal exam.                           Multiple small and large-mouthed diverticula were                            found in the entire colon.                           A 6 mm polyp was found in the proximal ascending                            colon. The polyp was flat. The  polyp was removed                            with a cold snare. Resection and retrieval were                            complete. Estimated blood loss was minimal.                           Non-bleeding internal hemorrhoids were found. The                            hemorrhoids were medium-sized.                           The exam was otherwise without abnormality on                            direct and retroflexion views. Complications:            No immediate complications. Estimated blood loss:                            Minimal. Estimated Blood Loss:     Estimated blood loss was minimal. Impression:               - Hemorrhoids found on perianal exam.                           - Diverticulosis in the entire examined colon.                           - One 6 mm polyp in the proximal ascending colon,                            removed with a cold snare. Resected  and retrieved.                           - Non-bleeding internal hemorrhoids.                           - The examination was otherwise normal on direct                            and retroflexion views. Recommendation:           - Patient has a contact number available for                            emergencies. The signs and symptoms of potential                            delayed complications were discussed with the                            patient. Return to normal activities tomorrow.  Written discharge instructions were provided to the                            patient.                           - Resume regular diet today. High fiber diet                            recommended.                           - Resume Coumadin (warfarin) at prior dose                            tomorrow. Refer to managing physician for further                            adjustment of therapy.                           - Await pathology results.                           - Repeat colonoscopy in 7 years for surveillance if                            clinically appropriate at that time. Thornton Park MD, MD 11/25/2018 8:17:09 AM This report has been signed electronically.

## 2018-11-25 NOTE — Progress Notes (Signed)
VS- Riki Sheer Temp-[facility]ney [place]

## 2018-11-25 NOTE — Progress Notes (Signed)
PT taken to PACU. Monitors in place. VSS. Report given to RN. 

## 2018-11-25 NOTE — Patient Instructions (Signed)
YOU HAD AN ENDOSCOPIC PROCEDURE TODAY AT THE [facility]:   Refer to the procedure report that was given to you for any specific questions about what was found during the examination.  If the procedure report does not answer your questions, please call your gastroenterologist to clarify.  If you requested that your care partner not be given the details of your procedure findings, then the procedure report has been included in a sealed envelope for you to review at your convenience later.  YOU SHOULD EXPECT: Some feelings of bloating in the abdomen. Passage of more gas than usual.  Walking can help get rid of the air that was put into your GI tract during the procedure and reduce the bloating. If you had a lower endoscopy (such as a colonoscopy or flexible sigmoidoscopy) you may notice spotting of blood in your stool or on the toilet paper. If you underwent a bowel prep for your procedure, you may not have a normal bowel movement for a few days.  Please Note:  You might notice some irritation and congestion in your nose or some drainage.  This is from the oxygen used during your procedure.  There is no need for concern and it should clear up in a day or so.  SYMPTOMS TO REPORT IMMEDIATELY:   Following lower endoscopy (colonoscopy or flexible sigmoidoscopy):  Excessive amounts of blood in the stool  Significant tenderness or worsening of abdominal pains  Swelling of the abdomen that is new, acute  Fever of 100F or higher   Following upper endoscopy (EGD)  Vomiting of blood or coffee ground material  New chest pain or pain under the shoulder blades  Painful or persistently difficult swallowing  New shortness of breath  Fever of 100F or higher  Black, tarry-looking stools  For urgent or emergent issues, a gastroenterologist can be reached at any hour by calling [phone].   DIET:  We do recommend a small meal at first, but then you may proceed to your regular diet.  Drink  plenty of fluids but you should avoid alcoholic beverages for 24 hours.  ACTIVITY:  You should plan to take it easy for the rest of today and you should NOT DRIVE or use heavy machinery until tomorrow (because of the sedation medicines used during the test).    FOLLOW UP: Our staff will call the number listed on your records 48-72 hours following your procedure to check on you and address any questions or concerns that you may have regarding the information given to you following your procedure. If we do not reach you, we will leave a message.  We will attempt to reach you two times.  During this call, we will ask if you have developed any symptoms of COVID 19. If you develop any symptoms (ie: fever, flu-like symptoms, shortness of breath, cough etc.) before then, please call [phone].  If you test positive for Covid 19 in the 2 weeks post procedure, please call and report this information to us.    If any biopsies were taken you will be contacted by phone or by letter within the next 1-3 weeks.  Please call us at [phone] if you have not heard about the biopsies in 3 weeks.    SIGNATURES/CONFIDENTIALITY: You and/or your care partner have signed paperwork which will be entered into your electronic medical record.  These signatures attest to the fact that that the information above on your After Visit Summary has been reviewed and is   understood.  Full responsibility of the confidentiality of this discharge information lies with you and/or your care-partner.   Thank you for allowing us to provide your healthcare today. 

## 2018-11-25 NOTE — Progress Notes (Signed)
Called to room to assist during endoscopic procedure.  Patient ID and intended procedure confirmed with present staff. Received instructions for my participation in the procedure from the performing physician.  

## 2018-11-28 ENCOUNTER — Telehealth: Payer: Self-pay | Admitting: *Deleted

## 2018-11-28 NOTE — Telephone Encounter (Signed)
Called the patient to schedule an in person in office follow-up visit per Dr. Tarri Glenn procedure report.   Left a message for the patient to call back.

## 2018-11-29 ENCOUNTER — Telehealth: Payer: Self-pay

## 2018-11-29 NOTE — Telephone Encounter (Signed)
  Follow up Call-  Call back number 11/25/2018  Post procedure Call Back phone  # [phone]  Permission to leave phone message Yes  Some recent data might be hidden     Patient questions:  Do you have a fever, pain , or abdominal swelling? No. Pain Score  0 *  Have you tolerated food without any problems? Yes.    Have you been able to return to your normal activities? Yes.    Do you have any questions about your discharge instructions: Diet   No. Medications  No. Follow up visit  No.  Do you have questions or concerns about your Care? No.  Actions: * If pain score is 4 or above: No action needed, pain <4.  1. Have you developed a fever since your procedure? no  2.   Have you had an respiratory symptoms (SOB or cough) since your procedure? no  3.   Have you tested positive for COVID 19 since your procedure no  4.   Have you had any family members/close contacts diagnosed with the COVID 19 since your procedure?  no   If yes to any of these questions please route to Joylene John, RN and Alphonsa Gin, Therapist, sports.

## 2018-11-30 ENCOUNTER — Ambulatory Visit (INDEPENDENT_AMBULATORY_CARE_PROVIDER_SITE_OTHER): Payer: PPO

## 2018-11-30 ENCOUNTER — Other Ambulatory Visit (INDEPENDENT_AMBULATORY_CARE_PROVIDER_SITE_OTHER): Payer: PPO

## 2018-11-30 ENCOUNTER — Ambulatory Visit (INDEPENDENT_AMBULATORY_CARE_PROVIDER_SITE_OTHER): Payer: PPO | Admitting: General Practice

## 2018-11-30 ENCOUNTER — Encounter: Payer: Self-pay | Admitting: Gastroenterology

## 2018-11-30 DIAGNOSIS — Z23 Encounter for immunization: Secondary | ICD-10-CM | POA: Diagnosis not present

## 2018-11-30 DIAGNOSIS — Z7901 Long term (current) use of anticoagulants: Secondary | ICD-10-CM | POA: Diagnosis not present

## 2018-11-30 DIAGNOSIS — I4891 Unspecified atrial fibrillation: Secondary | ICD-10-CM

## 2018-11-30 DIAGNOSIS — Z8673 Personal history of transient ischemic attack (TIA), and cerebral infarction without residual deficits: Secondary | ICD-10-CM

## 2018-11-30 LAB — POCT INR: INR: 2.6 (ref 2.0–3.0)

## 2018-11-30 NOTE — Patient Instructions (Signed)
Pre visit review using our clinic review tool, if applicable. No additional management support is needed unless otherwise documented below in the visit note.  Continue to take 1/2 tablet (3.75mg ) daily EXCEPT for 1 tablet on Sundays.  Re-check in 6 weeks.  Dosing instructions given to patient and pt verbalized understanding.

## 2018-12-01 ENCOUNTER — Telehealth: Payer: Self-pay | Admitting: Neurology

## 2018-12-01 NOTE — Telephone Encounter (Signed)
Due to current COVID 19 pandemic, our office is severely reducing in office visits until further notice, in order to minimize the risk to our patients and healthcare providers.   I called patient to discuss a virtual visit for his appt on 6/29. LVM requesting patient call back. Office contact info included.

## 2018-12-01 NOTE — Telephone Encounter (Signed)
Patient returned my call and agreed to do an in office visit as directed by RN. Patient agrees to wear a mask and will follow safety precautions our office has in place. Patient states that his wife will need to come as well to help during the visit.

## 2018-12-01 NOTE — Telephone Encounter (Signed)
Thank you! Gait disorder and weakness complaints are best evaluated in the office.

## 2018-12-05 ENCOUNTER — Encounter

## 2018-12-05 ENCOUNTER — Ambulatory Visit (INDEPENDENT_AMBULATORY_CARE_PROVIDER_SITE_OTHER): Payer: PPO | Admitting: Neurology

## 2018-12-05 ENCOUNTER — Other Ambulatory Visit: Payer: Self-pay

## 2018-12-05 ENCOUNTER — Encounter: Payer: Self-pay | Admitting: Neurology

## 2018-12-05 VITALS — BP 161/84 | HR 59 | Ht 73.0 in | Wt 185.0 lb

## 2018-12-05 DIAGNOSIS — Z8673 Personal history of transient ischemic attack (TIA), and cerebral infarction without residual deficits: Secondary | ICD-10-CM

## 2018-12-05 DIAGNOSIS — R531 Weakness: Secondary | ICD-10-CM | POA: Diagnosis not present

## 2018-12-05 NOTE — Patient Instructions (Signed)
Continue exercising regularly and take your medications as directed. As discussed, secondary prevention is key after a stroke. Your recent MRI did not show any acute stroke.  Nevertheless, you have several stroke risk factors. Secondary prevention means: taking care of blood sugar values or diabetes management (A1c goal of less than 7.0), good blood pressure (hypertension) control and optimizing cholesterol management (with LDL goal of less than 70), exercising daily or regularly within your own mobility limitations of course, and overall cardiovascular risk factor reduction, which includes screening for and treatment of obstructive sleep apnea (OSA) and weight management.   I do not think we need to add any new medication.  We do not need to schedule a routine follow-up.  I would like to go ahead with a carotid Doppler ultrasound and we can call you with the results.  So long as the results are benign, I will see you back as needed.   As discussed, please try to hydrate well with water, 6 to 8 cups/day on average is still recommended.  Try to reduce your alcohol intake and do not drink every day, limit yourself to less than 1 glass of wine/day.

## 2018-12-05 NOTE — Progress Notes (Signed)
Subjective:    Patient ID: [name] is a 72 y.o. male.  HPI     Star Age, MD, PhD [facility] [address] [place], [place] [address]  [name] is a 73 year old right-handed gentleman with an underlying complex medical history of coronary artery disease, history of stroke with residual left-sided weakness, A. fib, BPH, depression, sleep apnea, hypertension, hyperlipidemia, reflux disease and borderline overweight state, who presents for a new problem visit as referred by the emergency room in February 2020 where he presented for left-sided weakness.  He is accompanied by his wife today.  I have previously evaluated him about a year ago for tremors.  Exam was negative for parkinsonism and he did not have any significant tremor at the time  Today, 12/05/2018: He reports that his weakness is better.  He does have ongoing issues with his left side since his stroke some 15 years ago.  His wife reports that he was noted to veer into another lane when he was driving, he was also not thinking clearly, seemed confused, she had him pull over and this prompted them to proceed to the emergency room.  He was seen in the emergency room on 08/02/2018 with left-sided weakness.  He had a brain MRI without contrast on 06/02/2019 and I reviewed the results: IMPRESSION: 1. No acute intracranial abnormality. 2. Small focus of chronic hemorrhage in the left cerebellum. 3. Chronic ischemic microangiopathy and generalized atrophy.  He had a head CT without contrast on 08/02/2018 and I reviewed the results: IMPRESSION: 1. No acute intracranial findings. 2. Atrophy and white matter microvascular disease.  He recently saw his cardiologist, he was deemed stable.  He has seen pulmonology for his sleep apnea but is no longer on CPAP and also tried BiPAP therapy but could not tolerate it.  He tries to hydrate fairly well, estimates that he drinks about 2 to  316 ounce bottles of water per day.  He does drink alcohol daily, per wife about 1 to 2 glasses of wine per evening.  He continues to have difficulty falling asleep.  Previously:   10/07/2017: 73 year old right-handed gentleman with an underlying medical history of hypertension, hyperlipidemia, reflux disease, sleep apnea, depression, coronary artery disease, BPH, A. fib, prior history of stroke, who reports an intermittent right hand tremor for the past year or so. I reviewed your office note from 09/16/2017.  Tremor is not very bothersome. He does not have a family history of tremors or Parkinson's disease. He reports that he had 2 strokes in the past, reports residual weakness on the left, has a cane but does not always use it. He had recent sleep study testing and is supposed to start PAP therapy.  He had a head CT without contrast on 07/16/2017 and I reviewed the results: IMPRESSION: No CT evidence for acute intracranial abnormality. Atrophy and mild small vessel ischemic changes of the white matter.    He states that he had a stroke some 12 years ago. He also has an intermittent tremor in the right hand. He is retired, he lives with his wife, they have 1 child. He quit smoking some 12 years ago, drinks alcohol in the form of wine, about 2 glasses per day on average and caffeine in the form of coffee and tea and sodas, he does not like to drink water. He does not do much in the way of formal exercise but walks their dogs.   Patient brought in  records from his previous doctors. He was previously evaluated with a neuropsychological evaluation in 2007 with motor and cognitive deficits consistent with prior stroke. He was diagnosed with right basal ganglia lacunar stroke in 2006. He was diagnosed with A. fib in 2006 and placed on Coumadin.  His Past Medical History Is Significant For: Past Medical History:  Diagnosis Date  . BPH (benign prostatic hyperplasia)   . Coronary artery disease    a. s/p  PCI to LCx in 2007; b. MV 2017 no sig ischemia, EF 59%, nl study  . Depression   . GERD (gastroesophageal reflux disease)   . Hyperlipidemia   . Hypertension   . IBS (irritable bowel syndrome)   . Myocardial infarction ([place])    12-13 yrs ago   . PAF (paroxysmal atrial fibrillation) ([place])    a. CHADS2VASc => 5 (HTN, age x 1, stroke x 2, vascular disease); b. on Coumadin   . PNA (pneumonia)   . Skin cancer    face   . Sleep apnea    off cpap   . Stroke [facility])    a. x 2 with residual left-sided weakness    His Past Surgical History Is Significant For: Past Surgical History:  Procedure Laterality Date  . ADENOIDECTOMY  1951  . CARDIAC CATHETERIZATION  2006   X1 STENT  . COLONOSCOPY    . POLYPECTOMY    . SKIN CANCER EXCISION N/A 08/2016   Forehead.  [facility] (Dr. Deliah Boston)  . THYROIDECTOMY, PARTIAL    . UPPER GASTROINTESTINAL ENDOSCOPY      His Family History Is Significant For: Family History  Problem Relation Age of Onset  . Heart Problems Father   . Heart disease Father   . Hypertension Father   . Mental illness Mother   . Arthritis Mother   . Cancer Mother        ovarian  . Stroke Paternal Uncle   . Heart disease Paternal Grandfather   . Cancer Maternal Aunt   . Stroke Maternal Grandmother   . Sudden death Maternal Grandfather   . Heart disease Paternal Grandmother   . Colon cancer Neg Hx   . Esophageal cancer Neg Hx   . Rectal cancer Neg Hx   . Colon polyps Neg Hx   . Stomach cancer Neg Hx     His Social History Is Significant For: Social History   Socioeconomic History  . Marital status: Married    Spouse name: Not on file  . Number of children: 1  . Years of education: Not on file  . Highest education level: Not on file  Occupational History  . Occupation: retired  Scientific laboratory technician  . Financial resource strain: Not hard at all  . Food insecurity    Worry: Never true    Inability: Never true  . Transportation needs    Medical: No     Non-medical: No  Tobacco Use  . Smoking status: Former Research scientist (life sciences)  . Smokeless tobacco: Never Used  Substance and Sexual Activity  . Alcohol use: Yes    Alcohol/week: 2.0 - 3.0 standard drinks    Types: 2 - 3 Glasses of wine per week    Comment: Wine daily with dinner  . Drug use: No  . Sexual activity: Never  Lifestyle  . Physical activity    Days per week: Not on file    Minutes per session: Not on file  . Stress: Not on file  Relationships  . Social connections  Talks on phone: Not on file    Gets together: Not on file    Attends religious service: Not on file    Active member of club or organization: Not on file    Attends meetings of clubs or organizations: Not on file    Relationship status: Not on file  Other Topics Concern  . Not on file  Social History Narrative  . Not on file    His Allergies Are:  Allergies  Allergen Reactions  . Bactrim [Sulfamethoxazole-Trimethoprim] Anaphylaxis    Ulcers  . Crestor [Rosuvastatin Calcium]     Severe Myalgias  :   His Current Medications Are:  Outpatient Encounter Medications as of 12/05/2018  Medication Sig  . amiodarone (PACERONE) 100 MG tablet Take 1 tablet (100 mg total) by mouth daily.  Marland Kitchen aspirin 81 MG tablet Take 81 mg by mouth daily.  . bisoprolol (ZEBETA) 10 MG tablet TAKE 1.5 TABLETS BY MOUTH EVERY DAY  . Coenzyme Q10 (CO Q 10 PO) Take 300 mg by mouth at bedtime.   . DULoxetine (CYMBALTA) 20 MG capsule Take 20 mg by mouth daily.   . Evolocumab (REPATHA SURECLICK) 673 MG/ML SOAJ Inject 140 mg into the skin every 14 (fourteen) days.  . furosemide (LASIX) 20 MG tablet Take 20 mg by mouth. When not taking HCTZ.  . hydrochlorothiazide (HYDRODIURIL) 12.5 MG tablet Take 1 tablet (12.5 mg total) by mouth daily.  Marland Kitchen lisinopril (PRINIVIL,ZESTRIL) 40 MG tablet Take 1 tablet (40 mg total) by mouth daily. For blood pressure.  . nitroGLYCERIN (NITROSTAT) 0.4 MG SL tablet Place 1 tablet (0.4 mg total) every 5 (five) minutes as  needed under the tongue for chest pain.  Marland Kitchen omeprazole (PRILOSEC) 40 MG capsule Take 1 capsule (40 mg total) by mouth 2 (two) times daily before a meal. For heartburn.  . triamcinolone cream (KENALOG) 0.5 % Apply 1 application topically 2 (two) times daily as needed. For rash (Patient taking differently: Apply 1 application topically 2 (two) times daily as needed (rash). )  . vitamin B-12 (CYANOCOBALAMIN) 1000 MCG tablet Take 1,000 mcg by mouth daily.  Marland Kitchen warfarin (COUMADIN) 7.5 MG tablet Take 0.5-1 tablets (3.75-7.5 mg total) by mouth See admin instructions. Take 7.5mg  on sundays, all other days take 3.75mg  or as directed by coumadin clinic.  . [DISCONTINUED] Cholecalciferol (VITAMIN D) 2000 UNITS CAPS Take 2,000 Units by mouth daily.   . [DISCONTINUED] enoxaparin (LOVENOX) 120 MG/0.8ML injection Inject 0.8 mLs (120 mg total) into the skin daily.  . [DISCONTINUED] LORazepam (ATIVAN) 0.5 MG tablet Take 0.5 mg by mouth 2 (two) times daily as needed.  . [DISCONTINUED] pantoprazole (PROTONIX) 40 MG tablet TAKE 1 TABLET (40 MG TOTAL) BY MOUTH DAILY. FOR HEARTBURN. (Patient not taking: Reported on 11/25/2018)   No facility-administered encounter medications on file as of 12/05/2018.   :   Review of Systems:  Out of a complete 14 point review of systems, all are reviewed and negative with the exception of these symptoms as listed below:    Review of Systems  Neurological:       Pt presents today to discuss his left sided weakness.    Objective:  Neurological Exam  Physical Exam Physical Examination:   Vitals:   12/05/18 1304  BP: (!) 161/84  Pulse: (!) 59   General Examination: The patient is a very pleasant 73 y.o. male in no acute distress. He appears well-developed and well-nourished and well groomed.   HEENT: Normocephalic, atraumatic, pupils are equal, round  and reactive to light and accommodation. Extraocular tracking is good, he has no facial weakness, he has no oral facial  dyskinesias, no lip, neck or jaw tremor. Oropharynx examination revealed moderate mouth dryness, tongue protrudes centrally and palate elevates symmetrically.  Chest: Clear to auscultation without wheezing, rhonchi or crackles noted.  Heart: S1+S2+0, regular and normal without murmurs, rubs or gallops noted.   Abdomen: Soft, non-tender and non-distended with normal bowel sounds appreciated on auscultation.  Extremities: There is no pitting edema in the distal lower extremities bilaterally.   Skin: Warm and dry without trophic changes noted. Chronic appearing discoloration and well as spider veins In the distal lower extremities bilaterally.  Musculoskeletal: exam reveals no obvious joint deformities, tenderness or joint swelling or erythema.   Neurologically:  Mental status: The patient is awake, alert and oriented in all 4 spheres. His immediate and remote memory, attention, language skills and fund of knowledge are fairly appropriate. There is no evidence of aphasia, agnosia, apraxia or anomia. Speech is clear with normal prosody and enunciation. Thought process is linear. Mood is normal and affect is normal.  Cranial nerves II - XII are as described above under HEENT exam. In addition: shoulder shrug is normal with equal shoulder height noted. Motor exam: Normal bulk, strength and tone is noted. There is no drift, resting tremor or Postural tremor, no action tremor in the upper extremities.   (On 10/07/2017: on Archimedes spiral drawing he has slight insecurity with the left more than right hand, no significant trembling. Handwriting is legible, not tremulous, not micrographic.)  Romberg is negative, With the exception of mild sway noted, reflexes are 1+ in the upper and lower extremities in the knees, absent in the ankles bilaterally.   Fine motor skills with coordination: preserved in the upper and lower extremities. Slight slowness noticed with hand movements, finger taps and foot  taps on the left, no decrement in amplitude.  Cerebellar testing: No dysmetria or intention tremor on finger to nose testing. Sensory exam: intact to light touch.  Gait, station and balance: He standswithout difficulty, requires no assistance, he walks with a a slight limp on the left. He Brought his cane today but was able to walk without it back and forth in the room, walks somewhat slowly, no obvious foot drop noted.  Assessment and Plan:    In summary, [name] is a very pleasant 73 year old male with an underlying medical history of hypertension, hyperlipidemia, reflux disease, sleep apnea, depression, coronary artery disease, BPH, A. fib, prior history of stroke, And prior smoking, who presents for left-sided weakness.  He has a prior history of stroke and residual left-sided weakness but had worsening suddenly of his left-sided weakness in February which prompted an ER visit.  An MRI of the brain showed no acute changes.  He does have several stroke risk factors and is advised that secondary prevention is key.  He reports some residual difficulty with fine motor control and walking affecting his left side, residual from his stroke from 2006. We talked about the importance of secondary stroke prevention and maintaining a healthy lifestyle in general. He is followed by pulmonology for his sleep disorder, he is followed by cardiology as well.  He is advised to follow-up with his primary care physician routinely, we will check a carotid Doppler ultrasound as he has not had an imaging test of his carotids.  We will call him with his test results.  He is advised to stay well-hydrated with water, 6  to 8 cups/day.  He is furthermore advised to reduce and consider eliminating daily alcohol consumption.  So long as the carotid Doppler study is nonrevealing and benign, he can follow-up with me on an as-needed basis.  I answered all the questions today and the patient and his wife were in agreement.

## 2018-12-07 NOTE — Addendum Note (Signed)
Addended by: Lester Kremmling A on: 12/07/2018 10:59 AM   Modules accepted: Orders

## 2018-12-13 ENCOUNTER — Encounter (HOSPITAL_COMMUNITY): Payer: PPO

## 2018-12-15 ENCOUNTER — Ambulatory Visit (HOSPITAL_COMMUNITY)
Admission: RE | Admit: 2018-12-15 | Discharge: 2018-12-15 | Disposition: A | Discharge status: Home / Self Care | Payer: PPO | Source: Ambulatory Visit | Attending: Neurology | Admitting: Neurology

## 2018-12-15 ENCOUNTER — Other Ambulatory Visit: Payer: Self-pay

## 2018-12-15 DIAGNOSIS — Z8673 Personal history of transient ischemic attack (TIA), and cerebral infarction without residual deficits: Secondary | ICD-10-CM | POA: Diagnosis not present

## 2018-12-15 DIAGNOSIS — R531 Weakness: Secondary | ICD-10-CM | POA: Diagnosis not present

## 2018-12-15 NOTE — Progress Notes (Signed)
Please advise patient that the recent carotid Doppler study which is the ultrasound of the main neck arteries, was negative for any significant stenosis, that is tightness of the arteries. No further action is required at this time of this test. Star Age, MD, PhD [facility]

## 2018-12-19 ENCOUNTER — Telehealth: Payer: Self-pay

## 2018-12-19 NOTE — Telephone Encounter (Signed)
I called pt, left a detailed message on home number, per DPR, will these test results. No answer, left a message asking him to call us back with questions or concerns.

## 2018-12-19 NOTE — Telephone Encounter (Signed)
-----   Message from Star Age, MD sent at 12/15/2018  5:16 PM EDT ----- Please advise patient that the recent carotid Doppler study which is the ultrasound of the main neck arteries, was negative for any significant stenosis, that is tightness of the arteries. No further action is required at this time of this test. Star Age, MD, PhD [facility] ([place]

## 2018-12-21 ENCOUNTER — Ambulatory Visit (INDEPENDENT_AMBULATORY_CARE_PROVIDER_SITE_OTHER)
Admission: RE | Admit: 2018-12-21 | Discharge: 2018-12-21 | Disposition: A | Discharge status: Home / Self Care | Payer: PPO | Source: Ambulatory Visit | Attending: Primary Care | Admitting: Primary Care

## 2018-12-21 ENCOUNTER — Ambulatory Visit (INDEPENDENT_AMBULATORY_CARE_PROVIDER_SITE_OTHER): Payer: PPO | Admitting: Primary Care

## 2018-12-21 ENCOUNTER — Other Ambulatory Visit: Payer: Self-pay

## 2018-12-21 ENCOUNTER — Encounter: Payer: Self-pay | Admitting: Primary Care

## 2018-12-21 VITALS — BP 160/82 | HR 57 | Temp 98.4°F | Ht 73.0 in | Wt 186.5 lb

## 2018-12-21 DIAGNOSIS — M25521 Pain in right elbow: Secondary | ICD-10-CM

## 2018-12-21 DIAGNOSIS — I10 Essential (primary) hypertension: Secondary | ICD-10-CM | POA: Diagnosis not present

## 2018-12-21 LAB — CBC WITH DIFFERENTIAL/PLATELET
Basophils Absolute: 0 10*3/uL (ref 0.0–0.1)
Basophils Relative: 0.6 % (ref 0.0–3.0)
Eosinophils Absolute: 0.1 10*3/uL (ref 0.0–0.7)
Eosinophils Relative: 1.9 % (ref 0.0–5.0)
HCT: 42.1 % (ref 39.0–52.0)
Hemoglobin: 14.1 g/dL (ref 13.0–17.0)
Lymphocytes Relative: 27.5 % (ref 12.0–46.0)
Lymphs Abs: 1.5 10*3/uL (ref 0.7–4.0)
MCHC: 33.5 g/dL (ref 30.0–36.0)
MCV: 96.2 fl (ref 78.0–100.0)
Monocytes Absolute: 0.7 10*3/uL (ref 0.1–1.0)
Monocytes Relative: 12 % (ref 3.0–12.0)
Neutro Abs: 3.2 10*3/uL (ref 1.4–7.7)
Neutrophils Relative %: 58 % (ref 43.0–77.0)
Platelets: 203 10*3/uL (ref 150.0–400.0)
RBC: 4.38 Mil/uL (ref 4.22–5.81)
RDW: 13.4 % (ref 11.5–15.5)
WBC: 5.6 10*3/uL (ref 4.0–10.5)

## 2018-12-21 LAB — URIC ACID: Uric Acid, Serum: 5.7 mg/dL (ref 4.0–7.8)

## 2018-12-21 MED ORDER — PREDNISONE 20 MG PO TABS: ORAL_TABLET | ORAL | 0 refills | Status: DC

## 2018-12-21 NOTE — Assessment & Plan Note (Signed)
Acute for the last 2 to 3 weeks. No obvious cellulitis, does have noticeable swelling with tenderness.  Need to rule out septic joint, also consider gout. Other differentials include bursitis, osteoarthritis.  Labs for CBC and uric acid pending. Plain films of the elbow pending.  Treat with prednisone taper short-term given radiculopathy.  Await results.

## 2018-12-21 NOTE — Patient Instructions (Addendum)
Complete xray(s) and labs prior to leaving today. I will notify you of your results once received.  Start prednisone for inflammation and discomfort. Take 2 tablets daily for three days, then 1 tablet daily for three days.  I will be in touch with your cardiologist regarding a plan for your blood pressure.  It was a pleasure to see you today!

## 2018-12-21 NOTE — Progress Notes (Signed)
Subjective:    Patient ID: [name], male    DOB: [birth year], 73 y.o.   MRN: [id number]  HPI  [name] is a 73 year old male with a history of atrial fibrillation, CAD, hypertension, sleep apnea, COPD, who presents today with multiple complaints.  1) Elbow Pain: Located to the lateral epicondyle side which began about 3-4 weeks ago. He describes his pain as throbbing and aching. His pain is constant but is worse with movement and lifting.  For example, he will have pain when lifting his coffee cup.  His pain will radiate up to the shoulder and down to his hand, some weakness to the elbow with lifting due to pain. Denies numbness/tingling. He's taken Tylenol without improvement. He denies injury/trauma, overuse, wounds, warmth, redness.   2) Hypertension: Currently managed on bisoprolol 15 mg, furosemide 20 and HCTZ 12.5 mg on alternate days,  lisinopril 40 mg. He is following with cardiology with his last visit being November 17, 2018. BP was running higher at that time, no changes made as he hadn't had BP medications that morning.   He's been checking his BP at home and is getting readings of 150's/80's, some 170's/90's.  He denies dizziness, chest pain, shortness of breath.  BP Readings from Last 3 Encounters:  12/21/18 (!) 160/82  12/05/18 (!) 161/84  11/25/18 127/79     Review of Systems  Respiratory: Negative for shortness of breath.   Cardiovascular: Negative for chest pain.  Musculoskeletal: Positive for arthralgias.       Acute right elbow pain  Skin: Negative for color change.  Neurological: Negative for dizziness.       Past Medical History:  Diagnosis Date  . BPH (benign prostatic hyperplasia)   . Coronary artery disease    a. s/p PCI to LCx in 2007; b. MV 2017 no sig ischemia, EF 59%, nl study  . Depression   . GERD (gastroesophageal reflux disease)   . Hyperlipidemia   . Hypertension   . IBS (irritable bowel syndrome)   . Myocardial infarction ([place])     12-13 yrs ago   . PAF (paroxysmal atrial fibrillation) ([place])    a. CHADS2VASc => 5 (HTN, age x 1, stroke x 2, vascular disease); b. on Coumadin   . PNA (pneumonia)   . Skin cancer    face   . Sleep apnea    off cpap   . Stroke [facility])    a. x 2 with residual left-sided weakness     Social History   Socioeconomic History  . Marital status: Married    Spouse name: Not on file  . Number of children: 1  . Years of education: Not on file  . Highest education level: Not on file  Occupational History  . Occupation: retired  Scientific laboratory technician  . Financial resource strain: Not hard at all  . Food insecurity    Worry: Never true    Inability: Never true  . Transportation needs    Medical: No    Non-medical: No  Tobacco Use  . Smoking status: Former Research scientist (life sciences)  . Smokeless tobacco: Never Used  Substance and Sexual Activity  . Alcohol use: Yes    Alcohol/week: 2.0 - 3.0 standard drinks    Types: 2 - 3 Glasses of wine per week    Comment: Wine daily with dinner  . Drug use: No  . Sexual activity: Never  Lifestyle  . Physical activity    Days per week: Not on file  Minutes per session: Not on file  . Stress: Not on file  Relationships  . Social Herbalist on phone: Not on file    Gets together: Not on file    Attends religious service: Not on file    Active member of club or organization: Not on file    Attends meetings of clubs or organizations: Not on file    Relationship status: Not on file  . Intimate partner violence    Fear of current or ex partner: No    Emotionally abused: No    Physically abused: No    Forced sexual activity: No  Other Topics Concern  . Not on file  Social History Narrative  . Not on file    Past Surgical History:  Procedure Laterality Date  . ADENOIDECTOMY  1951  . CARDIAC CATHETERIZATION  2006   X1 STENT  . COLONOSCOPY    . POLYPECTOMY    . SKIN CANCER EXCISION N/A 08/2016   Forehead.  [facility] (Dr. Deliah Boston)   . THYROIDECTOMY, PARTIAL    . UPPER GASTROINTESTINAL ENDOSCOPY      Family History  Problem Relation Age of Onset  . Heart Problems Father   . Heart disease Father   . Hypertension Father   . Mental illness Mother   . Arthritis Mother   . Cancer Mother        ovarian  . Stroke Paternal Uncle   . Heart disease Paternal Grandfather   . Cancer Maternal Aunt   . Stroke Maternal Grandmother   . Sudden death Maternal Grandfather   . Heart disease Paternal Grandmother   . Colon cancer Neg Hx   . Esophageal cancer Neg Hx   . Rectal cancer Neg Hx   . Colon polyps Neg Hx   . Stomach cancer Neg Hx     Allergies  Allergen Reactions  . Bactrim [Sulfamethoxazole-Trimethoprim] Anaphylaxis    Ulcers  . Crestor [Rosuvastatin Calcium]     Severe Myalgias    Current Outpatient Medications on File Prior to Visit  Medication Sig Dispense Refill  . amiodarone (PACERONE) 100 MG tablet Take 1 tablet (100 mg total) by mouth daily. 90 tablet 3  . aspirin 81 MG tablet Take 81 mg by mouth daily.    . bisoprolol (ZEBETA) 10 MG tablet TAKE 1.5 TABLETS BY MOUTH EVERY DAY 135 tablet 0  . Coenzyme Q10 (CO Q 10 PO) Take 300 mg by mouth at bedtime.     . DULoxetine (CYMBALTA) 20 MG capsule Take 20 mg by mouth daily.     . Evolocumab (REPATHA SURECLICK) 297 MG/ML SOAJ Inject 140 mg into the skin every 14 (fourteen) days. 2 pen 11  . furosemide (LASIX) 20 MG tablet Take 20 mg by mouth. When not taking HCTZ.    . hydrochlorothiazide (HYDRODIURIL) 12.5 MG tablet Take 1 tablet (12.5 mg total) by mouth daily. 90 tablet 0  . lisinopril (PRINIVIL,ZESTRIL) 40 MG tablet Take 1 tablet (40 mg total) by mouth daily. For blood pressure. 90 tablet 3  . nitroGLYCERIN (NITROSTAT) 0.4 MG SL tablet Place 1 tablet (0.4 mg total) every 5 (five) minutes as needed under the tongue for chest pain. 25 tablet 3  . omeprazole (PRILOSEC) 40 MG capsule Take 1 capsule (40 mg total) by mouth 2 (two) times daily before a meal. For  heartburn. 180 capsule 1  . triamcinolone cream (KENALOG) 0.5 % Apply 1 application topically 2 (two) times daily as needed.  For rash (Patient taking differently: Apply 1 application topically 2 (two) times daily as needed (rash). ) 30 g 0  . vitamin B-12 (CYANOCOBALAMIN) 1000 MCG tablet Take 1,000 mcg by mouth daily.    Marland Kitchen warfarin (COUMADIN) 7.5 MG tablet Take 0.5-1 tablets (3.75-7.5 mg total) by mouth See admin instructions. Take 7.5mg  on sundays, all other days take 3.75mg  or as directed by coumadin clinic. 108 tablet 1   No current facility-administered medications on file prior to visit.     BP (!) 160/82   Pulse (!) 57   Temp 98.4 F (36.9 C) (Temporal)   Ht 6\' 1"  (1.854 m)   Wt 186 lb 8 oz (84.6 kg)   SpO2 98%   BMI 24.61 kg/m    Objective:   Physical Exam  Constitutional: He is oriented to person, place, and time. He appears well-nourished.  Respiratory: Effort normal.  Musculoskeletal:     Comments: 4-5 strength right upper extremity with compared to left.  Mild swelling to right lateral upper condyle.  No erythema.  Tenderness upon palpation.  Neurological: He is alert and oriented to person, place, and time.  Skin: Skin is warm and dry. No erythema.           Assessment & Plan:

## 2018-12-21 NOTE — Assessment & Plan Note (Signed)
Above goal in the office today, also with home readings. Already maxed out on ACE and on several other medications.  We will need to consult with cardiology given renal function and cardiac history.  His kidneys may not be able to handle an increase in HCTZ and we have to be careful with furosemide in regards to dehydration.  Consider adding calcium channel blocker.  Will consult.

## 2018-12-22 ENCOUNTER — Encounter: Payer: Self-pay | Admitting: Gastroenterology

## 2018-12-22 ENCOUNTER — Ambulatory Visit (INDEPENDENT_AMBULATORY_CARE_PROVIDER_SITE_OTHER): Payer: PPO | Admitting: Gastroenterology

## 2018-12-22 VITALS — Ht 73.0 in | Wt 185.0 lb

## 2018-12-22 DIAGNOSIS — K219 Gastro-esophageal reflux disease without esophagitis: Secondary | ICD-10-CM

## 2018-12-22 DIAGNOSIS — R112 Nausea with vomiting, unspecified: Secondary | ICD-10-CM

## 2018-12-22 NOTE — Progress Notes (Signed)
Did not answer phone or texts for our virtual appointment today.

## 2018-12-28 DIAGNOSIS — I10 Essential (primary) hypertension: Secondary | ICD-10-CM

## 2018-12-28 MED ORDER — AMLODIPINE BESYLATE 5 MG PO TABS: 5.0000 mg | ORAL_TABLET | Freq: Every day | ORAL | 0 refills | Status: DC

## 2018-12-29 ENCOUNTER — Ambulatory Visit: Payer: PPO

## 2019-01-09 ENCOUNTER — Encounter: Payer: Self-pay | Admitting: Gastroenterology

## 2019-01-09 ENCOUNTER — Ambulatory Visit (INDEPENDENT_AMBULATORY_CARE_PROVIDER_SITE_OTHER): Payer: PPO | Admitting: Gastroenterology

## 2019-01-09 VITALS — BP 118/70 | HR 64 | Temp 98.2°F | Ht 71.0 in | Wt 184.5 lb

## 2019-01-09 DIAGNOSIS — K5900 Constipation, unspecified: Secondary | ICD-10-CM

## 2019-01-09 DIAGNOSIS — K219 Gastro-esophageal reflux disease without esophagitis: Secondary | ICD-10-CM

## 2019-01-09 MED ORDER — OMEPRAZOLE 40 MG PO CPDR: 40.0000 mg | DELAYED_RELEASE_CAPSULE | Freq: Every day | ORAL | 1 refills | Status: DC

## 2019-01-09 NOTE — Patient Instructions (Addendum)
Continue omeprazole 40 mg daily.  Resume daily Metamucil given your constipation.  Thank you for putting your trust in me. Please call me if you have any problems!

## 2019-01-09 NOTE — Progress Notes (Signed)
Referring Provider: Pleas Koch, NP Primary Care Physician:  Pleas Koch, NP  Reason for Consultation:  GERD   IMPRESSION:  Reflux improving on PPI therapy    -Biopsy-proven reflux 11/25/2018    -Biopsies negative for eosinophilic esophagitis H. pylori negative gastritis on upper endoscopy 11/25/2018 Nausea Bloating Hiatal hernia Fatty liver on ultrasound On coumadin for atrial fibrillation (Dr. Rockey Situ) History of colon polyps    - tubular adenoma on 2014 [facility]), surveillance recommended in 5 years     - 6 mm sessile serrated ascending colon adenoma 11/25/2018    - consider surveillance colonoscopy in 7 years if clinically appropriate at that time  Clinically improving on daily PPI. He asked about the risks of long-term PPI therapy. We discussed: the need for calcium with vit. D supplements because of possible interference with absorption; potential increased risk of bone fractures, GI infections, cardiac related disease, interactions with clopidogrel (Plavix), kidney disease; c. diff infection; potential of vitamin D, B12 and magnesium malabsorption that requires chronic monitoring; potential for causing osteoporosis and the need for monitoring. Long-term monitoring may be performed through the patient's PCP if preferred. I discussed a recent study that showed a possible association between chronic PPI use and increase risk of dementia and overall mortality.  The validity of these adverse events is uncertain.  We discussed the potential risks of use of these medicines with the benefit of their use. We discussed the option of discontinuation.  There obviously is a risk in both taking the medicines as well as stopping the medicines in certain disease processes. All questions were answered to the best of my ability.  PLAN: Continue omeprazole 40 mg daily Resume daily metamucil Consider surveillance colonoscopy in 7 years if clinically appropriate at that time Return in  follow-up as needed. No formal follow-up scheduled.      HPI: [name] is a 73 y.o. male under evaluation of GERD. Virtual consult performed 10/12/18. He had an abd ultrasound 11/10/18. Endo performed 11/25/18. Returns in scheduled follow-up. Abd ultr 11/10/18: mild fatty infiltration of the liver, no acute findings. EGD 10/12/18: biopsy-confirmed reflux esophagitis with a slightly irregular z-line, gastritis without H pylori, and a small hiatal hernia.  Biopsies were negative for eosinophilic esophagitis  and Barrett's esophagus..  Colonoscopy showed hemorrhoids, a 30mm sessile serrated adenoma, pan colonic diverticulosis.   Symptoms have largely resolved on daily omeprazole 40 mg daily. GERD is largely resolved. No further bloating or nausea. Bowel habits are normal.  Weight remains stable.   Feeling more constipated than normal. Now with BM every other day. Associated straining. Stools are now harder. He thinks he should start Metamucil.   Drinks 1-2 glasses of wine with dinner.    He had an EGD and colonoscopy 2014 [facility] in [place], [place]. He had a 30mm polyp in the ascending colon removed and surveillance in 5-10 years.    Labs 08/02/18: Hgb 15.3, MCV 92.6, RDW 13.2  Past Medical History:  Diagnosis Date  . BPH (benign prostatic hyperplasia)   . Coronary artery disease    a. s/p PCI to LCx in 2007; b. MV 2017 no sig ischemia, EF 59%, nl study  . Depression   . GERD (gastroesophageal reflux disease)   . Hyperlipidemia   . Hypertension   . IBS (irritable bowel syndrome)   . Myocardial infarction ([place])    12-13 yrs ago   . PAF (paroxysmal atrial fibrillation) ([place])    a. CHADS2VASc => 5 (HTN, age  x 1, stroke x 2, vascular disease); b. on Coumadin   . PNA (pneumonia)   . Skin cancer    face   . Sleep apnea    off cpap   . Stroke [facility])    a. x 2 with residual left-sided weakness    Past Surgical History:  Procedure Laterality Date  . ADENOIDECTOMY  1951  . CARDIAC  CATHETERIZATION  2006   X1 STENT  . COLONOSCOPY    . POLYPECTOMY    . SKIN CANCER EXCISION N/A 08/2016   Forehead.  [facility] (Dr. Deliah Boston)  . THYROIDECTOMY, PARTIAL    . UPPER GASTROINTESTINAL ENDOSCOPY      Current Outpatient Medications  Medication Sig Dispense Refill  . amiodarone (PACERONE) 100 MG tablet Take 1 tablet (100 mg total) by mouth daily. 90 tablet 3  . amLODipine (NORVASC) 5 MG tablet Take 1 tablet (5 mg total) by mouth daily. For blood pressure. 30 tablet 0  . aspirin 81 MG tablet Take 81 mg by mouth daily.    . bisoprolol (ZEBETA) 10 MG tablet TAKE 1.5 TABLETS BY MOUTH EVERY DAY 135 tablet 0  . Coenzyme Q10 (CO Q 10 PO) Take 300 mg by mouth at bedtime.     . DULoxetine (CYMBALTA) 20 MG capsule Take 20 mg by mouth daily.     . Evolocumab (REPATHA SURECLICK) 742 MG/ML SOAJ Inject 140 mg into the skin every 14 (fourteen) days. 2 pen 11  . furosemide (LASIX) 20 MG tablet Take 20 mg by mouth. When not taking HCTZ.    . hydrochlorothiazide (HYDRODIURIL) 12.5 MG tablet Take 1 tablet (12.5 mg total) by mouth daily. 90 tablet 0  . lisinopril (PRINIVIL,ZESTRIL) 40 MG tablet Take 1 tablet (40 mg total) by mouth daily. For blood pressure. 90 tablet 3  . nitroGLYCERIN (NITROSTAT) 0.4 MG SL tablet Place 1 tablet (0.4 mg total) every 5 (five) minutes as needed under the tongue for chest pain. 25 tablet 3  . omeprazole (PRILOSEC) 40 MG capsule Take 1 capsule (40 mg total) by mouth 2 (two) times daily before a meal. For heartburn. 180 capsule 1  . predniSONE (DELTASONE) 20 MG tablet Take 2 tablets daily for 3 days then 1 tablet daily 3 days. 9 tablet 0  . triamcinolone cream (KENALOG) 0.5 % Apply 1 application topically 2 (two) times daily as needed. For rash (Patient taking differently: Apply 1 application topically 2 (two) times daily as needed (rash). ) 30 g 0  . vitamin B-12 (CYANOCOBALAMIN) 1000 MCG tablet Take 1,000 mcg by mouth daily.    Marland Kitchen warfarin (COUMADIN) 7.5  MG tablet Take 0.5-1 tablets (3.75-7.5 mg total) by mouth See admin instructions. Take 7.5mg  on sundays, all other days take 3.75mg  or as directed by coumadin clinic. 108 tablet 1   No current facility-administered medications for this visit.     Allergies as of 01/09/2019 - Review Complete 12/22/2018  Allergen Reaction Noted  . Bactrim [sulfamethoxazole-trimethoprim] Anaphylaxis 01/23/2015  . Crestor [rosuvastatin calcium]  02/11/2016    Family History  Problem Relation Age of Onset  . Heart Problems Father   . Heart disease Father   . Hypertension Father   . Mental illness Mother   . Arthritis Mother   . Cancer Mother        ovarian  . Stroke Paternal Uncle   . Heart disease Paternal Grandfather   . Cancer Maternal Aunt   . Stroke Maternal Grandmother   . Sudden death Maternal  Grandfather   . Heart disease Paternal Grandmother   . Colon cancer Neg Hx   . Esophageal cancer Neg Hx   . Rectal cancer Neg Hx   . Colon polyps Neg Hx   . Stomach cancer Neg Hx     Social History   Socioeconomic History  . Marital status: Married    Spouse name: Not on file  . Number of children: 1  . Years of education: Not on file  . Highest education level: Not on file  Occupational History  . Occupation: retired  Scientific laboratory technician  . Financial resource strain: Not hard at all  . Food insecurity    Worry: Never true    Inability: Never true  . Transportation needs    Medical: No    Non-medical: No  Tobacco Use  . Smoking status: Former Research scientist (life sciences)  . Smokeless tobacco: Never Used  Substance and Sexual Activity  . Alcohol use: Yes    Alcohol/week: 2.0 - 3.0 standard drinks    Types: 2 - 3 Glasses of wine per week    Comment: Wine daily with dinner  . Drug use: No  . Sexual activity: Never  Lifestyle  . Physical activity    Days per week: Not on file    Minutes per session: Not on file  . Stress: Not on file  Relationships  . Social Herbalist on phone: Not on file     Gets together: Not on file    Attends religious service: Not on file    Active member of club or organization: Not on file    Attends meetings of clubs or organizations: Not on file    Relationship status: Not on file  . Intimate partner violence    Fear of current or ex partner: No    Emotionally abused: No    Physically abused: No    Forced sexual activity: No  Other Topics Concern  . Not on file  Social History Narrative  . Not on file    Physical Exam: General: in no acute distress Neuro: Alert and appropriate Psych: Normal affect and normal insight   Ariell Gunnels L. Tarri Glenn, MD, MPH [facility] 01/09/2019, 3:40 PM

## 2019-01-10 ENCOUNTER — Other Ambulatory Visit: Payer: Self-pay | Admitting: *Deleted

## 2019-01-10 MED ORDER — BISOPROLOL FUMARATE 10 MG PO TABS: ORAL_TABLET | ORAL | 1 refills | Status: DC

## 2019-01-12 ENCOUNTER — Ambulatory Visit (INDEPENDENT_AMBULATORY_CARE_PROVIDER_SITE_OTHER): Payer: PPO | Admitting: General Practice

## 2019-01-12 ENCOUNTER — Ambulatory Visit: Payer: PPO

## 2019-01-12 DIAGNOSIS — Z8673 Personal history of transient ischemic attack (TIA), and cerebral infarction without residual deficits: Secondary | ICD-10-CM

## 2019-01-12 DIAGNOSIS — Z7901 Long term (current) use of anticoagulants: Secondary | ICD-10-CM | POA: Diagnosis not present

## 2019-01-12 DIAGNOSIS — I4891 Unspecified atrial fibrillation: Secondary | ICD-10-CM

## 2019-01-12 LAB — POCT INR: INR: 2.2 (ref 2.0–3.0)

## 2019-01-12 NOTE — Patient Instructions (Addendum)
Pre visit review using our clinic review tool, if applicable. No additional management support is needed unless otherwise documented below in the visit note.  Continue to take 1/2 tablet (3.75mg ) daily EXCEPT for 1 tablet on Sundays.  Re-check in 6 weeks.

## 2019-01-19 ENCOUNTER — Other Ambulatory Visit: Payer: Self-pay | Admitting: Primary Care

## 2019-01-19 DIAGNOSIS — I10 Essential (primary) hypertension: Secondary | ICD-10-CM

## 2019-01-19 DIAGNOSIS — L309 Dermatitis, unspecified: Secondary | ICD-10-CM

## 2019-01-19 MED ORDER — TRIAMCINOLONE ACETONIDE 0.1 % EX CREA: 1.0000 "application " | TOPICAL_CREAM | Freq: Two times a day (BID) | CUTANEOUS | 0 refills | Status: DC

## 2019-01-19 NOTE — Telephone Encounter (Signed)
Ok to refill 

## 2019-02-01 ENCOUNTER — Telehealth: Payer: Self-pay

## 2019-02-01 NOTE — Telephone Encounter (Signed)
Call to patient based on sx r/t patient message received.   No answer/NV.

## 2019-02-02 NOTE — Telephone Encounter (Signed)
"  Irregular rhythm coul/d be atrial fib, we have seen it before. Previous monitor you were 6% of the time in atrial fib.  Sounds like you were sick, virus maybe? Perhaps if symptoms persist, would call PMD to get tested.  BP looks good Omeprazole over the counter for acid reflux, one a day Would talk with coumadin nurse about multivitamin with and without K They know the details more than me Cut off the drinks after 7pm to curb the urination Leg pain, I am unclear. Maybe talk with Alma Friendly. We should check labs (a BMP) Thx TGollan  Last read by [name] at 9:57 PM on 02/01/2019."  Pt contacted directly from Dr. Rockey Situ, pt read.   Encounter completed at this time.

## 2019-02-11 ENCOUNTER — Other Ambulatory Visit: Payer: Self-pay | Admitting: Primary Care

## 2019-02-11 DIAGNOSIS — I10 Essential (primary) hypertension: Secondary | ICD-10-CM

## 2019-02-11 NOTE — Telephone Encounter (Signed)
Sent mychart message to patient to see how he is doing on Amlodipine and get b/p readings.

## 2019-02-16 DIAGNOSIS — F330 Major depressive disorder, recurrent, mild: Secondary | ICD-10-CM | POA: Diagnosis not present

## 2019-02-16 DIAGNOSIS — F411 Generalized anxiety disorder: Secondary | ICD-10-CM | POA: Diagnosis not present

## 2019-02-23 ENCOUNTER — Telehealth: Payer: Self-pay | Admitting: Cardiovascular Disease

## 2019-02-23 ENCOUNTER — Ambulatory Visit (INDEPENDENT_AMBULATORY_CARE_PROVIDER_SITE_OTHER): Payer: PPO | Admitting: General Practice

## 2019-02-23 DIAGNOSIS — Z7901 Long term (current) use of anticoagulants: Secondary | ICD-10-CM | POA: Diagnosis not present

## 2019-02-23 DIAGNOSIS — Z8673 Personal history of transient ischemic attack (TIA), and cerebral infarction without residual deficits: Secondary | ICD-10-CM

## 2019-02-23 DIAGNOSIS — I4891 Unspecified atrial fibrillation: Secondary | ICD-10-CM

## 2019-02-23 LAB — POCT INR: INR: 1.9 — AB (ref 2.0–3.0)

## 2019-02-23 NOTE — Telephone Encounter (Signed)
   Primary Cardiologist: Ida Rogue, MD  Chart reviewed as part of pre-operative protocol coverage. Simple dental extractions are considered low risk procedures per guidelines and generally do not require any specific cardiac clearance. It is also generally accepted that for simple extractions and dental cleanings, there is no need to interrupt blood thinner therapy. This pt has a history of afib and stroke. Anticoagulation is not to be interrupted without lovenox bridging.   I do not see a contraindication to pain medication from a cardiac standpoint, however, prior office notes indicate fairly heavy daily alcohol use which would not be good with narcotics.   I will route this recommendation to the requesting party via Epic fax function and remove from pre-op pool.  Please call with questions.  Daune Perch, NP 02/23/2019, 2:09 PM

## 2019-02-23 NOTE — Patient Instructions (Addendum)
Pre visit review using our clinic review tool, if applicable. No additional management support is needed unless otherwise documented below in the visit note.  Take 1 tablet today (9/17) and then continue to take 1/2 tablet (3.75mg ) daily EXCEPT for 1 tablet on Sundays.  Re-check in 1 week due to pt starting a multivitamin.

## 2019-02-23 NOTE — Telephone Encounter (Signed)
   [facility] Pre-operative Risk Assessment    Request for surgical clearance:  1. What type of surgery is being performed? EXTRACTION, ALSO NEEDS CLEARANCE FOR NORCO/HYDROCODONE  2. When is this surgery scheduled? TBD  3. What type of clearance is required (medical clearance vs. Pharmacy clearance to hold med vs. Both)? MEDICAL  4. Are there any medications that need to be held prior to surgery and how long? NOT LISTED  5. Practice name and name of physician performing surgery? [facility]/DR [place] THOMAS   6. What is your office phone number [phone]   7.   What is your office fax number [phone]  8.   Anesthesia type (None, local, MAC, general) ? LOCAL   Lucienne Minks 02/23/2019, 12:07 PM  _________________________________________________________________   (provider comments below)

## 2019-02-27 DIAGNOSIS — G4733 Obstructive sleep apnea (adult) (pediatric): Secondary | ICD-10-CM | POA: Diagnosis not present

## 2019-03-01 ENCOUNTER — Telehealth: Payer: Self-pay | Admitting: Pulmonary Disease

## 2019-03-01 NOTE — Telephone Encounter (Signed)
Dr. Redmond Baseman from [facility] is calling to speak with AO about pt.   AO, please advise. Callback number is [phone]. Thank you.

## 2019-03-02 ENCOUNTER — Telehealth: Payer: Self-pay | Admitting: Pulmonary Disease

## 2019-03-02 ENCOUNTER — Ambulatory Visit (INDEPENDENT_AMBULATORY_CARE_PROVIDER_SITE_OTHER): Payer: PPO | Admitting: General Practice

## 2019-03-02 DIAGNOSIS — Z7901 Long term (current) use of anticoagulants: Secondary | ICD-10-CM | POA: Diagnosis not present

## 2019-03-02 DIAGNOSIS — I4891 Unspecified atrial fibrillation: Secondary | ICD-10-CM

## 2019-03-02 DIAGNOSIS — Z8673 Personal history of transient ischemic attack (TIA), and cerebral infarction without residual deficits: Secondary | ICD-10-CM

## 2019-03-02 LAB — POCT INR: INR: 1.6 — AB (ref 2.0–3.0)

## 2019-03-02 NOTE — Telephone Encounter (Signed)
Called to speak with Dr. Redmond Baseman this morning  [phone]  Was on the line for about 10 minutes with no response, automated service  Can we try and reach back to his office  He can call me on my cell phone [phone]

## 2019-03-02 NOTE — Telephone Encounter (Signed)
Called office number provided by Dr. Ander Slade, gave the office his cell phone number to have Dr. Redmond Baseman call directly to Dr. Ander Slade.   Nothing further needed at this time.

## 2019-03-02 NOTE — Telephone Encounter (Signed)
Please call with status of clearance 

## 2019-03-02 NOTE — Telephone Encounter (Signed)
Called spoke with patient,  Informed him of Dr. Judson Roch recommendations Patient has decided to not move forward at this time. He states he will call us , if he needs [place].   Nothing further at this time   Will route to AO as FYI

## 2019-03-02 NOTE — Telephone Encounter (Signed)
Re-faxed clearance dated 02/23/19.

## 2019-03-02 NOTE — Telephone Encounter (Signed)
Information noted.

## 2019-03-02 NOTE — Patient Instructions (Addendum)
Pre visit review using our clinic review tool, if applicable. No additional management support is needed unless otherwise documented below in the visit note.  Take 1 tablet today and tomorrow (9/24 and 9/25) and then change dosage and take 1/2 tablet (3.75mg ) daily EXCEPT for 1 tablet on Sundays Tuesdays and Thursdays.   Re-check in 2 weeks. Due to pt starting a multivitamin.

## 2019-03-02 NOTE — Telephone Encounter (Signed)
Kindly inform patient  I did speak with Dr. Redmond Baseman who he had recently seen Concern with his previous study was that it was mostly central sleep apnea for which inspire device is not the primary treatment  In order to move forward- We agreed on repeating his sleep study  This will be best achieved by an in lab study-this will help clarify clearly with lites mostly central sleep apnea- if most of the apneas are centrals he will not be a candidate for inspire  If obstructive, then he may be a candidate  We also have to consider whether the issue is whether BiPAP did not help his symptoms or he is not able to tolerate using the BiPAP  I will be glad to follow-up with him in the office as soon as possible  Schedule for in lab polysomnogram Scheduled for follow-up appointment with me Sleep study result should be made available to Dr. Redmond Baseman as well

## 2019-03-07 ENCOUNTER — Telehealth: Payer: Self-pay | Admitting: *Deleted

## 2019-03-07 NOTE — Telephone Encounter (Signed)
To Dr. Rockey Situ to review for refill. . It looks like the patient changed his lasix & hctz dosing on his own. See 02/01/19 patient advise request.

## 2019-03-07 NOTE — Telephone Encounter (Signed)
Pt is taking Furosemide 20 mg 1 tablet by mouth on Monday, Wednesday and Friday. Pt is also alternating with HCTZ 12.5 on Tuesday, Thursday, Sat. And Sunday.  Please advise if ok to refill Furosemide Historical provider.

## 2019-03-08 MED ORDER — FUROSEMIDE 20 MG PO TABS: 20.0000 mg | ORAL_TABLET | ORAL | 0 refills | Status: DC

## 2019-03-08 NOTE — Telephone Encounter (Signed)
Requested Prescriptions   Signed Prescriptions Disp Refills  . furosemide (LASIX) 20 MG tablet 30 tablet 0    Sig: Take 1 tablet (20 mg total) by mouth as directed. Take on Monday, Wednesday and Friday weekly (Days when not taking HCTZ).    Authorizing Provider: Minna Merritts    Ordering User: Britt Bottom

## 2019-03-08 NOTE — Telephone Encounter (Signed)
We can write it as he is taking it  thank you

## 2019-03-09 MED ORDER — FUROSEMIDE 20 MG PO TABS: 20.0000 mg | ORAL_TABLET | ORAL | 6 refills | Status: DC

## 2019-03-09 NOTE — Addendum Note (Signed)
Addended by: Verlon Au on: 03/09/2019 12:06 PM   Modules accepted: Orders

## 2019-03-10 ENCOUNTER — Other Ambulatory Visit: Payer: Self-pay | Admitting: Primary Care

## 2019-03-10 DIAGNOSIS — I10 Essential (primary) hypertension: Secondary | ICD-10-CM

## 2019-03-16 ENCOUNTER — Other Ambulatory Visit: Payer: Self-pay

## 2019-03-16 ENCOUNTER — Ambulatory Visit (INDEPENDENT_AMBULATORY_CARE_PROVIDER_SITE_OTHER): Payer: PPO | Admitting: General Practice

## 2019-03-16 DIAGNOSIS — Z7901 Long term (current) use of anticoagulants: Secondary | ICD-10-CM | POA: Diagnosis not present

## 2019-03-16 DIAGNOSIS — Z8673 Personal history of transient ischemic attack (TIA), and cerebral infarction without residual deficits: Secondary | ICD-10-CM

## 2019-03-16 LAB — POCT INR: INR: 3.1 — AB (ref 2.0–3.0)

## 2019-03-16 NOTE — Patient Instructions (Signed)
Pre visit review using our clinic review tool, if applicable. No additional management support is needed unless otherwise documented below in the visit note.  Take 1/2  tablet today(10/8) and then continue to take 1/2 tablet (3.75mg ) daily EXCEPT for 1 tablet on Sundays Tuesdays and Thursdays.   Re-check in 4 weeks.

## 2019-04-17 ENCOUNTER — Other Ambulatory Visit: Payer: Self-pay | Admitting: Family Medicine

## 2019-04-19 ENCOUNTER — Telehealth: Payer: Self-pay | Admitting: *Deleted

## 2019-04-19 NOTE — Telephone Encounter (Signed)
Left message on voicemail for patient to call back. When patient calls back he needs to be advised to come into the office for his appointment Thursday. . A coivd screening needs to be done and documented on the appointment notes.

## 2019-04-20 ENCOUNTER — Other Ambulatory Visit: Payer: Self-pay

## 2019-04-20 ENCOUNTER — Ambulatory Visit (INDEPENDENT_AMBULATORY_CARE_PROVIDER_SITE_OTHER): Payer: PPO | Admitting: General Practice

## 2019-04-20 DIAGNOSIS — I4891 Unspecified atrial fibrillation: Secondary | ICD-10-CM

## 2019-04-20 DIAGNOSIS — Z7901 Long term (current) use of anticoagulants: Secondary | ICD-10-CM

## 2019-04-20 DIAGNOSIS — Z8673 Personal history of transient ischemic attack (TIA), and cerebral infarction without residual deficits: Secondary | ICD-10-CM

## 2019-04-20 LAB — POCT INR: INR: 1.7 — AB (ref 2.0–3.0)

## 2019-04-20 NOTE — Patient Instructions (Addendum)
Pre visit review using our clinic review tool, if applicable. No additional management support is needed unless otherwise documented below in the visit note.  Take an extra 1/2  tablet today(11/12) and then continue to take 1/2 tablet (3.75mg ) daily EXCEPT for 1 tablet on Sundays Tuesdays and Thursdays.   Re-check in 4 weeks.

## 2019-04-21 ENCOUNTER — Ambulatory Visit (INDEPENDENT_AMBULATORY_CARE_PROVIDER_SITE_OTHER): Payer: PPO | Admitting: Primary Care

## 2019-04-21 ENCOUNTER — Encounter: Payer: Self-pay | Admitting: Primary Care

## 2019-04-21 VITALS — BP 120/82 | HR 64 | Temp 96.9°F | Ht 71.0 in | Wt 186.2 lb

## 2019-04-21 DIAGNOSIS — R300 Dysuria: Secondary | ICD-10-CM | POA: Insufficient documentation

## 2019-04-21 DIAGNOSIS — R350 Frequency of micturition: Secondary | ICD-10-CM

## 2019-04-21 LAB — POC URINALSYSI DIPSTICK (AUTOMATED)
Bilirubin, UA: NEGATIVE
Blood, UA: NEGATIVE
Glucose, UA: NEGATIVE
Leukocytes, UA: NEGATIVE
Nitrite, UA: NEGATIVE
Protein, UA: POSITIVE — AB
Spec Grav, UA: 1.025 (ref 1.010–1.025)
Urobilinogen, UA: 0.2 E.U./dL
pH, UA: 6 (ref 5.0–8.0)

## 2019-04-21 MED ORDER — CEPHALEXIN 500 MG PO CAPS: 500.0000 mg | ORAL_CAPSULE | Freq: Two times a day (BID) | ORAL | 0 refills | Status: AC

## 2019-04-21 NOTE — Progress Notes (Signed)
Subjective:    Patient ID: [name], male    DOB: June 03, 1946, 73 y.o.   MRN: [id number]  HPI  Mr. Buckmaster is a 73 year old male with a history of CAD, hypertension, testicular atrophy, prostatitis, CVA, erectile dysfunction, chronic fatigue, anxiety and depression who presents today with a chief complaint of urinary frequency.  He also reports urethral burning that is constant, soreness in the prostate region, difficulty urinating. Symptoms began about 2-3 days ago. He believes his symptoms are from prostatitis as he's had this occur in the past. His last bout of prostatitis was 10 years ago, underwent numerous tests and was eventually treated with Cipro for one month with resolve. He denies abdominal pain, radiation of pain, flank pain, hematuria, penile discharge.   Review of Systems  Constitutional: Negative for fever.  Gastrointestinal: Negative for abdominal pain and nausea.  Genitourinary: Positive for difficulty urinating, dysuria and frequency. Negative for hematuria, penile pain, penile swelling, scrotal swelling, testicular pain and urgency.       Past Medical History:  Diagnosis Date  . BPH (benign prostatic hyperplasia)   . Coronary artery disease    a. s/p PCI to LCx in 2007; b. MV 2017 no sig ischemia, EF 59%, nl study  . Depression   . GERD (gastroesophageal reflux disease)   . Hyperlipidemia   . Hypertension   . IBS (irritable bowel syndrome)   . Myocardial infarction ([place])    12-13 yrs ago   . PAF (paroxysmal atrial fibrillation) ([place])    a. CHADS2VASc => 5 (HTN, age x 1, stroke x 2, vascular disease); b. on Coumadin   . PNA (pneumonia)   . Skin cancer    face   . Sleep apnea    off cpap   . Stroke [facility])    a. x 2 with residual left-sided weakness     Social History   Socioeconomic History  . Marital status: Married    Spouse name: Not on file  . Number of children: 1  . Years of education: Not on file  . Highest education level: Not on file   Occupational History  . Occupation: retired  Scientific laboratory technician  . Financial resource strain: Not hard at all  . Food insecurity    Worry: Never true    Inability: Never true  . Transportation needs    Medical: No    Non-medical: No  Tobacco Use  . Smoking status: Former Research scientist (life sciences)  . Smokeless tobacco: Never Used  Substance and Sexual Activity  . Alcohol use: Yes    Alcohol/week: 2.0 - 3.0 standard drinks    Types: 2 - 3 Glasses of wine per week    Comment: Wine daily with dinner  . Drug use: No  . Sexual activity: Never  Lifestyle  . Physical activity    Days per week: Not on file    Minutes per session: Not on file  . Stress: Not on file  Relationships  . Social Herbalist on phone: Not on file    Gets together: Not on file    Attends religious service: Not on file    Active member of club or organization: Not on file    Attends meetings of clubs or organizations: Not on file    Relationship status: Not on file  . Intimate partner violence    Fear of current or ex partner: No    Emotionally abused: No    Physically abused: No  Forced sexual activity: No  Other Topics Concern  . Not on file  Social History Narrative  . Not on file    Past Surgical History:  Procedure Laterality Date  . ADENOIDECTOMY  1951  . CARDIAC CATHETERIZATION  2006   X1 STENT  . COLONOSCOPY    . POLYPECTOMY    . SKIN CANCER EXCISION N/A 08/2016   [facility]  . THYROIDECTOMY, PARTIAL    . UPPER GASTROINTESTINAL ENDOSCOPY      Family History  Problem Relation Age of Onset  . Heart Problems Father   . Heart disease Father   . Hypertension Father   . Mental illness Mother   . Arthritis Mother   . Cancer Mother        ovarian  . Stroke Paternal Uncle   . Heart disease Paternal Grandfather   . Cancer Maternal Aunt   . Stroke Maternal Grandmother   . Sudden death Maternal Grandfather   . Heart disease Paternal Grandmother   . Colon  cancer Neg Hx   . Esophageal cancer Neg Hx   . Rectal cancer Neg Hx   . Colon polyps Neg Hx   . Stomach cancer Neg Hx     Allergies  Allergen Reactions  . Bactrim [Sulfamethoxazole-Trimethoprim] Anaphylaxis    Ulcers  . Crestor [Rosuvastatin Calcium]     Severe Myalgias    Current Outpatient Medications on File Prior to Visit  Medication Sig Dispense Refill  . amiodarone (PACERONE) 100 MG tablet Take 1 tablet (100 mg total) by mouth daily. 90 tablet 3  . amLODipine (NORVASC) 5 MG tablet TAKE 1 TABLET BY MOUTH EVERY DAY FOR BLOOD PRESSURE 30 tablet 2  . aspirin 81 MG tablet Take 81 mg by mouth daily.    . bisoprolol (ZEBETA) 10 MG tablet TAKE 1.5 TABLETS BY MOUTH EVERY DAY 135 tablet 1  . Cholecalciferol (VITAMIN D3) 50 MCG (2000 UT) TABS Take 1 tablet by mouth daily with supper.    . DULoxetine (CYMBALTA) 20 MG capsule Take 40 mg by mouth daily.     . Evolocumab (REPATHA SURECLICK) [id number] MG/ML SOAJ Inject 140 mg into the skin every 14 (fourteen) days. 2 pen 11  . furosemide (LASIX) 20 MG tablet Take 1 tablet (20 mg total) by mouth as directed. Take on Monday, Wednesday and Friday weekly (Days when not taking HCTZ). 30 tablet 6  . hydrochlorothiazide (HYDRODIURIL) 12.5 MG tablet Take 1 tablet (12.5 mg total) by mouth daily. 90 tablet 0  . lisinopril (PRINIVIL,ZESTRIL) 40 MG tablet Take 1 tablet (40 mg total) by mouth daily. For blood pressure. 90 tablet 3  . nitroGLYCERIN (NITROSTAT) 0.4 MG SL tablet Place 1 tablet (0.4 mg total) every 5 (five) minutes as needed under the tongue for chest pain. 25 tablet 3  . omeprazole (PRILOSEC) 40 MG capsule Take 1 capsule (40 mg total) by mouth daily. For heartburn. 90 capsule 1  . triamcinolone cream (KENALOG) 0.1 % Apply 1 application topically 2 (two) times daily. 30 g 0  . vitamin B-12 (CYANOCOBALAMIN) 1000 MCG tablet Take 1,000 mcg by mouth daily.    Marland Kitchen warfarin (COUMADIN) 7.5 MG tablet Take 0.5-1 tablets (3.75-7.5 mg total) by mouth See admin  instructions. Take 7.5mg  on sundays, all other days take 3.75mg  or as directed by coumadin clinic. 108 tablet 1  . zaleplon (SONATA) 5 MG capsule Take 5 mg by mouth at bedtime as needed.     No current facility-administered  medications on file prior to visit.     BP 120/82   Pulse 64   Temp (!) 96.9 F (36.1 C) (Temporal)   Ht 5\' 11"  (1.803 m)   Wt 186 lb 4 oz (84.5 kg)   SpO2 98%   BMI 25.98 kg/m    Objective:   Physical Exam  Constitutional: He appears well-nourished.  Cardiovascular: Normal rate.  Respiratory: Effort normal.  Genitourinary: Rectum:     No rectal mass.  Prostate is enlarged. Prostate is not tender.    Genitourinary Comments: Mild discomfort without overt tenderness during prostate exam. Slightly enlarged/boggy prostate noted. Perianal exam unremarkable. Chaperone present.   Skin: Skin is warm and dry.           Assessment & Plan:

## 2019-04-21 NOTE — Patient Instructions (Signed)
Start Cephalexin antibiotics for the infection. Take 1 capsule by mouth twice daily for 5 days.  I'll be in touch with lab results once received.  Please update me Monday next week as discussed.  It was a pleasure to see you today!

## 2019-04-21 NOTE — Assessment & Plan Note (Signed)
Also with frequency and "prostate" discomfort. Exam today overall without obvious abnormality, does have mildly enlarged prostate without significant tenderness. Appears well, no distress.  UA today with trace protein and ketones, otherwise no leuks or blood. Culture sent. It did take him quite some time to provide [place] with a urine sample do to difficulty urinating.   Given history coupled with discomfort we will check PSA and CBC. We will treat him for now with oral antibiotics with cephalexin course. Cannot treat with Cipro due to warfarin use, cannot treat with Bactrim due to anaphylaxis.   Await labs. He will update.

## 2019-04-22 LAB — BASIC METABOLIC PANEL
BUN: 18 mg/dL (ref 7–25)
CO2: 28 mmol/L (ref 20–32)
Calcium: 9.7 mg/dL (ref 8.6–10.3)
Chloride: 100 mmol/L (ref 98–110)
Creat: 1.17 mg/dL (ref 0.70–1.18)
Glucose, Bld: 73 mg/dL (ref 65–99)
Potassium: 4.7 mmol/L (ref 3.5–5.3)
Sodium: 139 mmol/L (ref 135–146)

## 2019-04-22 LAB — URINE CULTURE
MICRO NUMBER:: 1099054
Result:: NO GROWTH
SPECIMEN QUALITY:: ADEQUATE

## 2019-04-22 LAB — CBC WITH DIFFERENTIAL/PLATELET
Absolute Monocytes: 832 cells/uL (ref 200–950)
Basophils Absolute: 32 cells/uL (ref 0–200)
Basophils Relative: 0.5 %
Eosinophils Absolute: 202 cells/uL (ref 15–500)
Eosinophils Relative: 3.2 %
HCT: 41.7 % (ref 38.5–50.0)
Hemoglobin: 14.4 g/dL (ref 13.2–17.1)
Lymphs Abs: 1701 cells/uL (ref 850–3900)
MCH: 32.1 pg (ref 27.0–33.0)
MCHC: 34.5 g/dL (ref 32.0–36.0)
MCV: 92.9 fL (ref 80.0–100.0)
MPV: 10.9 fL (ref 7.5–12.5)
Monocytes Relative: 13.2 %
Neutro Abs: 3534 cells/uL (ref 1500–7800)
Neutrophils Relative %: 56.1 %
Platelets: 246 10*3/uL (ref 140–400)
RBC: 4.49 10*6/uL (ref 4.20–5.80)
RDW: 13.2 % (ref 11.0–15.0)
Total Lymphocyte: 27 %
WBC: 6.3 10*3/uL (ref 3.8–10.8)

## 2019-04-22 LAB — PSA: PSA: 1.3 ng/mL (ref <=4.0)

## 2019-04-25 ENCOUNTER — Telehealth: Payer: Self-pay | Admitting: Cardiovascular Disease

## 2019-04-25 NOTE — Telephone Encounter (Signed)
Patient dropped off Patient Assistance forms to be completed Placed in nurse box  

## 2019-04-28 NOTE — Telephone Encounter (Signed)
Spoke with patient and advised that application has been completed and that I will fax in today. I did inform him that the application states it will not be processed until January 2021 and that if they need additional information they would reach out to him. He was appreciative for the call with no further questions.

## 2019-05-16 ENCOUNTER — Other Ambulatory Visit: Payer: Self-pay | Admitting: Gastroenterology

## 2019-05-16 DIAGNOSIS — K219 Gastro-esophageal reflux disease without esophagitis: Secondary | ICD-10-CM

## 2019-05-18 ENCOUNTER — Other Ambulatory Visit: Payer: Self-pay

## 2019-05-18 ENCOUNTER — Ambulatory Visit (INDEPENDENT_AMBULATORY_CARE_PROVIDER_SITE_OTHER): Payer: PPO | Admitting: General Practice

## 2019-05-18 DIAGNOSIS — Z8673 Personal history of transient ischemic attack (TIA), and cerebral infarction without residual deficits: Secondary | ICD-10-CM

## 2019-05-18 DIAGNOSIS — Z7901 Long term (current) use of anticoagulants: Secondary | ICD-10-CM | POA: Diagnosis not present

## 2019-05-18 LAB — POCT INR: INR: 2.5 (ref 2.0–3.0)

## 2019-05-18 NOTE — Patient Instructions (Signed)
Pre visit review using our clinic review tool, if applicable. No additional management support is needed unless otherwise documented below in the visit note.  Continue to take 1/2 tablet (3.75mg ) daily EXCEPT for 1 tablet on Sundays Tuesdays and Thursdays.   Re-check in 4 weeks.

## 2019-05-25 ENCOUNTER — Telehealth: Payer: Self-pay | Admitting: Cardiovascular Disease

## 2019-05-25 NOTE — Telephone Encounter (Signed)
Patient calling States he is no longer eligible to receive Repatha medication  Would like to know what suggestions we have to replace Please call to discuss

## 2019-05-26 NOTE — Telephone Encounter (Signed)
Application has been updated and faxed to assistance program. Patient is aware that if he does not hear back before getting low on his supply to call back.

## 2019-05-26 NOTE — Telephone Encounter (Signed)
Spoke with patient and he states that he no longer qualifies for assistance for Repatha and he is not able to afford this medication. He states that it is costing him $100 per month and would like to know what other options are available. Reviewed previous application for assistance and there were a few lines that were not completed so offered to resubmit to see if this will make a difference. He reports having enough to last into mid January and advised that if this does not work to please let us know and I will then reach out to provider for any further recommendations. He verbalized understanding of our conversation, agreement with plan, and had no further questions at this time.

## 2019-06-07 DIAGNOSIS — T466X5A Adverse effect of antihyperlipidemic and antiarteriosclerotic drugs, initial encounter: Secondary | ICD-10-CM | POA: Insufficient documentation

## 2019-06-07 DIAGNOSIS — G720 Drug-induced myopathy: Secondary | ICD-10-CM | POA: Insufficient documentation

## 2019-06-09 DIAGNOSIS — J449 Chronic obstructive pulmonary disease, unspecified: Secondary | ICD-10-CM

## 2019-06-09 HISTORY — DX: Chronic obstructive pulmonary disease, unspecified: J44.9

## 2019-06-11 ENCOUNTER — Other Ambulatory Visit: Payer: Self-pay | Admitting: Primary Care

## 2019-06-11 DIAGNOSIS — I10 Essential (primary) hypertension: Secondary | ICD-10-CM

## 2019-06-15 ENCOUNTER — Ambulatory Visit (INDEPENDENT_AMBULATORY_CARE_PROVIDER_SITE_OTHER): Payer: PPO

## 2019-06-15 ENCOUNTER — Other Ambulatory Visit: Payer: Self-pay

## 2019-06-15 DIAGNOSIS — Z7901 Long term (current) use of anticoagulants: Secondary | ICD-10-CM

## 2019-06-15 LAB — POCT INR: INR: 2.2 (ref 2.0–3.0)

## 2019-06-15 NOTE — Patient Instructions (Addendum)
Pre visit review using our clinic review tool, if applicable. No additional management support is needed unless otherwise documented below in the visit note.  Continue to take 1/2 tablet (3.75mg ) daily EXCEPT for 1 tablet on Sundays Tuesdays and Thursdays.   Re-check in 4 weeks.

## 2019-06-20 DIAGNOSIS — F330 Major depressive disorder, recurrent, mild: Secondary | ICD-10-CM | POA: Diagnosis not present

## 2019-06-20 DIAGNOSIS — F411 Generalized anxiety disorder: Secondary | ICD-10-CM | POA: Diagnosis not present

## 2019-06-29 ENCOUNTER — Telehealth: Payer: Self-pay | Admitting: Cardiovascular Disease

## 2019-06-29 NOTE — Telephone Encounter (Signed)
Spoke with Repatha assistance program and they report patient does not qualify for assistance this year. Called and updated patient and he wants Dr. Rockey Situ to be aware and any recommendations. I did review with him that once deductible is met he should be $45 per month but he states right now it is just too expensive. Will make provider aware he did not get coverage through patient assistance and he only has one pen left.

## 2019-06-29 NOTE — Telephone Encounter (Signed)
Spoke with patient and his wife per release form. He wanted to clarify his medications and reviewed that he should be taking hctz 12.5 mg once daily which was prescribed by Alma Friendly NP and then we had him on Furosemide 20 mg three times a week as needed. Confirmed these were the medications that we had listed and instructions. Patients wife states that he doesn't feel like he needs the furosemide now. She wanted to know if he stops that would the ascites return. I did make her aware that was possible and to certainly monitor for that. They both verbalized understanding. Patient has not heard back from assistance program yet for his repatha. Let him know that I would call to check on the status. They verbalized understanding of our conversation, agreement with plan, and had no further questions at this time.

## 2019-06-29 NOTE — Telephone Encounter (Signed)
Patient is calling in regarding his 2 diuretic medications. Patient would like to be advised on which is the better option and to continue with that one  Please advise when able

## 2019-06-30 ENCOUNTER — Other Ambulatory Visit: Payer: Self-pay | Admitting: Primary Care

## 2019-06-30 NOTE — Telephone Encounter (Signed)
Received faxed refill request. Have not prescribed. Last seen on 04/21/2019. No upcoming appointment.

## 2019-06-30 NOTE — Telephone Encounter (Signed)
This was last ordered in early January 2020 for 90 tablets, no refills. He is really overdue for general follow up and needs to be seen. Please schedule.  Also, has he just not been taking this as it would have run out in April 2020?

## 2019-07-03 NOTE — Telephone Encounter (Signed)
Will ask patient again to schedule appointment.  Per MyChart message:  I have not been taking it but I have a little water build up and I think I need to start again.

## 2019-07-04 NOTE — Telephone Encounter (Signed)
Send patient a message through [employer]. Waiting on response.

## 2019-07-04 NOTE — Telephone Encounter (Signed)
I do not feel comfortable filling this until he's evaluated. He could request a refill from cardiology if he feels the need to resume before evaluation.

## 2019-07-07 ENCOUNTER — Other Ambulatory Visit: Payer: Self-pay | Admitting: Primary Care

## 2019-07-07 DIAGNOSIS — I10 Essential (primary) hypertension: Secondary | ICD-10-CM

## 2019-07-07 NOTE — Telephone Encounter (Signed)
Overdue for office visit for general follow up. Please schedule and then notify me when this has been done. Will send refills once he schedules.

## 2019-07-07 NOTE — Telephone Encounter (Signed)
Message left for patient to return my call.  

## 2019-07-07 NOTE — Telephone Encounter (Signed)
Last prescribed on 01/10/2019 and before it was prescribed by Dr Glori Bickers on 10/06/2018. Last appointment on 04/21/2019 (acute). No future appointment

## 2019-07-10 NOTE — Telephone Encounter (Signed)
Noted, refill sent to pharmacy. 

## 2019-07-10 NOTE — Telephone Encounter (Signed)
Patient made an appointment on 07/14/2019

## 2019-07-12 ENCOUNTER — Telehealth: Payer: Self-pay

## 2019-07-12 ENCOUNTER — Telehealth: Payer: Self-pay | Admitting: Cardiovascular Disease

## 2019-07-12 NOTE — Telephone Encounter (Signed)
Patient calling back in regarding which statin patient needs to begin.  Please advise when able

## 2019-07-12 NOTE — Telephone Encounter (Signed)
Pt has coumadin clinic apt tomorrow and needs screened for covid.  No answer and no VM

## 2019-07-13 ENCOUNTER — Ambulatory Visit (INDEPENDENT_AMBULATORY_CARE_PROVIDER_SITE_OTHER): Payer: PPO

## 2019-07-13 ENCOUNTER — Other Ambulatory Visit: Payer: Self-pay

## 2019-07-13 DIAGNOSIS — Z7901 Long term (current) use of anticoagulants: Secondary | ICD-10-CM

## 2019-07-13 LAB — POCT INR: INR: 2.1 (ref 2.0–3.0)

## 2019-07-13 NOTE — Patient Instructions (Addendum)
Pre visit review using our clinic review tool, if applicable. No additional management support is needed unless otherwise documented below in the visit note.  Continue to take 1/2 tablet (3.75mg ) daily EXCEPT for 1 tablet on Sundays Tuesdays and Thursdays.   Re-check in 4 weeks.

## 2019-07-14 ENCOUNTER — Ambulatory Visit (INDEPENDENT_AMBULATORY_CARE_PROVIDER_SITE_OTHER): Payer: PPO | Admitting: Primary Care

## 2019-07-14 ENCOUNTER — Encounter: Payer: Self-pay | Admitting: Primary Care

## 2019-07-14 VITALS — BP 126/76 | HR 68 | Temp 96.3°F | Ht 71.0 in | Wt 194.5 lb

## 2019-07-14 DIAGNOSIS — J449 Chronic obstructive pulmonary disease, unspecified: Secondary | ICD-10-CM

## 2019-07-14 DIAGNOSIS — I25118 Atherosclerotic heart disease of native coronary artery with other forms of angina pectoris: Secondary | ICD-10-CM | POA: Diagnosis not present

## 2019-07-14 DIAGNOSIS — F419 Anxiety disorder, unspecified: Secondary | ICD-10-CM | POA: Diagnosis not present

## 2019-07-14 DIAGNOSIS — K219 Gastro-esophageal reflux disease without esophagitis: Secondary | ICD-10-CM

## 2019-07-14 DIAGNOSIS — F329 Major depressive disorder, single episode, unspecified: Secondary | ICD-10-CM

## 2019-07-14 DIAGNOSIS — I10 Essential (primary) hypertension: Secondary | ICD-10-CM

## 2019-07-14 DIAGNOSIS — Z7901 Long term (current) use of anticoagulants: Secondary | ICD-10-CM

## 2019-07-14 DIAGNOSIS — L309 Dermatitis, unspecified: Secondary | ICD-10-CM

## 2019-07-14 DIAGNOSIS — E782 Mixed hyperlipidemia: Secondary | ICD-10-CM | POA: Diagnosis not present

## 2019-07-14 DIAGNOSIS — I4891 Unspecified atrial fibrillation: Secondary | ICD-10-CM

## 2019-07-14 MED ORDER — PRAVASTATIN SODIUM 40 MG PO TABS: 40.0000 mg | ORAL_TABLET | Freq: Every day | ORAL | 3 refills | Status: DC

## 2019-07-14 MED ORDER — HYDROCHLOROTHIAZIDE 12.5 MG PO TABS: 12.5000 mg | ORAL_TABLET | Freq: Every day | ORAL | 3 refills | Status: DC

## 2019-07-14 NOTE — Assessment & Plan Note (Signed)
Using triamcinolone PRN for nasal eczema, continue same.

## 2019-07-14 NOTE — Assessment & Plan Note (Signed)
Managed on warfarin for paroxysmal atrial fibrillation. Followed by warfarin clinic.

## 2019-07-14 NOTE — Patient Instructions (Signed)
Stop by the lab prior to leaving today. I will notify you of your results once received.   Start pravastatin 40 mg tablets at bedtime for cholesterol.  Try Tylenol [phone] mg every 8 hours for pain. Do not exceed 3000 mg in 24 hours.  Schedule a lab only appointment for 2 months to repeat cholesterol.  It was a pleasure to see you today!

## 2019-07-14 NOTE — Assessment & Plan Note (Signed)
Asymptomatic.  Continue BP control, add statin therapy.

## 2019-07-14 NOTE — Assessment & Plan Note (Signed)
Can no longer afford Repatha. Could not tolerate Crestor in the past, does need statin therapy and is open to trying another agent.  Will trial pravastatin due to lack of interactions with current medications. Repeat lipids in 2 months.

## 2019-07-14 NOTE — Assessment & Plan Note (Signed)
Stable in the office today. Continue current regimen, updated medication list.

## 2019-07-14 NOTE — Assessment & Plan Note (Signed)
Will try to wean him down slowly. Will have him reduce to 40 mg daily for now, then 20 mg.

## 2019-07-14 NOTE — Telephone Encounter (Signed)
Patient returning call.

## 2019-07-14 NOTE — Telephone Encounter (Signed)
No answer.  No voicemail setup

## 2019-07-14 NOTE — Assessment & Plan Note (Signed)
Rate and rhythm regular, continue current regimen.

## 2019-07-14 NOTE — Progress Notes (Signed)
Subjective:    Patient ID: [name], male    DOB: 14-Apr-1946, 74 y.o.   MRN: [id number]  HPI  This visit occurred during the SARS-CoV-2 public health emergency.  Safety protocols were in place, including screening questions prior to the visit, additional usage of staff PPE, and extensive cleaning of exam room while observing appropriate contact time as indicated for disinfecting solutions.   [name] is a 74 year old male who presents today for general follow up.  1) Atrial Fibrillation/CAD/Hypertension: Currently managed on amidarone 100 mg, amlodipine 5 mg, bisoprolol 10 mg, Repatha, furosemide 20 mg, HCTZ 12.5 mg, lisinopril 40 mg, warfarin.  No longer on Repatha due to cost of insurance. He cannot tolerate Crestor due to significant myalgias of his lower extremities. He's tried no other statin therapies   BP Readings from Last 3 Encounters:  07/14/19 126/76  04/21/19 120/82  01/09/19 118/70   Wt Readings from Last 3 Encounters:  07/14/19 194 lb 8 oz (88.2 kg)  04/21/19 186 lb 4 oz (84.5 kg)  01/09/19 184 lb 8 oz (83.7 kg)     2) Anxiety and Depression: Currently managed on duloxetine 20 mg and zaleplon 5 mg. Following with psychiatry.   3) GERD: Currently managed on omeprazole 40 mg BID. He has not tried a dose reduction.   4) Chronic Joint Pain: Describes symptoms of achy, stiff. Located to numerous joints including elbows, hands, wrists, knees, across. Currently not managed on statin therapy at this time. Symptoms began 6-7 months ago. He's been taking Tylenol 500 mg at bedtime most nights without improvements.   Review of Systems  Eyes: Negative for visual disturbance.  Respiratory: Negative for shortness of breath.   Cardiovascular: Negative for chest pain.  Gastrointestinal:       GERD  Neurological: Negative for dizziness and headaches.       Past Medical History:  Diagnosis Date  . BPH (benign prostatic hyperplasia)   . Coronary artery disease      a. s/p PCI to LCx in 2007; b. MV 2017 no sig ischemia, EF 59%, nl study  . Depression   . GERD (gastroesophageal reflux disease)   . Hyperlipidemia   . Hypertension   . IBS (irritable bowel syndrome)   . Myocardial infarction ([place])    12-13 yrs ago   . PAF (paroxysmal atrial fibrillation) ([place])    a. CHADS2VASc => 5 (HTN, age x 1, stroke x 2, vascular disease); b. on Coumadin   . PNA (pneumonia)   . Skin cancer    face   . Sleep apnea    off cpap   . Stroke [facility])    a. x 2 with residual left-sided weakness     Social History   Socioeconomic History  . Marital status: Married    Spouse name: Not on file  . Number of children: 1  . Years of education: Not on file  . Highest education level: Not on file  Occupational History  . Occupation: retired  Tobacco Use  . Smoking status: Former Research scientist (life sciences)  . Smokeless tobacco: Never Used  Substance and Sexual Activity  . Alcohol use: Yes    Alcohol/week: 2.0 - 3.0 standard drinks    Types: 2 - 3 Glasses of wine per week    Comment: Wine daily with dinner  . Drug use: No  . Sexual activity: Never  Other Topics Concern  . Not on file  Social History Narrative  . Not on file  Social Determinants of Health   Financial Resource Strain:   . Difficulty of Paying Living Expenses: Not on file  Food Insecurity:   . Worried About Charity fundraiser in the Last Year: Not on file  . Ran Out of Food in the Last Year: Not on file  Transportation Needs:   . Lack of Transportation (Medical): Not on file  . Lack of Transportation (Non-Medical): Not on file  Physical Activity:   . Days of Exercise per Week: Not on file  . Minutes of Exercise per Session: Not on file  Stress:   . Feeling of Stress : Not on file  Social Connections:   . Frequency of Communication with Friends and Family: Not on file  . Frequency of Social Gatherings with Friends and Family: Not on file  . Attends Religious Services: Not on file  . Active Member of  Clubs or Organizations: Not on file  . Attends Archivist Meetings: Not on file  . Marital Status: Not on file  Intimate Partner Violence:   . Fear of Current or Ex-Partner: Not on file  . Emotionally Abused: Not on file  . Physically Abused: Not on file  . Sexually Abused: Not on file    Past Surgical History:  Procedure Laterality Date  . ADENOIDECTOMY  1951  . CARDIAC CATHETERIZATION  2006   X1 STENT  . COLONOSCOPY    . POLYPECTOMY    . SKIN CANCER EXCISION N/A 08/2016   Forehead.  [facility] (Dr. Deliah Boston)  . THYROIDECTOMY, PARTIAL    . UPPER GASTROINTESTINAL ENDOSCOPY      Family History  Problem Relation Age of Onset  . Heart Problems Father   . Heart disease Father   . Hypertension Father   . Mental illness Mother   . Arthritis Mother   . Cancer Mother        ovarian  . Stroke Paternal Uncle   . Heart disease Paternal Grandfather   . Cancer Maternal Aunt   . Stroke Maternal Grandmother   . Sudden death Maternal Grandfather   . Heart disease Paternal Grandmother   . Colon cancer Neg Hx   . Esophageal cancer Neg Hx   . Rectal cancer Neg Hx   . Colon polyps Neg Hx   . Stomach cancer Neg Hx     Allergies  Allergen Reactions  . Bactrim [Sulfamethoxazole-Trimethoprim] Anaphylaxis    Ulcers  . Crestor [Rosuvastatin Calcium]     Severe Myalgias    Current Outpatient Medications on File Prior to Visit  Medication Sig Dispense Refill  . amiodarone (PACERONE) 100 MG tablet Take 1 tablet (100 mg total) by mouth daily. 90 tablet 3  . amLODipine (NORVASC) 5 MG tablet TAKE 1 TABLET BY MOUTH EVERY DAY FOR BLOOD PRESSURE 90 tablet 1  . aspirin 81 MG tablet Take 81 mg by mouth daily.    . bisoprolol (ZEBETA) 10 MG tablet TAKE 1 AND 1/2 TABLETS BY MOUTH EVERY DAY for blood pressure. 135 tablet 0  . Cholecalciferol (VITAMIN D3) 50 MCG (2000 UT) TABS Take 1 tablet by mouth daily with supper.    . DULoxetine (CYMBALTA) 20 MG capsule Take 40 mg  by mouth daily.     Marland Kitchen lisinopril (PRINIVIL,ZESTRIL) 40 MG tablet Take 1 tablet (40 mg total) by mouth daily. For blood pressure. 90 tablet 3  . nitroGLYCERIN (NITROSTAT) 0.4 MG SL tablet Place 1 tablet (0.4 mg total) every 5 (five) minutes as needed under  the tongue for chest pain. 25 tablet 3  . omeprazole (PRILOSEC) 40 MG capsule TAKE 1 CAPSULE (40 MG TOTAL) BY MOUTH 2 (TWO) TIMES DAILY BEFORE A MEAL. FOR HEARTBURN. 180 capsule 1  . triamcinolone cream (KENALOG) 0.1 % Apply 1 application topically 2 (two) times daily. 30 g 0  . vitamin B-12 (CYANOCOBALAMIN) 1000 MCG tablet Take 1,000 mcg by mouth daily.    Marland Kitchen warfarin (COUMADIN) 7.5 MG tablet Take 0.5-1 tablets (3.75-7.5 mg total) by mouth See admin instructions. Take 7.5mg  on sundays, all other days take 3.75mg  or as directed by coumadin clinic. 108 tablet 1  . zaleplon (SONATA) 5 MG capsule Take 5 mg by mouth at bedtime as needed.     No current facility-administered medications on file prior to visit.    BP 126/76   Pulse 68   Temp (!) 96.3 F (35.7 C) (Temporal)   Ht 5\' 11"  (1.803 m)   Wt 194 lb 8 oz (88.2 kg)   SpO2 98%   BMI 27.13 kg/m    Objective:   Physical Exam  Constitutional: He appears well-nourished.  Cardiovascular: Normal rate and regular rhythm.  Respiratory: Effort normal and breath sounds normal.  Musculoskeletal:     Cervical back: Neck supple.  Skin: Skin is warm and dry.  Psychiatric: He has a normal mood and affect.           Assessment & Plan:

## 2019-07-14 NOTE — Assessment & Plan Note (Signed)
Following with psychiatry, feels well managed. 

## 2019-07-14 NOTE — Assessment & Plan Note (Signed)
No symptoms. Continue to monitor.

## 2019-07-14 NOTE — Telephone Encounter (Signed)
Spoke with patient and he reports that he is just not able to afford his Repatha. It will cost him $100 per month and he just can't afford that. We did apply for assistance but he did not qualify. His last injection 2 weeks ago. He did give me the names of some medications that he can afford as listed below. Advised that I would send this to provider for review and recommendations and would then be in touch with him. He was appreciative for the call with no further questions at this time.    atorvastation pravastation simvastatin

## 2019-07-15 LAB — LIPID PANEL
Cholesterol: 148 mg/dL (ref <200)
HDL: 54 mg/dL (ref >=40)
LDL Cholesterol (Calc): 67 mg/dL (calc)
Non-HDL Cholesterol (Calc): 94 mg/dL (calc) (ref <130)
Total CHOL/HDL Ratio: 2.7 (calc) (ref <5.0)
Triglycerides: 206 mg/dL — ABNORMAL HIGH (ref <150)

## 2019-07-15 LAB — CBC
HCT: 40.6 % (ref 38.5–50.0)
Hemoglobin: 13.8 g/dL (ref 13.2–17.1)
MCH: 30.8 pg (ref 27.0–33.0)
MCHC: 34 g/dL (ref 32.0–36.0)
MCV: 90.6 fL (ref 80.0–100.0)
MPV: 10.7 fL (ref 7.5–12.5)
Platelets: 225 10*3/uL (ref 140–400)
RBC: 4.48 10*6/uL (ref 4.20–5.80)
RDW: 14.1 % (ref 11.0–15.0)
WBC: 6.1 10*3/uL (ref 3.8–10.8)

## 2019-07-15 LAB — COMPREHENSIVE METABOLIC PANEL
AG Ratio: 1.4 (calc) (ref 1.0–2.5)
ALT: 21 U/L (ref 9–46)
AST: 24 U/L (ref 10–35)
Albumin: 4.1 g/dL (ref 3.6–5.1)
Alkaline phosphatase (APISO): 79 U/L (ref 35–144)
BUN: 14 mg/dL (ref 7–25)
CO2: 24 mmol/L (ref 20–32)
Calcium: 9.4 mg/dL (ref 8.6–10.3)
Chloride: 106 mmol/L (ref 98–110)
Creat: 1.07 mg/dL (ref 0.70–1.18)
Globulin: 3 g/dL (calc) (ref 1.9–3.7)
Glucose, Bld: 93 mg/dL (ref 65–99)
Potassium: 4.6 mmol/L (ref 3.5–5.3)
Sodium: 143 mmol/L (ref 135–146)
Total Bilirubin: 0.5 mg/dL (ref 0.2–1.2)
Total Protein: 7.1 g/dL (ref 6.1–8.1)

## 2019-07-15 LAB — BRAIN NATRIURETIC PEPTIDE: Brain Natriuretic Peptide: 270 pg/mL — ABNORMAL HIGH (ref <100)

## 2019-07-15 NOTE — Telephone Encounter (Signed)
He was seen by PMD, Started on pravastatin 40 daily

## 2019-07-17 NOTE — Telephone Encounter (Signed)
Spoke with patient and he confirmed that he saw PCP and she started him on medication. He was appreciative for the follow up with no further questions at this time.

## 2019-07-18 DIAGNOSIS — D225 Melanocytic nevi of trunk: Secondary | ICD-10-CM | POA: Diagnosis not present

## 2019-07-18 DIAGNOSIS — D2271 Melanocytic nevi of right lower limb, including hip: Secondary | ICD-10-CM | POA: Diagnosis not present

## 2019-07-18 DIAGNOSIS — Z85820 Personal history of malignant melanoma of skin: Secondary | ICD-10-CM | POA: Diagnosis not present

## 2019-07-18 DIAGNOSIS — X32XXXA Exposure to sunlight, initial encounter: Secondary | ICD-10-CM | POA: Diagnosis not present

## 2019-07-18 DIAGNOSIS — D2272 Melanocytic nevi of left lower limb, including hip: Secondary | ICD-10-CM | POA: Diagnosis not present

## 2019-07-18 DIAGNOSIS — C44619 Basal cell carcinoma of skin of left upper limb, including shoulder: Secondary | ICD-10-CM | POA: Diagnosis not present

## 2019-07-18 DIAGNOSIS — Z85828 Personal history of other malignant neoplasm of skin: Secondary | ICD-10-CM | POA: Diagnosis not present

## 2019-07-18 DIAGNOSIS — L570 Actinic keratosis: Secondary | ICD-10-CM | POA: Diagnosis not present

## 2019-07-18 DIAGNOSIS — D2261 Melanocytic nevi of right upper limb, including shoulder: Secondary | ICD-10-CM | POA: Diagnosis not present

## 2019-07-18 DIAGNOSIS — D485 Neoplasm of uncertain behavior of skin: Secondary | ICD-10-CM | POA: Diagnosis not present

## 2019-08-09 ENCOUNTER — Telehealth: Payer: Self-pay

## 2019-08-09 DIAGNOSIS — G4733 Obstructive sleep apnea (adult) (pediatric): Secondary | ICD-10-CM | POA: Diagnosis not present

## 2019-08-09 DIAGNOSIS — I10 Essential (primary) hypertension: Secondary | ICD-10-CM | POA: Diagnosis not present

## 2019-08-09 DIAGNOSIS — I25119 Atherosclerotic heart disease of native coronary artery with unspecified angina pectoris: Secondary | ICD-10-CM | POA: Diagnosis not present

## 2019-08-09 DIAGNOSIS — G4700 Insomnia, unspecified: Secondary | ICD-10-CM | POA: Diagnosis not present

## 2019-08-09 DIAGNOSIS — Z125 Encounter for screening for malignant neoplasm of prostate: Secondary | ICD-10-CM | POA: Diagnosis not present

## 2019-08-09 DIAGNOSIS — F329 Major depressive disorder, single episode, unspecified: Secondary | ICD-10-CM | POA: Diagnosis not present

## 2019-08-09 DIAGNOSIS — I679 Cerebrovascular disease, unspecified: Secondary | ICD-10-CM | POA: Diagnosis not present

## 2019-08-09 DIAGNOSIS — I480 Paroxysmal atrial fibrillation: Secondary | ICD-10-CM | POA: Diagnosis not present

## 2019-08-09 DIAGNOSIS — E785 Hyperlipidemia, unspecified: Secondary | ICD-10-CM | POA: Diagnosis not present

## 2019-08-09 NOTE — Telephone Encounter (Signed)
Noted  

## 2019-08-09 NOTE — Telephone Encounter (Signed)
Pt called to cancel coumadin clinic apt for tomorrow. He is changing PCP's and is now going to Dr. Gorden Harms at [facility]. His new physician will be managing his coumadin also. Advised if anything is needed in the future to contact the office. Cancelled apt for tomorrow. Pt verbalized understanding.   Opened anticoag encounter to resolve episode.

## 2019-08-10 ENCOUNTER — Ambulatory Visit: Payer: PPO

## 2019-08-17 NOTE — Progress Notes (Signed)
Date:  08/18/2019   ID:  [name], DOB [birth year], MRN [id number]  Patient Location:  [name] [address]   Provider location:   [facility], [facility]  PCP:  [name], MD  Cardiologist:  Arvid Right [facility]   Chief Complaint  Patient presents with  . Follow-up    Patient complains of SOB    History of Present Illness:    [name] is a 74 y.o. male  past medical history of Chronic alcohol abuse,  wine per night 3 to 5+ per night PAF,  prior stroke treated with TPA with mild residual left sided weakness,  Depression with chronic fatigue,  chronic anticoagulation therapy with Coumadin  h/o CAD s/p PCI to the LCx in 2007.  mini stroke in 2015 seen on PET scan, did not have symptoms and was on Coumadin at the time. We previously ordered stress test for chest pain, he did not complete this Presents for f/u of his CAD, chest pain  On his visit today he reports several issues Muscles hurt 1-2 years, etiology unclear Possible neuropathy Previously seen by neurology Previously with falls Chronic fatigue On previous office visits has reported shortness of breath  Recently was cranking something, following this felt very short of breath talking 5 minutes to recover Concerned about the breathing  Not on repatha, "cant afford it", patient assistance appears to have run out On pravastatin 1 month, does not feel this is causing his muscle ache  Several weeks ago, almost fainted in shower Etiology unclear Was SOB, weak Recovered without intervention  sednetary at baseline with no regular exercise program, walking with a cane today  Recent lab work reviewed BNP 270 No swelling Total chol 148, LDL67 HCT 13.8  EKG personally reviewed by myself on todays visit Shows normal sinus rhythm rate 61 bpm no significant ST-T wave changes  On prior office visit reported having Chronic insomnia  working with pulmonary  in [place] Reports having obstructive sleep apnea and central sleep apnea Tried bipap  event monitor  1 long stretch of atrial fibrillation lasting over 24 hours otherwise maintain normal sinus rhythm, 6% burden A. Fib  Previous retinal tear, needed laser treatment Concerned it was brought on by the Coumadin, ophthalmology did not think so per the patient   Prior CV studies:   The following studies were reviewed today:   Past Medical History:  Diagnosis Date  . BPH (benign prostatic hyperplasia)   . Coronary artery disease    a. s/p PCI to LCx in 2007; b. MV 2017 no sig ischemia, EF 59%, nl study  . Depression   . GERD (gastroesophageal reflux disease)   . Hyperlipidemia   . Hypertension   . IBS (irritable bowel syndrome)   . Myocardial infarction ([place])    12-13 yrs ago   . PAF (paroxysmal atrial fibrillation) ([place])    a. CHADS2VASc => 5 (HTN, age x 1, stroke x 2, vascular disease); b. on Coumadin   . PNA (pneumonia)   . Skin cancer    face   . Sleep apnea    off cpap   . Stroke [facility])    a. x 2 with residual left-sided weakness   Past Surgical History:  Procedure Laterality Date  . ADENOIDECTOMY  1951  . CARDIAC CATHETERIZATION  2006   X1 STENT  . COLONOSCOPY    . POLYPECTOMY    . SKIN CANCER EXCISION N/A 08/2016  [place].  [facility] (Dr. Deliah Boston)  . THYROIDECTOMY, PARTIAL    . UPPER GASTROINTESTINAL ENDOSCOPY       Current Meds  Medication Sig  . amiodarone (PACERONE) 100 MG tablet Take 1 tablet (100 mg total) by mouth daily.  Marland Kitchen amLODipine (NORVASC) 5 MG tablet TAKE 1 TABLET BY MOUTH EVERY DAY FOR BLOOD PRESSURE  . aspirin 81 MG tablet Take 81 mg by mouth daily.  . bisoprolol (ZEBETA) 10 MG tablet TAKE 1 AND 1/2 TABLETS BY MOUTH EVERY DAY for blood pressure.  . Cholecalciferol (VITAMIN D3) 50 MCG (2000 UT) TABS Take 1 tablet by mouth daily with supper.  . DULoxetine (CYMBALTA) 20 MG capsule Take 40 mg by mouth daily.   .  hydrochlorothiazide (HYDRODIURIL) 12.5 MG tablet Take 1 tablet (12.5 mg total) by mouth daily. For blood pressure.  Marland Kitchen lisinopril (PRINIVIL,ZESTRIL) 40 MG tablet Take 1 tablet (40 mg total) by mouth daily. For blood pressure.  . nitroGLYCERIN (NITROSTAT) 0.4 MG SL tablet Place 1 tablet (0.4 mg total) every 5 (five) minutes as needed under the tongue for chest pain.  Marland Kitchen omeprazole (PRILOSEC) 40 MG capsule TAKE 1 CAPSULE (40 MG TOTAL) BY MOUTH 2 (TWO) TIMES DAILY BEFORE A MEAL. FOR HEARTBURN.  . pravastatin (PRAVACHOL) 40 MG tablet Take 1 tablet (40 mg total) by mouth daily. For cholesterol.  . triamcinolone cream (KENALOG) 0.1 % Apply 1 application topically 2 (two) times daily.  . vitamin B-12 (CYANOCOBALAMIN) 1000 MCG tablet Take 1,000 mcg by mouth daily.  Marland Kitchen warfarin (COUMADIN) 7.5 MG tablet Take 0.5-1 tablets (3.75-7.5 mg total) by mouth See admin instructions. Take 7.5mg  on sundays, all other days take 3.75mg  or as directed by coumadin clinic.  . zaleplon (SONATA) 5 MG capsule Take 5 mg by mouth at bedtime as needed.     Allergies:   Bactrim [sulfamethoxazole-trimethoprim] and Crestor [rosuvastatin calcium]   Social History   Tobacco Use  . Smoking status: Former Research scientist (life sciences)  . Smokeless tobacco: Never Used  Substance Use Topics  . Alcohol use: Yes    Alcohol/week: 2.0 - 3.0 standard drinks    Types: 2 - 3 Glasses of wine per week    Comment: Wine daily with dinner  . Drug use: No     Family Hx: The patient's family history includes Arthritis in his mother; Cancer in his maternal aunt and mother; Heart Problems in his father; Heart disease in his father, paternal grandfather, and paternal grandmother; Hypertension in his father; Mental illness in his mother; Stroke in his maternal grandmother and paternal uncle; Sudden death in his maternal grandfather. There is no history of Colon cancer, Esophageal cancer, Rectal cancer, Colon polyps, or Stomach cancer.  ROS:   Please see the history of  present illness.    Review of Systems  Constitutional: Negative.   HENT: Negative.   Respiratory: Positive for shortness of breath.   Cardiovascular: Negative.   Gastrointestinal: Positive for heartburn and nausea.  Musculoskeletal: Negative.        Leg weakness  Neurological: Negative.   Psychiatric/Behavioral: Negative.   All other systems reviewed and are negative.    Labs/Other Tests and Data Reviewed:    Recent Labs: 07/14/2019: ALT 21; Brain Natriuretic Peptide 270; BUN 14; Creat 1.07; Hemoglobin 13.8; Platelets 225; Potassium 4.6; Sodium 143   Recent Lipid Panel Lab Results  Component Value Date/Time   CHOL 148 07/14/2019 02:39 PM   TRIG 206 (H) 07/14/2019 02:39 PM   HDL 54 07/14/2019 02:39  PM   CHOLHDL 2.7 07/14/2019 02:39 PM   LDLCALC 67 07/14/2019 02:39 PM    Wt Readings from Last 3 Encounters:  08/18/19 187 lb 9.6 oz (85.1 kg)  07/14/19 194 lb 8 oz (88.2 kg)  04/21/19 186 lb 4 oz (84.5 kg)     Exam:    BP 110/80 (BP Location: Left Arm, Patient Position: Sitting, Cuff Size: Normal)   Pulse 61   Ht 6' (1.829 m)   Wt 187 lb 9.6 oz (85.1 kg)   SpO2 98%   BMI 25.44 kg/m  Constitutional:  oriented to person, place, and time. No distress.  Appears weak, walks with a cane difficulty transitioning from chair to table HENT:  Head: Grossly normal Eyes:  no discharge. No scleral icterus.  Neck: No JVD, no carotid bruits  Cardiovascular: Regular rate and rhythm, no murmurs appreciated Pulmonary/Chest: Clear to auscultation bilaterally, no wheezes or rails Abdominal: Soft.  no distension.  no tenderness.  Musculoskeletal: Normal range of motion Neurological:  normal muscle tone. Coordination normal. No atrophy Skin: Skin warm and dry Psychiatric: normal affect, pleasant   ASSESSMENT & PLAN:    Paroxysmal atrial fibrillation (HCC) - On warfarin, He is monitoring blood pressure at home with blood pressure noting arrhythmia at times Recommend he try to correlate  his shortness of breath symptoms with his atrial fibrillation spells  Shortness of breath By review of previous notes, this may be a chronic issue Suspect may be from deconditioning Recommended echocardiogram to rule out structural heart disease Could perform stress testing but he has declined this in the past  Ataxia Prior stroke Walks with a cane Prior falls, has leg weakness Does not have follow-up with neurology  Coronary artery disease involving native coronary artery of native heart without angina pectoris -  Has shortness of breath, no clear anginal symptoms Echocardiogram pending, may need stress test but previously did not follow through  History of stroke -  Typically has Lovenox bridge when coming off his warfarin  Essential hypertension -  Blood pressure running low in the office today Numbers at home typically 120-1 130s No changes to the medications  Hyperlipidemia LDL goal <70 -  Cholesterol at goal  Somnolence -  History of alcohol, depression, sedentary lifestyle, insomnia  Dyspnea on exertion - Echocardiogram as above, recommended regular walking program  Disposition: Follow-up in 12 months   Signed, Ida Rogue, MD  08/18/2019 12:24 [facility] [address] [place], [place], [place] [address]

## 2019-08-18 ENCOUNTER — Encounter: Payer: Self-pay | Admitting: Cardiovascular Disease

## 2019-08-18 ENCOUNTER — Other Ambulatory Visit: Payer: Self-pay

## 2019-08-18 ENCOUNTER — Ambulatory Visit (INDEPENDENT_AMBULATORY_CARE_PROVIDER_SITE_OTHER): Payer: PPO | Admitting: Cardiovascular Disease

## 2019-08-18 VITALS — BP 110/80 | HR 61 | Ht 72.0 in | Wt 187.6 lb

## 2019-08-18 DIAGNOSIS — I10 Essential (primary) hypertension: Secondary | ICD-10-CM | POA: Diagnosis not present

## 2019-08-18 DIAGNOSIS — I25118 Atherosclerotic heart disease of native coronary artery with other forms of angina pectoris: Secondary | ICD-10-CM | POA: Diagnosis not present

## 2019-08-18 DIAGNOSIS — E785 Hyperlipidemia, unspecified: Secondary | ICD-10-CM

## 2019-08-18 DIAGNOSIS — I6310 Cerebral infarction due to embolism of unspecified precerebral artery: Secondary | ICD-10-CM | POA: Diagnosis not present

## 2019-08-18 DIAGNOSIS — I480 Paroxysmal atrial fibrillation: Secondary | ICD-10-CM | POA: Diagnosis not present

## 2019-08-18 DIAGNOSIS — R0600 Dyspnea, unspecified: Secondary | ICD-10-CM

## 2019-08-18 NOTE — Patient Instructions (Addendum)
Please monitor heart rhythm (and blood pressure) when having shortness of breath epsiodes Look for atrial fibrillation spells  For symptoms from atrial fib: Take extra amiodarone and extra 1/2 dose bisoprolol  If breathing does not feel normal, Call the office We might need to do a stress test   We will order a echocardiogram for shortness of breath   Medication Instructions:  No changes  If you need a refill on your cardiac medications before your next appointment, please call your pharmacy.    Lab work: No new labs needed   If you have labs (blood work) drawn today and your tests are completely normal, you will receive your results only by: Marland Kitchen MyChart Message (if you have MyChart) OR . A paper copy in the mail If you have any lab test that is abnormal or we need to change your treatment, we will call you to review the results.   Testing/Procedures: No new testing needed   Follow-Up: At [facility], you and your health needs are our priority.  As part of our continuing mission to provide you with exceptional heart care, we have created designated Provider Care Teams.  These Care Teams include your primary Cardiologist (physician) and Advanced Practice Providers (APPs -  Physician Assistants and Nurse Practitioners) who all work together to provide you with the care you need, when you need it.  . You will need a follow up appointment in 12 months   . Providers on your designated Care Team:   . Murray Hodgkins, NP . Christell Faith, PA-C . Marrianne Mood, PA-C  Any Other Special Instructions Will Be Listed Below (If Applicable).  For educational health videos Log in to : [email or web address] Or : [email or web address], password : [email or web address]

## 2019-08-23 DIAGNOSIS — Z7901 Long term (current) use of anticoagulants: Secondary | ICD-10-CM | POA: Diagnosis not present

## 2019-08-28 DIAGNOSIS — C44619 Basal cell carcinoma of skin of left upper limb, including shoulder: Secondary | ICD-10-CM | POA: Diagnosis not present

## 2019-08-30 DIAGNOSIS — Z7901 Long term (current) use of anticoagulants: Secondary | ICD-10-CM | POA: Diagnosis not present

## 2019-09-13 DIAGNOSIS — Z7901 Long term (current) use of anticoagulants: Secondary | ICD-10-CM | POA: Diagnosis not present

## 2019-09-24 ENCOUNTER — Other Ambulatory Visit: Payer: Self-pay | Admitting: Primary Care

## 2019-09-24 DIAGNOSIS — I10 Essential (primary) hypertension: Secondary | ICD-10-CM

## 2019-10-04 ENCOUNTER — Other Ambulatory Visit: Payer: Self-pay | Admitting: Primary Care

## 2019-10-04 DIAGNOSIS — I10 Essential (primary) hypertension: Secondary | ICD-10-CM

## 2019-10-05 ENCOUNTER — Ambulatory Visit (INDEPENDENT_AMBULATORY_CARE_PROVIDER_SITE_OTHER): Payer: PPO

## 2019-10-05 ENCOUNTER — Other Ambulatory Visit: Payer: Self-pay

## 2019-10-05 DIAGNOSIS — R0600 Dyspnea, unspecified: Secondary | ICD-10-CM

## 2019-10-12 ENCOUNTER — Other Ambulatory Visit: Payer: Self-pay | Admitting: Primary Care

## 2019-10-12 DIAGNOSIS — Z7901 Long term (current) use of anticoagulants: Secondary | ICD-10-CM

## 2019-10-12 DIAGNOSIS — I4891 Unspecified atrial fibrillation: Secondary | ICD-10-CM

## 2019-10-12 NOTE — Telephone Encounter (Signed)
Pt has changed PCP and now is seeing Dr. Kary Kos. Refill refused with note to pharmacy that pt is no long a pt here.

## 2019-10-17 DIAGNOSIS — F411 Generalized anxiety disorder: Secondary | ICD-10-CM | POA: Diagnosis not present

## 2019-10-17 DIAGNOSIS — F5105 Insomnia due to other mental disorder: Secondary | ICD-10-CM | POA: Diagnosis not present

## 2019-10-17 DIAGNOSIS — F330 Major depressive disorder, recurrent, mild: Secondary | ICD-10-CM | POA: Diagnosis not present

## 2019-10-18 DIAGNOSIS — E785 Hyperlipidemia, unspecified: Secondary | ICD-10-CM | POA: Diagnosis not present

## 2019-10-18 DIAGNOSIS — M7989 Other specified soft tissue disorders: Secondary | ICD-10-CM | POA: Diagnosis not present

## 2019-10-18 DIAGNOSIS — I10 Essential (primary) hypertension: Secondary | ICD-10-CM | POA: Diagnosis not present

## 2019-10-18 DIAGNOSIS — Z23 Encounter for immunization: Secondary | ICD-10-CM | POA: Diagnosis not present

## 2019-10-18 DIAGNOSIS — G609 Hereditary and idiopathic neuropathy, unspecified: Secondary | ICD-10-CM | POA: Diagnosis not present

## 2019-10-18 DIAGNOSIS — Z7901 Long term (current) use of anticoagulants: Secondary | ICD-10-CM | POA: Diagnosis not present

## 2019-10-20 DIAGNOSIS — E785 Hyperlipidemia, unspecified: Secondary | ICD-10-CM | POA: Diagnosis not present

## 2019-10-20 DIAGNOSIS — G609 Hereditary and idiopathic neuropathy, unspecified: Secondary | ICD-10-CM | POA: Diagnosis not present

## 2019-11-07 DIAGNOSIS — Z7901 Long term (current) use of anticoagulants: Secondary | ICD-10-CM | POA: Diagnosis not present

## 2019-11-07 DIAGNOSIS — F411 Generalized anxiety disorder: Secondary | ICD-10-CM | POA: Diagnosis not present

## 2019-11-07 DIAGNOSIS — F330 Major depressive disorder, recurrent, mild: Secondary | ICD-10-CM | POA: Diagnosis not present

## 2019-11-07 DIAGNOSIS — F5105 Insomnia due to other mental disorder: Secondary | ICD-10-CM | POA: Diagnosis not present

## 2019-11-09 ENCOUNTER — Telehealth: Payer: Self-pay

## 2019-11-09 NOTE — Telephone Encounter (Signed)
Tried to contact pt again but no answer and VM full.

## 2019-11-09 NOTE — Telephone Encounter (Signed)
Pt left VM requesting call back.  Tried to contact pt but no answer and VM is full.

## 2019-11-10 NOTE — Telephone Encounter (Signed)
Tried to contact pt again but no answer and no VM ?

## 2019-11-10 NOTE — Telephone Encounter (Signed)
Tried to contact pt again but still no answer and VM is full.

## 2019-11-17 NOTE — Telephone Encounter (Signed)
Tried to contact pt again but no answer and no VM. Sent letter.

## 2019-11-23 DIAGNOSIS — Z7901 Long term (current) use of anticoagulants: Secondary | ICD-10-CM | POA: Diagnosis not present

## 2019-11-27 ENCOUNTER — Telehealth: Payer: Self-pay

## 2019-11-27 ENCOUNTER — Other Ambulatory Visit: Payer: Self-pay

## 2019-11-27 DIAGNOSIS — Z7901 Long term (current) use of anticoagulants: Secondary | ICD-10-CM

## 2019-11-27 DIAGNOSIS — R002 Palpitations: Secondary | ICD-10-CM

## 2019-11-27 NOTE — Telephone Encounter (Signed)
Received MyChart message from pt regarding his INR check. Called him clarify what he needed. He stated that he would like to start having his INR checked in our office instead of going to his PCP. Reviewed his previous schedule and make him appt w/ me in 4 weeks. He is appreciative and will call back w/ any further questions or concerns.

## 2019-12-03 NOTE — Telephone Encounter (Signed)
If he fluid and salt comes in,  then this needs to be urinated out Such as bag of chips etc. He is certainly welcome to take Lasix 10 to 20 mg as needed with a potassium Certainly if he has no leg swelling or no abdominal bloating or signs of fluid retention he may not need to take a water pill on a regular basis

## 2019-12-10 ENCOUNTER — Other Ambulatory Visit: Payer: Self-pay | Admitting: Primary Care

## 2019-12-10 DIAGNOSIS — I10 Essential (primary) hypertension: Secondary | ICD-10-CM

## 2019-12-12 ENCOUNTER — Other Ambulatory Visit: Payer: Self-pay | Admitting: Primary Care

## 2019-12-12 DIAGNOSIS — I10 Essential (primary) hypertension: Secondary | ICD-10-CM

## 2019-12-17 NOTE — Telephone Encounter (Signed)
Several options available First would be coming to the office to discuss, get EKG to confirm atrial fibrillation, might be able to increase some of his medications beta-blocker or amiodarone were both to get him feeling better  Secondary would be atrial fibrillation clinic but would like EKG locally to confirm he has atrial fibrillation first before sending him to [facility]  For dizziness would check his blood pressure when he stands up

## 2019-12-18 ENCOUNTER — Telehealth: Payer: Self-pay | Admitting: Cardiovascular Disease

## 2019-12-18 NOTE — Telephone Encounter (Signed)
Patient is requesting to switch providers from Dr. Rockey Situ to Dr. Acie Fredrickson. Please advise.

## 2019-12-18 NOTE — Telephone Encounter (Signed)
I am now part time ( and therefore have limited time in the office ) and am not taking transfers unless it is patient who I have had an established relationship in the past or  a family member . We have numerous new physicians who started last year and 2 more staring in the next month who are better able to provide consistent care.

## 2019-12-25 ENCOUNTER — Other Ambulatory Visit: Payer: Self-pay

## 2019-12-25 ENCOUNTER — Ambulatory Visit (INDEPENDENT_AMBULATORY_CARE_PROVIDER_SITE_OTHER): Payer: PPO

## 2019-12-25 DIAGNOSIS — Z5181 Encounter for therapeutic drug level monitoring: Secondary | ICD-10-CM | POA: Diagnosis not present

## 2019-12-25 DIAGNOSIS — Z7901 Long term (current) use of anticoagulants: Secondary | ICD-10-CM

## 2019-12-25 DIAGNOSIS — R002 Palpitations: Secondary | ICD-10-CM | POA: Diagnosis not present

## 2019-12-25 LAB — POCT INR: INR: 2.4 (ref 2.0–3.0)

## 2019-12-25 NOTE — Patient Instructions (Signed)
-   continue dosage of warfarin 7.5 mg - 1/2 tablet every day EXCEPT whole tablet on Sundays & Thursdays. - Recheck INR in 6 weeks

## 2020-01-01 ENCOUNTER — Other Ambulatory Visit: Payer: Self-pay | Admitting: Primary Care

## 2020-01-01 ENCOUNTER — Other Ambulatory Visit: Payer: Self-pay | Admitting: Gastroenterology

## 2020-01-01 DIAGNOSIS — K219 Gastro-esophageal reflux disease without esophagitis: Secondary | ICD-10-CM

## 2020-01-01 DIAGNOSIS — I10 Essential (primary) hypertension: Secondary | ICD-10-CM

## 2020-01-01 NOTE — Telephone Encounter (Signed)
May refill until time of annual follow-up. Thank you.

## 2020-01-02 DIAGNOSIS — C4361 Malignant melanoma of right upper limb, including shoulder: Secondary | ICD-10-CM | POA: Diagnosis not present

## 2020-01-02 DIAGNOSIS — C44712 Basal cell carcinoma of skin of right lower limb, including hip: Secondary | ICD-10-CM | POA: Diagnosis not present

## 2020-01-02 DIAGNOSIS — D2272 Melanocytic nevi of left lower limb, including hip: Secondary | ICD-10-CM | POA: Diagnosis not present

## 2020-01-02 DIAGNOSIS — D225 Melanocytic nevi of trunk: Secondary | ICD-10-CM | POA: Diagnosis not present

## 2020-01-02 DIAGNOSIS — X32XXXA Exposure to sunlight, initial encounter: Secondary | ICD-10-CM | POA: Diagnosis not present

## 2020-01-02 DIAGNOSIS — D485 Neoplasm of uncertain behavior of skin: Secondary | ICD-10-CM | POA: Diagnosis not present

## 2020-01-02 DIAGNOSIS — D2262 Melanocytic nevi of left upper limb, including shoulder: Secondary | ICD-10-CM | POA: Diagnosis not present

## 2020-01-02 DIAGNOSIS — D2271 Melanocytic nevi of right lower limb, including hip: Secondary | ICD-10-CM | POA: Diagnosis not present

## 2020-01-02 DIAGNOSIS — Z85820 Personal history of malignant melanoma of skin: Secondary | ICD-10-CM | POA: Diagnosis not present

## 2020-01-02 DIAGNOSIS — L570 Actinic keratosis: Secondary | ICD-10-CM | POA: Diagnosis not present

## 2020-01-02 DIAGNOSIS — Z85828 Personal history of other malignant neoplasm of skin: Secondary | ICD-10-CM | POA: Diagnosis not present

## 2020-01-18 ENCOUNTER — Encounter: Payer: Self-pay | Admitting: Physician Assistant

## 2020-01-18 ENCOUNTER — Ambulatory Visit: Payer: PPO | Admitting: Physician Assistant

## 2020-01-18 ENCOUNTER — Other Ambulatory Visit: Payer: Self-pay

## 2020-01-18 VITALS — BP 140/70 | HR 61 | Ht 72.0 in | Wt 189.5 lb

## 2020-01-18 DIAGNOSIS — R5382 Chronic fatigue, unspecified: Secondary | ICD-10-CM | POA: Diagnosis not present

## 2020-01-18 DIAGNOSIS — E785 Hyperlipidemia, unspecified: Secondary | ICD-10-CM | POA: Diagnosis not present

## 2020-01-18 DIAGNOSIS — R0600 Dyspnea, unspecified: Secondary | ICD-10-CM | POA: Diagnosis not present

## 2020-01-18 DIAGNOSIS — R531 Weakness: Secondary | ICD-10-CM

## 2020-01-18 DIAGNOSIS — Z8673 Personal history of transient ischemic attack (TIA), and cerebral infarction without residual deficits: Secondary | ICD-10-CM | POA: Diagnosis not present

## 2020-01-18 DIAGNOSIS — R0609 Other forms of dyspnea: Secondary | ICD-10-CM

## 2020-01-18 DIAGNOSIS — R002 Palpitations: Secondary | ICD-10-CM

## 2020-01-18 DIAGNOSIS — I25118 Atherosclerotic heart disease of native coronary artery with other forms of angina pectoris: Secondary | ICD-10-CM | POA: Diagnosis not present

## 2020-01-18 DIAGNOSIS — I10 Essential (primary) hypertension: Secondary | ICD-10-CM | POA: Diagnosis not present

## 2020-01-18 DIAGNOSIS — I480 Paroxysmal atrial fibrillation: Secondary | ICD-10-CM

## 2020-01-18 NOTE — Patient Instructions (Signed)
Medication Instructions:  1- For 3 days only- Lasix Take 20 mg by mouth once daily, then resume normal dose as needed.  2- For 3 days only- K clor Take 10 mEq by mouth once daily, then resume normal dose as needed.  *If you need a refill on your cardiac medications before your next appointment, please call your pharmacy*   Lab Work: 1- Your physician recommends that you return for lab work on Tuesday August 17 at the medical mall. (CBC, BMET) No appt is needed. Hours are M-F 7AM- 6 PM.  2- CV19 Pre admit testing DRIVE THRU  Please report to the PAT testing site ([facility] arts [facility]) on ____Tuesday August 17 _____ date ______8-12 AM_____ time for your DRIVE THRU covid testing that is required prior to your procedure.  Following covid testing, please remain in quarantine. If you must be around others, please wash hands, avoid touching face and wear your mask.    If you have labs (blood work) drawn today and your tests are completely normal, you will receive your results only by: Marland Kitchen MyChart Message (if you have MyChart) OR . A paper copy in the mail If you have any lab test that is abnormal or we need to change your treatment, we will call you to review the results.   Testing/Procedures:    [address], [place] [address] Dept: [phone] Loc: [phone]  [facility] [place]  01/18/2020  You are scheduled for a Cardiac Catheterization on Thursday, August 19 with Dr. Kathlyn Sacramento.  1. Please arrive at the medical mall of [facility] at 8:30 AM (This time is 1 HR before your procedure to ensure your preparation). Free valet parking service is available.   Special note: Every effort is made to have your procedure done on time. Please understand that emergencies sometimes delay scheduled procedures.  2. Diet: Do not eat solid foods after midnight.  The patient may have clear  liquids until 5am upon the day of the procedure.  3. Labs: as advised 4. Medication instructions in preparation for your procedure: Stop taking Coumadin (Warfarin) on Saturday, August 14. The Coumadin nurse will contact you for further information.    On the morning of your procedure, take your Aspirin and any morning medicines NOT listed above.  You may use sips of water.  5. Plan for one night stay--bring personal belongings. 6. Bring a current list of your medications and current insurance cards. 7. You MUST have a responsible person to drive you home. 8. Someone MUST be with you the first 24 hours after you arrive home or your discharge will be delayed. 9. Please wear clothes that are easy to get on and off and wear slip-on shoes.  Thank you for allowing [place] to care for you!   -- [facility]    Follow-Up: At [facility], you and your health needs are our priority.  As part of our continuing mission to provide you with exceptional heart care, we have created designated Provider Care Teams.  These Care Teams include your primary Cardiologist (physician) and Advanced Practice Providers (APPs -  Physician Assistants and Nurse Practitioners) who all work together to provide you with the care you need, when you need it.  We recommend signing up for the patient portal called "MyChart".  Sign up information is provided on this After Visit Summary.  MyChart is used to connect with patients for Virtual  Visits (Telemedicine).  Patients are able to view lab/test results, encounter notes, upcoming appointments, etc.  Non-urgent messages can be sent to your provider as well.   To learn more about what you can do with MyChart, go to [email or web address].    Your next appointment:   3 week(s)  The format for your next appointment:   In Person  Provider:     You may see Ida Rogue, MD or Marrianne Mood, PA-C

## 2020-01-18 NOTE — H&P (View-Only) (Signed)
Office Visit    Patient Name: [name] Date of Encounter: 01/18/2020  Primary Care Provider:  [name], MD Primary Cardiologist:  Ida Rogue, MD  Chief Complaint    Chief Complaint  Patient presents with  . office visit    Patient reports having a lack of energy, being slightly SOB, and having indigestion/palpitations for the last two days. He also reports facial flushing and numbness in his jaw. Meds verbally reviewed with patient.    74 year old male with history of CAD s/p PCI to the left circumflex 2007, PAF on amiodarone and Coumadin, stroke treated with TPA and residual left-sided weakness, reported mini stroke 2015 on PET scan without symptoms and on Coumadin at the time, alcohol use with 3 to 5 glasses of wine per night, poor sleep with obstructive sleep apnea and central sleep apnea not on CPAP, prior retinal tear, hypertension, IBS, GERD, and who presents for follow-up with lack of energy and shortness of breath as well as symptoms as below.  Past Medical History    Past Medical History:  Diagnosis Date  . BPH (benign prostatic hyperplasia)   . Coronary artery disease    a. s/p PCI to LCx in 2007; b. MV 2017 no sig ischemia, EF 59%, nl study  . Depression   . GERD (gastroesophageal reflux disease)   . Hyperlipidemia   . Hypertension   . IBS (irritable bowel syndrome)   . Myocardial infarction ([place])    12-13 yrs ago   . PAF (paroxysmal atrial fibrillation) ([place])    a. CHADS2VASc => 5 (HTN, age x 1, stroke x 2, vascular disease); b. on Coumadin   . PNA (pneumonia)   . Skin cancer    face   . Sleep apnea    off cpap   . Stroke [facility])    a. x 2 with residual left-sided weakness   Past Surgical History:  Procedure Laterality Date  . ADENOIDECTOMY  1951  . CARDIAC CATHETERIZATION  2006   X1 STENT  . COLONOSCOPY    . POLYPECTOMY    . SKIN CANCER EXCISION N/A 08/2016   Forehead.  [facility] (Dr. Deliah Boston)  . THYROIDECTOMY,  PARTIAL    . UPPER GASTROINTESTINAL ENDOSCOPY      Allergies  Allergies  Allergen Reactions  . Bactrim [Sulfamethoxazole-Trimethoprim] Anaphylaxis    Ulcers  . Crestor [Rosuvastatin Calcium]     Severe Myalgias    History of Present Illness    [name] is a 74 y.o. male with PMH as above.   He underwent ischemic evaluation 06/2015 with stress test showing no significant ischemia and EF 55 to 65%.  Holter monitoring 12/2015 showed NSR with rare PACs and PVCs, less than 1%.  Longest atrial run was 6 beats.  Repeat cardiac monitoring 01/2017 showed NSR with average rate 69 bpm.  PAF represented 6% burden with rates 48bpm to 171 bpm, average rate 84 bpm.  The longest episode was 17 hours and 41 minutes with an average rate of 84 bpm.  34 SVT runs occurred.  Isolated ectopy was rare.  He was seen in the ED 07/2018 with ataxia and some associated left side weakness increased from baseline.  Wife was concerned also for vision changes, denied by patient.  CT without acute findings.  MRI without acute findings and small focus of chronic hemorrhage of left cerebellum, chronic ischemic microangiopathy, and generalized atrophy.  EKG showed sinus bradycardia.  In the ED, he was able to walk  at his baseline.  Recommendation was to follow-up with neurology.  He was last seen in clinic 08/18/2019.  He reported shortness of breath.  He also noted ongoing myalgias for 1 to 2 years with etiology unclear and possibly related to neuropathy.  It was noted he was previously seen by neurology with history of falls.  He reported ongoing chronic fatigue.  He reported a recent episode of shortness of breath, taking 5 minutes to recover.  He is not on Repatha due to cost.  He ran out of pravastatin.  He reported a recent episode of fainting in the shower.  EKG NSR without ST/T wave changes.  Today, 01/18/2020, he presents to clinic and reports that he has not been feeling well for the last few weeks with progressive  fatigue and episodes of lightheadedness, tachypalpitations, diaphoresis, indigestion, SOB/DOE, and jaw tightness over the last few weeks. 1 week ago he had an episode of dizziness, weakness, and nausea/indigestion. He is unable to recall if at exertion or rest. 2 days ago, he had another episode at rest /while sitting that lasted 5 minutes and included diaphoresis.  His most recent episode occurred yesterday, 8/11, during which time he also noted a tight jaw in addition to the above sx and that lasted several hours before self resolving. He did not take his SL nitro.  He reports that these symptoms are concerning and similar to those felt prior to his previous PCI/DES/ intervention in 2007.  He reports at least 4 episodes as above within the past 2 weeks that are sporadic with unclear triggers.  He also notes increased frequency of racing heart rate and skipped heartbeats.  In addition, his wife has noticed lower extremity edema. 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  He reports concern given his symptoms are similar to that before his 2007 intervention.  In addition, he is concerned given his family history, including a father with heart attack at 79yo and brother with defibrillator.  He is monitoring his blood pressure at home with SBP 120s -140s and DBP 70s-90s.  He reports staying well-hydrated with 3 to 4 glasses of 12 ounce water per day.  He is drinking 1 to 2 cups of coffee per day.  He drinks 1 to 2 glasses of wine every couple of days.  He has not resumed smoking, quitting 15 years earlier.  He is not wearing his CPAP with risks of ongoing untreated sleep apnea reviewed in detail.  He reports that he is not taking his Lasix and potassium due to needing to urinate too often.  We reviewed the main effect of diuretics and importance of medication compliance.  He is taking his pravastatin.  Home  Medications    Prior to Admission medications   Medication Sig Start Date End Date Taking? Authorizing Provider  amiodarone (PACERONE) 100 MG tablet Take 1 tablet (100 mg total) by mouth daily. 08/04/18  Yes Dunn, Ryan M, PA-C  amLODipine (NORVASC) 5 MG tablet TAKE 1 TABLET BY MOUTH EVERY DAY FOR BLOOD PRESSURE 06/12/19  Yes Pleas Koch, NP  aspirin 81 MG tablet Take 81 mg by mouth daily.   Yes [provider]  bisoprolol (ZEBETA) 10 MG tablet TAKE 1 AND 1/2 TABLETS BY MOUTH EVERY DAY FOR BLOOD PRESSURE. 10/04/19  Yes Pleas Koch, NP  Cholecalciferol (VITAMIN D3) 50 MCG (2000 UT) TABS Take 1 tablet by  mouth daily with supper.   Yes [provider]  DULoxetine (CYMBALTA) 20 MG capsule Take 40 mg by mouth daily.    Yes [provider]  hydrochlorothiazide (HYDRODIURIL) 12.5 MG tablet Take 1 tablet (12.5 mg total) by mouth daily. For blood pressure. 07/14/19  Yes Pleas Koch, NP  lisinopril (ZESTRIL) 40 MG tablet TAKE 1 TABLET (40 MG TOTAL) BY MOUTH DAILY. FOR BLOOD PRESSURE. 09/25/19  Yes Pleas Koch, NP  nitroGLYCERIN (NITROSTAT) 0.4 MG SL tablet Place 1 tablet (0.4 mg total) every 5 (five) minutes as needed under the tongue for chest pain. 04/16/17  Yes Leone Haven, MD  omeprazole (PRILOSEC) 40 MG capsule TAKE 1 CAPSULE (40 MG TOTAL) BY MOUTH 2 (TWO) TIMES DAILY BEFORE A MEAL. FOR HEARTBURN. 01/01/20  Yes Thornton Park, MD  pravastatin (PRAVACHOL) 40 MG tablet Take 1 tablet (40 mg total) by mouth daily. For cholesterol. 07/14/19  Yes Pleas Koch, NP  triamcinolone cream (KENALOG) 0.1 % Apply 1 application topically 2 (two) times daily. 01/19/19  Yes Pleas Koch, NP  vitamin B-12 (CYANOCOBALAMIN) 1000 MCG tablet Take 1,000 mcg by mouth daily.   Yes [provider]  warfarin (COUMADIN) 7.5 MG tablet Take 0.5-1 tablets (3.75-7.5 mg total) by mouth See admin instructions. Take 7.5mg  on sundays, all other days take 3.75mg  or as  directed by coumadin clinic. 11/02/18  Yes Pleas Koch, NP  zaleplon (SONATA) 5 MG capsule Take 5 mg by mouth at bedtime as needed. 03/20/19  Yes [provider]    Review of Systems    He denies chest pain, pnd, orthopnea, v, , syncope, weight gain, or early satiety.  He reports progressive fatigue.  He reports tachypalpitations, DOE/SOB, dizziness, edema, diaphoresis, indigestion/nausea, and tight jaw.   All other systems reviewed and are otherwise negative except as noted above.  Physical Exam    VS:  BP 140/70 (BP Location: Left Arm, Patient Position: Sitting, Cuff Size: Normal)   Pulse 61   Ht 6' (1.829 m)   Wt 189 lb 8 oz (86 kg)   SpO2 97%   BMI 25.70 kg/m  , BMI There is no height or weight on file to calculate BMI. GEN: Well nourished, well developed, in no acute distress. HEENT: normal. Neck: Supple, no JVD, carotid bruits, or masses. Cardiac: RRR, no murmurs, rubs, or gallops. No clubbing, cyanosis, mild bilateral edema.  Radials/DP/PT 2+ and equal bilaterally.  Respiratory: Bibasilar crackles GI: Firm, distended, BS + x 4. MS: no deformity or atrophy. Skin: warm and dry, no rash. Neuro:  Strength and sensation are intact. Psych: Normal affect.  Accessory Clinical Findings    ECG personally reviewed by me today - SR with first degree AVB, 61bpm, PRi 210, QTc 430, Poor R wave in III and V1, baseline wander, nonspecific ST abnormality- no acute changes.  VITALS Reviewed today   Temp Readings from Last 3 Encounters:  07/14/19 (!) 96.3 F (35.7 C) (Temporal)  04/21/19 (!) 96.9 F (36.1 C) (Temporal)  01/09/19 98.2 F (36.8 C)   BP Readings from Last 3 Encounters:  08/18/19 110/80  07/14/19 126/76  04/21/19 120/82   Pulse Readings from Last 3 Encounters:  08/18/19 61  07/14/19 68  04/21/19 64    Wt Readings from Last 3 Encounters:  08/18/19 187 lb 9.6 oz (85.1 kg)  07/14/19 194 lb 8 oz (88.2 kg)  04/21/19 186 lb 4 oz (84.5 kg)     LABS   reviewed today  Lab Results  Component Value Date   WBC 6.1 07/14/2019   HGB 13.8 07/14/2019   HCT 40.6 07/14/2019   MCV 90.6 07/14/2019   PLT 225 07/14/2019   Lab Results  Component Value Date   CREATININE 1.07 07/14/2019   BUN 14 07/14/2019   NA 143 07/14/2019   K 4.6 07/14/2019   CL 106 07/14/2019   CO2 24 07/14/2019   Lab Results  Component Value Date   ALT 21 07/14/2019   AST 24 07/14/2019   ALKPHOS 82 08/02/2018   BILITOT 0.5 07/14/2019   Lab Results  Component Value Date   CHOL 148 07/14/2019   HDL 54 07/14/2019   LDLCALC 67 07/14/2019   TRIG 206 (H) 07/14/2019   CHOLHDL 2.7 07/14/2019    Lab Results  Component Value Date   HGBA1C 5.5 05/27/2018   Lab Results  Component Value Date   TSH 1.52 12/24/2017     STUDIES/PROCEDURES reviewed today   Echo 10/05/2019 1. Left ventricular ejection fraction, by estimation, is 60 to 65%. The  left ventricle has normal function. The left ventricle has no regional  wall motion abnormalities. Left ventricular diastolic parameters are  consistent with Grade I diastolic  dysfunction (impaired relaxation).  2. Right ventricular systolic function is normal. The right ventricular  size is normal. There is mildly elevated pulmonary artery systolic  pressure. The estimated right ventricular systolic pressure is 67.6 mmHg.  3. Left atrial size was mildly dilated.  4. Right atrial size was mildly dilated.   Assessment & Plan   CAD s/p PCI to the left circumflex 2007 --No current chest pain but denies CP before his previous PCI. Reports symptoms are consistent with those experienced prior to his 2007 intervention.  Symptoms are reportedly progressive and increasing in severity, occurring with exertion and at rest.  Concern noted by pt given similarity of sx and his risk factors for cardiac ischemia, including history of smoking and family history. Recent echo as above. Volume up on exam with recommendations as below to  increase diuresis for three days. Reviewed all options for further ischemic work-up with patient preference to proceed straight to cardiac catheterization over that of stress testing. Risks and benefits of cardiac catheterization have been discussed with the patient.  These include bleeding, infection, kidney damage, stroke, heart attack, death.  The patient understands these risks and is willing to proceed. Will schedule for [facility]. Given he is on warfarin and will need a Lovenox bridge, staff message sent to Coumadin clinic for instructions regarding holding Warfarin and Lovenox bridge. Obtain BMET, CBC, COVID-19 testing.   Acute on chronic HFpEF Pulmonary HTN --SOB, DOE, and sx as above. Volume overloaded on exam. Previous echo as above with EF 60-65% and G1DD. PASP 37.19mmHg. Mild L/RAE. Avoids regular diuresis /noncompliant with diuresis given it increases his frequency of urination. Discussed this was the desired effect of the medication. Recommended he take his lasix consistently. Will have him take lasix 20mg  for x3 days given increased volume status and then drop down to his usual dosing of lasix on PRN basis. Will obtain BMET today and given upcoming R/LHC.   PAF on Warfarin  --NSR today. Controlled rate. Contacted Coumadin clinic regarding  Lovenox bridge / Warfarin management with upcoming cath.  He follows with the Coumadin clinic with last INR 2.4.  Cyanosis of bilateral lower extremiteis --Noted bilateral lower extremity cyanosis on exam today.  Concern for PAD with pt reporting minimal sx and difficult to tease out  given his residual sx from his CVA. Recommend lower extremity arterial study/ABIs in the near future.  Will defer for now given relatively asx with preference to obtain catheterization first at this time.  History of CVA, ataxia --ASA and Coumadin. Followed by Coumadin clinic and neurology. Residual L sided weakness. Recommend ongoing aggressive risk factor modification.    Hypertension --BP elevated today in setting of volume overload. Continue to monitor at home as will likely improve with improved volume status. He will call if it continues to be elevated with goal BP <130/80.  HLD, goal LDL <70 --Continue pravastatin. LDL controlled.   Sleep apnea not on CPAP --Reviewed risks associated with untreated sleep apnea. He does not wish to try out masks again or look into the latest advances in CPAP technology at this time. Consider as contributing to his fatigue and cardiac health as above.    Medication changes: lasix 20mg  x3 days Labs ordered: BMET, CBC, COVID-19 swab Studies / Imaging ordered: [facility] Future considerations: LE arterial studies / ABIs Disposition: RTC after [facility]   Arvil Chaco, PA-C 01/18/2020

## 2020-01-18 NOTE — Progress Notes (Signed)
Office Visit    Patient Name: [name] Date of Encounter: 01/18/2020  Primary Care Provider:  [name], MD Primary Cardiologist:  Ida Rogue, MD  Chief Complaint    Chief Complaint  Patient presents with  . office visit    Patient reports having a lack of energy, being slightly SOB, and having indigestion/palpitations for the last two days. He also reports facial flushing and numbness in his jaw. Meds verbally reviewed with patient.    74 year old male with history of CAD s/p PCI to the left circumflex 2007, PAF on amiodarone and Coumadin, stroke treated with TPA and residual left-sided weakness, reported mini stroke 2015 on PET scan without symptoms and on Coumadin at the time, alcohol use with 3 to 5 glasses of wine per night, poor sleep with obstructive sleep apnea and central sleep apnea not on CPAP, prior retinal tear, hypertension, IBS, GERD, and who presents for follow-up with lack of energy and shortness of breath as well as symptoms as below.  Past Medical History    Past Medical History:  Diagnosis Date  . BPH (benign prostatic hyperplasia)   . Coronary artery disease    a. s/p PCI to LCx in 2007; b. MV 2017 no sig ischemia, EF 59%, nl study  . Depression   . GERD (gastroesophageal reflux disease)   . Hyperlipidemia   . Hypertension   . IBS (irritable bowel syndrome)   . Myocardial infarction ([place])    12-13 yrs ago   . PAF (paroxysmal atrial fibrillation) ([place])    a. CHADS2VASc => 5 (HTN, age x 1, stroke x 2, vascular disease); b. on Coumadin   . PNA (pneumonia)   . Skin cancer    face   . Sleep apnea    off cpap   . Stroke [facility])    a. x 2 with residual left-sided weakness   Past Surgical History:  Procedure Laterality Date  . ADENOIDECTOMY  1951  . CARDIAC CATHETERIZATION  2006   X1 STENT  . COLONOSCOPY    . POLYPECTOMY    . SKIN CANCER EXCISION N/A 08/2016   Forehead.  [facility] (Dr. Deliah Boston)  . THYROIDECTOMY,  PARTIAL    . UPPER GASTROINTESTINAL ENDOSCOPY      Allergies  Allergies  Allergen Reactions  . Bactrim [Sulfamethoxazole-Trimethoprim] Anaphylaxis    Ulcers  . Crestor [Rosuvastatin Calcium]     Severe Myalgias    History of Present Illness    [name] is a 74 y.o. male with PMH as above.   He underwent ischemic evaluation 06/2015 with stress test showing no significant ischemia and EF 55 to 65%.  Holter monitoring 12/2015 showed NSR with rare PACs and PVCs, less than 1%.  Longest atrial run was 6 beats.  Repeat cardiac monitoring 01/2017 showed NSR with average rate 69 bpm.  PAF represented 6% burden with rates 48bpm to 171 bpm, average rate 84 bpm.  The longest episode was 17 hours and 41 minutes with an average rate of 84 bpm.  34 SVT runs occurred.  Isolated ectopy was rare.  He was seen in the ED 07/2018 with ataxia and some associated left side weakness increased from baseline.  Wife was concerned also for vision changes, denied by patient.  CT without acute findings.  MRI without acute findings and small focus of chronic hemorrhage of left cerebellum, chronic ischemic microangiopathy, and generalized atrophy.  EKG showed sinus bradycardia.  In the ED, he was able to walk  at his baseline.  Recommendation was to follow-up with neurology.  He was last seen in clinic 08/18/2019.  He reported shortness of breath.  He also noted ongoing myalgias for 1 to 2 years with etiology unclear and possibly related to neuropathy.  It was noted he was previously seen by neurology with history of falls.  He reported ongoing chronic fatigue.  He reported a recent episode of shortness of breath, taking 5 minutes to recover.  He is not on Repatha due to cost.  He ran out of pravastatin.  He reported a recent episode of fainting in the shower.  EKG NSR without ST/T wave changes.  Today, 01/18/2020, he presents to clinic and reports that he has not been feeling well for the last few weeks with progressive  fatigue and episodes of lightheadedness, tachypalpitations, diaphoresis, indigestion, SOB/DOE, and jaw tightness over the last few weeks. 1 week ago he had an episode of dizziness, weakness, and nausea/indigestion. He is unable to recall if at exertion or rest. 2 days ago, he had another episode at rest /while sitting that lasted 5 minutes and included diaphoresis.  His most recent episode occurred yesterday, 8/11, during which time he also noted a tight jaw in addition to the above sx and that lasted several hours before self resolving. He did not take his SL nitro.  He reports that these symptoms are concerning and similar to those felt prior to his previous PCI/DES/ intervention in 2007.  He reports at least 4 episodes as above within the past 2 weeks that are sporadic with unclear triggers.  He also notes increased frequency of racing heart rate and skipped heartbeats.  In addition, his wife has noticed lower extremity edema. 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  He reports concern given his symptoms are similar to that before his 2007 intervention.  In addition, he is concerned given his family history, including a father with heart attack at 77yo and brother with defibrillator.  He is monitoring his blood pressure at home with SBP 120s -140s and DBP 70s-90s.  He reports staying well-hydrated with 3 to 4 glasses of 12 ounce water per day.  He is drinking 1 to 2 cups of coffee per day.  He drinks 1 to 2 glasses of wine every couple of days.  He has not resumed smoking, quitting 15 years earlier.  He is not wearing his CPAP with risks of ongoing untreated sleep apnea reviewed in detail.  He reports that he is not taking his Lasix and potassium due to needing to urinate too often.  We reviewed the main effect of diuretics and importance of medication compliance.  He is taking his pravastatin.  Home  Medications    Prior to Admission medications   Medication Sig Start Date End Date Taking? Authorizing Provider  amiodarone (PACERONE) 100 MG tablet Take 1 tablet (100 mg total) by mouth daily. 08/04/18  Yes Dunn, Ryan M, PA-C  amLODipine (NORVASC) 5 MG tablet TAKE 1 TABLET BY MOUTH EVERY DAY FOR BLOOD PRESSURE 06/12/19  Yes Pleas Koch, NP  aspirin 81 MG tablet Take 81 mg by mouth daily.   Yes [provider]  bisoprolol (ZEBETA) 10 MG tablet TAKE 1 AND 1/2 TABLETS BY MOUTH EVERY DAY FOR BLOOD PRESSURE. 10/04/19  Yes Pleas Koch, NP  Cholecalciferol (VITAMIN D3) 50 MCG (2000 UT) TABS Take 1 tablet by  mouth daily with supper.   Yes [provider]  DULoxetine (CYMBALTA) 20 MG capsule Take 40 mg by mouth daily.    Yes [provider]  hydrochlorothiazide (HYDRODIURIL) 12.5 MG tablet Take 1 tablet (12.5 mg total) by mouth daily. For blood pressure. 07/14/19  Yes Pleas Koch, NP  lisinopril (ZESTRIL) 40 MG tablet TAKE 1 TABLET (40 MG TOTAL) BY MOUTH DAILY. FOR BLOOD PRESSURE. 09/25/19  Yes Pleas Koch, NP  nitroGLYCERIN (NITROSTAT) 0.4 MG SL tablet Place 1 tablet (0.4 mg total) every 5 (five) minutes as needed under the tongue for chest pain. 04/16/17  Yes Leone Haven, MD  omeprazole (PRILOSEC) 40 MG capsule TAKE 1 CAPSULE (40 MG TOTAL) BY MOUTH 2 (TWO) TIMES DAILY BEFORE A MEAL. FOR HEARTBURN. 01/01/20  Yes Thornton Park, MD  pravastatin (PRAVACHOL) 40 MG tablet Take 1 tablet (40 mg total) by mouth daily. For cholesterol. 07/14/19  Yes Pleas Koch, NP  triamcinolone cream (KENALOG) 0.1 % Apply 1 application topically 2 (two) times daily. 01/19/19  Yes Pleas Koch, NP  vitamin B-12 (CYANOCOBALAMIN) 1000 MCG tablet Take 1,000 mcg by mouth daily.   Yes [provider]  warfarin (COUMADIN) 7.5 MG tablet Take 0.5-1 tablets (3.75-7.5 mg total) by mouth See admin instructions. Take 7.5mg  on sundays, all other days take 3.75mg  or as  directed by coumadin clinic. 11/02/18  Yes Pleas Koch, NP  zaleplon (SONATA) 5 MG capsule Take 5 mg by mouth at bedtime as needed. 03/20/19  Yes [provider]    Review of Systems    He denies chest pain, pnd, orthopnea, v, , syncope, weight gain, or early satiety.  He reports progressive fatigue.  He reports tachypalpitations, DOE/SOB, dizziness, edema, diaphoresis, indigestion/nausea, and tight jaw.   All other systems reviewed and are otherwise negative except as noted above.  Physical Exam    VS:  BP 140/70 (BP Location: Left Arm, Patient Position: Sitting, Cuff Size: Normal)   Pulse 61   Ht 6' (1.829 m)   Wt 189 lb 8 oz (86 kg)   SpO2 97%   BMI 25.70 kg/m  , BMI There is no height or weight on file to calculate BMI. GEN: Well nourished, well developed, in no acute distress. HEENT: normal. Neck: Supple, no JVD, carotid bruits, or masses. Cardiac: RRR, no murmurs, rubs, or gallops. No clubbing, cyanosis, mild bilateral edema.  Radials/DP/PT 2+ and equal bilaterally.  Respiratory: Bibasilar crackles GI: Firm, distended, BS + x 4. MS: no deformity or atrophy. Skin: warm and dry, no rash. Neuro:  Strength and sensation are intact. Psych: Normal affect.  Accessory Clinical Findings    ECG personally reviewed by me today - SR with first degree AVB, 61bpm, PRi 210, QTc 430, Poor R wave in III and V1, baseline wander, nonspecific ST abnormality- no acute changes.  VITALS Reviewed today   Temp Readings from Last 3 Encounters:  07/14/19 (!) 96.3 F (35.7 C) (Temporal)  04/21/19 (!) 96.9 F (36.1 C) (Temporal)  01/09/19 98.2 F (36.8 C)   BP Readings from Last 3 Encounters:  08/18/19 110/80  07/14/19 126/76  04/21/19 120/82   Pulse Readings from Last 3 Encounters:  08/18/19 61  07/14/19 68  04/21/19 64    Wt Readings from Last 3 Encounters:  08/18/19 187 lb 9.6 oz (85.1 kg)  07/14/19 194 lb 8 oz (88.2 kg)  04/21/19 186 lb 4 oz (84.5 kg)     LABS   reviewed today  Lab Results  Component Value Date   WBC 6.1 07/14/2019   HGB 13.8 07/14/2019   HCT 40.6 07/14/2019   MCV 90.6 07/14/2019   PLT 225 07/14/2019   Lab Results  Component Value Date   CREATININE 1.07 07/14/2019   BUN 14 07/14/2019   NA 143 07/14/2019   K 4.6 07/14/2019   CL 106 07/14/2019   CO2 24 07/14/2019   Lab Results  Component Value Date   ALT 21 07/14/2019   AST 24 07/14/2019   ALKPHOS 82 08/02/2018   BILITOT 0.5 07/14/2019   Lab Results  Component Value Date   CHOL 148 07/14/2019   HDL 54 07/14/2019   LDLCALC 67 07/14/2019   TRIG 206 (H) 07/14/2019   CHOLHDL 2.7 07/14/2019    Lab Results  Component Value Date   HGBA1C 5.5 05/27/2018   Lab Results  Component Value Date   TSH 1.52 12/24/2017     STUDIES/PROCEDURES reviewed today   Echo 10/05/2019 1. Left ventricular ejection fraction, by estimation, is 60 to 65%. The  left ventricle has normal function. The left ventricle has no regional  wall motion abnormalities. Left ventricular diastolic parameters are  consistent with Grade I diastolic  dysfunction (impaired relaxation).  2. Right ventricular systolic function is normal. The right ventricular  size is normal. There is mildly elevated pulmonary artery systolic  pressure. The estimated right ventricular systolic pressure is 99.8 mmHg.  3. Left atrial size was mildly dilated.  4. Right atrial size was mildly dilated.   Assessment & Plan   CAD s/p PCI to the left circumflex 2007 --No current chest pain but denies CP before his previous PCI. Reports symptoms are consistent with those experienced prior to his 2007 intervention.  Symptoms are reportedly progressive and increasing in severity, occurring with exertion and at rest.  Concern noted by pt given similarity of sx and his risk factors for cardiac ischemia, including history of smoking and family history. Recent echo as above. Volume up on exam with recommendations as below to  increase diuresis for three days. Reviewed all options for further ischemic work-up with patient preference to proceed straight to cardiac catheterization over that of stress testing. Risks and benefits of cardiac catheterization have been discussed with the patient.  These include bleeding, infection, kidney damage, stroke, heart attack, death.  The patient understands these risks and is willing to proceed. Will schedule for [facility]. Given he is on warfarin and will need a Lovenox bridge, staff message sent to Coumadin clinic for instructions regarding holding Warfarin and Lovenox bridge. Obtain BMET, CBC, COVID-19 testing.   Acute on chronic HFpEF Pulmonary HTN --SOB, DOE, and sx as above. Volume overloaded on exam. Previous echo as above with EF 60-65% and G1DD. PASP 37.78mmHg. Mild L/RAE. Avoids regular diuresis /noncompliant with diuresis given it increases his frequency of urination. Discussed this was the desired effect of the medication. Recommended he take his lasix consistently. Will have him take lasix 20mg  for x3 days given increased volume status and then drop down to his usual dosing of lasix on PRN basis. Will obtain BMET today and given upcoming R/LHC.   PAF on Warfarin  --NSR today. Controlled rate. Contacted Coumadin clinic regarding  Lovenox bridge / Warfarin management with upcoming cath.  He follows with the Coumadin clinic with last INR 2.4.  Cyanosis of bilateral lower extremiteis --Noted bilateral lower extremity cyanosis on exam today.  Concern for PAD with pt reporting minimal sx and difficult to tease out  given his residual sx from his CVA. Recommend lower extremity arterial study/ABIs in the near future.  Will defer for now given relatively asx with preference to obtain catheterization first at this time.  History of CVA, ataxia --ASA and Coumadin. Followed by Coumadin clinic and neurology. Residual L sided weakness. Recommend ongoing aggressive risk factor modification.    Hypertension --BP elevated today in setting of volume overload. Continue to monitor at home as will likely improve with improved volume status. He will call if it continues to be elevated with goal BP <130/80.  HLD, goal LDL <70 --Continue pravastatin. LDL controlled.   Sleep apnea not on CPAP --Reviewed risks associated with untreated sleep apnea. He does not wish to try out masks again or look into the latest advances in CPAP technology at this time. Consider as contributing to his fatigue and cardiac health as above.    Medication changes: lasix 20mg  x3 days Labs ordered: BMET, CBC, COVID-19 swab Studies / Imaging ordered: [facility] Future considerations: LE arterial studies / ABIs Disposition: RTC after LHC   Arvil Chaco, PA-C 01/18/2020

## 2020-01-22 ENCOUNTER — Other Ambulatory Visit
Admission: RE | Admit: 2020-01-22 | Discharge: 2020-01-22 | Disposition: A | Discharge status: Home / Self Care | Payer: PPO | Source: Ambulatory Visit | Attending: Physician Assistant | Admitting: Physician Assistant

## 2020-01-22 ENCOUNTER — Other Ambulatory Visit: Payer: Self-pay | Admitting: Physician Assistant

## 2020-01-22 ENCOUNTER — Other Ambulatory Visit: Payer: Self-pay

## 2020-01-22 ENCOUNTER — Ambulatory Visit (INDEPENDENT_AMBULATORY_CARE_PROVIDER_SITE_OTHER): Payer: PPO

## 2020-01-22 DIAGNOSIS — R002 Palpitations: Secondary | ICD-10-CM

## 2020-01-22 DIAGNOSIS — Z7901 Long term (current) use of anticoagulants: Secondary | ICD-10-CM

## 2020-01-22 DIAGNOSIS — I480 Paroxysmal atrial fibrillation: Secondary | ICD-10-CM | POA: Insufficient documentation

## 2020-01-22 DIAGNOSIS — Z5181 Encounter for therapeutic drug level monitoring: Secondary | ICD-10-CM | POA: Diagnosis not present

## 2020-01-22 DIAGNOSIS — E782 Mixed hyperlipidemia: Secondary | ICD-10-CM

## 2020-01-22 LAB — BASIC METABOLIC PANEL
Anion gap: 8 (ref 5–15)
BUN: 15 mg/dL (ref 8–23)
CO2: 30 mmol/L (ref 22–32)
Calcium: 9.2 mg/dL (ref 8.9–10.3)
Chloride: 102 mmol/L (ref 98–111)
Creatinine, Ser: 1.08 mg/dL (ref 0.61–1.24)
GFR calc Af Amer: >60 mL/min (ref >60)
GFR calc non Af Amer: >60 mL/min (ref >60)
Glucose, Bld: 90 mg/dL (ref 70–99)
Potassium: 4.1 mmol/L (ref 3.5–5.1)
Sodium: 140 mmol/L (ref 135–145)

## 2020-01-22 LAB — CBC
HCT: 41.5 % (ref 39.0–52.0)
Hemoglobin: 14.2 g/dL (ref 13.0–17.0)
MCH: 31.4 pg (ref 26.0–34.0)
MCHC: 34.2 g/dL (ref 30.0–36.0)
MCV: 91.8 fL (ref 80.0–100.0)
Platelets: 206 10*3/uL (ref 150–400)
RBC: 4.52 MIL/uL (ref 4.22–5.81)
RDW: 12.9 % (ref 11.5–15.5)
WBC: 6.2 10*3/uL (ref 4.0–10.5)
nRBC: 0 % (ref 0.0–0.2)

## 2020-01-22 LAB — POCT INR: INR: 1.6 — AB (ref 2.0–3.0)

## 2020-01-22 LAB — LIPID PANEL
Cholesterol: 238 mg/dL — ABNORMAL HIGH (ref 0–200)
HDL: 52 mg/dL (ref >40)
LDL Cholesterol: 143 mg/dL — ABNORMAL HIGH (ref 0–99)
Total CHOL/HDL Ratio: 4.6 RATIO
Triglycerides: 217 mg/dL — ABNORMAL HIGH (ref <150)
VLDL: 43 mg/dL — ABNORMAL HIGH (ref 0–40)

## 2020-01-22 MED ORDER — SODIUM CHLORIDE 0.9% FLUSH
3.0000 mL | Freq: Two times a day (BID) | INTRAVENOUS | Status: AC
  Filled 2020-01-22: qty 3

## 2020-01-22 MED ORDER — ENOXAPARIN SODIUM 80 MG/0.8ML ~~LOC~~ SOLN: 80.0000 mg | Freq: Two times a day (BID) | SUBCUTANEOUS | 1 refills | Status: DC

## 2020-01-22 MED ORDER — EZETIMIBE 10 MG PO TABS
10.0000 mg | ORAL_TABLET | Freq: Every day | ORAL | 3 refills | Status: DC
Start: 2020-01-22 — End: 2020-04-24

## 2020-01-22 NOTE — Addendum Note (Signed)
Addended by: Verlon Au on: 01/22/2020 09:48 AM   Modules accepted: Orders

## 2020-01-22 NOTE — Addendum Note (Signed)
Addended by: Kavin Leech on: 01/22/2020 03:12 PM   Modules accepted: Orders

## 2020-01-22 NOTE — Progress Notes (Signed)
Ordered per Jacquelyn Visser's result note.

## 2020-01-22 NOTE — Progress Notes (Signed)
Monday, 8/16: Inject Lovenox in the fatty tissue every 12 hours. No warfarin.  Tuesday, 8/17: Inject Lovenox in the fatty tissue every 12 hours, 8am and 8pm. No warfarin.  Wednesday, 8/18: Inject Lovenox in the fatty tissue in the morning at 8 am (No PM dose). No warfarin.  Thursday, 8/19: Procedure Day - No Lovenox - Resume warfarin in the evening or as directed by doctor (take an extra half tablet with usual dose for 2 days then resume normal dose).  Friday, 8/20: Resume Lovenox inject in the fatty tissue every 12 hours and take warfarin.  Saturday, 8/21: Inject Lovenox in the fatty tissue every 12 hours and take warfarin.  Sunday, 8/22: Inject Lovenox in the fatty tissue every 12 hours and take warfarin.  Monday, 8/23: Inject Lovenox in the fatty tissue every 12 hours and take warfarin.  Tuesday, 8/24: Inject Lovenox in the fatty tissue every 12 hours and take warfarin.  Wednesday, 8/25:Inject Lovenox in the fatty tissue in am and take warfarin. Appt to check INR.

## 2020-01-22 NOTE — Patient Instructions (Signed)
Monday, 8/16: Inject Lovenox in the fatty tissue every 12 hours. No warfarin.  Tuesday, 8/17: Inject Lovenox in the fatty tissue every 12 hours, 8am and 8pm. No warfarin.  Wednesday, 8/18: Inject Lovenox in the fatty tissue in the morning at 8 am (No PM dose). No warfarin.  Thursday, 8/19: Procedure Day - No Lovenox - Resume warfarin in the evening or as directed by doctor (take an extra half tablet with usual dose for 2 days then resume normal dose).  Friday, 8/20: Resume Lovenox inject in the fatty tissue every 12 hours and take warfarin.  Saturday, 8/21: Inject Lovenox in the fatty tissue every 12 hours and take warfarin.  Sunday, 8/22: Inject Lovenox in the fatty tissue every 12 hours and take warfarin.  Monday, 8/23: Inject Lovenox in the fatty tissue every 12 hours and take warfarin.  Tuesday, 8/24: Inject Lovenox in the fatty tissue every 12 hours and take warfarin.  Wednesday, 8/25:Inject Lovenox in the fatty tissue in am and take warfarin. Appt to check INR.

## 2020-01-23 ENCOUNTER — Other Ambulatory Visit: Payer: Self-pay | Admitting: Physician Assistant

## 2020-01-23 ENCOUNTER — Other Ambulatory Visit
Admission: RE | Admit: 2020-01-23 | Discharge: 2020-01-23 | Disposition: A | Discharge status: Home / Self Care | Payer: PPO | Source: Ambulatory Visit | Attending: Cardiovascular Disease | Admitting: Cardiovascular Disease

## 2020-01-23 DIAGNOSIS — Z01812 Encounter for preprocedural laboratory examination: Secondary | ICD-10-CM | POA: Insufficient documentation

## 2020-01-23 DIAGNOSIS — Z20822 Contact with and (suspected) exposure to covid-19: Secondary | ICD-10-CM | POA: Diagnosis not present

## 2020-01-23 LAB — SARS CORONAVIRUS 2 (TAT 6-24 HRS): SARS Coronavirus 2: NEGATIVE

## 2020-01-24 ENCOUNTER — Other Ambulatory Visit: Payer: Self-pay | Admitting: Cardiovascular Disease

## 2020-01-24 DIAGNOSIS — C4362 Malignant melanoma of left upper limb, including shoulder: Secondary | ICD-10-CM | POA: Diagnosis not present

## 2020-01-24 DIAGNOSIS — D0362 Melanoma in situ of left upper limb, including shoulder: Secondary | ICD-10-CM | POA: Diagnosis not present

## 2020-01-25 ENCOUNTER — Ambulatory Visit
Admission: RE | Admit: 2020-01-25 | Discharge: 2020-01-25 | Disposition: A | Discharge status: Home / Self Care | Payer: PPO | Attending: Cardiovascular Disease | Admitting: Cardiovascular Disease

## 2020-01-25 ENCOUNTER — Encounter
Admission: RE | Disposition: A | Discharge status: Home / Self Care | Payer: PPO | Source: Home / Self Care | Attending: Cardiovascular Disease

## 2020-01-25 ENCOUNTER — Other Ambulatory Visit: Payer: Self-pay | Admitting: Physician Assistant

## 2020-01-25 ENCOUNTER — Encounter: Payer: Self-pay | Admitting: Cardiovascular Disease

## 2020-01-25 ENCOUNTER — Other Ambulatory Visit: Payer: Self-pay

## 2020-01-25 DIAGNOSIS — R0602 Shortness of breath: Secondary | ICD-10-CM | POA: Insufficient documentation

## 2020-01-25 DIAGNOSIS — G4730 Sleep apnea, unspecified: Secondary | ICD-10-CM | POA: Insufficient documentation

## 2020-01-25 DIAGNOSIS — R270 Ataxia, unspecified: Secondary | ICD-10-CM | POA: Diagnosis not present

## 2020-01-25 DIAGNOSIS — E785 Hyperlipidemia, unspecified: Secondary | ICD-10-CM | POA: Diagnosis not present

## 2020-01-25 DIAGNOSIS — Z7982 Long term (current) use of aspirin: Secondary | ICD-10-CM | POA: Insufficient documentation

## 2020-01-25 DIAGNOSIS — Z7901 Long term (current) use of anticoagulants: Secondary | ICD-10-CM | POA: Insufficient documentation

## 2020-01-25 DIAGNOSIS — Z881 Allergy status to other antibiotic agents status: Secondary | ICD-10-CM | POA: Diagnosis not present

## 2020-01-25 DIAGNOSIS — N401 Enlarged prostate with lower urinary tract symptoms: Secondary | ICD-10-CM | POA: Diagnosis not present

## 2020-01-25 DIAGNOSIS — I480 Paroxysmal atrial fibrillation: Secondary | ICD-10-CM | POA: Diagnosis not present

## 2020-01-25 DIAGNOSIS — I110 Hypertensive heart disease with heart failure: Secondary | ICD-10-CM | POA: Diagnosis not present

## 2020-01-25 DIAGNOSIS — I200 Unstable angina: Secondary | ICD-10-CM

## 2020-01-25 DIAGNOSIS — K589 Irritable bowel syndrome without diarrhea: Secondary | ICD-10-CM | POA: Diagnosis not present

## 2020-01-25 DIAGNOSIS — F329 Major depressive disorder, single episode, unspecified: Secondary | ICD-10-CM | POA: Diagnosis not present

## 2020-01-25 DIAGNOSIS — I272 Pulmonary hypertension, unspecified: Secondary | ICD-10-CM | POA: Insufficient documentation

## 2020-01-25 DIAGNOSIS — R230 Cyanosis: Secondary | ICD-10-CM | POA: Insufficient documentation

## 2020-01-25 DIAGNOSIS — Z79899 Other long term (current) drug therapy: Secondary | ICD-10-CM | POA: Diagnosis not present

## 2020-01-25 DIAGNOSIS — I5033 Acute on chronic diastolic (congestive) heart failure: Secondary | ICD-10-CM | POA: Insufficient documentation

## 2020-01-25 DIAGNOSIS — I25110 Atherosclerotic heart disease of native coronary artery with unstable angina pectoris: Secondary | ICD-10-CM | POA: Insufficient documentation

## 2020-01-25 DIAGNOSIS — Z8249 Family history of ischemic heart disease and other diseases of the circulatory system: Secondary | ICD-10-CM | POA: Diagnosis not present

## 2020-01-25 DIAGNOSIS — I252 Old myocardial infarction: Secondary | ICD-10-CM | POA: Insufficient documentation

## 2020-01-25 DIAGNOSIS — I2510 Atherosclerotic heart disease of native coronary artery without angina pectoris: Secondary | ICD-10-CM | POA: Diagnosis present

## 2020-01-25 DIAGNOSIS — K219 Gastro-esophageal reflux disease without esophagitis: Secondary | ICD-10-CM | POA: Insufficient documentation

## 2020-01-25 DIAGNOSIS — I69354 Hemiplegia and hemiparesis following cerebral infarction affecting left non-dominant side: Secondary | ICD-10-CM | POA: Diagnosis not present

## 2020-01-25 HISTORY — PX: RIGHT/LEFT HEART CATH AND CORONARY ANGIOGRAPHY: CATH118266

## 2020-01-25 SURGERY — RIGHT/LEFT HEART CATH AND CORONARY ANGIOGRAPHY
Anesthesia: Moderate Sedation

## 2020-01-25 MED ORDER — ONDANSETRON HCL 4 MG/2ML IJ SOLN: 4.0000 mg | Freq: Four times a day (QID) | INTRAMUSCULAR | Status: DC | PRN

## 2020-01-25 MED ORDER — SODIUM CHLORIDE 0.9 % IV SOLN: 250.0000 mL | INTRAVENOUS | Status: DC | PRN

## 2020-01-25 MED ORDER — SODIUM CHLORIDE 0.9 % WEIGHT BASED INFUSION: 1.0000 mL/kg/h | INTRAVENOUS | Status: DC

## 2020-01-25 MED ORDER — SODIUM CHLORIDE 0.9% FLUSH: 3.0000 mL | INTRAVENOUS | Status: DC | PRN

## 2020-01-25 MED ORDER — IOHEXOL 300 MG/ML  SOLN
INTRAMUSCULAR | Status: DC | PRN
  Administered 2020-01-25: 85 mL

## 2020-01-25 MED ORDER — ASPIRIN 81 MG PO CHEW: 81.0000 mg | CHEWABLE_TABLET | ORAL | Status: DC

## 2020-01-25 MED ORDER — FENTANYL CITRATE (PF) 100 MCG/2ML IJ SOLN
INTRAMUSCULAR | Status: AC
Start: 2020-01-25 — End: ?
  Filled 2020-01-25: qty 2

## 2020-01-25 MED ORDER — LABETALOL HCL 5 MG/ML IV SOLN: 10.0000 mg | INTRAVENOUS | Status: DC | PRN

## 2020-01-25 MED ORDER — SODIUM CHLORIDE 0.9 % WEIGHT BASED INFUSION: 3.0000 mL/kg/h | INTRAVENOUS | Status: AC

## 2020-01-25 MED ORDER — HEPARIN (PORCINE) IN NACL 1000-0.9 UT/500ML-% IV SOLN
INTRAVENOUS | Status: DC | PRN
  Administered 2020-01-25: 500 mL

## 2020-01-25 MED ORDER — SODIUM CHLORIDE 0.9% FLUSH: 3.0000 mL | Freq: Two times a day (BID) | INTRAVENOUS | Status: DC

## 2020-01-25 MED ORDER — HYDRALAZINE HCL 20 MG/ML IJ SOLN: 10.0000 mg | INTRAMUSCULAR | Status: DC | PRN

## 2020-01-25 MED ORDER — MIDAZOLAM HCL 2 MG/2ML IJ SOLN
INTRAMUSCULAR | Status: AC
  Filled 2020-01-25: qty 2

## 2020-01-25 MED ORDER — ACETAMINOPHEN 325 MG PO TABS: 650.0000 mg | ORAL_TABLET | ORAL | Status: DC | PRN

## 2020-01-25 MED ORDER — HEPARIN (PORCINE) IN NACL 1000-0.9 UT/500ML-% IV SOLN
INTRAVENOUS | Status: AC
  Filled 2020-01-25: qty 1000

## 2020-01-25 MED ORDER — MIDAZOLAM HCL 2 MG/2ML IJ SOLN
INTRAMUSCULAR | Status: DC | PRN
  Administered 2020-01-25 (×2): 1 mg via INTRAVENOUS

## 2020-01-25 MED ORDER — FENTANYL CITRATE (PF) 100 MCG/2ML IJ SOLN
INTRAMUSCULAR | Status: DC | PRN
  Administered 2020-01-25 (×2): 50 ug via INTRAVENOUS

## 2020-01-25 SURGICAL SUPPLY — 13 items
CATH INFINITI 5FR ANG PIGTAIL (CATHETERS) ×4 IMPLANT
CATH INFINITI 5FR JL4 (CATHETERS) ×4 IMPLANT
CATH INFINITI JR4 5F (CATHETERS) ×4 IMPLANT
CATH SWANZ 7F THERMO (CATHETERS) ×4 IMPLANT
DEVICE CLOSURE MYNXGRIP 5F (Vascular Products) ×4 IMPLANT
GUIDEWIRE EMER 3M J .025X150CM (WIRE) ×8 IMPLANT
KIT MANI 3VAL PERCEP (MISCELLANEOUS) ×4 IMPLANT
NEEDLE PERC 18GX7CM (NEEDLE) ×4 IMPLANT
PACK CARDIAC CATH (CUSTOM PROCEDURE TRAY) ×4 IMPLANT
SHEATH AVANTI 5FR X 11CM (SHEATH) ×4 IMPLANT
SHEATH AVANTI 7FRX11 (SHEATH) ×4 IMPLANT
WIRE GUIDERIGHT .035X150 (WIRE) ×4 IMPLANT
WIRE HITORQ VERSACORE ST 145CM (WIRE) ×4 IMPLANT

## 2020-01-25 NOTE — Discharge Instructions (Signed)
Angiogram, Care After This sheet gives you information about how to care for yourself after your procedure. Your health care provider may also give you more specific instructions. If you have problems or questions, contact your health care provider. What can I expect after the procedure? After the procedure, it is common to have bruising and tenderness at the catheter insertion area. Follow these instructions at home: Insertion site care  Follow instructions from your health care provider about how to take care of your insertion site. Make sure you: ? Wash your hands with soap and water before you change your bandage (dressing). If soap and water are not available, use hand sanitizer. ? Change your dressing as told by your health care provider. ? Leave stitches (sutures), skin glue, or adhesive strips in place. These skin closures may need to stay in place for 2 weeks or longer. If adhesive strip edges start to loosen and curl up, you may trim the loose edges. Do not remove adhesive strips completely unless your health care provider tells you to do that.  Do not take baths, swim, or use a hot tub until your health care provider approves.  You may shower 24-48 hours after the procedure or as told by your health care provider. ? Gently wash the site with plain soap and water. ? Pat the area dry with a clean towel. ? Do not rub the site. This may cause bleeding.  Do not apply powder or lotion to the site. Keep the site clean and dry.  Check your insertion site every day for signs of infection. Check for: ? Redness, swelling, or pain. ? Fluid or blood. ? Warmth. ? Pus or a bad smell. Activity  Rest as told by your health care provider, usually for 1-2 days.  Do not lift anything that is heavier than 10 lbs. (4.5 kg) or as told by your health care provider.  Do not drive for 24 hours if you were given a medicine to help you relax (sedative).  Do not drive or use heavy machinery while  taking prescription pain medicine. General instructions   Return to your normal activities as told by your health care provider, usually in about a week. Ask your health care provider what activities are safe for you.  If the catheter site starts bleeding, lie flat and put pressure on the site. If the bleeding does not stop, get help right away. This is a medical emergency.  Drink enough fluid to keep your urine clear or pale yellow. This helps flush the contrast dye from your body.  Take over-the-counter and prescription medicines only as told by your health care provider.  Keep all follow-up visits as told by your health care provider. This is important. Contact a health care provider if:  You have a fever or chills.  You have redness, swelling, or pain around your insertion site.  You have fluid or blood coming from your insertion site.  The insertion site feels warm to the touch.  You have pus or a bad smell coming from your insertion site.  You have bruising around the insertion site.  You notice blood collecting in the tissue around the catheter site (hematoma). The hematoma may be painful to the touch. Get help right away if:  You have severe pain at the catheter insertion area.  The catheter insertion area swells very fast.  The catheter insertion area is bleeding, and the bleeding does not stop when you hold steady pressure on the area.    The area near or just beyond the catheter insertion site becomes pale, cool, tingly, or numb. These symptoms may represent a serious problem that is an emergency. Do not wait to see if the symptoms will go away. Get medical help right away. Call your local emergency services (911 in the [place] Do not drive yourself to the hospital. Summary  After the procedure, it is common to have bruising and tenderness at the catheter insertion area.  After the procedure, it is important to rest and drink plenty of fluids.  Do not take baths,  swim, or use a hot tub until your health care provider says it is okay to do so. You may shower 24-48 hours after the procedure or as told by your health care provider.  If the catheter site starts bleeding, lie flat and put pressure on the site. If the bleeding does not stop, get help right away. This is a medical emergency. This information is not intended to replace advice given to you by your health care provider. Make sure you discuss any questions you have with your health care provider. Document Revised: 05/07/2017 Document Reviewed: 04/29/2016 Elsevier Patient Education  2020 [employer]  

## 2020-01-26 ENCOUNTER — Encounter: Payer: Self-pay | Admitting: Cardiovascular Disease

## 2020-01-29 ENCOUNTER — Telehealth: Payer: Self-pay | Admitting: Cardiovascular Disease

## 2020-01-29 ENCOUNTER — Other Ambulatory Visit: Payer: Self-pay | Admitting: Cardiovascular Disease

## 2020-01-29 MED ORDER — AMIODARONE HCL 100 MG PO TABS
100.0000 mg | ORAL_TABLET | Freq: Every day | ORAL | 3 refills | Status: DC
Start: 2020-01-29 — End: 2020-09-02

## 2020-01-29 NOTE — Telephone Encounter (Signed)
Please call and advise if patient should still be taking Amiodarone.

## 2020-01-29 NOTE — Interval H&P Note (Signed)
History and Physical Interval Note:  01/29/2020 12:14 PM  [name]  has presented today for surgery, with the diagnosis of LT Cath   Unstable angina   CAD, prior PCI, SOB, atrial fibrillation.  The various methods of treatment have been discussed with the patient and family. After consideration of risks, benefits and other options for treatment, the patient has consented to  Procedure(s): RIGHT/LEFT HEART CATH AND CORONARY ANGIOGRAPHY (N/A) as a surgical intervention.  The patient's history has been reviewed, patient examined, no change in status, stable for surgery.  I have reviewed the patient's chart and labs.  Questions were answered to the patient's satisfaction.     Ida Rogue

## 2020-01-29 NOTE — H&P (Signed)
H&P Addendum, precardiac catheterization  Patient was seen and evaluated prior to Cardiac catheterization procedure Symptoms, prior testing details again confirmed with the patient Patient examined, no significant change from prior exam Lab work reviewed in detail personally by myself Patient understands risk and benefit of the procedure, willing to proceed  Signed, Tim Marciana Uplinger, MD, Ph.D [facility]    

## 2020-01-29 NOTE — Telephone Encounter (Signed)
Spoke with patient and he wanted to make sure he should still be taking the amiodarone because pharmacy called him to clarify. Advised that I do not see where it has been discontinued and that he should still be on that medication. Sent in refill to his pharmacy and he had no further questions at this time.

## 2020-01-31 ENCOUNTER — Ambulatory Visit (INDEPENDENT_AMBULATORY_CARE_PROVIDER_SITE_OTHER): Payer: PPO

## 2020-01-31 ENCOUNTER — Other Ambulatory Visit: Payer: Self-pay

## 2020-01-31 DIAGNOSIS — Z5181 Encounter for therapeutic drug level monitoring: Secondary | ICD-10-CM | POA: Diagnosis not present

## 2020-01-31 DIAGNOSIS — Z7901 Long term (current) use of anticoagulants: Secondary | ICD-10-CM | POA: Diagnosis not present

## 2020-01-31 DIAGNOSIS — R002 Palpitations: Secondary | ICD-10-CM | POA: Diagnosis not present

## 2020-01-31 LAB — POCT INR: INR: 1.2 — AB (ref 2.0–3.0)

## 2020-01-31 NOTE — Patient Instructions (Signed)
-   CONTINUE LOVENOX INJECTIONS UNTIL INR >2.0 - take extra 1/2 tablet tonight and tomorrow, then  - resume dosage of warfarin 1/2 tablet every day EXCEPT 1 tablet on MONDAYS, [place]. - Recheck INR on Monday

## 2020-02-01 ENCOUNTER — Encounter: Payer: Self-pay | Admitting: Family

## 2020-02-01 ENCOUNTER — Ambulatory Visit: Payer: PPO | Admitting: Family

## 2020-02-01 VITALS — BP 126/84 | HR 67 | Ht 72.0 in | Wt 193.0 lb

## 2020-02-01 DIAGNOSIS — I25118 Atherosclerotic heart disease of native coronary artery with other forms of angina pectoris: Secondary | ICD-10-CM | POA: Diagnosis not present

## 2020-02-01 DIAGNOSIS — Z7901 Long term (current) use of anticoagulants: Secondary | ICD-10-CM

## 2020-02-01 DIAGNOSIS — I480 Paroxysmal atrial fibrillation: Secondary | ICD-10-CM

## 2020-02-01 DIAGNOSIS — I5032 Chronic diastolic (congestive) heart failure: Secondary | ICD-10-CM | POA: Diagnosis not present

## 2020-02-01 DIAGNOSIS — I10 Essential (primary) hypertension: Secondary | ICD-10-CM | POA: Diagnosis not present

## 2020-02-01 DIAGNOSIS — E785 Hyperlipidemia, unspecified: Secondary | ICD-10-CM

## 2020-02-01 NOTE — Progress Notes (Signed)
Office Visit    Patient Name: [name] Date of Encounter: 02/01/2020  Primary Care Provider:  [name], MD Primary Cardiologist:  Ida Rogue, MD Electrophysiologist:  None   Chief Complaint    [name] is a 74 y.o. male with a hx of CAD s/p PCI of LCx 2007, PAF on amiodarone and Coumadin, CVA treated with TPA and residual left-sided weakness, reported mini stroke 2015 on PET scan without symptoms and on Coumadin at the time, alcohol use with 3 to 5 glasses of wine per night, OSA and central sleep apnea on CPAP, prior retinal tear, HTN, IBS, GERD  presents today for follow-up after cardiac catheterization  Past Medical History    Past Medical History:  Diagnosis Date  . BPH (benign prostatic hyperplasia)   . Coronary artery disease    a. s/p PCI to LCx in 2007; b. MV 2017 no sig ischemia, EF 59%, nl study  . Depression   . GERD (gastroesophageal reflux disease)   . Hyperlipidemia   . Hypertension   . IBS (irritable bowel syndrome)   . Myocardial infarction ([place])    12-13 yrs ago   . PAF (paroxysmal atrial fibrillation) ([place])    a. CHADS2VASc => 5 (HTN, age x 1, stroke x 2, vascular disease); b. on Coumadin   . PNA (pneumonia)   . Skin cancer    face   . Sleep apnea    off cpap   . Stroke [facility])    a. x 2 with residual left-sided weakness   Past Surgical History:  Procedure Laterality Date  . ADENOIDECTOMY  1951  . CARDIAC CATHETERIZATION  2006   X1 STENT  . COLONOSCOPY    . CORONARY ANGIOPLASTY    . POLYPECTOMY    . RIGHT/LEFT HEART CATH AND CORONARY ANGIOGRAPHY N/A 01/25/2020   Procedure: RIGHT/LEFT HEART CATH AND CORONARY ANGIOGRAPHY;  Surgeon: Minna Merritts, MD;  Location: [facility];  Service: Cardiovascular;  Laterality: N/A;  . SKIN CANCER EXCISION N/A 08/2016   [place].  [facility] (Dr. Deliah Boston)  . THYROIDECTOMY, PARTIAL    . UPPER GASTROINTESTINAL ENDOSCOPY    . VASCULAR SURGERY     varicose vein  stripping left leg    Allergies  Allergies  Allergen Reactions  . Bactrim [Sulfamethoxazole-Trimethoprim] Anaphylaxis    Ulcers  . Ciprofloxacin Swelling    Tongue  . Crestor [Rosuvastatin Calcium]     Severe Myalgias    History of Present Illness    [name] is a 74 y.o. male with a hx of CAD s/p PCI of LCx 2007, PAF on amiodarone and Coumadin, CVA treated with TPA and residual left-sided weakness, reported mini stroke 2015 on PET scan without symptoms and on Coumadin at the time, alcohol use with 3 to 5 glasses of wine per night, OSA and central sleep apnea on CPAP, prior retinal tear, HTN, IBS, GERD last seen 01/25/2020 for cardiac catheterization.  Prior ischemic evaluation January 2017 stress test showing no extremity ischemia and EF 55 to 65%.  Holter monitor July 2017 with normal sinus rhythm and rare PAC/PVC with burden less than 1%.  Longest atrial run of 6 beats.  Repeat cardiac monitoring 01/2017 showed normal sinus rhythm with average rate of 69 bpm and PAF with 6% burden.  Longest episode 17 hours 41 minutes.  34 runs of SVT.  ED visit 07/2018 with ataxia and left-sided weakness increased from baseline.  CT without acute findings.  MRI without  acute findings and small focus of chronic hemorrhage of left cerebellum, chronic ischemic microangiopathy [name] and generalized atrophy.  He was recommended to follow-up with neurology  Seen in our clinic 3/20 million reporting shortness of breath and myalgias possibly related to neuropathy.  He reported fatigue, shortness of breath.  Seen in clinic 01/2020 noting fatigue, lightheadedness, tachypalpitations, diaphoresis, indigestion, shortness of breath, multiple complaints.  He was noted to me to not be wearing CPAP nor taking his Lasix.  He was tolerating pravastatin.  Subsequent left/right heart cath 01/25/2020 with proximal circumflex to mid circumflex lesion 10% stenosed with otherwise patent stent, normal LVEF 55 to 65%, LVEDP  normal, no MR.  Reviewed cardiac catheterization results in depth.  He was reassured by the result.  Reports continued fatigue ongoing for more than one year.  He is glad to know it is not his heart causing it but hopeful to find out the cause.  Encouraged to follow-up with PCP.  Encouraged to possibly discuss physical therapy with PCP.  Weight up 4 pounds compared to clinic visit a few weeks ago.  He does notice some abdominal bloating.  He is been taking Lasix about once per week.  Encouraged to take for 3 days to reduce fluid status.  Reports no shortness of breath nor dyspnea on exertion. Reports no chest pain, pressure, or tightness. No edema, orthopnea, PND. Reports no palpitations.   EKGs/Labs/Other Studies Reviewed:   The following studies were reviewed today:  Echo 10/05/2019  1. Left ventricular ejection fraction, by estimation, is 60 to 65%. The  left ventricle has normal function. The left ventricle has no regional  wall motion abnormalities. Left ventricular diastolic parameters are  consistent with Grade I diastolic  dysfunction (impaired relaxation).   2. Right ventricular systolic function is normal. The right ventricular  size is normal. There is mildly elevated pulmonary artery systolic  pressure. The estimated right ventricular systolic pressure is 10.6 mmHg.   3. Left atrial size was mildly dilated.   4. Right atrial size was mildly dilated.   Right/left heart cath 01/25/2020 Prox Cx to Mid Cx lesion is 10% stenosed. The left ventricular systolic function is normal. LV end diastolic pressure is normal. The left ventricular ejection fraction is 55-65% by visual estimate. There is no mitral valve regurgitation.  EKG:  No EKG today.   Recent Labs: 07/14/2019: ALT 21; Brain Natriuretic Peptide 270 01/22/2020: BUN 15; Creatinine, Ser 1.08; Hemoglobin 14.2; Platelets 206; Potassium 4.1; Sodium 140  Recent Lipid Panel    Component Value Date/Time   CHOL 238 (H)  01/22/2020 1020   TRIG 217 (H) 01/22/2020 1020   HDL 52 01/22/2020 1020   CHOLHDL 4.6 01/22/2020 1020   VLDL 43 (H) 01/22/2020 1020   LDLCALC 143 (H) 01/22/2020 1020   LDLCALC 67 07/14/2019 1439    Home Medications   Current Meds  Medication Sig  . acetaminophen (TYLENOL) 500 MG tablet Take 1,000 mg by mouth every 6 (six) hours as needed for mild pain.  Marland Kitchen amiodarone (PACERONE) 100 MG tablet Take 1 tablet (100 mg total) by mouth daily.  Marland Kitchen amLODipine (NORVASC) 5 MG tablet TAKE 1 TABLET BY MOUTH EVERY DAY FOR BLOOD PRESSURE (Patient taking differently: Take 5 mg by mouth daily with supper. )  . aspirin 81 MG tablet Take 81 mg by mouth at bedtime.   . bisoprolol (ZEBETA) 10 MG tablet TAKE 1 AND 1/2 TABLETS BY MOUTH EVERY DAY FOR BLOOD PRESSURE. (Patient taking differently: Take 15 mg  by mouth daily. for blood pressure.)  . Cholecalciferol (VITAMIN D3) 50 MCG (2000 UT) TABS Take 2,000 Units by mouth at bedtime.   . DULoxetine (CYMBALTA) 20 MG capsule Take 40 mg by mouth daily.  Marland Kitchen enoxaparin (LOVENOX) 80 MG/0.8ML injection Inject 0.8 mLs (80 mg total) into the skin every 12 (twelve) hours for 10 days.  Marland Kitchen ezetimibe (ZETIA) 10 MG tablet Take 1 tablet (10 mg total) by mouth daily.  . furosemide (LASIX) 20 MG tablet Take 20 mg by mouth daily as needed for fluid or edema.   . hydrochlorothiazide (HYDRODIURIL) 12.5 MG tablet Take 1 tablet (12.5 mg total) by mouth daily. For blood pressure.  Marland Kitchen lisinopril (ZESTRIL) 40 MG tablet TAKE 1 TABLET (40 MG TOTAL) BY MOUTH DAILY. FOR BLOOD PRESSURE. (Patient taking differently: Take 10 mg by mouth daily. For blood pressure.)  . Multiple Vitamin (MULTIVITAMIN) capsule Take 1 capsule by mouth daily.  . Multiple Vitamins-Minerals (MULTIVITAMIN WITH MINERALS) tablet Take 1 tablet by mouth daily. Centrum Silver  . nitroGLYCERIN (NITROSTAT) 0.4 MG SL tablet Place 1 tablet (0.4 mg total) every 5 (five) minutes as needed under the tongue for chest pain.  Marland Kitchen omeprazole  (PRILOSEC) 40 MG capsule TAKE 1 CAPSULE (40 MG TOTAL) BY MOUTH 2 (TWO) TIMES DAILY BEFORE A MEAL. FOR HEARTBURN. (Patient taking differently: Take 40 mg by mouth daily. )  . potassium chloride (KLOR-CON) 10 MEQ tablet Take 10 mEq by mouth daily as needed (low Potassium). Only take with lasix  . pravastatin (PRAVACHOL) 40 MG tablet Take 1 tablet (40 mg total) by mouth daily. For cholesterol. (Patient taking differently: Take 80 mg by mouth daily. For cholesterol.)  . triamcinolone cream (KENALOG) 0.1 % Apply 1 application topically 2 (two) times daily. (Patient taking differently: Apply 1 application topically daily as needed (Rash). )  . warfarin (COUMADIN) 7.5 MG tablet Take 0.5-1 tablets (3.75-7.5 mg total) by mouth See admin instructions. Take 7.5mg  on sundays, all other days take 3.75mg  or as directed by coumadin clinic. (Patient taking differently: Take 3.75-7.5 mg by mouth See admin instructions. Take 7.5mg  on Monday,Wednesday and Friday 3.75 mg all the other dayys)  . zaleplon (SONATA) 5 MG capsule Take 5 mg by mouth at bedtime.     Review of Systems       Review of Systems  Constitutional: Positive for malaise/fatigue. Negative for chills and fever.  Cardiovascular: Negative for chest pain, dyspnea on exertion, leg swelling, near-syncope, orthopnea, palpitations and syncope.  Respiratory: Negative for cough, shortness of breath and wheezing.   Gastrointestinal: Positive for bloating. Negative for nausea and vomiting.  Neurological: Negative for dizziness, light-headedness and weakness.   All other systems reviewed and are otherwise negative except as noted above.  Physical Exam    VS:  BP 126/84 (BP Location: Left Arm, Patient Position: Sitting, Cuff Size: Normal)   Pulse 67   Ht 6' (1.829 m)   Wt 193 lb (87.5 kg)   SpO2 98%   BMI 26.18 kg/m  , BMI Body mass index is 26.18 kg/m. GEN: Well nourished, well developed, in no acute distress. HEENT: normal. Neck: Supple, no JVD,  carotid bruits, or masses. Cardiac: RRR, no murmurs, rubs, or gallops. No clubbing, cyanosis, edema.  Radials/DP/PT 2+ and equal bilaterally.  Respiratory:  Respirations regular and unlabored, clear to auscultation bilaterally. GI: Soft, nontender, nondistended, BS + x 4. MS: No deformity or atrophy. Skin: Warm and dry, no rash.  Right groin site with no hematoma nor evidence of  infection.  Does have ecchymosis and small skin tear that is healing appropriately. Neuro:  Strength and sensation are intact. Psych: Normal affect..  Assessment & Plan    1. CAD -cardiac catheterization last week with patent stent to the circumflex with only 10% in-stent stent restenosis.  No cardiac catheterization findings to explain his fatigue-likely noncardiac.  Encouraged to follow-up with primary care provider.  Right groin site healing appropriately with some ecchymosis and small skin tear.  GDMT includes aspirin, beta-blocker, statin, PRN nitroglycerin.   2. HFpEF -right heart cath last week with normal LVEDP, no MR, normal LVEF.  Well compensated on exam.  Weight noted to be up 4 pounds from clinic visit 2 weeks ago.  Has been taking his Lasix once per week.  Encouraged to take Lasix and potassium for 3 days in a row to get rid of slight volume excess.  GDMT includes bisoprolol, furosemide, potassium.  3. PAF on warfarin -maintaining sinus rhythm by EKG today.  Continue amiodarone 100 mg daily.  Continue warfarin per Coumadin clinic.  Denies bleeding complications.  4. History of CVA -continue optimal BP and lipid control.  Continue aspirin and statin.  5. HTN - BP well controlled. Continue current antihypertensive regimen.   6. HLD -presently on pravastatin 40 mg daily.  Previously intolerant to rosuvastatin with myalgias.  Zetia was resumed 01/22/2020.  Plan for repeat lipid panel in 6 weeks.  If LDL not at goal of less than 70 plan for Repatha versus Nexletol.  Of note Repatha assistance was denied this year  though he is interested on being on it again.  7. OSA not on CPAP -continues to decline CPAP as he does not feel it helped him.  We discussed the role of sleep apnea and fatigue.  Disposition: Follow up in 4 month(s) with Dr. Rockey Situ or APP   Loel Dubonnet, NP 02/01/2020, 4:50 PM

## 2020-02-01 NOTE — Patient Instructions (Addendum)
Medication Instructions:  Recommend taking your Lasix for 3 days in a row as your weight has gone up a few pounds.   *If you need a refill on your cardiac medications before your next appointment, please call your pharmacy*   Lab Work: Your provicer recommends that you return for lab work the first two weeks of October for repeat fasting lipid panel.  - Please go to the [facility]. You will check in at the front desk to the right as you walk into the atrium. Valet Parking is offered if needed. - No appointment needed. You may go any day between 7 am and 6 pm.  If you have labs (blood work) drawn today and your tests are completely normal, you will receive your results only by: Marland Kitchen MyChart Message (if you have MyChart) OR . A paper copy in the mail If you have any lab test that is abnormal or we need to change your treatment, we will call you to review the results.  Testing/Procedures: None ordered today. Your recent cardiac catheterization showed normal heart pumping function and that your stent is working properly.  Follow-Up: At [facility], you and your health needs are our priority.  As part of our continuing mission to provide you with exceptional heart care, we have created designated Provider Care Teams.  These Care Teams include your primary Cardiologist (physician) and Advanced Practice Providers (APPs -  Physician Assistants and Nurse Practitioners) who all work together to provide you with the care you need, when you need it.  We recommend signing up for the patient portal called "MyChart".  Sign up information is provided on this After Visit Summary.  MyChart is used to connect with patients for Virtual Visits (Telemedicine).  Patients are able to view lab/test results, encounter notes, upcoming appointments, etc.  Non-urgent messages can be sent to your provider as well.   To learn more about what you can do with MyChart, go to [email or web address].    Your next  appointment:   4 month(s)  The format for your next appointment:   In Person  Provider:    You may see Ida Rogue, MD or one of the following Advanced Practice Providers on your designated Care Team:    Murray Hodgkins, NP  Christell Faith, PA-C  Marrianne Mood, PA-C  Laurann Montana, NP

## 2020-02-05 ENCOUNTER — Other Ambulatory Visit: Payer: Self-pay

## 2020-02-05 ENCOUNTER — Ambulatory Visit (INDEPENDENT_AMBULATORY_CARE_PROVIDER_SITE_OTHER): Payer: PPO

## 2020-02-05 DIAGNOSIS — Z7901 Long term (current) use of anticoagulants: Secondary | ICD-10-CM | POA: Diagnosis not present

## 2020-02-05 DIAGNOSIS — R002 Palpitations: Secondary | ICD-10-CM

## 2020-02-05 DIAGNOSIS — Z5181 Encounter for therapeutic drug level monitoring: Secondary | ICD-10-CM

## 2020-02-05 LAB — POCT INR: INR: 1.6 — AB (ref 2.0–3.0)

## 2020-02-05 NOTE — Patient Instructions (Signed)
-   CONTINUE LOVENOX INJECTIONS UNTIL INR >2.0 - take 2 TABLETS TONIGHT, 1 WHOLE TABLET TOMORROW,  then  - resume dosage of warfarin 1/2 tablet every day EXCEPT 1 tablet on MONDAYS, [name]. - Recheck INR next Wednesday

## 2020-02-07 DIAGNOSIS — C44712 Basal cell carcinoma of skin of right lower limb, including hip: Secondary | ICD-10-CM | POA: Diagnosis not present

## 2020-02-08 ENCOUNTER — Ambulatory Visit: Payer: PPO | Admitting: Physician Assistant

## 2020-02-14 ENCOUNTER — Ambulatory Visit (INDEPENDENT_AMBULATORY_CARE_PROVIDER_SITE_OTHER): Payer: PPO

## 2020-02-14 ENCOUNTER — Other Ambulatory Visit: Payer: Self-pay

## 2020-02-14 DIAGNOSIS — Z7901 Long term (current) use of anticoagulants: Secondary | ICD-10-CM | POA: Diagnosis not present

## 2020-02-14 DIAGNOSIS — Z5181 Encounter for therapeutic drug level monitoring: Secondary | ICD-10-CM

## 2020-02-14 DIAGNOSIS — R002 Palpitations: Secondary | ICD-10-CM

## 2020-02-14 LAB — POCT INR: INR: 1.9 — AB (ref 2.0–3.0)

## 2020-02-14 NOTE — Patient Instructions (Signed)
-   take 2 TABLETS TONIGHT  then  - resume dosage of warfarin 1/2 tablet every day EXCEPT 1 tablet on MONDAYS, [place]. - Recheck INR in 2 weeks

## 2020-02-15 DIAGNOSIS — F330 Major depressive disorder, recurrent, mild: Secondary | ICD-10-CM | POA: Diagnosis not present

## 2020-02-15 DIAGNOSIS — F5105 Insomnia due to other mental disorder: Secondary | ICD-10-CM | POA: Diagnosis not present

## 2020-02-15 DIAGNOSIS — F411 Generalized anxiety disorder: Secondary | ICD-10-CM | POA: Diagnosis not present

## 2020-02-20 ENCOUNTER — Ambulatory Visit: Payer: PPO | Admitting: Cardiovascular Disease

## 2020-02-26 ENCOUNTER — Ambulatory Visit (INDEPENDENT_AMBULATORY_CARE_PROVIDER_SITE_OTHER): Payer: PPO

## 2020-02-26 ENCOUNTER — Other Ambulatory Visit: Payer: Self-pay

## 2020-02-26 DIAGNOSIS — Z7901 Long term (current) use of anticoagulants: Secondary | ICD-10-CM | POA: Diagnosis not present

## 2020-02-26 DIAGNOSIS — Z5181 Encounter for therapeutic drug level monitoring: Secondary | ICD-10-CM

## 2020-02-26 DIAGNOSIS — R002 Palpitations: Secondary | ICD-10-CM | POA: Diagnosis not present

## 2020-02-26 LAB — POCT INR: INR: 1.9 — AB (ref 2.0–3.0)

## 2020-02-26 NOTE — Patient Instructions (Signed)
-   take 1.5 TABLETS TONIGHT  then  - START NEW DOSAGE 1 tablet every day EXCEPT 1/2 tablet on MONDAYS, [place]. - Recheck INR in 2 weeks

## 2020-03-10 ENCOUNTER — Other Ambulatory Visit: Payer: Self-pay | Admitting: Primary Care

## 2020-03-10 DIAGNOSIS — I10 Essential (primary) hypertension: Secondary | ICD-10-CM

## 2020-03-11 ENCOUNTER — Ambulatory Visit (INDEPENDENT_AMBULATORY_CARE_PROVIDER_SITE_OTHER): Payer: PPO

## 2020-03-11 ENCOUNTER — Other Ambulatory Visit: Payer: Self-pay

## 2020-03-11 DIAGNOSIS — Z7901 Long term (current) use of anticoagulants: Secondary | ICD-10-CM | POA: Diagnosis not present

## 2020-03-11 DIAGNOSIS — R002 Palpitations: Secondary | ICD-10-CM | POA: Diagnosis not present

## 2020-03-11 DIAGNOSIS — Z5181 Encounter for therapeutic drug level monitoring: Secondary | ICD-10-CM | POA: Diagnosis not present

## 2020-03-11 LAB — POCT INR: INR: 2.2 (ref 2.0–3.0)

## 2020-03-11 NOTE — Patient Instructions (Signed)
-   continue warfarin dosage 1 tablet every day EXCEPT 1/2 tablet on MONDAYS, [place]. - Recheck INR in 3 weeks

## 2020-03-14 ENCOUNTER — Other Ambulatory Visit
Admission: RE | Admit: 2020-03-14 | Discharge: 2020-03-14 | Disposition: A | Discharge status: Home / Self Care | Payer: PPO | Source: Ambulatory Visit | Attending: Physician Assistant | Admitting: Physician Assistant

## 2020-03-14 DIAGNOSIS — E782 Mixed hyperlipidemia: Secondary | ICD-10-CM | POA: Diagnosis not present

## 2020-03-14 LAB — HEPATIC FUNCTION PANEL
ALT: 46 U/L — ABNORMAL HIGH (ref 0–44)
AST: 39 U/L (ref 15–41)
Albumin: 4 g/dL (ref 3.5–5.0)
Alkaline Phosphatase: 79 U/L (ref 38–126)
Bilirubin, Direct: <0.1 mg/dL (ref 0.0–0.2)
Total Bilirubin: 0.7 mg/dL (ref 0.3–1.2)
Total Protein: 7.5 g/dL (ref 6.5–8.1)

## 2020-03-14 LAB — LIPID PANEL
Cholesterol: 227 mg/dL — ABNORMAL HIGH (ref 0–200)
HDL: 46 mg/dL (ref >40)
LDL Cholesterol: 131 mg/dL — ABNORMAL HIGH (ref 0–99)
Total CHOL/HDL Ratio: 4.9 RATIO
Triglycerides: 250 mg/dL — ABNORMAL HIGH (ref <150)
VLDL: 50 mg/dL — ABNORMAL HIGH (ref 0–40)

## 2020-03-18 ENCOUNTER — Telehealth: Payer: Self-pay

## 2020-03-18 DIAGNOSIS — E785 Hyperlipidemia, unspecified: Secondary | ICD-10-CM

## 2020-03-18 NOTE — Telephone Encounter (Signed)
-----   Message from Loel Dubonnet, NP sent at 03/17/2020  7:52 PM EDT ----- One of his liver enzymes is just mildly elevated (ALT 46 instead of <45). Recommend avoidance of fried foods, alcohol, and acetaminophen/tylenol. LDL remains markedly elevated. Recommend starting Repatha. Was previously on this though had trouble with insurance coverage. Best option likely to refer to lipid clinic for assistance with coverage.

## 2020-03-18 NOTE — Telephone Encounter (Signed)
Pt verbalized understanding of his lab results and reports that he takes 1000 mg of Tylenol for restless leg every night.. I advised him to hold it for now and will forward to his PCP Dr. Kary Kos for review and recommendations.   Pt agreed to consult with the Lipid clinic.  He will call if he develops any problems or any further questions.

## 2020-03-19 NOTE — Telephone Encounter (Signed)
Fwd [address] scheduling

## 2020-03-22 ENCOUNTER — Ambulatory Visit (INDEPENDENT_AMBULATORY_CARE_PROVIDER_SITE_OTHER): Payer: PPO | Admitting: Pharmacist

## 2020-03-22 ENCOUNTER — Other Ambulatory Visit: Payer: Self-pay

## 2020-03-22 DIAGNOSIS — G720 Drug-induced myopathy: Secondary | ICD-10-CM

## 2020-03-22 DIAGNOSIS — T466X5A Adverse effect of antihyperlipidemic and antiarteriosclerotic drugs, initial encounter: Secondary | ICD-10-CM

## 2020-03-22 DIAGNOSIS — E782 Mixed hyperlipidemia: Secondary | ICD-10-CM

## 2020-03-22 NOTE — Progress Notes (Signed)
Patient ID: [name]                 DOB: 1946-05-13                    MRN: [id number]     HPI: [name] is a 74 y.o. male patient of Dr [name] referred to lipid clinic by William Montana, NP. PMH is significant for MI, CAD s/p PCI of LCx 2007, afib, CVA x2, OSA not on CPAP, HFpEF, HTN, IBS, BPH, and GERD. He underwent cardiac cath 01/25/20 due to fatigue and dizziness which showed patent stent to circumflex with only 10% in-stent restenosis.  Pt presents today in good spirits. He is taking pravastatin 7m daily, not 494mas our med list states. He is also no longer on HCTZ or Sonata, but is taking mirtazapine. Med list has been updated.  He previously took ReTerrynd was receiving it for free from the manufacturer. He was unable to start therapy this year since the manufacturer stopped offering this program. Repatha is currently a Tier 4 on his [employer] insurance plan ($80/1 month or $160/3 months). He's changing to [employer] next year, anticipate copay would be $47/month after deductible is paid down. He previously took atorvastatin and rosuvastatin. Reports awful muscle pain in his legs on rosuvastatin, is not sure if he had a reaction to atorvastatin but states that his memory is not great so he may have.  Current Medications: pravastatin 8065maily, ezetimibe 14m56mily Intolerances: atorvastatin 20mg22mly, rosuvastatin Risk Factors: MI, CAD s/p PCI, multiple strokes, HTN, age LDL goal: <55mg/68mDiet: Minimal appetite. Likes fish, meatloaf, eggs, mac and cheese, cereal.  Exercise: Less energy as he gets older. Walks his dog 20-30 minutes every day.  Family History: Arthritis in his mother; Cancer in his maternal aunt and mother; Heart Problems in his father; Heart disease in his father, paternal grandfather, and paternal grandmother; Hypertension in his father; Mental illness in his mother; Stroke in his maternal grandmother and paternal uncle; Sudden death in his maternal  grandfather. There is no history of Colon cancer, Esophageal cancer, Rectal cancer, Colon polyps, or Stomach cancer.  Social History: Former smoker, drinks wine daily.  Labs: 03/14/20: TC 227, TG 250, HDL 46, LDL 131 (pravastatin 80mg d66m, ezetimibe 14mg da42m  Past Medical History:  Diagnosis Date  . BPH (benign prostatic hyperplasia)   . Coronary artery disease    a. s/p PCI to LCx in 2007; b. MV 2017 no sig ischemia, EF 59%, nl study  . Depression   . GERD (gastroesophageal reflux disease)   . Hyperlipidemia   . Hypertension   . IBS (irritable bowel syndrome)   . Myocardial infarction ([facility])    [place] yrs ago   . PAF (paroxysmal atrial fibrillation) ([facility])    [place] (HTN, age x 1, stroke x 2, vascular disease); b. on Coumadin   . PNA (pneumonia)   . Skin cancer    face   . Sleep apnea    off cpap   . Stroke ([facility])   [facility] with residual [facility]-sided weakness    Current Outpatient Medications on File Prior to Visit  Medication Sig Dispense Refill  . acetaminophen (TYLENOL) 500 MG tablet Take 1,000 mg by mouth every 6 (six) hours as needed for mild pain.    . amiodaMarland Kitchenone (PACERONE) 100 MG tablet Take 1 tablet (100 mg total) by mouth daily. 90 tablet 3  . amLODipine (NORVASC)  5 MG tablet TAKE 1 TABLET BY MOUTH EVERY DAY FOR BLOOD PRESSURE (Patient taking differently: Take 5 mg by mouth daily with supper. ) 90 tablet 1  . aspirin 81 MG tablet Take 81 mg by mouth at bedtime.     . bisoprolol (ZEBETA) 10 MG tablet TAKE 1 AND 1/2 TABLETS BY MOUTH EVERY DAY FOR BLOOD PRESSURE. (Patient taking differently: Take 15 mg by mouth daily. for blood pressure.) 135 tablet 0  . Cholecalciferol (VITAMIN D3) 50 MCG (2000 UT) TABS Take 2,000 Units by mouth at bedtime.     . DULoxetine (CYMBALTA) 20 MG capsule Take 40 mg by mouth daily.    Marland Kitchen enoxaparin (LOVENOX) 80 MG/0.8ML injection Inject 0.8 mLs (80 mg total) into the skin every 12 (twelve) hours for 10 days. 16 mL 1  . ezetimibe  (ZETIA) 10 MG tablet Take 1 tablet (10 mg total) by mouth daily. 30 tablet 3  . furosemide (LASIX) 20 MG tablet Take 20 mg by mouth daily as needed for fluid or edema.     . hydrochlorothiazide (HYDRODIURIL) 12.5 MG tablet Take 1 tablet (12.5 mg total) by mouth daily. For blood pressure. 90 tablet 3  . lisinopril (ZESTRIL) 40 MG tablet TAKE 1 TABLET (40 MG TOTAL) BY MOUTH DAILY. FOR BLOOD PRESSURE. (Patient taking differently: Take 10 mg by mouth daily. For blood pressure.) 90 tablet 1  . Multiple Vitamin (MULTIVITAMIN) capsule Take 1 capsule by mouth daily.    . Multiple Vitamins-Minerals (MULTIVITAMIN WITH MINERALS) tablet Take 1 tablet by mouth daily. Centrum Silver    . nitroGLYCERIN (NITROSTAT) 0.4 MG SL tablet Place 1 tablet (0.4 mg total) every 5 (five) minutes as needed under the tongue for chest pain. 25 tablet 3  . omeprazole (PRILOSEC) 40 MG capsule TAKE 1 CAPSULE (40 MG TOTAL) BY MOUTH 2 (TWO) TIMES DAILY BEFORE A MEAL. FOR HEARTBURN. (Patient taking differently: Take 40 mg by mouth daily. ) 180 capsule 1  . potassium chloride (KLOR-CON) 10 MEQ tablet Take 10 mEq by mouth daily as needed (low Potassium). Only take with lasix    . pravastatin (PRAVACHOL) 40 MG tablet Take 1 tablet (40 mg total) by mouth daily. For cholesterol. (Patient taking differently: Take 80 mg by mouth daily. For cholesterol.) 90 tablet 3  . triamcinolone cream (KENALOG) 0.1 % Apply 1 application topically 2 (two) times daily. (Patient taking differently: Apply 1 application topically daily as needed (Rash). ) 30 g 0  . warfarin (COUMADIN) 7.5 MG tablet Take 0.5-1 tablets (3.75-7.5 mg total) by mouth See admin instructions. Take 7.2m on sundays, all other days take 3.741mor as directed by coumadin clinic. (Patient taking differently: Take 3.75-7.5 mg by mouth See admin instructions. Take 7.22m3mn Monday,Wednesday and Friday 3.75 mg all the other dayys) 108 tablet 1  . zaleplon (SONATA) 5 MG capsule Take 5 mg by mouth  at bedtime.      Current Facility-Administered Medications on File Prior to Visit  Medication Dose Route Frequency Provider Last Rate Last Admin  . sodium chloride flush (NS) 0.9 % injection 3 mL  3 mL Intravenous Q12H Visser, Jacquelyn D, PA-C        Allergies  Allergen Reactions  . Bactrim [Sulfamethoxazole-Trimethoprim] Anaphylaxis    Ulcers  . Ciprofloxacin Swelling    Tongue  . Crestor [Rosuvastatin Calcium]     Severe Myalgias    Assessment/Plan:  1. Hyperlipidemia - LDL 131 on pravastatin 3m29mily and ezetimibe 10mg56mly, above goal < 55 given  history of extensive ASCVD. Unfortunately, Repatha copay is cost prohibitive for pt and there are currently no grants available to assist with this. Will reach out to pt if grants do open again. Pt does qualify for ORION-4 trial investigating inclisiran injections and is interested in pursuing this. Will reach out to our research team to coordinate screening visit with pt.  Kaneshia Cater E. Yaileen Hofferber, PharmD, BCACP, [place] [address], [place], [place] [address] Phone: [phone]; Fax: [phone] 03/22/2020 4:17 PM

## 2020-03-22 NOTE — Patient Instructions (Addendum)
It was nice to see you today!  Your LDL cholesterol is currently 131 and your goal is < 55  Continue taking pravastatin and ezetimibe for your cholesterol  I'll have a research nurse reach out to you about the trial studying inclisiran injections for cholesterol. It's given every 6 months and lowers your LDL by 50%

## 2020-03-26 ENCOUNTER — Telehealth: Payer: Self-pay | Admitting: Pharmacist

## 2020-03-26 NOTE — Telephone Encounter (Signed)
Received message from research team that pt unfortunately will not qualify for ORION trial due to recent diagnosis of basal cell carcinoma and questionable malignant melanoma.  Advised pt of this and let him know that we will reach out once inclisiran is FDA approved in the next few months. He was appreciative of call.

## 2020-04-01 ENCOUNTER — Other Ambulatory Visit: Payer: Self-pay

## 2020-04-01 ENCOUNTER — Ambulatory Visit (INDEPENDENT_AMBULATORY_CARE_PROVIDER_SITE_OTHER): Payer: PPO

## 2020-04-01 DIAGNOSIS — R002 Palpitations: Secondary | ICD-10-CM

## 2020-04-01 DIAGNOSIS — Z7901 Long term (current) use of anticoagulants: Secondary | ICD-10-CM

## 2020-04-01 DIAGNOSIS — Z5181 Encounter for therapeutic drug level monitoring: Secondary | ICD-10-CM | POA: Diagnosis not present

## 2020-04-01 LAB — POCT INR: INR: 2.1 (ref 2.0–3.0)

## 2020-04-01 NOTE — Patient Instructions (Signed)
-   continue warfarin dosage 1 tablet every day EXCEPT 1/2 tablet on MONDAYS, [place]. - Recheck INR in 4 weeks

## 2020-04-17 DIAGNOSIS — I480 Paroxysmal atrial fibrillation: Secondary | ICD-10-CM | POA: Diagnosis not present

## 2020-04-17 DIAGNOSIS — M542 Cervicalgia: Secondary | ICD-10-CM | POA: Diagnosis not present

## 2020-04-17 DIAGNOSIS — J0100 Acute maxillary sinusitis, unspecified: Secondary | ICD-10-CM | POA: Diagnosis not present

## 2020-04-17 DIAGNOSIS — Z7901 Long term (current) use of anticoagulants: Secondary | ICD-10-CM | POA: Diagnosis not present

## 2020-04-17 DIAGNOSIS — I25119 Atherosclerotic heart disease of native coronary artery with unspecified angina pectoris: Secondary | ICD-10-CM | POA: Diagnosis not present

## 2020-04-23 DIAGNOSIS — M62830 Muscle spasm of back: Secondary | ICD-10-CM | POA: Diagnosis not present

## 2020-04-23 DIAGNOSIS — R293 Abnormal posture: Secondary | ICD-10-CM | POA: Diagnosis not present

## 2020-04-23 DIAGNOSIS — M9901 Segmental and somatic dysfunction of cervical region: Secondary | ICD-10-CM | POA: Diagnosis not present

## 2020-04-23 DIAGNOSIS — M9903 Segmental and somatic dysfunction of lumbar region: Secondary | ICD-10-CM | POA: Diagnosis not present

## 2020-04-24 ENCOUNTER — Other Ambulatory Visit: Payer: Self-pay | Admitting: *Deleted

## 2020-04-24 MED ORDER — EZETIMIBE 10 MG PO TABS: 10.0000 mg | ORAL_TABLET | Freq: Every day | ORAL | 2 refills | Status: DC

## 2020-04-25 DIAGNOSIS — R293 Abnormal posture: Secondary | ICD-10-CM | POA: Diagnosis not present

## 2020-04-25 DIAGNOSIS — M9901 Segmental and somatic dysfunction of cervical region: Secondary | ICD-10-CM | POA: Diagnosis not present

## 2020-04-25 DIAGNOSIS — M9903 Segmental and somatic dysfunction of lumbar region: Secondary | ICD-10-CM | POA: Diagnosis not present

## 2020-04-25 DIAGNOSIS — M62830 Muscle spasm of back: Secondary | ICD-10-CM | POA: Diagnosis not present

## 2020-04-29 ENCOUNTER — Other Ambulatory Visit: Payer: Self-pay

## 2020-04-29 ENCOUNTER — Ambulatory Visit (INDEPENDENT_AMBULATORY_CARE_PROVIDER_SITE_OTHER): Payer: PPO

## 2020-04-29 DIAGNOSIS — M9903 Segmental and somatic dysfunction of lumbar region: Secondary | ICD-10-CM | POA: Diagnosis not present

## 2020-04-29 DIAGNOSIS — Z7901 Long term (current) use of anticoagulants: Secondary | ICD-10-CM | POA: Diagnosis not present

## 2020-04-29 DIAGNOSIS — R002 Palpitations: Secondary | ICD-10-CM | POA: Diagnosis not present

## 2020-04-29 DIAGNOSIS — M9901 Segmental and somatic dysfunction of cervical region: Secondary | ICD-10-CM | POA: Diagnosis not present

## 2020-04-29 DIAGNOSIS — R293 Abnormal posture: Secondary | ICD-10-CM | POA: Diagnosis not present

## 2020-04-29 DIAGNOSIS — M62830 Muscle spasm of back: Secondary | ICD-10-CM | POA: Diagnosis not present

## 2020-04-29 DIAGNOSIS — Z5181 Encounter for therapeutic drug level monitoring: Secondary | ICD-10-CM | POA: Diagnosis not present

## 2020-04-29 LAB — POCT INR: INR: 2.7 (ref 2.0–3.0)

## 2020-04-29 NOTE — Patient Instructions (Signed)
-   continue warfarin dosage 1 tablet every day EXCEPT 1/2 tablet on MONDAYS, [place]. - Recheck INR in 5 weeks

## 2020-05-06 ENCOUNTER — Encounter: Payer: Self-pay | Admitting: Emergency Medicine

## 2020-05-06 ENCOUNTER — Emergency Department: Payer: PPO

## 2020-05-06 ENCOUNTER — Emergency Department
Admission: EM | Admit: 2020-05-06 | Discharge: 2020-05-06 | Disposition: A | Discharge status: Home / Self Care | Payer: PPO | Attending: Emergency Medicine | Admitting: Emergency Medicine

## 2020-05-06 ENCOUNTER — Other Ambulatory Visit: Payer: Self-pay

## 2020-05-06 DIAGNOSIS — J00 Acute nasopharyngitis [common cold]: Secondary | ICD-10-CM | POA: Diagnosis not present

## 2020-05-06 DIAGNOSIS — R918 Other nonspecific abnormal finding of lung field: Secondary | ICD-10-CM | POA: Diagnosis not present

## 2020-05-06 DIAGNOSIS — Z85828 Personal history of other malignant neoplasm of skin: Secondary | ICD-10-CM | POA: Diagnosis not present

## 2020-05-06 DIAGNOSIS — Z87891 Personal history of nicotine dependence: Secondary | ICD-10-CM | POA: Diagnosis not present

## 2020-05-06 DIAGNOSIS — Z79899 Other long term (current) drug therapy: Secondary | ICD-10-CM | POA: Diagnosis not present

## 2020-05-06 DIAGNOSIS — I10 Essential (primary) hypertension: Secondary | ICD-10-CM | POA: Insufficient documentation

## 2020-05-06 DIAGNOSIS — H9203 Otalgia, bilateral: Secondary | ICD-10-CM | POA: Insufficient documentation

## 2020-05-06 DIAGNOSIS — R059 Cough, unspecified: Secondary | ICD-10-CM | POA: Diagnosis present

## 2020-05-06 DIAGNOSIS — Z7982 Long term (current) use of aspirin: Secondary | ICD-10-CM | POA: Insufficient documentation

## 2020-05-06 DIAGNOSIS — R079 Chest pain, unspecified: Secondary | ICD-10-CM | POA: Diagnosis not present

## 2020-05-06 DIAGNOSIS — J029 Acute pharyngitis, unspecified: Secondary | ICD-10-CM | POA: Diagnosis not present

## 2020-05-06 DIAGNOSIS — I2510 Atherosclerotic heart disease of native coronary artery without angina pectoris: Secondary | ICD-10-CM | POA: Insufficient documentation

## 2020-05-06 DIAGNOSIS — Z955 Presence of coronary angioplasty implant and graft: Secondary | ICD-10-CM | POA: Diagnosis not present

## 2020-05-06 DIAGNOSIS — J449 Chronic obstructive pulmonary disease, unspecified: Secondary | ICD-10-CM | POA: Diagnosis not present

## 2020-05-06 DIAGNOSIS — Z7901 Long term (current) use of anticoagulants: Secondary | ICD-10-CM | POA: Diagnosis not present

## 2020-05-06 DIAGNOSIS — Z20822 Contact with and (suspected) exposure to covid-19: Secondary | ICD-10-CM | POA: Insufficient documentation

## 2020-05-06 LAB — CBC
HCT: 41.6 % (ref 39.0–52.0)
Hemoglobin: 14.1 g/dL (ref 13.0–17.0)
MCH: 31.7 pg (ref 26.0–34.0)
MCHC: 33.9 g/dL (ref 30.0–36.0)
MCV: 93.5 fL (ref 80.0–100.0)
Platelets: 194 10*3/uL (ref 150–400)
RBC: 4.45 MIL/uL (ref 4.22–5.81)
RDW: 13.6 % (ref 11.5–15.5)
WBC: 11.4 10*3/uL — ABNORMAL HIGH (ref 4.0–10.5)
nRBC: 0 % (ref 0.0–0.2)

## 2020-05-06 LAB — BASIC METABOLIC PANEL
Anion gap: 9 (ref 5–15)
BUN: 13 mg/dL (ref 8–23)
CO2: 28 mmol/L (ref 22–32)
Calcium: 9 mg/dL (ref 8.9–10.3)
Chloride: 101 mmol/L (ref 98–111)
Creatinine, Ser: 1.07 mg/dL (ref 0.61–1.24)
GFR, Estimated: >60 mL/min (ref >60)
Glucose, Bld: 139 mg/dL — ABNORMAL HIGH (ref 70–99)
Potassium: 4.1 mmol/L (ref 3.5–5.1)
Sodium: 138 mmol/L (ref 135–145)

## 2020-05-06 LAB — RESP PANEL BY RT-PCR (FLU A&B, COVID) ARPGX2
Influenza A by PCR: NEGATIVE
Influenza B by PCR: NEGATIVE
SARS Coronavirus 2 by RT PCR: NEGATIVE

## 2020-05-06 LAB — TROPONIN I (HIGH SENSITIVITY): Troponin I (High Sensitivity): 10 ng/L (ref <18)

## 2020-05-06 MED ORDER — AMOXICILLIN-POT CLAVULANATE 875-125 MG PO TABS: 1.0000 | ORAL_TABLET | Freq: Two times a day (BID) | ORAL | 0 refills | Status: DC

## 2020-05-06 NOTE — ED Provider Notes (Signed)
[facility] Emergency Department Provider Note   ____________________________________________   First MD Initiated Contact with Patient 05/06/20 1159     (approximate)  I have reviewed the triage vital signs and the nursing notes.   HISTORY  Chief Complaint Chest Pain and Cough    HPI [name] is a 74 y.o. male with a stated past medical history of paroxysmal atrial fibrillation on warfarin, unstable angina, and stroke in the past who presents for cough, sore throat, earache, and chest pain.  Patient states that she symptoms began as a dry cough and nasal congestion 2 weeks prior to arrival.  Since that time patient states that approximately 5 days prior to arrival symptoms began severely worsening and were associated with a low-grade fever.  Patient describes nasal congestion, a dull throbbing headache, cough productive of clear mucus, and "eustachian tube pain" that is worse on the right than on the left.  Patient describes this chest pain as an aching, left parasternal pain that is worsened with movement or sitting up as well as with coughing and partially relieved at rest.  Patient denies any recent travel or sick contacts         Past Medical History:  Diagnosis Date  . BPH (benign prostatic hyperplasia)   . Coronary artery disease    a. s/p PCI to LCx in 2007; b. MV 2017 no sig ischemia, EF 59%, nl study  . Depression   . GERD (gastroesophageal reflux disease)   . Hyperlipidemia   . Hypertension   . IBS (irritable bowel syndrome)   . Myocardial infarction ([place])    12-13 yrs ago   . PAF (paroxysmal atrial fibrillation) ([place])    a. CHADS2VASc => 5 (HTN, age x 1, stroke x 2, vascular disease); b. on Coumadin   . PNA (pneumonia)   . Skin cancer    face   . Sleep apnea    off cpap   . Stroke [facility])    a. x 2 with residual left-sided weakness    Patient Active Problem List   Diagnosis Date Noted  . Unstable angina ([place]) 01/25/2020  .  Statin myopathy 06/07/2019  . Dysuria 04/21/2019  . Right elbow pain 12/21/2018  . Left-sided weakness 08/12/2018  . Chronic obstructive pulmonary disease ([place]) 06/13/2018  . Abdominal pain 05/27/2018  . Wrist pain, acute, right 11/05/2017  . Sleep apnea 09/16/2017  . Encounter for current long-term use of anticoagulants 07/24/2017  . Fall 07/21/2017  . Constipation 07/21/2017  . Chronic fatigue 06/18/2017  . Tremor 06/18/2017  . Eczema 06/18/2017  . Testicular atrophy 04/23/2017  . Dyspnea on exertion 03/16/2017  . Erectile dysfunction 03/05/2017  . Preventative health care 02/11/2016  . Palpitations 12/09/2015  . Anxiety and depression 02/08/2015  . GERD (gastroesophageal reflux disease) 02/08/2015  . IBS (irritable bowel syndrome) 02/08/2015  . Essential hypertension 02/08/2015  . CAD (coronary artery disease) 01/23/2015  . Hyperlipidemia 01/23/2015    Past Surgical History:  Procedure Laterality Date  . ADENOIDECTOMY  1951  . CARDIAC CATHETERIZATION  2006   X1 STENT  . COLONOSCOPY    . CORONARY ANGIOPLASTY    . POLYPECTOMY    . RIGHT/LEFT HEART CATH AND CORONARY ANGIOGRAPHY N/A 01/25/2020   Procedure: RIGHT/LEFT HEART CATH AND CORONARY ANGIOGRAPHY;  Surgeon: Minna Merritts, MD;  Location: [facility];  Service: Cardiovascular;  Laterality: N/A;  . SKIN CANCER EXCISION N/A 08/2016   [place].  [facility] (Dr. Deliah Boston)  .  THYROIDECTOMY, PARTIAL    . UPPER GASTROINTESTINAL ENDOSCOPY    . VASCULAR SURGERY     varicose vein stripping left leg    Prior to Admission medications   Medication Sig Start Date End Date Taking? Authorizing Provider  acetaminophen (TYLENOL) 500 MG tablet Take 1,000 mg by mouth every 6 (six) hours as needed for mild pain.    [provider]  amiodarone (PACERONE) 100 MG tablet Take 1 tablet (100 mg total) by mouth daily. 01/29/20   Minna Merritts, MD  amLODipine (NORVASC) 5 MG tablet TAKE 1 TABLET BY MOUTH  EVERY DAY FOR BLOOD PRESSURE 06/12/19   Pleas Koch, NP  amoxicillin-clavulanate (AUGMENTIN) 875-125 MG tablet Take 1 tablet by mouth 2 (two) times daily for 7 days. 05/06/20 05/13/20  Naaman Plummer, MD  aspirin 81 MG tablet Take 81 mg by mouth at bedtime.     [provider]  bisoprolol (ZEBETA) 10 MG tablet TAKE 1 AND 1/2 TABLETS BY MOUTH EVERY DAY FOR BLOOD PRESSURE. 10/04/19   Pleas Koch, NP  Cholecalciferol (VITAMIN D3) 50 MCG (2000 UT) TABS Take 2,000 Units by mouth at bedtime.     [provider]  DULoxetine (CYMBALTA) 20 MG capsule Take 40 mg by mouth daily.    [provider]  ezetimibe (ZETIA) 10 MG tablet Take 1 tablet (10 mg total) by mouth daily. 04/24/20 07/23/20  Loel Dubonnet, NP  furosemide (LASIX) 20 MG tablet Take 20 mg by mouth daily as needed for fluid or edema.     [provider]  lisinopril (ZESTRIL) 40 MG tablet TAKE 1 TABLET (40 MG TOTAL) BY MOUTH DAILY. FOR BLOOD PRESSURE. 09/25/19   Pleas Koch, NP  mirtazapine (REMERON) 15 MG tablet Take 15 mg by mouth at bedtime.    [provider]  Multiple Vitamins-Minerals (MULTIVITAMIN WITH MINERALS) tablet Take 1 tablet by mouth daily. Centrum Silver    [provider]  nitroGLYCERIN (NITROSTAT) 0.4 MG SL tablet Place 1 tablet (0.4 mg total) every 5 (five) minutes as needed under the tongue for chest pain. 04/16/17   Leone Haven, MD  omeprazole (PRILOSEC) 40 MG capsule TAKE 1 CAPSULE (40 MG TOTAL) BY MOUTH 2 (TWO) TIMES DAILY BEFORE A MEAL. FOR HEARTBURN. Patient taking differently: Take 40 mg by mouth in the morning and at bedtime.  01/01/20   Thornton Park, MD  potassium chloride (KLOR-CON) 10 MEQ tablet Take 10 mEq by mouth daily as needed (low Potassium). Only take with lasix    [provider]  pravastatin (PRAVACHOL) 80 MG tablet Take 1 tablet (80 mg total) by mouth daily. For cholesterol. 03/22/20   Minna Merritts, MD   triamcinolone cream (KENALOG) 0.1 % Apply 1 application topically 2 (two) times daily. Patient taking differently: Apply 1 application topically daily as needed (Rash).  01/19/19   Pleas Koch, NP  warfarin (COUMADIN) 7.5 MG tablet Take 0.5-1 tablets (3.75-7.5 mg total) by mouth See admin instructions. Take 7.5mg  on sundays, all other days take 3.75mg  or as directed by coumadin clinic. Patient taking differently: Take 3.75-7.5 mg by mouth See admin instructions. Take 7.5mg  on Monday,Wednesday and Friday 3.75 mg all the other dayys 11/02/18   Pleas Koch, NP    Allergies Bactrim [sulfamethoxazole-trimethoprim], Ciprofloxacin, and Crestor [rosuvastatin calcium]  Family History  Problem Relation Age of Onset  . Heart Problems Father   . Heart disease Father   . Hypertension Father   . Mental illness  Mother   . Arthritis Mother   . Cancer Mother        ovarian  . Stroke Paternal Uncle   . Heart disease Paternal Grandfather   . Cancer Maternal Aunt   . Stroke Maternal Grandmother   . Sudden death Maternal Grandfather   . Heart disease Paternal Grandmother   . Colon cancer Neg Hx   . Esophageal cancer Neg Hx   . Rectal cancer Neg Hx   . Colon polyps Neg Hx   . Stomach cancer Neg Hx     Social History Social History   Tobacco Use  . Smoking status: Former Research scientist (life sciences)  . Smokeless tobacco: Never Used  Vaping Use  . Vaping Use: Never used  Substance Use Topics  . Alcohol use: Yes    Alcohol/week: 2.0 - 3.0 standard drinks    Types: 2 - 3 Glasses of wine per week    Comment: Wine daily with dinner most days  . Drug use: No    Review of Systems Constitutional: No fever/chills Eyes: No visual changes. ENT: Endorses sore throat. Cardiovascular: Endorses chest pain. Respiratory: Denies shortness of breath. Gastrointestinal: No abdominal pain.  No nausea, no vomiting.  No diarrhea. Genitourinary: Negative for dysuria. Musculoskeletal: Negative for acute  arthralgias Skin: Negative for rash. Neurological: Endorses for headaches, negative for weakness/numbness/paresthesias in any extremity Psychiatric: Negative for suicidal ideation/homicidal ideation   ____________________________________________   PHYSICAL EXAM:  VITAL SIGNS: ED Triage Vitals  Enc Vitals Group     BP 05/06/20 1016 130/69     Pulse Rate 05/06/20 1016 76     Resp 05/06/20 1016 20     Temp 05/06/20 1016 99 F (37.2 C)     Temp Source 05/06/20 1016 Oral     SpO2 05/06/20 1016 96 %     Weight 05/06/20 1013 189 lb (85.7 kg)     Height 05/06/20 1013 6' (1.829 m)     Head Circumference --      Peak Flow --      Pain Score 05/06/20 1013 7     Pain Loc --      Pain Edu? --      Excl. in [place]? --    Constitutional: Alert and oriented. Well appearing and in no acute distress. Eyes: Conjunctivae are normal. PERRL. Ears: Erythematous tympanic membranes bilaterally with mild bulging on the right Head: Atraumatic. Nose: No congestion/rhinnorhea. Mouth/Throat: Mucous membranes are moist.  Erythematous posterior oropharynx Neck: No stridor Cardiovascular: Grossly normal heart sounds.  Good peripheral circulation. Respiratory: Normal respiratory effort.  No retractions. Gastrointestinal: Soft and nontender. No distention. Musculoskeletal: No obvious deformities Neurologic:  Normal speech and language. No gross focal neurologic deficits are appreciated. Skin:  Skin is warm and dry. No rash noted. Psychiatric: Mood and affect are normal. Speech and behavior are normal.  ____________________________________________   LABS (all labs ordered are listed, but only abnormal results are displayed)  Labs Reviewed  BASIC METABOLIC PANEL - Abnormal; Notable for the following components:      Result Value   Glucose, Bld 139 (*)    All other components within normal limits  CBC - Abnormal; Notable for the following components:   WBC 11.4 (*)    All other components within  normal limits  RESP PANEL BY RT-PCR (FLU A&B, COVID) [id number]  TROPONIN I (HIGH SENSITIVITY)   ____________________________________________  EKG  ED ECG REPORT I, Naaman Plummer, the attending physician, personally viewed and interpreted this ECG.  Date:  05/06/2020 EKG Time: 1009 Rate: 77 Rhythm: normal sinus rhythm QRS Axis: normal Intervals: normal ST/T Wave abnormalities: normal Narrative Interpretation: no evidence of acute ischemia  ____________________________________________  RADIOLOGY  ED MD interpretation: 2 view chest x-ray shows mild left basilar opacity.  This could represent early pneumonia versus atelectasis or aspiration.  No evidence of pneumothorax or widened mediastinum  Official radiology report(s): DG Chest 2 View  Result Date: 05/06/2020 CLINICAL DATA:  Chest pain.  Shortness of breath. EXAM: CHEST - 2 VIEW COMPARISON:  None. FINDINGS: The heart size and mediastinal contours are within normal limits. Mild left basilar opacities. No visible pleural effusions or pneumothorax. No acute osseous abnormality. IMPRESSION: Mild left basilar opacities, which may represent atelectasis, aspiration and/or pneumonia. Electronically Signed   By: Margaretha Sheffield MD   On: 05/06/2020 10:52    ____________________________________________   PROCEDURES  Procedure(s) performed (including Critical Care):  .1-3 Lead EKG Interpretation Performed by: Naaman Plummer, MD Authorized by: Naaman Plummer, MD     Interpretation: normal     ECG rate:  78   ECG rate assessment: normal     Rhythm: sinus rhythm     Ectopy: none     Conduction: normal       ____________________________________________   INITIAL IMPRESSION / ASSESSMENT AND PLAN / ED COURSE  As part of my medical decision making, I reviewed the following data within the [place] notes reviewed and incorporated, Labs reviewed, EKG interpreted, Old chart reviewed, Radiograph  reviewed and Notes from prior ED visits reviewed and incorporated     Patient is a 74 year old male with the above-stated medical history who presents for upper respiratory infectious symptoms including sore throat, cough, earache, and now pleuritic chest pain. No history of immunocompromise. Nontoxic appearance. Patient euvolemic with no trismus. No airway compromise. Able to tolerate PO.  Given History and Exam I have low suspicion for this presentation being caused by PTA, RPA, [place], Epiglottitis or Bacterial Tracheitis, EBV, acute HIV, Strep throat. Given symptom duration as well as possible early otitis media on the right, it is reasonable to cover patient with empiric antibiotics. Rx: augmentin Disposition: Discharge home with prompt outpatient PCP follow up; return precautions discussed.       ____________________________________________   FINAL CLINICAL IMPRESSION(S) / ED DIAGNOSES  Final diagnoses:  Acute nasopharyngitis  Cough  Sore throat  Earache symptoms in both ears     ED Discharge Orders         Ordered    amoxicillin-clavulanate (AUGMENTIN) 875-125 MG tablet  2 times daily        05/06/20 1232           Note:  This document was prepared using Dragon voice recognition software and may include unintentional dictation errors.   Naaman Plummer, MD 05/06/20 1233

## 2020-05-06 NOTE — ED Notes (Addendum)
Pt reports sore throat starting Wednesday, which has decreased since then. Pt states "I also feel like my eustachian tubes are stopped up."  Pt reports "low grade fever, around 100" at home, which he's been taking tylenol for. Last tylenol last night.   Pt reports fatigue and chills

## 2020-05-06 NOTE — ED Notes (Signed)
Pt reports cough since Thursday. Pt saw his doctor on Friday to "try and get some antibiotics," but "it didn't work out that way." Pt states chest pain began over the weekend when cough worsened. Pt reports chest pain only with cough. Cough is congested, nonproductive.   Pt states he's had increased sinus drainage since October. Pt states he was put on a steroids two weeks ago (he has finished the course by now), and he does report an increased INR because of it. Pt is on warfarin.

## 2020-05-08 ENCOUNTER — Emergency Department (HOSPITAL_COMMUNITY): Payer: PPO

## 2020-05-08 ENCOUNTER — Other Ambulatory Visit: Payer: Self-pay

## 2020-05-08 ENCOUNTER — Inpatient Hospital Stay (HOSPITAL_COMMUNITY)
Admission: EM | Admit: 2020-05-08 | Discharge: 2020-05-12 | DRG: 191 | Disposition: A | Discharge status: Home / Self Care | Payer: PPO | Attending: Internal Medicine | Admitting: Internal Medicine

## 2020-05-08 DIAGNOSIS — G4733 Obstructive sleep apnea (adult) (pediatric): Secondary | ICD-10-CM | POA: Diagnosis not present

## 2020-05-08 DIAGNOSIS — Z87892 Personal history of anaphylaxis: Secondary | ICD-10-CM | POA: Diagnosis not present

## 2020-05-08 DIAGNOSIS — I69354 Hemiplegia and hemiparesis following cerebral infarction affecting left non-dominant side: Secondary | ICD-10-CM

## 2020-05-08 DIAGNOSIS — Z20822 Contact with and (suspected) exposure to covid-19: Secondary | ICD-10-CM | POA: Diagnosis present

## 2020-05-08 DIAGNOSIS — S0083XA Contusion of other part of head, initial encounter: Secondary | ICD-10-CM | POA: Diagnosis present

## 2020-05-08 DIAGNOSIS — K219 Gastro-esophageal reflux disease without esophagitis: Secondary | ICD-10-CM | POA: Diagnosis not present

## 2020-05-08 DIAGNOSIS — Z79899 Other long term (current) drug therapy: Secondary | ICD-10-CM

## 2020-05-08 DIAGNOSIS — I10 Essential (primary) hypertension: Secondary | ICD-10-CM | POA: Diagnosis present

## 2020-05-08 DIAGNOSIS — J45909 Unspecified asthma, uncomplicated: Secondary | ICD-10-CM | POA: Diagnosis not present

## 2020-05-08 DIAGNOSIS — S0003XA Contusion of scalp, initial encounter: Secondary | ICD-10-CM | POA: Diagnosis not present

## 2020-05-08 DIAGNOSIS — K589 Irritable bowel syndrome without diarrhea: Secondary | ICD-10-CM | POA: Diagnosis not present

## 2020-05-08 DIAGNOSIS — J449 Chronic obstructive pulmonary disease, unspecified: Secondary | ICD-10-CM | POA: Diagnosis present

## 2020-05-08 DIAGNOSIS — R069 Unspecified abnormalities of breathing: Secondary | ICD-10-CM | POA: Diagnosis not present

## 2020-05-08 DIAGNOSIS — E785 Hyperlipidemia, unspecified: Secondary | ICD-10-CM | POA: Diagnosis not present

## 2020-05-08 DIAGNOSIS — I252 Old myocardial infarction: Secondary | ICD-10-CM | POA: Diagnosis not present

## 2020-05-08 DIAGNOSIS — I6782 Cerebral ischemia: Secondary | ICD-10-CM | POA: Diagnosis not present

## 2020-05-08 DIAGNOSIS — J0120 Acute ethmoidal sinusitis, unspecified: Secondary | ICD-10-CM | POA: Diagnosis not present

## 2020-05-08 DIAGNOSIS — Z7982 Long term (current) use of aspirin: Secondary | ICD-10-CM

## 2020-05-08 DIAGNOSIS — I2510 Atherosclerotic heart disease of native coronary artery without angina pectoris: Secondary | ICD-10-CM | POA: Diagnosis not present

## 2020-05-08 DIAGNOSIS — F419 Anxiety disorder, unspecified: Secondary | ICD-10-CM | POA: Diagnosis not present

## 2020-05-08 DIAGNOSIS — F32A Depression, unspecified: Secondary | ICD-10-CM | POA: Diagnosis not present

## 2020-05-08 DIAGNOSIS — I480 Paroxysmal atrial fibrillation: Secondary | ICD-10-CM | POA: Diagnosis not present

## 2020-05-08 DIAGNOSIS — Z823 Family history of stroke: Secondary | ICD-10-CM

## 2020-05-08 DIAGNOSIS — G4730 Sleep apnea, unspecified: Secondary | ICD-10-CM | POA: Diagnosis present

## 2020-05-08 DIAGNOSIS — J189 Pneumonia, unspecified organism: Secondary | ICD-10-CM | POA: Diagnosis not present

## 2020-05-08 DIAGNOSIS — Z8249 Family history of ischemic heart disease and other diseases of the circulatory system: Secondary | ICD-10-CM

## 2020-05-08 DIAGNOSIS — Z85828 Personal history of other malignant neoplasm of skin: Secondary | ICD-10-CM | POA: Diagnosis not present

## 2020-05-08 DIAGNOSIS — Z8673 Personal history of transient ischemic attack (TIA), and cerebral infarction without residual deficits: Secondary | ICD-10-CM

## 2020-05-08 DIAGNOSIS — W19XXXA Unspecified fall, initial encounter: Secondary | ICD-10-CM | POA: Diagnosis present

## 2020-05-08 DIAGNOSIS — J441 Chronic obstructive pulmonary disease with (acute) exacerbation: Secondary | ICD-10-CM | POA: Diagnosis not present

## 2020-05-08 DIAGNOSIS — J9600 Acute respiratory failure, unspecified whether with hypoxia or hypercapnia: Secondary | ICD-10-CM | POA: Diagnosis not present

## 2020-05-08 DIAGNOSIS — N400 Enlarged prostate without lower urinary tract symptoms: Secondary | ICD-10-CM | POA: Diagnosis present

## 2020-05-08 DIAGNOSIS — J40 Bronchitis, not specified as acute or chronic: Secondary | ICD-10-CM

## 2020-05-08 DIAGNOSIS — Z66 Do not resuscitate: Secondary | ICD-10-CM | POA: Diagnosis not present

## 2020-05-08 DIAGNOSIS — Z818 Family history of other mental and behavioral disorders: Secondary | ICD-10-CM

## 2020-05-08 DIAGNOSIS — Z955 Presence of coronary angioplasty implant and graft: Secondary | ICD-10-CM | POA: Diagnosis not present

## 2020-05-08 DIAGNOSIS — Z7901 Long term (current) use of anticoagulants: Secondary | ICD-10-CM | POA: Diagnosis not present

## 2020-05-08 DIAGNOSIS — G319 Degenerative disease of nervous system, unspecified: Secondary | ICD-10-CM | POA: Diagnosis not present

## 2020-05-08 DIAGNOSIS — R58 Hemorrhage, not elsewhere classified: Secondary | ICD-10-CM | POA: Diagnosis not present

## 2020-05-08 DIAGNOSIS — S0990XA Unspecified injury of head, initial encounter: Secondary | ICD-10-CM | POA: Diagnosis not present

## 2020-05-08 DIAGNOSIS — B974 Respiratory syncytial virus as the cause of diseases classified elsewhere: Secondary | ICD-10-CM | POA: Diagnosis present

## 2020-05-08 DIAGNOSIS — J45901 Unspecified asthma with (acute) exacerbation: Secondary | ICD-10-CM

## 2020-05-08 DIAGNOSIS — Z87891 Personal history of nicotine dependence: Secondary | ICD-10-CM

## 2020-05-08 DIAGNOSIS — R0602 Shortness of breath: Secondary | ICD-10-CM | POA: Diagnosis not present

## 2020-05-08 DIAGNOSIS — Z9119 Patient's noncompliance with other medical treatment and regimen: Secondary | ICD-10-CM

## 2020-05-08 DIAGNOSIS — Z8701 Personal history of pneumonia (recurrent): Secondary | ICD-10-CM

## 2020-05-08 DIAGNOSIS — Z881 Allergy status to other antibiotic agents status: Secondary | ICD-10-CM

## 2020-05-08 LAB — RESP PANEL BY RT-PCR (FLU A&B, COVID) ARPGX2
Influenza A by PCR: NEGATIVE
Influenza B by PCR: NEGATIVE
SARS Coronavirus 2 by RT PCR: NEGATIVE

## 2020-05-08 LAB — CBC WITH DIFFERENTIAL/PLATELET
Abs Immature Granulocytes: 0.02 10*3/uL (ref 0.00–0.07)
Basophils Absolute: 0 10*3/uL (ref 0.0–0.1)
Basophils Relative: 0 %
Eosinophils Absolute: 0.1 10*3/uL (ref 0.0–0.5)
Eosinophils Relative: 1 %
HCT: 40.2 % (ref 39.0–52.0)
Hemoglobin: 13.1 g/dL (ref 13.0–17.0)
Immature Granulocytes: 0 %
Lymphocytes Relative: 18 %
Lymphs Abs: 1.1 10*3/uL (ref 0.7–4.0)
MCH: 31.2 pg (ref 26.0–34.0)
MCHC: 32.6 g/dL (ref 30.0–36.0)
MCV: 95.7 fL (ref 80.0–100.0)
Monocytes Absolute: 0.8 10*3/uL (ref 0.1–1.0)
Monocytes Relative: 12 %
Neutro Abs: 4.2 10*3/uL (ref 1.7–7.7)
Neutrophils Relative %: 69 %
Platelets: 162 10*3/uL (ref 150–400)
RBC: 4.2 MIL/uL — ABNORMAL LOW (ref 4.22–5.81)
RDW: 13.7 % (ref 11.5–15.5)
WBC: 6.2 10*3/uL (ref 4.0–10.5)
nRBC: 0 % (ref 0.0–0.2)

## 2020-05-08 LAB — BASIC METABOLIC PANEL
Anion gap: 13 (ref 5–15)
BUN: 7 mg/dL — ABNORMAL LOW (ref 8–23)
CO2: 22 mmol/L (ref 22–32)
Calcium: 9 mg/dL (ref 8.9–10.3)
Chloride: 103 mmol/L (ref 98–111)
Creatinine, Ser: 0.97 mg/dL (ref 0.61–1.24)
GFR, Estimated: >60 mL/min (ref >60)
Glucose, Bld: 124 mg/dL — ABNORMAL HIGH (ref 70–99)
Potassium: 3.7 mmol/L (ref 3.5–5.1)
Sodium: 138 mmol/L (ref 135–145)

## 2020-05-08 LAB — BRAIN NATRIURETIC PEPTIDE: B Natriuretic Peptide: 178.9 pg/mL — ABNORMAL HIGH (ref 0.0–100.0)

## 2020-05-08 LAB — PROTIME-INR
INR: 2 — ABNORMAL HIGH (ref 0.8–1.2)
Prothrombin Time: 21.7 seconds — ABNORMAL HIGH (ref 11.4–15.2)

## 2020-05-08 LAB — TROPONIN I (HIGH SENSITIVITY)
Troponin I (High Sensitivity): 8 ng/L (ref <18)
Troponin I (High Sensitivity): 6 ng/L (ref <18)

## 2020-05-08 MED ORDER — MIRTAZAPINE 15 MG PO TABS
15.0000 mg | ORAL_TABLET | Freq: Every day | ORAL | Status: DC
  Administered 2020-05-08 – 2020-05-11 (×4): 15 mg via ORAL
  Filled 2020-05-08 (×5): qty 1

## 2020-05-08 MED ORDER — WARFARIN SODIUM 2.5 MG PO TABS
3.7500 mg | ORAL_TABLET | Freq: Once | ORAL | Status: AC
  Administered 2020-05-08: 3.75 mg via ORAL
  Filled 2020-05-08: qty 1

## 2020-05-08 MED ORDER — LISINOPRIL 40 MG PO TABS
40.0000 mg | ORAL_TABLET | Freq: Every day | ORAL | Status: DC
  Administered 2020-05-09 – 2020-05-12 (×4): 40 mg via ORAL
  Filled 2020-05-08: qty 1
  Filled 2020-05-08: qty 2
  Filled 2020-05-08 (×2): qty 1

## 2020-05-08 MED ORDER — WARFARIN - PHARMACIST DOSING INPATIENT: Freq: Every day | Status: DC

## 2020-05-08 MED ORDER — PANTOPRAZOLE SODIUM 40 MG PO TBEC
40.0000 mg | DELAYED_RELEASE_TABLET | Freq: Every day | ORAL | Status: DC
  Administered 2020-05-09 – 2020-05-12 (×4): 40 mg via ORAL
  Filled 2020-05-08 (×5): qty 1

## 2020-05-08 MED ORDER — ALBUTEROL SULFATE (2.5 MG/3ML) 0.083% IN NEBU
2.5000 mg | INHALATION_SOLUTION | RESPIRATORY_TRACT | Status: DC | PRN
  Administered 2020-05-10: 2.5 mg via RESPIRATORY_TRACT
  Filled 2020-05-08 (×2): qty 3

## 2020-05-08 MED ORDER — EZETIMIBE 10 MG PO TABS
10.0000 mg | ORAL_TABLET | Freq: Every day | ORAL | Status: DC
  Administered 2020-05-09 – 2020-05-12 (×4): 10 mg via ORAL
  Filled 2020-05-08 (×4): qty 1

## 2020-05-08 MED ORDER — SODIUM CHLORIDE 0.9% FLUSH
3.0000 mL | Freq: Two times a day (BID) | INTRAVENOUS | Status: DC
  Administered 2020-05-08 – 2020-05-12 (×8): 3 mL via INTRAVENOUS

## 2020-05-08 MED ORDER — IPRATROPIUM-ALBUTEROL 0.5-2.5 (3) MG/3ML IN SOLN
3.0000 mL | Freq: Once | RESPIRATORY_TRACT | Status: AC
  Administered 2020-05-08: 3 mL via RESPIRATORY_TRACT
  Filled 2020-05-08: qty 3

## 2020-05-08 MED ORDER — PRAVASTATIN SODIUM 40 MG PO TABS
80.0000 mg | ORAL_TABLET | Freq: Every day | ORAL | Status: DC
  Administered 2020-05-09 – 2020-05-12 (×4): 80 mg via ORAL
  Filled 2020-05-08 (×4): qty 2

## 2020-05-08 MED ORDER — DULOXETINE HCL 20 MG PO CPEP
40.0000 mg | ORAL_CAPSULE | Freq: Every day | ORAL | Status: DC
  Administered 2020-05-09 – 2020-05-12 (×4): 40 mg via ORAL
  Filled 2020-05-08 (×4): qty 2

## 2020-05-08 MED ORDER — ASPIRIN EC 81 MG PO TBEC
81.0000 mg | DELAYED_RELEASE_TABLET | Freq: Every day | ORAL | Status: DC
  Administered 2020-05-08 – 2020-05-11 (×4): 81 mg via ORAL
  Filled 2020-05-08 (×4): qty 1

## 2020-05-08 MED ORDER — ALBUTEROL SULFATE HFA 108 (90 BASE) MCG/ACT IN AERS
4.0000 | INHALATION_SPRAY | Freq: Once | RESPIRATORY_TRACT | Status: AC
  Administered 2020-05-08: 4 via RESPIRATORY_TRACT
  Filled 2020-05-08: qty 6.7

## 2020-05-08 MED ORDER — BISOPROLOL FUMARATE 10 MG PO TABS
20.0000 mg | ORAL_TABLET | Freq: Every day | ORAL | Status: DC
  Administered 2020-05-09 – 2020-05-12 (×4): 20 mg via ORAL
  Filled 2020-05-08 (×4): qty 2

## 2020-05-08 MED ORDER — ACETAMINOPHEN 650 MG RE SUPP: 650.0000 mg | Freq: Four times a day (QID) | RECTAL | Status: DC | PRN

## 2020-05-08 MED ORDER — AMLODIPINE BESYLATE 5 MG PO TABS
5.0000 mg | ORAL_TABLET | Freq: Every day | ORAL | Status: DC
  Administered 2020-05-09 – 2020-05-12 (×4): 5 mg via ORAL
  Filled 2020-05-08 (×4): qty 1

## 2020-05-08 MED ORDER — ONDANSETRON HCL 4 MG/2ML IJ SOLN
4.0000 mg | Freq: Once | INTRAMUSCULAR | Status: AC
  Administered 2020-05-08: 4 mg via INTRAVENOUS
  Filled 2020-05-08: qty 2

## 2020-05-08 MED ORDER — ACETAMINOPHEN 325 MG PO TABS
650.0000 mg | ORAL_TABLET | Freq: Four times a day (QID) | ORAL | Status: DC | PRN
  Administered 2020-05-11 (×2): 650 mg via ORAL
  Filled 2020-05-08: qty 2

## 2020-05-08 MED ORDER — PREDNISONE 20 MG PO TABS
40.0000 mg | ORAL_TABLET | Freq: Every day | ORAL | Status: DC
  Administered 2020-05-09 – 2020-05-12 (×4): 40 mg via ORAL
  Filled 2020-05-08 (×4): qty 2

## 2020-05-08 MED ORDER — IPRATROPIUM-ALBUTEROL 0.5-2.5 (3) MG/3ML IN SOLN
3.0000 mL | Freq: Four times a day (QID) | RESPIRATORY_TRACT | Status: DC
  Administered 2020-05-09 (×2): 3 mL via RESPIRATORY_TRACT
  Filled 2020-05-08: qty 3

## 2020-05-08 MED ORDER — AMIODARONE HCL 200 MG PO TABS
100.0000 mg | ORAL_TABLET | Freq: Every day | ORAL | Status: DC
  Administered 2020-05-09 – 2020-05-12 (×4): 100 mg via ORAL
  Filled 2020-05-08 (×4): qty 1

## 2020-05-08 MED ORDER — PREDNISONE 20 MG PO TABS
40.0000 mg | ORAL_TABLET | Freq: Once | ORAL | Status: AC
  Administered 2020-05-08: 40 mg via ORAL
  Filled 2020-05-08: qty 2

## 2020-05-08 NOTE — H&P (Signed)
History and Physical   [name] IWP:[id number] DOB: 04/02/1946 DOA: 05/08/2020  PCP: William Pink, MD   Patient coming from: Home  Chief Complaint: Cough, shortness of breath  HPI: [name] is a 74 y.o. male with medical history significant of anxiety, CAD status post stent in 2006, hypertension, GERD, hyperlipidemia, IBS, CVA, sleep apnea, atrial fibrillation on anticoagulation,?COPD who presents with several days of productive cough, shortness of breath, wheezing.  Patient presented to [facility] on 11/29 with cough and congestion for several weeks prior to presentation and he was treated for questionable pneumonia versus viral illness with course of Augmentin.  Symptoms have not improved and he has had severe wheezing since that time.  He is also had dyspnea on exertion.  Is noted to become dyspnea on exertion in ED with some desaturation but remains above 90% in ED.  He reports some fever on Sunday and some nausea leading to decreased p.o. intake.  He denies chest pain, abdominal pain, constipation, diarrhea.  ED Course: Vital signs in the ED are stable.  Chest x-ray was without acute abnormality, CT head without acute abnormality.  Lab work showed normal BMP and CBC.  On warfarin PT 21 and INR 2.0.  Troponin flat at 8 and 6.  BNP pending, respiratory panel for flu and Covid negative.  Review of Systems: As per HPI otherwise all other systems reviewed and are negative.  Past Medical History:  Diagnosis Date   BPH (benign prostatic hyperplasia)    Coronary artery disease    a. s/p PCI to LCx in 2007; b. MV 2017 no sig ischemia, EF 59%, nl study   Depression    GERD (gastroesophageal reflux disease)    Hyperlipidemia    Hypertension    IBS (irritable bowel syndrome)    Myocardial infarction ([place])    12-13 yrs ago    PAF (paroxysmal atrial fibrillation) ([place])    a. CHADS2VASc => 5 (HTN, age x 1, stroke x 2, vascular disease); b. on Coumadin    PNA (pneumonia)     Skin cancer    face    Sleep apnea    off cpap    Stroke ([place])    a. x 2 with residual left-sided weakness    Past Surgical History:  Procedure Laterality Date   [place]  2006   X1 STENT   COLONOSCOPY     CORONARY ANGIOPLASTY     POLYPECTOMY     RIGHT/LEFT HEART CATH AND CORONARY ANGIOGRAPHY N/A 01/25/2020   Procedure: RIGHT/LEFT HEART CATH AND CORONARY ANGIOGRAPHY;  Surgeon: Minna Merritts, MD;  Location: [facility];  Service: Cardiovascular;  Laterality: N/A;   SKIN CANCER EXCISION N/A 08/2016   [place].  [facility] (Dr. Deliah Boston)   THYROIDECTOMY, PARTIAL     UPPER GASTROINTESTINAL ENDOSCOPY     VASCULAR SURGERY     varicose vein stripping left leg    Social History  reports that he has quit smoking. He has never used smokeless tobacco. He reports current alcohol use of about 2.0 - 3.0 standard drinks of alcohol per week. He reports that he does not use drugs.  Allergies  Allergen Reactions   Bactrim [Sulfamethoxazole-Trimethoprim] Anaphylaxis    Ulcers   Ciprofloxacin Swelling    Tongue   Crestor [Rosuvastatin Calcium]     Severe Myalgias    Family History  Problem Relation Age of Onset   Heart Problems Father    Heart disease  Father    Hypertension Father    Mental illness Mother    Arthritis Mother    Cancer Mother        ovarian   Stroke Paternal Uncle    Heart disease Paternal Grandfather    Cancer Maternal Aunt    Stroke Maternal Grandmother    Sudden death Maternal Grandfather    Heart disease Paternal Grandmother    Colon cancer Neg Hx    Esophageal cancer Neg Hx    Rectal cancer Neg Hx    Colon polyps Neg Hx    Stomach cancer Neg Hx   Reviewed on admission  Prior to Admission medications   Medication Sig Start Date End Date Taking? Authorizing Provider  acetaminophen (TYLENOL) 500 MG tablet Take 1,000 mg by mouth every 6 (six) hours as needed  for mild pain.   Yes [provider]  amiodarone (PACERONE) 100 MG tablet Take 1 tablet (100 mg total) by mouth daily. 01/29/20  Yes Gollan, Kathlene November, MD  amLODipine (NORVASC) 5 MG tablet TAKE 1 TABLET BY MOUTH EVERY DAY FOR BLOOD PRESSURE Patient taking differently: Take 5 mg by mouth daily.  06/12/19  Yes Pleas Koch, NP  amoxicillin-clavulanate (AUGMENTIN) 875-125 MG tablet Take 1 tablet by mouth 2 (two) times daily for 7 days. 05/06/20 05/13/20 Yes Naaman Plummer, MD  aspirin 81 MG tablet Take 81 mg by mouth at bedtime.    Yes [provider]  bisoprolol (ZEBETA) 10 MG tablet TAKE 1 AND 1/2 TABLETS BY MOUTH EVERY DAY FOR BLOOD PRESSURE. Patient taking differently: Take 15 mg by mouth daily.  10/04/19  Yes Pleas Koch, NP  Cholecalciferol (VITAMIN D3) 50 MCG (2000 UT) TABS Take 2,000 Units by mouth at bedtime.    Yes [provider]  DULoxetine (CYMBALTA) 20 MG capsule Take 40 mg by mouth daily.   Yes [provider]  ezetimibe (ZETIA) 10 MG tablet Take 1 tablet (10 mg total) by mouth daily. 04/24/20 07/23/20 Yes Loel Dubonnet, NP  furosemide (LASIX) 20 MG tablet Take 20 mg by mouth daily as needed for fluid or edema.    Yes [provider]  lisinopril (ZESTRIL) 40 MG tablet TAKE 1 TABLET (40 MG TOTAL) BY MOUTH DAILY. FOR BLOOD PRESSURE. 09/25/19  Yes Pleas Koch, NP  mirtazapine (REMERON) 15 MG tablet Take 15 mg by mouth at bedtime.   Yes [provider]  Multiple Vitamins-Minerals (MULTIVITAMIN WITH MINERALS) tablet Take 1 tablet by mouth daily. Centrum Silver   Yes [provider]  nitroGLYCERIN (NITROSTAT) 0.4 MG SL tablet Place 1 tablet (0.4 mg total) every 5 (five) minutes as needed under the tongue for chest pain. 04/16/17  Yes Leone Haven, MD  omeprazole (PRILOSEC) 40 MG capsule TAKE 1 CAPSULE (40 MG TOTAL) BY MOUTH 2 (TWO) TIMES DAILY BEFORE A MEAL. FOR HEARTBURN. Patient taking differently: Take 40  mg by mouth in the morning and at bedtime.  01/01/20  Yes Thornton Park, MD  potassium chloride (KLOR-CON) 10 MEQ tablet Take 10 mEq by mouth daily as needed (low Potassium). Only take with lasix   Yes [provider]  pravastatin (PRAVACHOL) 80 MG tablet Take 1 tablet (80 mg total) by mouth daily. For cholesterol. 03/22/20  Yes Minna Merritts, MD  warfarin (COUMADIN) 7.5 MG tablet Take 0.5-1 tablets (3.75-7.5 mg total) by mouth See admin instructions. Take 7.5mg  on sundays, all other days take 3.75mg  or as directed by coumadin clinic. Patient taking  differently: Take 3.75-7.5 mg by mouth See admin instructions. Take 3.75mg  on Monday,Wednesday and Fridays, 7.5 mg all the other days. 11/02/18  Yes Pleas Koch, NP  triamcinolone cream (KENALOG) 0.1 % Apply 1 application topically 2 (two) times daily. Patient not taking: Reported on 05/08/2020 01/19/19   Pleas Koch, NP    Physical Exam: Vitals:   05/08/20 2245 05/08/20 2300 05/08/20 2339 05/09/20 0055  BP: 131/78 (!) 137/92 (!) 169/67 124/73  Pulse: 72 72 74 72  Resp: 13 15 15 13   Temp:      TempSrc:      SpO2: 95% 96% 97% 97%  Weight:      Height:       Physical Exam Constitutional:      General: He is not in acute distress.    Appearance: Normal appearance.  HENT:     Head: Normocephalic and atraumatic.     Mouth/Throat:     Mouth: Mucous membranes are moist.     Pharynx: Oropharynx is clear.  Eyes:     Extraocular Movements: Extraocular movements intact.     Pupils: Pupils are equal, round, and reactive to light.  Cardiovascular:     Rate and Rhythm: Normal rate and regular rhythm.     Pulses: Normal pulses.     Heart sounds: Normal heart sounds.  Pulmonary:     Effort: Pulmonary effort is normal. No respiratory distress.     Breath sounds: Wheezing (diffuse) present.  Abdominal:     General: Bowel sounds are normal. There is no distension.     Palpations: Abdomen is soft.     Tenderness: There  is no abdominal tenderness.  Musculoskeletal:        General: No swelling or deformity.  Skin:    General: Skin is warm and dry.  Neurological:     General: No focal deficit present.     Mental Status: Mental status is at baseline.    Labs on Admission: I have personally reviewed following labs and imaging studies  CBC: Recent Labs  Lab 05/06/20 1020 05/08/20 1542  WBC 11.4* 6.2  NEUTROABS  --  4.2  HGB 14.1 13.1  HCT 41.6 40.2  MCV 93.5 95.7  PLT 194 161    Basic Metabolic Panel: Recent Labs  Lab 05/06/20 1020 05/08/20 1542  NA 138 138  K 4.1 3.7  CL 101 103  CO2 28 22  GLUCOSE 139* 124*  BUN 13 7*  CREATININE 1.07 0.97  CALCIUM 9.0 9.0    GFR: Estimated Creatinine Clearance: 73.3 mL/min (by C-G formula based on SCr of 0.97 mg/dL).  Liver Function Tests: No results for input(s): AST, ALT, ALKPHOS, BILITOT, PROT, ALBUMIN in the last 168 hours.  Urine analysis:    Component Value Date/Time   COLORURINE YELLOW 08/02/2018 1655   APPEARANCEUR CLEAR 08/02/2018 1655   LABSPEC 1.012 08/02/2018 1655   PHURINE 6.0 08/02/2018 1655   GLUCOSEU NEGATIVE 08/02/2018 1655   HGBUR NEGATIVE 08/02/2018 1655   BILIRUBINUR Negative 04/21/2019 [place] 08/02/2018 1655   PROTEINUR Positive (A) 04/21/2019 1545   PROTEINUR NEGATIVE 08/02/2018 1655   UROBILINOGEN 0.2 04/21/2019 1545   NITRITE Negative 04/21/2019 1545   NITRITE NEGATIVE 08/02/2018 1655   LEUKOCYTESUR Negative 04/21/2019 [place] 08/02/2018 1655    Radiological Exams on Admission: CT Head Wo Contrast  Result Date: 05/08/2020 CLINICAL DATA:  Head trauma, moderate/severe.  Fall. EXAM: CT HEAD WITHOUT CONTRAST TECHNIQUE: Contiguous axial images  were obtained from the base of the skull through the vertex without intravenous contrast. COMPARISON:  Brain MRI 08/02/2018.  Head CT 08/02/2018. FINDINGS: Brain: Mild cerebral atrophy. Mild ill-defined hypoattenuation within the  cerebral white matter is nonspecific, but compatible with chronic small vessel ischemic disease. There is no acute intracranial hemorrhage. No demarcated cortical infarct. No extra-axial fluid collection. No evidence of intracranial mass. No midline shift. Vascular: No hyperdense vessel.  Atherosclerotic calcifications. Skull: Normal. Negative for fracture or focal lesion. Sinuses/Orbits: Visualized orbits show no acute finding. Mild-to-moderate bilateral ethmoid sinus mucosal thickening. Small volume frothy secretions within the posterior left ethmoid air cell. Trace mucosal thickening within the visualized right maxillary sinus. Other: Subtle anterior scalp/forehead hematoma with possible laceration at this site. IMPRESSION: No evidence of acute intracranial abnormality. Anterior scalp/forehead hematoma with possible laceration at this site. Mild cerebral atrophy and chronic small vessel ischemic disease. Ethmoid sinusitis. Electronically Signed   By: Kellie Simmering DO   On: 05/08/2020 17:26   DG Chest Portable 1 View  Result Date: 05/08/2020 CLINICAL DATA:  Fall and shortness of breath. EXAM: PORTABLE CHEST 1 VIEW COMPARISON:  05/06/2020 FINDINGS: Cardiac silhouette is normal in size. No mediastinal or hilar masses. Clear lungs.  No pleural effusion or pneumothorax. Skeletal structures are grossly intact. IMPRESSION: No active disease. Electronically Signed   By: Lajean Manes M.D.   On: 05/08/2020 16:16    EKG: Independently reviewed.  Sinus rhythm, nonspecific T wave abnormality anterior and lateral leads.  Assessment/Plan Principal Problem:   COPD exacerbation (HCC) Active Problems:   AF (paroxysmal atrial fibrillation) (HCC)   CAD (coronary artery disease)   Hyperlipidemia   History of CVA (cerebrovascular accident)   Anxiety and depression   GERD (gastroesophageal reflux disease)   IBS (irritable bowel syndrome)   Essential hypertension   Sleep apnea   Chronic obstructive pulmonary  disease (HCC)  COPD exacerbation > Per chart review, patient noted to have borderline COPD in the past.  Had spirometry done in 2020 with moderate obstruction without significant improvement postbronchodilator.  No formal PFTs.  Records indicate a trial of Symbicort but patient denies that he ever received this medication. > Presenting with several days of cough shortness of breath some increased whitish sputum not improving after receiving Augmentin at [facility] for possible pneumonia versus viral illness > Negative for flu and Covid ED -Received some breathing treatments and steroids in ED - Continue with daily prednisone - DuoNebs every 6 hours while awake - As needed albuterol - Will start a course of azithromycin given sputum production - Will likely benefit from starting long-acting inhalers this admission - Add on full RVP  Hypertension Hyperlipidemia CAD status post stent in 2006 Atrial fibrillation on anticoagulation > No chest pain (other than with coughing), sinus rhythm in ED - Continue home amiodarone for rhythm control - Continue home bisoprolol, amlodipine, lisinopril - Continue home pravastatin and Zetia - Warfarin per pharmacy  Hx of CVA - Continue home pravastatin, Zetia as above - Continue home ASA  GERD, IBS - Continue home Protonix  Anxiety and Depression - Continue home duloxetine and mirtazapine  Sleep apnea  - Has been prescribed CPAP in the past but does not use this per his report  DVT prophylaxis: Warfarin   Code Status:   Full Family Communication:  None on admission  Disposition Plan:   Patient is from:  Home  Anticipated DC to:  Home  Anticipated DC date:  Pending clinical course  Anticipated DC barriers:  None  Consults called:  None Admission status:  Observation, telemetry   Severity of Illness: The appropriate patient status for this patient is OBSERVATION. Observation status is judged to be reasonable and necessary in order to provide the  required intensity of service to ensure the patient's safety. The patient's presenting symptoms, physical exam findings, and initial radiographic and laboratory data in the context of their medical condition is felt to place them at decreased risk for further clinical deterioration. Furthermore, it is anticipated that the patient will be medically stable for discharge from the hospital within 2 midnights of admission. The following factors support the patient status of observation.   " The patient's presenting symptoms include cough, shortness of breath, dyspnea on exertion. " The physical exam findings include diffuse wheezing. " The initial radiographic and laboratory data are stable at this time.Marcelyn Bruins MD [facility]  How to contact the [facility] Attending or Consulting provider [place] or covering provider during after hours [place], for this patient?   1. Check the care team in [facility] and look for a) attending/consulting [facility] provider listed and b) the [facility] team listed 2. Log into [email or web address] and use [place]'s universal password to access. If you do not have the password, please contact the hospital operator. 3. Locate the [facility] provider you are looking for under [facility] and page to a number that you can be directly reached. 4. If you still have difficulty reaching the provider, please page the [facility] (Director on Call) for the Hospitalists listed on amion for assistance.  05/09/2020, 1:06 AM

## 2020-05-08 NOTE — ED Notes (Signed)
Pt walking ox at 91%. Pt endorses SHOB. Pt was one assisted throughout the process.

## 2020-05-08 NOTE — ED Notes (Signed)
Pt wife endorses that pt was hallucinating today: pt states he saw spiders fighting on the curtain. Pt states episode lasted for approximately 5 minutes and states a possible cause could be his lack of sleep.

## 2020-05-08 NOTE — Progress Notes (Signed)
[place] for Warfarin Indication: atrial fibrillation  Allergies  Allergen Reactions  . Bactrim [Sulfamethoxazole-Trimethoprim] Anaphylaxis    Ulcers  . Ciprofloxacin Swelling    Tongue  . Crestor [Rosuvastatin Calcium]     Severe Myalgias    Patient Measurements: Height: 6' (182.9 cm) Weight: 83.9 kg (185 lb) IBW/kg (Calculated) : 77.6 Heparin Dosing Weight:   Vital Signs: Temp: 99.2 F (37.3 C) (12/01 1526) Temp Source: Oral (12/01 1526) BP: 141/87 (12/01 2146) Pulse Rate: 79 (12/01 2146)  Labs: Recent Labs    05/06/20 1020 05/08/20 1542 05/08/20 1743  HGB 14.1 13.1  --   HCT 41.6 40.2  --   PLT 194 162  --   LABPROT  --  21.7*  --   INR  --  2.0*  --   CREATININE 1.07 0.97  --   TROPONINIHS 10 8 6     Estimated Creatinine Clearance: 73.3 mL/min (by C-G formula based on SCr of 0.97 mg/dL).   Medical History: Past Medical History:  Diagnosis Date  . BPH (benign prostatic hyperplasia)   . Coronary artery disease    a. s/p PCI to LCx in 2007; b. MV 2017 no sig ischemia, EF 59%, nl study  . Depression   . GERD (gastroesophageal reflux disease)   . Hyperlipidemia   . Hypertension   . IBS (irritable bowel syndrome)   . Myocardial infarction ([place])    12-13 yrs ago   . PAF (paroxysmal atrial fibrillation) ([place])    a. CHADS2VASc => 5 (HTN, age x 1, stroke x 2, vascular disease); b. on Coumadin   . PNA (pneumonia)   . Skin cancer    face   . Sleep apnea    off cpap   . Stroke [facility])    a. x 2 with residual left-sided weakness    Medications:  Scheduled:  . [START ON 05/09/2020] amiodarone  100 mg Oral Daily  . [START ON 05/09/2020] amLODipine  5 mg Oral Daily  . aspirin EC  81 mg Oral QHS  . [START ON 05/09/2020] bisoprolol  20 mg Oral Daily  . [START ON 05/09/2020] DULoxetine  40 mg Oral Daily  . [START ON 05/09/2020] ezetimibe  10 mg Oral Daily  . ipratropium-albuterol  3 mL Nebulization Q6H WA  . [START ON  05/09/2020] lisinopril  40 mg Oral Daily  . mirtazapine  15 mg Oral QHS  . [START ON 05/09/2020] pantoprazole  40 mg Oral Daily  . [START ON 05/09/2020] pravastatin  80 mg Oral Daily  . [START ON 05/09/2020] predniSONE  40 mg Oral Q breakfast  . sodium chloride flush  3 mL Intravenous Q12H  . warfarin  3.75 mg Oral Once  . [START ON 05/09/2020] Warfarin - Pharmacist Dosing Inpatient   Does not apply q1600    Assessment: Patient is a 39 yom that is being admitted for SOB. Patient is on Warfarin PTA for afib. Pharmacy has been asked to dose Warfarin while inpatient.   PTA Regimen: 3.75mg  MWF and 7.5mg  ROW   Goal of Therapy:  INR 2-3 Monitor platelets by anticoagulation protocol: Yes   Plan:  - Patient is currently therapeutic at 2.0  - Continue home regimen with Warfarin 3.75mg  PO x1 dose tonight - PT-INR daily and monitor patient for s/s of bleeding   Duanne Limerick PharmD. BCPS  05/08/2020,9:51 PM

## 2020-05-08 NOTE — ED Provider Notes (Signed)
[facility] EMERGENCY DEPARTMENT Provider Note   CSN: [id number] Arrival date & time: 05/08/20  1509     History Chief Complaint  Patient presents with  . Fall  . Cough    [name] is a 74 y.o. male.  Presents to ER with concern for worsening cough, fall.  Over the last few days, patient has been having progressively worsening cough, feels short of breath when he has bad coughing spells.  Nothing he has done has helped with the cough.  Went to [facility] and diagnosed with possible pneumonia and started on Augmentin.  Reports no improvement since starting this.  Felt very weak yesterday morning, had a fall, denies loss of consciousness, did hit his head, did not seek medical care at the time.  Noted small laceration.  Has had low-grade temperature but no frank fever.  History of CAD, paroxysmal A. fib on Coumadin, prior stroke  HPI     Past Medical History:  Diagnosis Date  . BPH (benign prostatic hyperplasia)   . Coronary artery disease    a. s/p PCI to LCx in 2007; b. MV 2017 no sig ischemia, EF 59%, nl study  . Depression   . GERD (gastroesophageal reflux disease)   . Hyperlipidemia   . Hypertension   . IBS (irritable bowel syndrome)   . Myocardial infarction ([place])    12-13 yrs ago   . PAF (paroxysmal atrial fibrillation) ([place])    a. CHADS2VASc => 5 (HTN, age x 1, stroke x 2, vascular disease); b. on Coumadin   . PNA (pneumonia)   . Skin cancer    face   . Sleep apnea    off cpap   . Stroke [facility])    a. x 2 with residual left-sided weakness    Patient Active Problem List   Diagnosis Date Noted  . COPD exacerbation ([place]) 05/08/2020  . Unstable angina ([place]) 01/25/2020  . Statin myopathy 06/07/2019  . Dysuria 04/21/2019  . Right elbow pain 12/21/2018  . Left-sided weakness 08/12/2018  . Chronic obstructive pulmonary disease ([place]) 06/13/2018  . Abdominal pain 05/27/2018  . Wrist pain, acute, right 11/05/2017  . Sleep apnea 09/16/2017  .  Encounter for current long-term use of anticoagulants 07/24/2017  . Fall 07/21/2017  . Constipation 07/21/2017  . Chronic fatigue 06/18/2017  . Tremor 06/18/2017  . Eczema 06/18/2017  . Testicular atrophy 04/23/2017  . Dyspnea on exertion 03/16/2017  . Erectile dysfunction 03/05/2017  . Preventative health care 02/11/2016  . Palpitations 12/09/2015  . Anxiety and depression 02/08/2015  . GERD (gastroesophageal reflux disease) 02/08/2015  . IBS (irritable bowel syndrome) 02/08/2015  . Essential hypertension 02/08/2015  . CAD (coronary artery disease) 01/23/2015  . Hyperlipidemia 01/23/2015    Past Surgical History:  Procedure Laterality Date  . ADENOIDECTOMY  1951  . CARDIAC CATHETERIZATION  2006   X1 STENT  . COLONOSCOPY    . CORONARY ANGIOPLASTY    . POLYPECTOMY    . RIGHT/LEFT HEART CATH AND CORONARY ANGIOGRAPHY N/A 01/25/2020   Procedure: RIGHT/LEFT HEART CATH AND CORONARY ANGIOGRAPHY;  Surgeon: Minna Merritts, MD;  Location: [facility] CV [facility];  Service: Cardiovascular;  Laterality: N/A;  . SKIN CANCER EXCISION N/A 08/2016   [place].  [facility] (Dr. Deliah Boston)  . THYROIDECTOMY, PARTIAL    . UPPER GASTROINTESTINAL ENDOSCOPY    . VASCULAR SURGERY     varicose vein stripping left leg       Family History  Problem Relation  Age of Onset  . Heart Problems Father   . Heart disease Father   . Hypertension Father   . Mental illness Mother   . Arthritis Mother   . Cancer Mother        ovarian  . Stroke Paternal Uncle   . Heart disease Paternal Grandfather   . Cancer Maternal Aunt   . Stroke Maternal Grandmother   . Sudden death Maternal Grandfather   . Heart disease Paternal Grandmother   . Colon cancer Neg Hx   . Esophageal cancer Neg Hx   . Rectal cancer Neg Hx   . Colon polyps Neg Hx   . Stomach cancer Neg Hx     Social History   Tobacco Use  . Smoking status: Former Research scientist (life sciences)  . Smokeless tobacco: Never Used  Vaping Use  . Vaping  Use: Never used  Substance Use Topics  . Alcohol use: Yes    Alcohol/week: 2.0 - 3.0 standard drinks    Types: 2 - 3 Glasses of wine per week    Comment: Wine daily with dinner most days  . Drug use: No    Home Medications Prior to Admission medications   Medication Sig Start Date End Date Taking? Authorizing Provider  acetaminophen (TYLENOL) 500 MG tablet Take 1,000 mg by mouth every 6 (six) hours as needed for mild pain.   Yes [provider]  amiodarone (PACERONE) 100 MG tablet Take 1 tablet (100 mg total) by mouth daily. 01/29/20  Yes Gollan, Kathlene November, MD  amLODipine (NORVASC) 5 MG tablet TAKE 1 TABLET BY MOUTH EVERY DAY FOR BLOOD PRESSURE Patient taking differently: Take 5 mg by mouth daily.  06/12/19  Yes Pleas Koch, NP  amoxicillin-clavulanate (AUGMENTIN) 875-125 MG tablet Take 1 tablet by mouth 2 (two) times daily for 7 days. 05/06/20 05/13/20 Yes Naaman Plummer, MD  aspirin 81 MG tablet Take 81 mg by mouth at bedtime.    Yes [provider]  bisoprolol (ZEBETA) 10 MG tablet TAKE 1 AND 1/2 TABLETS BY MOUTH EVERY DAY FOR BLOOD PRESSURE. Patient taking differently: Take 15 mg by mouth daily.  10/04/19  Yes Pleas Koch, NP  Cholecalciferol (VITAMIN D3) 50 MCG (2000 UT) TABS Take 2,000 Units by mouth at bedtime.    Yes [provider]  DULoxetine (CYMBALTA) 20 MG capsule Take 40 mg by mouth daily.   Yes [provider]  ezetimibe (ZETIA) 10 MG tablet Take 1 tablet (10 mg total) by mouth daily. 04/24/20 07/23/20 Yes Loel Dubonnet, NP  furosemide (LASIX) 20 MG tablet Take 20 mg by mouth daily as needed for fluid or edema.    Yes [provider]  lisinopril (ZESTRIL) 40 MG tablet TAKE 1 TABLET (40 MG TOTAL) BY MOUTH DAILY. FOR BLOOD PRESSURE. 09/25/19  Yes Pleas Koch, NP  mirtazapine (REMERON) 15 MG tablet Take 15 mg by mouth at bedtime.   Yes [provider]  Multiple Vitamins-Minerals (MULTIVITAMIN WITH MINERALS)  tablet Take 1 tablet by mouth daily. Centrum Silver   Yes [provider]  nitroGLYCERIN (NITROSTAT) 0.4 MG SL tablet Place 1 tablet (0.4 mg total) every 5 (five) minutes as needed under the tongue for chest pain. 04/16/17  Yes Leone Haven, MD  omeprazole (PRILOSEC) 40 MG capsule TAKE 1 CAPSULE (40 MG TOTAL) BY MOUTH 2 (TWO) TIMES DAILY BEFORE A MEAL. FOR HEARTBURN. Patient taking differently: Take 40 mg by mouth in the morning and at bedtime.  01/01/20  Yes  Thornton Park, MD  potassium chloride (KLOR-CON) 10 MEQ tablet Take 10 mEq by mouth daily as needed (low Potassium). Only take with lasix   Yes [provider]  pravastatin (PRAVACHOL) 80 MG tablet Take 1 tablet (80 mg total) by mouth daily. For cholesterol. 03/22/20  Yes Minna Merritts, MD  warfarin (COUMADIN) 7.5 MG tablet Take 0.5-1 tablets (3.75-7.5 mg total) by mouth See admin instructions. Take 7.5mg  on sundays, all other days take 3.75mg  or as directed by coumadin clinic. Patient taking differently: Take 3.75-7.5 mg by mouth See admin instructions. Take 3.75mg  on Monday,Wednesday and Fridays, 7.5 mg all the other days. 11/02/18  Yes Pleas Koch, NP  triamcinolone cream (KENALOG) 0.1 % Apply 1 application topically 2 (two) times daily. Patient not taking: Reported on 05/08/2020 01/19/19   Pleas Koch, NP    Allergies    Bactrim [sulfamethoxazole-trimethoprim], Ciprofloxacin, and Crestor [rosuvastatin calcium]  Review of Systems   Review of Systems  Constitutional: Positive for chills and fatigue. Negative for fever.  HENT: Negative for ear pain and sore throat.   Eyes: Negative for pain and visual disturbance.  Respiratory: Positive for cough and shortness of breath.   Cardiovascular: Negative for chest pain and palpitations.  Gastrointestinal: Negative for abdominal pain and vomiting.  Genitourinary: Negative for dysuria and hematuria.  Musculoskeletal: Negative for arthralgias and back  pain.  Skin: Negative for color change and rash.  Neurological: Positive for headaches. Negative for seizures and syncope.  All other systems reviewed and are negative.   Physical Exam Updated Vital Signs BP (!) 169/67   Pulse 74   Temp 99.2 F (37.3 C) (Oral)   Resp 15   Ht 6' (1.829 m)   Wt 83.9 kg   SpO2 97%   BMI 25.09 kg/m   Physical Exam Vitals and nursing note reviewed.  Constitutional:      Appearance: He is well-developed.  HENT:     Head: Normocephalic.     Comments: Healing 2 cm laceration over forehead, small hematoma noted, no active bleeding Eyes:     Conjunctiva/sclera: Conjunctivae normal.  Cardiovascular:     Rate and Rhythm: Normal rate and regular rhythm.     Heart sounds: No murmur heard.   Pulmonary:     Effort: Pulmonary effort is normal. No respiratory distress.     Breath sounds: Normal breath sounds.  Abdominal:     Palpations: Abdomen is soft.     Tenderness: There is no abdominal tenderness.  Musculoskeletal:     Cervical back: Neck supple.     Comments: Back: no C, T, L spine TTP, no step off or deformity RUE: no TTP throughout, no deformity, normal joint ROM, radial pulse intact, distal sensation and motor intact LUE: no TTP throughout, no deformity, normal joint ROM, radial pulse intact, distal sensation and motor intact RLE:  no TTP throughout, no deformity, normal joint ROM, distal pulse, sensation and motor intact LLE: no TTP throughout, no deformity, normal joint ROM, distal pulse, sensation and motor intact  Skin:    General: Skin is warm and dry.  Neurological:     General: No focal deficit present.     Mental Status: He is alert and oriented to person, place, and time.  Psychiatric:        Mood and Affect: Mood normal.     ED Results / Procedures / Treatments   Labs (all labs ordered are listed, but only abnormal results are displayed) Labs Reviewed  CBC  WITH DIFFERENTIAL/PLATELET - Abnormal; Notable for the following  components:      Result Value   RBC 4.20 (*)    All other components within normal limits  BASIC METABOLIC PANEL - Abnormal; Notable for the following components:   Glucose, Bld 124 (*)    BUN 7 (*)    All other components within normal limits  PROTIME-INR - Abnormal; Notable for the following components:   Prothrombin Time 21.7 (*)    INR 2.0 (*)    All other components within normal limits  BRAIN NATRIURETIC PEPTIDE - Abnormal; Notable for the following components:   B Natriuretic Peptide 178.9 (*)    All other components within normal limits  RESP PANEL BY RT-PCR (FLU A&B, COVID) ARPGX2  RESPIRATORY PANEL BY PCR  CBC  PROTIME-INR  TROPONIN I (HIGH SENSITIVITY)  TROPONIN I (HIGH SENSITIVITY)    EKG EKG Interpretation  Date/Time:  Wednesday May 08 2020 15:23:13 EST Ventricular Rate:  71 PR Interval:    QRS Duration: 95 QT Interval:  394 QTC Calculation: 429 R Axis:   61 Text Interpretation: Sinus rhythm Short PR interval Borderline repolarization abnormality Confirmed by Madalyn Rob [phone]) on 05/08/2020 4:44:27 PM   Radiology CT Head Wo Contrast  Result Date: 05/08/2020 CLINICAL DATA:  Head trauma, moderate/severe.  Fall. EXAM: CT HEAD WITHOUT CONTRAST TECHNIQUE: Contiguous axial images were obtained from the base of the skull through the vertex without intravenous contrast. COMPARISON:  Brain MRI 08/02/2018.  Head CT 08/02/2018. FINDINGS: Brain: Mild cerebral atrophy. Mild ill-defined hypoattenuation within the cerebral white matter is nonspecific, but compatible with chronic small vessel ischemic disease. There is no acute intracranial hemorrhage. No demarcated cortical infarct. No extra-axial fluid collection. No evidence of intracranial mass. No midline shift. Vascular: No hyperdense vessel.  Atherosclerotic calcifications. Skull: Normal. Negative for fracture or focal lesion. Sinuses/Orbits: Visualized orbits show no acute finding. Mild-to-moderate bilateral  ethmoid sinus mucosal thickening. Small volume frothy secretions within the posterior left ethmoid air cell. Trace mucosal thickening within the visualized right maxillary sinus. Other: Subtle anterior scalp/forehead hematoma with possible laceration at this site. IMPRESSION: No evidence of acute intracranial abnormality. Anterior scalp/forehead hematoma with possible laceration at this site. Mild cerebral atrophy and chronic small vessel ischemic disease. Ethmoid sinusitis. Electronically Signed   By: Kellie Simmering DO   On: 05/08/2020 17:26   DG Chest Portable 1 View  Result Date: 05/08/2020 CLINICAL DATA:  Fall and shortness of breath. EXAM: PORTABLE CHEST 1 VIEW COMPARISON:  05/06/2020 FINDINGS: Cardiac silhouette is normal in size. No mediastinal or hilar masses. Clear lungs.  No pleural effusion or pneumothorax. Skeletal structures are grossly intact. IMPRESSION: No active disease. Electronically Signed   By: Lajean Manes M.D.   On: 05/08/2020 16:16    Procedures Procedures (including critical care time)  Medications Ordered in ED Medications  aspirin EC tablet 81 mg (81 mg Oral Given 05/08/20 2255)  amiodarone (PACERONE) tablet 100 mg (has no administration in time range)  amLODipine (NORVASC) tablet 5 mg (has no administration in time range)  bisoprolol (ZEBETA) tablet 20 mg (has no administration in time range)  ezetimibe (ZETIA) tablet 10 mg (has no administration in time range)  lisinopril (ZESTRIL) tablet 40 mg (has no administration in time range)  pravastatin (PRAVACHOL) tablet 80 mg (has no administration in time range)  DULoxetine (CYMBALTA) DR capsule 40 mg (has no administration in time range)  mirtazapine (REMERON) tablet 15 mg (15 mg Oral Given 05/08/20 2255)  pantoprazole (PROTONIX)  EC tablet 40 mg (has no administration in time range)  predniSONE (DELTASONE) tablet 40 mg (has no administration in time range)  ipratropium-albuterol (DUONEB) 0.5-2.5 (3) MG/3ML nebulizer  solution 3 mL (3 mLs Nebulization Not Given 05/08/20 2138)  albuterol (PROVENTIL) (2.5 MG/3ML) 0.083% nebulizer solution 2.5 mg (has no administration in time range)  sodium chloride flush (NS) 0.9 % injection 3 mL (3 mLs Intravenous Given 05/08/20 2258)  acetaminophen (TYLENOL) tablet 650 mg (has no administration in time range)    Or  acetaminophen (TYLENOL) suppository 650 mg (has no administration in time range)  Warfarin - Pharmacist Dosing Inpatient (has no administration in time range)  albuterol (VENTOLIN HFA) 108 (90 Base) MCG/ACT inhaler 4 puff (4 puffs Inhalation Given 05/08/20 1612)  ipratropium-albuterol (DUONEB) 0.5-2.5 (3) MG/3ML nebulizer solution 3 mL (3 mLs Nebulization Given 05/08/20 1741)  ondansetron (ZOFRAN) injection 4 mg (4 mg Intravenous Given 05/08/20 1906)  predniSONE (DELTASONE) tablet 40 mg (40 mg Oral Given 05/08/20 1906)  ipratropium-albuterol (DUONEB) 0.5-2.5 (3) MG/3ML nebulizer solution 3 mL (3 mLs Nebulization Given 05/08/20 2004)  warfarin (COUMADIN) tablet 3.75 mg (3.75 mg Oral Given 05/08/20 2254)    ED Course  I have reviewed the triage vital signs and the nursing notes.  Pertinent labs & imaging results that were available during my care of the patient were reviewed by me and considered in my medical decision making (see chart for details).    MDM Rules/Calculators/A&P                         74 year old male with cough congestion shortness of breath.  On exam patient in no distress, noted some expiratory wheeze.  CXR negative for pneumonia.  Basic labs within normal limits.  No EKG changes, troponin within normal limits, doubt ACS.  Suspect reactive airway process, underlying viral illness.  Provided albuterol, steroids, despite multiple doses of albuterol, still feeling short of breath, did poorly on ambulation trial.  Believe patient would benefit from admission for further observation and management.  Consult to hospitalist, Dr. Trilby Drummer accepting.  Final  Clinical Impression(s) / ED Diagnoses Final diagnoses:  Bronchitis  Reactive airway disease with acute exacerbation, unspecified asthma severity, unspecified whether persistent  Acute respiratory failure, unspecified whether with hypoxia or hypercapnia [facility])    Rx / DC Orders ED Discharge Orders    None       Lucrezia Starch, MD 05/09/20 0004

## 2020-05-08 NOTE — ED Triage Notes (Addendum)
BIB [facility] w/ multiple complaints. Pt mainly calling EMS because he was seen at [facility] and dx w/ PNA and given abx on d/c. He has had a persistent cough that has made it hard for the pt to sleep in the last 4 days. Patient also suffered from a fall out of bed that occurred 0430 on 11/30. Pt is on a blood thinner- Warfarin. Pt stated "the fall happened so fast I don't even remember what happened". Pt does have a hematoma to the forehead area that was bandaged by EMS. EMS reported no LOC, but denies LOC stating "I was a little fuzzy headed, but I dont think I did"/ VS w/ EMS  BP 146/104 P-70 Resp-20 temp- 99.9 CBG-156  Pt was given 250 of NS enroute.

## 2020-05-09 ENCOUNTER — Encounter (HOSPITAL_COMMUNITY): Payer: Self-pay | Admitting: Internal Medicine

## 2020-05-09 DIAGNOSIS — J441 Chronic obstructive pulmonary disease with (acute) exacerbation: Principal | ICD-10-CM

## 2020-05-09 LAB — CBC
HCT: 39.9 % (ref 39.0–52.0)
Hemoglobin: 13.4 g/dL (ref 13.0–17.0)
MCH: 31.2 pg (ref 26.0–34.0)
MCHC: 33.6 g/dL (ref 30.0–36.0)
MCV: 93 fL (ref 80.0–100.0)
Platelets: 228 10*3/uL (ref 150–400)
RBC: 4.29 MIL/uL (ref 4.22–5.81)
RDW: 13.9 % (ref 11.5–15.5)
WBC: 6.1 10*3/uL (ref 4.0–10.5)
nRBC: 0 % (ref 0.0–0.2)

## 2020-05-09 LAB — PROTIME-INR
INR: 1.8 — ABNORMAL HIGH (ref 0.8–1.2)
Prothrombin Time: 20 seconds — ABNORMAL HIGH (ref 11.4–15.2)

## 2020-05-09 LAB — RESPIRATORY PANEL BY PCR

## 2020-05-09 MED ORDER — AZITHROMYCIN 250 MG PO TABS
250.0000 mg | ORAL_TABLET | Freq: Every day | ORAL | Status: DC
  Administered 2020-05-10 – 2020-05-12 (×3): 250 mg via ORAL
  Filled 2020-05-09 (×4): qty 1

## 2020-05-09 MED ORDER — WARFARIN SODIUM 7.5 MG PO TABS
7.5000 mg | ORAL_TABLET | Freq: Once | ORAL | Status: AC
  Administered 2020-05-09: 7.5 mg via ORAL
  Filled 2020-05-09 (×2): qty 1

## 2020-05-09 MED ORDER — HYDROCOD POLST-CPM POLST ER 10-8 MG/5ML PO SUER
5.0000 mL | Freq: Once | ORAL | Status: AC
  Administered 2020-05-09: 5 mL via ORAL
  Filled 2020-05-09: qty 5

## 2020-05-09 MED ORDER — AZITHROMYCIN 250 MG PO TABS
500.0000 mg | ORAL_TABLET | Freq: Every day | ORAL | Status: AC
  Administered 2020-05-09: 500 mg via ORAL

## 2020-05-09 NOTE — Progress Notes (Signed)
PROGRESS NOTE    [name]  UDJ:[id number] DOB: Sep 11, 1945 DOA: 05/08/2020 PCP: [name], MD     Brief Narrative:  [name] is a 74 y.o. male with medical history significant of anxiety, CAD status post stent in 2006, hypertension, GERD, hyperlipidemia, IBS, CVA, sleep apnea, atrial fibrillation on anticoagulation, ?COPD who presents with several days of productive cough, shortness of breath, wheezing.  Patient presented to [facility] on 11/29 with cough and congestion for several weeks prior to presentation and he was treated for questionable pneumonia versus viral illness with course of Augmentin.  Symptoms have not improved and he has had severe wheezing since that time.  He is also had dyspnea on exertion. Vital signs in the ED are stable.  Chest x-ray was without acute abnormality, CT head without acute abnormality.  Patient was admitted for suspected COPD exacerbation with respiratory failure.  New events last 24 hours / Subjective: Feeling much better this morning, states that his breathing has improved.  Assessment & Plan:   Principal Problem:   COPD exacerbation ([place]) Active Problems:   AF (paroxysmal atrial fibrillation) (HCC)   CAD (coronary artery disease)   Hyperlipidemia   History of CVA (cerebrovascular accident)   Anxiety and depression   GERD (gastroesophageal reflux disease)   IBS (irritable bowel syndrome)   Essential hypertension   Sleep apnea   Chronic obstructive pulmonary disease (HCC)   Acute COPD exacerbation -Recommend formal PFT as outpatient -Continue prednisone, breathing treatment, azithromycin -RVP pending  Fall at home without syncope -CT head without evidence of acute intracranial abnormality.  Anterior scalp/forehead hematoma with possible laceration present  CAD -Without chest pain.  Negative troponin  Paroxysmal A. fib  -Continue amiodarone, bisoprolol, warfarin  Hypertension -Continue amlodipine,  lisinopril  Hyperlipidemia -Continue pravastatin, Zetia  History of CVA -Continue aspirin, warfarin, pravastatin, Zetia  GERD -Continue Protonix  Anxiety/depression -Continue duloxetine, mirtazapine  OSA -Noncompliant with CPAP at home   DVT prophylaxis:   warfarin (COUMADIN) tablet 7.5 mg  Code Status: Full code Family Communication: No family at bedside Disposition Plan:  Status is: Observation  The patient will require care spanning > 2 midnights and should be moved to inpatient because: Hemodynamically unstable, IV treatments appropriate due to intensity of illness or inability to take PO and Inpatient level of care appropriate due to severity of illness  Dispo: The patient is from: Home              Anticipated d/c is to: Home              Anticipated d/c date is: 2 days              Patient currently is not medically stable to d/c.  Continue treatment for COPD exacerbation.  He is not at baseline, continues to have bilateral wheezes and requiring oxygen currently.   Consultants:   None  Procedures:   None  Antimicrobials:  Anti-infectives (From admission, onward)   Start     Dose/Rate Route Frequency Ordered Stop   05/10/20 1000  azithromycin (ZITHROMAX) tablet 250 mg       "Followed by" Linked Group Details   250 mg Oral Daily 05/09/20 0057 05/14/20 0959   05/09/20 0115  azithromycin (ZITHROMAX) tablet 500 mg       "Followed by" Linked Group Details   500 mg Oral Daily 05/09/20 0057 05/09/20 1020        Objective: Vitals:   05/09/20 0930 05/09/20 1000 05/09/20 1030 05/09/20 1130  BP:  132/78 140/81 130/87  Pulse:  74 76 70  Resp:  14 15 17   Temp: 98.5 F (36.9 C)     TempSrc: Oral     SpO2:  96% 98% 99%  Weight:      Height:       No intake or output data in the 24 hours ending 05/09/20 1251 Filed Weights   05/08/20 1526  Weight: 83.9 kg    Examination:  General exam: Appears calm and comfortable  Respiratory system: Bilateral  expiratory wheezes, on 4 L oxygen Cardiovascular system: S1 & S2 heard, RRR. No murmurs. No pedal edema. Gastrointestinal system: Abdomen is nondistended, soft and nontender. Normal bowel sounds heard. Central nervous system: Alert and oriented. No focal neurological deficits. Speech clear.  Extremities: Symmetric in appearance  Skin: No rashes, lesions or ulcers on exposed skin  Psychiatry: Judgement and insight appear normal. Mood & affect appropriate.   Data Reviewed: I have personally reviewed following labs and imaging studies  CBC: Recent Labs  Lab 05/06/20 1020 05/08/20 1542 05/09/20 0429  WBC 11.4* 6.2 6.1  NEUTROABS  --  4.2  --   HGB 14.1 13.1 13.4  HCT 41.6 40.2 39.9  MCV 93.5 95.7 93.0  PLT 194 162 371   Basic Metabolic Panel: Recent Labs  Lab 05/06/20 1020 05/08/20 1542  NA 138 138  K 4.1 3.7  CL 101 103  CO2 28 22  GLUCOSE 139* 124*  BUN 13 7*  CREATININE 1.07 0.97  CALCIUM 9.0 9.0   GFR: Estimated Creatinine Clearance: 73.3 mL/min (by C-G formula based on SCr of 0.97 mg/dL). Liver Function Tests: No results for input(s): AST, ALT, ALKPHOS, BILITOT, PROT, ALBUMIN in the last 168 hours. No results for input(s): LIPASE, AMYLASE in the last 168 hours. No results for input(s): AMMONIA in the last 168 hours. Coagulation Profile: Recent Labs  Lab 05/08/20 1542 05/09/20 0429  INR 2.0* 1.8*   Cardiac Enzymes: No results for input(s): CKTOTAL, CKMB, CKMBINDEX, TROPONINI in the last 168 hours. BNP (last 3 results) No results for input(s): PROBNP in the last 8760 hours. HbA1C: No results for input(s): HGBA1C in the last 72 hours. CBG: No results for input(s): GLUCAP in the last 168 hours. Lipid Profile: No results for input(s): CHOL, HDL, LDLCALC, TRIG, CHOLHDL, LDLDIRECT in the last 72 hours. Thyroid Function Tests: No results for input(s): TSH, T4TOTAL, FREET4, T3FREE, THYROIDAB in the last 72 hours. Anemia Panel: No results for input(s):  VITAMINB12, FOLATE, FERRITIN, TIBC, IRON, RETICCTPCT in the last 72 hours. Sepsis Labs: No results for input(s): PROCALCITON, LATICACIDVEN in the last 168 hours.  Recent Results (from the past 240 hour(s))  Resp Panel by RT-PCR (Flu A&B, Covid) Nasopharyngeal Swab     Status: None   Collection Time: 05/06/20 12:03 PM   Specimen: Nasopharyngeal Swab; Nasopharyngeal(NP) swabs in vial transport medium  Result Value Ref Range Status   SARS Coronavirus 2 by RT PCR NEGATIVE NEGATIVE Final    Comment: (NOTE) SARS-CoV-2 target nucleic acids are NOT DETECTED.  The SARS-CoV-2 RNA is generally detectable in upper respiratory specimens during the acute phase of infection. The lowest concentration of SARS-CoV-2 viral copies this assay can detect is 138 copies/mL. A negative result does not preclude SARS-Cov-2 infection and should not be used as the sole basis for treatment or other patient management decisions. A negative result may occur with  improper specimen collection/handling, submission of specimen other than nasopharyngeal swab, presence of viral mutation(s) within the areas  targeted by this assay, and inadequate number of viral copies(<138 copies/mL). A negative result must be combined with clinical observations, patient history, and epidemiological information. The expected result is Negative.  Fact Sheet for Patients:  EntrepreneurPulse.com.au  Fact Sheet for Healthcare Providers:  IncredibleEmployment.be  This test is no t yet approved or cleared by the [place] FDA and  has been authorized for detection and/or diagnosis of SARS-CoV-2 by FDA under an Emergency Use Authorization (EUA). This EUA will remain  in effect (meaning this test can be used) for the duration of the COVID-19 declaration under Section 564(b)(1) of the Act, 21 U.S.C.section 360bbb-3(b)(1), unless the authorization is terminated  or revoked sooner.       Influenza A  by PCR NEGATIVE NEGATIVE Final   Influenza B by PCR NEGATIVE NEGATIVE Final    Comment: (NOTE) The Xpert Xpress SARS-CoV-2/FLU/RSV plus assay is intended as an aid in the diagnosis of influenza from Nasopharyngeal swab specimens and should not be used as a sole basis for treatment. Nasal washings and aspirates are unacceptable for Xpert Xpress SARS-CoV-2/FLU/RSV testing.  Fact Sheet for Patients: EntrepreneurPulse.com.au  Fact Sheet for Healthcare Providers: IncredibleEmployment.be  This test is not yet approved or cleared by the [place] FDA and has been authorized for detection and/or diagnosis of SARS-CoV-2 by FDA under an Emergency Use Authorization (EUA). This EUA will remain in effect (meaning this test can be used) for the duration of the COVID-19 declaration under Section 564(b)(1) of the Act, 21 U.S.C. section 360bbb-3(b)(1), unless the authorization is terminated or revoked.  Performed at [facility], [address] [place], [place] [address]   Resp Panel by RT-PCR (Flu A&B, Covid) Nasopharyngeal Swab     Status: None   Collection Time: 05/08/20  4:12 PM   Specimen: Nasopharyngeal Swab; Nasopharyngeal(NP) swabs in vial transport medium  Result Value Ref Range Status   SARS Coronavirus 2 by RT PCR NEGATIVE NEGATIVE Final    Comment: (NOTE) SARS-CoV-2 target nucleic acids are NOT DETECTED.  The SARS-CoV-2 RNA is generally detectable in upper respiratory specimens during the acute phase of infection. The lowest concentration of SARS-CoV-2 viral copies this assay can detect is 138 copies/mL. A negative result does not preclude SARS-Cov-2 infection and should not be used as the sole basis for treatment or other patient management decisions. A negative result may occur with  improper specimen collection/handling, submission of specimen other than nasopharyngeal swab, presence of viral mutation(s) within the areas  targeted by this assay, and inadequate number of viral copies(<138 copies/mL). A negative result must be combined with clinical observations, patient history, and epidemiological information. The expected result is Negative.  Fact Sheet for Patients:  EntrepreneurPulse.com.au  Fact Sheet for Healthcare Providers:  IncredibleEmployment.be  This test is no t yet approved or cleared by the [place] FDA and  has been authorized for detection and/or diagnosis of SARS-CoV-2 by FDA under an Emergency Use Authorization (EUA). This EUA will remain  in effect (meaning this test can be used) for the duration of the COVID-19 declaration under Section 564(b)(1) of the Act, 21 U.S.C.section 360bbb-3(b)(1), unless the authorization is terminated  or revoked sooner.       Influenza A by PCR NEGATIVE NEGATIVE Final   Influenza B by PCR NEGATIVE NEGATIVE Final    Comment: (NOTE) The Xpert Xpress SARS-CoV-2/FLU/RSV plus assay is intended as an aid in the diagnosis of influenza from Nasopharyngeal swab specimens and should not be used as a sole basis for  treatment. Nasal washings and aspirates are unacceptable for Xpert Xpress SARS-CoV-2/FLU/RSV testing.  Fact Sheet for Patients: EntrepreneurPulse.com.au  Fact Sheet for Healthcare Providers: IncredibleEmployment.be  This test is not yet approved or cleared by the [place] FDA and has been authorized for detection and/or diagnosis of SARS-CoV-2 by FDA under an Emergency Use Authorization (EUA). This EUA will remain in effect (meaning this test can be used) for the duration of the COVID-19 declaration under Section 564(b)(1) of the Act, 21 U.S.C. section 360bbb-3(b)(1), unless the authorization is terminated or revoked.  Performed at [facility], [place] [address], [place], [place] [address]       Radiology Studies: CT Head Wo Contrast  Result Date:  05/08/2020 CLINICAL DATA:  Head trauma, moderate/severe.  Fall. EXAM: CT HEAD WITHOUT CONTRAST TECHNIQUE: Contiguous axial images were obtained from the base of the skull through the vertex without intravenous contrast. COMPARISON:  Brain MRI 08/02/2018.  Head CT 08/02/2018. FINDINGS: Brain: Mild cerebral atrophy. Mild ill-defined hypoattenuation within the cerebral white matter is nonspecific, but compatible with chronic small vessel ischemic disease. There is no acute intracranial hemorrhage. No demarcated cortical infarct. No extra-axial fluid collection. No evidence of intracranial mass. No midline shift. Vascular: No hyperdense vessel.  Atherosclerotic calcifications. Skull: Normal. Negative for fracture or focal lesion. Sinuses/Orbits: Visualized orbits show no acute finding. Mild-to-moderate bilateral ethmoid sinus mucosal thickening. Small volume frothy secretions within the posterior left ethmoid air cell. Trace mucosal thickening within the visualized right maxillary sinus. Other: Subtle anterior scalp/forehead hematoma with possible laceration at this site. IMPRESSION: No evidence of acute intracranial abnormality. Anterior scalp/forehead hematoma with possible laceration at this site. Mild cerebral atrophy and chronic small vessel ischemic disease. Ethmoid sinusitis. Electronically Signed   By: Kellie Simmering DO   On: 05/08/2020 17:26   DG Chest Portable 1 View  Result Date: 05/08/2020 CLINICAL DATA:  Fall and shortness of breath. EXAM: PORTABLE CHEST 1 VIEW COMPARISON:  05/06/2020 FINDINGS: Cardiac silhouette is normal in size. No mediastinal or hilar masses. Clear lungs.  No pleural effusion or pneumothorax. Skeletal structures are grossly intact. IMPRESSION: No active disease. Electronically Signed   By: Lajean Manes M.D.   On: 05/08/2020 16:16      Scheduled Meds: . amiodarone  100 mg Oral Daily  . amLODipine  5 mg Oral Daily  . aspirin EC  81 mg Oral QHS  . [START ON 05/10/2020]  azithromycin  250 mg Oral Daily  . bisoprolol  20 mg Oral Daily  . DULoxetine  40 mg Oral Daily  . ezetimibe  10 mg Oral Daily  . ipratropium-albuterol  3 mL Nebulization Q6H WA  . lisinopril  40 mg Oral Daily  . mirtazapine  15 mg Oral QHS  . pantoprazole  40 mg Oral Daily  . pravastatin  80 mg Oral Daily  . predniSONE  40 mg Oral Q breakfast  . sodium chloride flush  3 mL Intravenous Q12H  . warfarin  7.5 mg Oral ONCE-1600  . Warfarin - Pharmacist Dosing Inpatient   Does not apply q1600   Continuous Infusions:   LOS: 0 days      Time spent: 35 minutes   Dessa Phi, DO [facility] 05/09/2020, 12:51 PM   Available via Epic secure chat 7am-7pm After these hours, please refer to coverage provider listed on [email or web address]

## 2020-05-09 NOTE — ED Notes (Signed)
Laceration on forehead cleaned, left open to air.   Bleeding controlled, denies any pain.

## 2020-05-09 NOTE — ED Notes (Signed)
Lunch Tray Ordered @ 1027. °

## 2020-05-09 NOTE — ED Notes (Signed)
Handoff given to ryland RN

## 2020-05-09 NOTE — Plan of Care (Signed)

## 2020-05-09 NOTE — ED Notes (Signed)
Breakfast given.  

## 2020-05-09 NOTE — ED Notes (Signed)
Patient resting in bed, no issues noted at this time.   BP 101/65   Pulse 63   Temp 98.9 F (37.2 C) (Oral)   Resp 10   Ht 6' (1.829 m)   Wt 83.9 kg   SpO2 97%   BMI 25.09 kg/m

## 2020-05-09 NOTE — Progress Notes (Signed)
ANTICOAGULATION CONSULT NOTE  Pharmacy Consult:  Coumadin Indication: atrial fibrillation  Allergies  Allergen Reactions  . Bactrim [Sulfamethoxazole-Trimethoprim] Anaphylaxis    Ulcers  . Ciprofloxacin Swelling    Tongue  . Crestor [Rosuvastatin Calcium]     Severe Myalgias    Patient Measurements: Height: 6' (182.9 cm) Weight: 83.9 kg (185 lb) IBW/kg (Calculated) : 77.6  Vital Signs: Temp: 98.5 F (36.9 C) (12/02 0930) Temp Source: Oral (12/02 0930) BP: 140/81 (12/02 1030) Pulse Rate: 76 (12/02 1030)  Labs: Recent Labs    05/08/20 1542 05/08/20 1743 05/09/20 0429  HGB 13.1  --  13.4  HCT 40.2  --  39.9  PLT 162  --  228  LABPROT 21.7*  --  20.0*  INR 2.0*  --  1.8*  CREATININE 0.97  --   --   TROPONINIHS 8 6  --     Estimated Creatinine Clearance: 73.3 mL/min (by C-G formula based on SCr of 0.97 mg/dL).   Assessment: Patient is a 41 yom that is being admitted for SOB.  Pharmacy consulted to continue Coumadin from PTA for history of Afib/CVA.  INR decreased to 1.8 today.  Noted patient is on azithromycin.  Scalp hematoma is covered with guaze per RN; no bleeding reported.  PTA Coumadin regimen: 3.75mg  MWF and 7.5mg  ROW   Goal of Therapy:  INR 2-3 Monitor platelets by anticoagulation protocol: Yes   Plan:  Coumadin 7.5mg  PO today Daily PT / INR Monitor hematoma status  Briunna Leicht D. Mina Marble, PharmD, BCPS, [place] 05/09/2020, 10:49 AM

## 2020-05-10 ENCOUNTER — Encounter (HOSPITAL_COMMUNITY): Payer: Self-pay | Admitting: Internal Medicine

## 2020-05-10 DIAGNOSIS — N400 Enlarged prostate without lower urinary tract symptoms: Secondary | ICD-10-CM | POA: Diagnosis present

## 2020-05-10 DIAGNOSIS — I69354 Hemiplegia and hemiparesis following cerebral infarction affecting left non-dominant side: Secondary | ICD-10-CM | POA: Diagnosis not present

## 2020-05-10 DIAGNOSIS — B974 Respiratory syncytial virus as the cause of diseases classified elsewhere: Secondary | ICD-10-CM | POA: Diagnosis present

## 2020-05-10 DIAGNOSIS — E785 Hyperlipidemia, unspecified: Secondary | ICD-10-CM | POA: Diagnosis present

## 2020-05-10 DIAGNOSIS — W19XXXA Unspecified fall, initial encounter: Secondary | ICD-10-CM | POA: Diagnosis present

## 2020-05-10 DIAGNOSIS — Z85828 Personal history of other malignant neoplasm of skin: Secondary | ICD-10-CM | POA: Diagnosis not present

## 2020-05-10 DIAGNOSIS — S0083XA Contusion of other part of head, initial encounter: Secondary | ICD-10-CM | POA: Diagnosis present

## 2020-05-10 DIAGNOSIS — K589 Irritable bowel syndrome without diarrhea: Secondary | ICD-10-CM | POA: Diagnosis present

## 2020-05-10 DIAGNOSIS — I10 Essential (primary) hypertension: Secondary | ICD-10-CM | POA: Diagnosis present

## 2020-05-10 DIAGNOSIS — Z955 Presence of coronary angioplasty implant and graft: Secondary | ICD-10-CM | POA: Diagnosis not present

## 2020-05-10 DIAGNOSIS — I252 Old myocardial infarction: Secondary | ICD-10-CM | POA: Diagnosis not present

## 2020-05-10 DIAGNOSIS — J40 Bronchitis, not specified as acute or chronic: Secondary | ICD-10-CM | POA: Diagnosis present

## 2020-05-10 DIAGNOSIS — Z7901 Long term (current) use of anticoagulants: Secondary | ICD-10-CM | POA: Diagnosis not present

## 2020-05-10 DIAGNOSIS — Z87892 Personal history of anaphylaxis: Secondary | ICD-10-CM | POA: Diagnosis not present

## 2020-05-10 DIAGNOSIS — K219 Gastro-esophageal reflux disease without esophagitis: Secondary | ICD-10-CM | POA: Diagnosis present

## 2020-05-10 DIAGNOSIS — J441 Chronic obstructive pulmonary disease with (acute) exacerbation: Secondary | ICD-10-CM | POA: Diagnosis present

## 2020-05-10 DIAGNOSIS — Z66 Do not resuscitate: Secondary | ICD-10-CM | POA: Diagnosis present

## 2020-05-10 DIAGNOSIS — I2510 Atherosclerotic heart disease of native coronary artery without angina pectoris: Secondary | ICD-10-CM | POA: Diagnosis present

## 2020-05-10 DIAGNOSIS — G4733 Obstructive sleep apnea (adult) (pediatric): Secondary | ICD-10-CM | POA: Diagnosis present

## 2020-05-10 DIAGNOSIS — F32A Depression, unspecified: Secondary | ICD-10-CM | POA: Diagnosis present

## 2020-05-10 DIAGNOSIS — Z79899 Other long term (current) drug therapy: Secondary | ICD-10-CM | POA: Diagnosis not present

## 2020-05-10 DIAGNOSIS — I480 Paroxysmal atrial fibrillation: Secondary | ICD-10-CM | POA: Diagnosis present

## 2020-05-10 DIAGNOSIS — Z9119 Patient's noncompliance with other medical treatment and regimen: Secondary | ICD-10-CM | POA: Diagnosis not present

## 2020-05-10 DIAGNOSIS — Z7982 Long term (current) use of aspirin: Secondary | ICD-10-CM | POA: Diagnosis not present

## 2020-05-10 DIAGNOSIS — Z20822 Contact with and (suspected) exposure to covid-19: Secondary | ICD-10-CM | POA: Diagnosis present

## 2020-05-10 DIAGNOSIS — F419 Anxiety disorder, unspecified: Secondary | ICD-10-CM | POA: Diagnosis present

## 2020-05-10 LAB — PROTIME-INR
INR: 2.3 — ABNORMAL HIGH (ref 0.8–1.2)
Prothrombin Time: 24.4 seconds — ABNORMAL HIGH (ref 11.4–15.2)

## 2020-05-10 MED ORDER — WARFARIN SODIUM 2.5 MG PO TABS
3.7500 mg | ORAL_TABLET | Freq: Once | ORAL | Status: AC
  Administered 2020-05-10: 3.75 mg via ORAL
  Filled 2020-05-10: qty 1

## 2020-05-10 MED ORDER — HYDROCOD POLST-CPM POLST ER 10-8 MG/5ML PO SUER
5.0000 mL | Freq: Two times a day (BID) | ORAL | Status: DC | PRN
  Administered 2020-05-10 – 2020-05-12 (×3): 5 mL via ORAL
  Filled 2020-05-10 (×3): qty 5

## 2020-05-10 NOTE — Progress Notes (Signed)
ANTICOAGULATION CONSULT NOTE  Pharmacy Consult:  Coumadin Indication: atrial fibrillation  Allergies  Allergen Reactions  . Bactrim [Sulfamethoxazole-Trimethoprim] Anaphylaxis    Ulcers  . Ciprofloxacin Swelling    Tongue  . Crestor [Rosuvastatin Calcium]     Severe Myalgias    Patient Measurements: Height: 6' (182.9 cm) Weight: 83.9 kg (185 lb) IBW/kg (Calculated) : 77.6  Vital Signs: Temp: 98.1 F (36.7 C) (12/03 0606) Temp Source: Oral (12/03 0606) BP: 127/86 (12/03 0606) Pulse Rate: 65 (12/03 0606)  Labs: Recent Labs    05/08/20 1542 05/08/20 1743 05/09/20 0429 05/10/20 0351  HGB 13.1  --  13.4  --   HCT 40.2  --  39.9  --   PLT 162  --  228  --   LABPROT 21.7*  --  20.0* 24.4*  INR 2.0*  --  1.8* 2.3*  CREATININE 0.97  --   --   --   TROPONINIHS 8 6  --   --     Estimated Creatinine Clearance: 73.3 mL/min (by C-G formula based on SCr of 0.97 mg/dL).   Assessment: Patient is a 50 yom that is being admitted for SOB.  Pharmacy consulted to continue Coumadin from PTA for history of Afib/CVA.  INR 2.3 today.  Noted patient is on azithromycin.  Scalp hematoma is covered with guaze per RN; no bleeding reported.  PTA Coumadin regimen: 3.75mg  MWF and 7.5mg  ROW   Goal of Therapy:  INR 2-3 Monitor platelets by anticoagulation protocol: Yes   Plan:  Coumadin 3.75mg  PO today Daily PT / INR  Barth Kirks, PharmD, BCPS, BCCCP Clinical Pharmacist [phone]  Please check AMION for all [facility] numbers  05/10/2020 10:56 AM

## 2020-05-10 NOTE — Progress Notes (Addendum)
PROGRESS NOTE    [name]  MBW:[id number] DOB: [birth year] DOA: 05/08/2020 PCP: [name], MD     Brief Narrative:  [name] is a 74 y.o. male with medical history significant of anxiety, CAD status post stent in 2006, hypertension, GERD, hyperlipidemia, IBS, CVA, sleep apnea, atrial fibrillation on anticoagulation, ?COPD who presents with several days of productive cough, shortness of breath, wheezing.  Patient presented to [facility] on 11/29 with cough and congestion for several weeks prior to presentation and he was treated for questionable pneumonia versus viral illness with course of Augmentin.  Symptoms have not improved and he has had severe wheezing since that time.  He is also had dyspnea on exertion. Vital signs in the ED are stable.  Chest x-ray was without acute abnormality, CT head without acute abnormality.  Patient was admitted for suspected COPD exacerbation with respiratory failure. RVP positive for RSV.   New events last 24 hours / Subjective: Still having coughing spells, wheezes.   Assessment & Plan:   Principal Problem:   COPD exacerbation ([place]) Active Problems:   AF (paroxysmal atrial fibrillation) (HCC)   CAD (coronary artery disease)   Hyperlipidemia   History of CVA (cerebrovascular accident)   Anxiety and depression   GERD (gastroesophageal reflux disease)   IBS (irritable bowel syndrome)   Essential hypertension   Sleep apnea   Chronic obstructive pulmonary disease (HCC)   Acute COPD exacerbation -Recommend formal PFT as outpatient -Continue prednisone, breathing treatment, azithromycin -RVP +RSV  Fall at home without syncope -CT head without evidence of acute intracranial abnormality.  Anterior scalp/forehead hematoma with possible laceration present  CAD -Without chest pain.  Negative troponin  Paroxysmal A. fib  -Continue amiodarone, bisoprolol, warfarin  Hypertension -Continue amlodipine, lisinopril  Hyperlipidemia -Continue  pravastatin, Zetia  History of CVA -Continue aspirin, warfarin, pravastatin, Zetia  GERD -Continue Protonix  Anxiety/depression -Continue duloxetine, mirtazapine  OSA -Noncompliant with CPAP at home   DVT prophylaxis:   warfarin (COUMADIN) tablet 3.75 mg  Code Status: DNR - confirmed with patient on 12/2 Family Communication: No family at bedside Disposition Plan:  Status is: Inpatient  Remains inpatient appropriate because:Inpatient level of care appropriate due to severity of illness   Dispo: The patient is from: Home              Anticipated d/c is to: Home              Anticipated d/c date is: 1 day              Patient currently is not medically stable to d/c. Continues to have bilateral wheezes and not at baseline status. Continue tx for COPD.          Consultants:   None  Procedures:   None  Antimicrobials:  Anti-infectives (From admission, onward)   Start     Dose/Rate Route Frequency Ordered Stop   05/10/20 1000  azithromycin (ZITHROMAX) tablet 250 mg       "Followed by" Linked Group Details   250 mg Oral Daily 05/09/20 0057 05/14/20 0959   05/09/20 0115  azithromycin (ZITHROMAX) tablet 500 mg       "Followed by" Linked Group Details   500 mg Oral Daily 05/09/20 0057 05/09/20 1020       Objective: Vitals:   05/09/20 1630 05/09/20 1727 05/09/20 2109 05/10/20 0606  BP: 140/81 133/82 133/78 127/86  Pulse: 73 73 72 65  Resp: 15 20 18 18   Temp:  99 F (37.2 C)  98.6 F (37 C) 98.1 F (36.7 C)  TempSrc:   Oral Oral  SpO2: 97% 95% 94% 95%  Weight:      Height:        Intake/Output Summary (Last 24 hours) at 05/10/2020 1142 Last data filed at 05/10/2020 0350 Gross per 24 hour  Intake --  Output 750 ml  Net -750 ml   Filed Weights   05/08/20 1526  Weight: 83.9 kg    Examination: General exam: Appears calm and comfortable  Respiratory system: Bilateral wheezes, dry cough, on room air without distress  Cardiovascular system: S1 & S2  heard, RRR. No pedal edema. Gastrointestinal system: Abdomen is nondistended, soft and nontender. Normal bowel sounds heard. Central nervous system: Alert and oriented. Non focal exam. Speech clear  Extremities: Symmetric in appearance bilaterally  Skin: No rashes, lesions or ulcers on exposed skin  Psychiatry: Judgement and insight appear stable. Mood & affect appropriate.    Data Reviewed: I have personally reviewed following labs and imaging studies  CBC: Recent Labs  Lab 05/06/20 1020 05/08/20 1542 05/09/20 0429  WBC 11.4* 6.2 6.1  NEUTROABS  --  4.2  --   HGB 14.1 13.1 13.4  HCT 41.6 40.2 39.9  MCV 93.5 95.7 93.0  PLT 194 162 027   Basic Metabolic Panel: Recent Labs  Lab 05/06/20 1020 05/08/20 1542  NA 138 138  K 4.1 3.7  CL 101 103  CO2 28 22  GLUCOSE 139* 124*  BUN 13 7*  CREATININE 1.07 0.97  CALCIUM 9.0 9.0   GFR: Estimated Creatinine Clearance: 73.3 mL/min (by C-G formula based on SCr of 0.97 mg/dL). Liver Function Tests: No results for input(s): AST, ALT, ALKPHOS, BILITOT, PROT, ALBUMIN in the last 168 hours. No results for input(s): LIPASE, AMYLASE in the last 168 hours. No results for input(s): AMMONIA in the last 168 hours. Coagulation Profile: Recent Labs  Lab 05/08/20 1542 05/09/20 0429 05/10/20 0351  INR 2.0* 1.8* 2.3*   Cardiac Enzymes: No results for input(s): CKTOTAL, CKMB, CKMBINDEX, TROPONINI in the last 168 hours. BNP (last 3 results) No results for input(s): PROBNP in the last 8760 hours. HbA1C: No results for input(s): HGBA1C in the last 72 hours. CBG: No results for input(s): GLUCAP in the last 168 hours. Lipid Profile: No results for input(s): CHOL, HDL, LDLCALC, TRIG, CHOLHDL, LDLDIRECT in the last 72 hours. Thyroid Function Tests: No results for input(s): TSH, T4TOTAL, FREET4, T3FREE, THYROIDAB in the last 72 hours. Anemia Panel: No results for input(s): VITAMINB12, FOLATE, FERRITIN, TIBC, IRON, RETICCTPCT in the last 72  hours. Sepsis Labs: No results for input(s): PROCALCITON, LATICACIDVEN in the last 168 hours.  Recent Results (from the past 240 hour(s))  Resp Panel by RT-PCR (Flu A&B, Covid) Nasopharyngeal Swab     Status: None   Collection Time: 05/06/20 12:03 PM   Specimen: Nasopharyngeal Swab; Nasopharyngeal(NP) swabs in vial transport medium  Result Value Ref Range Status   SARS Coronavirus 2 by RT PCR NEGATIVE NEGATIVE Final    Comment: (NOTE) SARS-CoV-2 target nucleic acids are NOT DETECTED.  The SARS-CoV-2 RNA is generally detectable in upper respiratory specimens during the acute phase of infection. The lowest concentration of SARS-CoV-2 viral copies this assay can detect is 138 copies/mL. A negative result does not preclude SARS-Cov-2 infection and should not be used as the sole basis for treatment or other patient management decisions. A negative result may occur with  improper specimen collection/handling, submission of specimen other than nasopharyngeal swab, presence  of viral mutation(s) within the areas targeted by this assay, and inadequate number of viral copies(<138 copies/mL). A negative result must be combined with clinical observations, patient history, and epidemiological information. The expected result is Negative.  Fact Sheet for Patients:  EntrepreneurPulse.com.au  Fact Sheet for Healthcare Providers:  IncredibleEmployment.be  This test is no t yet approved or cleared by the [place] FDA and  has been authorized for detection and/or diagnosis of SARS-CoV-2 by FDA under an Emergency Use Authorization (EUA). This EUA will remain  in effect (meaning this test can be used) for the duration of the COVID-19 declaration under Section 564(b)(1) of the Act, 21 U.S.C.section 360bbb-3(b)(1), unless the authorization is terminated  or revoked sooner.       Influenza A by PCR NEGATIVE NEGATIVE Final   Influenza B by PCR NEGATIVE  NEGATIVE Final    Comment: (NOTE) The Xpert Xpress SARS-CoV-2/FLU/RSV plus assay is intended as an aid in the diagnosis of influenza from Nasopharyngeal swab specimens and should not be used as a sole basis for treatment. Nasal washings and aspirates are unacceptable for Xpert Xpress SARS-CoV-2/FLU/RSV testing.  Fact Sheet for Patients: EntrepreneurPulse.com.au  Fact Sheet for Healthcare Providers: IncredibleEmployment.be  This test is not yet approved or cleared by the [place] FDA and has been authorized for detection and/or diagnosis of SARS-CoV-2 by FDA under an Emergency Use Authorization (EUA). This EUA will remain in effect (meaning this test can be used) for the duration of the COVID-19 declaration under Section 564(b)(1) of the Act, 21 U.S.C. section 360bbb-3(b)(1), unless the authorization is terminated or revoked.  Performed at [facility], [address] [place], [place] [address]   Resp Panel by RT-PCR (Flu A&B, Covid) Nasopharyngeal Swab     Status: None   Collection Time: 05/08/20  4:12 PM   Specimen: Nasopharyngeal Swab; Nasopharyngeal(NP) swabs in vial transport medium  Result Value Ref Range Status   SARS Coronavirus 2 by RT PCR NEGATIVE NEGATIVE Final    Comment: (NOTE) SARS-CoV-2 target nucleic acids are NOT DETECTED.  The SARS-CoV-2 RNA is generally detectable in upper respiratory specimens during the acute phase of infection. The lowest concentration of SARS-CoV-2 viral copies this assay can detect is 138 copies/mL. A negative result does not preclude SARS-Cov-2 infection and should not be used as the sole basis for treatment or other patient management decisions. A negative result may occur with  improper specimen collection/handling, submission of specimen other than nasopharyngeal swab, presence of viral mutation(s) within the areas targeted by this assay, and inadequate number of viral copies(<138  copies/mL). A negative result must be combined with clinical observations, patient history, and epidemiological information. The expected result is Negative.  Fact Sheet for Patients:  EntrepreneurPulse.com.au  Fact Sheet for Healthcare Providers:  IncredibleEmployment.be  This test is no t yet approved or cleared by the [place] FDA and  has been authorized for detection and/or diagnosis of SARS-CoV-2 by FDA under an Emergency Use Authorization (EUA). This EUA will remain  in effect (meaning this test can be used) for the duration of the COVID-19 declaration under Section 564(b)(1) of the Act, 21 U.S.C.section 360bbb-3(b)(1), unless the authorization is terminated  or revoked sooner.       Influenza A by PCR NEGATIVE NEGATIVE Final   Influenza B by PCR NEGATIVE NEGATIVE Final    Comment: (NOTE) The Xpert Xpress SARS-CoV-2/FLU/RSV plus assay is intended as an aid in the diagnosis of influenza from Nasopharyngeal swab specimens and should not be  used as a sole basis for treatment. Nasal washings and aspirates are unacceptable for Xpert Xpress SARS-CoV-2/FLU/RSV testing.  Fact Sheet for Patients: EntrepreneurPulse.com.au  Fact Sheet for Healthcare Providers: IncredibleEmployment.be  This test is not yet approved or cleared by the [place] FDA and has been authorized for detection and/or diagnosis of SARS-CoV-2 by FDA under an Emergency Use Authorization (EUA). This EUA will remain in effect (meaning this test can be used) for the duration of the COVID-19 declaration under Section 564(b)(1) of the Act, 21 U.S.C. section 360bbb-3(b)(1), unless the authorization is terminated or revoked.  Performed at [facility], [place] [address], [place], [place] [address]   Respiratory Panel by PCR     Status: Abnormal   Collection Time: 05/09/20  2:36 PM   Specimen: Nasopharyngeal Swab; Respiratory   Result Value Ref Range Status   Adenovirus NOT DETECTED NOT DETECTED Final   Coronavirus 229E NOT DETECTED NOT DETECTED Final    Comment: (NOTE) The Coronavirus on the Respiratory Panel, DOES NOT test for the novel  Coronavirus (2019 nCoV)    Coronavirus HKU1 NOT DETECTED NOT DETECTED Final   Coronavirus NL63 NOT DETECTED NOT DETECTED Final   Coronavirus OC43 NOT DETECTED NOT DETECTED Final   Metapneumovirus NOT DETECTED NOT DETECTED Final   Rhinovirus / Enterovirus NOT DETECTED NOT DETECTED Final   Influenza A NOT DETECTED NOT DETECTED Final   Influenza B NOT DETECTED NOT DETECTED Final   Parainfluenza Virus 1 NOT DETECTED NOT DETECTED Final   Parainfluenza Virus 2 NOT DETECTED NOT DETECTED Final   Parainfluenza Virus 3 NOT DETECTED NOT DETECTED Final   Parainfluenza Virus 4 NOT DETECTED NOT DETECTED Final   Respiratory Syncytial Virus DETECTED (A) NOT DETECTED Final   Bordetella pertussis NOT DETECTED NOT DETECTED Final   Chlamydophila pneumoniae NOT DETECTED NOT DETECTED Final   Mycoplasma pneumoniae NOT DETECTED NOT DETECTED Final    Comment: Performed at [facility], [place]. [address] [place], [place] [address]      Radiology Studies: CT Head Wo Contrast  Result Date: 05/08/2020 CLINICAL DATA:  Head trauma, moderate/severe.  Fall. EXAM: CT HEAD WITHOUT CONTRAST TECHNIQUE: Contiguous axial images were obtained from the base of the skull through the vertex without intravenous contrast. COMPARISON:  Brain MRI 08/02/2018.  Head CT 08/02/2018. FINDINGS: Brain: Mild cerebral atrophy. Mild ill-defined hypoattenuation within the cerebral white matter is nonspecific, but compatible with chronic small vessel ischemic disease. There is no acute intracranial hemorrhage. No demarcated cortical infarct. No extra-axial fluid collection. No evidence of intracranial mass. No midline shift. Vascular: No hyperdense vessel.  Atherosclerotic calcifications. Skull: Normal. Negative for fracture or  focal lesion. Sinuses/Orbits: Visualized orbits show no acute finding. Mild-to-moderate bilateral ethmoid sinus mucosal thickening. Small volume frothy secretions within the posterior left ethmoid air cell. Trace mucosal thickening within the visualized right maxillary sinus. Other: Subtle anterior scalp/forehead hematoma with possible laceration at this site. IMPRESSION: No evidence of acute intracranial abnormality. Anterior scalp/forehead hematoma with possible laceration at this site. Mild cerebral atrophy and chronic small vessel ischemic disease. Ethmoid sinusitis. Electronically Signed   By: Kellie Simmering DO   On: 05/08/2020 17:26   DG Chest Portable 1 View  Result Date: 05/08/2020 CLINICAL DATA:  Fall and shortness of breath. EXAM: PORTABLE CHEST 1 VIEW COMPARISON:  05/06/2020 FINDINGS: Cardiac silhouette is normal in size. No mediastinal or hilar masses. Clear lungs.  No pleural effusion or pneumothorax. Skeletal structures are grossly intact. IMPRESSION: No active disease. Electronically  Signed   By: Lajean Manes M.D.   On: 05/08/2020 16:16      Scheduled Meds: . amiodarone  100 mg Oral Daily  . amLODipine  5 mg Oral Daily  . aspirin EC  81 mg Oral QHS  . azithromycin  250 mg Oral Daily  . bisoprolol  20 mg Oral Daily  . DULoxetine  40 mg Oral Daily  . ezetimibe  10 mg Oral Daily  . lisinopril  40 mg Oral Daily  . mirtazapine  15 mg Oral QHS  . pantoprazole  40 mg Oral Daily  . pravastatin  80 mg Oral Daily  . predniSONE  40 mg Oral Q breakfast  . sodium chloride flush  3 mL Intravenous Q12H  . warfarin  3.75 mg Oral ONCE-1600  . Warfarin - Pharmacist Dosing Inpatient   Does not apply q1600   Continuous Infusions:   LOS: 0 days      Time spent: 35 minutes   Dessa Phi, DO [facility] 05/10/2020, 11:42 AM   Available via Epic secure chat 7am-7pm After these hours, please refer to coverage provider listed on [email or web address]

## 2020-05-10 NOTE — Plan of Care (Signed)
  Problem: Education: Goal: Knowledge of disease or condition will improve Outcome: Not Progressing Goal: Knowledge of the prescribed therapeutic regimen will improve Outcome: Not Progressing Goal: Individualized Educational Video(s) Outcome: Not Progressing   Problem: Activity: Goal: Ability to tolerate increased activity will improve Outcome: Not Progressing Goal: Will verbalize the importance of balancing activity with adequate rest periods Outcome: Not Progressing   Problem: Respiratory: Goal: Ability to maintain a clear airway will improve Outcome: Not Progressing Goal: Levels of oxygenation will improve Outcome: Not Progressing Goal: Ability to maintain adequate ventilation will improve Outcome: Not Progressing   

## 2020-05-10 NOTE — Care Management Obs Status (Signed)
[place] NOTIFICATION   Patient Details  Name: [name] MRN: [id number] Date of Birth: 12/25/1945   Medicare Observation Status Notification Given:  Yes    Joanne Chars, LCSW 05/10/2020, 8:40 AM

## 2020-05-11 ENCOUNTER — Encounter (HOSPITAL_COMMUNITY): Payer: Self-pay | Admitting: Internal Medicine

## 2020-05-11 LAB — PROTIME-INR
INR: 2.9 — ABNORMAL HIGH (ref 0.8–1.2)
Prothrombin Time: 29.6 seconds — ABNORMAL HIGH (ref 11.4–15.2)

## 2020-05-11 MED ORDER — IBUPROFEN 200 MG PO TABS: 400.0000 mg | ORAL_TABLET | Freq: Four times a day (QID) | ORAL | Status: DC | PRN

## 2020-05-11 MED ORDER — WARFARIN SODIUM 7.5 MG PO TABS: 7.5000 mg | ORAL_TABLET | Freq: Once | ORAL | Status: DC

## 2020-05-11 MED ORDER — WARFARIN SODIUM 2.5 MG PO TABS
3.7500 mg | ORAL_TABLET | Freq: Once | ORAL | Status: AC
  Administered 2020-05-11: 3.75 mg via ORAL
  Filled 2020-05-11: qty 1

## 2020-05-11 NOTE — Progress Notes (Signed)
ANTICOAGULATION CONSULT NOTE  Pharmacy Consult:  Coumadin Indication: atrial fibrillation  Allergies  Allergen Reactions  . Bactrim [Sulfamethoxazole-Trimethoprim] Anaphylaxis    Ulcers  . Ciprofloxacin Swelling    Tongue  . Crestor [Rosuvastatin Calcium]     Severe Myalgias    Patient Measurements: Height: 6' (182.9 cm) Weight: 83.9 kg (185 lb) IBW/kg (Calculated) : 77.6  Vital Signs: Temp: 98.1 F (36.7 C) (12/04 0900) Temp Source: Oral (12/04 0900) BP: 133/83 (12/04 0900) Pulse Rate: 67 (12/04 0900)  Labs: Recent Labs    05/08/20 1542 05/08/20 1542 05/08/20 1743 05/09/20 0429 05/10/20 0351 05/11/20 0106  HGB 13.1  --   --  13.4  --   --   HCT 40.2  --   --  39.9  --   --   PLT 162  --   --  228  --   --   LABPROT 21.7*   < >  --  20.0* 24.4* 29.6*  INR 2.0*   < >  --  1.8* 2.3* 2.9*  CREATININE 0.97  --   --   --   --   --   TROPONINIHS 8  --  6  --   --   --    < > = values in this interval not displayed.    Estimated Creatinine Clearance: 73.3 mL/min (by C-G formula based on SCr of 0.97 mg/dL).   Assessment: Patient is a 36 yom that is being admitted for SOB.  Pharmacy consulted to continue Coumadin from PTA for history of Afib/CVA.  INR 2.9 today.  Noted patient is on azithromycin.  Scalp hematoma is covered with guaze per RN; no bleeding reported.  PTA Coumadin regimen: 3.75mg  MWF and 7.5mg  ROW   Goal of Therapy:  INR 2-3 Monitor platelets by anticoagulation protocol: Yes   Plan:  Coumadin 3.75mg  PO today - since on azithro Daily PT / INR  Barth Kirks, PharmD, BCPS, BCCCP Clinical Pharmacist [phone]  Please check AMION for all [place] numbers  05/11/2020 10:06 AM

## 2020-05-11 NOTE — Plan of Care (Signed)

## 2020-05-11 NOTE — Plan of Care (Signed)
  Problem: Education: Goal: Knowledge of disease or condition will improve Outcome: Progressing Goal: Knowledge of the prescribed therapeutic regimen will improve Outcome: Progressing Goal: Individualized Educational Video(s) Outcome: Not Progressing   Problem: Activity: Goal: Ability to tolerate increased activity will improve Outcome: Progressing Goal: Will verbalize the importance of balancing activity with adequate rest periods Outcome: Not Progressing   Problem: Respiratory: Goal: Ability to maintain a clear airway will improve Outcome: Progressing Goal: Levels of oxygenation will improve Outcome: Progressing Goal: Ability to maintain adequate ventilation will improve Outcome: Progressing

## 2020-05-11 NOTE — Progress Notes (Signed)
PROGRESS NOTE    [name]  SKA:[id number] DOB: 1945/10/19 DOA: 05/08/2020 PCP: [name], MD     Brief Narrative:  [name] is a 74 y.o. male with medical history significant of anxiety, CAD status post stent in 2006, hypertension, GERD, hyperlipidemia, IBS, CVA, sleep apnea, atrial fibrillation on anticoagulation, ?COPD who presents with several days of productive cough, shortness of breath, wheezing.  Patient presented to [facility] on 11/29 with cough and congestion for several weeks prior to presentation and he was treated for questionable pneumonia versus viral illness with course of Augmentin.  Symptoms have not improved and he has had severe wheezing since that time.  He is also had dyspnea on exertion. Vital signs in the ED are stable.  Chest x-ray was without acute abnormality, CT head without acute abnormality.  Patient was admitted for suspected COPD exacerbation with respiratory failure. RVP positive for RSV.   New events last 24 hours / Subjective: Was able to ambulate down the hall, take a shower yesterday.  Continues to have cough which has worsened overnight.  Wheezes bilaterally.  Assessment & Plan:   Principal Problem:   COPD exacerbation ([facility]) Active Problems:   AF (paroxysmal atrial fibrillation) (HCC)   CAD (coronary artery disease)   Hyperlipidemia   History of CVA (cerebrovascular accident)   Anxiety and depression   GERD (gastroesophageal reflux disease)   IBS (irritable bowel syndrome)   Essential hypertension   Sleep apnea   Chronic obstructive pulmonary disease (HCC)   Acute COPD exacerbation -Recommend formal PFT as outpatient -Continue prednisone, breathing treatment, azithromycin -RVP +RSV  Fall at home without syncope -CT head without evidence of acute intracranial abnormality.  Anterior scalp/forehead hematoma with possible laceration present  CAD -Without chest pain.  Negative troponin  Paroxysmal A. fib  -Continue amiodarone,  bisoprolol, warfarin  Hypertension -Continue amlodipine, lisinopril  Hyperlipidemia -Continue pravastatin, Zetia  History of CVA -Continue aspirin, warfarin, pravastatin, Zetia  GERD -Continue Protonix  Anxiety/depression -Continue duloxetine, mirtazapine  OSA -Noncompliant with CPAP at home   DVT prophylaxis:   warfarin (COUMADIN) tablet 3.75 mg  Code Status: DNR - confirmed with patient on 12/2 Family Communication: No family at bedside Disposition Plan:  Status is: Inpatient  Remains inpatient appropriate because:Inpatient level of care appropriate due to severity of illness   Dispo: The patient is from: Home              Anticipated d/c is to: Home              Anticipated d/c date is: 1 day              Patient currently is not medically stable to d/c. Continues to have bilateral wheezes and not at baseline status. Continue tx for COPD.  Possible discharge home 1 to 2 days.    Consultants:   None  Procedures:   None  Antimicrobials:  Anti-infectives (From admission, onward)   Start     Dose/Rate Route Frequency Ordered Stop   05/10/20 1000  azithromycin (ZITHROMAX) tablet 250 mg       "Followed by" Linked Group Details   250 mg Oral Daily 05/09/20 0057 05/14/20 0959   05/09/20 0115  azithromycin (ZITHROMAX) tablet 500 mg       "Followed by" Linked Group Details   500 mg Oral Daily 05/09/20 0057 05/09/20 1020       Objective: Vitals:   05/10/20 0606 05/10/20 1602 05/10/20 2141 05/11/20 0900  BP: 127/86 121/75 131/83 133/83  Pulse: 65 61 69 67  Resp: 18 18 20 19   Temp: 98.1 F (36.7 C) 98.2 F (36.8 C) 98.1 F (36.7 C) 98.1 F (36.7 C)  TempSrc: Oral  Oral Oral  SpO2: 95% 95% 97% 97%  Weight:      Height:        Intake/Output Summary (Last 24 hours) at 05/11/2020 1017 Last data filed at 05/10/2020 2141 Gross per 24 hour  Intake --  Output 1 ml  Net -1 ml   Filed Weights   05/08/20 1526  Weight: 83.9 kg    Examination: General  exam: Appears calm and comfortable  Respiratory system: On room air, no conversational dyspnea. + Bilateral expiratory wheezes Cardiovascular system: S1 & S2 heard, RRR. No pedal edema. Gastrointestinal system: Abdomen is nondistended, soft and nontender. Normal bowel sounds heard. Central nervous system: Alert and oriented. Non focal exam. Speech clear  Extremities: Symmetric in appearance bilaterally  Skin: No rashes, lesions or ulcers on exposed skin  Psychiatry: Judgement and insight appear stable. Mood & affect appropriate.    Data Reviewed: I have personally reviewed following labs and imaging studies  CBC: Recent Labs  Lab 05/06/20 1020 05/08/20 1542 05/09/20 0429  WBC 11.4* 6.2 6.1  NEUTROABS  --  4.2  --   HGB 14.1 13.1 13.4  HCT 41.6 40.2 39.9  MCV 93.5 95.7 93.0  PLT 194 162 010   Basic Metabolic Panel: Recent Labs  Lab 05/06/20 1020 05/08/20 1542  NA 138 138  K 4.1 3.7  CL 101 103  CO2 28 22  GLUCOSE 139* 124*  BUN 13 7*  CREATININE 1.07 0.97  CALCIUM 9.0 9.0   GFR: Estimated Creatinine Clearance: 73.3 mL/min (by C-G formula based on SCr of 0.97 mg/dL). Liver Function Tests: No results for input(s): AST, ALT, ALKPHOS, BILITOT, PROT, ALBUMIN in the last 168 hours. No results for input(s): LIPASE, AMYLASE in the last 168 hours. No results for input(s): AMMONIA in the last 168 hours. Coagulation Profile: Recent Labs  Lab 05/08/20 1542 05/09/20 0429 05/10/20 0351 05/11/20 0106  INR 2.0* 1.8* 2.3* 2.9*   Cardiac Enzymes: No results for input(s): CKTOTAL, CKMB, CKMBINDEX, TROPONINI in the last 168 hours. BNP (last 3 results) No results for input(s): PROBNP in the last 8760 hours. HbA1C: No results for input(s): HGBA1C in the last 72 hours. CBG: No results for input(s): GLUCAP in the last 168 hours. Lipid Profile: No results for input(s): CHOL, HDL, LDLCALC, TRIG, CHOLHDL, LDLDIRECT in the last 72 hours. Thyroid Function Tests: No results for  input(s): TSH, T4TOTAL, FREET4, T3FREE, THYROIDAB in the last 72 hours. Anemia Panel: No results for input(s): VITAMINB12, FOLATE, FERRITIN, TIBC, IRON, RETICCTPCT in the last 72 hours. Sepsis Labs: No results for input(s): PROCALCITON, LATICACIDVEN in the last 168 hours.  Recent Results (from the past 240 hour(s))  Resp Panel by RT-PCR (Flu A&B, Covid) Nasopharyngeal Swab     Status: None   Collection Time: 05/06/20 12:03 PM   Specimen: Nasopharyngeal Swab; Nasopharyngeal(NP) swabs in vial transport medium  Result Value Ref Range Status   SARS Coronavirus 2 by RT PCR NEGATIVE NEGATIVE Final    Comment: (NOTE) SARS-CoV-2 target nucleic acids are NOT DETECTED.  The SARS-CoV-2 RNA is generally detectable in upper respiratory specimens during the acute phase of infection. The lowest concentration of SARS-CoV-2 viral copies this assay can detect is 138 copies/mL. A negative result does not preclude SARS-Cov-2 infection and should not be used as the sole basis for  treatment or other patient management decisions. A negative result may occur with  improper specimen collection/handling, submission of specimen other than nasopharyngeal swab, presence of viral mutation(s) within the areas targeted by this assay, and inadequate number of viral copies(<138 copies/mL). A negative result must be combined with clinical observations, patient history, and epidemiological information. The expected result is Negative.  Fact Sheet for Patients:  EntrepreneurPulse.com.au  Fact Sheet for Healthcare Providers:  IncredibleEmployment.be  This test is no t yet approved or cleared by the [place] FDA and  has been authorized for detection and/or diagnosis of SARS-CoV-2 by FDA under an Emergency Use Authorization (EUA). This EUA will remain  in effect (meaning this test can be used) for the duration of the COVID-19 declaration under Section 564(b)(1) of the Act,  21 U.S.C.section 360bbb-3(b)(1), unless the authorization is terminated  or revoked sooner.       Influenza A by PCR NEGATIVE NEGATIVE Final   Influenza B by PCR NEGATIVE NEGATIVE Final    Comment: (NOTE) The Xpert Xpress SARS-CoV-2/FLU/RSV plus assay is intended as an aid in the diagnosis of influenza from Nasopharyngeal swab specimens and should not be used as a sole basis for treatment. Nasal washings and aspirates are unacceptable for Xpert Xpress SARS-CoV-2/FLU/RSV testing.  Fact Sheet for Patients: EntrepreneurPulse.com.au  Fact Sheet for Healthcare Providers: IncredibleEmployment.be  This test is not yet approved or cleared by the [place] FDA and has been authorized for detection and/or diagnosis of SARS-CoV-2 by FDA under an Emergency Use Authorization (EUA). This EUA will remain in effect (meaning this test can be used) for the duration of the COVID-19 declaration under Section 564(b)(1) of the Act, 21 U.S.C. section 360bbb-3(b)(1), unless the authorization is terminated or revoked.  Performed at [facility], [address] [place], [place] [address]   Resp Panel by RT-PCR (Flu A&B, Covid) Nasopharyngeal Swab     Status: None   Collection Time: 05/08/20  4:12 PM   Specimen: Nasopharyngeal Swab; Nasopharyngeal(NP) swabs in vial transport medium  Result Value Ref Range Status   SARS Coronavirus 2 by RT PCR NEGATIVE NEGATIVE Final    Comment: (NOTE) SARS-CoV-2 target nucleic acids are NOT DETECTED.  The SARS-CoV-2 RNA is generally detectable in upper respiratory specimens during the acute phase of infection. The lowest concentration of SARS-CoV-2 viral copies this assay can detect is 138 copies/mL. A negative result does not preclude SARS-Cov-2 infection and should not be used as the sole basis for treatment or other patient management decisions. A negative result may occur with  improper specimen  collection/handling, submission of specimen other than nasopharyngeal swab, presence of viral mutation(s) within the areas targeted by this assay, and inadequate number of viral copies(<138 copies/mL). A negative result must be combined with clinical observations, patient history, and epidemiological information. The expected result is Negative.  Fact Sheet for Patients:  EntrepreneurPulse.com.au  Fact Sheet for Healthcare Providers:  IncredibleEmployment.be  This test is no t yet approved or cleared by the [place] FDA and  has been authorized for detection and/or diagnosis of SARS-CoV-2 by FDA under an Emergency Use Authorization (EUA). This EUA will remain  in effect (meaning this test can be used) for the duration of the COVID-19 declaration under Section 564(b)(1) of the Act, 21 U.S.C.section 360bbb-3(b)(1), unless the authorization is terminated  or revoked sooner.       Influenza A by PCR NEGATIVE NEGATIVE Final   Influenza B by PCR NEGATIVE NEGATIVE Final    Comment: (NOTE)  The Xpert Xpress SARS-CoV-2/FLU/RSV plus assay is intended as an aid in the diagnosis of influenza from Nasopharyngeal swab specimens and should not be used as a sole basis for treatment. Nasal washings and aspirates are unacceptable for Xpert Xpress SARS-CoV-2/FLU/RSV testing.  Fact Sheet for Patients: EntrepreneurPulse.com.au  Fact Sheet for Healthcare Providers: IncredibleEmployment.be  This test is not yet approved or cleared by the [place] FDA and has been authorized for detection and/or diagnosis of SARS-CoV-2 by FDA under an Emergency Use Authorization (EUA). This EUA will remain in effect (meaning this test can be used) for the duration of the COVID-19 declaration under Section 564(b)(1) of the Act, 21 U.S.C. section 360bbb-3(b)(1), unless the authorization is terminated or revoked.  Performed at [facility], [place] [address] [place], [place] [address]   Respiratory Panel by PCR     Status: Abnormal   Collection Time: 05/09/20  2:36 PM   Specimen: Nasopharyngeal Swab; Respiratory  Result Value Ref Range Status   Adenovirus NOT DETECTED NOT DETECTED Final   Coronavirus 229E NOT DETECTED NOT DETECTED Final    Comment: (NOTE) The Coronavirus on the Respiratory Panel, DOES NOT test for the novel  Coronavirus (2019 nCoV)    Coronavirus HKU1 NOT DETECTED NOT DETECTED Final   Coronavirus NL63 NOT DETECTED NOT DETECTED Final   Coronavirus OC43 NOT DETECTED NOT DETECTED Final   Metapneumovirus NOT DETECTED NOT DETECTED Final   Rhinovirus / Enterovirus NOT DETECTED NOT DETECTED Final   Influenza A NOT DETECTED NOT DETECTED Final   Influenza B NOT DETECTED NOT DETECTED Final   Parainfluenza Virus 1 NOT DETECTED NOT DETECTED Final   Parainfluenza Virus 2 NOT DETECTED NOT DETECTED Final   Parainfluenza Virus 3 NOT DETECTED NOT DETECTED Final   Parainfluenza Virus 4 NOT DETECTED NOT DETECTED Final   Respiratory Syncytial Virus DETECTED (A) NOT DETECTED Final   Bordetella pertussis NOT DETECTED NOT DETECTED Final   Chlamydophila pneumoniae NOT DETECTED NOT DETECTED Final   Mycoplasma pneumoniae NOT DETECTED NOT DETECTED Final    Comment: Performed at [facility], [place]. [address], [place], [place] [address]      Radiology Studies: No results found.    Scheduled Meds: . amiodarone  100 mg Oral Daily  . amLODipine  5 mg Oral Daily  . aspirin EC  81 mg Oral QHS  . azithromycin  250 mg Oral Daily  . bisoprolol  20 mg Oral Daily  . DULoxetine  40 mg Oral Daily  . ezetimibe  10 mg Oral Daily  . lisinopril  40 mg Oral Daily  . mirtazapine  15 mg Oral QHS  . pantoprazole  40 mg Oral Daily  . pravastatin  80 mg Oral Daily  . predniSONE  40 mg Oral Q breakfast  . sodium chloride flush  3 mL Intravenous Q12H  . warfarin  3.75 mg Oral ONCE-1600  . Warfarin - Pharmacist Dosing  Inpatient   Does not apply q1600   Continuous Infusions:   LOS: 1 day      Time spent: 25 minutes   Dessa Phi, DO [facility] 05/11/2020, 10:17 AM   Available via Epic secure chat 7am-7pm After these hours, please refer to coverage provider listed on [email or web address]

## 2020-05-12 ENCOUNTER — Encounter (HOSPITAL_COMMUNITY): Payer: Self-pay | Admitting: Internal Medicine

## 2020-05-12 LAB — PROTIME-INR
INR: 3.1 — ABNORMAL HIGH (ref 0.8–1.2)
Prothrombin Time: 30.9 seconds — ABNORMAL HIGH (ref 11.4–15.2)

## 2020-05-12 MED ORDER — ALBUTEROL SULFATE HFA 108 (90 BASE) MCG/ACT IN AERS: 2.0000 | INHALATION_SPRAY | Freq: Four times a day (QID) | RESPIRATORY_TRACT | 2 refills | Status: DC | PRN

## 2020-05-12 MED ORDER — PREDNISONE 10 MG PO TABS: ORAL_TABLET | ORAL | 0 refills | Status: DC

## 2020-05-12 MED ORDER — AZITHROMYCIN 250 MG PO TABS: 250.0000 mg | ORAL_TABLET | Freq: Every day | ORAL | 0 refills | Status: AC

## 2020-05-12 MED ORDER — WARFARIN SODIUM 2.5 MG PO TABS: 3.7500 mg | ORAL_TABLET | Freq: Once | ORAL | Status: DC

## 2020-05-12 NOTE — Plan of Care (Signed)
  Problem: Education: Goal: Knowledge of disease or condition will improve 05/12/2020 1041 by Shelton Silvas, RN Outcome: Adequate for Discharge 05/12/2020 1040 by Shelton Silvas, RN Outcome: Adequate for Discharge Goal: Knowledge of the prescribed therapeutic regimen will improve 05/12/2020 1041 by Shelton Silvas, RN Outcome: Adequate for Discharge 05/12/2020 1040 by Shelton Silvas, RN Outcome: Adequate for Discharge Goal: Individualized Educational Video(s) 05/12/2020 1041 by Shelton Silvas, RN Outcome: Adequate for Discharge 05/12/2020 1040 by Shelton Silvas, RN Outcome: Adequate for Discharge   Problem: Activity: Goal: Ability to tolerate increased activity will improve 05/12/2020 1041 by Shelton Silvas, RN Outcome: Adequate for Discharge 05/12/2020 1040 by Shelton Silvas, RN Outcome: Adequate for Discharge Goal: Will verbalize the importance of balancing activity with adequate rest periods 05/12/2020 1041 by Shelton Silvas, RN Outcome: Adequate for Discharge 05/12/2020 1040 by Shelton Silvas, RN Outcome: Adequate for Discharge   Problem: Respiratory: Goal: Ability to maintain a clear airway will improve 05/12/2020 1041 by Shelton Silvas, RN Outcome: Adequate for Discharge 05/12/2020 1040 by Shelton Silvas, RN Outcome: Adequate for Discharge Goal: Levels of oxygenation will improve 05/12/2020 1041 by Shelton Silvas, RN Outcome: Adequate for Discharge 05/12/2020 1040 by Shelton Silvas, RN Outcome: Adequate for Discharge Goal: Ability to maintain adequate ventilation will improve 05/12/2020 1041 by Shelton Silvas, RN Outcome: Adequate for Discharge 05/12/2020 1040 by Shelton Silvas, RN Outcome: Adequate for Discharge

## 2020-05-12 NOTE — Discharge Instructions (Signed)

## 2020-05-12 NOTE — Plan of Care (Signed)
  Problem: Education: Goal: Knowledge of disease or condition will improve 05/12/2020 1047 by Shelton Silvas, RN Outcome: Completed/Met 05/12/2020 1041 by Shelton Silvas, RN Outcome: Adequate for Discharge 05/12/2020 1041 by Shelton Silvas, RN Outcome: Adequate for Discharge 05/12/2020 1040 by Shelton Silvas, RN Outcome: Adequate for Discharge Goal: Knowledge of the prescribed therapeutic regimen will improve 05/12/2020 1047 by Shelton Silvas, RN Outcome: Completed/Met 05/12/2020 1041 by Shelton Silvas, RN Outcome: Adequate for Discharge 05/12/2020 1041 by Shelton Silvas, RN Outcome: Adequate for Discharge 05/12/2020 1040 by Shelton Silvas, RN Outcome: Adequate for Discharge Goal: Individualized Educational Video(s) 05/12/2020 1047 by Shelton Silvas, RN Outcome: Completed/Met 05/12/2020 1041 by Shelton Silvas, RN Outcome: Adequate for Discharge 05/12/2020 1041 by Shelton Silvas, RN Outcome: Adequate for Discharge 05/12/2020 1040 by Shelton Silvas, RN Outcome: Adequate for Discharge   Problem: Activity: Goal: Ability to tolerate increased activity will improve 05/12/2020 1047 by Shelton Silvas, RN Outcome: Completed/Met 05/12/2020 1041 by Shelton Silvas, RN Outcome: Adequate for Discharge 05/12/2020 1041 by Shelton Silvas, RN Outcome: Adequate for Discharge 05/12/2020 1040 by Shelton Silvas, RN Outcome: Adequate for Discharge Goal: Will verbalize the importance of balancing activity with adequate rest periods 05/12/2020 1047 by Shelton Silvas, RN Outcome: Completed/Met 05/12/2020 1041 by Shelton Silvas, RN Outcome: Adequate for Discharge 05/12/2020 1041 by Shelton Silvas, RN Outcome: Adequate for Discharge 05/12/2020 1040 by Shelton Silvas, RN Outcome: Adequate for Discharge   Problem: Respiratory: Goal: Ability to maintain a clear airway will improve 05/12/2020 1047 by Shelton Silvas, RN Outcome: Completed/Met 05/12/2020 1041 by Shelton Silvas, RN Outcome:  Adequate for Discharge 05/12/2020 1041 by Shelton Silvas, RN Outcome: Adequate for Discharge 05/12/2020 1040 by Shelton Silvas, RN Outcome: Adequate for Discharge Goal: Levels of oxygenation will improve 05/12/2020 1047 by Shelton Silvas, RN Outcome: Completed/Met 05/12/2020 1041 by Shelton Silvas, RN Outcome: Adequate for Discharge 05/12/2020 1041 by Shelton Silvas, RN Outcome: Adequate for Discharge 05/12/2020 1040 by Shelton Silvas, RN Outcome: Adequate for Discharge Goal: Ability to maintain adequate ventilation will improve 05/12/2020 1047 by Shelton Silvas, RN Outcome: Completed/Met 05/12/2020 1041 by Shelton Silvas, RN Outcome: Adequate for Discharge 05/12/2020 1041 by Shelton Silvas, RN Outcome: Adequate for Discharge 05/12/2020 1040 by Shelton Silvas, RN Outcome: Adequate for Discharge

## 2020-05-12 NOTE — Discharge Summary (Signed)
Physician Discharge Summary  [name] PQZ:[id number] DOB: 1945-11-05 DOA: 05/08/2020  PCP: [name], MD  Admit date: 05/08/2020 Discharge date: 05/12/2020  Admitted From: Home Disposition:  Home  Recommendations for Outpatient Follow-up:  1. Follow up with PCP in 1 week 2. Recommend outpatient PFT and pulmonology referral. Question if he will need to discontinue amiodarone, pending PFT results.   Discharge Condition: Stable CODE STATUS: DNR  Diet recommendation:  Diet Orders (From admission, onward)    Start     Ordered   05/10/20 0934  Diet regular Room service appropriate? Yes; Fluid consistency: Thin  Diet effective now       Question Answer Comment  Room service appropriate? Yes   Fluid consistency: Thin      05/10/20 0933         Brief/Interim Summary: William Docker Montgomeryis a 74 y.o.malewith medical history significant ofanxiety, CAD status post stent in 2006, hypertension, GERD, hyperlipidemia, IBS, CVA, sleep apnea, atrial fibrillation on anticoagulation, ?COPD who presents with several days of productive cough, shortness of breath, wheezing. Patient presented to [facility] on 11/29 with cough and congestion for several weeks prior to presentation and he was treated for questionable pneumonia versus viral illness with course of Augmentin. Symptoms have not improved and he has had severe wheezing since that time. He is also had dyspnea on exertion. Vital signs in the ED are stable. Chest x-ray was without acute abnormality, CT head without acute abnormality.  Patient was admitted for suspected COPD exacerbation with respiratory failure. RVP positive for RSV.   Discharge Diagnoses:  Principal Problem:   COPD exacerbation ([place]) Active Problems:   AF (paroxysmal atrial fibrillation) (HCC)   CAD (coronary artery disease)   Hyperlipidemia   History of CVA (cerebrovascular accident)   Anxiety and depression   GERD (gastroesophageal reflux disease)   IBS (irritable  bowel syndrome)   Essential hypertension   Sleep apnea   Chronic obstructive pulmonary disease (HCC)   Acute COPD exacerbation -Recommend formal PFT as outpatient -Continue prednisone, breathing treatment, azithromycin -RVP +RSV  Fall at home without syncope -CT head without evidence of acute intracranial abnormality.  Anterior scalp/forehead hematoma with possible laceration present  CAD -Without chest pain.  Negative troponin  Paroxysmal A. fib  -Continue amiodarone, bisoprolol, warfarin  Hypertension -Continue amlodipine, lisinopril  Hyperlipidemia -Continue pravastatin, Zetia  History of CVA -Continue aspirin, warfarin, pravastatin, Zetia  GERD -Continue Protonix  Anxiety/depression -Continue duloxetine, mirtazapine  OSA -Noncompliant with CPAP at home   Discharge Instructions  Discharge Instructions    Call MD for:  difficulty breathing, headache or visual disturbances   Complete by: As directed    Call MD for:  extreme fatigue   Complete by: As directed    Call MD for:  persistant dizziness or light-headedness   Complete by: As directed    Call MD for:  persistant nausea and vomiting   Complete by: As directed    Call MD for:  severe uncontrolled pain   Complete by: As directed    Call MD for:  temperature >100.4   Complete by: As directed    Discharge instructions   Complete by: As directed    You were cared for by a hospitalist during your hospital stay. If you have any questions about your discharge medications or the care you received while you were in the hospital after you are discharged, you can call the unit and ask to speak with the hospitalist on call if the hospitalist that took care  of you is not available. Once you are discharged, your primary care physician will handle any further medical issues. Please note that NO REFILLS for any discharge medications will be authorized once you are discharged, as it is imperative that you return  to your primary care physician (or establish a relationship with a primary care physician if you do not have one) for your aftercare needs so that they can reassess your need for medications and monitor your lab values.   Increase activity slowly   Complete by: As directed      Allergies as of 05/12/2020      Reactions   Bactrim [sulfamethoxazole-trimethoprim] Anaphylaxis   Ulcers   Ciprofloxacin Swelling   Tongue   Crestor [rosuvastatin Calcium]    Severe Myalgias      Medication List    STOP taking these medications   amoxicillin-clavulanate 875-125 MG tablet Commonly known as: Augmentin   triamcinolone 0.1 % Commonly known as: KENALOG     TAKE these medications   acetaminophen 500 MG tablet Commonly known as: TYLENOL Take 1,000 mg by mouth every 6 (six) hours as needed for mild pain.   albuterol 108 (90 Base) MCG/ACT inhaler Commonly known as: VENTOLIN HFA Inhale 2 puffs into the lungs every 6 (six) hours as needed for wheezing or shortness of breath.   amiodarone 100 MG tablet Commonly known as: PACERONE Take 1 tablet (100 mg total) by mouth daily.   amLODipine 5 MG tablet Commonly known as: NORVASC TAKE 1 TABLET BY MOUTH EVERY DAY FOR BLOOD PRESSURE What changed: See the new instructions.   aspirin 81 MG tablet Take 81 mg by mouth at bedtime.   azithromycin 250 MG tablet Commonly known as: ZITHROMAX Take 1 tablet (250 mg total) by mouth daily for 1 day. To finish hospital course   bisoprolol 10 MG tablet Commonly known as: ZEBETA TAKE 1 AND 1/2 TABLETS BY MOUTH EVERY DAY FOR BLOOD PRESSURE. What changed: See the new instructions.   DULoxetine 20 MG capsule Commonly known as: CYMBALTA Take 40 mg by mouth daily.   ezetimibe 10 MG tablet Commonly known as: ZETIA Take 1 tablet (10 mg total) by mouth daily.   furosemide 20 MG tablet Commonly known as: LASIX Take 20 mg by mouth daily as needed for fluid or edema.   lisinopril 40 MG tablet Commonly  known as: ZESTRIL TAKE 1 TABLET (40 MG TOTAL) BY MOUTH DAILY. FOR BLOOD PRESSURE.   mirtazapine 15 MG tablet Commonly known as: REMERON Take 15 mg by mouth at bedtime.   multivitamin with minerals tablet Take 1 tablet by mouth daily. Centrum Silver   nitroGLYCERIN 0.4 MG SL tablet Commonly known as: NITROSTAT Place 1 tablet (0.4 mg total) every 5 (five) minutes as needed under the tongue for chest pain.   omeprazole 40 MG capsule Commonly known as: PRILOSEC TAKE 1 CAPSULE (40 MG TOTAL) BY MOUTH 2 (TWO) TIMES DAILY BEFORE A MEAL. FOR HEARTBURN. What changed: See the new instructions.   potassium chloride 10 MEQ tablet Commonly known as: KLOR-CON Take 10 mEq by mouth daily as needed (low Potassium). Only take with lasix   pravastatin 80 MG tablet Commonly known as: PRAVACHOL Take 1 tablet (80 mg total) by mouth daily. For cholesterol.   predniSONE 10 MG tablet Commonly known as: DELTASONE Take 4 tabs for 3 days, then 3 tabs for 3 days, then 2 tabs for 3 days, then 1 tab for 3 days, then 1/2 tab for 4 days.  Vitamin D3 50 MCG (2000 UT) Tabs Take 2,000 Units by mouth at bedtime.   warfarin 7.5 MG tablet Commonly known as: COUMADIN Take as directed. If you are unsure how to take this medication, talk to your nurse or doctor. Original instructions: Take 0.5-1 tablets (3.75-7.5 mg total) by mouth See admin instructions. Take 7.5mg  on sundays, all other days take 3.75mg  or as directed by coumadin clinic. What changed: additional instructions       Follow-up Information    [name], MD. Schedule an appointment as soon as possible for a visit in 1 week(s).   Specialty: Family Medicine Why: Or establish with another provider, if desired. Recommend outpatient PFT and pulmonology referral  Contact information: [address] [place] [address] [phone]              Allergies  Allergen Reactions  . Bactrim [Sulfamethoxazole-Trimethoprim]  Anaphylaxis    Ulcers  . Ciprofloxacin Swelling    Tongue  . Crestor [Rosuvastatin Calcium]     Severe Myalgias    Consultations:  None    Procedures/Studies: DG Chest 2 View  Result Date: 05/06/2020 CLINICAL DATA:  Chest pain.  Shortness of breath. EXAM: CHEST - 2 VIEW COMPARISON:  None. FINDINGS: The heart size and mediastinal contours are within normal limits. Mild left basilar opacities. No visible pleural effusions or pneumothorax. No acute osseous abnormality. IMPRESSION: Mild left basilar opacities, which may represent atelectasis, aspiration and/or pneumonia. Electronically Signed   By: Margaretha Sheffield MD   On: 05/06/2020 10:52   CT Head Wo Contrast  Result Date: 05/08/2020 CLINICAL DATA:  Head trauma, moderate/severe.  Fall. EXAM: CT HEAD WITHOUT CONTRAST TECHNIQUE: Contiguous axial images were obtained from the base of the skull through the vertex without intravenous contrast. COMPARISON:  Brain MRI 08/02/2018.  Head CT 08/02/2018. FINDINGS: Brain: Mild cerebral atrophy. Mild ill-defined hypoattenuation within the cerebral white matter is nonspecific, but compatible with chronic small vessel ischemic disease. There is no acute intracranial hemorrhage. No demarcated cortical infarct. No extra-axial fluid collection. No evidence of intracranial mass. No midline shift. Vascular: No hyperdense vessel.  Atherosclerotic calcifications. Skull: Normal. Negative for fracture or focal lesion. Sinuses/Orbits: Visualized orbits show no acute finding. Mild-to-moderate bilateral ethmoid sinus mucosal thickening. Small volume frothy secretions within the posterior left ethmoid air cell. Trace mucosal thickening within the visualized right maxillary sinus. Other: Subtle anterior scalp/forehead hematoma with possible laceration at this site. IMPRESSION: No evidence of acute intracranial abnormality. Anterior scalp/forehead hematoma with possible laceration at this site. Mild cerebral atrophy and  chronic small vessel ischemic disease. Ethmoid sinusitis. Electronically Signed   By: Kellie Simmering DO   On: 05/08/2020 17:26   DG Chest Portable 1 View  Result Date: 05/08/2020 CLINICAL DATA:  Fall and shortness of breath. EXAM: PORTABLE CHEST 1 VIEW COMPARISON:  05/06/2020 FINDINGS: Cardiac silhouette is normal in size. No mediastinal or hilar masses. Clear lungs.  No pleural effusion or pneumothorax. Skeletal structures are grossly intact. IMPRESSION: No active disease. Electronically Signed   By: Lajean Manes M.D.   On: 05/08/2020 16:16       Discharge Exam: Vitals:   05/12/20 0622 05/12/20 0902  BP: 132/61 136/61  Pulse:    Resp: 18   Temp: 97.8 F (36.6 C)   SpO2: 95%     General: Pt is alert, awake, not in acute distress Cardiovascular: RRR, S1/S2 +, no edema Respiratory: CTA bilaterally, no wheezing, no rhonchi, no respiratory distress, no conversational  dyspnea, on room air  Abdominal: Soft, NT, ND, bowel sounds + Extremities: no edema, no cyanosis Psych: Normal mood and affect, stable judgement and insight     The results of significant diagnostics from this hospitalization (including imaging, microbiology, ancillary and laboratory) are listed below for reference.     Microbiology: Recent Results (from the past 240 hour(s))  Resp Panel by RT-PCR (Flu A&B, Covid) Nasopharyngeal Swab     Status: None   Collection Time: 05/06/20 12:03 PM   Specimen: Nasopharyngeal Swab; Nasopharyngeal(NP) swabs in vial transport medium  Result Value Ref Range Status   SARS Coronavirus 2 by RT PCR NEGATIVE NEGATIVE Final    Comment: (NOTE) SARS-CoV-2 target nucleic acids are NOT DETECTED.  The SARS-CoV-2 RNA is generally detectable in upper respiratory specimens during the acute phase of infection. The lowest concentration of SARS-CoV-2 viral copies this assay can detect is 138 copies/mL. A negative result does not preclude SARS-Cov-2 infection and should not be used as the sole  basis for treatment or other patient management decisions. A negative result may occur with  improper specimen collection/handling, submission of specimen other than nasopharyngeal swab, presence of viral mutation(s) within the areas targeted by this assay, and inadequate number of viral copies(<138 copies/mL). A negative result must be combined with clinical observations, patient history, and epidemiological information. The expected result is Negative.  Fact Sheet for Patients:  EntrepreneurPulse.com.au  Fact Sheet for Healthcare Providers:  IncredibleEmployment.be  This test is no t yet approved or cleared by the [place] FDA and  has been authorized for detection and/or diagnosis of SARS-CoV-2 by FDA under an Emergency Use Authorization (EUA). This EUA will remain  in effect (meaning this test can be used) for the duration of the COVID-19 declaration under Section 564(b)(1) of the Act, 21 U.S.C.section 360bbb-3(b)(1), unless the authorization is terminated  or revoked sooner.       Influenza A by PCR NEGATIVE NEGATIVE Final   Influenza B by PCR NEGATIVE NEGATIVE Final    Comment: (NOTE) The Xpert Xpress SARS-CoV-2/FLU/RSV plus assay is intended as an aid in the diagnosis of influenza from Nasopharyngeal swab specimens and should not be used as a sole basis for treatment. Nasal washings and aspirates are unacceptable for Xpert Xpress SARS-CoV-2/FLU/RSV testing.  Fact Sheet for Patients: EntrepreneurPulse.com.au  Fact Sheet for Healthcare Providers: IncredibleEmployment.be  This test is not yet approved or cleared by the [place] FDA and has been authorized for detection and/or diagnosis of SARS-CoV-2 by FDA under an Emergency Use Authorization (EUA). This EUA will remain in effect (meaning this test can be used) for the duration of the COVID-19 declaration under Section 564(b)(1) of the Act,  21 U.S.C. section 360bbb-3(b)(1), unless the authorization is terminated or revoked.  Performed at [facility], [address] [place], [place] [address]   Resp Panel by RT-PCR (Flu A&B, Covid) Nasopharyngeal Swab     Status: None   Collection Time: 05/08/20  4:12 PM   Specimen: Nasopharyngeal Swab; Nasopharyngeal(NP) swabs in vial transport medium  Result Value Ref Range Status   SARS Coronavirus 2 by RT PCR NEGATIVE NEGATIVE Final    Comment: (NOTE) SARS-CoV-2 target nucleic acids are NOT DETECTED.  The SARS-CoV-2 RNA is generally detectable in upper respiratory specimens during the acute phase of infection. The lowest concentration of SARS-CoV-2 viral copies this assay can detect is 138 copies/mL. A negative result does not preclude SARS-Cov-2 infection and should not be used as the sole basis for treatment  or other patient management decisions. A negative result may occur with  improper specimen collection/handling, submission of specimen other than nasopharyngeal swab, presence of viral mutation(s) within the areas targeted by this assay, and inadequate number of viral copies(<138 copies/mL). A negative result must be combined with clinical observations, patient history, and epidemiological information. The expected result is Negative.  Fact Sheet for Patients:  EntrepreneurPulse.com.au  Fact Sheet for Healthcare Providers:  IncredibleEmployment.be  This test is no t yet approved or cleared by the [place] FDA and  has been authorized for detection and/or diagnosis of SARS-CoV-2 by FDA under an Emergency Use Authorization (EUA). This EUA will remain  in effect (meaning this test can be used) for the duration of the COVID-19 declaration under Section 564(b)(1) of the Act, 21 U.S.C.section 360bbb-3(b)(1), unless the authorization is terminated  or revoked sooner.       Influenza A by PCR NEGATIVE NEGATIVE Final    Influenza B by PCR NEGATIVE NEGATIVE Final    Comment: (NOTE) The Xpert Xpress SARS-CoV-2/FLU/RSV plus assay is intended as an aid in the diagnosis of influenza from Nasopharyngeal swab specimens and should not be used as a sole basis for treatment. Nasal washings and aspirates are unacceptable for Xpert Xpress SARS-CoV-2/FLU/RSV testing.  Fact Sheet for Patients: EntrepreneurPulse.com.au  Fact Sheet for Healthcare Providers: IncredibleEmployment.be  This test is not yet approved or cleared by the [place] FDA and has been authorized for detection and/or diagnosis of SARS-CoV-2 by FDA under an Emergency Use Authorization (EUA). This EUA will remain in effect (meaning this test can be used) for the duration of the COVID-19 declaration under Section 564(b)(1) of the Act, 21 U.S.C. section 360bbb-3(b)(1), unless the authorization is terminated or revoked.  Performed at [facility], [place] [address] [place], [place] [address]   Respiratory Panel by PCR     Status: Abnormal   Collection Time: 05/09/20  2:36 PM   Specimen: Nasopharyngeal Swab; Respiratory  Result Value Ref Range Status   Adenovirus NOT DETECTED NOT DETECTED Final   Coronavirus 229E NOT DETECTED NOT DETECTED Final    Comment: (NOTE) The Coronavirus on the Respiratory Panel, DOES NOT test for the novel  Coronavirus (2019 nCoV)    Coronavirus HKU1 NOT DETECTED NOT DETECTED Final   Coronavirus NL63 NOT DETECTED NOT DETECTED Final   Coronavirus OC43 NOT DETECTED NOT DETECTED Final   Metapneumovirus NOT DETECTED NOT DETECTED Final   Rhinovirus / Enterovirus NOT DETECTED NOT DETECTED Final   Influenza A NOT DETECTED NOT DETECTED Final   Influenza B NOT DETECTED NOT DETECTED Final   Parainfluenza Virus 1 NOT DETECTED NOT DETECTED Final   Parainfluenza Virus 2 NOT DETECTED NOT DETECTED Final   Parainfluenza Virus 3 NOT DETECTED NOT DETECTED Final   Parainfluenza Virus 4 NOT  DETECTED NOT DETECTED Final   Respiratory Syncytial Virus DETECTED (A) NOT DETECTED Final   Bordetella pertussis NOT DETECTED NOT DETECTED Final   Chlamydophila pneumoniae NOT DETECTED NOT DETECTED Final   Mycoplasma pneumoniae NOT DETECTED NOT DETECTED Final    Comment: Performed at [facility], [place]. [address], [place], [place] [address]     Labs: BNP (last 3 results) Recent Labs    07/14/19 1439 05/08/20 1543  BNP 270* 280.0*   Basic Metabolic Panel: Recent Labs  Lab 05/06/20 1020 05/08/20 1542  NA 138 138  K 4.1 3.7  CL 101 103  CO2 28 22  GLUCOSE 139* 124*  BUN 13 7*  CREATININE 1.07 0.97  CALCIUM 9.0 9.0   Liver Function Tests: No results for input(s): AST, ALT, ALKPHOS, BILITOT, PROT, ALBUMIN in the last 168 hours. No results for input(s): LIPASE, AMYLASE in the last 168 hours. No results for input(s): AMMONIA in the last 168 hours. CBC: Recent Labs  Lab 05/06/20 1020 05/08/20 1542 05/09/20 0429  WBC 11.4* 6.2 6.1  NEUTROABS  --  4.2  --   HGB 14.1 13.1 13.4  HCT 41.6 40.2 39.9  MCV 93.5 95.7 93.0  PLT 194 162 228   Cardiac Enzymes: No results for input(s): CKTOTAL, CKMB, CKMBINDEX, TROPONINI in the last 168 hours. BNP: Invalid input(s): POCBNP CBG: No results for input(s): GLUCAP in the last 168 hours. D-Dimer No results for input(s): DDIMER in the last 72 hours. Hgb A1c No results for input(s): HGBA1C in the last 72 hours. Lipid Profile No results for input(s): CHOL, HDL, LDLCALC, TRIG, CHOLHDL, LDLDIRECT in the last 72 hours. Thyroid function studies No results for input(s): TSH, T4TOTAL, T3FREE, THYROIDAB in the last 72 hours.  Invalid input(s): FREET3 Anemia work up No results for input(s): VITAMINB12, FOLATE, FERRITIN, TIBC, IRON, RETICCTPCT in the last 72 hours. Urinalysis    Component Value Date/Time   COLORURINE YELLOW 08/02/2018 1655   APPEARANCEUR CLEAR 08/02/2018 1655   LABSPEC 1.012 08/02/2018 1655   PHURINE 6.0  08/02/2018 1655   GLUCOSEU NEGATIVE 08/02/2018 1655   HGBUR NEGATIVE 08/02/2018 1655   BILIRUBINUR Negative 04/21/2019 [place] 08/02/2018 1655   PROTEINUR Positive (A) 04/21/2019 1545   PROTEINUR NEGATIVE 08/02/2018 1655   UROBILINOGEN 0.2 04/21/2019 1545   NITRITE Negative 04/21/2019 1545   NITRITE NEGATIVE 08/02/2018 1655   LEUKOCYTESUR Negative 04/21/2019 1545   LEUKOCYTESUR NEGATIVE 08/02/2018 1655   Sepsis Labs Invalid input(s): PROCALCITONIN,  WBC,  LACTICIDVEN Microbiology Recent Results (from the past 240 hour(s))  Resp Panel by RT-PCR (Flu A&B, Covid) Nasopharyngeal Swab     Status: None   Collection Time: 05/06/20 12:03 PM   Specimen: Nasopharyngeal Swab; Nasopharyngeal(NP) swabs in vial transport medium  Result Value Ref Range Status   SARS Coronavirus 2 by RT PCR NEGATIVE NEGATIVE Final    Comment: (NOTE) SARS-CoV-2 target nucleic acids are NOT DETECTED.  The SARS-CoV-2 RNA is generally detectable in upper respiratory specimens during the acute phase of infection. The lowest concentration of SARS-CoV-2 viral copies this assay can detect is 138 copies/mL. A negative result does not preclude SARS-Cov-2 infection and should not be used as the sole basis for treatment or other patient management decisions. A negative result may occur with  improper specimen collection/handling, submission of specimen other than nasopharyngeal swab, presence of viral mutation(s) within the areas targeted by this assay, and inadequate number of viral copies(<138 copies/mL). A negative result must be combined with clinical observations, patient history, and epidemiological information. The expected result is Negative.  Fact Sheet for Patients:  EntrepreneurPulse.com.au  Fact Sheet for Healthcare Providers:  IncredibleEmployment.be  This test is no t yet approved or cleared by the [place] FDA and  has been authorized for  detection and/or diagnosis of SARS-CoV-2 by FDA under an Emergency Use Authorization (EUA). This EUA will remain  in effect (meaning this test can be used) for the duration of the COVID-19 declaration under Section 564(b)(1) of the Act, 21 U.S.C.section 360bbb-3(b)(1), unless the authorization is terminated  or revoked sooner.       Influenza A by PCR NEGATIVE NEGATIVE Final   Influenza B by PCR  NEGATIVE NEGATIVE Final    Comment: (NOTE) The Xpert Xpress SARS-CoV-2/FLU/RSV plus assay is intended as an aid in the diagnosis of influenza from Nasopharyngeal swab specimens and should not be used as a sole basis for treatment. Nasal washings and aspirates are unacceptable for Xpert Xpress SARS-CoV-2/FLU/RSV testing.  Fact Sheet for Patients: EntrepreneurPulse.com.au  Fact Sheet for Healthcare Providers: IncredibleEmployment.be  This test is not yet approved or cleared by the [place] FDA and has been authorized for detection and/or diagnosis of SARS-CoV-2 by FDA under an Emergency Use Authorization (EUA). This EUA will remain in effect (meaning this test can be used) for the duration of the COVID-19 declaration under Section 564(b)(1) of the Act, 21 U.S.C. section 360bbb-3(b)(1), unless the authorization is terminated or revoked.  Performed at [facility], [address] [place], [place] [address]   Resp Panel by RT-PCR (Flu A&B, Covid) Nasopharyngeal Swab     Status: None   Collection Time: 05/08/20  4:12 PM   Specimen: Nasopharyngeal Swab; Nasopharyngeal(NP) swabs in vial transport medium  Result Value Ref Range Status   SARS Coronavirus 2 by RT PCR NEGATIVE NEGATIVE Final    Comment: (NOTE) SARS-CoV-2 target nucleic acids are NOT DETECTED.  The SARS-CoV-2 RNA is generally detectable in upper respiratory specimens during the acute phase of infection. The lowest concentration of SARS-CoV-2 viral copies this assay can detect  is 138 copies/mL. A negative result does not preclude SARS-Cov-2 infection and should not be used as the sole basis for treatment or other patient management decisions. A negative result may occur with  improper specimen collection/handling, submission of specimen other than nasopharyngeal swab, presence of viral mutation(s) within the areas targeted by this assay, and inadequate number of viral copies(<138 copies/mL). A negative result must be combined with clinical observations, patient history, and epidemiological information. The expected result is Negative.  Fact Sheet for Patients:  EntrepreneurPulse.com.au  Fact Sheet for Healthcare Providers:  IncredibleEmployment.be  This test is no t yet approved or cleared by the [place] FDA and  has been authorized for detection and/or diagnosis of SARS-CoV-2 by FDA under an Emergency Use Authorization (EUA). This EUA will remain  in effect (meaning this test can be used) for the duration of the COVID-19 declaration under Section 564(b)(1) of the Act, 21 U.S.C.section 360bbb-3(b)(1), unless the authorization is terminated  or revoked sooner.       Influenza A by PCR NEGATIVE NEGATIVE Final   Influenza B by PCR NEGATIVE NEGATIVE Final    Comment: (NOTE) The Xpert Xpress SARS-CoV-2/FLU/RSV plus assay is intended as an aid in the diagnosis of influenza from Nasopharyngeal swab specimens and should not be used as a sole basis for treatment. Nasal washings and aspirates are unacceptable for Xpert Xpress SARS-CoV-2/FLU/RSV testing.  Fact Sheet for Patients: EntrepreneurPulse.com.au  Fact Sheet for Healthcare Providers: IncredibleEmployment.be  This test is not yet approved or cleared by the [place] FDA and has been authorized for detection and/or diagnosis of SARS-CoV-2 by FDA under an Emergency Use Authorization (EUA). This EUA will remain in effect  (meaning this test can be used) for the duration of the COVID-19 declaration under Section 564(b)(1) of the Act, 21 U.S.C. section 360bbb-3(b)(1), unless the authorization is terminated or revoked.  Performed at [facility], [place] [address] [place], [place] [address]   Respiratory Panel by PCR     Status: Abnormal   Collection Time: 05/09/20  2:36 PM   Specimen: Nasopharyngeal Swab; Respiratory  Result Value  Ref Range Status   Adenovirus NOT DETECTED NOT DETECTED Final   Coronavirus 229E NOT DETECTED NOT DETECTED Final    Comment: (NOTE) The Coronavirus on the Respiratory Panel, DOES NOT test for the novel  Coronavirus (2019 nCoV)    Coronavirus HKU1 NOT DETECTED NOT DETECTED Final   Coronavirus NL63 NOT DETECTED NOT DETECTED Final   Coronavirus OC43 NOT DETECTED NOT DETECTED Final   Metapneumovirus NOT DETECTED NOT DETECTED Final   Rhinovirus / Enterovirus NOT DETECTED NOT DETECTED Final   Influenza A NOT DETECTED NOT DETECTED Final   Influenza B NOT DETECTED NOT DETECTED Final   Parainfluenza Virus 1 NOT DETECTED NOT DETECTED Final   Parainfluenza Virus 2 NOT DETECTED NOT DETECTED Final   Parainfluenza Virus 3 NOT DETECTED NOT DETECTED Final   Parainfluenza Virus 4 NOT DETECTED NOT DETECTED Final   Respiratory Syncytial Virus DETECTED (A) NOT DETECTED Final   Bordetella pertussis NOT DETECTED NOT DETECTED Final   Chlamydophila pneumoniae NOT DETECTED NOT DETECTED Final   Mycoplasma pneumoniae NOT DETECTED NOT DETECTED Final    Comment: Performed at [facility], [place] [address] [place], [place] [address]     Patient was seen and examined on the day of discharge and was found to be in stable condition. Time coordinating discharge: 25 minutes including assessment and coordination of care, as well as examination of the patient.   SIGNED:  Dessa Phi, DO [facility] 05/12/2020, 9:48 AM

## 2020-05-12 NOTE — Progress Notes (Signed)
ANTICOAGULATION CONSULT NOTE  Pharmacy Consult:  Coumadin Indication: atrial fibrillation  Allergies  Allergen Reactions   Bactrim [Sulfamethoxazole-Trimethoprim] Anaphylaxis    Ulcers   Ciprofloxacin Swelling    Tongue   Crestor [Rosuvastatin Calcium]     Severe Myalgias    Patient Measurements: Height: 6' (182.9 cm) Weight: 83.9 kg (185 lb) IBW/kg (Calculated) : 77.6  Vital Signs: Temp: 97.8 F (36.6 C) (12/05 0622) Temp Source: Oral (12/05 0622) BP: 136/61 (12/05 0902)  Labs: Recent Labs    05/10/20 0351 05/11/20 0106 05/12/20 0029  LABPROT 24.4* 29.6* 30.9*  INR 2.3* 2.9* 3.1*    Estimated Creatinine Clearance: 73.3 mL/min (by C-G formula based on SCr of 0.97 mg/dL).   Assessment: Patient is a 74 yom that is being admitted for SOB.  Pharmacy consulted to continue Coumadin from PTA for history of Afib/CVA.  INR 3.1 today.  Noted patient is on azithromycin.  Scalp hematoma is covered with guaze per RN; no bleeding reported.  PTA Coumadin regimen: 3.75mg  MWF and 7.5mg  ROW   Goal of Therapy:  INR 2-3 Monitor platelets by anticoagulation protocol: Yes   Plan:  Coumadin 3.75mg  PO today - since on azitho Daily PT / INR  Barth Kirks, PharmD, BCPS, BCCCP Clinical Pharmacist [phone]  Please check AMION for all [facility] numbers  05/12/2020 10:23 AM

## 2020-05-12 NOTE — Progress Notes (Signed)
Patient iv removed, AVS explained to patient and wife, teach back completed, all belongings with patient, transported via wheelchair to family car for transport home.

## 2020-05-12 NOTE — Plan of Care (Signed)
  Problem: Education: Goal: Knowledge of disease or condition will improve Outcome: Adequate for Discharge Goal: Knowledge of the prescribed therapeutic regimen will improve Outcome: Adequate for Discharge Goal: Individualized Educational Video(s) Outcome: Adequate for Discharge   Problem: Activity: Goal: Ability to tolerate increased activity will improve Outcome: Adequate for Discharge Goal: Will verbalize the importance of balancing activity with adequate rest periods Outcome: Adequate for Discharge   Problem: Respiratory: Goal: Ability to maintain a clear airway will improve Outcome: Adequate for Discharge Goal: Levels of oxygenation will improve Outcome: Adequate for Discharge Goal: Ability to maintain adequate ventilation will improve Outcome: Adequate for Discharge   

## 2020-05-12 NOTE — Plan of Care (Signed)
  Problem: Education: Goal: Knowledge of disease or condition will improve 05/12/2020 1041 by Shelton Silvas, RN Outcome: Adequate for Discharge 05/12/2020 1041 by Shelton Silvas, RN Outcome: Adequate for Discharge 05/12/2020 1040 by Shelton Silvas, RN Outcome: Adequate for Discharge Goal: Knowledge of the prescribed therapeutic regimen will improve 05/12/2020 1041 by Shelton Silvas, RN Outcome: Adequate for Discharge 05/12/2020 1041 by Shelton Silvas, RN Outcome: Adequate for Discharge 05/12/2020 1040 by Shelton Silvas, RN Outcome: Adequate for Discharge Goal: Individualized Educational Video(s) 05/12/2020 1041 by Shelton Silvas, RN Outcome: Adequate for Discharge 05/12/2020 1041 by Shelton Silvas, RN Outcome: Adequate for Discharge 05/12/2020 1040 by Shelton Silvas, RN Outcome: Adequate for Discharge   Problem: Activity: Goal: Ability to tolerate increased activity will improve 05/12/2020 1041 by Shelton Silvas, RN Outcome: Adequate for Discharge 05/12/2020 1041 by Shelton Silvas, RN Outcome: Adequate for Discharge 05/12/2020 1040 by Shelton Silvas, RN Outcome: Adequate for Discharge Goal: Will verbalize the importance of balancing activity with adequate rest periods 05/12/2020 1041 by Shelton Silvas, RN Outcome: Adequate for Discharge 05/12/2020 1041 by Shelton Silvas, RN Outcome: Adequate for Discharge 05/12/2020 1040 by Shelton Silvas, RN Outcome: Adequate for Discharge   Problem: Respiratory: Goal: Ability to maintain a clear airway will improve 05/12/2020 1041 by Shelton Silvas, RN Outcome: Adequate for Discharge 05/12/2020 1041 by Shelton Silvas, RN Outcome: Adequate for Discharge 05/12/2020 1040 by Shelton Silvas, RN Outcome: Adequate for Discharge Goal: Levels of oxygenation will improve 05/12/2020 1041 by Shelton Silvas, RN Outcome: Adequate for Discharge 05/12/2020 1041 by Shelton Silvas, RN Outcome: Adequate for Discharge 05/12/2020 1040 by Shelton Silvas, RN Outcome: Adequate for Discharge Goal: Ability to maintain adequate ventilation will improve 05/12/2020 1041 by Shelton Silvas, RN Outcome: Adequate for Discharge 05/12/2020 1041 by Shelton Silvas, RN Outcome: Adequate for Discharge 05/12/2020 1040 by Shelton Silvas, RN Outcome: Adequate for Discharge

## 2020-05-15 DIAGNOSIS — J121 Respiratory syncytial virus pneumonia: Secondary | ICD-10-CM | POA: Diagnosis not present

## 2020-05-15 DIAGNOSIS — J441 Chronic obstructive pulmonary disease with (acute) exacerbation: Secondary | ICD-10-CM | POA: Diagnosis not present

## 2020-05-20 DIAGNOSIS — R0982 Postnasal drip: Secondary | ICD-10-CM | POA: Diagnosis not present

## 2020-05-20 DIAGNOSIS — Z01818 Encounter for other preprocedural examination: Secondary | ICD-10-CM | POA: Diagnosis not present

## 2020-05-20 DIAGNOSIS — Z7901 Long term (current) use of anticoagulants: Secondary | ICD-10-CM | POA: Diagnosis not present

## 2020-05-20 DIAGNOSIS — Z8701 Personal history of pneumonia (recurrent): Secondary | ICD-10-CM | POA: Diagnosis not present

## 2020-05-23 DIAGNOSIS — Z7901 Long term (current) use of anticoagulants: Secondary | ICD-10-CM | POA: Diagnosis not present

## 2020-05-27 DIAGNOSIS — Z7901 Long term (current) use of anticoagulants: Secondary | ICD-10-CM | POA: Diagnosis not present

## 2020-06-02 NOTE — Progress Notes (Signed)
Date:  06/03/2020   ID:  [name], DOB 07-11-1945, MRN [id number]  Patient Location:  [place] [id number]   Provider location:   [facility], [place] office  PCP:  [name], MD  Cardiologist:  [facility]   Chief Complaint  Patient presents with  . Follow-up    4 Months follow up. Medications verbally reviewed with patient.     History of Present Illness:    [name] is a 74 y.o. male  past medical history of Chronic alcohol abuse,  wine per night 3 to 5+ per night PAF,  prior stroke treated with TPA with mild residual left sided weakness,  Depression with chronic fatigue,  chronic anticoagulation therapy with Coumadin  h/o CAD s/p PCI to the LCx in 2007.  mini stroke in 2015 seen on PET scan, did not have symptoms and was on Coumadin at the time. We previously ordered stress test for chest pain, he did not complete this Presents for f/u of his CAD, chest pain  Last seen in clinic March 2021 In the hospital December 2021 with bronchitis, RSV COPD exacerbation Still feels weak today, slowly recovering  Discussed prior cardiac catheterization with him Reported unstable angina symptoms August 2021 In the hospital August 2021 heart catheterization Nonobstructive coronary disease, Right heart pressures were normal  Ports having some orthostasis symptoms Recent weight loss in the setting of hospitalization with RSV, 5 pounds Blood pressure low at home Wonders if he can cut back on some of his medications  EKG personally reviewed by myself on todays visit Shows normal sinus rhythm with rate 63 bpm  Not on repatha, "cant afford it", patient assistance appears to have run out On pravastatin 1 month, does not feel this is causing his muscle ache  Several weeks ago, almost fainted in shower Etiology unclear Was SOB, weak Recovered without intervention  sednetary at baseline with no regular  exercise program, walking with a cane today  Recent lab work reviewed BNP 270 No swelling Total chol 148, LDL67 HCT 13.8  EKG personally reviewed by myself on todays visit Shows normal sinus rhythm rate 61 bpm no significant ST-T wave changes  On prior office visit reported having Chronic insomnia  working with pulmonary in [place] Reports having obstructive sleep apnea and central sleep apnea Tried bipap  event monitor  1 long stretch of atrial fibrillation lasting over 24 hours otherwise maintain normal sinus rhythm, 6% burden A. Fib  Previous retinal tear, needed laser treatment Concerned it was brought on by the Coumadin, ophthalmology did not think so per the patient   Prior CV studies:   The following studies were reviewed today:   Past Medical History:  Diagnosis Date  . BPH (benign prostatic hyperplasia)   . Coronary artery disease    a. s/p PCI to LCx in 2007; b. MV 2017 no sig ischemia, EF 59%, nl study  . Depression   . GERD (gastroesophageal reflux disease)   . Hyperlipidemia   . Hypertension   . IBS (irritable bowel syndrome)   . Myocardial infarction ([place])    12-13 yrs ago   . PAF (paroxysmal atrial fibrillation) ([place])    a. CHADS2VASc => 5 (HTN, age x 1, stroke x 2, vascular disease); b. on Coumadin   . PNA (pneumonia)   . Skin cancer    face   . Sleep apnea    off cpap   . Stroke [facility])  a. x 2 with residual left-sided weakness   Past Surgical History:  Procedure Laterality Date  . ADENOIDECTOMY  1951  . CARDIAC CATHETERIZATION  2006   X1 STENT  . COLONOSCOPY    . CORONARY ANGIOPLASTY    . POLYPECTOMY    . RIGHT/LEFT HEART CATH AND CORONARY ANGIOGRAPHY N/A 01/25/2020   Procedure: RIGHT/LEFT HEART CATH AND CORONARY ANGIOGRAPHY;  Surgeon: Minna Merritts, MD;  Location: [facility] [facility];  Service: Cardiovascular;  Laterality: N/A;  . SKIN CANCER EXCISION N/A 08/2016   [place].  [facility] (Dr. Deliah Boston)  .  THYROIDECTOMY, PARTIAL    . UPPER GASTROINTESTINAL ENDOSCOPY    . VASCULAR SURGERY     varicose vein stripping left leg     Current Meds  Medication Sig  . acetaminophen (TYLENOL) 500 MG tablet Take 1,000 mg by mouth every 6 (six) hours as needed for mild pain.  Marland Kitchen albuterol (VENTOLIN HFA) 108 (90 Base) MCG/ACT inhaler Inhale 2 puffs into the lungs every 6 (six) hours as needed for wheezing or shortness of breath.  Marland Kitchen amiodarone (PACERONE) 100 MG tablet Take 1 tablet (100 mg total) by mouth daily.  Marland Kitchen amLODipine (NORVASC) 5 MG tablet TAKE 1 TABLET BY MOUTH EVERY DAY FOR BLOOD PRESSURE (Patient taking differently: Take 5 mg by mouth daily.)  . aspirin 81 MG tablet Take 81 mg by mouth at bedtime.   . bisoprolol (ZEBETA) 10 MG tablet TAKE 1 AND 1/2 TABLETS BY MOUTH EVERY DAY FOR BLOOD PRESSURE. (Patient taking differently: Take 15 mg by mouth daily.)  . Cholecalciferol (VITAMIN D3) 50 MCG (2000 UT) TABS Take 2,000 Units by mouth at bedtime.   . DULoxetine (CYMBALTA) 20 MG capsule Take 40 mg by mouth daily.  Marland Kitchen ezetimibe (ZETIA) 10 MG tablet Take 1 tablet (10 mg total) by mouth daily.  . fluticasone (FLONASE) 50 MCG/ACT nasal spray Place into both nostrils.  . furosemide (LASIX) 20 MG tablet Take 20 mg by mouth daily as needed for fluid or edema.   . hydrocortisone 2.5 % cream Apply topically daily as needed.  Marland Kitchen KLOR-CON M10 10 MEQ tablet Take 10 mEq by mouth daily.  Marland Kitchen lisinopril (ZESTRIL) 40 MG tablet TAKE 1 TABLET (40 MG TOTAL) BY MOUTH DAILY. FOR BLOOD PRESSURE.  . mirtazapine (REMERON) 15 MG tablet Take 15 mg by mouth at bedtime.  . Multiple Vitamins-Minerals (MULTIVITAMIN WITH MINERALS) tablet Take 1 tablet by mouth daily. Centrum Silver  . nitroGLYCERIN (NITROSTAT) 0.4 MG SL tablet Place 1 tablet (0.4 mg total) every 5 (five) minutes as needed under the tongue for chest pain.  Marland Kitchen omeprazole (PRILOSEC) 40 MG capsule TAKE 1 CAPSULE (40 MG TOTAL) BY MOUTH 2 (TWO) TIMES DAILY BEFORE A MEAL. FOR  HEARTBURN. (Patient taking differently: Take 40 mg by mouth in the morning and at bedtime.)  . pravastatin (PRAVACHOL) 80 MG tablet Take 1 tablet (80 mg total) by mouth daily. For cholesterol.  . warfarin (COUMADIN) 7.5 MG tablet Take 0.5-1 tablets (3.75-7.5 mg total) by mouth See admin instructions. Take 7.5mg  on sundays, all other days take 3.75mg  or as directed by coumadin clinic. (Patient taking differently: Take 3.75-7.5 mg by mouth See admin instructions. Take 3.75mg  on Monday,Wednesday and Fridays, 7.5 mg all the other days.)  . [DISCONTINUED] potassium chloride (KLOR-CON) 10 MEQ tablet Take 10 mEq by mouth daily as needed (low Potassium). Only take with lasix     Allergies:   Bactrim [sulfamethoxazole-trimethoprim], Ciprofloxacin, and Crestor [rosuvastatin calcium]  Social History   Tobacco Use  . Smoking status: Former Smoker    Years: 20.00    Types: Cigarettes  . Smokeless tobacco: Never Used  Vaping Use  . Vaping Use: Never used  Substance Use Topics  . Alcohol use: Yes    Alcohol/week: 2.0 - 3.0 standard drinks    Types: 2 - 3 Glasses of wine per week    Comment: Wine daily with dinner most days  . Drug use: No     Family Hx: The patient's family history includes Arthritis in his mother; Cancer in his maternal aunt and mother; Heart Problems in his father; Heart disease in his father, paternal grandfather, and paternal grandmother; Hypertension in his father; Mental illness in his mother; Stroke in his maternal grandmother and paternal uncle; Sudden death in his maternal grandfather. There is no history of Colon cancer, Esophageal cancer, Rectal cancer, Colon polyps, or Stomach cancer.  ROS:   Please see the history of present illness.    Review of Systems  Constitutional: Negative.   HENT: Negative.   Respiratory: Negative.   Cardiovascular: Negative.   Gastrointestinal: Negative.   Musculoskeletal: Negative.        Leg weakness  Neurological: Negative.    Psychiatric/Behavioral: Negative.   All other systems reviewed and are negative.    Labs/Other Tests and Data Reviewed:    Recent Labs: 03/14/2020: ALT 46 05/08/2020: B Natriuretic Peptide 178.9; BUN 7; Creatinine, Ser 0.97; Potassium 3.7; Sodium 138 05/09/2020: Hemoglobin 13.4; Platelets 228   Recent Lipid Panel Lab Results  Component Value Date/Time   CHOL 227 (H) 03/14/2020 09:24 AM   TRIG 250 (H) 03/14/2020 09:24 AM   HDL 46 03/14/2020 09:24 AM   CHOLHDL 4.9 03/14/2020 09:24 AM   LDLCALC 131 (H) 03/14/2020 09:24 AM   LDLCALC 67 07/14/2019 02:39 PM    Wt Readings from Last 3 Encounters:  06/03/20 186 lb (84.4 kg)  05/08/20 185 lb (83.9 kg)  05/06/20 189 lb (85.7 kg)     Exam:    BP 110/70 (BP Location: Left Arm, Patient Position: Sitting, Cuff Size: Normal)   Pulse 63   Ht 6' (1.829 m)   Wt 186 lb (84.4 kg)   SpO2 98%   BMI 25.23 kg/m  Constitutional:  oriented to person, place, and time. No distress.  HENT:  Head: Grossly normal Eyes:  no discharge. No scleral icterus.  Neck: No JVD, no carotid bruits  Cardiovascular: Regular rate and rhythm, no murmurs appreciated Pulmonary/Chest: Clear to auscultation bilaterally, no wheezes or rails Abdominal: Soft.  no distension.  no tenderness.  Musculoskeletal: Normal range of motion Neurological:  normal muscle tone. Coordination normal. No atrophy Skin: Skin warm and dry Psychiatric: normal affect, pleasant   ASSESSMENT & PLAN:    Paroxysmal atrial fibrillation (HCC) - On warfarin, Maintaining normal sinus rhythm Previous discussion to monitor rhythm when he has episodes of shortness of breath  Shortness of breath Recovering from RSV Deconditioned, recommended regular walking program  Ataxia Prior stroke Walks with a cane  Coronary artery disease involving native coronary artery of native heart without angina pectoris -  Recent catheterization with nonobstructive disease  History of stroke -  Typically  has Lovenox bridge when coming off his warfarin  Essential hypertension -  Low blood pressure, recommended he hold amlodipine He is having some orthostasis symptoms at home If he continues to have symptoms without amlodipine recommended he call us in 1 week, additional medication changes could be made  Hyperlipidemia LDL goal <70 -  Cholesterol at goal He does not want to take Zetia at this time, will stay on pravastatin  Somnolence -  History of alcohol, depression, sedentary lifestyle, insomnia Recommended active lifestyle  Dyspnea on exertion - Recommend walking program for conditioning Recent catheterization, right heart pressures were normal   Total encounter time more than 25 minutes  Greater than 50% was spent in counseling and coordination of care with the patient   Signed, Ida Rogue, MD  06/03/2020 4:51 PM    [place] Office [address], [place], [place] [address]

## 2020-06-03 ENCOUNTER — Ambulatory Visit: Payer: PPO | Admitting: Cardiovascular Disease

## 2020-06-03 ENCOUNTER — Ambulatory Visit (INDEPENDENT_AMBULATORY_CARE_PROVIDER_SITE_OTHER): Payer: PPO

## 2020-06-03 ENCOUNTER — Other Ambulatory Visit: Payer: Self-pay

## 2020-06-03 ENCOUNTER — Encounter: Payer: Self-pay | Admitting: Cardiovascular Disease

## 2020-06-03 VITALS — BP 110/70 | HR 63 | Ht 72.0 in | Wt 186.0 lb

## 2020-06-03 DIAGNOSIS — R002 Palpitations: Secondary | ICD-10-CM

## 2020-06-03 DIAGNOSIS — I5032 Chronic diastolic (congestive) heart failure: Secondary | ICD-10-CM | POA: Diagnosis not present

## 2020-06-03 DIAGNOSIS — E785 Hyperlipidemia, unspecified: Secondary | ICD-10-CM

## 2020-06-03 DIAGNOSIS — E782 Mixed hyperlipidemia: Secondary | ICD-10-CM | POA: Diagnosis not present

## 2020-06-03 DIAGNOSIS — I10 Essential (primary) hypertension: Secondary | ICD-10-CM | POA: Diagnosis not present

## 2020-06-03 DIAGNOSIS — G720 Drug-induced myopathy: Secondary | ICD-10-CM

## 2020-06-03 DIAGNOSIS — Z7901 Long term (current) use of anticoagulants: Secondary | ICD-10-CM

## 2020-06-03 DIAGNOSIS — T466X5D Adverse effect of antihyperlipidemic and antiarteriosclerotic drugs, subsequent encounter: Secondary | ICD-10-CM

## 2020-06-03 DIAGNOSIS — I480 Paroxysmal atrial fibrillation: Secondary | ICD-10-CM | POA: Diagnosis not present

## 2020-06-03 DIAGNOSIS — I25118 Atherosclerotic heart disease of native coronary artery with other forms of angina pectoris: Secondary | ICD-10-CM | POA: Diagnosis not present

## 2020-06-03 LAB — POCT INR: INR: 3.5 — AB (ref 2.0–3.0)

## 2020-06-03 NOTE — Patient Instructions (Signed)
-   skip warfarin tonight, then  - continue warfarin dosage 1 tablet every day EXCEPT 1/2 tablet on MONDAYS, [place]. - Recheck INR in 5 weeks

## 2020-06-03 NOTE — Patient Instructions (Addendum)
Medication Instructions:  Hold amlodipine  Ok to back off on the zetia,  Stick with the pravastatin  If you need a refill on your cardiac medications before your next appointment, please call your pharmacy.    Lab work: No new labs needed   If you have labs (blood work) drawn today and your tests are completely normal, you will receive your results only by:  MyChart Message (if you have MyChart) OR  A paper copy in the mail If you have any lab test that is abnormal or we need to change your treatment, we will call you to review the results.   Testing/Procedures: No new testing needed   Follow-Up: At [facility], you and your health needs are our priority.  As part of our continuing mission to provide you with exceptional heart care, we have created designated Provider Care Teams.  These Care Teams include your primary Cardiologist (physician) and Advanced Practice Providers (APPs -  Physician Assistants and Nurse Practitioners) who all work together to provide you with the care you need, when you need it.   You will need a follow up appointment in 12 months   Providers on your designated Care Team:    Nicolasa Ducking, NP  Eula Listen, PA-C  Marisue Ivan, PA-C  Any Other Special Instructions Will Be Listed Below (If Applicable).  COVID-19 Vaccine Information can be found at: PodExchange.nl For questions related to vaccine distribution or appointments, please email [email or web address] or call [phone].

## 2020-06-10 DIAGNOSIS — X32XXXA Exposure to sunlight, initial encounter: Secondary | ICD-10-CM | POA: Diagnosis not present

## 2020-06-10 DIAGNOSIS — Z08 Encounter for follow-up examination after completed treatment for malignant neoplasm: Secondary | ICD-10-CM | POA: Diagnosis not present

## 2020-06-10 DIAGNOSIS — Z85828 Personal history of other malignant neoplasm of skin: Secondary | ICD-10-CM | POA: Diagnosis not present

## 2020-06-10 DIAGNOSIS — L570 Actinic keratosis: Secondary | ICD-10-CM | POA: Diagnosis not present

## 2020-06-10 DIAGNOSIS — D225 Melanocytic nevi of trunk: Secondary | ICD-10-CM | POA: Diagnosis not present

## 2020-06-10 DIAGNOSIS — Z85820 Personal history of malignant melanoma of skin: Secondary | ICD-10-CM | POA: Diagnosis not present

## 2020-06-14 ENCOUNTER — Telehealth: Payer: Self-pay | Admitting: Cardiovascular Disease

## 2020-06-14 NOTE — Telephone Encounter (Signed)
Return pt call regarding this am he noted himself in two different runs of A-fib, reports took his BP in between the two times of A-fib and reports HR of 90 and BP 149/90, denies CP or shob, but does reports feeling tired after the two A-fib occurrence, does not know HR while in A-fib, his machine tells him he is in A-fib. At last visit with Dr. Rockey Situ 06/04/2020, he told Mr. Geibel to hold Amlodipine 5mg  daily "since I was doing so good", but he remains on amiodarone 100mg  daily for A-fib. Advised will send Dr. Rockey Situ a message, not in clinic today and Monday will be hospital and clinic d/t one provider out, pt reports he understood, feels like he wants to start his amlodipine 5mg  daily, he will take over the weekend and until Dr. Rockey Situ gives advice. Advised if he gets into A-fib over the weekend with rapid heart rate that cannot be controlled or reverse on it's own, then he needs to seek ED for IV mediation or bedside cardioversion if necessary. Pt verbalized understanding, explain would try to reach out Monday or Tuesday with Dr. Donivan Scull advise, Otherwise all questions or concerns were address and no additional concerns at this time. Agreeable to plan, will call back for anything further.

## 2020-06-14 NOTE — Telephone Encounter (Signed)
Patient states he awoke this morning with high blood pressure and afib. States his BP was 148/90. Patient states Dr. Rockey Situ changed some of his medicaiton and was asked to call if any symptoms. Patient states is is not having any other symptoms. Please call to discuss

## 2020-06-16 NOTE — Telephone Encounter (Signed)
Suspect episode of atrial fibrillation unrelated to amlodipine He has had atrial fibrillation in the past testing event monitor Amlodipine held several weeks ago for low blood pressure and orthostasis symptoms, amlodipine does not control heart rate May need to go up to amiodarone 200 mg daily if he has additional episodes of atrial fibrillation Bisoprolol also helps with rate and rhythm control

## 2020-06-17 NOTE — Telephone Encounter (Signed)
Called pt back regarding his concern for intermittent A-fib, advised to stay off the amlodipine as mention by Dr. Rockey Situ at last visit, that medication was held due to low BP and orthostasis s/s. Dr. Rockey Situ recommended "May need to go up to amiodarone 200 mg daily if he has additional episodes of atrial fibrillation" and the Bisoprolol 10 mg daily he already takes will also help with rate and rhythm control. Pt verbalized understanding, will follow recommendations, advised to call clinic for an appt if intermittent a-fib persist, Otherwise all questions or concerns were address and no additional concerns at this time. Agreeable to plan, will call back for anything further.

## 2020-07-05 ENCOUNTER — Telehealth: Payer: Self-pay | Admitting: Cardiovascular Disease

## 2020-07-05 NOTE — Telephone Encounter (Signed)
Patient c/o Palpitations:  High priority if patient c/o lightheadedness, shortness of breath, or chest pain  1) How long have you had palpitations/irregular HR/ Afib? Are you having the symptoms now? A fib, not currently but had them last night  2) Are you currently experiencing lightheadedness, SOB or CP? no  3) Do you have a history of afib (atrial fibrillation) or irregular heart rhythm? yes  4) Have you checked your BP or HR? (document readings if available): 168/106  5) Are you experiencing any other symptoms? n/a

## 2020-07-05 NOTE — Telephone Encounter (Signed)
Spoke with the patient. Patient sts that he had an episode of Afib last night, unsure of the duration. He felt some tightness in his left shoulder and jaw. He denies CP, sob, palpitations, lightheadiness/dizziness.  Patient is asymptomatic this morning. His BP prior to taking his medication 168/106. I asked him to recheck his BP while I held the line. Pt reports 150/98 HR 68bpm. His BP machine reads AFIB.  Patient sent a mychart message on 06/27/20 to report increased episodes of break through AFIB.  Patient was instructed by Dr. Rockey Situ to: "May need to go up to amiodarone 200 mg daily if he has additional episodes of atrial fibrillation" and the Bisoprolol 10 mg daily he already takes will also help with rate and rhythm control.   Patient did not make the change and continued the Amiodarone 100 mg with add prn 100 mg for AFIB. He did not take the prn tab last night.  Advised the patient to increase Amiodarone to 200 mg daily as previously recommended. Continue his prescribed dose of Bisoprolol 15 mg daily.  Advised the patient that if he is having breakthrough AFIB on the higher dose of Amiodarone or if he develops symptoms he should contact the office to update Dr. Rockey Situ.  Advised the patient that I will fwd the update to Dr. Rockey Situ and we will call back if he has additional recommendations.  Patient and his wife agreeable with the plan and voiced appreciation for the call back.

## 2020-07-09 ENCOUNTER — Telehealth: Payer: Self-pay | Admitting: Pharmacist

## 2020-07-09 DIAGNOSIS — E782 Mixed hyperlipidemia: Secondary | ICD-10-CM

## 2020-07-09 MED ORDER — PRALUENT 75 MG/ML ~~LOC~~ SOAJ: 75.0000 mg | SUBCUTANEOUS | 11 refills | Status: DC

## 2020-07-09 NOTE — Telephone Encounter (Signed)
Contacted patient regarding re-opening of the [employer] patient assistance program for PCSK9-inhibitors. Patient previously received Repatha for no cost from the manufacturer, however this program was no longer available and copay cost was cost prohibitive.   Patient has recently changed to [facility] prescription insurance and PA approved for Praluent 75 mg [place] every 2 weeks. Patient also approved for [employer] patient assistance program.   Left HIPAA compliant voicemail informing patient prescription has been sent to pharmacy. Copay cost $0.

## 2020-07-10 ENCOUNTER — Ambulatory Visit (INDEPENDENT_AMBULATORY_CARE_PROVIDER_SITE_OTHER): Payer: Medicare HMO

## 2020-07-10 ENCOUNTER — Other Ambulatory Visit: Payer: Self-pay

## 2020-07-10 DIAGNOSIS — R002 Palpitations: Secondary | ICD-10-CM

## 2020-07-10 DIAGNOSIS — Z5181 Encounter for therapeutic drug level monitoring: Secondary | ICD-10-CM

## 2020-07-10 DIAGNOSIS — Z7901 Long term (current) use of anticoagulants: Secondary | ICD-10-CM | POA: Diagnosis not present

## 2020-07-10 LAB — POCT INR: INR: 2.4 (ref 2.0–3.0)

## 2020-07-10 NOTE — Patient Instructions (Signed)
-   continue warfarin dosage 1 tablet every day EXCEPT 1/2 tablet on MONDAYS, [place]. - Recheck INR in 6 weeks

## 2020-07-13 ENCOUNTER — Other Ambulatory Visit: Payer: Self-pay | Admitting: Gastroenterology

## 2020-07-13 DIAGNOSIS — K219 Gastro-esophageal reflux disease without esophagitis: Secondary | ICD-10-CM

## 2020-07-15 NOTE — Telephone Encounter (Signed)
FROM DR. BEAVERS - Needs office visit prior to additional refills. Last seen 01/2019. Thank you.

## 2020-07-15 NOTE — Telephone Encounter (Signed)
Needs office visit prior to additional refills. Last seen 01/2019. Thank you.

## 2020-07-17 ENCOUNTER — Ambulatory Visit (INDEPENDENT_AMBULATORY_CARE_PROVIDER_SITE_OTHER): Payer: Medicare HMO | Admitting: Family Medicine

## 2020-07-17 ENCOUNTER — Encounter: Payer: Self-pay | Admitting: Family Medicine

## 2020-07-17 ENCOUNTER — Other Ambulatory Visit: Payer: Self-pay

## 2020-07-17 VITALS — BP 146/82 | HR 64 | Ht 72.0 in | Wt 191.4 lb

## 2020-07-17 DIAGNOSIS — Z7689 Persons encountering health services in other specified circumstances: Secondary | ICD-10-CM | POA: Diagnosis not present

## 2020-07-17 DIAGNOSIS — M79605 Pain in left leg: Secondary | ICD-10-CM

## 2020-07-17 DIAGNOSIS — G8929 Other chronic pain: Secondary | ICD-10-CM

## 2020-07-17 DIAGNOSIS — Z8673 Personal history of transient ischemic attack (TIA), and cerebral infarction without residual deficits: Secondary | ICD-10-CM | POA: Diagnosis not present

## 2020-07-17 DIAGNOSIS — E782 Mixed hyperlipidemia: Secondary | ICD-10-CM

## 2020-07-17 DIAGNOSIS — M79604 Pain in right leg: Secondary | ICD-10-CM | POA: Diagnosis not present

## 2020-07-17 DIAGNOSIS — I10 Essential (primary) hypertension: Secondary | ICD-10-CM | POA: Diagnosis not present

## 2020-07-17 MED ORDER — AMLODIPINE BESYLATE 2.5 MG PO TABS: 2.5000 mg | ORAL_TABLET | Freq: Every day | ORAL | 3 refills | Status: DC

## 2020-07-17 NOTE — Assessment & Plan Note (Signed)
Most recent lipid panel 4 months ago 03/2020 with LDL 131. On pravastatin 80 mg and PCSK9. - check LDL at f/u visit in 1 month

## 2020-07-17 NOTE — Progress Notes (Signed)
    SUBJECTIVE:   CHIEF COMPLAINT / HPI: Establish care  Previously seen at [facility] clinic, patient transferring care because he was frustrated with care there and inability to accommodate urgent appointments.  Patient was recently hospitalized 2 months ago for RSV infection and was diagnosed with COPD at that time based on imaging.  No formal PFTs.  Patient denies shortness of breath.  Most recent HbA1c 5.5 05/2018  Social history Lives at home with wife [name] and dog, feels safe in relationship Exercise: no regular exercise, planning to do more walking Substances: former smoker (quit 2006, 1ppd for 20 years), drinks 1 glass of wine with dinner every day, denies recreational drug use Sees dentist regularly  Chronic bilateral leg pain Aching pain in bilateral legs primarily at night, occurs most night. Has been going on for the past 5 years.  Feels pain is muscular, does not feel any pain in his joints.  He does not feel that his legs are particularly restless or fidgety.  Has tried exercising. Tylenol with some relief.  HTN Checks BP at home, readings 140s-160s/90s-100s this past week. Amiodarone increased recently from 100 mg to 200 mg daily about 2 weeks ago. Recently taken off of amlodipine about 1 month ago by cardiologist because he thought that [place] was on too many medications.  PERTINENT  PMH / PSH: CAD (followed by Dr. Rockey Situ), A Fib (on warfarin followed by anticoagulation clinic), COPD, HTN, GERD, CVA (residual deficits left arm, left leg), HLD  OBJECTIVE:   BP (!) 146/82   Pulse 64   Ht 6' (1.829 m)   Wt 191 lb 6.4 oz (86.8 kg)   SpO2 97%   BMI 25.96 kg/m   General: Elderly male, NAD Eyes: PERRL, EOMI HEENT: MMM, posterior oropharynx clear, partial dentures Neck: supple, no LAD CV: RRR, no murmurs Pulm: CTAB, no wheezes or rales, good air movement Abd: soft, non-tender, +BS Neuro: 5/5 grip strength right, 4/5 grip strength left, 5/5 strength right lower  extremity, 4+/5 strength left lower extremity  ASSESSMENT/PLAN:   Essential hypertension Elevated readings at home up to 361W systolic.  Initially elevated in office 152/80 but improved to 146/82 on recheck.  We will continue current medications for now, consider restarting amlodipine if persistently elevated. - f/u 1 month  Hyperlipidemia Most recent lipid panel 4 months ago 03/2020 with LDL 131. On pravastatin 80 mg and PCSK9. - check LDL at f/u visit in 1 month  Chronic pain of both lower extremities Has been ongoing for the past 5 years, primarily at night.  Consider RLS, though symptoms do not appear consistent.  Mild, well controlled with occasional Tylenol. - Tylenol prn - ref PT - encouraged to increase exercise     Zola Button, MD [place]

## 2020-07-17 NOTE — Assessment & Plan Note (Signed)
Elevated readings at home up to 221T systolic.  Initially elevated in office 152/80 but improved to 146/82 on recheck.  We will continue current medications for now, consider restarting amlodipine if persistently elevated. - f/u 1 month

## 2020-07-17 NOTE — Patient Instructions (Addendum)
It was nice seeing you today!  I have sent a referral to physical therapy for your leg pain. You can continue taking Tylenol as needed up to 1,000 mg at a time, no more than 3,000 mg per day. I encourage you to exercise as much as you can.  Let's hold off on adding another blood pressure medication for now.  Please come see me in 1 month to re-check your blood pressure and for lab work.  Stay well, William Button, MD [place] [phone]    Hypertension, Adult Hypertension is another name for high blood pressure. High blood pressure forces your heart to work harder to pump blood. This can cause problems over time. There are two numbers in a blood pressure reading. There is a top number (systolic) over a bottom number (diastolic). It is best to have a blood pressure that is below 120/80. Healthy choices can help lower your blood pressure, or you may need medicine to help lower it. What are the causes? The cause of this condition is not known. Some conditions may be related to high blood pressure. What increases the risk?  Smoking.  Having type 2 diabetes mellitus, high cholesterol, or both.  Not getting enough exercise or physical activity.  Being overweight.  Having too much fat, sugar, calories, or salt (sodium) in your diet.  Drinking too much alcohol.  Having long-term (chronic) kidney disease.  Having a family history of high blood pressure.  Age. Risk increases with age.  Race. You may be at higher risk if you are African American.  Gender. Men are at higher risk than women before age 28. After age 26, women are at higher risk than men.  Having obstructive sleep apnea.  Stress. What are the signs or symptoms?  High blood pressure may not cause symptoms. Very high blood pressure (hypertensive crisis) may cause: ? Headache. ? Feelings of worry or nervousness (anxiety). ? Shortness of breath. ? Nosebleed. ? A feeling of being sick to  your stomach (nausea). ? Throwing up (vomiting). ? Changes in how you see. ? Very bad chest pain. ? Seizures. How is this treated?  This condition is treated by making healthy lifestyle changes, such as: ? Eating healthy foods. ? Exercising more. ? Drinking less alcohol.  Your health care provider may prescribe medicine if lifestyle changes are not enough to get your blood pressure under control, and if: ? Your top number is above 130. ? Your bottom number is above 80.  Your personal target blood pressure may vary. Follow these instructions at home: Eating and drinking  If told, follow the DASH eating plan. To follow this plan: ? Fill one half of your plate at each meal with fruits and vegetables. ? Fill one fourth of your plate at each meal with whole grains. Whole grains include whole-wheat pasta, brown rice, and whole-grain bread. ? Eat or drink low-fat dairy products, such as skim milk or low-fat yogurt. ? Fill one fourth of your plate at each meal with low-fat (lean) proteins. Low-fat proteins include fish, chicken without skin, eggs, beans, and tofu. ? Avoid fatty meat, cured and processed meat, or chicken with skin. ? Avoid pre-made or processed food.  Eat less than 1,500 mg of salt each day.  Do not drink alcohol if: ? Your doctor tells you not to drink. ? You are pregnant, may be pregnant, or are planning to become pregnant.  If you drink alcohol: ? Limit how much you use  to:  0-1 drink a day for women.  0-2 drinks a day for men. ? Be aware of how much alcohol is in your drink. In the [place] one drink equals one 12 oz bottle of beer (355 mL), one 5 oz glass of wine (148 mL), or one 1 oz glass of hard liquor (44 mL).   Lifestyle  Work with your doctor to stay at a healthy weight or to lose weight. Ask your doctor what the best weight is for you.  Get at least 30 minutes of exercise most days of the week. This may include walking, swimming, or biking.  Get at  least 30 minutes of exercise that strengthens your muscles (resistance exercise) at least 3 days a week. This may include lifting weights or doing Pilates.  Do not use any products that contain nicotine or tobacco, such as cigarettes, e-cigarettes, and chewing tobacco. If you need help quitting, ask your doctor.  Check your blood pressure at home as told by your doctor.  Keep all follow-up visits as told by your doctor. This is important.   Medicines  Take over-the-counter and prescription medicines only as told by your doctor. Follow directions carefully.  Do not skip doses of blood pressure medicine. The medicine does not work as well if you skip doses. Skipping doses also puts you at risk for problems.  Ask your doctor about side effects or reactions to medicines that you should watch for. Contact a doctor if you:  Think you are having a reaction to the medicine you are taking.  Have headaches that keep coming back (recurring).  Feel dizzy.  Have swelling in your ankles.  Have trouble with your vision. Get help right away if you:  Get a very bad headache.  Start to feel mixed up (confused).  Feel weak or numb.  Feel faint.  Have very bad pain in your: ? Chest. ? Belly (abdomen).  Throw up more than once.  Have trouble breathing. Summary  Hypertension is another name for high blood pressure.  High blood pressure forces your heart to work harder to pump blood.  For most people, a normal blood pressure is less than 120/80.  Making healthy choices can help lower blood pressure. If your blood pressure does not get lower with healthy choices, you may need to take medicine. This information is not intended to replace advice given to you by your health care provider. Make sure you discuss any questions you have with your health care provider. Document Revised: 02/02/2018 Document Reviewed: 02/02/2018 Elsevier Patient Education  2021 [employer].

## 2020-07-17 NOTE — Assessment & Plan Note (Signed)
Has been ongoing for the past 5 years, primarily at night.  Consider RLS, though symptoms do not appear consistent.  Mild, well controlled with occasional Tylenol. - Tylenol prn - ref PT - encouraged to increase exercise

## 2020-07-26 ENCOUNTER — Telehealth: Payer: Self-pay

## 2020-07-26 NOTE — Telephone Encounter (Signed)
Will get direct LDL, no need for fasting labs. Thanks.

## 2020-07-26 NOTE — Telephone Encounter (Signed)
Patient calls nurse line with questions regarding upcoming appointment on 3/11. Patient states that he is supposed to be having labs drawn at this appointment, however, he was unsure if he should be fasting.   Per last OV note, it appears that a lipid panel will be needed at next visit. Please advise if patient should come in a few days prior to appointment for AM lab visit so he can receive fasting labs.  If labs are to be fasting, please place future order.   Talbot Grumbling, RN

## 2020-07-29 NOTE — Telephone Encounter (Signed)
Called patient and informed of below. Patient verbalized understanding.  Talbot Grumbling, RN

## 2020-08-13 ENCOUNTER — Other Ambulatory Visit: Payer: Self-pay

## 2020-08-13 ENCOUNTER — Ambulatory Visit: Payer: Medicare HMO | Attending: Family Medicine | Admitting: Physical Therapy

## 2020-08-13 ENCOUNTER — Encounter: Payer: Self-pay | Admitting: Physical Therapy

## 2020-08-13 DIAGNOSIS — M79604 Pain in right leg: Secondary | ICD-10-CM | POA: Insufficient documentation

## 2020-08-13 DIAGNOSIS — R2689 Other abnormalities of gait and mobility: Secondary | ICD-10-CM | POA: Diagnosis not present

## 2020-08-13 DIAGNOSIS — M6281 Muscle weakness (generalized): Secondary | ICD-10-CM | POA: Insufficient documentation

## 2020-08-13 DIAGNOSIS — M79605 Pain in left leg: Secondary | ICD-10-CM | POA: Insufficient documentation

## 2020-08-13 NOTE — Therapy (Signed)
[place], [place], [address] Phone: [phone]   Fax:  [phone]  Physical Therapy Evaluation  Patient Details  Name: [name] MRN: [id number] Date of Birth: June 13, 1945 Referring Provider (PT): Kinnie Feil, MD   Encounter Date: 08/13/2020   PT End of Session - 08/13/20 1559    Visit Number 1    Number of Visits 8    Date for PT Re-Evaluation 10/08/20    Authorization Type [employer] MEDICARE    Authorization Time Period FOTO by 6th, KX by 15th    Progress Note Due on Visit 10    PT Start Time 1446    PT Stop Time 1531    PT Time Calculation (min) 45 min    Activity Tolerance Patient tolerated treatment well    Behavior During Therapy [facility] for tasks assessed/performed           Past Medical History:  Diagnosis Date  . BPH (benign prostatic hyperplasia)   . COPD (chronic obstructive pulmonary disease) ([place]) 2021  . Coronary artery disease    a. s/p PCI to LCx in 2007; b. MV 2017 no sig ischemia, EF 59%, nl study  . Depression   . GERD (gastroesophageal reflux disease)   . Hyperlipidemia   . Hypertension   . IBS (irritable bowel syndrome)   . Myocardial infarction ([place])    12-13 yrs ago   . PAF (paroxysmal atrial fibrillation) ([place])    a. CHADS2VASc => 5 (HTN, age x 1, stroke x 2, vascular disease); b. on Coumadin   . PNA (pneumonia)   . Skin cancer    face   . Sleep apnea    off cpap   . Stroke [facility])    a. x 2 with residual left-sided weakness    Past Surgical History:  Procedure Laterality Date  . ADENOIDECTOMY  1951  . CARDIAC CATHETERIZATION  2006   X1 STENT  . COLONOSCOPY    . CORONARY ANGIOPLASTY    . POLYPECTOMY    . RIGHT/LEFT HEART CATH AND CORONARY ANGIOGRAPHY N/A 01/25/2020   Procedure: RIGHT/LEFT HEART CATH AND CORONARY ANGIOGRAPHY;  Surgeon: Minna Merritts, MD;  Location: [place] [facility];  Service: Cardiovascular;  Laterality: N/A;  . SKIN CANCER  EXCISION N/A 08/2016   [place].  [facility] (Dr. Deliah Boston)  . THYROIDECTOMY, PARTIAL    . UPPER GASTROINTESTINAL ENDOSCOPY    . VASCULAR SURGERY     varicose vein stripping left leg    There were no vitals filed for this visit.    Subjective Assessment - 08/13/20 1449    Subjective Patient reports leg pain at night when he lays down for more than 45 minutes that keeps him up at night, between restless leg syndrome and just pain. This has been going on for months with no apparent mechanism, gradually came on. Leg pain is equal bilaterally and starts around his thighs and goes the whole way down his legs. Sometimes when he gets up and walks around it helps, he also tries some stretches and squats. Patient reports he has not been as active over the past year, he has been starting to walk more which has helped some. Patient states he is a lot weaker than he used to be, but the pain not limit him during the day. Patient also notes right sided lower back pain for years, which is aggravated when he stands for extended periods, and he had a stroke years  ago which has affected his left side. He reports he has regained about 80% of function on the left side. Patient also notes he has been using a cane the past year due to balance and weakness.    Limitations Other (comment);Walking   Sleeping   How long can you walk comfortably? 10-15 minutes    Diagnostic tests None    Patient Stated Goals Get legs feeling better so he can sleep better    Currently in Pain? Yes    Pain Score 0-No pain   "did not rate, states it is annoying and keeps him awake"   Pain Location Leg    Pain Orientation Right;Left    Pain Descriptors / Indicators Aching    Pain Type Chronic pain    Pain Onset More than a month ago    Pain Frequency Intermittent    Aggravating Factors  Lying down for > 45 minutes (just at night)    Pain Relieving Factors Tylenol              [facility] PT Assessment - 08/13/20 0001       Assessment   Medical Diagnosis Chronic pain of both lower extremities    Referring Provider (PT) Kinnie Feil, MD    Onset Date/Surgical Date --   approximately 1 year   Hand Dominance Right    Next MD Visit 08/16/2020      Precautions   Precautions Fall      Restrictions   Weight Bearing Restrictions No      Balance Screen   Has the patient fallen in the past 6 months Yes    How many times? 2-3, lost balance because weakness in legs    Has the patient had a decrease in activity level because of a fear of falling?  Yes    Is the patient reluctant to leave their home because of a fear of falling?  No      Home Environment   Living Environment Private residence    Living Arrangements Spouse/significant other    Type of [place] Access Level entry    [place] Two level    Alternate Level Stairs-Number of Steps L shaped flight    Alternate Level Stairs-Rails Right   going up   Additional Comments Patient reports he goes upstairs 1x/day      Prior Function   Level of Independence Independent    Vocation Retired    Leisure Management consultant   Overall Cognitive Status Within Functional Limits for tasks assessed      Observation/Other Assessments   Observations Patient appears in no apparent distress    Focus on Therapeutic Outcomes (FOTO)  42% functional status      Sensation   Light Touch Appears Intact      Coordination   Gross Motor Movements are Fluid and Coordinated Yes      Functional Tests   Functional tests Sit to Stand      Sit to Stand   Comments Patient required to use BUE, cued for forward trunk lean      ROM / Strength   AROM / PROM / Strength AROM;PROM;Strength      AROM   Overall AROM Comments Knee AROM grossly WFL      PROM   Overall PROM Comments Hip PROM grossly [facility]      Strength   Strength Assessment Site Hip;Knee;Ankle    Right/Left Hip Right;Left  Right Hip Flexion 4-/5    Right Hip ABduction 4-/5     Left Hip Flexion 4-/5    Left Hip ABduction 3-/5    Right/Left Knee Right;Left    Right Knee Flexion 4+/5    Right Knee Extension 4+/5    Left Knee Flexion 4/5    Left Knee Extension 4+/5    Right/Left Ankle Right;Left    Right Ankle Dorsiflexion 5/5    Right Ankle Plantar Flexion 4/5    Left Ankle Dorsiflexion 4+/5    Left Ankle Plantar Flexion 4-/5      Flexibility   Soft Tissue Assessment /Muscle Length yes    Hamstrings Limited bilaterally ~ 35 deg    Quadriceps Limited bilaterally   hip flexor + quad     Palpation   Spinal mobility Not assessed    Palpation comment Non-TTP      Special Tests   Other special tests None performed      Ambulation/Gait   Ambulation/Gait Yes    Ambulation/Gait Assistance 6: Modified independent (Device/Increase time)    Assistive device Straight cane    Gait Comments Patient ambulates with crouched knee position, wide BOS, unsteady gait                      Objective measurements completed on examination: See above findings.       [facility] Adult PT Treatment/Exercise - 08/13/20 0001      Exercises   Exercises Knee/Hip      Knee/Hip Exercises: Stretches   Passive Hamstring Stretch 2 reps;30 seconds    Passive Hamstring Stretch Limitations seated edge of chair    Quad Stretch 2 reps;30 seconds    Quad Stretch Limitations thomas stretch edge of mat    Gastroc Stretch 2 reps;30 seconds    Gastroc Stretch Limitations standing at counter      Knee/Hip Exercises: Standing   Hip Flexion 2 sets;10 reps    Hip Flexion Limitations alternating marching at counter    Hip Abduction 2 sets;10 reps    Abduction Limitations at counter      Knee/Hip Exercises: Seated   Sit to [employer] 2 sets;5 reps   hands on thighs for support                 PT Education - 08/13/20 1537    Education Details Exam findings, POC, HEP, possible etiology of symptoms and progression of physical activity    Person(s) Educated Patient    Methods  Explanation;Demonstration;Tactile cues;Verbal cues;Handout    Comprehension Verbalized understanding;Returned demonstration;Verbal cues required;Tactile cues required;Need further instruction            PT Short Term Goals - 08/13/20 1629      PT SHORT TERM GOAL #1   Title Patient will be I with initial HEP to progress with PT    Time 4    Period Weeks    Status New    Target Date 09/10/20      PT SHORT TERM GOAL #2   Title Patient will be able to perform sit<>stand x 5 reps without UE assist from standard chair to indicate improved strength    Time 4    Period Weeks    Status New    Target Date 09/10/20      PT SHORT TERM GOAL #3   Title PT will review FOTO with patient by 3rd visit    Time 3    Period Weeks  Status New    Target Date 09/03/20      PT SHORT TERM GOAL #4   Title Patient will report 25% improved in pain at night to improve sleeping ability    Time 4    Period Weeks    Status New    Target Date 09/10/20             PT Long Term Goals - 08/13/20 1611      PT LONG TERM GOAL #1   Title Patient will be I with final HEP to maintain progress with PT    Time 8    Period Weeks    Status New    Target Date 10/08/20      PT LONG TERM GOAL #2   Title Patient will report improved functional status >/= 66% functional status on FOTO    Time 8    Period Weeks    Status New    Target Date 10/08/20      PT LONG TERM GOAL #3   Title Patient will exhibit improved hip strength >/= 4/5 MMT and knee strength >/= 5/5 MMT to improve walking tolerance    Time 8    Period Weeks    Status New    Target Date 10/08/20      PT LONG TERM GOAL #4   Title Patient will be able to perform heavy activities around his home without minimal to no difficulty    Time 8    Period Weeks    Status New    Target Date 10/08/20      PT LONG TERM GOAL #5   Title Patient will report 50% improvement in leg pain at night to improve sleeping ability    Time 8    Period Weeks     Status New    Target Date 10/08/20                  Plan - 08/13/20 1634    Clinical Impression Statement Patient presents to PT with report of chronic bilateral leg pain at night when he lies down for > 45 minutes that affects his sleeping ability. He also is experiencing lower extremity weakness from inactivity that affect affects his walking and with recent falls. Patient does exhibit bilateral weakness that is worse on left secondary to previous stroke, he also demonstrated flexibility deficit of hamstrings, quads, and calves affecting his overall mobility. It is unclear etiology of leg pain at night, but recent inactivity, weakness, and flexibility deficits could be contributing. Patient provided exercises to initiate stretching and strengthening program, and he would benefit from continued skilled PT to improve his strength and mobility in order to reduce pain and maximize functional level.    Personal Factors and Comorbidities Age;Fitness;Past/Current Experience;Time since onset of injury/illness/exacerbation;Comorbidity 3+    Comorbidities Hx of CVA, A fib, HTN, chronic back pain, depression and anxiety    Examination-Activity Limitations Locomotion Level;Lift;Sleep    Examination-Participation Restrictions Community Activity;Shop;Yard Work;Meal Prep    Stability/Clinical Decision Making Evolving/Moderate complexity    Clinical Decision Making Moderate    Rehab Potential Fair    PT Frequency 1x / week    PT Duration 8 weeks    PT Treatment/Interventions ADLs/Self Care Home Management;Aquatic Therapy;Cryotherapy;Electrical Stimulation;Iontophoresis 4mg /ml Dexamethasone;Moist Heat;Neuromuscular re-education;Balance training;Therapeutic exercise;Therapeutic activities;Functional mobility training;Stair training;Gait training;Patient/family education;Manual techniques;Dry needling;Passive range of motion;Taping;Joint Manipulations    PT Next Visit Plan Review HEP and progress PRN,  formal balance or endurance assessment (TUG, BERG, 2  or 6 MWT), stretching for hamstring and quads, progress hip and general LE strengthening, initiate balance training    PT Home Exercise Plan [phone]    Consulted and Agree with Plan of Care Patient           Patient will benefit from skilled therapeutic intervention in order to improve the following deficits and impairments:  Abnormal gait,Impaired flexibility,Difficulty walking,Decreased strength,Decreased balance,Pain,Decreased activity tolerance  Visit Diagnosis: Muscle weakness (generalized)  Pain in left leg  Pain in right leg  Other abnormalities of gait and mobility     Problem List Patient Active Problem List   Diagnosis Date Noted  . Chronic pain of both lower extremities 07/17/2020  . COPD exacerbation ([place]) 05/08/2020  . Unstable angina ([place]) 01/25/2020  . Statin myopathy 06/07/2019  . Dysuria 04/21/2019  . Right elbow pain 12/21/2018  . Left-sided weakness 08/12/2018  . Chronic obstructive pulmonary disease ([place]) 06/13/2018  . Abdominal pain 05/27/2018  . Wrist pain, acute, right 11/05/2017  . Sleep apnea 09/16/2017  . Fall 07/21/2017  . Constipation 07/21/2017  . Chronic fatigue 06/18/2017  . Tremor 06/18/2017  . Eczema 06/18/2017  . Testicular atrophy 04/23/2017  . Dyspnea on exertion 03/16/2017  . Erectile dysfunction 03/05/2017  . Palpitations 12/09/2015  . Anxiety and depression 02/08/2015  . GERD (gastroesophageal reflux disease) 02/08/2015  . IBS (irritable bowel syndrome) 02/08/2015  . Essential hypertension 02/08/2015  . AF (paroxysmal atrial fibrillation) ([place]) 01/23/2015  . CAD (coronary artery disease) 01/23/2015  . Hyperlipidemia 01/23/2015  . History of CVA (cerebrovascular accident) 01/23/2015    Hilda Blades, PT, DPT, LAT, ATC 08/13/20  5:16 PM Phone: [phone] Fax: [facility] [address] [place], [place], [address] Phone: [phone]   Fax:  [phone]  Name: [name] MRN: [id number] Date of Birth: 1946-02-20

## 2020-08-13 NOTE — Patient Instructions (Signed)
Access Code: [email or web address] URL: [email or web address] Date: 08/13/2020 Prepared by: Hilda Blades  Exercises Modified Marcello Moores Stretch - 2 x daily - 7 x weekly - 2 reps - 30 seconds hold Seated Hamstring Stretch - 2 x daily - 7 x weekly - 2 reps - 30 seconds hold Sit to Stand - 1 x daily - 7 x weekly - 3 sets - 5 reps Standing Hip Abduction with Counter Support - 1 x daily - 7 x weekly - 2 sets - 10 reps Standing March with Counter Support - 1 x daily - 7 x weekly - 2 sets - 10 reps Standing Gastroc Stretch at Counter - 2 x daily - 7 x weekly - 2 reps - 30 seconds hold

## 2020-08-14 ENCOUNTER — Other Ambulatory Visit: Payer: Self-pay | Admitting: Family Medicine

## 2020-08-14 DIAGNOSIS — I10 Essential (primary) hypertension: Secondary | ICD-10-CM

## 2020-08-15 NOTE — Progress Notes (Signed)
    SUBJECTIVE:   CHIEF COMPLAINT / HPI:   Here with wife [name].  HTN Medications: bisoprolol 15 mg daily, lisinopril 40 mg daily, furosemide 20 mg daily. He has brought today his BP log, most blood pressures 144R to 154M systolic and 08Q diastolic. He was previously on amlodipine, but this was discontinued by his cardiologist because he was on so many medications.  HLD On pravastatin 80 mg and alirocumab injections every 14 days. Patient states he stopped taking his pravastatin recently as he was having more leg pain with it.  He is wondering if he can stop taking this medication.  Chronic bilateral leg pain Has been doing the exercises which seems to be helping with the pain. However, still having pain at night described as shooting pain. Only has pain when laying flat at night. Feels legs are restless at night and keeps him awake due to pain. Heating pad sometimes helps a little bit.  Right hand tremor He has noticed a tremor in his right hand for about a year which has been worsening. Notices this more with tasks such as shaving or trimming nails. Has not noticed this in his left hand though he typically does not use his left hand due to residual weakess from prior CVA. Sometimes has a half a glass of wine in the evening sometimes. This does not appear to have any relation to the tremor. Used to see neurology.  PERTINENT  PMH / PSH: CAD (followed by Dr. Rockey Situ), A Fib (on warfarin followed by anticoagulation clinic), COPD, HTN, GERD, CVA (residual deficits left arm, left leg), HLD  OBJECTIVE:   BP (!) 158/98   Pulse 63   Wt 189 lb 9.6 oz (86 kg)   SpO2 97%   BMI 25.71 kg/m   General: Elderly male, NAD CV: RRR, no murmurs Pulm: CTAB, no wheezes or rales Neuro: 5/5 grip strength right, 4/5 grip strength left, no rest tremor noted, faint tremor noted when arms outstretched more prominent on left, normal finger-to-nose  ASSESSMENT/PLAN:   Essential hypertension Elevated in  office and with home readings.  Will restart low-dose amlodipine at 2.5 mg.  Follow-up in 1 month and will uptitrate if needed.  Hyperlipidemia On pravastatin and PCSK9.  He has a documented history of statin intolerance and has stopped taking his pravastatin due to myalgias.  Will recheck LDL today.  Ideally, patient should continue on the pravastatin given his history of CAD and CVA.  LDL goal less than 70.  Can consider spacing out to 3 times a week due to statin intolerance.  Tremor Reports right hand tremor has been worsening lately.  On exam, it appears he has bilateral hand tremors slightly worse on the left.  Tremor appears to be positional or intentional in nature, suspect essential tremor.  Features less consistent with parkinsonian tremor.  Will check TSH to evaluate for hyperthyroidism as a potential cause.  Will consider referral back to neurology if needed.  Chronic pain of both lower extremities Based on history, he may have some underlying restless leg syndrome.  Could consider PAD given his history, though pain does not appear to be exacerbated by exertion and is also worse at rest at night.  Will obtain labs to evaluate for underlying causes (iron deficiency, vitamin B12 deficiency related neuropathy, thyroid dysfunction). - TSH - B12 - CBC - ferritin   F/u 1 month  Zola Button, MD [place]

## 2020-08-16 ENCOUNTER — Ambulatory Visit (INDEPENDENT_AMBULATORY_CARE_PROVIDER_SITE_OTHER): Payer: Medicare HMO | Admitting: Family Medicine

## 2020-08-16 ENCOUNTER — Other Ambulatory Visit: Payer: Self-pay

## 2020-08-16 ENCOUNTER — Encounter: Payer: Self-pay | Admitting: Family Medicine

## 2020-08-16 VITALS — BP 158/98 | HR 63 | Wt 189.6 lb

## 2020-08-16 DIAGNOSIS — M79604 Pain in right leg: Secondary | ICD-10-CM

## 2020-08-16 DIAGNOSIS — R251 Tremor, unspecified: Secondary | ICD-10-CM

## 2020-08-16 DIAGNOSIS — I10 Essential (primary) hypertension: Secondary | ICD-10-CM

## 2020-08-16 DIAGNOSIS — E782 Mixed hyperlipidemia: Secondary | ICD-10-CM | POA: Diagnosis not present

## 2020-08-16 DIAGNOSIS — E785 Hyperlipidemia, unspecified: Secondary | ICD-10-CM | POA: Diagnosis not present

## 2020-08-16 DIAGNOSIS — G609 Hereditary and idiopathic neuropathy, unspecified: Secondary | ICD-10-CM | POA: Diagnosis not present

## 2020-08-16 DIAGNOSIS — G2581 Restless legs syndrome: Secondary | ICD-10-CM

## 2020-08-16 DIAGNOSIS — G8929 Other chronic pain: Secondary | ICD-10-CM | POA: Diagnosis not present

## 2020-08-16 DIAGNOSIS — M79605 Pain in left leg: Secondary | ICD-10-CM | POA: Diagnosis not present

## 2020-08-16 DIAGNOSIS — R5382 Chronic fatigue, unspecified: Secondary | ICD-10-CM | POA: Diagnosis not present

## 2020-08-16 MED ORDER — AMLODIPINE BESYLATE 2.5 MG PO TABS: 2.5000 mg | ORAL_TABLET | Freq: Every day | ORAL | 3 refills | Status: DC

## 2020-08-16 NOTE — Patient Instructions (Addendum)
It was nice seeing you today!  Start amlodipine 2.5 mg daily.  For tremor and leg pain, I am getting some blood work.  Follow-up in 1 month to discuss results.  Please arrive at least 15 minutes prior to your scheduled appointments.  Stay well, Zola Button, MD [place] [phone]

## 2020-08-17 LAB — CBC
Hematocrit: 43.6 % (ref 37.5–51.0)
Hemoglobin: 14.3 g/dL (ref 13.0–17.7)
MCH: 30.7 pg (ref 26.6–33.0)
MCHC: 32.8 g/dL (ref 31.5–35.7)
MCV: 94 fL (ref 79–97)
Platelets: 254 10*3/uL (ref 150–450)
RBC: 4.66 x10E6/uL (ref 4.14–5.80)
RDW: 13.2 % (ref 11.6–15.4)
WBC: 6 10*3/uL (ref 3.4–10.8)

## 2020-08-17 LAB — VITAMIN B12: Vitamin B-12: 872 pg/mL (ref 232–1245)

## 2020-08-17 LAB — FERRITIN: Ferritin: 71 ng/mL (ref 30–400)

## 2020-08-17 LAB — LDL CHOLESTEROL, DIRECT: LDL Direct: 85 mg/dL (ref 0–99)

## 2020-08-17 LAB — TSH: TSH: 2.08 u[IU]/mL (ref 0.450–4.500)

## 2020-08-17 NOTE — Assessment & Plan Note (Signed)
Based on history, he may have some underlying restless leg syndrome.  Could consider PAD given his history, though pain does not appear to be exacerbated by exertion and is also worse at rest at night.  Will obtain labs to evaluate for underlying causes (iron deficiency, vitamin B12 deficiency related neuropathy, thyroid dysfunction). - TSH - B12 - CBC - ferritin

## 2020-08-17 NOTE — Assessment & Plan Note (Signed)
Elevated in office and with home readings.  Will restart low-dose amlodipine at 2.5 mg.  Follow-up in 1 month and will uptitrate if needed.

## 2020-08-17 NOTE — Assessment & Plan Note (Signed)
Reports right hand tremor has been worsening lately.  On exam, it appears he has bilateral hand tremors slightly worse on the left.  Tremor appears to be positional or intentional in nature, suspect essential tremor.  Features less consistent with parkinsonian tremor.  Will check TSH to evaluate for hyperthyroidism as a potential cause.  Will consider referral back to neurology if needed.

## 2020-08-17 NOTE — Assessment & Plan Note (Signed)
On pravastatin and PCSK9.  He has a documented history of statin intolerance and has stopped taking his pravastatin due to myalgias.  Will recheck LDL today.  Ideally, patient should continue on the pravastatin given his history of CAD and CVA.  LDL goal less than 70.  Can consider spacing out to 3 times a week due to statin intolerance.

## 2020-08-21 ENCOUNTER — Encounter (HOSPITAL_COMMUNITY): Payer: Self-pay | Admitting: Emergency Medicine

## 2020-08-21 ENCOUNTER — Other Ambulatory Visit: Payer: Self-pay

## 2020-08-21 ENCOUNTER — Emergency Department (HOSPITAL_COMMUNITY): Payer: Medicare HMO

## 2020-08-21 ENCOUNTER — Telehealth: Payer: Self-pay

## 2020-08-21 ENCOUNTER — Emergency Department (HOSPITAL_COMMUNITY)
Admission: EM | Admit: 2020-08-21 | Discharge: 2020-08-21 | Disposition: A | Discharge status: Home / Self Care | Payer: Medicare HMO | Attending: Emergency Medicine | Admitting: Emergency Medicine

## 2020-08-21 DIAGNOSIS — Z7982 Long term (current) use of aspirin: Secondary | ICD-10-CM | POA: Insufficient documentation

## 2020-08-21 DIAGNOSIS — R42 Dizziness and giddiness: Secondary | ICD-10-CM | POA: Insufficient documentation

## 2020-08-21 DIAGNOSIS — I2510 Atherosclerotic heart disease of native coronary artery without angina pectoris: Secondary | ICD-10-CM | POA: Insufficient documentation

## 2020-08-21 DIAGNOSIS — Z7901 Long term (current) use of anticoagulants: Secondary | ICD-10-CM | POA: Insufficient documentation

## 2020-08-21 DIAGNOSIS — R531 Weakness: Secondary | ICD-10-CM | POA: Insufficient documentation

## 2020-08-21 DIAGNOSIS — I10 Essential (primary) hypertension: Secondary | ICD-10-CM | POA: Diagnosis not present

## 2020-08-21 DIAGNOSIS — R110 Nausea: Secondary | ICD-10-CM | POA: Diagnosis not present

## 2020-08-21 DIAGNOSIS — Z7952 Long term (current) use of systemic steroids: Secondary | ICD-10-CM | POA: Diagnosis not present

## 2020-08-21 DIAGNOSIS — Z85828 Personal history of other malignant neoplasm of skin: Secondary | ICD-10-CM | POA: Diagnosis not present

## 2020-08-21 DIAGNOSIS — I672 Cerebral atherosclerosis: Secondary | ICD-10-CM | POA: Diagnosis not present

## 2020-08-21 DIAGNOSIS — Z79899 Other long term (current) drug therapy: Secondary | ICD-10-CM | POA: Diagnosis not present

## 2020-08-21 DIAGNOSIS — I639 Cerebral infarction, unspecified: Secondary | ICD-10-CM | POA: Diagnosis not present

## 2020-08-21 DIAGNOSIS — Z87891 Personal history of nicotine dependence: Secondary | ICD-10-CM | POA: Diagnosis not present

## 2020-08-21 DIAGNOSIS — Z8669 Personal history of other diseases of the nervous system and sense organs: Secondary | ICD-10-CM | POA: Diagnosis not present

## 2020-08-21 DIAGNOSIS — I6782 Cerebral ischemia: Secondary | ICD-10-CM | POA: Diagnosis not present

## 2020-08-21 DIAGNOSIS — I616 Nontraumatic intracerebral hemorrhage, multiple localized: Secondary | ICD-10-CM | POA: Diagnosis not present

## 2020-08-21 DIAGNOSIS — J449 Chronic obstructive pulmonary disease, unspecified: Secondary | ICD-10-CM | POA: Diagnosis not present

## 2020-08-21 DIAGNOSIS — M4802 Spinal stenosis, cervical region: Secondary | ICD-10-CM | POA: Diagnosis not present

## 2020-08-21 LAB — DIFFERENTIAL
Abs Immature Granulocytes: 0.02 10*3/uL (ref 0.00–0.07)
Basophils Absolute: 0 10*3/uL (ref 0.0–0.1)
Basophils Relative: 1 %
Eosinophils Absolute: 0.2 10*3/uL (ref 0.0–0.5)
Eosinophils Relative: 3 %
Immature Granulocytes: 0 %
Lymphocytes Relative: 30 %
Lymphs Abs: 1.7 10*3/uL (ref 0.7–4.0)
Monocytes Absolute: 0.5 10*3/uL (ref 0.1–1.0)
Monocytes Relative: 9 %
Neutro Abs: 3.2 10*3/uL (ref 1.7–7.7)
Neutrophils Relative %: 57 %

## 2020-08-21 LAB — PROTIME-INR
INR: 2.5 — ABNORMAL HIGH (ref 0.8–1.2)
Prothrombin Time: 26.3 seconds — ABNORMAL HIGH (ref 11.4–15.2)

## 2020-08-21 LAB — COMPREHENSIVE METABOLIC PANEL
ALT: 20 U/L (ref 0–44)
AST: 25 U/L (ref 15–41)
Albumin: 3.7 g/dL (ref 3.5–5.0)
Alkaline Phosphatase: 66 U/L (ref 38–126)
Anion gap: 7 (ref 5–15)
BUN: 9 mg/dL (ref 8–23)
CO2: 27 mmol/L (ref 22–32)
Calcium: 8.8 mg/dL — ABNORMAL LOW (ref 8.9–10.3)
Chloride: 106 mmol/L (ref 98–111)
Creatinine, Ser: 0.93 mg/dL (ref 0.61–1.24)
GFR, Estimated: >60 mL/min (ref >60)
Glucose, Bld: 121 mg/dL — ABNORMAL HIGH (ref 70–99)
Potassium: 3.7 mmol/L (ref 3.5–5.1)
Sodium: 140 mmol/L (ref 135–145)
Total Bilirubin: 0.7 mg/dL (ref 0.3–1.2)
Total Protein: 6.9 g/dL (ref 6.5–8.1)

## 2020-08-21 LAB — CBC
HCT: 42.5 % (ref 39.0–52.0)
Hemoglobin: 14 g/dL (ref 13.0–17.0)
MCH: 31.1 pg (ref 26.0–34.0)
MCHC: 32.9 g/dL (ref 30.0–36.0)
MCV: 94.4 fL (ref 80.0–100.0)
Platelets: 208 10*3/uL (ref 150–400)
RBC: 4.5 MIL/uL (ref 4.22–5.81)
RDW: 13.6 % (ref 11.5–15.5)
WBC: 5.6 10*3/uL (ref 4.0–10.5)
nRBC: 0 % (ref 0.0–0.2)

## 2020-08-21 LAB — I-STAT CHEM 8, ED
BUN: 11 mg/dL (ref 8–23)
Calcium, Ion: 1.17 mmol/L (ref 1.15–1.40)
Chloride: 103 mmol/L (ref 98–111)
Creatinine, Ser: 0.8 mg/dL (ref 0.61–1.24)
Glucose, Bld: 117 mg/dL — ABNORMAL HIGH (ref 70–99)
HCT: 43 % (ref 39.0–52.0)
Hemoglobin: 14.6 g/dL (ref 13.0–17.0)
Potassium: 3.7 mmol/L (ref 3.5–5.1)
Sodium: 143 mmol/L (ref 135–145)
TCO2: 29 mmol/L (ref 22–32)

## 2020-08-21 LAB — APTT: aPTT: 42 seconds — ABNORMAL HIGH (ref 24–36)

## 2020-08-21 MED ORDER — MECLIZINE HCL 25 MG PO TABS: 25.0000 mg | ORAL_TABLET | Freq: Three times a day (TID) | ORAL | 0 refills | Status: DC | PRN

## 2020-08-21 MED ORDER — SODIUM CHLORIDE 0.9% FLUSH
3.0000 mL | Freq: Once | INTRAVENOUS | Status: DC
Start: 2020-08-21 — End: 2020-08-21

## 2020-08-21 MED ORDER — MECLIZINE HCL 25 MG PO TABS
50.0000 mg | ORAL_TABLET | Freq: Once | ORAL | Status: AC
  Administered 2020-08-21: 50 mg via ORAL
  Filled 2020-08-21: qty 2

## 2020-08-21 MED ORDER — IOHEXOL 350 MG/ML SOLN
75.0000 mL | Freq: Once | INTRAVENOUS | Status: AC | PRN
  Administered 2020-08-21: 75 mL via INTRAVENOUS

## 2020-08-21 MED ORDER — GADOBUTROL 1 MMOL/ML IV SOLN
8.0000 mL | Freq: Once | INTRAVENOUS | Status: AC | PRN
  Administered 2020-08-21: 8 mL via INTRAVENOUS

## 2020-08-21 NOTE — ED Provider Notes (Signed)
[facility] EMERGENCY DEPARTMENT Provider Note   CSN: [id number] Arrival date & time: 08/21/20  1058     History Chief Complaint  Patient presents with  . Dizziness    [name] is a 75 y.o. male who presents emergency department for disequilibrium.  He has a past medical history of COPD, CAD status post PCI, hyperlipidemia, hypertension, paroxysmal atrial fibrillation anticoagulated on Coumadin, and previous CVA with left-sided weakness.  The patient awoke this morning with will ataxia and imbalance.  This is new.  He states that he was show off balance and felt dizzy but without sensation of vertigo that he almost fell twice and had to hold onto things in his room to get around. He had associated diaphoresis and nausea. He denies tinnitus. His sensation does not change depending on movement.  He is feeling improved from this morning when he awoke.  He and his wife report that back in the spring of last year he suddenly he began having to use a cane to walk.  He has had longstanding low back pain which is moderate along with sensation of burning in his legs especially at night and akathisia.  He and his wife have noticed that his weakness has gotten progressively worse in his lower extremities.  His wife states that she now has to actually help him to sit up in bed.  He has a new tremor especially in his right arm.  This has been an ongoing issue for almost 1 year and has not previously been evaluated.  He has had a occipital headache for the past few days.  He denies changes in vision, he has had intermittent episodes of urinary incontinence which he blames on a diuretic.  He denies saddle anesthesia, bowel incontinence.  He denies fevers, chills, chest pain or shortness of breath.  HPI     Past Medical History:  Diagnosis Date  . BPH (benign prostatic hyperplasia)   . COPD (chronic obstructive pulmonary disease) ([place]) 2021  . Coronary artery disease    a. s/p PCI  to LCx in 2007; b. MV 2017 no sig ischemia, EF 59%, nl study  . Depression   . GERD (gastroesophageal reflux disease)   . Hyperlipidemia   . Hypertension   . IBS (irritable bowel syndrome)   . Myocardial infarction ([place])    12-13 yrs ago   . PAF (paroxysmal atrial fibrillation) ([place])    a. CHADS2VASc => 5 (HTN, age x 1, stroke x 2, vascular disease); b. on Coumadin   . PNA (pneumonia)   . Skin cancer    face   . Sleep apnea    off cpap   . Stroke [facility])    a. x 2 with residual left-sided weakness    Patient Active Problem List   Diagnosis Date Noted  . Chronic pain of both lower extremities 07/17/2020  . COPD exacerbation ([place]) 05/08/2020  . Unstable angina ([place]) 01/25/2020  . Statin myopathy 06/07/2019  . Dysuria 04/21/2019  . Right elbow pain 12/21/2018  . Left-sided weakness 08/12/2018  . Chronic obstructive pulmonary disease ([place]) 06/13/2018  . Abdominal pain 05/27/2018  . Wrist pain, acute, right 11/05/2017  . Sleep apnea 09/16/2017  . Fall 07/21/2017  . Constipation 07/21/2017  . Chronic fatigue 06/18/2017  . Tremor 06/18/2017  . Eczema 06/18/2017  . Testicular atrophy 04/23/2017  . Dyspnea on exertion 03/16/2017  . Erectile dysfunction 03/05/2017  . Palpitations 12/09/2015  . Anxiety and depression 02/08/2015  . GERD (  gastroesophageal reflux disease) 02/08/2015  . IBS (irritable bowel syndrome) 02/08/2015  . Essential hypertension 02/08/2015  . AF (paroxysmal atrial fibrillation) ([place]) 01/23/2015  . CAD (coronary artery disease) 01/23/2015  . Hyperlipidemia 01/23/2015  . History of CVA (cerebrovascular accident) 01/23/2015    Past Surgical History:  Procedure Laterality Date  . ADENOIDECTOMY  1951  . CARDIAC CATHETERIZATION  2006   X1 STENT  . COLONOSCOPY    . CORONARY ANGIOPLASTY    . POLYPECTOMY    . RIGHT/LEFT HEART CATH AND CORONARY ANGIOGRAPHY N/A 01/25/2020   Procedure: RIGHT/LEFT HEART CATH AND CORONARY ANGIOGRAPHY;  Surgeon: Minna Merritts,  MD;  Location: [facility];  Service: Cardiovascular;  Laterality: N/A;  . SKIN CANCER EXCISION N/A 08/2016   [place].  [facility] (Dr. Deliah Boston)  . THYROIDECTOMY, PARTIAL    . UPPER GASTROINTESTINAL ENDOSCOPY    . VASCULAR SURGERY     varicose vein stripping left leg       Family History  Problem Relation Age of Onset  . Heart Problems Father   . Heart disease Father   . Hypertension Father   . Diabetes Father   . Mental illness Mother   . Arthritis Mother   . Cancer Mother        ovarian  . Stroke Paternal Uncle   . Heart disease Paternal Grandfather   . Cancer Maternal Aunt   . Stroke Maternal Grandmother   . Sudden death Maternal Grandfather   . Heart disease Paternal Grandmother   . Colon cancer Neg Hx   . Esophageal cancer Neg Hx   . Rectal cancer Neg Hx   . Colon polyps Neg Hx   . Stomach cancer Neg Hx     Social History   Tobacco Use  . Smoking status: Former Smoker    Years: 20.00    Types: Cigarettes  . Smokeless tobacco: Never Used  Vaping Use  . Vaping Use: Never used  Substance Use Topics  . Alcohol use: Yes    Alcohol/week: 2.0 - 3.0 standard drinks    Types: 2 - 3 Glasses of wine per week    Comment: Wine daily with dinner most days  . Drug use: No    Home Medications Prior to Admission medications   Medication Sig Start Date End Date Taking? Authorizing Provider  acetaminophen (TYLENOL) 500 MG tablet Take 1,000 mg by mouth every 6 (six) hours as needed for mild pain.    [provider]  albuterol (VENTOLIN HFA) 108 (90 Base) MCG/ACT inhaler Inhale 2 puffs into the lungs every 6 (six) hours as needed for wheezing or shortness of breath. 05/12/20   Dessa Phi, DO  Alirocumab (PRALUENT) 75 MG/ML SOAJ Inject 75 mg into the skin every 14 (fourteen) days. 07/09/20   Minna Merritts, MD  amiodarone (PACERONE) 100 MG tablet Take 1 tablet (100 mg total) by mouth daily. Patient taking differently: Take 200 mg by  mouth daily. 01/29/20   Minna Merritts, MD  amLODipine (NORVASC) 2.5 MG tablet Take 1 tablet (2.5 mg total) by mouth at bedtime. 08/16/20   Zola Button, MD  aspirin 81 MG tablet Take 81 mg by mouth at bedtime.     [provider]  bisoprolol (ZEBETA) 10 MG tablet Take 1.5 tablets (15 mg total) by mouth daily. 08/14/20   Zola Button, MD  Cholecalciferol (VITAMIN D3) 50 MCG (2000 UT) TABS Take 2,000 Units by mouth at bedtime.     [provider]  DULoxetine (CYMBALTA) 20 MG capsule Take 40 mg by mouth daily.    [provider]  ezetimibe (ZETIA) 10 MG tablet Take 1 tablet (10 mg total) by mouth daily. Patient not taking: Reported on 07/17/2020 04/24/20 07/23/20  Loel Dubonnet, NP  fluticasone [facility]) 50 MCG/ACT nasal spray Place into both nostrils. 05/20/20   [provider]  furosemide (LASIX) 20 MG tablet Take 20 mg by mouth daily as needed for fluid or edema.     [provider]  hydrocortisone 2.5 % cream Apply topically daily as needed. 01/02/20   [provider]  KLOR-CON M10 10 MEQ tablet Take 10 mEq by mouth every other day. 04/16/20   [provider]  lisinopril (ZESTRIL) 40 MG tablet TAKE 1 TABLET (40 MG TOTAL) BY MOUTH DAILY. FOR BLOOD PRESSURE. 09/25/19   Pleas Koch, NP  mirtazapine (REMERON) 15 MG tablet Take 15 mg by mouth at bedtime.    [provider]  Multiple Vitamins-Minerals (MULTIVITAMIN WITH MINERALS) tablet Take 1 tablet by mouth daily. Centrum Silver    [provider]  nitroGLYCERIN (NITROSTAT) 0.4 MG SL tablet Place 1 tablet (0.4 mg total) every 5 (five) minutes as needed under the tongue for chest pain. 04/16/17   Leone [place], MD  omeprazole (PRILOSEC) 40 MG capsule TAKE 1 CAPSULE (40 MG TOTAL) BY MOUTH 2 (TWO) TIMES DAILY BEFORE A MEAL. FOR HEARTBURN. 07/15/20   Thornton Park, MD  pravastatin (PRAVACHOL) 80 MG tablet Take 1 tablet (80 mg total) by mouth daily. For cholesterol.  03/22/20   Minna Merritts, MD  warfarin (COUMADIN) 7.5 MG tablet Take 0.5-1 tablets (3.75-7.5 mg total) by mouth See admin instructions. Take 7.5mg  on sundays, all other days take 3.75mg  or as directed by coumadin clinic. Patient taking differently: Take 3.75-7.5 mg by mouth See admin instructions. Take 3.75mg  on Monday,Wednesday and Fridays, 7.5 mg all the other days. 11/02/18   Pleas Koch, NP    Allergies    Bactrim [sulfamethoxazole-trimethoprim], Ciprofloxacin, and Crestor [rosuvastatin calcium]  Review of Systems   Review of Systems Ten systems reviewed and are negative for acute change, except as noted in the HPI.   Physical Exam Updated Vital Signs BP (!) 182/108   Pulse 62   Temp 97.7 F (36.5 C) (Oral)   Resp (!) 9   SpO2 97%   Physical Exam Vitals and nursing note reviewed.  Constitutional:      General: He is not in acute distress.    Appearance: He is well-developed. He is not diaphoretic.  HENT:     Head: Normocephalic and atraumatic.  Eyes:     General: No scleral icterus.    Extraocular Movements: Extraocular movements intact.     Conjunctiva/sclera: Conjunctivae normal.     Pupils: Pupils are equal, round, and reactive to light.  Cardiovascular:     Rate and Rhythm: Normal rate and regular rhythm.     Heart sounds: Normal heart sounds.  Pulmonary:     Effort: Pulmonary effort is normal. No respiratory distress.     Breath sounds: Normal breath sounds.  Abdominal:     Palpations: Abdomen is soft.     Tenderness: There is no abdominal tenderness.  Musculoskeletal:     Cervical back: Normal range of motion and neck supple.  Skin:    General: Skin is warm and dry.  Neurological:     Mental Status: He is alert.     Sensory: No sensory  deficit.     Motor: Weakness present.     Coordination: Coordination abnormal.     Gait: Gait abnormal.     Comments: Speech is dysarthric but goal oriented Cranial nerves without deficit- no nystagmus Strength  is 5/5 R U/LE, 4/5 L U/LE senation normal to light touch  Intention tremor on the Right Dysmetria on the R with F-N   No clonus  DTRs 2+ R, 3+ L Patient requires significant assistance sitting upright in the bed and seems to have difficulty balancing and using muscles of posture to stabilize upper body in the sitting position Patient is able to stand but requires 2 person assist His gait is very careful and he has to adjust several times for near loss of balance  Psychiatric:        Behavior: Behavior normal.     ED Results / Procedures / Treatments   Labs (all labs ordered are listed, but only abnormal results are displayed) Labs Reviewed  PROTIME-INR - Abnormal; Notable for the following components:      Result Value   Prothrombin Time 26.3 (*)    INR 2.5 (*)    All other components within normal limits  APTT - Abnormal; Notable for the following components:   aPTT 42 (*)    All other components within normal limits  COMPREHENSIVE METABOLIC PANEL - Abnormal; Notable for the following components:   Glucose, Bld 121 (*)    Calcium 8.8 (*)    All other components within normal limits  I-STAT CHEM 8, ED - Abnormal; Notable for the following components:   Glucose, Bld 117 (*)    All other components within normal limits  CBC  DIFFERENTIAL  CBG MONITORING, ED    EKG EKG Interpretation  Date/Time:  Wednesday August 21 2020 12:31:28 EDT Ventricular Rate:  63 PR Interval:    QRS Duration: 109 QT Interval:  462 QTC Calculation: 473 R Axis:   44 Text Interpretation: Sinus rhythm Borderline prolonged PR interval Anteroseptal infarct, old No significant change since last tracing Confirmed by Blanchie Dessert [phone]) on 08/21/2020 12:45:37 PM   Radiology No results found.  Procedures Procedures   Medications Ordered in ED Medications  sodium chloride flush (NS) 0.9 % injection 3 mL (3 mLs Intravenous Not Given 08/21/20 1240)    ED Course  I have reviewed the triage  vital signs and the nursing notes.  Pertinent labs & imaging results that were available during my care of the patient were reviewed by me and considered in my medical decision making (see chart for details).  Clinical Course as of 08/21/20 2126  Wed Aug 21, 2020  1511 CT imaging normal- will progress with  [AH]    Clinical Course User Index [AH] Margarita Mail, PA-C   MDM Rules/Calculators/A&P                          [id number] VS:  Vitals:   08/21/20 1500 08/21/20 1530 08/21/20 1600 08/21/20 1830  BP: (!) 179/97 (!) 179/101 (!) 179/99 (!) 176/108  Pulse: (!) 54 (!) 54 (!) 58 (!) 58  Resp: 10 10 12 12   Temp:    98.4 F (36.9 C)  TempSrc:    Oral  SpO2: 99% 99% 100% 100%  Weight:      Height:        QQ:VZDGLOV is gathered by patient and wife. Previous records obtained and reviewed. DDX:The patient's complaint of vertigo involves an extensive number  of diagnostic and treatment options, and is a complaint that carries with it a high risk of complications, morbidity, and potential mortality. Given the large differential diagnosis, medical decision making is of high complexity. The emergent differential diagnosis for acute vertigo low includes peripheral causes such as BPPV, barotrauma, ear foreign body, Mnire's disease, infectious causes such as lip bronchitis, vestibular neuritis or Ramsay Hunt syndrome.  Other emergent causes are central such as cerebellar stroke, vertebrobasilar insufficiency, neoplastic causes, vertebral artery dissection, MS, neurosyphilis or tuberculosis, epilepsy or migraine.  Other causes include anemia, hyperviscosity syndrome, alcohol or aminoglycoside use, renal failure, hypoglycemia and thyroid disease. Labs: I ordered reviewed and interpreted labs which include CBC without abnormality INR 2.5, CMP without significant abnormality.  Imaging: I ordered and reviewed images which included CT angiogram head and neck with and without contrast as well as MR  brain and MR C-spine with and without contrast. I independently visualized and interpreted all imaging. There are no acute, significant findings on today's images. EKG: Sinus rhythm at a rate of 63 Consults: MDM: 75 year old male here with vertigo.  No evidence of cerumen impaction on exam.  This appears to be peripheral vertigo after extensive work-up.  Patient secondarily has significant weakness which I think will need outpatient follow-up with neurology.  I have ordered an ambulatory referral for further evaluation by neurology.  All findings discussed with the patient.  He is to follow-up with his primary care physician for management of his blood pressure which has been hypertensive here.  Will discharge with Antivert for management of his vertiginous symptoms.  He is ambulatory with aids and with help.  Appears appropriate for discharge at this time. Patient disposition:The patient appears reasonably screened and/or stabilized for discharge and I doubt any other medical condition or other [facility] requiring further screening, evaluation, or treatment in the ED at this time prior to discharge. I have discussed lab and/or imaging findings with the patient and answered all questions/concerns to the best of my ability.I have discussed return precautions and OP follow up.    Final Clinical Impression(s) / ED Diagnoses Final diagnoses:  Vertigo  Weakness    Rx / DC Orders ED Discharge Orders    None       Margarita Mail, PA-C 08/21/20 2130    Blanchie Dessert, MD 08/28/20 [phone]

## 2020-08-21 NOTE — Discharge Instructions (Signed)
Get help right away if you: Have difficulty moving or speaking. Are always dizzy. Faint. Develop severe headaches. Have weakness in your hands, arms, or legs. Have changes in your hearing or vision. Develop a stiff neck. Develop sensitivity to light. 

## 2020-08-21 NOTE — ED Notes (Signed)
Patient transported to MRI 

## 2020-08-21 NOTE — Telephone Encounter (Signed)
Patient calls nurse line regarding severe dizziness and elevated BP. States that BP is 186/111. Also reports that he is having cold sweats and nausea. Denies current CP, blurry vision, SHOB, jaw tightness or arm weakness.   Advised patient that due to the above mentioned symptoms, ED evaluation is recommended. Patient verbalizes understanding. Spoke with precepting doctor who was in agreement with ED eval.   Talbot Grumbling, RN

## 2020-08-21 NOTE — ED Triage Notes (Signed)
Patient complains of dizziness and nausea present when the patient woke this morning. Went to sleep at approximately 2300 last night, asymptomatic at that time. Patient alert, oriented, and in no apparent distress at this time. No arm drift, grip strength equal bilaterally, pupils equal round and reactive to light, speech is clear, sensation is equal bilaterally on face, arms, and legs.

## 2020-08-21 NOTE — ED Notes (Signed)
Pt d/c by MD and is provided w/ d/c instructions and follow up care, Pt out of ED in wheel chair with family

## 2020-08-22 ENCOUNTER — Ambulatory Visit (INDEPENDENT_AMBULATORY_CARE_PROVIDER_SITE_OTHER): Payer: Medicare HMO | Admitting: Interventional Cardiology

## 2020-08-22 DIAGNOSIS — Z5181 Encounter for therapeutic drug level monitoring: Secondary | ICD-10-CM | POA: Diagnosis not present

## 2020-08-22 NOTE — Patient Instructions (Addendum)
Description   - Spoke to pt on the telephone and instructed him to continue same warfarin dosage 1 tablet every day EXCEPT 1/2 tablet on MONDAYS, [place]. - Recheck INR in 6 weeks

## 2020-08-23 ENCOUNTER — Telehealth: Payer: Self-pay | Admitting: Family Medicine

## 2020-08-23 NOTE — Telephone Encounter (Signed)
Called patient to discuss lab results.  CBC, ferritin, B12, and TSH all unremarkable.  LDL 85, above goal of less than 70.  Discussed with patient to restart pravastatin daily.  He has taken this for years without any problems, but stopped recently due to concerns that it was causing his leg pains.  However, he is still having leg pain and does not feel like it is related to the pravastatin.  Therefore, he agrees to restarting the pravastatin daily.  Also follow-up on patient as he was in the ED earlier this week for dizziness.  He states that dizziness has resolved and he only had dizziness that 1 day.  Neurology referral was ordered by the ED provider and patient is still awaiting a call for appointment.

## 2020-08-27 ENCOUNTER — Ambulatory Visit: Payer: Medicare HMO | Admitting: Neurology

## 2020-08-27 ENCOUNTER — Other Ambulatory Visit: Payer: Self-pay

## 2020-08-27 ENCOUNTER — Encounter: Payer: Self-pay | Admitting: Neurology

## 2020-08-27 VITALS — BP 159/92 | HR 61 | Ht 72.0 in | Wt 189.3 lb

## 2020-08-27 DIAGNOSIS — R531 Weakness: Secondary | ICD-10-CM

## 2020-08-27 DIAGNOSIS — R030 Elevated blood-pressure reading, without diagnosis of hypertension: Secondary | ICD-10-CM | POA: Diagnosis not present

## 2020-08-27 DIAGNOSIS — Z8673 Personal history of transient ischemic attack (TIA), and cerebral infarction without residual deficits: Secondary | ICD-10-CM

## 2020-08-27 DIAGNOSIS — I951 Orthostatic hypotension: Secondary | ICD-10-CM

## 2020-08-27 DIAGNOSIS — R42 Dizziness and giddiness: Secondary | ICD-10-CM | POA: Diagnosis not present

## 2020-08-27 NOTE — Patient Instructions (Addendum)
It was good to see you again today.  Thankfully, a recent stroke was ruled out in the emergency room.  I am glad to hear that you are feeling better.  Please continue with your exercises and building your endurance.  I think it is important that you get a sooner follow-up appointment than scheduled with your primary care to discuss blood pressure management, your blood pressure was very high in the emergency room and they ER provider recommended follow-up for blood pressure management as well.  You may have had a bout of vertigo.    Please continue to try to stay well-hydrated.  Change positions slowly and I would recommend you use your cane at all times.  Vertigo symptoms can get worse if you are sleep deprived.  You could add some melatonin at night, about 3 or 5 mg, approximately an hour before bedtime.    Try to drink 6 to 8 cups of water per day, 8 ounce size each.  Your blood pressure also drops when you stand up.  This can cause you to feel suddenly lightheaded or faint.  Please talk to your primary care physician about this as well.  At this juncture, we do not need to add any more test from my end of things, I would like for you to follow-up with your primary care on a regular basis, we can see you back in this clinic on an as-needed basis.  Please avoid drinking alcohol altogether.

## 2020-08-27 NOTE — Progress Notes (Signed)
Subjective:    Patient ID: [name] is a 75 y.o. male.  HPI     Interim history:   [name] is a 75 year old right-handed gentleman with an underlying complex medical history of coronary artery disease, history of stroke with residual left-sided weakness, A. fib, BPH, depression, sleep apnea, hypertension, hyperlipidemia, reflux disease and borderline overweight state, who presents for a new problem visit as a referral from the ED for vertigo. He is accompanied by his wife today. I last saw him on 12/05/2018, at which time he reported that his weakness was better.  We talked about stroke risk factors and secondary prevention.  He was advised to proceed with a carotid Doppler ultrasound.  He had a carotid Doppler ultrasound on 12/14/2020 and I reviewed the results: Summary:  Right Carotid: There is no evidence of stenosis in the right ICA.   Left Carotid: There is no evidence of stenosis in the left ICA.   Vertebrals: Bilateral vertebral arteries demonstrate antegrade flow.      Today, 08/27/20: He reports feeling a Little stronger in the last few days.  He felt weaker in his legs and symptoms of vertigo lasted for about 2 days total.  They suddenly started in the morning.  He typically takes his blood pressure medication at 730 every morning and by 930, when he was standing up to go to the bathroom he felt severely off balance and almost fell.  Since he was in the hospital in December for RSV, he has felt weaker and deconditioned.  I reviewed the hospital records from his admission from 05/08/2020 through 05/12/2020, principal diagnosis was COPD exacerbation, positive for RSV.  He was discharged on prednisone and was treated with azithromycin he is currently working with physical therapy once a month and is also following some of the exercises they gave him. He presented to the emergency room on 08/21/2020 with sudden onset of imbalance.  I reviewed the emergency room records.  He had  multiple imaging tests done, which I reviewed, CT angiogram head and neck with and without contrast on 08/21/2020 showed: IMPRESSION: 1. Mild atherosclerosis in the head and neck without a large vessel occlusion or significant proximal stenosis. 2. No evidence of acute intracranial abnormality. 3. Mild chronic small vessel ischemic disease and cerebral atrophy. 4. Aortic Atherosclerosis (ICD10-I70.0).  MRI brain with and without contrast on 08/21/2020 showed: IMPRESSION: 1. No acute intracranial abnormality. 2. Mild-to-moderate chronic small vessel ischemic disease.  MRI cervical spine with and without contrast on 08/21/2020 showed: IMPRESSION: 1. Disc degeneration greatest at C5-6 where there is moderate bilateral neural foraminal stenosis. 2. No spinal stenosis. 3. Normal appearance of the cervical spinal cord.  His blood pressure was quite elevated throughout the emergency room stay.  He was treated symptomatically with meclizine.  He was advised to follow-up with his primary care for his blood pressure management.  He is not seen his primary care yet, has an appointment pending for mid April.  He is not sleeping well at night.  He has Remeron at night for sleep but does not believe it has been very helpful.  He has leg pains at night, almost like restless legs but also sometimes like a cramp.  He tries to drink about 3 large glasses of water with meals and 1 glass of cranberry juice, 1 or 2 cups of coffee per day.  He drinks 1 glass of diluted wine per day or less than one per day typically currently.   Previously:  He was seen in the emergency room on 08/02/2018 with left-sided weakness.  He had a brain MRI without contrast on 06/02/2019 and I reviewed the results: IMPRESSION: 1. No acute intracranial abnormality. 2. Small focus of chronic hemorrhage in the left cerebellum. 3. Chronic ischemic microangiopathy and generalized atrophy.   He had a head CT without contrast on 08/02/2018 and  I reviewed the results: IMPRESSION: 1. No acute intracranial findings. 2. Atrophy and white matter microvascular disease.   He recently saw his cardiologist, he was deemed stable.  He has seen pulmonology for his sleep apnea but is no longer on CPAP and also tried BiPAP therapy but could not tolerate it.  He tries to hydrate fairly well, estimates that he drinks about 2 to 316 ounce bottles of water per day.  He does drink alcohol daily, per wife about 1 to 2 glasses of wine per evening.  He continues to have difficulty falling asleep.       I have previously evaluated him about a year ago for tremors.  Exam was negative for parkinsonism and he did not have any significant tremor at the time   10/07/2017: 75 year old right-handed gentleman with an underlying medical history of hypertension, hyperlipidemia, reflux disease, sleep apnea, depression, coronary artery disease, BPH, A. fib, prior history of stroke, who reports an intermittent right hand tremor for the past year or so. I reviewed your office note from 09/16/2017.  Tremor is not very bothersome. He does not have a family history of tremors or Parkinson's disease. He reports that he had 2 strokes in the past, reports residual weakness on the left, has a cane but does not always use it. He had recent sleep study testing and is supposed to start PAP therapy.  He had a head CT without contrast on 07/16/2017 and I reviewed the results: IMPRESSION: No CT evidence for acute intracranial abnormality. Atrophy and mild small vessel ischemic changes of the white matter.    He states that he had a stroke some 12 years ago. He also has an intermittent tremor in the right hand. He is retired, he lives with his wife, they have 1 child. He quit smoking some 12 years ago, drinks alcohol in the form of wine, about 2 glasses per day on average and caffeine in the form of coffee and tea and sodas, he does not like to drink water. He does not do much in the way  of formal exercise but walks their dogs.   Patient brought in records from his previous doctors. He was previously evaluated with a neuropsychological evaluation in 2007 with motor and cognitive deficits consistent with prior stroke. He was diagnosed with right basal ganglia lacunar stroke in 2006. He was diagnosed with A. fib in 2006 and placed on Coumadin.  His Past Medical History Is Significant For: Past Medical History:  Diagnosis Date  . BPH (benign prostatic hyperplasia)   . COPD (chronic obstructive pulmonary disease) ([place]) 2021  . Coronary artery disease    a. s/p PCI to LCx in 2007; b. MV 2017 no sig ischemia, EF 59%, nl study  . Depression   . GERD (gastroesophageal reflux disease)   . Hyperlipidemia   . Hypertension   . IBS (irritable bowel syndrome)   . Myocardial infarction ([place])    12-13 yrs ago   . PAF (paroxysmal atrial fibrillation) ([place])    a. CHADS2VASc => 5 (HTN, age x 1, stroke x 2, vascular disease); b. on Coumadin   .  PNA (pneumonia)   . Skin cancer    face   . Sleep apnea    off cpap   . Stroke [facility])    a. x 2 with residual left-sided weakness    His Past Surgical History Is Significant For: Past Surgical History:  Procedure Laterality Date  . ADENOIDECTOMY  1951  . CARDIAC CATHETERIZATION  2006   X1 STENT  . COLONOSCOPY    . CORONARY ANGIOPLASTY    . POLYPECTOMY    . RIGHT/LEFT HEART CATH AND CORONARY ANGIOGRAPHY N/A 01/25/2020   Procedure: RIGHT/LEFT HEART CATH AND CORONARY ANGIOGRAPHY;  Surgeon: Minna Merritts, MD;  Location: [facility];  Service: Cardiovascular;  Laterality: N/A;  . SKIN CANCER EXCISION N/A 08/2016   [place].  [facility] (Dr. Deliah Boston)  . THYROIDECTOMY, PARTIAL    . UPPER GASTROINTESTINAL ENDOSCOPY    . VASCULAR SURGERY     varicose vein stripping left leg    His Family History Is Significant For: Family History  Problem Relation Age of Onset  . Heart Problems Father   . Heart disease  Father   . Hypertension Father   . Diabetes Father   . Mental illness Mother   . Arthritis Mother   . Cancer Mother        ovarian  . Stroke Paternal Uncle   . Heart disease Paternal Grandfather   . Cancer Maternal Aunt   . Stroke Maternal Grandmother   . Sudden death Maternal Grandfather   . Heart disease Paternal Grandmother   . Colon cancer Neg Hx   . Esophageal cancer Neg Hx   . Rectal cancer Neg Hx   . Colon polyps Neg Hx   . Stomach cancer Neg Hx     His Social History Is Significant For: Social History   Socioeconomic History  . Marital status: Married    Spouse name: Not on file  . Number of children: 1  . Years of education: Not on file  . Highest education level: Not on file  Occupational History  . Occupation: retired  Tobacco Use  . Smoking status: Former Smoker    Years: 20.00    Types: Cigarettes  . Smokeless tobacco: Never Used  Vaping Use  . Vaping Use: Never used  Substance and Sexual Activity  . Alcohol use: Yes    Alcohol/week: 2.0 - 3.0 standard drinks    Types: 2 - 3 Glasses of wine per week    Comment: Wine daily with dinner most days  . Drug use: No  . Sexual activity: Never  Other Topics Concern  . Not on file  Social History Narrative  . Not on file   Social Determinants of Health   Financial Resource Strain: Not on file  Food Insecurity: Not on file  Transportation Needs: Not on file  Physical Activity: Not on file  Stress: Not on file  Social Connections: Not on file    His Allergies Are:  Allergies  Allergen Reactions  . Bactrim [Sulfamethoxazole-Trimethoprim] Anaphylaxis    Ulcers  . Ciprofloxacin Swelling    Tongue  . Crestor [Rosuvastatin Calcium]     Severe Myalgias  :   His Current Medications Are:  Outpatient Encounter Medications as of 08/27/2020  Medication Sig  . acetaminophen (TYLENOL) 500 MG tablet Take 1,000 mg by mouth every 6 (six) hours as needed for mild pain.  . Alirocumab (PRALUENT) 75 MG/ML SOAJ  Inject 75 mg into the skin every 14 (fourteen) days.  Marland Kitchen  amiodarone (PACERONE) 100 MG tablet Take 1 tablet (100 mg total) by mouth daily. (Patient taking differently: Take 200 mg by mouth daily.)  . amLODipine (NORVASC) 2.5 MG tablet Take 1 tablet (2.5 mg total) by mouth at bedtime.  Marland Kitchen aspirin 81 MG tablet Take 81 mg by mouth at bedtime.   . bisoprolol (ZEBETA) 10 MG tablet Take 1.5 tablets (15 mg total) by mouth daily.  . Cholecalciferol (VITAMIN D3) 50 MCG (2000 UT) TABS Take 2,000 Units by mouth at bedtime.   . DULoxetine (CYMBALTA) 20 MG capsule Take 40 mg by mouth daily.  . fluticasone (FLONASE) 50 MCG/ACT nasal spray Place into both nostrils.  . hydrocortisone 2.5 % cream Apply topically daily as needed.  Marland Kitchen lisinopril (ZESTRIL) 40 MG tablet TAKE 1 TABLET (40 MG TOTAL) BY MOUTH DAILY. FOR BLOOD PRESSURE.  . meclizine (ANTIVERT) 25 MG tablet Take 1 tablet (25 mg total) by mouth 3 (three) times daily as needed for dizziness.  . mirtazapine (REMERON) 15 MG tablet Take 15 mg by mouth at bedtime.  . Multiple Vitamins-Minerals (MULTIVITAMIN WITH MINERALS) tablet Take 1 tablet by mouth daily. Centrum Silver  . nitroGLYCERIN (NITROSTAT) 0.4 MG SL tablet Place 1 tablet (0.4 mg total) every 5 (five) minutes as needed under the tongue for chest pain.  Marland Kitchen omeprazole (PRILOSEC) 40 MG capsule TAKE 1 CAPSULE (40 MG TOTAL) BY MOUTH 2 (TWO) TIMES DAILY BEFORE A MEAL. FOR HEARTBURN.  . pravastatin (PRAVACHOL) 80 MG tablet Take 1 tablet (80 mg total) by mouth daily. For cholesterol.  . warfarin (COUMADIN) 7.5 MG tablet Take 0.5-1 tablets (3.75-7.5 mg total) by mouth See admin instructions. Take 7.5mg  on sundays, all other days take 3.75mg  or as directed by coumadin clinic. (Patient taking differently: Take 3.75-7.5 mg by mouth See admin instructions. Take 3.75mg  on Monday,Wednesday and Fridays, 7.5 mg all the other days.)  . ezetimibe (ZETIA) 10 MG tablet Take 1 tablet (10 mg total) by mouth daily. (Patient not  taking: Reported on 07/17/2020)  . furosemide (LASIX) 20 MG tablet Take 20 mg by mouth daily as needed for fluid or edema.  (Patient not taking: Reported on 08/27/2020)  . KLOR-CON M10 10 MEQ tablet Take 10 mEq by mouth every other day. (Patient not taking: Reported on 08/27/2020)  . [DISCONTINUED] albuterol (VENTOLIN HFA) 108 (90 Base) MCG/ACT inhaler Inhale 2 puffs into the lungs every 6 (six) hours as needed for wheezing or shortness of breath.   Facility-Administered Encounter Medications as of 08/27/2020  Medication  . sodium chloride flush (NS) 0.9 % injection 3 mL  :  Review of Systems:  Out of a complete 14 point review of systems, all are reviewed and negative with the exception of these symptoms as listed below: Review of Systems  Neurological:       Here for f/u from ED for vertigo. Pt reports sx started two weeks ago. Pt reports he was having trouble with his balance and h/a. Pt has hx of stroke and went for evaluation. Pt reports prior this event vertigo has not been an issue for him. Wife questions if the pt should be tested for Lyme's disease given his hx and sx.    Objective:  Neurological Exam  Physical Exam  Physical Examination:   Vitals:   08/27/20 0809  BP: (!) 159/92  Pulse: 61   On orthostatic testing, his standing blood pressure is 135/85 with a pulse of 64.  He denies any significant orthostatic dizziness or vertigo symptoms at this time.  General Examination: The patient is a very pleasant 75 y.o. male in no acute distress. He appears well-developed and well-nourished and well groomed.   HEENT:Normocephalic, atraumatic, pupils are equal, round and reactive to light, extraocular tracking is well-preserved, no facial weakness or facial masking noted.  Airway examination reveals mild mouth dryness, tongue protrudes centrally and palate elevates symmetrically.  No nystagmus noted, no vertiginous symptoms on sudden head position changes, no significant nuchal  rigidity.   Chest:Clear to auscultation without wheezing, rhonchi or crackles noted.  Heart:S1+S2+0, regular and normal without murmurs, rubs or gallops noted.   Abdomen:Soft, non-tender and non-distended with normal bowel sounds appreciated on auscultation.  Extremities:There isnopitting edema in the distal lower extremities bilaterally.   Skin: Warm and dry without trophic changes noted. Chronic appearing discoloration and well as spider veins In the distal lower extremities bilaterally, evidence of varicose veins, left more than right leg.  Musculoskeletal: exam reveals no obvious joint deformities, tenderness or joint swelling or erythema.   Neurologically:  Mental status: The patient is awake, alert and oriented in all 4 spheres.Hisimmediate and remote memory, attention, language skills and fund of knowledge are fairly appropriate. There is no evidence of aphasia, agnosia, apraxia or anomia. Speech is clear with normal prosody and enunciation. Thought process is linear. Mood is normaland affect is normal.  Cranial nerves II - XII are as described above under HEENT exam. In addition: shoulder shrug is normal with equal shoulder height noted. Motor exam: Normal bulk, strength and tone is noted with slight weakness in the left upper and lower extremity, somewhat slower movements on the left side. There is no drift,restingtremor or postural tremor, no action tremor in the upper extremities.  (On5/07/2017: on Archimedes spiral drawing he has slight insecurity with the left more than right hand, no significant trembling. Handwriting is legible, not tremulous, not micrographic.)  Fine motor skills withcoordination: preserved in the upper and lower extremities. Slight slowness noticed with hand movements, finger taps and foot taps on the left, no decrement in amplitude. Cerebellar testing: No dysmetria or intention tremor on finger to nose testing. Sensory exam: intact to  light touch. Gait, station and balance:Hestandswithout difficulty, requires no assistance, he walks with a slight limp on the left, not new. He brought his cane today but was able to walk without it back and forth in the room, walks somewhat slowly, no obvious foot drop noted.  No shuffling noted, preserved arm swing.  Assessmentand Plan:   In summary,Evangelos Montgomeryis a very pleasant 22 year oldmalewith an underlying medical history of hypertension, hyperlipidemia, reflux disease, sleep apnea, depression, coronary artery disease, BPH, A. fib, prior history of stroke, and prior smoking, who presents for evaluation of his vertigo.  He has a history of left cerebellar stroke.  He presented to the ER on 08/21/2020 with sudden onset of severe imbalance.  A stroke was excluded, he had multiple imaging tests including CT angiogram of the head and neck, MRI of the brain with and without contrast and MRI neck.  He has improved, he had significantly elevated blood pressure readings throughout the ER stay and also today.  He is advised to make a follow-up appointment with his primary care physician to discuss optimization of blood pressure management.  In addition, he is advised to stand up slowly, change positions slowly, uses cane at all times and continue with his exercises and strengthening program.  We talked about the importance of healthy lifestyle.  He is advised to stay well-hydrated with water, 6  to 8 cups of water per day are recommended, 8 ounce size each.  He is advised to abstain completely from alcohol.  His wife was worried about Parkinson's disease.  I did not see any signs of parkinsonism and reassured him.  She was wondering if he had Lyme disease.  She reports that he had a tick bite last summer.  He did not have any symptoms afterwards.  He did not have a rash.  They are advised to talk to the primary care physician about this as well.  In addition, he had a drop in his blood pressure of  over 20 points in the systolic blood pressure and a milder drop in the diastolic blood pressure when standing, did not really have any orthostatic lightheadedness or vertiginous symptoms today.  Nevertheless, he is advised that he can have orthostatic dizziness should he drop his blood pressure suddenly.  He is advised to continue with all his other medications, he could consider adding melatonin at night to help him sleep.  He is already on Remeron but a small dose of melatonin could be added.  He has had stable residual difficulty with fine motor control and walking affecting his left side, residual from his stroke from 2006. We talked about the importance of secondary stroke prevention previously.  He is also followed by cardiology and pulmonology.  He is advised to follow-up with his providers as scheduled and try to move up his primary care appointment to a sooner than scheduled appointment since he has had elevated blood pressure values.  When his wife checked his blood pressure on 08/21/2020, it was in the 188/111 range as she recalls.  He is advised to follow-up in this clinic on an as-needed basis.  I answered all her questions today and the patient and his wife were in agreement. I spent 40 minutes in total face-to-face time and in reviewing records during pre-charting, more than 50% of which was spent in counseling and coordination of care, reviewing test results, reviewing medications and treatment regimen and/or in discussing or reviewing the diagnosis of Vertigo, the prognosis and treatment options. Pertinent laboratory and imaging test results that were available during this visit with the patient were reviewed by me and considered in my medical decision making (see chart for details).

## 2020-08-28 ENCOUNTER — Other Ambulatory Visit: Payer: Self-pay

## 2020-08-28 ENCOUNTER — Ambulatory Visit (INDEPENDENT_AMBULATORY_CARE_PROVIDER_SITE_OTHER): Payer: Medicare HMO | Admitting: Family Medicine

## 2020-08-28 DIAGNOSIS — R42 Dizziness and giddiness: Secondary | ICD-10-CM

## 2020-08-28 DIAGNOSIS — I10 Essential (primary) hypertension: Secondary | ICD-10-CM | POA: Diagnosis not present

## 2020-08-28 NOTE — Assessment & Plan Note (Addendum)
Acute. Postural dizziness and imbalance. Orthostatic testing positive (BP 159/92 with pulse 61, standing BP 135/85 with pulse 64). Lack of compensation of HR is suspicious for neurogenic etiology. No acute findings on CTA head and neck or MRI brain other than chronic ischemic changes and cerebral atrophy. On multiple antihypertensives for secondary prevention of CVA and coumadin for a. Fib. Medications reviewed and notable for antidepressant and Mirtazipine which increase risk of orthostatic hypotension in the elderly   - plan as above - could consider tapering off of Celexa and Mirtazipine to help improve risk factors

## 2020-08-28 NOTE — Patient Instructions (Addendum)
It was a pleasure to see you today!  Thank you for choosing [facility] for your primary care.   Our plans for today were:  Please follow up with cardiology for medication adjustment   Please follow up with your Dr. Nancy Fetter, your family doctor, for continued evaluation and leg pain.    For your orthostatic dizziness - wear compression stockings during the day. Rise slowly from each new position and walk next to a surface you can hold on to. Get adequate water intake each day and some salt to help.   Continue with Physical therapy    Best Wishes,   Mina Marble, DO

## 2020-08-28 NOTE — Progress Notes (Signed)
Subjective:   Patient ID: [name]    DOB: 19-Mar-1946, 75 y.o. male   MRN: [id number]  [name] is a 75 y.o. male with a history of paroxysmal A. fib, CAD, hypertension, COPD, sleep apnea, GERD, IBS, history of left-sided weakness, eczema, history of statin myopathy, history of testicular atrophy, anxiety/depression, chronic fatigue, chronic pain of both lower extremities, constipation, dyspnea on exertion, ED, history of CVA, hyperlipidemia, palpitations, tremor here for blood pressure  HTN  Dizziness:  BP: 130/81 today. He has sporadic blood pressures at home ranging from 140-160's/95-100. Currently on Amlodipine 2.5mg , Lisinopril 40mg  QD, Amiodarone 200mg  QD. Patient stopped taking Lasix given the frequency of urination. Endorses compliance. Evaluated in ED for progressive dizziness and dizziness. He had CTA head and neck and MRI without acute findings, notable for chronic ischemic changes and cerebral atrophy. He has had multiple elevated BP's in the ED and at the neurology office. Has seen by neurology and noted to have positive orthostatics with drop in systolic's by 20 points. Symptoms of dizziness occurs when he stands up and improves when he improves. Non-smoker. Denies any chest pain, SOB, vision changes, or headaches.   He has history of CVA in 2006 and diagnosed with A.fib in 2006 placed on Coumadin and Amiodarone. He has a history of falls and uses a cane at baseline.   Lab Results  Component Value Date   CREATININE 0.80 08/21/2020   CREATININE 0.93 08/21/2020   CREATININE 0.97 05/08/2020   Review of Systems:  Per HPI.   Objective:   BP 130/81   Pulse 65   Ht 6' (1.829 m)   Wt 188 lb 12.8 oz (85.6 kg)   SpO2 100%   BMI 25.61 kg/m  Vitals and nursing note reviewed.  General: pleasant elderly male, sitting comfortably in exam chair with wife at his side, well nourished, well developed, in no acute distress with non-toxic appearance Resp: breathing  comfortably on room air, speaking in full sentences Neuro: Alert and oriented, speech normal  Assessment & Plan:   Essential hypertension Chronic, normotensive today. Has reported elevated home readings and documented elevated blood pressures in the ED and at neurology office. Orthostatic measurement positive concerning for orthostatisis contributing to his dizziness. I am hesitant to make changes to his blood pressure regimen given his history of orthostatic hypotension with recent symptoms of dizziness and history of falls currently on coumadin. I feel collaboration with his cardiologist would be helpful. Recommended to continue current medications. Recommended continuing PT for gait assistance and strengthening, adequate hydration, compression stockings, and slow rising from seated/lying position. Recommended to use his cane regularly. Could consider midodrine or fludrocortisone if symptoms persists and nonpharmacologic treatment is ineffective. - recommended follow up with cardiology - follow up with PCP within 4 weeks to discuss leg pain and follow up HTN  Orthostatic dizziness Acute. Postural dizziness and imbalance. Orthostatic testing positive (BP 159/92 with pulse 61, standing BP 135/85 with pulse 64). Lack of compensation of HR is suspicious for neurogenic etiology. No acute findings on CTA head and neck or MRI brain other than chronic ischemic changes and cerebral atrophy. On multiple antihypertensives for secondary prevention of CVA and coumadin for a. Fib. Medications reviewed and notable for antidepressant and Mirtazipine which increase risk of orthostatic hypotension in the elderly   - plan as above - could consider tapering off of Celexa and Mirtazipine to help improve risk factors  No orders of the defined types were placed in this encounter.  No orders of the defined types were placed in this encounter.      Mina Marble, DO PGY-3, [facility] 08/28/2020  8:25 PM

## 2020-08-28 NOTE — Assessment & Plan Note (Addendum)
Chronic, normotensive today. Has reported elevated home readings and documented elevated blood pressures in the ED and at neurology office. Orthostatic measurement positive concerning for orthostatisis contributing to his dizziness. I am hesitant to make changes to his blood pressure regimen given his history of orthostatic hypotension with recent symptoms of dizziness and history of falls currently on coumadin. I feel collaboration with his cardiologist would be helpful. Recommended to continue current medications. Recommended continuing PT for gait assistance and strengthening, adequate hydration, compression stockings, and slow rising from seated/lying position. Recommended to use his cane regularly. Could consider midodrine or fludrocortisone if symptoms persists and nonpharmacologic treatment is ineffective. - recommended follow up with cardiology - follow up with PCP within 4 weeks to discuss leg pain and follow up HTN

## 2020-08-30 ENCOUNTER — Telehealth: Payer: Self-pay | Admitting: Cardiovascular Disease

## 2020-08-30 NOTE — Telephone Encounter (Signed)
Patient would like to switch from Dr. Rockey Situ to either Dr. Marlou Porch or Dr. Irish Lack. The patient states that the [facility] office is closer to all of his other doctors.   Please let the patient know what the office decides

## 2020-08-30 NOTE — Telephone Encounter (Signed)
This was sent to the [facility] pool. Please review. Thanks!

## 2020-09-02 MED ORDER — AMIODARONE HCL 200 MG PO TABS: 200.0000 mg | ORAL_TABLET | Freq: Every day | ORAL | 0 refills | Status: DC

## 2020-09-02 NOTE — Telephone Encounter (Signed)
OK with me.  JV

## 2020-09-03 ENCOUNTER — Ambulatory Visit: Payer: Medicare HMO | Admitting: Physical Therapy

## 2020-09-03 ENCOUNTER — Other Ambulatory Visit: Payer: Self-pay

## 2020-09-03 ENCOUNTER — Encounter: Payer: Self-pay | Admitting: Physical Therapy

## 2020-09-03 VITALS — BP 131/84

## 2020-09-03 DIAGNOSIS — R2689 Other abnormalities of gait and mobility: Secondary | ICD-10-CM

## 2020-09-03 DIAGNOSIS — M6281 Muscle weakness (generalized): Secondary | ICD-10-CM

## 2020-09-03 DIAGNOSIS — M79604 Pain in right leg: Secondary | ICD-10-CM

## 2020-09-03 DIAGNOSIS — M79605 Pain in left leg: Secondary | ICD-10-CM

## 2020-09-03 NOTE — Therapy (Addendum)
[place], [place], [address] Phone: [phone]   Fax:  [phone]  Physical Therapy Treatment / Discharge  Patient Details  Name: [name] MRN: [id number] Date of Birth: 03/01/1946 Referring Provider (PT): Kinnie Feil, MD   Encounter Date: 09/03/2020   PT End of Session - 09/03/20 1404     Visit Number 2    Number of Visits 8    Date for PT Re-Evaluation 10/08/20    Authorization Type [employer]ET[employer] MEDICARE    Authorization Time Period FOTO by 6th, KX by 15th    Progress Note Due on Visit 10    PT Start Time 1402    PT Stop Time 1448    PT Time Calculation (min) 46 min    Activity Tolerance Patient tolerated treatment well    Behavior During Therapy [facility] for tasks assessed/performed             Past Medical History:  Diagnosis Date   BPH (benign prostatic hyperplasia)    COPD (chronic obstructive pulmonary disease) ([place]) 2021   Coronary artery disease    a. s/p PCI to LCx in 2007; b. MV 2017 no sig ischemia, EF 59%, nl study   Depression    GERD (gastroesophageal reflux disease)    Hyperlipidemia    Hypertension    IBS (irritable bowel syndrome)    Myocardial infarction ([place])    12-13 yrs ago    PAF (paroxysmal atrial fibrillation) ([place])    a. CHADS2VASc => 5 (HTN, age x 1, stroke x 2, vascular disease); b. on Coumadin    PNA (pneumonia)    Skin cancer    face    Sleep apnea    off cpap    Stroke ([place])    a. x 2 with residual left-sided weakness    Past Surgical History:  Procedure Laterality Date   [place]  2006   X1 STENT   COLONOSCOPY     CORONARY ANGIOPLASTY     POLYPECTOMY     RIGHT/LEFT HEART CATH AND CORONARY ANGIOGRAPHY N/A 01/25/2020   Procedure: RIGHT/LEFT HEART CATH AND CORONARY ANGIOGRAPHY;  Surgeon: Minna Merritts, MD;  Location: [facility];  Service: Cardiovascular;  Laterality: N/A;   SKIN CANCER EXCISION  N/A 08/2016   [place]Dr. Deliah Boston)   THYROIDECTOMY, PARTIAL     UPPER GASTROINTESTINAL ENDOSCOPY     VASCULAR SURGERY     varicose vein stripping left leg    Vitals:   09/03/20 1407  BP: 131/84     Subjective Assessment - 09/03/20 1405     Subjective I went to the ER on 3/16 for increase in dizziness that was really bad. They ruled out a stroke which is good. They think it has more to do with my blood pressure right now. The dizziness is still there, especially when getting up from laying down, but it is better than it was. The leg exercises are going well.    Currently in Pain? Yes    Pain Score 0-No pain   4/10 at night on average   Pain Location Leg    Pain Orientation Left;Right    Pain Descriptors / Indicators --   restless leg and pain   Pain Onset More than a month ago    Pain Frequency Intermittent  [facility] PT Assessment - 09/03/20 0001       Ambulation/Gait   Gait Comments Patient ambulates with crouched knee position, wide BOS, unsteady/slow gait with cane in R hand      6 Minute Walk- Baseline   6 Minute Walk- Baseline yes    BP (mmHg) 131/84      6 Minute walk- Post Test   6 Minute Walk Post Test yes    Perceived Rate of Exertion ([place]) 11- Fairly light   during rest break at 4.5 minutes     6 minute walk test results    Aerobic Endurance Distance Walked --   420 ft total, 386 ft prior to rest break   Endurance additional comments performed with SPC in R hand; break taken at 4.5 minutes for 1 minute due to dizziness; vitals during rest break: 97% SpO2, 61 bpm, RPE 11      Balance   Balance Assessed Yes      Static Standing Balance   Static Standing Balance -  Activities  --   4 stage balance test with taking the highest score of 3 trials per stage: 1.rhomberg-23 seconds, 2.semitandem with heel at instep-30 seconds, 3. tandem- 30 seconds on the 3rd try (first 2 times only at 6 seconds) 4. SLS on R-8 seconds                                        [facility] Adult PT Treatment/Exercise - 09/03/20 0001       Self-Care   Self-Care Other Self-Care Comments    Other Self-Care Comments  and 4 stage balance test      Knee/Hip Exercises: Stretches   Lobbyist 2 reps;30 seconds    Quad Stretch Limitations thomas stretch edge of mat, with passive overpressure into knee fleixon    Gastroc Stretch 2 reps;30 seconds    Gastroc Stretch Limitations standing at counter, cueing needed for placement of LE, to keep a flat back; needed to lean on forearms to decrease pressure on counter    Other Knee/Hip Stretches bilateral single knee to chest 2x30 seconds with strap      Knee/Hip Exercises: Standing   Heel Raises 2 sets;10 reps    Heel Raises Limitations at counter    Hip Flexion 2 sets;10 reps    Hip Flexion Limitations alternating marching at counter, cues to move back far enough from counter for hip flexion    Hip Abduction 10 reps    Abduction Limitations at counter, pt was performing alternating so cued to perform ipsilateral    Hip Extension 5 reps;Both    Extension Limitations tried to perform with knee extended, unable to; still unable to perform with knee bent due to tightness in hip flexors      Knee/Hip Exercises: Seated   Sit to Sand 2 sets;with UE support   1 set of 5 and then 1 set of 8, with UE on thighs, cues to extend/stand up all the way                         PT Education - 09/03/20 1450     Education Details HEP update, increase frequency of stretching/perform before bed to see if this helps BLE leg sx    Person(s) Educated Patient    Methods Explanation;Demonstration;Tactile cues;Verbal cues;Handout    Comprehension Verbalized understanding;Returned demonstration;Need  further instruction              PT Short Term Goals - 08/13/20 1629       PT SHORT TERM GOAL #1   Title Patient will be I with initial HEP to progress with PT    Time 4     Period Weeks    Status New    Target Date 09/10/20      PT SHORT TERM GOAL #2   Title Patient will be able to perform sit<>stand x 5 reps without UE assist from standard chair to indicate improved strength    Time 4    Period Weeks    Status New    Target Date 09/10/20      PT SHORT TERM GOAL #3   Title PT will review FOTO with patient by 3rd visit    Time 3    Period Weeks    Status New    Target Date 09/03/20      PT SHORT TERM GOAL #4   Title Patient will report 25% improved in pain at night to improve sleeping ability    Time 4    Period Weeks    Status New    Target Date 09/10/20                PT Long Term Goals - 09/03/20 1517       PT LONG TERM GOAL #1   Title Patient will be I with final HEP to maintain progress with PT    Time 8    Period Weeks    Status New    Target Date 10/08/20      PT LONG TERM GOAL #2   Title Patient will report improved functional status >/= 66% functional status on FOTO    Time 8    Period Weeks    Status New    Target Date 10/08/20      PT LONG TERM GOAL #3   Title Patient will exhibit improved hip strength >/= 4/5 MMT and knee strength >/= 5/5 MMT to improve walking tolerance    Time 8    Period Weeks    Status New    Target Date 10/08/20      PT LONG TERM GOAL #4   Title Patient will be able to perform heavy activities around his home without minimal to no difficulty    Time 8    Period Weeks    Status New    Target Date 10/08/20      PT LONG TERM GOAL #5   Title Patient will report 50% improvement in leg pain at night to improve sleeping ability    Time 8    Period Weeks    Status New    Target Date 10/08/20      Additional Long Term Goals   Additional Long Term Goals Yes      PT LONG TERM GOAL #6   Title Pt will improve 6MWT to 584 feet with no rest breaks needed to show MCID.    Baseline 420 ft total, 386 ft prior to rest break    Time 8    Period Weeks    Status New    Target Date 10/08/20       PT LONG TERM GOAL #7   Title Pt will be able to perform tandem for at least 10 seconds on first trial in order to show decrease fall risk on 4 stage balance test.  Baseline 1st trial-6 seconds, 2nd trial-6 seconds, 3rd trial-30 seconds    Time 8    Period Weeks    Status New    Target Date 10/08/20                        Plan - 09/03/20 1453     Clinical Impression Statement Pt tolerated tx well with no adverse effects. 6MWT performed this session and showed that pt is at an increase risk for falls. Dizziness noted during 6MWT at about 4.5 minute mark as well as with supine to sit transitions during tx. 4 stage balance test also performed this session and each position was attempted to be held for 30 seconds instead of the standard 10 seconds, but he still did not meet the required 10 seconds for the SLS. HEP reviewed this session and pt needed cueing to be performed for maximum stretching and LE strengthening. Told to perform stretching exercises before bed to see if this helps decrease his pain, as well as stretching at night if the pain wakes him up. Long term goals updated to include balance goals. Pt states that due to cost of tx, he is only able to come in 1x every 3 weeks. He still continues to benefit from skilled PT in order to decrease LE pain, increase LE ROM and strength, and decrease fall risk.    PT Treatment/Interventions ADLs/Self Care Home Management;Aquatic Therapy;Cryotherapy;Electrical Stimulation;Iontophoresis 69m/ml Dexamethasone;Moist Heat;Neuromuscular re-education;Balance training;Therapeutic exercise;Therapeutic activities;Functional mobility training;Stair training;Gait training;Patient/family education;Manual techniques;Dry needling;Passive range of motion;Taping;Joint Manipulations    PT Next Visit Plan Review HEP and progress PRN, stretching for hamstring and quads, progress hip and general LE strengthening, initiate balance training    PT Home Exercise Plan  [phone]   Consulted and Agree with Plan of Care Patient             Patient will benefit from skilled therapeutic intervention in order to improve the following deficits and impairments:  Abnormal gait,Impaired flexibility,Difficulty walking,Decreased strength,Decreased balance,Pain,Decreased activity tolerance  Visit Diagnosis: Muscle weakness (generalized)  Pain in left leg  Pain in right leg  Other abnormalities of gait and mobility      Problem List Patient Active Problem List   Diagnosis Date Noted   Orthostatic dizziness 08/28/2020   Chronic pain of both lower extremities 07/17/2020   COPD exacerbation ([place] 05/08/2020   Unstable angina ([place] 01/25/2020   Statin myopathy 06/07/2019   Dysuria 04/21/2019   Right elbow pain 12/21/2018   Left-sided weakness 08/12/2018   Chronic obstructive pulmonary disease ([place] 06/13/2018   Abdominal pain 05/27/2018   Wrist pain, acute, right 11/05/2017   Sleep apnea 09/16/2017   Fall 07/21/2017   Constipation 07/21/2017   Chronic fatigue 06/18/2017   Tremor 06/18/2017   Eczema 06/18/2017   Testicular atrophy 04/23/2017   Dyspnea on exertion 03/16/2017   Erectile dysfunction 03/05/2017   Palpitations 12/09/2015   Anxiety and depression 02/08/2015   GERD (gastroesophageal reflux disease) 02/08/2015   IBS (irritable bowel syndrome) 02/08/2015   Essential hypertension 02/08/2015   AF (paroxysmal atrial fibrillation) ([place] 01/23/2015   CAD (coronary artery disease) 01/23/2015   Hyperlipidemia 01/23/2015   History of CVA (cerebrovascular accident) 01/23/2015    CCaleb Little SPT 09/03/2020, 3:36 PM  [facility] [address]Phone: [phone]  Fax:  [phone] Name: CKarsyn RochinMRN: [id number] of Birth: [birth year]   PHYSICAL THERAPY DISCHARGE  SUMMARY  Visits from Start of Care: 2  Current functional level related to  goals / functional outcomes: See above   Remaining deficits: See above   Education / Equipment: HEP  Patient agrees to discharge. Patient goals were not met. Patient is being discharged due to not returning since the last visit.

## 2020-09-03 NOTE — Patient Instructions (Signed)
Access Code: [email or web address] URL: [email or web address] Date: 09/03/2020 Prepared by: Caleb Popp  Exercises Modified [place] - 2 x daily - 7 x weekly - 2 reps - 30 seconds hold Seated Hamstring Stretch - 2 x daily - 7 x weekly - 2 reps - 30 seconds hold Sit to Stand - 1 x daily - 7 x weekly - 3 sets - 5 reps Standing Hip Abduction with Counter Support - 1 x daily - 7 x weekly - 2 sets - 10 reps Standing March with Counter Support - 1 x daily - 7 x weekly - 2 sets - 10 reps Standing Gastroc Stretch at Counter - 2 x daily - 7 x weekly - 2 reps - 30 seconds hold Hooklying Single Knee to Chest Stretch - 1 x daily - 7 x weekly - 3 sets - 30 seconds hold Standing Hip Extension with Counter Support - 1 x daily - 7 x weekly - 2 sets - 10 reps Standing Heel Raise - 1 x daily - 7 x weekly - 2 sets - 10 reps

## 2020-09-05 NOTE — Telephone Encounter (Signed)
Left message to call office

## 2020-09-05 NOTE — Telephone Encounter (Signed)
I spoke with patient and scheduled him to see Dr Irish Lack on May 17,2022 at 4:00

## 2020-09-18 ENCOUNTER — Encounter: Payer: Self-pay | Admitting: Family Medicine

## 2020-09-18 ENCOUNTER — Ambulatory Visit (INDEPENDENT_AMBULATORY_CARE_PROVIDER_SITE_OTHER): Payer: Medicare HMO | Admitting: Family Medicine

## 2020-09-18 ENCOUNTER — Other Ambulatory Visit: Payer: Self-pay

## 2020-09-18 VITALS — BP 130/74 | HR 60 | Ht 72.0 in | Wt 189.2 lb

## 2020-09-18 DIAGNOSIS — R42 Dizziness and giddiness: Secondary | ICD-10-CM

## 2020-09-18 DIAGNOSIS — F419 Anxiety disorder, unspecified: Secondary | ICD-10-CM | POA: Diagnosis not present

## 2020-09-18 DIAGNOSIS — I10 Essential (primary) hypertension: Secondary | ICD-10-CM | POA: Diagnosis not present

## 2020-09-18 DIAGNOSIS — F32A Depression, unspecified: Secondary | ICD-10-CM

## 2020-09-18 DIAGNOSIS — R69 Illness, unspecified: Secondary | ICD-10-CM | POA: Diagnosis not present

## 2020-09-18 MED ORDER — VENLAFAXINE HCL ER 37.5 MG PO CP24: 37.5000 mg | ORAL_CAPSULE | Freq: Every day | ORAL | 2 refills | Status: DC

## 2020-09-18 NOTE — Progress Notes (Signed)
    SUBJECTIVE:   CHIEF COMPLAINT / HPI:   Patient was recently seen in the ED on 3/16 for vertigo.  He had a CTA head and neck as well as MRI of the brain and C-spine which did not reveal any acute findings.  He was discharged on meclizine as needed.  He has not been using any meclizine and his dizziness has resolved.  HTN On lisinopril 40 mg and bisoprolol 15 mg daily. At most recent visit at our clinic with Dr. Tarry Kos, BP was well controlled, measured at 130/81 in office.  However, orthostatic testing was positive at that visit.  No medication changes were made regarding his antihypertensives. He was on amlodipine before but stopped taking this recently. Orthostatic dizziness has improved, now only having symptoms once a week. Blood pressures at home in the 130s-150s/80s-90s.  Depression and anxiety Patient stopped taking his mirtazapine recently.  He feels that his leg pain has improved since stopping the medication. He is asking about weaning down on his duloxetine.  He changed insurances and his insurance no longer has good coverage of duloxetine and will cost $150 for a 1 month supply. He feels his mood is pretty good overall and does not feel that he needs the medication. He was seeing a therapist but the therapist is no longer covered by his insurance and he did not feel he was getting a lot of benefit from that therapist.  PERTINENT  PMH / [place]: CAD (followed by cardiology), A Fib (on warfarin), COPD, HTN, GERD, CVA(residual deficits left arm, left leg),HLD  OBJECTIVE:   BP 130/74   Pulse 60   Ht 6' (1.829 m)   Wt 189 lb 3.2 oz (85.8 kg)   SpO2 96%   BMI 25.66 kg/m   General: Elderly male, NAD CV: RRR, no murmurs Pulm: CTAB, no wheezes or rales   ASSESSMENT/PLAN:   Essential hypertension Well-controlled on lisinopril 40 mg.  Also on bisoprolol 15 mg daily.  He stopped taking his amlodipine.  Blood pressure in office 130/74.  Orthostatic hypotension symptoms have  significantly improved, now only having symptoms about once a week when standing.  We will continue the current medications. - consider ambulatory BP monitoring if having more orthostatic symptoms  Anxiety and depression Appears to be well controlled.  Patient's desire to wean off of SNRI is mainly driven by cost.  Preferably should remain on SNRI, likely not a significant contributor to dizziness.  We will switch from duloxetine to lower dose venlafaxine.  Follow-up in 1 month, can uptitrate if needed.  Offered counseling resources but patient declined.     Zola Button, MD [place]

## 2020-09-18 NOTE — Assessment & Plan Note (Addendum)
Well-controlled on lisinopril 40 mg.  Also on bisoprolol 15 mg daily.  He stopped taking his amlodipine.  Blood pressure in office 130/74.  Orthostatic hypotension symptoms have significantly improved, now only having symptoms about once a week when standing.  We will continue the current medications. - consider ambulatory BP monitoring if having more orthostatic symptoms

## 2020-09-18 NOTE — Assessment & Plan Note (Addendum)
Appears to be well controlled.  Patient's desire to wean off of SNRI is mainly driven by cost.  Preferably should remain on SNRI, likely not a significant contributor to dizziness.  We will switch from duloxetine to lower dose venlafaxine.  Follow-up in 1 month, can uptitrate if needed.  Offered counseling resources but patient declined.

## 2020-09-18 NOTE — Patient Instructions (Addendum)
It was nice seeing you today!  Blood pressure looks great, continue your current medications.  Start taking venlafaxine for mood instead of duloxetine.  Follow-up in 1 month to make sure you are doing fine with the medication.  Please arrive at least 15 minutes prior to your scheduled appointments.  Stay well, Zola Button, MD [place] [phone]

## 2020-09-19 ENCOUNTER — Encounter: Payer: Medicare HMO | Admitting: Physical Therapy

## 2020-09-25 ENCOUNTER — Ambulatory Visit: Payer: Medicare HMO

## 2020-09-30 DIAGNOSIS — I252 Old myocardial infarction: Secondary | ICD-10-CM | POA: Diagnosis not present

## 2020-09-30 DIAGNOSIS — Z008 Encounter for other general examination: Secondary | ICD-10-CM | POA: Diagnosis not present

## 2020-09-30 DIAGNOSIS — E785 Hyperlipidemia, unspecified: Secondary | ICD-10-CM | POA: Diagnosis not present

## 2020-09-30 DIAGNOSIS — I69354 Hemiplegia and hemiparesis following cerebral infarction affecting left non-dominant side: Secondary | ICD-10-CM | POA: Diagnosis not present

## 2020-09-30 DIAGNOSIS — K219 Gastro-esophageal reflux disease without esophagitis: Secondary | ICD-10-CM | POA: Diagnosis not present

## 2020-09-30 DIAGNOSIS — I4891 Unspecified atrial fibrillation: Secondary | ICD-10-CM | POA: Diagnosis not present

## 2020-09-30 DIAGNOSIS — D6869 Other thrombophilia: Secondary | ICD-10-CM | POA: Diagnosis not present

## 2020-09-30 DIAGNOSIS — G8929 Other chronic pain: Secondary | ICD-10-CM | POA: Diagnosis not present

## 2020-09-30 DIAGNOSIS — R69 Illness, unspecified: Secondary | ICD-10-CM | POA: Diagnosis not present

## 2020-09-30 DIAGNOSIS — I2510 Atherosclerotic heart disease of native coronary artery without angina pectoris: Secondary | ICD-10-CM | POA: Diagnosis not present

## 2020-09-30 DIAGNOSIS — I10 Essential (primary) hypertension: Secondary | ICD-10-CM | POA: Diagnosis not present

## 2020-10-02 ENCOUNTER — Ambulatory Visit (INDEPENDENT_AMBULATORY_CARE_PROVIDER_SITE_OTHER): Payer: Medicare HMO | Admitting: *Deleted

## 2020-10-02 ENCOUNTER — Other Ambulatory Visit: Payer: Self-pay

## 2020-10-02 DIAGNOSIS — Z7901 Long term (current) use of anticoagulants: Secondary | ICD-10-CM

## 2020-10-02 DIAGNOSIS — R002 Palpitations: Secondary | ICD-10-CM

## 2020-10-02 DIAGNOSIS — Z5181 Encounter for therapeutic drug level monitoring: Secondary | ICD-10-CM

## 2020-10-02 LAB — POCT INR: INR: 3.6 — AB (ref 2.0–3.0)

## 2020-10-02 NOTE — Patient Instructions (Signed)
Description    Hold warfarin today, then continue to take 1 tablet daily except for 1/2 a tablet on Monday, Wednesday and Friday. Recheck INR in 3 weeks. Cou[facility]in [facility] [phone].

## 2020-10-06 NOTE — Progress Notes (Signed)
    SUBJECTIVE:   CHIEF COMPLAINT / HPI: f/u depression and anxiety  Depression and anxiety At last visit duloxetine 40 mg was switched to lower dose venlafaxine 37.5 mg due to insurance change. Declined counseling resources at last visit. Unable to tolerate the venlafaxine due dizziness and made him feel bad overall, only took for 2-3 days. Currently weaning himself off of duloxetine, taking 20 mg daily for the past 3 weeks. Feels mood has been okay.  Bilateral leg pain Bilateral leg pain only at night. Has had two falls since last visit due to balance. Denies dizziness including with standing. Going to PT for his leg pain but does not seem to help his balance, only strength. Has had difficulty sleeping last week due to leg pain. Taking Tylenol as needed but has not provided much relief. Has cut back on alcohol use, had 1 glass of wine last week. Denies numbness in lower extremities. Not worse with activity.  HLD Currently on PCSK9 every 14 days and pravastatin 80 mg daily.  Tolerating medications okay, but patient states that he may be put in the donut hole soon due to the cost of the PCSK9.  He has been getting a medication assistance program with his cardiologist.  Requesting to have his LDL checked today.  PERTINENT  PMH / PSH: CAD, A Fib (on warfarin), COPD, HTN, GERD, CVA(residual deficits left arm, left leg),HLD  OBJECTIVE:   BP 128/70   Pulse (!) 57   Ht 6' (1.829 m)   Wt 187 lb 6.4 oz (85 kg)   SpO2 98%   BMI 25.42 kg/m   General: Elderly male, NAD CV: RRR, no murmurs Pulm: CTAB, no wheezes or rales Neuro: gross sensation intact bilateral lower extremities  ASSESSMENT/PLAN:   Hyperlipidemia Checking LDL per patient request.  Anxiety and depression Unable to tolerate venlafaxine so patient is self weaning off of duloxetine as this is not covered by his insurance anymore.  No concerns with his mood and feels he does not need the duloxetine.  We will continue to  monitor.  Chronic pain of both lower extremities Ongoing for several years, slightly worse now and is having more difficulty with sleep as a result.  At previous visit, vitamin B12, CBC, ferritin, and TSH unremarkable.  Less likely PAD given history.  Will check labs to assess for hypomagnesemia, myopathy, and inflammatory causes. - trial tizanidine qhs prn - labs: ESR, CRP, Mg, CK     Zola Button, MD [place]

## 2020-10-08 NOTE — Patient Instructions (Addendum)
It was nice seeing you today!  Try tizanidine at night before bed as needed. It can help muscle spasms and may help with sleep. Stop if you are having a lot of dizziness.  Labs today.  Follow-up in 2-4 weeks.  Please arrive at least 15 minutes prior to your scheduled appointments.  Stay well, Zola Button, MD [place] [phone]

## 2020-10-09 DIAGNOSIS — H35033 Hypertensive retinopathy, bilateral: Secondary | ICD-10-CM | POA: Diagnosis not present

## 2020-10-09 DIAGNOSIS — H5213 Myopia, bilateral: Secondary | ICD-10-CM | POA: Diagnosis not present

## 2020-10-10 ENCOUNTER — Other Ambulatory Visit: Payer: Self-pay | Admitting: Family Medicine

## 2020-10-16 ENCOUNTER — Ambulatory Visit (INDEPENDENT_AMBULATORY_CARE_PROVIDER_SITE_OTHER): Payer: Medicare HMO | Admitting: Family Medicine

## 2020-10-16 ENCOUNTER — Other Ambulatory Visit: Payer: Self-pay

## 2020-10-16 ENCOUNTER — Encounter: Payer: Self-pay | Admitting: Family Medicine

## 2020-10-16 VITALS — BP 128/70 | HR 57 | Ht 72.0 in | Wt 187.4 lb

## 2020-10-16 DIAGNOSIS — E782 Mixed hyperlipidemia: Secondary | ICD-10-CM | POA: Diagnosis not present

## 2020-10-16 DIAGNOSIS — M79605 Pain in left leg: Secondary | ICD-10-CM

## 2020-10-16 DIAGNOSIS — M79604 Pain in right leg: Secondary | ICD-10-CM | POA: Diagnosis not present

## 2020-10-16 DIAGNOSIS — F419 Anxiety disorder, unspecified: Secondary | ICD-10-CM | POA: Diagnosis not present

## 2020-10-16 DIAGNOSIS — R69 Illness, unspecified: Secondary | ICD-10-CM | POA: Diagnosis not present

## 2020-10-16 DIAGNOSIS — G8929 Other chronic pain: Secondary | ICD-10-CM

## 2020-10-16 DIAGNOSIS — F32A Depression, unspecified: Secondary | ICD-10-CM

## 2020-10-16 MED ORDER — TIZANIDINE HCL 4 MG PO TABS: 4.0000 mg | ORAL_TABLET | Freq: Every evening | ORAL | 0 refills | Status: DC | PRN

## 2020-10-16 NOTE — Assessment & Plan Note (Signed)
Checking LDL per patient request.

## 2020-10-16 NOTE — Assessment & Plan Note (Addendum)
Ongoing for several years, slightly worse now and is having more difficulty with sleep as a result.  At previous visit, vitamin B12, CBC, ferritin, and TSH unremarkable.  Less likely PAD given history.  Will check labs to assess for hypomagnesemia, myopathy, and inflammatory causes. - trial tizanidine qhs prn - labs: ESR, CRP, Mg, CK

## 2020-10-16 NOTE — Assessment & Plan Note (Signed)
Unable to tolerate venlafaxine so patient is self weaning off of duloxetine as this is not covered by his insurance anymore.  No concerns with his mood and feels he does not need the duloxetine.  We will continue to monitor.

## 2020-10-17 LAB — SEDIMENTATION RATE: Sed Rate: 9 mm/hr (ref 0–30)

## 2020-10-17 LAB — MAGNESIUM: Magnesium: 2 mg/dL (ref 1.6–2.3)

## 2020-10-17 LAB — C-REACTIVE PROTEIN: CRP: 1 mg/L (ref 0–10)

## 2020-10-17 LAB — LDL CHOLESTEROL, DIRECT: LDL Direct: 64 mg/dL (ref 0–99)

## 2020-10-17 LAB — CK: Total CK: 68 U/L (ref 41–331)

## 2020-10-22 ENCOUNTER — Ambulatory Visit: Payer: Medicare HMO | Admitting: Interventional Cardiology

## 2020-10-22 ENCOUNTER — Other Ambulatory Visit: Payer: Self-pay

## 2020-10-22 ENCOUNTER — Encounter: Payer: Self-pay | Admitting: Interventional Cardiology

## 2020-10-22 ENCOUNTER — Ambulatory Visit (INDEPENDENT_AMBULATORY_CARE_PROVIDER_SITE_OTHER): Payer: Medicare HMO | Admitting: *Deleted

## 2020-10-22 VITALS — BP 128/78 | HR 63 | Ht 72.0 in | Wt 187.2 lb

## 2020-10-22 DIAGNOSIS — I10 Essential (primary) hypertension: Secondary | ICD-10-CM | POA: Diagnosis not present

## 2020-10-22 DIAGNOSIS — R002 Palpitations: Secondary | ICD-10-CM | POA: Diagnosis not present

## 2020-10-22 DIAGNOSIS — Z7901 Long term (current) use of anticoagulants: Secondary | ICD-10-CM | POA: Diagnosis not present

## 2020-10-22 DIAGNOSIS — I480 Paroxysmal atrial fibrillation: Secondary | ICD-10-CM | POA: Diagnosis not present

## 2020-10-22 DIAGNOSIS — Z5181 Encounter for therapeutic drug level monitoring: Secondary | ICD-10-CM | POA: Diagnosis not present

## 2020-10-22 DIAGNOSIS — I25118 Atherosclerotic heart disease of native coronary artery with other forms of angina pectoris: Secondary | ICD-10-CM | POA: Diagnosis not present

## 2020-10-22 DIAGNOSIS — I5032 Chronic diastolic (congestive) heart failure: Secondary | ICD-10-CM

## 2020-10-22 DIAGNOSIS — E785 Hyperlipidemia, unspecified: Secondary | ICD-10-CM

## 2020-10-22 LAB — POCT INR: INR: 4.5 — AB (ref 2.0–3.0)

## 2020-10-22 NOTE — Patient Instructions (Signed)
Description    Hold warfarin today and tomorrow, then START taking warfarin 1/2 tablet daily except for 1 tablet on Sunday, Tuesday and Thursdays.  Recheck INR in 2 weeks. [facility]in Clinic [phone].

## 2020-10-22 NOTE — Patient Instructions (Addendum)
Medication Instructions:  Your physician recommends that you continue on your current medications as directed. Please refer to the Current Medication list given to you today.  *If you need a refill on your cardiac medications before your next appointment, please call your pharmacy*   Lab Work: none If you have labs (blood work) drawn today and your tests are completely normal, you will receive your results only by: Marland Kitchen MyChart Message (if you have MyChart) OR . A paper copy in the mail If you have any lab test that is abnormal or we need to change your treatment, we will call you to review the results.   Testing/Procedures: none   Follow-Up: At [facility], you and your health needs are our priority.  As part of our continuing mission to provide you with exceptional heart care, we have created designated Provider Care Teams.  These Care Teams include your primary Cardiologist (physician) and Advanced Practice Providers (APPs -  Physician Assistants and Nurse Practitioners) who all work together to provide you with the care you need, when you need it.  We recommend signing up for the patient portal called "MyChart".  Sign up information is provided on this After Visit Summary.  MyChart is used to connect with patients for Virtual Visits (Telemedicine).  Patients are able to view lab/test results, encounter notes, upcoming appointments, etc.  Non-urgent messages can be sent to your provider as well.   To learn more about what you can do with MyChart, go to [email or web address].    Your next appointment:   6 month(s)  The format for your next appointment:   In Person  Provider:   You will see Dr Irish Lack or one of the following Advanced Practice Providers on your designated Care Team:    Melina Copa, PA-C  Ermalinda Barrios, PA-C     Other Instructions Monitor BP at home.  Send readings in if you are getting high readings

## 2020-10-22 NOTE — Progress Notes (Signed)
Cardiology Office Note   Date:  10/22/2020   ID:  [name], DOB [birth year], MRN [id number]  PCP:  Zola Button, MD    No chief complaint on file.  CAD  Wt Readings from Last 3 Encounters:  10/22/20 187 lb 3.2 oz (84.9 kg)  10/16/20 187 lb 6.4 oz (85 kg)  09/18/20 189 lb 3.2 oz (85.8 kg)       History of Present Illness: [name] is a 75 y.o. male  Who saw Dr. Rockey Situ for chest pain.  Prior records show: " past medical history of Chronic alcohol abuse,wine per night 3 to 5+ per night PAF,  prior stroke treated with TPA with mild residual left sided weakness,  Depression with chronic fatigue,  chronic anticoagulation therapy with Coumadin  h/o CAD s/p PCI to the LCx in 2007.  mini stroke in 2015 seen on PET scan, did not have symptoms and was on Coumadin at the time. We previously ordered stress test for chest pain, he did not complete this."  "In the hospital December 2021 with bronchitis, RSV COPD exacerbation Still feels weak today, slowly recovering  Discussed prior cardiac catheterization with him Reported unstable angina symptoms August 2021 In the hospital August 2021 heart catheterization Nonobstructive coronary disease, Right heart pressures were normal"  Has taken Amiodarone to maintain.  AFib episodes are rare.   Denies : Chest pain. Dizziness.  Nitroglycerin use. Orthopnea. Palpitations. Paroxysmal nocturnal dyspnea. Shortness of breath. Syncope.   Rare leg edema.  He reports weakness in legs and poor balance. He has had a few falls.  Has a few bruises.  He reports some leg pains at night. PCP gave a muscle relaxant.   Past Medical History:  Diagnosis Date  . BPH (benign prostatic hyperplasia)   . COPD (chronic obstructive pulmonary disease) ([place]) 2021  . Coronary artery disease    a. s/p PCI to LCx in 2007; b. MV 2017 no sig ischemia, EF 59%, nl study  . Depression   . GERD (gastroesophageal reflux disease)   . Hyperlipidemia    . Hypertension   . IBS (irritable bowel syndrome)   . Myocardial infarction ([place])    12-13 yrs ago   . PAF (paroxysmal atrial fibrillation) ([place])    a. CHADS2VASc => 5 (HTN, age x 1, stroke x 2, vascular disease); b. on Coumadin   . PNA (pneumonia)   . Skin cancer    face   . Sleep apnea    off cpap   . Stroke [facility])    a. x 2 with residual left-sided weakness    Past Surgical History:  Procedure Laterality Date  . ADENOIDECTOMY  1951  . CARDIAC CATHETERIZATION  2006   X1 STENT  . COLONOSCOPY    . CORONARY ANGIOPLASTY    . POLYPECTOMY    . RIGHT/LEFT HEART CATH AND CORONARY ANGIOGRAPHY N/A 01/25/2020   Procedure: RIGHT/LEFT HEART CATH AND CORONARY ANGIOGRAPHY;  Surgeon: Minna Merritts, MD;  Location: [facility];  Service: Cardiovascular;  Laterality: N/A;  . SKIN CANCER EXCISION N/A 08/2016   [place].  [facility] (Dr. Deliah Boston)  . THYROIDECTOMY, PARTIAL    . UPPER GASTROINTESTINAL ENDOSCOPY    . VASCULAR SURGERY     varicose vein stripping left leg     Current Outpatient Medications  Medication Sig Dispense Refill  . acetaminophen (TYLENOL) 500 MG tablet Take 1,000 mg by mouth every 6 (six) hours as needed for mild pain.    Marland Kitchen  Alirocumab (PRALUENT) 75 MG/ML SOAJ Inject 75 mg into the skin every 14 (fourteen) days. 2 mL 11  . amiodarone (PACERONE) 200 MG tablet Take 1 tablet (200 mg total) by mouth daily. 90 tablet 0  . aspirin 81 MG tablet Take 81 mg by mouth at bedtime.     . bisoprolol (ZEBETA) 10 MG tablet Take 1.5 tablets (15 mg total) by mouth daily. 180 tablet 1  . Cholecalciferol (VITAMIN D3) 50 MCG (2000 UT) TABS Take 2,000 Units by mouth at bedtime.     . fluticasone (FLONASE) 50 MCG/ACT nasal spray Place into both nostrils.    . hydrocortisone 2.5 % cream Apply topically daily as needed.    Marland Kitchen lisinopril (ZESTRIL) 40 MG tablet TAKE 1 TABLET (40 MG TOTAL) BY MOUTH DAILY. FOR BLOOD PRESSURE. 90 tablet 1  . Multiple Vitamins-Minerals  (MULTIVITAMIN WITH MINERALS) tablet Take 1 tablet by mouth daily. Centrum Silver    . nitroGLYCERIN (NITROSTAT) 0.4 MG SL tablet Place 1 tablet (0.4 mg total) every 5 (five) minutes as needed under the tongue for chest pain. 25 tablet 3  . omeprazole (PRILOSEC) 40 MG capsule TAKE 1 CAPSULE (40 MG TOTAL) BY MOUTH 2 (TWO) TIMES DAILY BEFORE A MEAL. FOR HEARTBURN. 180 capsule 1  . pravastatin (PRAVACHOL) 80 MG tablet Take 1 tablet (80 mg total) by mouth daily. For cholesterol.    Marland Kitchen tiZANidine (ZANAFLEX) 4 MG tablet Take 1 tablet (4 mg total) by mouth at bedtime as needed for muscle spasms. 30 tablet 0  . warfarin (COUMADIN) 7.5 MG tablet Take 0.5-1 tablets (3.75-7.5 mg total) by mouth See admin instructions. Take 7.5mg  on sundays, all other days take 3.75mg  or as directed by coumadin clinic. (Patient taking differently: Take 3.75-7.5 mg by mouth See admin instructions. Take 3.75mg  on Monday,Wednesday and Fridays, 7.5 mg all the other days.) 108 tablet 1   No current facility-administered medications for this visit.   Facility-Administered Medications Ordered in Other Visits  Medication Dose Route Frequency Provider Last Rate Last Admin  . sodium chloride flush (NS) 0.9 % injection 3 mL  3 mL Intravenous Q12H Visser, Jacquelyn D, PA-C        Allergies:   Bactrim [sulfamethoxazole-trimethoprim], Ciprofloxacin, and Crestor [rosuvastatin calcium]    Social History:  The patient  reports that he has quit smoking. His smoking use included cigarettes. He quit after 20.00 years of use. He has never used smokeless tobacco. He reports current alcohol use of about 2.0 - 3.0 standard drinks of alcohol per week. He reports that he does not use drugs.   Family History:  The patient's family history includes Arthritis in his mother; Cancer in his maternal aunt and mother; Diabetes in his father; Heart Problems in his father; Heart disease in his father, paternal grandfather, and paternal grandmother; Hypertension  in his father; Mental illness in his mother; Stroke in his maternal grandmother and paternal uncle; Sudden death in his maternal grandfather.    ROS:  Please see the history of present illness.   Otherwise, review of systems are positive for poor balance.   All other systems are reviewed and negative.    PHYSICAL EXAM: VS:  BP 128/78   Pulse 63   Ht 6' (1.829 m)   Wt 187 lb 3.2 oz (84.9 kg)   SpO2 94%   BMI 25.39 kg/m  , BMI Body mass index is 25.39 kg/m. GEN: Well nourished, well developed, in no acute distress  HEENT: normal  Neck: no JVD, carotid  bruits, or masses Cardiac: RRR; no murmurs, rubs, or gallops,no edema  Respiratory:  clear to auscultation bilaterally, normal work of breathing GI: soft, nontender, nondistended, + BS MS: no deformity or atrophy  Skin: warm and dry, no rash Neuro:  Strength and sensation are intact Psych: euthymic mood, full affect   EKG:   The ekg ordered today demonstrates NSR, septal Q waves   Recent Labs: 05/08/2020: B Natriuretic Peptide 178.9 08/16/2020: TSH 2.080 08/21/2020: ALT 20; BUN 11; Creatinine, Ser 0.80; Hemoglobin 14.6; Platelets 208; Potassium 3.7; Sodium 143 10/16/2020: Magnesium 2.0   Lipid Panel    Component Value Date/Time   CHOL 227 (H) 03/14/2020 0924   TRIG 250 (H) 03/14/2020 0924   HDL 46 03/14/2020 0924   CHOLHDL 4.9 03/14/2020 0924   VLDL 50 (H) 03/14/2020 0924   LDLCALC 131 (H) 03/14/2020 0924   LDLCALC 67 07/14/2019 1439   LDLDIRECT 64 10/16/2020 1530     Other studies Reviewed: Additional studies/ records that were reviewed today with results demonstrating: Hospital records reviewed.  LDL 85 in 2022   ASSESSMENT AND PLAN:  1. CAD: No angina. Continue aggressive secondary prevention.  2. PAF: In NSR.  Needs TSH and LFTS , CXR regularly for chronic medication management. 3. Anticoagulated: Coumadin for stroke prevention 4. Falls: Balance has become a problem. Continue PT.  I asked him to check with his  PMD for further w/u, checking for vitamin deficiencies that may contribute. 5. Hyperlipidemia: Continue praluent. 6. HTN: Continue lisinopril.  Amlodipine was stopped.  BP controlled today.  Continue monitor.  May need to consider adding back amlodipine 2.5 mg daily if his blood pressures spike on a frequent basis.  He will check his blood pressures and let us know.   Current medicines are reviewed at length with the patient today.  The patient concerns regarding his medicines were addressed.  The following changes have been made:  No change  Labs/ tests ordered today include:  No orders of the defined types were placed in this encounter.   Recommend 150 minutes/week of aerobic exercise Low fat, low carb, high fiber diet recommended  Disposition:   FU in 1 year   Signed, Larae Grooms, MD  10/22/2020 4:26 PM    [facility] [address], [place], [place]  [address] Phone: [phone]; Fax: [phone]

## 2020-10-23 ENCOUNTER — Ambulatory Visit: Payer: Medicare HMO | Admitting: Physical Therapy

## 2020-11-05 ENCOUNTER — Ambulatory Visit: Payer: Medicare HMO

## 2020-11-05 ENCOUNTER — Other Ambulatory Visit: Payer: Self-pay

## 2020-11-05 DIAGNOSIS — Z7901 Long term (current) use of anticoagulants: Secondary | ICD-10-CM

## 2020-11-05 DIAGNOSIS — Z5181 Encounter for therapeutic drug level monitoring: Secondary | ICD-10-CM

## 2020-11-05 DIAGNOSIS — R002 Palpitations: Secondary | ICD-10-CM | POA: Diagnosis not present

## 2020-11-05 LAB — POCT INR: INR: 3.5 — AB (ref 2.0–3.0)

## 2020-11-05 NOTE — Patient Instructions (Signed)
-   Hold warfarin today, then  - continue taking warfarin 1/2 tablet daily except for 1 tablet on Sunday, Tuesday and Thursdays.   - Recheck INR in 3 weeks.  [facility] [phone].

## 2020-11-07 ENCOUNTER — Other Ambulatory Visit: Payer: Self-pay | Admitting: Family Medicine

## 2020-11-12 NOTE — Progress Notes (Signed)
Subjective:   Patient ID: [name]    DOB: Jun 02, 1946, 75 y.o. male   MRN: [id number]  [name] is a 75 y.o. male with a history of paroxysmal afib, CAD, HTN, unstable angina, COPD, OSA, GERD, OSA, left sided weakness, eczema, statin myopathy, testicular atrophy, chronic fatigue, chronic pain of b/l LE, constipation, DOE, ED, falls, h/o CVA, HLD, orthostatic hypotension, orthostatic dizziness, palpitations, tremor here for leg pain and frequent falls  Frequent Falls: Patient presents today for follow up of continued frequent falls. He has had 5 falls since May, last fall was this morning. He notes that his legs give out on him and feels weak in his legs when he walks. He notes that he hit his head on the floor yesterday and cut his nose and now has a black eye. Denies LOC. Family feel that he continues to get weaker. Denies any predisposing chest pain, SOB, dizziness, lightheadedness. Denies any current headache.  He was seen by PCP on 5/11 for similar complaint. He has long history of bilateral leg pain. CPR/ESR, CK, mag, CMP, CBC, B12, ferritin, TSH all WNL. He has attended PT, looks like last appointment was end of March. Looks like he develops dizziness with prolonged ambulation and change in positions. Due ot cost can only attend PT 1x every 3 weeks. PT was recommending continued therapy for improvement. He notes that he does the home exercises 4-5 times a week. He notes that he ambulates with at a cane at baseline.  He has a history of CVA with residual left sided weakness. Notes worsening right sided leg weakness x 1 month. He feels like he falls toward the right when he stands lately. He does have a baseline action tremor bilaterally, with left worse than right.  Current medications include Warfarin, tizanidine, lisinopril, bisoprolol, ASA, amiodarone, Alirocumab (high cholesterol med)  Alcohol: Notes occasional drink   History of chronic alcohol abuse with multiple alcoholic  drinks per night.  Illicit drugs: none Vision: just saw eye doctor and notes good report   Prior Imaging: MRI brain 08/21/20 IMPRESSION: 1. No acute intracranial abnormality. 2. Mild-to-moderate chronic small vessel ischemic disease.  Cardiac Cath (01/25/20): Impression:  Prox Cx to Mid Cx lesion is 10% stenosed.  The left ventricular systolic function is normal.  LV end diastolic pressure is normal.  The left ventricular ejection fraction is 55-65% by visual estimate.  There is no mitral valve regurgitation.  Final Conclusions:   Nonobstructive coronary disease Etiology of his vague symptoms is unclear Unable to exclude paroxysmal atrial fibrillation episodes If symptoms persist we will order a Zio monitor  Recommendations:  Continue current medical management, aggressive cholesterol modification He would likely benefit from lifestyle modification, walking program given his deconditioning Suggest alcohol cessation as it has been mentioned previously to him Also need to address his underlying depression/anxiety  Echo: (10/05/19) IMPRESSIONS  1. Left ventricular ejection fraction, by estimation, is 60 to 65%. The  left ventricle has normal function. The left ventricle has no regional  wall motion abnormalities. Left ventricular diastolic parameters are  consistent with Grade I diastolic  dysfunction (impaired relaxation).  2. Right ventricular systolic function is normal. The right ventricular  size is normal. There is mildly elevated pulmonary artery systolic  pressure. The estimated right ventricular systolic pressure is 53.6 mmHg.  3. Left atrial size was mildly dilated.  4. Right atrial size was mildly dilated.   EKG: 08/22/20: NSR, HR 61, no afib  Lumbar XR (07/16/17):  IMPRESSION:  No acute abnormality of the lumbar spine. Mild degenerative change for age.  Review of Systems:  Per HPI.   Objective:   BP (!) 153/95   Pulse (!) 54   Ht 6' (1.829 m)   Wt  181 lb 6.4 oz (82.3 kg)   SpO2 94%   BMI 24.60 kg/m  Vitals and nursing note reviewed.  General: pleasant older gentleman, sitting comfortably in [facility] wheel chair, well nourished, well developed, in no acute distress with non-toxic appearance HEENT: ecchymosis/small hematoma around right eye, moist mucous membranes, oropharynx clear without erythema or exudate, EOMI without pain, PERRL Resp: breathing comfortably on room air, speaking in full sentences Skin: warm, dry, superficial abrasion to left lateral knee Extremities: warm and well perfused, normal tone MSK: 3/5 strength in R slightly greater than L LE bilaterally, quad strength is worse than remaining LE extremity strength, slow get up and go test with short steps in gait, required UE assistance to stand from seated position,  Neuro: Alert and oriented, speech normal, normal sensation on UE and LE bilaterally, normal romberg, negative pronator drift, finger to nose slightly abnormal in left with tremor at end of action, normal on right He does have a baseline action tremor bilaterally, with left worse than right on exam today  Assessment & Plan:   Frequent falls Patient presents today for frequent falls, last fall this morning. Fall yesterday with head trauma and subsequent periorbital hematoma while on Warfarin. Neuro exam unremarkable today except for significant LE weakness, R>L. Appears RLE weakness has worsened over the last month. Patient is at high risk for falls given his PMH of CVA with residual left sided weakness and LE pain. He also has history of orthostatic hypotension and dizziness noted during my last exam. He has a history of PAF currently rate controlled. Prior brain MRI with chronic ischemic changes. Recent echo and heart cath unremarkable. He is on chronic anticoagulation which places him a high risk for bleed in setting of these falls. Given his history of head trauma on warfarin, I feel STAT head imaging is warranted and  would be best handled in the emergency department setting. I do not feel he is safe at home given number of falls. He would benefit from PT, rolling walker, and bedside commode before returning home. Patient was amendable to ED evaluation. Charge nurse has been notified.   Patient had elevated PHQ-9 score with positive answer #9. This was not able to be addressed due to acute concerns. I would recommend discussing with patient during evaluation. I will also attempt to reach out to patient to discuss.   No orders of the defined types were placed in this encounter.  No orders of the defined types were placed in this encounter.     Mina Marble, DO PGY-3, [facility] 11/13/2020 11:43 AM

## 2020-11-13 ENCOUNTER — Other Ambulatory Visit: Payer: Self-pay

## 2020-11-13 ENCOUNTER — Emergency Department (HOSPITAL_COMMUNITY): Payer: Medicare HMO

## 2020-11-13 ENCOUNTER — Ambulatory Visit (INDEPENDENT_AMBULATORY_CARE_PROVIDER_SITE_OTHER): Payer: Medicare HMO | Admitting: Family Medicine

## 2020-11-13 ENCOUNTER — Encounter (HOSPITAL_COMMUNITY): Payer: Self-pay | Admitting: Emergency Medicine

## 2020-11-13 ENCOUNTER — Emergency Department (HOSPITAL_COMMUNITY)
Admission: EM | Admit: 2020-11-13 | Discharge: 2020-11-13 | Disposition: A | Discharge status: Home / Self Care | Payer: Medicare HMO | Attending: Emergency Medicine | Admitting: Emergency Medicine

## 2020-11-13 ENCOUNTER — Telehealth: Payer: Self-pay | Admitting: *Deleted

## 2020-11-13 VITALS — BP 153/95 | HR 54 | Ht 72.0 in | Wt 181.4 lb

## 2020-11-13 DIAGNOSIS — G4733 Obstructive sleep apnea (adult) (pediatric): Secondary | ICD-10-CM | POA: Diagnosis not present

## 2020-11-13 DIAGNOSIS — Z79899 Other long term (current) drug therapy: Secondary | ICD-10-CM | POA: Insufficient documentation

## 2020-11-13 DIAGNOSIS — Z7901 Long term (current) use of anticoagulants: Secondary | ICD-10-CM

## 2020-11-13 DIAGNOSIS — Z955 Presence of coronary angioplasty implant and graft: Secondary | ICD-10-CM | POA: Insufficient documentation

## 2020-11-13 DIAGNOSIS — I6782 Cerebral ischemia: Secondary | ICD-10-CM | POA: Diagnosis not present

## 2020-11-13 DIAGNOSIS — I2510 Atherosclerotic heart disease of native coronary artery without angina pectoris: Secondary | ICD-10-CM | POA: Insufficient documentation

## 2020-11-13 DIAGNOSIS — R531 Weakness: Secondary | ICD-10-CM | POA: Diagnosis not present

## 2020-11-13 DIAGNOSIS — S0990XA Unspecified injury of head, initial encounter: Secondary | ICD-10-CM | POA: Insufficient documentation

## 2020-11-13 DIAGNOSIS — Z87891 Personal history of nicotine dependence: Secondary | ICD-10-CM | POA: Diagnosis not present

## 2020-11-13 DIAGNOSIS — Z7951 Long term (current) use of inhaled steroids: Secondary | ICD-10-CM | POA: Insufficient documentation

## 2020-11-13 DIAGNOSIS — S0011XA Contusion of right eyelid and periocular area, initial encounter: Secondary | ICD-10-CM | POA: Diagnosis not present

## 2020-11-13 DIAGNOSIS — Z7982 Long term (current) use of aspirin: Secondary | ICD-10-CM | POA: Insufficient documentation

## 2020-11-13 DIAGNOSIS — M545 Low back pain, unspecified: Secondary | ICD-10-CM | POA: Diagnosis not present

## 2020-11-13 DIAGNOSIS — J441 Chronic obstructive pulmonary disease with (acute) exacerbation: Secondary | ICD-10-CM | POA: Diagnosis not present

## 2020-11-13 DIAGNOSIS — R296 Repeated falls: Secondary | ICD-10-CM | POA: Insufficient documentation

## 2020-11-13 DIAGNOSIS — R001 Bradycardia, unspecified: Secondary | ICD-10-CM | POA: Diagnosis not present

## 2020-11-13 DIAGNOSIS — Z20822 Contact with and (suspected) exposure to covid-19: Secondary | ICD-10-CM | POA: Diagnosis not present

## 2020-11-13 DIAGNOSIS — W01198A Fall on same level from slipping, tripping and stumbling with subsequent striking against other object, initial encounter: Secondary | ICD-10-CM | POA: Insufficient documentation

## 2020-11-13 DIAGNOSIS — M40209 Unspecified kyphosis, site unspecified: Secondary | ICD-10-CM | POA: Diagnosis not present

## 2020-11-13 DIAGNOSIS — Y92009 Unspecified place in unspecified non-institutional (private) residence as the place of occurrence of the external cause: Secondary | ICD-10-CM | POA: Diagnosis not present

## 2020-11-13 DIAGNOSIS — I10 Essential (primary) hypertension: Secondary | ICD-10-CM | POA: Diagnosis not present

## 2020-11-13 DIAGNOSIS — Z85828 Personal history of other malignant neoplasm of skin: Secondary | ICD-10-CM | POA: Diagnosis not present

## 2020-11-13 DIAGNOSIS — S0511XA Contusion of eyeball and orbital tissues, right eye, initial encounter: Secondary | ICD-10-CM | POA: Diagnosis not present

## 2020-11-13 DIAGNOSIS — S80212A Abrasion, left knee, initial encounter: Secondary | ICD-10-CM | POA: Insufficient documentation

## 2020-11-13 LAB — COMPREHENSIVE METABOLIC PANEL
ALT: 27 U/L (ref 0–44)
AST: 33 U/L (ref 15–41)
Albumin: 3.8 g/dL (ref 3.5–5.0)
Alkaline Phosphatase: 79 U/L (ref 38–126)
Anion gap: 7 (ref 5–15)
BUN: 9 mg/dL (ref 8–23)
CO2: 30 mmol/L (ref 22–32)
Calcium: 9 mg/dL (ref 8.9–10.3)
Chloride: 102 mmol/L (ref 98–111)
Creatinine, Ser: 1.07 mg/dL (ref 0.61–1.24)
GFR, Estimated: >60 mL/min (ref >60)
Glucose, Bld: 97 mg/dL (ref 70–99)
Potassium: 3.9 mmol/L (ref 3.5–5.1)
Sodium: 139 mmol/L (ref 135–145)
Total Bilirubin: 0.7 mg/dL (ref 0.3–1.2)
Total Protein: 6.9 g/dL (ref 6.5–8.1)

## 2020-11-13 LAB — RESP PANEL BY RT-PCR (FLU A&B, COVID) ARPGX2
Influenza A by PCR: NEGATIVE
Influenza B by PCR: NEGATIVE
SARS Coronavirus 2 by RT PCR: NEGATIVE

## 2020-11-13 LAB — CBC
HCT: 45 % (ref 39.0–52.0)
Hemoglobin: 14.7 g/dL (ref 13.0–17.0)
MCH: 30.6 pg (ref 26.0–34.0)
MCHC: 32.7 g/dL (ref 30.0–36.0)
MCV: 93.6 fL (ref 80.0–100.0)
Platelets: 231 10*3/uL (ref 150–400)
RBC: 4.81 MIL/uL (ref 4.22–5.81)
RDW: 14.4 % (ref 11.5–15.5)
WBC: 7.6 10*3/uL (ref 4.0–10.5)
nRBC: 0 % (ref 0.0–0.2)

## 2020-11-13 LAB — URINALYSIS, ROUTINE W REFLEX MICROSCOPIC
Bilirubin Urine: NEGATIVE
Glucose, UA: NEGATIVE mg/dL
Hgb urine dipstick: NEGATIVE
Ketones, ur: NEGATIVE mg/dL
Leukocytes,Ua: NEGATIVE
Nitrite: NEGATIVE
Protein, ur: NEGATIVE mg/dL
Specific Gravity, Urine: 1.011 (ref 1.005–1.030)
pH: 6 (ref 5.0–8.0)

## 2020-11-13 LAB — I-STAT CHEM 8, ED
BUN: 9 mg/dL (ref 8–23)
Calcium, Ion: 1.21 mmol/L (ref 1.15–1.40)
Chloride: 101 mmol/L (ref 98–111)
Creatinine, Ser: 1.1 mg/dL (ref 0.61–1.24)
Glucose, Bld: 96 mg/dL (ref 70–99)
HCT: 45 % (ref 39.0–52.0)
Hemoglobin: 15.3 g/dL (ref 13.0–17.0)
Potassium: 3.9 mmol/L (ref 3.5–5.1)
Sodium: 141 mmol/L (ref 135–145)
TCO2: 31 mmol/L (ref 22–32)

## 2020-11-13 LAB — SAMPLE TO BLOOD BANK

## 2020-11-13 LAB — PROTIME-INR
INR: 2.9 — ABNORMAL HIGH (ref 0.8–1.2)
Prothrombin Time: 29.9 seconds — ABNORMAL HIGH (ref 11.4–15.2)

## 2020-11-13 LAB — TROPONIN I (HIGH SENSITIVITY): Troponin I (High Sensitivity): 8 ng/L (ref <18)

## 2020-11-13 MED ORDER — ACETAMINOPHEN 500 MG PO TABS
1000.0000 mg | ORAL_TABLET | Freq: Once | ORAL | Status: AC
Start: 2020-11-13 — End: 2020-11-13
  Administered 2020-11-13: 1000 mg via ORAL
  Filled 2020-11-13: qty 2

## 2020-11-13 MED ORDER — SODIUM CHLORIDE 0.9 % IV BOLUS
1000.0000 mL | Freq: Once | INTRAVENOUS | Status: AC
  Administered 2020-11-13: 1000 mL via INTRAVENOUS

## 2020-11-13 MED ORDER — GADOBUTROL 1 MMOL/ML IV SOLN: 8.5000 mL | Freq: Once | INTRAVENOUS | Status: DC | PRN

## 2020-11-13 MED ORDER — GADOBUTROL 1 MMOL/ML IV SOLN
8.5000 mL | Freq: Once | INTRAVENOUS | Status: AC | PRN
  Administered 2020-11-13: 8.5 mL via INTRAVENOUS

## 2020-11-13 MED ORDER — HYDRALAZINE HCL 20 MG/ML IJ SOLN
20.0000 mg | Freq: Once | INTRAMUSCULAR | Status: AC
  Administered 2020-11-13: 20 mg via INTRAVENOUS
  Filled 2020-11-13: qty 1

## 2020-11-13 NOTE — ED Triage Notes (Signed)
Pt arrives POV for fall this morning. Pt reports increased weakness, states he was walking to the bathroom and lost balance, fell and hit head on tile floor. Pt has bruise to R eye. Pt is on warfarin for hx of stroke and MI. AOx4, denies pain, just feels weak.

## 2020-11-13 NOTE — ED Notes (Signed)
Patient transported to MRI 

## 2020-11-13 NOTE — ED Notes (Signed)
Pt returned from CT °

## 2020-11-13 NOTE — Assessment & Plan Note (Signed)
Patient presents today for frequent falls, last fall this morning. Fall yesterday with head trauma and subsequent periorbital hematoma while on Warfarin. Neuro exam unremarkable today except for significant LE weakness, R>L. Appears RLE weakness has worsened over the last month. Patient is at high risk for falls given his PMH of CVA with residual left sided weakness and LE pain. He also has history of orthostatic hypotension and dizziness noted during my last exam. He has a history of PAF currently rate controlled. Prior brain MRI with chronic ischemic changes. Recent echo and heart cath unremarkable. He is on chronic anticoagulation which places him a high risk for bleed in setting of these falls. Given his history of head trauma on warfarin, I feel STAT head imaging is warranted and would be best handled in the emergency department setting. I do not feel he is safe at home given number of falls. He would benefit from PT, rolling walker, and bedside commode before returning home. Patient was amendable to ED evaluation. Charge nurse has been notified.

## 2020-11-13 NOTE — ED Notes (Signed)
Patient transported to CT w/ trauma RN Estill Bamberg.

## 2020-11-13 NOTE — Discharge Instructions (Addendum)
Your workup was overall reassuring in the ED today. Your MRIs did not show any acute findings to account for your leg weakness.   While at home please ice your face to help reduce the swelling. You can take Tylenol as needed for pain.   Use your walker and bedside commode as indicated.   Please follow up with your PCP regarding your ED visit and for further evaluation of your weakness

## 2020-11-13 NOTE — ED Provider Notes (Signed)
[facility]ERGENCY DEPARTMENT Provider Note   CSN: [id number] Arrival date & time: 11/13/20  1146     History Chief Complaint  Patient presents with  . level 2 fall    Densel Kronick is a 75 y.o. male with PMhx HTN, HLD, COPD, CAD, A fib on Xarelto who presents to the ED today for fall/head injury that occurred around 6 AM today. Pt reports he got out of bed when his legs gave out from underneath him causing him to fall and hit his head on the bathroom tile. No LOC. He believes he took his Xarelto prior to the fall. Pt went to his PCP today for the fall/head injury and was sent here for further evaluation with concern for head trauma. Pt reports he has been having ongoing issues with weakness over the past several weeks with multiple falls. He denies chest pain or SOB.   The history is provided by the patient and medical records.       Past Medical History:  Diagnosis Date  . BPH (benign prostatic hyperplasia)   . COPD (chronic obstructive pulmonary disease) ([place]) 2021  . Coronary artery disease    a. s/p PCI to LCx in 2007; b. MV 2017 no sig ischemia, EF 59%, nl study  . Depression   . GERD (gastroesophageal reflux disease)   . Hyperlipidemia   . Hypertension   . IBS (irritable bowel syndrome)   . Myocardial infarction ([place])    12-13 yrs ago   . PAF (paroxysmal atrial fibrillation) ([place])    a. CHADS2VASc => 5 (HTN, age x 1, stroke x 2, vascular disease); b. on Coumadin   . PNA (pneumonia)   . Skin cancer    face   . Sleep apnea    off cpap   . Stroke [facility])    a. x 2 with residual left-sided weakness    Patient Active Problem List   Diagnosis Date Noted  . Frequent falls 11/13/2020  . Orthostatic dizziness 08/28/2020  . Chronic pain of both lower extremities 07/17/2020  . COPD exacerbation ([place]) 05/08/2020  . Unstable angina ([place]) 01/25/2020  . Statin myopathy 06/07/2019  . Dysuria 04/21/2019  . Right elbow pain 12/21/2018  . Left-sided  weakness 08/12/2018  . Chronic obstructive pulmonary disease ([place]) 06/13/2018  . Abdominal pain 05/27/2018  . Wrist pain, acute, right 11/05/2017  . Sleep apnea 09/16/2017  . Fall 07/21/2017  . Constipation 07/21/2017  . Chronic fatigue 06/18/2017  . Tremor 06/18/2017  . Eczema 06/18/2017  . Testicular atrophy 04/23/2017  . Dyspnea on exertion 03/16/2017  . Erectile dysfunction 03/05/2017  . Palpitations 12/09/2015  . Anxiety and depression 02/08/2015  . GERD (gastroesophageal reflux disease) 02/08/2015  . IBS (irritable bowel syndrome) 02/08/2015  . Essential hypertension 02/08/2015  . AF (paroxysmal atrial fibrillation) ([place]) 01/23/2015  . CAD (coronary artery disease) 01/23/2015  . Hyperlipidemia 01/23/2015  . History of CVA (cerebrovascular accident) 01/23/2015    Past Surgical History:  Procedure Laterality Date  . ADENOIDECTOMY  1951  . CARDIAC CATHETERIZATION  2006   X1 STENT  . COLONOSCOPY    . CORONARY ANGIOPLASTY    . POLYPECTOMY    . RIGHT/LEFT HEART CATH AND CORONARY ANGIOGRAPHY N/A 01/25/2020   Procedure: RIGHT/LEFT HEART CATH AND CORONARY ANGIOGRAPHY;  Surgeon: Minna Merritts, MD;  Location: [facility];  Service: Cardiovascular;  Laterality: N/A;  . SKIN CANCER EXCISION N/A 08/2016   [place].  [facility] (Dr. Deliah Boston)  .  THYROIDECTOMY, PARTIAL    . UPPER GASTROINTESTINAL ENDOSCOPY    . VASCULAR SURGERY     varicose vein stripping left leg       Family History  Problem Relation Age of Onset  . Heart Problems Father   . Heart disease Father   . Hypertension Father   . Diabetes Father   . Mental illness Mother   . Arthritis Mother   . Cancer Mother        ovarian  . Stroke Paternal Uncle   . Heart disease Paternal Grandfather   . Cancer Maternal Aunt   . Stroke Maternal Grandmother   . Sudden death Maternal Grandfather   . Heart disease Paternal Grandmother   . Colon cancer Neg Hx   . Esophageal cancer Neg Hx   .  Rectal cancer Neg Hx   . Colon polyps Neg Hx   . Stomach cancer Neg Hx     Social History   Tobacco Use  . Smoking status: Former Smoker    Years: 20.00    Types: Cigarettes  . Smokeless tobacco: Never Used  Vaping Use  . Vaping Use: Never used  Substance Use Topics  . Alcohol use: Yes    Alcohol/week: 2.0 - 3.0 standard drinks    Types: 2 - 3 Glasses of wine per week    Comment: Wine daily with dinner most days  . Drug use: No    Home Medications Prior to Admission medications   Medication Sig Start Date End Date Taking? Authorizing Provider  acetaminophen (TYLENOL) 500 MG tablet Take 1,000 mg by mouth every 6 (six) hours as needed for mild pain.    [provider]  Alirocumab (PRALUENT) 75 MG/ML SOAJ Inject 75 mg into the skin every 14 (fourteen) days. 07/09/20   Minna Merritts, MD  amiodarone (PACERONE) 200 MG tablet Take 1 tablet (200 mg total) by mouth daily. 09/02/20   Minna Merritts, MD  aspirin 81 MG tablet Take 81 mg by mouth at bedtime.     [provider]  bisoprolol (ZEBETA) 10 MG tablet Take 1.5 tablets (15 mg total) by mouth daily. 08/14/20   Zola Button, MD  Cholecalciferol (VITAMIN D3) 50 MCG (2000 UT) TABS Take 2,000 Units by mouth at bedtime.     [provider]  fluticasone (FLONASE) 50 MCG/ACT nasal spray Place into both nostrils. 05/20/20   [provider]  hydrocortisone 2.5 % cream Apply topically daily as needed. 01/02/20   [provider]  lisinopril (ZESTRIL) 40 MG tablet TAKE 1 TABLET (40 MG TOTAL) BY MOUTH DAILY. FOR BLOOD PRESSURE. 09/25/19   Pleas Koch, NP  Multiple Vitamins-Minerals (MULTIVITAMIN WITH MINERALS) tablet Take 1 tablet by mouth daily. Centrum Silver    [provider]  nitroGLYCERIN (NITROSTAT) 0.4 MG SL tablet Place 1 tablet (0.4 mg total) every 5 (five) minutes as needed under the tongue for chest pain. 04/16/17   Leone Haven, MD  omeprazole (PRILOSEC) 40 MG capsule  TAKE 1 CAPSULE (40 MG TOTAL) BY MOUTH 2 (TWO) TIMES DAILY BEFORE A MEAL. FOR HEARTBURN. 07/15/20   Thornton Park, MD  pravastatin (PRAVACHOL) 80 MG tablet Take 1 tablet (80 mg total) by mouth daily. For cholesterol. 03/22/20   Minna Merritts, MD  tiZANidine (ZANAFLEX) 4 MG tablet TAKE 1 TABLET BY MOUTH AT BEDTIME AS NEEDED FOR MUSCLE SPASMS. 11/11/20   Zola Button, MD  warfarin (COUMADIN) 7.5 MG tablet Take 0.5-1 tablets (3.75-7.5 mg total) by mouth  See admin instructions. Take 7.5mg  on sundays, all other days take 3.75mg  or as directed by coumadin clinic. Patient taking differently: Take 3.75-7.5 mg by mouth See admin instructions. Take 3.75mg  on Monday,Wednesday and Fridays, 7.5 mg all the other days. 11/02/18   Pleas Koch, NP    Allergies    Bactrim [sulfamethoxazole-trimethoprim], Ciprofloxacin, and Crestor [rosuvastatin calcium]  Review of Systems   Review of Systems  Constitutional: Negative for chills and fever.  Neurological: Positive for weakness (generalized) and headaches. Negative for syncope.  All other systems reviewed and are negative.   Physical Exam Updated Vital Signs BP (!) 166/95 (BP Location: Left Arm)   Pulse (!) 48   Temp 98.2 F (36.8 C) (Oral)   Resp 16   SpO2 98%   Physical Exam Vitals and nursing note reviewed.  Constitutional:      Appearance: He is not ill-appearing or diaphoretic.  HENT:     Head: Normocephalic.     Comments: Right periorbital ecchymosis and TTP. No hyphema.  Eyes:     Extraocular Movements: Extraocular movements intact.     Conjunctiva/sclera: Conjunctivae normal.     Pupils: Pupils are equal, round, and reactive to light.  Neck:     Comments: No midline C spine TTP Cardiovascular:     Rate and Rhythm: Normal rate and regular rhythm.     Pulses: Normal pulses.  Pulmonary:     Effort: Pulmonary effort is normal.     Breath sounds: Normal breath sounds. No wheezing, rhonchi or rales.  Abdominal:     Palpations:  Abdomen is soft.     Tenderness: There is no abdominal tenderness. There is no guarding or rebound.  Musculoskeletal:     Cervical back: Neck supple. No tenderness.     Comments: Abrasion to L knee without TTP. ROM intact to knee. Negative anterior and posterior drawer test. 2+ DP pulse.   Skin:    General: Skin is warm and dry.  Neurological:     Mental Status: He is alert and oriented to person, place, and time.     Comments: Alert and oriented to self, place, time and event.   Speech is fluent, clear without dysarthria or dysphasia.   Strength 4/5 to RLE compared to 5/5 LLE. Strength equal BUEs.  Sensation intact in upper/lower extremities   No pronator drift.  Normal finger-to-nose and feet tapping.  CN I not tested  CN II grossly intact visual fields bilaterally. Did not visualize posterior eye.   CN III, IV, VI PERRLA and EOMs intact bilaterally  CN V Intact sensation to sharp and light touch to the face  CN VII facial movements symmetric  CN VIII not tested  CN IX, X no uvula deviation, symmetric rise of soft palate  CN XI 5/5 SCM and trapezius strength bilaterally  CN XII Midline tongue protrusion, symmetric L/R movements      ED Results / Procedures / Treatments   Labs (all labs ordered are listed, but only abnormal results are displayed) Labs Reviewed  PROTIME-INR - Abnormal; Notable for the following components:      Result Value   Prothrombin Time 29.9 (*)    INR 2.9 (*)    All other components within normal limits  RESP PANEL BY RT-PCR (FLU A&B, COVID) ARPGX2  COMPREHENSIVE METABOLIC PANEL  CBC  URINALYSIS, ROUTINE W REFLEX MICROSCOPIC  I-STAT CHEM 8, ED  SAMPLE TO BLOOD BANK  TROPONIN I (HIGH SENSITIVITY)  TROPONIN I (HIGH SENSITIVITY)  EKG EKG Interpretation  Date/Time:  Wednesday November 13 2020 13:00:25 EDT Ventricular Rate:  53 PR Interval:  217 QRS Duration: 86 QT Interval:  661 QTC Calculation: 621 R Axis:   88 Text Interpretation: Sinus  bradycardia Nonspecific T wave abnormality Prolonged QT interval Confirmed by Lajean Saver [phone]) on 11/13/2020 1:19:22 PM   Radiology CT HEAD WO CONTRAST  Result Date: 11/13/2020 CLINICAL DATA:  Facial trauma; Head trauma, minor, normal mental status (Age 25-64y), fall, on blood thinners, right eye contusion EXAM: CT HEAD WITHOUT CONTRAST CT MAXILLOFACIAL WITHOUT CONTRAST TECHNIQUE: Multidetector CT imaging of the head and maxillofacial structures were performed using the standard protocol without intravenous contrast. Multiplanar CT image reconstructions of the maxillofacial structures were also generated. COMPARISON:  Head CT 05/08/2020, MRI 08/21/2020 FINDINGS: CT HEAD FINDINGS Brain: No evidence of acute intracranial hemorrhage. There is mild global cerebral atrophy. Unchanged size of the ventricular system. Subcortical and periventricular hypodensities are unchanged for prior exam, nonspecific, but likely represent sequela of chronic small vessel ischemic disease. Vascular: No hyperdense vessel or unexpected calcification. Skull: Normal. Negative for fracture or focal lesion. Other: There is an focal scalp thickening along the high frontal scalp from prior injury. CT MAXILLOFACIAL FINDINGS Osseous: No fracture or mandibular dislocation. No destructive process. Orbits: Negative. No traumatic or inflammatory finding. Sinuses: The paranasal sinuses are predominantly clear. Soft tissues: There is right periorbital soft tissue swelling. Possible small shallow laceration on the nose (axial image 140). IMPRESSION: No acute intracranial abnormality.  No acute facial fracture. Right periorbital soft tissue swelling. Possible small shallow soft tissue laceration on the nose. Unchanged mild cerebral atrophy and sequela of chronic small vessel ischemic disease. Electronically Signed   By: Maurine Simmering   On: 11/13/2020 13:57   MR THORACIC SPINE W WO CONTRAST  Result Date: 11/13/2020 CLINICAL DATA:  Lower extremity  weakness with frequent falling. Anticoagulated. EXAM: MRI THORACIC WITHOUT AND WITH CONTRAST TECHNIQUE: Multiplanar and multiecho pulse sequences of the thoracic spine were obtained without and with intravenous contrast. CONTRAST:  8.6mL GADAVIST GADOBUTROL 1 MMOL/ML IV SOLN COMPARISON:  Radiography 07/16/2017 FINDINGS: Alignment: No malalignment. Slightly exaggerated kyphotic curvature. Vertebrae: No regional fracture or focal bone lesion. Cord: No cord compression or primary cord lesion. No abnormal contrast enhancement. Paraspinal and other soft tissues: No significant paravertebral finding. Disc levels: No disc level pathology anywhere in the thoracic spine. No bulging or herniation of the discs. There is very minimal posterior ligamentous prominence at T2-3 and T6-7, but no significant encroachment upon the neural spaces. No abnormal contrast enhancement occurs in the region. IMPRESSION: Essentially normal examination of the thoracic spine for a person of this age. No primary cord lesion. No traumatic finding. No significant degenerative finding. No compressive narrowing of the canal or foramina. Electronically Signed   By: Nelson Chimes M.D.   On: 11/13/2020 17:08   MR Lumbar Spine W Wo Contrast  Result Date: 11/13/2020 CLINICAL DATA:  Lower extremity weakness and frequent falling. Anticoagulated. EXAM: MRI LUMBAR SPINE WITHOUT AND WITH CONTRAST TECHNIQUE: Multiplanar and multiecho pulse sequences of the lumbar spine were obtained without and with intravenous contrast. CONTRAST:  8.60mL GADAVIST GADOBUTROL 1 MMOL/ML IV SOLN COMPARISON:  Thoracic study same day.  Radiography 07/16/2017 FINDINGS: Segmentation:  5 lumbar type vertebral bodies. Alignment:  Normal Vertebrae:  No fracture or primary bone lesion. Conus medullaris and cauda equina: Conus extends to the L2 level. Conus and cauda equina appear normal. Paraspinal and other soft tissues: Negative Disc levels: No abnormality  from T12-L1 through L1-2.  L2-3: No disc abnormality. Minimal facet osteoarthritis. No stenosis. L3-4: Minimal bulging of the disc. Mild facet osteoarthritis. Mild narrowing of the lateral recesses but no neural compression. L4-5: Mild bulging of the disc. Moderate facet osteoarthritis. Mild narrowing of the lateral recesses but no neural compression. L5-S1: Mild bulging of the disc. Moderate facet osteoarthritis. Mild narrowing of the lateral recesses but no neural compression. IMPRESSION: No acute or traumatic finding. No evidence of compressive stenosis. There is ordinary lumbar region facet osteoarthritis which could contribute to back pain. Electronically Signed   By: Nelson Chimes M.D.   On: 11/13/2020 17:20   CT MAXILLOFACIAL WO CONTRAST  Result Date: 11/13/2020 CLINICAL DATA:  Facial trauma; Head trauma, minor, normal mental status (Age 40-64y), fall, on blood thinners, right eye contusion EXAM: CT HEAD WITHOUT CONTRAST CT MAXILLOFACIAL WITHOUT CONTRAST TECHNIQUE: Multidetector CT imaging of the head and maxillofacial structures were performed using the standard protocol without intravenous contrast. Multiplanar CT image reconstructions of the maxillofacial structures were also generated. COMPARISON:  Head CT 05/08/2020, MRI 08/21/2020 FINDINGS: CT HEAD FINDINGS Brain: No evidence of acute intracranial hemorrhage. There is mild global cerebral atrophy. Unchanged size of the ventricular system. Subcortical and periventricular hypodensities are unchanged for prior exam, nonspecific, but likely represent sequela of chronic small vessel ischemic disease. Vascular: No hyperdense vessel or unexpected calcification. Skull: Normal. Negative for fracture or focal lesion. Other: There is an focal scalp thickening along the high frontal scalp from prior injury. CT MAXILLOFACIAL FINDINGS Osseous: No fracture or mandibular dislocation. No destructive process. Orbits: Negative. No traumatic or inflammatory finding. Sinuses: The paranasal sinuses  are predominantly clear. Soft tissues: There is right periorbital soft tissue swelling. Possible small shallow laceration on the nose (axial image 140). IMPRESSION: No acute intracranial abnormality.  No acute facial fracture. Right periorbital soft tissue swelling. Possible small shallow soft tissue laceration on the nose. Unchanged mild cerebral atrophy and sequela of chronic small vessel ischemic disease. Electronically Signed   By: Maurine Simmering   On: 11/13/2020 13:57    Procedures Procedures   Medications Ordered in ED Medications  gadobutrol (GADAVIST) 1 MMOL/ML injection 8.5 mL (has no administration in time range)  hydrALAZINE (APRESOLINE) injection 20 mg (20 mg Intravenous Given 11/13/20 1515)  sodium chloride 0.9 % bolus 1,000 mL (1,000 mLs Intravenous New Bag/Given 11/13/20 1515)  gadobutrol (GADAVIST) 1 MMOL/ML injection 8.5 mL (8.5 mLs Intravenous Contrast Given 11/13/20 1656)    ED Course  I have reviewed the triage vital signs and the nursing notes.  Pertinent labs & imaging results that were available during my care of the patient were reviewed by me and considered in my medical decision making (see chart for details).    MDM Rules/Calculators/A&P                          75 year old male who presents to the ED today as a level 2 fall.  Has had generalized weakness in his bilateral lower extremities for several weeks with persistent falls.  Had the same thing happened today around 6 AM causing him to fall and hit his head on the bathroom tile, no loss of consciousness.  Had just taken his Xarelto prior to the fall.  Went to PCP today and was sent here for further evaluation.  On arrival to the ED patient is afebrile, nontachycardic and nontachypneic and appears to be in no acute distress.  He has obvious periorbital  ecchymosis on the right side with tenderness palpation.  Extraocular movements intact, pupils equal round reactive to light.  No focal neurodeficits and patient alert and  oriented x4.  No hyphema appreciated.,  Work-up started with plan for CT head, CT maxillofacial and labs.  Will obtain INR at this time given history of Xarelto.   CT: IMPRESSION:  No acute intracranial abnormality. No acute facial fracture.    Right periorbital soft tissue swelling. Possible small shallow soft  tissue laceration on the nose.    Unchanged mild cerebral atrophy and sequela of chronic small vessel  ischemic disease.   Pt with superficial laceration to bridge of nose; does not require sutures. Tetans UTD.  Given persistent falls and BLE weakness will obtain further workup. Has been ongoing for about 6 months but worsening recently; has seen PCP and neurology for same with negative MRI brain and C spine. Discussed case with attending physician Dr. Ashok Cordia who evaluated patient as well; recommends MRI T and L spine to rule out other organic causes causing weakness and frequent falls.   CBC without leukocytosis. Hgb stable at 14.7 CMP without electrolyte abnormalities. Creatinine stable at 1.07  Orthostatics positive; fluids provided. BP has increased during ED visit. Took his meds this AM prior to arrival. Will provide IV hydralazine at this time   MRIs negative for acute findings.  INR 2.9 Troponin 8 U/A without signs of infection   Blood pressure improved; most recently 159/92.   Workup overall reassuring. Given chronicity of pt's lower extremity weakness do not feel he requires admission for same. He has been provided with bedside commode and walker prior to discharge with plans for outpatient PCP follow up. Stable for discharge at this time.   This note was prepared using Dragon voice recognition software and may include unintentional dictation errors due to the inherent limitations of voice recognition software.  Final Clinical Impression(s) / ED Diagnoses Final diagnoses:  Frequent falls  Injury of head, initial encounter  Periorbital ecchymosis, right, initial  encounter    Rx / DC Orders ED Discharge Orders         Ordered    DME Bedside commode        11/13/20 1525    For home use only DME 4 wheeled rolling walker with seat        11/13/20 1525           Discharge Instructions     Your workup was overall reassuring in the ED today. Your MRIs did not show any acute findings to account for your leg weakness.   While at home please ice your face to help reduce the swelling. You can take Tylenol as needed for pain.   Use your walker and bedside commode as indicated.   Please follow up with your PCP regarding your ED visit and for further evaluation of your weakness        Eustaquio Maize, PA-C 11/13/20 1752    Lajean Saver, MD 11/15/20 1610

## 2020-11-13 NOTE — Discharge Planning (Signed)
Fuller Mandril, RN, BSN, [place] [phone] Pt qualifies for DME rolling walker and bedside commode.  DME  ordered through [employer].  Freda Munro of [place] notified to deliver to pt room prior to D/C home.

## 2020-11-13 NOTE — Progress Notes (Signed)
Orthopedic Tech Progress Note Patient Details:  [name] 03-Jul-1945 [id number] Level 2 trauma Patient ID: [name], male   DOB: 1945/07/21, 75 y.o.   MRN: [id number]   Janit Pagan 11/13/2020, 1:29 PM

## 2020-11-13 NOTE — ED Notes (Signed)
Will update vitals when patient returns from MRI

## 2020-11-13 NOTE — Telephone Encounter (Signed)
error 

## 2020-11-19 ENCOUNTER — Other Ambulatory Visit: Payer: Self-pay | Admitting: Family Medicine

## 2020-11-25 ENCOUNTER — Other Ambulatory Visit: Payer: Self-pay | Admitting: *Deleted

## 2020-11-25 MED ORDER — AMIODARONE HCL 200 MG PO TABS: 200.0000 mg | ORAL_TABLET | Freq: Every day | ORAL | 2 refills | Status: DC

## 2020-11-26 ENCOUNTER — Ambulatory Visit: Payer: Medicare HMO

## 2020-11-26 ENCOUNTER — Telehealth: Payer: Self-pay

## 2020-11-26 ENCOUNTER — Ambulatory Visit (INDEPENDENT_AMBULATORY_CARE_PROVIDER_SITE_OTHER): Payer: Medicare HMO | Admitting: Family Medicine

## 2020-11-26 ENCOUNTER — Encounter: Payer: Self-pay | Admitting: Family Medicine

## 2020-11-26 ENCOUNTER — Other Ambulatory Visit: Payer: Self-pay

## 2020-11-26 VITALS — BP 110/60 | HR 60 | Ht 72.0 in | Wt 180.0 lb

## 2020-11-26 DIAGNOSIS — R002 Palpitations: Secondary | ICD-10-CM | POA: Diagnosis not present

## 2020-11-26 DIAGNOSIS — M79604 Pain in right leg: Secondary | ICD-10-CM

## 2020-11-26 DIAGNOSIS — M79605 Pain in left leg: Secondary | ICD-10-CM

## 2020-11-26 DIAGNOSIS — F419 Anxiety disorder, unspecified: Secondary | ICD-10-CM | POA: Diagnosis not present

## 2020-11-26 DIAGNOSIS — R296 Repeated falls: Secondary | ICD-10-CM | POA: Diagnosis not present

## 2020-11-26 DIAGNOSIS — F32A Depression, unspecified: Secondary | ICD-10-CM

## 2020-11-26 DIAGNOSIS — I10 Essential (primary) hypertension: Secondary | ICD-10-CM

## 2020-11-26 DIAGNOSIS — Z7901 Long term (current) use of anticoagulants: Secondary | ICD-10-CM

## 2020-11-26 DIAGNOSIS — R270 Ataxia, unspecified: Secondary | ICD-10-CM

## 2020-11-26 DIAGNOSIS — G8929 Other chronic pain: Secondary | ICD-10-CM | POA: Diagnosis not present

## 2020-11-26 DIAGNOSIS — R69 Illness, unspecified: Secondary | ICD-10-CM | POA: Diagnosis not present

## 2020-11-26 LAB — POCT INR: INR: 3.8 — AB (ref 2.0–3.0)

## 2020-11-26 MED ORDER — DULOXETINE HCL 20 MG PO CPEP: 40.0000 mg | ORAL_CAPSULE | Freq: Every day | ORAL | 2 refills | Status: DC

## 2020-11-26 NOTE — Assessment & Plan Note (Signed)
Has been orthostatic in the past.  BP low normal in the office but appropriate readings at home so we will continue current medications.

## 2020-11-26 NOTE — Progress Notes (Signed)
Patient is having trouble affording duloxetine.  Called [facility] it will cost them $30/ month or $141/3 months and there is no PA popping up.  This is because the medication is a tier 3.  Reviewed formulary and noticed that duloxetine is available for mailorder.  Explained to patient and wife that sometimes, if mail ordered, the meds are reduced in cost and occasionally free.  They will contact insurance and see how to go about getting a cheaper alternative as they would prefer NOT to change from duloxetine.  Christen Bame, CMA

## 2020-11-26 NOTE — Patient Instructions (Addendum)
It was nice seeing you today!  I have placed a referral for a different neurologist.  I have resent the prescription for duloxetine for a 30-day supply with refills.  Talk to your cardiologist about warfarin given the number of falls you have had.   Follow-up in 1-2 months.  Please arrive at least 15 minutes prior to your scheduled appointments.  Stay well, Zola Button, MD [place] [phone]

## 2020-11-26 NOTE — Telephone Encounter (Signed)
Pt was seen in [facility]madin Clinic today after seeing his PCP Dr Nancy Fetter (OV note from today in [place]).  Pt states his PCP advised him to make his cardiologist Dr Irish Lack aware he is falling more frequently.  Pt went to the ED on 11/13/20 after falling and hitting his head per wife's report, with significant bleeding.  CT and MRI negative for acute findings.  Pt's wife states pt has fallen 5 times since 10/23/20 and has had to have 2 ED visits since 12/21 for head injury s/p fall.  PCP is concerned about pt being on Warfarin and increased frequency of falls.  I advised pt and his wife that with Warfarin it's a balancing act of risks verses benefits.  When clot/stroke risks are greater that the risk of serious fall/bleeding pt usually stays on Warfarin, but when fall/bleeding risks become greater than clot/stroke risk sometimes Warfarin is discontinued.  This decision is usually made by the doctor after discussing with pt and family members risks and benefits.  Advised pt and his wife I would forward a message to Dr Irish Lack and his nurse to review and advise of his recommendations. They verbalized understanding and will await further communication from Dr Irish Lack and or his nurse.

## 2020-11-26 NOTE — Progress Notes (Signed)
SUBJECTIVE:   CHIEF COMPLAINT / HPI:   Balance issues Patient recently seen in clinic on 6/8 after a fall with head injury. He had frequent falls at that time with 5 falls since May. He was sent to the ED given that he is on warfarin. In the ED, CT head unremarkable, MRI T and L spine also unremarkable. He was discharged home. Today, he is still having pain in his bilateral legs. Issues with balance mostly within the past 2-3 weeks.  Wife states that he has been using his cane more often. Has had right-sided headache since the fall. Wife states he had slid off the bed while doing PT exercise on the bed which caused the fall. Wife has had to pick him off from the floor twice since the fall. Weakness has been worsening since fall, reports occasional lightheadedness, but primarily is having issues with balance. Has been leaning towards his right since the fall Dropping things more, can be with either hand. Taking tizanidine as needed for lower extremity pain which does help, only takes this at night Wife wants testing for Guillian-barre and other neurological conditions (Parkinson's, ALS, MS, etc.), felt his balance issues worsened after RSV infection 05/2020 This all has been affecting his mood, feeling more depressed.  Patient was recently seen by Dr. Rexene Alberts with neurology on 08/27/2020.  Patient request referral to see a different neurologist.  Patient had an MRI of his brain and cervical spine in March of this year which were unremarkable other than disc degeneration greatest at C5-6 where there is moderate bilateral neuroforaminal stenosis.  Depression Patient states he has been feeling more depressed given his recent balance issues. Requesting 30-day supply for duloxetine to reduce payment. Currently self-tapering off of duloxetine but is running out of medication.  Previously, he was on duloxetine 40 mg but has recently been on duloxetine 20 mg.  Denies SI.  HTN Reports BP at home  has mostly been in the 140s over 80s.  PERTINENT  PMH / PSH: History of CVA (with residual left-sided weakness), A. fib on warfarin, CAD, HTN, chronic bilateral lower extremity pain  OBJECTIVE:   BP 110/60   Pulse 60   Ht 6' (1.829 m)   Wt 180 lb (81.6 kg)   SpO2 98%   BMI 24.41 kg/m   General: Thin elderly male sitting in wheelchair, NAD CV: RRR, no murmurs Pulm: CTAB, no wheezes or rales Neuro: CN II through XII intact, tone normal, 4+/5 strength throughout (including grip strength, arm flexion/extension, shoulder abduction/adduction, hip flexion/extension, knee flexion/extension, dorsiflexion/plantarflexion), 2+ reflexes biceps, ataxic finger-to-nose on the left, normal on right, gait testing deferred  Depression screen [facility] 2/9 11/26/2020  Decreased Interest 1  Down, Depressed, Hopeless 1  PHQ - 2 Score 2  Altered sleeping 1  Tired, decreased energy 1  Change in appetite 1  Feeling bad or failure about yourself  1  Trouble concentrating 1  Moving slowly or fidgety/restless 1  Suicidal thoughts 1  PHQ-9 Score 9  Difficult doing work/chores -  Some recent data might be hidden     ASSESSMENT/PLAN:   Essential hypertension Has been orthostatic in the past.  BP low normal in the office but appropriate readings at home so we will continue current medications.   Ataxia Etiology unclear at this time, CT head unremarkable at recent visit to the ED and with normal brain MRI within the past few months.  No significant muscle weakness appreciated on exam.  He does have  some ataxia with finger-to-nose testing on the left.  No muscle rigidity to suggest Parkinson's disease.  Timeline seems unusual for Guillain-Barr syndrome.  Could consider SNRI withdrawal contributing to dizziness/falls.  We will go ahead and refer to neurology per patient request, could possibly benefit from nerve conduction testing and/or possibly LP, will defer to neurology for further work-up. - amb ref neurology  per patient request  Depression Worsening in light of recent gait issues and self tapering off of medication due to insurance change.  Reorder duloxetine 40 mg with 30-day supply per patient request.  Patient will call insurance to see if mail order will be cheaper.  F/u 1-2 months  Zola Button, MD [place]

## 2020-11-26 NOTE — Patient Instructions (Signed)
Description   Skip today's dosage of Warfarin, then start taking 1/2 tablet daily except for 1 tablet on Sundays and Thursdays.  Recheck INR in 2 weeks. Coumadin Clinic [phone].

## 2020-12-02 NOTE — Telephone Encounter (Signed)
Risk of stroke will increase if COUmadin is stopped.   Has he tried walker or other aids to help him stop falling?   JV   I spoke with patient's wife and gave her information from Dr Irish Lack.     Patient was using a cane prior to ED visit for fall earlier in June 2022. After this fall he started using a walker.  Has fallen since using walker but has not needed to be evaluated. Wife states he will try to get out of bed and slide to floor.  She has been able to help him get up recently.  A few days ago he began using a wheelchair and has not fallen since using wheelchair.  Was seeing PT but has not seen recently.  Last appointment was cancelled by PT and patient has not followed up to see if appointment will be rescheduled.  Has not heard from PT about any more appointments. She voices frustration that reason for falls has not been determined.

## 2020-12-02 NOTE — Telephone Encounter (Signed)
I would prefer that he stay on the Coumadin as long as he can do this safely.  We will try to get physical therapy back involved.  He may need to see his primary care doctor about leg weakness.   Since he is now in a wheelchair more without falls, will plan to continue anticoagulation.   JV   Patient notified.  He will contact PT regarding follow up.  Will also follow up with PCP regarding falls.

## 2020-12-05 ENCOUNTER — Other Ambulatory Visit: Payer: Self-pay | Admitting: Family Medicine

## 2020-12-09 DIAGNOSIS — R32 Unspecified urinary incontinence: Secondary | ICD-10-CM | POA: Diagnosis not present

## 2020-12-09 DIAGNOSIS — R531 Weakness: Secondary | ICD-10-CM | POA: Diagnosis not present

## 2020-12-09 DIAGNOSIS — R270 Ataxia, unspecified: Secondary | ICD-10-CM | POA: Diagnosis not present

## 2020-12-09 DIAGNOSIS — W19XXXA Unspecified fall, initial encounter: Secondary | ICD-10-CM | POA: Diagnosis not present

## 2020-12-09 DIAGNOSIS — R4181 Age-related cognitive decline: Secondary | ICD-10-CM | POA: Diagnosis not present

## 2020-12-09 DIAGNOSIS — G319 Degenerative disease of nervous system, unspecified: Secondary | ICD-10-CM | POA: Diagnosis not present

## 2020-12-09 DIAGNOSIS — S301XXA Contusion of abdominal wall, initial encounter: Secondary | ICD-10-CM | POA: Diagnosis not present

## 2020-12-09 DIAGNOSIS — I69954 Hemiplegia and hemiparesis following unspecified cerebrovascular disease affecting left non-dominant side: Secondary | ICD-10-CM | POA: Diagnosis not present

## 2020-12-09 DIAGNOSIS — R29818 Other symptoms and signs involving the nervous system: Secondary | ICD-10-CM | POA: Diagnosis not present

## 2020-12-09 DIAGNOSIS — I4891 Unspecified atrial fibrillation: Secondary | ICD-10-CM | POA: Diagnosis not present

## 2020-12-09 DIAGNOSIS — G9389 Other specified disorders of brain: Secondary | ICD-10-CM | POA: Diagnosis not present

## 2020-12-09 DIAGNOSIS — R296 Repeated falls: Secondary | ICD-10-CM | POA: Diagnosis not present

## 2020-12-09 DIAGNOSIS — R0781 Pleurodynia: Secondary | ICD-10-CM | POA: Diagnosis not present

## 2020-12-09 DIAGNOSIS — G20 Parkinson's disease: Secondary | ICD-10-CM | POA: Diagnosis not present

## 2020-12-09 DIAGNOSIS — Z20822 Contact with and (suspected) exposure to covid-19: Secondary | ICD-10-CM | POA: Diagnosis not present

## 2020-12-09 DIAGNOSIS — J449 Chronic obstructive pulmonary disease, unspecified: Secondary | ICD-10-CM | POA: Diagnosis not present

## 2020-12-09 DIAGNOSIS — G919 Hydrocephalus, unspecified: Secondary | ICD-10-CM | POA: Diagnosis not present

## 2020-12-09 DIAGNOSIS — I10 Essential (primary) hypertension: Secondary | ICD-10-CM | POA: Diagnosis not present

## 2020-12-09 DIAGNOSIS — S20222A Contusion of left back wall of thorax, initial encounter: Secondary | ICD-10-CM | POA: Diagnosis not present

## 2020-12-10 DIAGNOSIS — R2681 Unsteadiness on feet: Secondary | ICD-10-CM | POA: Diagnosis not present

## 2020-12-10 DIAGNOSIS — I69398 Other sequelae of cerebral infarction: Secondary | ICD-10-CM | POA: Diagnosis not present

## 2020-12-10 DIAGNOSIS — K5900 Constipation, unspecified: Secondary | ICD-10-CM | POA: Diagnosis not present

## 2020-12-10 DIAGNOSIS — R296 Repeated falls: Secondary | ICD-10-CM | POA: Diagnosis not present

## 2020-12-10 DIAGNOSIS — I4891 Unspecified atrial fibrillation: Secondary | ICD-10-CM | POA: Diagnosis not present

## 2020-12-10 DIAGNOSIS — S20222A Contusion of left back wall of thorax, initial encounter: Secondary | ICD-10-CM | POA: Diagnosis not present

## 2020-12-10 DIAGNOSIS — Z043 Encounter for examination and observation following other accident: Secondary | ICD-10-CM | POA: Diagnosis not present

## 2020-12-10 DIAGNOSIS — R531 Weakness: Secondary | ICD-10-CM | POA: Diagnosis not present

## 2020-12-10 DIAGNOSIS — M549 Dorsalgia, unspecified: Secondary | ICD-10-CM | POA: Diagnosis not present

## 2020-12-11 DIAGNOSIS — I4891 Unspecified atrial fibrillation: Secondary | ICD-10-CM | POA: Diagnosis not present

## 2020-12-11 DIAGNOSIS — M549 Dorsalgia, unspecified: Secondary | ICD-10-CM | POA: Diagnosis not present

## 2020-12-11 DIAGNOSIS — R2681 Unsteadiness on feet: Secondary | ICD-10-CM | POA: Diagnosis not present

## 2020-12-11 DIAGNOSIS — I69398 Other sequelae of cerebral infarction: Secondary | ICD-10-CM | POA: Diagnosis not present

## 2020-12-11 DIAGNOSIS — R531 Weakness: Secondary | ICD-10-CM | POA: Diagnosis not present

## 2020-12-12 DIAGNOSIS — I4891 Unspecified atrial fibrillation: Secondary | ICD-10-CM | POA: Diagnosis not present

## 2020-12-12 DIAGNOSIS — M549 Dorsalgia, unspecified: Secondary | ICD-10-CM | POA: Diagnosis not present

## 2020-12-12 DIAGNOSIS — R531 Weakness: Secondary | ICD-10-CM | POA: Diagnosis not present

## 2020-12-12 DIAGNOSIS — I69398 Other sequelae of cerebral infarction: Secondary | ICD-10-CM | POA: Diagnosis not present

## 2020-12-12 DIAGNOSIS — R2681 Unsteadiness on feet: Secondary | ICD-10-CM | POA: Diagnosis not present

## 2020-12-13 DIAGNOSIS — R2681 Unsteadiness on feet: Secondary | ICD-10-CM | POA: Diagnosis not present

## 2020-12-13 DIAGNOSIS — R531 Weakness: Secondary | ICD-10-CM | POA: Diagnosis not present

## 2020-12-13 DIAGNOSIS — Z7901 Long term (current) use of anticoagulants: Secondary | ICD-10-CM | POA: Diagnosis not present

## 2020-12-13 DIAGNOSIS — S301XXA Contusion of abdominal wall, initial encounter: Secondary | ICD-10-CM | POA: Diagnosis not present

## 2020-12-13 DIAGNOSIS — M549 Dorsalgia, unspecified: Secondary | ICD-10-CM | POA: Diagnosis not present

## 2020-12-13 DIAGNOSIS — R109 Unspecified abdominal pain: Secondary | ICD-10-CM | POA: Diagnosis not present

## 2020-12-13 DIAGNOSIS — I69398 Other sequelae of cerebral infarction: Secondary | ICD-10-CM | POA: Diagnosis not present

## 2020-12-13 DIAGNOSIS — I4891 Unspecified atrial fibrillation: Secondary | ICD-10-CM | POA: Diagnosis not present

## 2020-12-14 DIAGNOSIS — I4891 Unspecified atrial fibrillation: Secondary | ICD-10-CM | POA: Diagnosis not present

## 2020-12-14 DIAGNOSIS — M549 Dorsalgia, unspecified: Secondary | ICD-10-CM | POA: Diagnosis not present

## 2020-12-14 DIAGNOSIS — R531 Weakness: Secondary | ICD-10-CM | POA: Diagnosis not present

## 2020-12-14 DIAGNOSIS — I69398 Other sequelae of cerebral infarction: Secondary | ICD-10-CM | POA: Diagnosis not present

## 2020-12-14 DIAGNOSIS — R2681 Unsteadiness on feet: Secondary | ICD-10-CM | POA: Diagnosis not present

## 2020-12-14 DIAGNOSIS — Z7901 Long term (current) use of anticoagulants: Secondary | ICD-10-CM | POA: Diagnosis not present

## 2020-12-15 DIAGNOSIS — K6819 Other retroperitoneal abscess: Secondary | ICD-10-CM | POA: Diagnosis not present

## 2020-12-15 DIAGNOSIS — R2681 Unsteadiness on feet: Secondary | ICD-10-CM | POA: Diagnosis not present

## 2020-12-15 DIAGNOSIS — R296 Repeated falls: Secondary | ICD-10-CM | POA: Diagnosis not present

## 2020-12-15 DIAGNOSIS — I4891 Unspecified atrial fibrillation: Secondary | ICD-10-CM | POA: Diagnosis not present

## 2020-12-15 DIAGNOSIS — M549 Dorsalgia, unspecified: Secondary | ICD-10-CM | POA: Diagnosis not present

## 2020-12-15 DIAGNOSIS — S300XXD Contusion of lower back and pelvis, subsequent encounter: Secondary | ICD-10-CM | POA: Diagnosis not present

## 2020-12-15 DIAGNOSIS — M7981 Nontraumatic hematoma of soft tissue: Secondary | ICD-10-CM | POA: Diagnosis not present

## 2020-12-16 ENCOUNTER — Ambulatory Visit: Payer: Medicare HMO | Attending: Podiatry | Admitting: Physical Therapy

## 2020-12-16 DIAGNOSIS — R296 Repeated falls: Secondary | ICD-10-CM | POA: Diagnosis not present

## 2020-12-16 DIAGNOSIS — S300XXD Contusion of lower back and pelvis, subsequent encounter: Secondary | ICD-10-CM | POA: Diagnosis not present

## 2020-12-16 DIAGNOSIS — R2681 Unsteadiness on feet: Secondary | ICD-10-CM | POA: Diagnosis not present

## 2020-12-16 DIAGNOSIS — S300XXA Contusion of lower back and pelvis, initial encounter: Secondary | ICD-10-CM | POA: Diagnosis not present

## 2020-12-16 DIAGNOSIS — W19XXXA Unspecified fall, initial encounter: Secondary | ICD-10-CM | POA: Diagnosis not present

## 2020-12-16 DIAGNOSIS — I4891 Unspecified atrial fibrillation: Secondary | ICD-10-CM | POA: Diagnosis not present

## 2020-12-16 DIAGNOSIS — D649 Anemia, unspecified: Secondary | ICD-10-CM | POA: Diagnosis not present

## 2020-12-16 DIAGNOSIS — R270 Ataxia, unspecified: Secondary | ICD-10-CM | POA: Diagnosis not present

## 2020-12-16 DIAGNOSIS — M549 Dorsalgia, unspecified: Secondary | ICD-10-CM | POA: Diagnosis not present

## 2020-12-17 DIAGNOSIS — M549 Dorsalgia, unspecified: Secondary | ICD-10-CM | POA: Diagnosis not present

## 2020-12-17 DIAGNOSIS — R2681 Unsteadiness on feet: Secondary | ICD-10-CM | POA: Diagnosis not present

## 2020-12-17 DIAGNOSIS — W19XXXA Unspecified fall, initial encounter: Secondary | ICD-10-CM | POA: Diagnosis not present

## 2020-12-17 DIAGNOSIS — S300XXA Contusion of lower back and pelvis, initial encounter: Secondary | ICD-10-CM | POA: Diagnosis not present

## 2020-12-17 DIAGNOSIS — R296 Repeated falls: Secondary | ICD-10-CM | POA: Diagnosis not present

## 2020-12-17 DIAGNOSIS — I4891 Unspecified atrial fibrillation: Secondary | ICD-10-CM | POA: Diagnosis not present

## 2020-12-17 DIAGNOSIS — S300XXD Contusion of lower back and pelvis, subsequent encounter: Secondary | ICD-10-CM | POA: Diagnosis not present

## 2020-12-18 ENCOUNTER — Telehealth: Payer: Self-pay | Admitting: Physical Therapy

## 2020-12-18 DIAGNOSIS — M549 Dorsalgia, unspecified: Secondary | ICD-10-CM | POA: Diagnosis not present

## 2020-12-18 DIAGNOSIS — R296 Repeated falls: Secondary | ICD-10-CM | POA: Diagnosis not present

## 2020-12-18 DIAGNOSIS — R2681 Unsteadiness on feet: Secondary | ICD-10-CM | POA: Diagnosis not present

## 2020-12-18 DIAGNOSIS — I4891 Unspecified atrial fibrillation: Secondary | ICD-10-CM | POA: Diagnosis not present

## 2020-12-18 DIAGNOSIS — S300XXD Contusion of lower back and pelvis, subsequent encounter: Secondary | ICD-10-CM | POA: Diagnosis not present

## 2020-12-18 NOTE — Telephone Encounter (Addendum)
Attempted to contact patient due to missed PT appointment on 12/18/2020. Left VM informing patient of missed appointment and since it has been over 3 months since last visit he has been discharged from PT and will need new PT referral prior to schedule any future appointments.   Hilda Blades, PT, DPT, LAT, ATC 12/18/20  9:57 AM Phone: [phone] Fax: [phone]

## 2020-12-19 DIAGNOSIS — I4891 Unspecified atrial fibrillation: Secondary | ICD-10-CM | POA: Diagnosis not present

## 2020-12-19 DIAGNOSIS — S301XXD Contusion of abdominal wall, subsequent encounter: Secondary | ICD-10-CM | POA: Diagnosis not present

## 2020-12-19 DIAGNOSIS — I10 Essential (primary) hypertension: Secondary | ICD-10-CM | POA: Diagnosis not present

## 2020-12-19 DIAGNOSIS — G20 Parkinson's disease: Secondary | ICD-10-CM | POA: Diagnosis not present

## 2020-12-19 DIAGNOSIS — Z8673 Personal history of transient ischemic attack (TIA), and cerebral infarction without residual deficits: Secondary | ICD-10-CM | POA: Diagnosis not present

## 2020-12-20 DIAGNOSIS — I10 Essential (primary) hypertension: Secondary | ICD-10-CM | POA: Diagnosis not present

## 2020-12-20 DIAGNOSIS — I252 Old myocardial infarction: Secondary | ICD-10-CM | POA: Diagnosis not present

## 2020-12-20 DIAGNOSIS — I69354 Hemiplegia and hemiparesis following cerebral infarction affecting left non-dominant side: Secondary | ICD-10-CM | POA: Diagnosis not present

## 2020-12-20 DIAGNOSIS — K219 Gastro-esophageal reflux disease without esophagitis: Secondary | ICD-10-CM | POA: Diagnosis not present

## 2020-12-20 DIAGNOSIS — Z955 Presence of coronary angioplasty implant and graft: Secondary | ICD-10-CM | POA: Diagnosis not present

## 2020-12-20 DIAGNOSIS — W19XXXD Unspecified fall, subsequent encounter: Secondary | ICD-10-CM | POA: Diagnosis not present

## 2020-12-20 DIAGNOSIS — I4891 Unspecified atrial fibrillation: Secondary | ICD-10-CM | POA: Diagnosis not present

## 2020-12-22 ENCOUNTER — Encounter: Payer: Self-pay | Admitting: Family Medicine

## 2020-12-22 DIAGNOSIS — G259 Extrapyramidal and movement disorder, unspecified: Secondary | ICD-10-CM | POA: Insufficient documentation

## 2020-12-22 DIAGNOSIS — T148XXA Other injury of unspecified body region, initial encounter: Secondary | ICD-10-CM | POA: Insufficient documentation

## 2020-12-23 ENCOUNTER — Telehealth: Payer: Self-pay

## 2020-12-23 ENCOUNTER — Other Ambulatory Visit: Payer: Self-pay

## 2020-12-23 ENCOUNTER — Ambulatory Visit (INDEPENDENT_AMBULATORY_CARE_PROVIDER_SITE_OTHER): Payer: Medicare HMO | Admitting: Family Medicine

## 2020-12-23 ENCOUNTER — Ambulatory Visit: Payer: Medicare HMO | Admitting: Family Medicine

## 2020-12-23 ENCOUNTER — Encounter: Payer: Self-pay | Admitting: Family Medicine

## 2020-12-23 VITALS — BP 126/65 | HR 65 | Ht 72.0 in | Wt 177.6 lb

## 2020-12-23 DIAGNOSIS — E041 Nontoxic single thyroid nodule: Secondary | ICD-10-CM

## 2020-12-23 DIAGNOSIS — I10 Essential (primary) hypertension: Secondary | ICD-10-CM | POA: Diagnosis not present

## 2020-12-23 DIAGNOSIS — F419 Anxiety disorder, unspecified: Secondary | ICD-10-CM | POA: Diagnosis not present

## 2020-12-23 DIAGNOSIS — K219 Gastro-esophageal reflux disease without esophagitis: Secondary | ICD-10-CM | POA: Diagnosis not present

## 2020-12-23 DIAGNOSIS — I4891 Unspecified atrial fibrillation: Secondary | ICD-10-CM | POA: Diagnosis not present

## 2020-12-23 DIAGNOSIS — F32A Depression, unspecified: Secondary | ICD-10-CM

## 2020-12-23 DIAGNOSIS — Z955 Presence of coronary angioplasty implant and graft: Secondary | ICD-10-CM | POA: Diagnosis not present

## 2020-12-23 DIAGNOSIS — G259 Extrapyramidal and movement disorder, unspecified: Secondary | ICD-10-CM | POA: Diagnosis not present

## 2020-12-23 DIAGNOSIS — I480 Paroxysmal atrial fibrillation: Secondary | ICD-10-CM

## 2020-12-23 DIAGNOSIS — W19XXXD Unspecified fall, subsequent encounter: Secondary | ICD-10-CM | POA: Diagnosis not present

## 2020-12-23 DIAGNOSIS — I69354 Hemiplegia and hemiparesis following cerebral infarction affecting left non-dominant side: Secondary | ICD-10-CM | POA: Diagnosis not present

## 2020-12-23 DIAGNOSIS — R296 Repeated falls: Secondary | ICD-10-CM | POA: Diagnosis not present

## 2020-12-23 DIAGNOSIS — T148XXA Other injury of unspecified body region, initial encounter: Secondary | ICD-10-CM | POA: Diagnosis not present

## 2020-12-23 DIAGNOSIS — R69 Illness, unspecified: Secondary | ICD-10-CM | POA: Diagnosis not present

## 2020-12-23 DIAGNOSIS — I252 Old myocardial infarction: Secondary | ICD-10-CM | POA: Diagnosis not present

## 2020-12-23 MED ORDER — DULOXETINE HCL 20 MG PO CPEP: 40.0000 mg | ORAL_CAPSULE | Freq: Every day | ORAL | 3 refills | Status: DC

## 2020-12-23 MED ORDER — CARBIDOPA-LEVODOPA 25-100 MG PO TABS: 1.0000 | ORAL_TABLET | Freq: Three times a day (TID) | ORAL | 0 refills | Status: DC

## 2020-12-23 MED ORDER — DULOXETINE HCL 20 MG PO CPEP: 40.0000 mg | ORAL_CAPSULE | Freq: Every day | ORAL | 2 refills | Status: DC

## 2020-12-23 NOTE — Progress Notes (Signed)
SUBJECTIVE:   CHIEF COMPLAINT: medication cost, hospital follow up HPI:   [name] is a 75 y.o. yo with history notable for atrial fibrillation previously on warfarin, frequent falls, possible movement disorder and cerebrovascular disease presenting for hospital follow-up.  The patient was recently admitted to [facility] for a 10-day hospital stay.  He is admitted for a paraspinal muscle hematoma in the setting of warfarin use.  Warfarin was held on discharge.  Hospital course was complicated by worsening of anemia, gait instability.  During his hospital stay it was determined he may have a Parkinson's-like syndrome and he was started on Sinemet 25 103 times a day.  He reports improvement in his symptoms.  His wife has noted marked improvement in his gait.  He is not falling backwards as frequently.  The patient has a number of home assistive devices now present.  He has a walker, wheelchair, shower chair, toilet chair and handrails.  He has had no falls since leaving the hospital.  The patient reports compliance with his medications.  He is currently on aspirin therapy.  He is not taking his warfarin.  He denies syncope, palpitations, lightheadedness, dizziness.  He reports his back pain is controlled.  PERTINENT  PMH / PSH/Family/Social History : Patient reports a remote history of a thyroid nodule removal in [place]:   BP 126/65   Pulse 65   Ht 6' (1.829 m)   Wt 177 lb 9.6 oz (80.6 kg)   SpO2 98%   BMI 24.09 kg/m   Today's weight:  Last Weight  Most recent update: 12/23/2020 10:23 AM    Weight  80.6 kg (177 lb 9.6 oz)            Review of prior weights: Filed Weights   12/23/20 1022  Weight: 177 lb 9.6 oz (80.6 kg)     Cardiac: Regular rate and rhythm. Normal S1/S2. No murmurs, rubs, or gallops appreciated. Lungs: Clear bilaterally to ascultation.  Psych: Pleasant and appropriate  Normal fluid speech Tympanic  membrane's examined moderate cerumen in external auditory canals Gait was not examined  Labs, imaging reviewed from hospital stay.  Reviewed his CT scans as well as have his MRI of the brain.  Incidental findings include thyroid nodule   ASSESSMENT/PLAN:   AF (paroxysmal atrial fibrillation) ([place]) CHADS2VASC is 5, HASBLED of 3, will repeat CBC today given recent paraspinal muscle hematoma.  He has had no falls in recent past.  Recommend careful discussion of need for anticoagulation going forward.  If able would be preferable to have transition to a DOAC as these appear to have a lower potential for bleeding.  Follow-up scheduled with cardiology at the time of the visit today.  Suspected Movement disorder- possibly PD  Patient has not yet heard from [facility] neurology about appointment.  External referral placed to [facility] movement disorder clinic.  Discussed recommendations for MRA with the patient and his wife.  Given various places of care [facility]) recommend evaluation with neurology and imaging be obtained at [facility] as they recommended this imaging.  Patient and wife agreeable to this.  Thyroid nodule Discussed with patient and wife.  TSH in the hospital was normal.  Recommended ultrasound, they would like this to be ordered and scheduled at follow-up with Dr. [name] as they have a number of upcoming appointments.  Frequent falls Patient currently receiving PT OT services.   HCM  UTD on vaccines  At follow up, order  and schedule thyroid ultrasound for nodule     William Singh, MD  [place]

## 2020-12-23 NOTE — Telephone Encounter (Signed)
Melissa from [facility] calling for OT verbal orders as follows:  1 time(s) weekly for 3 week(s), then starting 8/1 2 time(s) weekly for 4 week(s), then 1 time per week for one week.   Verbal orders given per [facility] protocol  Talbot Grumbling, RN

## 2020-12-23 NOTE — Patient Instructions (Signed)
It was wonderful to see you today.  Please bring ALL of your medications with you to every visit.   Today we talked about:   - I sent Cymbalta to your mail order pharmacy   - I will message your heart doctor's nurse and neurologist about appointments  - Please call Dr. Hebert Soho office for a visit  -Follow up with Dr. [name]  - I will cal you with results of blood work    Thank you for choosing [facility].   Please call [phone] with any questions about today's appointment.  Please be sure to schedule follow up at the front  desk before you leave today.   Dorris Singh, MD  Family Medicine

## 2020-12-23 NOTE — Assessment & Plan Note (Signed)
Patient currently receiving PT OT services.

## 2020-12-23 NOTE — Assessment & Plan Note (Signed)
Patient has not yet heard from [facility] neurology about appointment.  External referral placed to [facility] movement disorder clinic.  Discussed recommendations for MRA with the patient and his wife.  Given various places of care [facility]) recommend evaluation with neurology and imaging be obtained at [facility] as they recommended this imaging.  Patient and wife agreeable to this.

## 2020-12-23 NOTE — Assessment & Plan Note (Signed)
Discussed with patient and wife.  TSH in the hospital was normal.  Recommended ultrasound, they would like this to be ordered and scheduled at follow-up with Dr. [name] as they have a number of upcoming appointments.

## 2020-12-23 NOTE — Assessment & Plan Note (Signed)
CHADS2VASC is 5, HASBLED of 3, will repeat CBC today given recent paraspinal muscle hematoma.  He has had no falls in recent past.  Recommend careful discussion of need for anticoagulation going forward.  If able would be preferable to have transition to a DOAC as these appear to have a lower potential for bleeding.  Follow-up scheduled with cardiology at the time of the visit today.

## 2020-12-24 ENCOUNTER — Telehealth: Payer: Self-pay

## 2020-12-24 ENCOUNTER — Telehealth: Payer: Self-pay | Admitting: Family Medicine

## 2020-12-24 ENCOUNTER — Telehealth: Payer: Self-pay | Admitting: Neurology

## 2020-12-24 LAB — COMPREHENSIVE METABOLIC PANEL
ALT: 11 IU/L (ref 0–44)
AST: 19 IU/L (ref 0–40)
Albumin/Globulin Ratio: 1.5 (ref 1.2–2.2)
Albumin: 3.7 g/dL (ref 3.7–4.7)
Alkaline Phosphatase: 106 IU/L (ref 44–121)
BUN/Creatinine Ratio: 14 (ref 10–24)
BUN: 15 mg/dL (ref 8–27)
Bilirubin Total: 0.6 mg/dL (ref 0.0–1.2)
CO2: 26 mmol/L (ref 20–29)
Calcium: 9.1 mg/dL (ref 8.6–10.2)
Chloride: 101 mmol/L (ref 96–106)
Creatinine, Ser: 1.05 mg/dL (ref 0.76–1.27)
Globulin, Total: 2.5 g/dL (ref 1.5–4.5)
Glucose: 101 mg/dL — ABNORMAL HIGH (ref 65–99)
Potassium: 4.2 mmol/L (ref 3.5–5.2)
Sodium: 138 mmol/L (ref 134–144)
Total Protein: 6.2 g/dL (ref 6.0–8.5)
eGFR: 74 mL/min/{1.73_m2} (ref >59)

## 2020-12-24 LAB — CBC
Hematocrit: 36 % — ABNORMAL LOW (ref 37.5–51.0)
Hemoglobin: 11.9 g/dL — ABNORMAL LOW (ref 13.0–17.7)
MCH: 31.2 pg (ref 26.6–33.0)
MCHC: 33.1 g/dL (ref 31.5–35.7)
MCV: 95 fL (ref 79–97)
Platelets: 288 10*3/uL (ref 150–450)
RBC: 3.81 x10E6/uL — ABNORMAL LOW (ref 4.14–5.80)
RDW: 14.4 % (ref 11.6–15.4)
WBC: 8 10*3/uL (ref 3.4–10.8)

## 2020-12-24 LAB — FERRITIN: Ferritin: 126 ng/mL (ref 30–400)

## 2020-12-24 NOTE — Telephone Encounter (Signed)
Called patient with results.  Hemoglobin is improved, we discussed.  Likely can restart warfarin at cardiology appointment.  We also discussed his normal metabolic panel.  Glucose slightly elevated we will keep an eye on this.  I have contacted his neurologist office to have them coordinate an appointment for him given his potential new diagnosis.  Patient and wife request alternative provider to Dr. Rexene Alberts.   Dorris Singh, MD  Family Medicine Teaching Service

## 2020-12-24 NOTE — Telephone Encounter (Signed)
Chart reviewed, he was seen by Dr. Rexene Alberts since 2019, reported history of stroke many years ago.  Most recent visit was on August 27 2020 by Dr. Rexene Alberts,   I would encourage him to stay with current provider.

## 2020-12-24 NOTE — Telephone Encounter (Signed)
Mitzi Hansen from [facility] for PT verbal orders as follows:  1 time(s) weekly for 6 week(s).  Verbal orders given per [facility] protocol  Talbot Grumbling, RN

## 2020-12-24 NOTE — Telephone Encounter (Signed)
This patient is being referred for possible Parkinson's disease. Looks like they diagnosed him after a hospital stay at [facility]. Patient has a hx of stroke and has progressively worsening gait and instability. He would like to switch his care from Dr. Rexene Alberts to Dr. Krista Blue. Could you please review and advise if this is okay? Thank you!

## 2020-12-26 DIAGNOSIS — K219 Gastro-esophageal reflux disease without esophagitis: Secondary | ICD-10-CM | POA: Diagnosis not present

## 2020-12-26 DIAGNOSIS — Z955 Presence of coronary angioplasty implant and graft: Secondary | ICD-10-CM | POA: Diagnosis not present

## 2020-12-26 DIAGNOSIS — I69354 Hemiplegia and hemiparesis following cerebral infarction affecting left non-dominant side: Secondary | ICD-10-CM | POA: Diagnosis not present

## 2020-12-26 DIAGNOSIS — I10 Essential (primary) hypertension: Secondary | ICD-10-CM | POA: Diagnosis not present

## 2020-12-26 DIAGNOSIS — I252 Old myocardial infarction: Secondary | ICD-10-CM | POA: Diagnosis not present

## 2020-12-26 DIAGNOSIS — I4891 Unspecified atrial fibrillation: Secondary | ICD-10-CM | POA: Diagnosis not present

## 2020-12-26 DIAGNOSIS — W19XXXD Unspecified fall, subsequent encounter: Secondary | ICD-10-CM | POA: Diagnosis not present

## 2020-12-28 ENCOUNTER — Other Ambulatory Visit: Payer: Self-pay | Admitting: Family Medicine

## 2020-12-29 NOTE — Telephone Encounter (Signed)
Apparently already refilled by Bevelyn Buckles per the note.

## 2020-12-30 ENCOUNTER — Telehealth: Payer: Self-pay | Admitting: *Deleted

## 2020-12-30 DIAGNOSIS — I252 Old myocardial infarction: Secondary | ICD-10-CM | POA: Diagnosis not present

## 2020-12-30 DIAGNOSIS — I10 Essential (primary) hypertension: Secondary | ICD-10-CM | POA: Diagnosis not present

## 2020-12-30 DIAGNOSIS — Z955 Presence of coronary angioplasty implant and graft: Secondary | ICD-10-CM | POA: Diagnosis not present

## 2020-12-30 DIAGNOSIS — I69354 Hemiplegia and hemiparesis following cerebral infarction affecting left non-dominant side: Secondary | ICD-10-CM | POA: Diagnosis not present

## 2020-12-30 DIAGNOSIS — K219 Gastro-esophageal reflux disease without esophagitis: Secondary | ICD-10-CM | POA: Diagnosis not present

## 2020-12-30 DIAGNOSIS — I4891 Unspecified atrial fibrillation: Secondary | ICD-10-CM | POA: Diagnosis not present

## 2020-12-30 DIAGNOSIS — W19XXXD Unspecified fall, subsequent encounter: Secondary | ICD-10-CM | POA: Diagnosis not present

## 2020-12-30 NOTE — Telephone Encounter (Signed)
Called pt in reference to the message we received; clarified with pt what he needed. Pt states per the discharge instructions from the Hospital he should start his Warfarin on 01/02/2021 and his wife read the dose back and states it states to take 1 tablet (7.'5mg'$ ) Sunday & 1/2 tablet (3.'75mg'$ ) daily. Advised he should follow those instructions since were the last ones to treat him and he verbalized understanding. Also, pt states he has a new diagnosis of Parkinson's and meds started and will have a new Neurologist.   Educated pt that we will need to check his INR within a week after being restarted on Warfarin and set an appt for 01/08/2021 at 3pm.

## 2021-01-05 ENCOUNTER — Other Ambulatory Visit: Payer: Self-pay | Admitting: Gastroenterology

## 2021-01-05 DIAGNOSIS — K219 Gastro-esophageal reflux disease without esophagitis: Secondary | ICD-10-CM

## 2021-01-06 ENCOUNTER — Ambulatory Visit (INDEPENDENT_AMBULATORY_CARE_PROVIDER_SITE_OTHER): Payer: Medicare HMO | Admitting: Family Medicine

## 2021-01-06 ENCOUNTER — Encounter: Payer: Self-pay | Admitting: Family Medicine

## 2021-01-06 ENCOUNTER — Telehealth: Payer: Self-pay

## 2021-01-06 ENCOUNTER — Other Ambulatory Visit: Payer: Self-pay

## 2021-01-06 VITALS — BP 154/86 | HR 66 | Ht 72.0 in | Wt 178.2 lb

## 2021-01-06 DIAGNOSIS — I69354 Hemiplegia and hemiparesis following cerebral infarction affecting left non-dominant side: Secondary | ICD-10-CM | POA: Diagnosis not present

## 2021-01-06 DIAGNOSIS — G259 Extrapyramidal and movement disorder, unspecified: Secondary | ICD-10-CM

## 2021-01-06 DIAGNOSIS — W19XXXD Unspecified fall, subsequent encounter: Secondary | ICD-10-CM | POA: Diagnosis not present

## 2021-01-06 DIAGNOSIS — Z955 Presence of coronary angioplasty implant and graft: Secondary | ICD-10-CM | POA: Diagnosis not present

## 2021-01-06 DIAGNOSIS — K219 Gastro-esophageal reflux disease without esophagitis: Secondary | ICD-10-CM | POA: Diagnosis not present

## 2021-01-06 DIAGNOSIS — I10 Essential (primary) hypertension: Secondary | ICD-10-CM | POA: Diagnosis not present

## 2021-01-06 DIAGNOSIS — I4891 Unspecified atrial fibrillation: Secondary | ICD-10-CM | POA: Diagnosis not present

## 2021-01-06 DIAGNOSIS — Z1331 Encounter for screening for depression: Secondary | ICD-10-CM

## 2021-01-06 DIAGNOSIS — E041 Nontoxic single thyroid nodule: Secondary | ICD-10-CM | POA: Diagnosis not present

## 2021-01-06 DIAGNOSIS — I252 Old myocardial infarction: Secondary | ICD-10-CM | POA: Diagnosis not present

## 2021-01-06 NOTE — Telephone Encounter (Signed)
Patient calls nurse line requesting order for new wheelchair. Patient reports that he has been using wheelchair that was given to him by a friend, however, this has completely broken. Patient states that due to his Parkinson's disease he is in desperate need of new wheelchair.   Informed patient that insurance requires face to face office visit to discuss medical necessity of wheelchair.   Patient had previously scheduled for 8/12, however, states that he cannot wait this long for new wheelchair.   PCP had opening in schedule for this afternoon at 2:45. Patient scheduled for this appointment.   Talbot Grumbling, RN

## 2021-01-06 NOTE — Progress Notes (Signed)
    SUBJECTIVE:   CHIEF COMPLAINT / HPI:   Movement Disorder/Needs Wheelchair Patient with worsening gait instability and frequent falls over the past ~1 year. Has undergone neurologic workup and recently diagnosed with movement disorder- thought to be Parkinson-like. Also determined to have paraspinal muscle hematoma during recent admission to [facility]. He was started on Sinemet TID which is slightly helpful. He is still severely limited in his ADLs. Working with home PT/OT several days per week.  Patient needs a wheelchair. Previously borrowing one from a friend but it is broken and also does not fit through certain doors in his house. Patient able to propel himself in a wheelchair if it's light enough. Wife measured their narrowest door at home which is 26 inches wide.   PERTINENT  PMH / PSH: a-fib, CVA, CAD, COPD, HTN  OBJECTIVE:   BP (!) 154/86   Pulse 66   Ht 6' (1.829 m)   Wt 178 lb 3.2 oz (80.8 kg)   SpO2 96%   BMI 24.17 kg/m   General: NAD, pleasant Respiratory: No respiratory distress Skin: warm and dry, no rashes noted Psych: Normal affect and mood Neuro: wheelchair bound, normal ROM and strength in upper extremities, CN grossly intact  ASSESSMENT/PLAN:   Suspected Movement disorder- possibly PD  Patient unfortunately has lost most of his independence secondary to his movement disorder and frequent falls. Is now predominately wheelchair bound and requires assistance with some ADLs. -DME orders placed for new wheelchair. Will need to be light weight for him to self-propel. Also less than 26 inches wide to navigate his doorways at home. -Continue Sinemet, home PT/OT -Has upcoming follow up with [facility] neurology  Thyroid nodule Noted on prior exam (7/18) with Dr. Owens Shark. TSH is normal. He needs ultrasound at some point although defers at this time. He and his wife have multiple upcoming appointments with several specialists and are adjusting to his lower level of independence.  Will address as appropriate at future appointment.  Positive depression screening PHQ-9 elevated to 9 at today's visit with a 1 on question #9. Patient denies active SI or plan. Admits with all his recent health issues and his current age, he would be fine with not waking up. States he has been "melancholy" all his life. Takes duloxetine '40mg'$  but does not feel it provides any improvement in his mood. Tried '60mg'$  in the past and had adverse reaction (hallucinations, insomnia, etc). Suspect MDD vs adjustment disorder in the setting of his health problems -Recommended counseling/therapy. He will consider it, although they are overwhelmed with appointments at this time. -Will discuss possible medication adjustments at upcoming appt on 8/12   [name], MD [place]

## 2021-01-06 NOTE — Patient Instructions (Addendum)
It was great to meet you!  Things we discussed at today's visit: - I have ordered a new wheelchair for you. Give [place] a few days to process the order and iron out the details, but someone will be in touch soon about it. - I will look through your chart some more and talk to my supervising doctor/pharmacist about adjusting your medications to help with your mood. We can also consider therapy/counseling and will discuss more at your visit on August 12th. - We will also consider a urology referral in the future for your frequent urination.   Seek immediate care sooner if you develop any concerns.  Dr. Edrick Kins [facility]

## 2021-01-08 ENCOUNTER — Telehealth: Payer: Self-pay

## 2021-01-08 ENCOUNTER — Ambulatory Visit: Payer: Medicare HMO

## 2021-01-08 ENCOUNTER — Other Ambulatory Visit: Payer: Self-pay

## 2021-01-08 DIAGNOSIS — R002 Palpitations: Secondary | ICD-10-CM | POA: Diagnosis not present

## 2021-01-08 DIAGNOSIS — I480 Paroxysmal atrial fibrillation: Secondary | ICD-10-CM | POA: Diagnosis not present

## 2021-01-08 DIAGNOSIS — Z7901 Long term (current) use of anticoagulants: Secondary | ICD-10-CM | POA: Diagnosis not present

## 2021-01-08 DIAGNOSIS — Z1331 Encounter for screening for depression: Secondary | ICD-10-CM | POA: Insufficient documentation

## 2021-01-08 LAB — POCT INR: INR: 1.3 — AB (ref 2.0–3.0)

## 2021-01-08 NOTE — Telephone Encounter (Signed)
Received below message from [employer].   Received! Thanks!   Talbot Grumbling, RN

## 2021-01-08 NOTE — Patient Instructions (Signed)
Description   Take 1 1/2 tablets tonight and then continue taking 1/2 tablet daily except for 1 tablet on Sundays and Thursdays.  Recheck INR in 1 week. Coumadin Clinic [phone].

## 2021-01-08 NOTE — Addendum Note (Signed)
Addended by: [name] on: 01/08/2021 06:13 AM   Modules accepted: Orders

## 2021-01-08 NOTE — Assessment & Plan Note (Signed)
PHQ-9 elevated to 9 at today's visit with a 1 on question #9. Patient denies active SI or plan. Admits with all his recent health issues and his current age, he would be fine with not waking up. States he has been "melancholy" all his life. Takes duloxetine '40mg'$  but does not feel it provides any improvement in his mood. Tried '60mg'$  in the past and had adverse reaction (hallucinations, insomnia, etc). Suspect MDD vs adjustment disorder in the setting of his health problems -Recommended counseling/therapy. He will consider it, although they are overwhelmed with appointments at this time. -Will discuss possible medication adjustments at upcoming appt on 8/12

## 2021-01-08 NOTE — Assessment & Plan Note (Signed)
Noted on prior exam (7/18) with Dr. Owens Shark. TSH is normal. He needs ultrasound at some point although defers at this time. He and his wife have multiple upcoming appointments with several specialists and are adjusting to his lower level of independence. Will address as appropriate at future appointment.

## 2021-01-08 NOTE — Telephone Encounter (Signed)
Community message sent to [employer] for manual wheelchair. Will await response.   Talbot Grumbling, RN

## 2021-01-08 NOTE — Assessment & Plan Note (Signed)
Patient unfortunately has lost most of his independence secondary to his movement disorder and frequent falls. Is now predominately wheelchair bound and requires assistance with some ADLs. -DME orders placed for new wheelchair. Will need to be light weight for him to self-propel. Also less than 26 inches wide to navigate his doorways at home. -Continue Sinemet, home PT/OT -Has upcoming follow up with [facility] neurology

## 2021-01-09 DIAGNOSIS — R296 Repeated falls: Secondary | ICD-10-CM | POA: Diagnosis not present

## 2021-01-09 DIAGNOSIS — R269 Unspecified abnormalities of gait and mobility: Secondary | ICD-10-CM | POA: Diagnosis not present

## 2021-01-09 DIAGNOSIS — W19XXXD Unspecified fall, subsequent encounter: Secondary | ICD-10-CM | POA: Diagnosis not present

## 2021-01-09 NOTE — Telephone Encounter (Signed)
Noted thanks °

## 2021-01-09 NOTE — Telephone Encounter (Signed)
Pt called, would like a call from the nurse to discuss transferring to another physician in your practice.

## 2021-01-09 NOTE — Telephone Encounter (Signed)
I called and left message for patient. I had spoken to [employer] at referring practice about this referral and the patient had opted to f/u with [facility] neurology. Left message on VM for patient again today letting him know of the denial of provider switch and to give [place] a call if he would like to set up an appointment with Dr. Rexene Alberts. Please send patient to me if he calls back. Thank you

## 2021-01-10 DIAGNOSIS — G4733 Obstructive sleep apnea (adult) (pediatric): Secondary | ICD-10-CM | POA: Diagnosis not present

## 2021-01-10 DIAGNOSIS — W19XXXD Unspecified fall, subsequent encounter: Secondary | ICD-10-CM | POA: Diagnosis not present

## 2021-01-10 DIAGNOSIS — I4891 Unspecified atrial fibrillation: Secondary | ICD-10-CM | POA: Diagnosis not present

## 2021-01-10 DIAGNOSIS — I252 Old myocardial infarction: Secondary | ICD-10-CM | POA: Diagnosis not present

## 2021-01-10 DIAGNOSIS — I10 Essential (primary) hypertension: Secondary | ICD-10-CM | POA: Diagnosis not present

## 2021-01-10 DIAGNOSIS — R296 Repeated falls: Secondary | ICD-10-CM | POA: Diagnosis not present

## 2021-01-10 DIAGNOSIS — Z955 Presence of coronary angioplasty implant and graft: Secondary | ICD-10-CM | POA: Diagnosis not present

## 2021-01-10 DIAGNOSIS — K219 Gastro-esophageal reflux disease without esophagitis: Secondary | ICD-10-CM | POA: Diagnosis not present

## 2021-01-10 DIAGNOSIS — I69354 Hemiplegia and hemiparesis following cerebral infarction affecting left non-dominant side: Secondary | ICD-10-CM | POA: Diagnosis not present

## 2021-01-10 DIAGNOSIS — R69 Illness, unspecified: Secondary | ICD-10-CM | POA: Diagnosis not present

## 2021-01-12 ENCOUNTER — Encounter: Payer: Self-pay | Admitting: Physician Assistant

## 2021-01-12 NOTE — Progress Notes (Deleted)
Cardiology Office Note    Date:  01/12/2021   ID:  [name], DOB 1946/04/13, MRN [id number]  PCP:  [name], MD  Cardiologist:  Larae Grooms, MD  Electrophysiologist:  None   Chief Complaint: ***  History of Present Illness:   [name] is a 75 y.o. male with history of CAD status post PCI to the LCx in 2007, PAF on amiodarone and Coumadin, prior stroke/TIA, ETOH abuse, COPD, mildly elevated PASP 2021, depression with chronic fatigue, GERD, HTN, HLD, OSA who presents for follow-up. He was a prior patient of Dr. Rockey Situ and more recently Dr. Irish Lack. Last echo 09/2019 EF 60-65%, grade 1 DD, mildly elevated PASP, mild BAE. Cardiac cath 01/2020 for chest pain showed no significant CAD (10% Cx, otherwise normal). He was recently admitted to [facility] in July 2022 with 7 month history of falls/gait instability and ultimately felt to possibly have Parkinson's disease. He also had back pain from a fall with CT abdomen/pelvis: 9.7 x 3.1 x 11.4 cm left paraspinous intramuscular hematoma so warfarin was held (INR 3.27 on admission). Per notes, patient was instructed to restart warfarin 2 weeks after discharge - was seen back in Coumadin clinic 01/08/21 to restart  warfarin plus ASA?  INR 3.27 on mild PASP Need cbc later on to f/u anemia On pcsk9 and statin   Paroxysmal atrial fibrillation with recent hematoma and mild associated ABL anemia CAD with HLD goal LDL <70 Gait instability Essential HTN  Labwork independently reviewed: 01/08/21 INR 1.3 12/2020 ferritin 126, K 4.2, CMET OK Cr 1.05, glu 101, K 4.2, Hgb 11.9, TSH wnl 10/2020 LDL wnl  Past Medical History:  Diagnosis Date   BPH (benign prostatic hyperplasia)    COPD (chronic obstructive pulmonary disease) ([place]) 2021   Coronary artery disease    a. s/p PCI to LCx in 2007; b. MV 2017 no sig ischemia, EF 59%, nl study   Depression    GERD (gastroesophageal reflux disease)    Hyperlipidemia    Hypertension    IBS  (irritable bowel syndrome)    Myocardial infarction ([place])    12-13 yrs ago    PAF (paroxysmal atrial fibrillation) ([place])    a. CHADS2VASc => 5 (HTN, age x 1, stroke x 2, vascular disease); b. on Coumadin    PNA (pneumonia)    Skin cancer    face    Sleep apnea    off cpap    Stroke ([place])    a. x 2 with residual left-sided weakness    Past Surgical History:  Procedure Laterality Date   [place]  2006   X1 STENT   COLONOSCOPY     CORONARY ANGIOPLASTY     POLYPECTOMY     RIGHT/LEFT HEART CATH AND CORONARY ANGIOGRAPHY N/A 01/25/2020   Procedure: RIGHT/LEFT HEART CATH AND CORONARY ANGIOGRAPHY;  Surgeon: Minna Merritts, MD;  Location: [place] [facility];  Service: Cardiovascular;  Laterality: N/A;   SKIN CANCER EXCISION N/A 08/2016   [place].  [facility] (Dr. Deliah Boston)   THYROIDECTOMY, PARTIAL     UPPER GASTROINTESTINAL ENDOSCOPY     VASCULAR SURGERY     varicose vein stripping left leg    Current Medications: No outpatient medications have been marked as taking for the 01/13/21 encounter (Appointment) with Charlie Pitter, PA-C.   ***   Allergies:   Bactrim [sulfamethoxazole-trimethoprim], Ciprofloxacin, and Crestor [rosuvastatin calcium]   Social History   Socioeconomic History  Marital status: Married    Spouse name: Not on file   Number of children: 1   Years of education: Not on file   Highest education level: Not on file  Occupational History   Occupation: retired  Tobacco Use   Smoking status: Former    Years: 20.00    Types: Cigarettes   Smokeless tobacco: Never  Vaping Use   Vaping Use: Never used  Substance and Sexual Activity   Alcohol use: Yes    Alcohol/week: 2.0 - 3.0 standard drinks    Types: 2 - 3 Glasses of wine per week    Comment: Wine daily with dinner most days   Drug use: No   Sexual activity: Never  Other Topics Concern   Not on file  Social History Narrative   Not on file    Social Determinants of Health   Financial Resource Strain: Not on file  Food Insecurity: Not on file  Transportation Needs: Not on file  Physical Activity: Not on file  Stress: Not on file  Social Connections: Not on file     Family History:  The patient's ***family history includes Arthritis in his mother; Cancer in his maternal aunt and mother; Diabetes in his father; Heart Problems in his father; Heart disease in his father, paternal grandfather, and paternal grandmother; Hypertension in his father; Mental illness in his mother; Stroke in his maternal grandmother and paternal uncle; Sudden death in his maternal grandfather. There is no history of Colon cancer, Esophageal cancer, Rectal cancer, Colon polyps, or Stomach cancer.  ROS:   Please see the history of present illness. Otherwise, review of systems is positive for ***.  All other systems are reviewed and otherwise negative.    EKGs/Labs/Other Studies Reviewed:    Studies reviewed are outlined and summarized above. Reports included below if pertinent.  Cath 01/2020 Prox Cx to Mid Cx lesion is 10% stenosed. The left ventricular systolic function is normal. LV end diastolic pressure is normal. The left ventricular ejection fraction is 55-65% by visual estimate. There is no mitral valve regurgitation.   2D echo 09/2019   1. Left ventricular ejection fraction, by estimation, is 60 to 65%. The  left ventricle has normal function. The left ventricle has no regional  wall motion abnormalities. Left ventricular diastolic parameters are  consistent with Grade I diastolic  dysfunction (impaired relaxation).   2. Right ventricular systolic function is normal. The right ventricular  size is normal. There is mildly elevated pulmonary artery systolic  pressure. The estimated right ventricular systolic pressure is 0000000 mmHg.   3. Left atrial size was mildly dilated.   4. Right atrial size was mildly dilated.     EKG:  EKG is  ordered today, personally reviewed, demonstrating ***  Recent Labs: 05/08/2020: B Natriuretic Peptide 178.9 08/16/2020: TSH 2.080 10/16/2020: Magnesium 2.0 12/23/2020: ALT 11; BUN 15; Creatinine, Ser 1.05; Hemoglobin 11.9; Platelets 288; Potassium 4.2; Sodium 138  Recent Lipid Panel    Component Value Date/Time   CHOL 227 (H) 03/14/2020 0924   TRIG 250 (H) 03/14/2020 0924   HDL 46 03/14/2020 0924   CHOLHDL 4.9 03/14/2020 0924   VLDL 50 (H) 03/14/2020 0924   LDLCALC 131 (H) 03/14/2020 0924   LDLCALC 67 07/14/2019 1439   LDLDIRECT 64 10/16/2020 1530    PHYSICAL EXAM:    VS:  There were no vitals taken for this visit.  BMI: There is no height or weight on file to calculate BMI.  GEN: Well nourished,  well developed male in no acute distress HEENT: normocephalic, atraumatic Neck: no JVD, carotid bruits, or masses Cardiac: ***RRR; no murmurs, rubs, or gallops, no edema  Respiratory:  clear to auscultation bilaterally, normal work of breathing GI: soft, nontender, nondistended, + BS MS: no deformity or atrophy Skin: warm and dry, no rash Neuro:  Alert and Oriented x 3, Strength and sensation are intact, follows commands Psych: euthymic mood, full affect  Wt Readings from Last 3 Encounters:  01/06/21 178 lb 3.2 oz (80.8 kg)  12/23/20 177 lb 9.6 oz (80.6 kg)  11/26/20 180 lb (81.6 kg)     ASSESSMENT & PLAN:   ***     Disposition: F/u with ***   Medication Adjustments/Labs and Tests Ordered: Current medicines are reviewed at length with the patient today.  Concerns regarding medicines are outlined above. Medication changes, Labs and Tests ordered today are summarized above and listed in the Patient Instructions accessible in Encounters.   Signed, Charlie Pitter, PA-C  01/12/2021 8:34 AM    [address], [place], [place]  [address] Phone: [phone]; Fax: [phone]

## 2021-01-13 ENCOUNTER — Observation Stay (HOSPITAL_COMMUNITY)
Admission: EM | Admit: 2021-01-13 | Discharge: 2021-01-14 | Disposition: A | Discharge status: Home / Self Care | Payer: Medicare HMO | Attending: Family Medicine | Admitting: Family Medicine

## 2021-01-13 ENCOUNTER — Emergency Department (HOSPITAL_COMMUNITY): Payer: Medicare HMO

## 2021-01-13 ENCOUNTER — Observation Stay (HOSPITAL_COMMUNITY): Payer: Medicare HMO

## 2021-01-13 ENCOUNTER — Ambulatory Visit: Payer: Medicare HMO | Admitting: Physician Assistant

## 2021-01-13 ENCOUNTER — Telehealth: Payer: Self-pay | Admitting: Interventional Cardiology

## 2021-01-13 DIAGNOSIS — R109 Unspecified abdominal pain: Secondary | ICD-10-CM | POA: Diagnosis not present

## 2021-01-13 DIAGNOSIS — I10 Essential (primary) hypertension: Secondary | ICD-10-CM | POA: Insufficient documentation

## 2021-01-13 DIAGNOSIS — Z87891 Personal history of nicotine dependence: Secondary | ICD-10-CM | POA: Diagnosis not present

## 2021-01-13 DIAGNOSIS — Z7982 Long term (current) use of aspirin: Secondary | ICD-10-CM | POA: Insufficient documentation

## 2021-01-13 DIAGNOSIS — Z79899 Other long term (current) drug therapy: Secondary | ICD-10-CM | POA: Insufficient documentation

## 2021-01-13 DIAGNOSIS — I480 Paroxysmal atrial fibrillation: Secondary | ICD-10-CM | POA: Insufficient documentation

## 2021-01-13 DIAGNOSIS — I2510 Atherosclerotic heart disease of native coronary artery without angina pectoris: Secondary | ICD-10-CM

## 2021-01-13 DIAGNOSIS — K6389 Other specified diseases of intestine: Secondary | ICD-10-CM | POA: Diagnosis not present

## 2021-01-13 DIAGNOSIS — Z20822 Contact with and (suspected) exposure to covid-19: Secondary | ICD-10-CM | POA: Insufficient documentation

## 2021-01-13 DIAGNOSIS — K56609 Unspecified intestinal obstruction, unspecified as to partial versus complete obstruction: Secondary | ICD-10-CM

## 2021-01-13 DIAGNOSIS — J449 Chronic obstructive pulmonary disease, unspecified: Secondary | ICD-10-CM | POA: Diagnosis not present

## 2021-01-13 DIAGNOSIS — R1084 Generalized abdominal pain: Secondary | ICD-10-CM | POA: Diagnosis not present

## 2021-01-13 DIAGNOSIS — E785 Hyperlipidemia, unspecified: Secondary | ICD-10-CM

## 2021-01-13 DIAGNOSIS — Z7901 Long term (current) use of anticoagulants: Secondary | ICD-10-CM | POA: Diagnosis not present

## 2021-01-13 DIAGNOSIS — R100 Acute abdomen: Secondary | ICD-10-CM | POA: Insufficient documentation

## 2021-01-13 DIAGNOSIS — R1111 Vomiting without nausea: Secondary | ICD-10-CM | POA: Diagnosis not present

## 2021-01-13 DIAGNOSIS — Z85828 Personal history of other malignant neoplasm of skin: Secondary | ICD-10-CM | POA: Diagnosis not present

## 2021-01-13 DIAGNOSIS — Z0189 Encounter for other specified special examinations: Secondary | ICD-10-CM

## 2021-01-13 DIAGNOSIS — R2681 Unsteadiness on feet: Secondary | ICD-10-CM

## 2021-01-13 DIAGNOSIS — R001 Bradycardia, unspecified: Secondary | ICD-10-CM | POA: Diagnosis not present

## 2021-01-13 DIAGNOSIS — R110 Nausea: Secondary | ICD-10-CM | POA: Diagnosis not present

## 2021-01-13 DIAGNOSIS — R61 Generalized hyperhidrosis: Secondary | ICD-10-CM | POA: Diagnosis not present

## 2021-01-13 DIAGNOSIS — T148XXA Other injury of unspecified body region, initial encounter: Secondary | ICD-10-CM

## 2021-01-13 DIAGNOSIS — R140 Abdominal distension (gaseous): Secondary | ICD-10-CM | POA: Diagnosis not present

## 2021-01-13 DIAGNOSIS — K5730 Diverticulosis of large intestine without perforation or abscess without bleeding: Secondary | ICD-10-CM | POA: Diagnosis not present

## 2021-01-13 DIAGNOSIS — K56600 Partial intestinal obstruction, unspecified as to cause: Secondary | ICD-10-CM | POA: Diagnosis not present

## 2021-01-13 DIAGNOSIS — K529 Noninfective gastroenteritis and colitis, unspecified: Secondary | ICD-10-CM | POA: Diagnosis not present

## 2021-01-13 DIAGNOSIS — J9811 Atelectasis: Secondary | ICD-10-CM | POA: Diagnosis not present

## 2021-01-13 LAB — COMPREHENSIVE METABOLIC PANEL
ALT: 15 U/L (ref 0–44)
AST: 21 U/L (ref 15–41)
Albumin: 3.7 g/dL (ref 3.5–5.0)
Alkaline Phosphatase: 94 U/L (ref 38–126)
Anion gap: 7 (ref 5–15)
BUN: 13 mg/dL (ref 8–23)
CO2: 29 mmol/L (ref 22–32)
Calcium: 9.4 mg/dL (ref 8.9–10.3)
Chloride: 102 mmol/L (ref 98–111)
Creatinine, Ser: 1.28 mg/dL — ABNORMAL HIGH (ref 0.61–1.24)
GFR, Estimated: 59 mL/min — ABNORMAL LOW (ref >60)
Glucose, Bld: 164 mg/dL — ABNORMAL HIGH (ref 70–99)
Potassium: 4.2 mmol/L (ref 3.5–5.1)
Sodium: 138 mmol/L (ref 135–145)
Total Bilirubin: 0.6 mg/dL (ref 0.3–1.2)
Total Protein: 7 g/dL (ref 6.5–8.1)

## 2021-01-13 LAB — CBC WITH DIFFERENTIAL/PLATELET
Abs Immature Granulocytes: 0.03 10*3/uL (ref 0.00–0.07)
Basophils Absolute: 0 10*3/uL (ref 0.0–0.1)
Basophils Relative: 0 %
Eosinophils Absolute: 0.1 10*3/uL (ref 0.0–0.5)
Eosinophils Relative: 1 %
HCT: 44.9 % (ref 39.0–52.0)
Hemoglobin: 14.9 g/dL (ref 13.0–17.0)
Immature Granulocytes: 0 %
Lymphocytes Relative: 8 %
Lymphs Abs: 0.9 10*3/uL (ref 0.7–4.0)
MCH: 31.5 pg (ref 26.0–34.0)
MCHC: 33.2 g/dL (ref 30.0–36.0)
MCV: 94.9 fL (ref 80.0–100.0)
Monocytes Absolute: 0.8 10*3/uL (ref 0.1–1.0)
Monocytes Relative: 7 %
Neutro Abs: 10.3 10*3/uL — ABNORMAL HIGH (ref 1.7–7.7)
Neutrophils Relative %: 84 %
Platelets: 222 10*3/uL (ref 150–400)
RBC: 4.73 MIL/uL (ref 4.22–5.81)
RDW: 14 % (ref 11.5–15.5)
WBC: 12.2 10*3/uL — ABNORMAL HIGH (ref 4.0–10.5)
nRBC: 0 % (ref 0.0–0.2)

## 2021-01-13 LAB — LIPASE, BLOOD: Lipase: 28 U/L (ref 11–51)

## 2021-01-13 LAB — PROTIME-INR
INR: 2.4 — ABNORMAL HIGH (ref 0.8–1.2)
Prothrombin Time: 25.8 seconds — ABNORMAL HIGH (ref 11.4–15.2)

## 2021-01-13 LAB — URINALYSIS, ROUTINE W REFLEX MICROSCOPIC
Bilirubin Urine: NEGATIVE
Glucose, UA: NEGATIVE mg/dL
Hgb urine dipstick: NEGATIVE
Ketones, ur: NEGATIVE mg/dL
Leukocytes,Ua: NEGATIVE
Nitrite: NEGATIVE
Protein, ur: NEGATIVE mg/dL
Specific Gravity, Urine: >1.046 — ABNORMAL HIGH (ref 1.005–1.030)
pH: 5 (ref 5.0–8.0)

## 2021-01-13 LAB — SARS CORONAVIRUS 2 (TAT 6-24 HRS): SARS Coronavirus 2: NEGATIVE

## 2021-01-13 MED ORDER — IOHEXOL 350 MG/ML SOLN
100.0000 mL | Freq: Once | INTRAVENOUS | Status: AC | PRN
  Administered 2021-01-13: 100 mL via INTRAVENOUS

## 2021-01-13 MED ORDER — ONDANSETRON HCL 4 MG/2ML IJ SOLN
4.0000 mg | Freq: Once | INTRAMUSCULAR | Status: AC
  Administered 2021-01-13: 4 mg via INTRAVENOUS
  Filled 2021-01-13: qty 2

## 2021-01-13 MED ORDER — BISOPROLOL FUMARATE 5 MG PO TABS
15.0000 mg | ORAL_TABLET | Freq: Every day | ORAL | Status: DC
  Administered 2021-01-13: 15 mg via ORAL
  Filled 2021-01-13: qty 3
  Filled 2021-01-13: qty 1

## 2021-01-13 MED ORDER — AMIODARONE HCL 200 MG PO TABS
200.0000 mg | ORAL_TABLET | Freq: Every day | ORAL | Status: DC
  Administered 2021-01-13 – 2021-01-14 (×2): 200 mg via ORAL
  Filled 2021-01-13 (×2): qty 1

## 2021-01-13 MED ORDER — ADULT MULTIVITAMIN W/MINERALS CH
1.0000 | ORAL_TABLET | Freq: Every day | ORAL | Status: DC
  Administered 2021-01-13 – 2021-01-14 (×2): 1 via ORAL
  Filled 2021-01-13 (×2): qty 1

## 2021-01-13 MED ORDER — DULOXETINE HCL 20 MG PO CPEP
20.0000 mg | ORAL_CAPSULE | Freq: Two times a day (BID) | ORAL | Status: DC
  Filled 2021-01-13: qty 1

## 2021-01-13 MED ORDER — WARFARIN SODIUM 7.5 MG PO TABS: 7.5000 mg | ORAL_TABLET | ORAL | Status: DC

## 2021-01-13 MED ORDER — OXYMETAZOLINE HCL 0.05 % NA SOLN
1.0000 | Freq: Once | NASAL | Status: AC
  Administered 2021-01-13: 1 via NASAL
  Filled 2021-01-13: qty 30

## 2021-01-13 MED ORDER — LIDOCAINE HCL URETHRAL/MUCOSAL 2 % EX GEL
1.0000 "application " | Freq: Once | CUTANEOUS | Status: DC
  Filled 2021-01-13: qty 11

## 2021-01-13 MED ORDER — WARFARIN - PHARMACIST DOSING INPATIENT: Freq: Every day | Status: DC

## 2021-01-13 MED ORDER — SODIUM CHLORIDE 0.9 % IV SOLN: INTRAVENOUS | Status: DC

## 2021-01-13 MED ORDER — TIZANIDINE HCL 2 MG PO TABS: 4.0000 mg | ORAL_TABLET | Freq: Every evening | ORAL | Status: DC | PRN

## 2021-01-13 MED ORDER — ASPIRIN EC 81 MG PO TBEC
81.0000 mg | DELAYED_RELEASE_TABLET | Freq: Every day | ORAL | Status: DC
  Administered 2021-01-13: 81 mg via ORAL
  Filled 2021-01-13: qty 1

## 2021-01-13 MED ORDER — MORPHINE SULFATE (PF) 4 MG/ML IV SOLN
4.0000 mg | Freq: Once | INTRAVENOUS | Status: AC
  Administered 2021-01-13: 4 mg via INTRAVENOUS
  Filled 2021-01-13: qty 1

## 2021-01-13 MED ORDER — DULOXETINE HCL 20 MG PO CPEP
40.0000 mg | ORAL_CAPSULE | Freq: Every day | ORAL | Status: DC
  Administered 2021-01-13 – 2021-01-14 (×2): 40 mg via ORAL
  Filled 2021-01-13 (×2): qty 2

## 2021-01-13 MED ORDER — DIATRIZOATE MEGLUMINE & SODIUM 66-10 % PO SOLN
90.0000 mL | Freq: Once | ORAL | Status: AC
  Administered 2021-01-13: 90 mL via ORAL
  Filled 2021-01-13: qty 90

## 2021-01-13 MED ORDER — WARFARIN SODIUM 2.5 MG PO TABS
3.7500 mg | ORAL_TABLET | ORAL | Status: DC
  Administered 2021-01-13 – 2021-01-14 (×2): 3.75 mg via ORAL
  Filled 2021-01-13 (×2): qty 1

## 2021-01-13 MED ORDER — PRAVASTATIN SODIUM 40 MG PO TABS
80.0000 mg | ORAL_TABLET | Freq: Every day | ORAL | Status: DC
  Administered 2021-01-13 – 2021-01-14 (×2): 80 mg via ORAL
  Filled 2021-01-13 (×2): qty 2

## 2021-01-13 MED ORDER — CARBIDOPA-LEVODOPA 25-100 MG PO TABS
1.0000 | ORAL_TABLET | Freq: Three times a day (TID) | ORAL | Status: DC
  Administered 2021-01-13 – 2021-01-14 (×6): 1 via ORAL
  Filled 2021-01-13 (×6): qty 1

## 2021-01-13 MED ORDER — ACETAMINOPHEN 325 MG PO TABS
650.0000 mg | ORAL_TABLET | Freq: Four times a day (QID) | ORAL | Status: DC | PRN
  Administered 2021-01-13: 650 mg via ORAL
  Filled 2021-01-13: qty 2

## 2021-01-13 NOTE — ED Notes (Signed)
Patient transported to CT 

## 2021-01-13 NOTE — ED Provider Notes (Signed)
[place] EMERGENCY DEPARTMENT Provider Note   CSN: [id number] Arrival date & time: 01/13/21  0145     History No chief complaint on file.   [name] is a 75 y.o. male HTN, HLD, COPD, CAD, A fib on Xarelto who presents to the emergency department for evaluation of abdominal pain.  Patient states that he had a recent admission in July after frequent falls where he was found to have a spontaneous versus traumatic retroperitoneal hematoma.  He was ultimately discharged but awoke this morning with severe 10 of 10 abdominal pain in a bandlike distribution over the epigastrium with associated nausea but no vomiting.  Denies diaphoresis, chest pain, shortness of breath, fever or other systemic symptoms.  He presents concerned about a additional bleeding in his abdomen.  HPI     Past Medical History:  Diagnosis Date   BPH (benign prostatic hyperplasia)    COPD (chronic obstructive pulmonary disease) ([place]) 2021   Coronary artery disease    a. s/p PCI to LCx in 2007; b. MV 2017 no sig ischemia, EF 59%, nl study. c. Cath 01/2020 without significant recurrent dz.   Depression    GERD (gastroesophageal reflux disease)    Hyperlipidemia    Hypertension    IBS (irritable bowel syndrome)    Myocardial infarction ([place])    12-13 yrs ago    PAF (paroxysmal atrial fibrillation) ([place])    a. CHADS2VASc => 5 (HTN, age x 1, stroke x 2, vascular disease); b. on Coumadin    PNA (pneumonia)    Skin cancer    face    Sleep apnea    off cpap    Stroke [facility])    a. x 2 with residual left-sided weakness    Patient Active Problem List   Diagnosis Date Noted   Positive depression screening 01/08/2021   Thyroid nodule 12/23/2020   Suspected Movement disorder- possibly PD  12/22/2020   Hematoma- Paraspinal from Fall  12/22/2020   Frequent falls 11/13/2020   Orthostatic dizziness 08/28/2020   Chronic pain of both lower extremities 07/17/2020   COPD exacerbation ([place]) 05/08/2020    Statin myopathy 06/07/2019   Left-sided weakness 08/12/2018   Chronic obstructive pulmonary disease ([place]) 06/13/2018   Sleep apnea 09/16/2017   Testicular atrophy 04/23/2017   GERD (gastroesophageal reflux disease) 02/08/2015   IBS (irritable bowel syndrome) 02/08/2015   Essential hypertension 02/08/2015   AF (paroxysmal atrial fibrillation) ([place]) 01/23/2015   CAD (coronary artery disease) 01/23/2015   History of CVA (cerebrovascular accident) 01/23/2015    Past Surgical History:  Procedure Laterality Date   [place]  2006   X1 STENT   COLONOSCOPY     CORONARY ANGIOPLASTY     POLYPECTOMY     RIGHT/LEFT HEART CATH AND CORONARY ANGIOGRAPHY N/A 01/25/2020   Procedure: RIGHT/LEFT HEART CATH AND CORONARY ANGIOGRAPHY;  Surgeon: Minna Merritts, MD;  Location: [place] [facility];  Service: Cardiovascular;  Laterality: N/A;   SKIN CANCER EXCISION N/A 08/2016   [place].  [facility] (Dr. Deliah Boston)   THYROIDECTOMY, PARTIAL     UPPER GASTROINTESTINAL ENDOSCOPY     VASCULAR SURGERY     varicose vein stripping left leg       Family History  Problem Relation Age of Onset   Heart Problems Father    Heart disease Father    Hypertension Father    Diabetes Father    Mental illness Mother  Arthritis Mother    Cancer Mother        ovarian   Stroke Paternal Uncle    Heart disease Paternal Grandfather    Cancer Maternal Aunt    Stroke Maternal Grandmother    Sudden death Maternal Grandfather    Heart disease Paternal Grandmother    Colon cancer Neg Hx    Esophageal cancer Neg Hx    Rectal cancer Neg Hx    Colon polyps Neg Hx    Stomach cancer Neg Hx     Social History   Tobacco Use   Smoking status: Former    Years: 20.00    Types: Cigarettes   Smokeless tobacco: Never  Vaping Use   Vaping Use: Never used  Substance Use Topics   Alcohol use: Yes    Alcohol/week: 2.0 - 3.0 standard drinks    Types: 2 - 3  Glasses of wine per week    Comment: Wine daily with dinner most days   Drug use: No    Home Medications Prior to Admission medications   Medication Sig Start Date End Date Taking? Authorizing Provider  acetaminophen (TYLENOL) 500 MG tablet Take 1,000 mg by mouth every 6 (six) hours as needed for mild pain.    [provider]  Alirocumab (PRALUENT) 75 MG/ML SOAJ Inject 75 mg into the skin every 14 (fourteen) days. 07/09/20   Minna Merritts, MD  amiodarone (PACERONE) 200 MG tablet Take 1 tablet (200 mg total) by mouth daily. 11/25/20   Jettie Booze, MD  aspirin 81 MG tablet Take 81 mg by mouth at bedtime.     [provider]  bisoprolol (ZEBETA) 10 MG tablet Take 1.5 tablets (15 mg total) by mouth daily. 08/14/20   Zola Button, MD  carbidopa-levodopa (SINEMET IR) 25-100 MG tablet Take 1 tablet by mouth 3 (three) times daily. 12/23/20   Martyn Malay, MD  DULoxetine (CYMBALTA) 20 MG capsule Take 2 capsules (40 mg total) by mouth daily. 12/23/20 03/23/21  Martyn Malay, MD  fluticasone (FLONASE) 50 MCG/ACT nasal spray Place into both nostrils. 05/20/20   [provider]  hydrocortisone 2.5 % cream Apply topically daily as needed. 01/02/20   [provider]  lisinopril (ZESTRIL) 40 MG tablet TAKE 1 TABLET (40 MG TOTAL) BY MOUTH DAILY. FOR BLOOD PRESSURE. 09/25/19   Pleas Koch, NP  Multiple Vitamins-Minerals (MULTIVITAMIN WITH MINERALS) tablet Take 1 tablet by mouth daily. Centrum Silver    [provider]  nitroGLYCERIN (NITROSTAT) 0.4 MG SL tablet Place 1 tablet (0.4 mg total) every 5 (five) minutes as needed under the tongue for chest pain. 04/16/17   Leone Haven, MD  omeprazole (PRILOSEC) 40 MG capsule TAKE 1 CAPSULE (40 MG TOTAL) BY MOUTH 2 (TWO) TIMES DAILY BEFORE A MEAL. FOR HEARTBURN. 07/15/20   Thornton Park, MD  pravastatin (PRAVACHOL) 80 MG tablet Take 1 tablet (80 mg total) by mouth daily. For cholesterol. 03/22/20    Minna Merritts, MD  tiZANidine (ZANAFLEX) 4 MG tablet TAKE 1 TABLET BY MOUTH AT BEDTIME AS NEEDED FOR MUSCLE SPASMS. 12/31/20   [name], MD    Allergies    Bactrim [sulfamethoxazole-trimethoprim], Ciprofloxacin, and Crestor [rosuvastatin calcium]  Review of Systems   Review of Systems  Constitutional:  Negative for chills and fever.  HENT:  Negative for ear pain and sore throat.   Eyes:  Negative for pain and visual disturbance.  Respiratory:  Negative for cough and shortness of breath.  Cardiovascular:  Negative for chest pain and palpitations.  Gastrointestinal:  Positive for abdominal pain and nausea. Negative for vomiting.  Genitourinary:  Negative for dysuria and hematuria.  Musculoskeletal:  Negative for arthralgias and back pain.  Skin:  Negative for color change and rash.  Neurological:  Negative for seizures and syncope.  All other systems reviewed and are negative.  Physical Exam Updated Vital Signs There were no vitals taken for this visit.  Physical Exam Vitals and nursing note reviewed.  Constitutional:      Appearance: He is well-developed.  HENT:     Head: Normocephalic and atraumatic.  Eyes:     Conjunctiva/sclera: Conjunctivae normal.  Cardiovascular:     Rate and Rhythm: Normal rate and regular rhythm.     Heart sounds: No murmur heard. Pulmonary:     Effort: Pulmonary effort is normal. No respiratory distress.     Breath sounds: Normal breath sounds.  Abdominal:     General: There is distension.     Palpations: Abdomen is soft.     Tenderness: There is generalized abdominal tenderness and tenderness in the epigastric area.  Musculoskeletal:     Cervical back: Neck supple.  Skin:    General: Skin is warm and dry.  Neurological:     Mental Status: He is alert.    ED Results / Procedures / Treatments   Labs (all labs ordered are listed, but only abnormal results are displayed) Labs Reviewed - No data to  display  EKG None  Radiology No results found.  Procedures Procedures   Medications Ordered in ED Medications - No data to display  ED Course  I have reviewed the triage vital signs and the nursing notes.  Pertinent labs & imaging results that were available during my care of the patient were reviewed by me and considered in my medical decision making (see chart for details).    MDM Rules/Calculators/A&P                           Patient seen in the emergency department for evaluation of abdominal pain.  Physical exam reveals mild abdominal distention and generalized tenderness worse in the epigastrium.  Laboratory evaluation reveals a leukocytosis 12.2 and a creatinine elevation to 1.28 but is otherwise unremarkable.  A CT angiogram of the abdomen pelvis was obtained to rule out retroperitoneal hematoma.  The CT scan shows no active retroperitoneal hematoma that shows partial small bowel obstruction for small bowel ileus in the mid abdomen with no abrupt transition point.  There is also some superimposed mild inflammatory stranding in the small bowel.  Surgery was consulted who agreed with no NG tube placement at this time and medical admission for management of partial small bowel obstruction.  Patient then admitted to medicine. Final Clinical Impression(s) / ED Diagnoses Final diagnoses:  None    Rx / DC Orders ED Discharge Orders     None        Booker Bhatnagar, Debe Coder, MD 01/13/21 [phone]

## 2021-01-13 NOTE — ED Notes (Signed)
Pt alert, NAD, calm, interactive, given privacy screen. Pending imminent xray 8hrs after gastrograffin.

## 2021-01-13 NOTE — Progress Notes (Addendum)
Addendum: INR is therapeutic at 2.4 (8/8) Plan: Continue PTA warfarin: 7.5 mg Sun/Thurs and 3.75 mg M/Tues/W/F/Sat  ANTICOAGULATION CONSULT NOTE - Initial Consult  Pharmacy Consult for Warfarin Indication: atrial fibrillation  Allergies  Allergen Reactions   Bactrim [Sulfamethoxazole-Trimethoprim] Anaphylaxis    Ulcers   Ciprofloxacin Swelling    Tongue   Crestor [Rosuvastatin Calcium]     Severe Myalgias    Patient Measurements: Height: 6' (182.9 cm) Weight: 77.1 kg (170 lb) IBW/kg (Calculated) : 77.6 Heparin Dosing Weight: 77.1 kg  Vital Signs: Temp: 97.7 F (36.5 C) (08/08 0151) Temp Source: Oral (08/08 0151) BP: 151/92 (08/08 0501) Pulse Rate: 54 (08/08 0501)  Labs: Recent Labs    01/13/21 0205  HGB 14.9  HCT 44.9  PLT 222  CREATININE 1.28*    Estimated Creatinine Clearance: 55.2 mL/min (A) (by C-G formula based on SCr of 1.28 mg/dL (H)).   Medical History: Past Medical History:  Diagnosis Date   BPH (benign prostatic hyperplasia)    COPD (chronic obstructive pulmonary disease) ([place]) 2021   Coronary artery disease    a. s/p PCI to LCx in 2007; b. MV 2017 no sig ischemia, EF 59%, nl study. c. Cath 01/2020 without significant recurrent dz.   Depression    GERD (gastroesophageal reflux disease)    Hyperlipidemia    Hypertension    IBS (irritable bowel syndrome)    Myocardial infarction ([place])    12-13 yrs ago    PAF (paroxysmal atrial fibrillation) ([place])    a. CHADS2VASc => 5 (HTN, age x 1, stroke x 2, vascular disease); b. on Coumadin    PNA (pneumonia)    Skin cancer    face    Sleep apnea    off cpap    Stroke ([place])    a. x 2 with residual left-sided weakness   Assessment: CM is a 75 yo M p/w abdominal pain. PMH is significant for A-fib on chronic warfarin, recent retroperitoneal hematoma (early July) with plan to hold warfarin until 01/02/21, HLD, HTN, CAD, prior CVA, movement disorder, and COPD. Pt in normal sinus rhythm on admission.  CHAD2DS2-VASC 5. Last INR on 01/08/21 was 1.3. No known bleeding reported. Of note, pt has worsening gait and instability, w/ multiple falls in past year, is currently in a wheelchair. Last warfarin dose 8/7 '@1845'$   PTA Warfarin dose: 7.5 mg Sun/Thurs, 3.75 mg M/Tues/W/F/Sat  Goal of Therapy:  INR 2-3 Monitor platelets by anticoagulation protocol: Yes   Plan:  Check baseline INR  Will f/u with dose adjustments as needed once INR results available Monitor daily INR, CBC, clinical course, s/sx of bleed, PO intake/diet, Drug-Drug Interactions  Jethro Poling 01/13/2021,12:30 PM

## 2021-01-13 NOTE — ED Notes (Signed)
Back from xray. No changes. Alert, NAD, calm, interactive.

## 2021-01-13 NOTE — ED Notes (Signed)
Pending transport.

## 2021-01-13 NOTE — ED Notes (Signed)
MD at BS

## 2021-01-13 NOTE — Progress Notes (Signed)
Patient arrived to unit from ER with IV fluids running. Alert and oriented x4, skin intact. IV intact. Patient ambulated to bathroom with walker and nurse. Vital signs assessed.

## 2021-01-13 NOTE — Progress Notes (Signed)
Physical Therapy Evaluation Patient Details Name: [name] MRN: [id number] DOB: [birth year] Today's Date: 01/13/2021   History of Present Illness  Pt is a 75 yo male presenting to [facility] ED on 8/8 with chief complaint of stomach pain. CT suggestive of partial  small bowel obstruction vs ileus as well as prior rib fractures. Of note, workup underway for Parkinson's-like symptoms at [facility]. PMH: history of CVA, MI, Afib, HTN, CAD COPD, IBD.  Clinical Impression  Pt presents with the impairments above and problems listed below. Pt min assist for bed mobility and transfers, min guard for ambulation with multimodal cuing throughout. Pt demonstrated left posterior lean during static sitting and standing, but was able to self-correct with min assist. Noted Parkinson's-like gait during ambulation with distance limited by fatigue. Pt has 24/7 help at home from his wife and is currently being seen for HHPT. Recommend continued HHPT to address current limitations. We will continue to follow acutely to promote independence with functional ambulation.    Follow Up Recommendations Home health PT;Supervision/Assistance - 24 hour    Equipment Recommendations  None recommended by PT (pt has recommended DME)    Recommendations for Other Services       Precautions / Restrictions Precautions Precautions: Fall Restrictions Weight Bearing Restrictions: No      Mobility  Bed Mobility Overal bed mobility: Needs Assistance Bed Mobility: Sit to Supine;Supine to Sit     Supine to sit: Min assist Sit to supine: Min assist   General bed mobility comments: Pt required min assist during supine to sit and sit to supine for trunk control and LE advancement. Moderate multimodal cuing during scooting to EOB for level pelvis to ensure both feet on floor.    Transfers Overall transfer level: Needs assistance Equipment used: Rolling walker (2 wheeled) Transfers: Sit to/from Stand Sit to Stand: Min assist          General transfer comment: Pt required min assist for sit to stand transfer to correct posterior lean. Verbal cuing for hand placement during sit to stand. After static standing for ~5s, pt able to self-correct into safe standing posture.  Ambulation/Gait Ambulation/Gait assistance: Min guard Gait Distance (Feet): 30 Feet Assistive device: Rolling walker (2 wheeled) Gait Pattern/deviations: Decreased dorsiflexion - left;Shuffle;Festinating;Decreased step length - left Gait velocity: decreased   General Gait Details: Pt demonstrates Parkinson's-like gait; festination, freezing at transitions, decreased DF on L, decreased hip flexion on L, shuffled steps. Occasional step-through pattern on left, but mostly step-to pattern noted. Pt took many shuffling steps while turning.  After walking ~25f patient reported fatigue and sat in a chair for 584m. Pt able to return to bed after seated rest.  Stairs            Wheelchair Mobility    Modified Rankin (Stroke Patients Only)       Balance Overall balance assessment: Needs assistance;History of Falls Sitting-balance support: Feet supported Sitting balance-Leahy Scale: Poor Sitting balance - Comments: Pt observed to drift from upright sitting into left lateral lean. Pt reported that this is baseline. At least min guard A for sitting balance Postural control: Left lateral lean Standing balance support: Bilateral upper extremity supported;During functional activity Standing balance-Leahy Scale: Poor Standing balance comment: Pt reliant on BUE with RW during mobilty tasks. Upon standing, posterior lean with calves into bed is noted; however, pt able to correct with time.  Pertinent Vitals/Pain Pain Assessment: Faces Faces Pain Scale: Hurts a little bit Pain Location: Pt did report some back pain he attributed to the ED stretcher but did not rate it. Pain Descriptors / Indicators: Aching Pain  Intervention(s): Limited activity within patient's tolerance;Repositioned    Home Living Family/patient expects to be discharged to:: Private residence Living Arrangements: Spouse/significant other Available Help at Discharge: Family;Available 24 hours/day Type of Home: House Home Access: Level entry     Home Layout: Two level;Able to live on main level with bedroom/bathroom Home Equipment: Walker - 2 wheels;Grab bars - tub/shower;Shower seat;Wheelchair - manual;Cane - single point;Toilet riser;Other (comment) (Urinals)      Prior Function Level of Independence: Needs assistance   Gait / Transfers Assistance Needed: Utilizes RW at home for gait for short distances. Has recently used WC and reports it is too big and heavy to use at home. Pt reported four major falls within the past year.  ADL's / Homemaking Assistance Needed: Pt reports wife helps mostly with balance        Hand Dominance        Extremity/Trunk Assessment   Upper Extremity Assessment Upper Extremity Assessment: LUE deficits/detail LUE Deficits / Details: Pt reports left side is weaker from previous CVA. LUE Sensation: WNL LUE Coordination: decreased gross motor (finger to nose)    Lower Extremity Assessment Lower Extremity Assessment: Generalized weakness;RLE deficits/detail;LLE deficits/detail RLE Deficits / Details: Grossly 3+/5 RLE Sensation: WNL LLE Deficits / Details: Grossly 4/5 (pt reports LLE is weaker, but did not appear to be with formal MMT). Noted functional weakness, however, as he tended to drag LLE during gait. LLE Sensation: WNL    Cervical / Trunk Assessment Cervical / Trunk Assessment: Normal  Communication   Communication: No difficulties  Cognition Arousal/Alertness: Awake/alert Behavior During Therapy: WFL for tasks assessed/performed Overall Cognitive Status: No family/caregiver present to determine baseline cognitive functioning                                  General Comments: Pt has history of CVA.      General Comments      Exercises     Assessment/Plan    PT Assessment Patient needs continued PT services  PT Problem List Decreased strength;Decreased range of motion;Decreased activity tolerance;Decreased balance;Decreased mobility;Decreased coordination       PT Treatment Interventions DME instruction;Gait training;Functional mobility training;Therapeutic activities;Therapeutic exercise;Balance training;Cognitive remediation;Patient/family education    PT Goals (Current goals can be found in the Care Plan section)  Acute Rehab PT Goals Patient Stated Goal: to go home PT Goal Formulation: With patient Time For Goal Achievement: 01/27/21 Potential to Achieve Goals: Good    Frequency Min 3X/week   Barriers to discharge        Co-evaluation               AM-PAC PT "6 Clicks" Mobility  Outcome Measure Help needed turning from your back to your side while in a flat bed without using bedrails?: A Little Help needed moving from lying on your back to sitting on the side of a flat bed without using bedrails?: A Little Help needed moving to and from a bed to a chair (including a wheelchair)?: A Little Help needed standing up from a chair using your arms (e.g., wheelchair or bedside chair)?: A Little Help needed to walk in hospital room?: A Little Help needed climbing 3-5 steps with a railing? :  A Lot 6 Click Score: 17    End of Session Equipment Utilized During Treatment: Gait belt Activity Tolerance: Patient limited by fatigue Patient left: in bed (on stretcher in hall in ED) Nurse Communication: Mobility status PT Visit Diagnosis: Unsteadiness on feet (R26.81);Other abnormalities of gait and mobility (R26.89);Muscle weakness (generalized) (M62.81);History of falling (Z91.81);Other symptoms and signs involving the nervous system (R29.898)    Time: [id number] PT Time Calculation (min) (ACUTE ONLY): 26 min   Charges:    PT Evaluation $PT Eval Moderate Complexity: 1 Mod PT Treatments $Gait Training: 8-22 mins        Dawayne Cirri, SPT  Dawayne Cirri 01/13/2021, 3:27 PM

## 2021-01-13 NOTE — ED Notes (Signed)
Assisted to b/r by w/c

## 2021-01-13 NOTE — ED Notes (Signed)
Pending xray in 8 hours.

## 2021-01-13 NOTE — Progress Notes (Signed)
Rechecked BP manually per Dr. Volanda Napoleon. BP 172/80 PR54. Patient denies any chestpain/dizziness. Then again notified MD. MD called back with no new orders. Stated that as long as the patient is asymptomatic, we will keep to close monitor for now.  Call bell within reach and will continue to monitor.

## 2021-01-13 NOTE — ED Notes (Signed)
Pt transported to xray 

## 2021-01-13 NOTE — ED Notes (Signed)
Xray notified of pending xray

## 2021-01-13 NOTE — H&P (Addendum)
[facility] Admission History and Physical Service Pager: [phone]  Patient name: [name] Medical record number: [id number] Date of birth: 1946/02/26 Age: 75 y.o. Gender: male  Primary Care Provider: [name], MD Consultants: General surgery Code Status: DNR Preferred Emergency Contact: Belmin Walcott (spouse) [phone]  Chief Complaint: Abdominal pain  Assessment and Plan: [name] is a 75 y.o. male presenting with abdominal pain. PMH is significant for a-fib (on Warfarin), recent retroperitoneal hematoma, movement disorder (suspected Parkinson-like), HTN, HLD, CAD,  prior CVA, and COPD.  Abdominal Pain Patient woke from sleep with 10/10 abdominal pain in bandlike distribution, associated with nausea but no vomiting. In the ED, he was hemodynamically stable with unremarkable vital signs. Labs including CBC, CMP, and lipase were within normal limits other than leukocytosis to 12.2 and creatinine of 1.28.   CT angiogram of the abdomen and pelvis was obtained given his recent retroperitoneal hematoma. It demonstrated partial small bowel obstruction vs ileus in the mid abdomen without abrupt transition point. Also showed question of acute enteritis.  On my exam his pain is well controlled s/p '4mg'$  IV morphine x1. -Admit to FPTS, med-surg, attending Dr. Gwendlyn Deutscher -General surgery consulted, appreciate recommendations -NPO (except sips with meds) -Gastrografin study per gen surg -No indication for NG tube at this time -Tylenol '650mg'$  q6h prn for pain  Paroxysmal A-Fib on Warfarin Patient in normal sinus rhythm on admission. Takes amiodarone '200mg'$  daily, bisoprolol '15mg'$  daily, and warfarin at home (current regimen: 7.'5mg'$  Sun/Thurs, 3.'75mg'$  remaining days). Last INR on 01/08/21 was 1.3. Goal INR 2.0-3.0.  -Check INR -Continue Warfarin, will dose-adjust as needed pending results of INR -Continue home Amiodarone and Bisoprolol  Acute Kidney  Injury Creatinine mildly elevated at 1.28 on admission (baseline ~1.0-1.1). Likely pre-renal as patient has evidence of dehydration (spec grav >1.046) on UA and endorses somewhat poor PO intake. -mIVF (NS at 11m/hr) -Daily BMP  Movement Disorder Patient with somewhat newly diagnosed movement disorder, thought to be Parkinson-like. Takes Sinemet 25-100 TID at home as well as tizanidine nightly as needed. Predominately wheelchair bound but is able to ambulate with a walker and supervision. -Continue home meds -PT/OT -Fall precautions -Outpatient neuro follow-up  Leukocytosis Mild leukocytosis with left shift noted on admission- WBC 12.2, neutrophils 10.3.  CTA showed "mild inflammatory stranding in the small bowel mesentery raising the possibility of acute enteritis". UA without signs of infection. No respiratory symptoms. -Daily CBC  Hypertension Mildly hypertensive on admission to 151/92. Home meds: lisinopril '40mg'$  daily and bisoprolol '15mg'$  daily -Continue home bisoprolol -Holding home Lisinopril due to AKI -Monitor BP  HLD, history of CVA Well controlled- LDL 64 on 10/16/20. Home meds: Aspirin, Warfarin, Pravastatin, Praluent  -Continue home meds -Next due for praluent on 8/11  Depression Patient with worsening depression recently given his declining health and loss of his independence. Home meds: Duloxetine '40mg'$  daily -Continue home Duloxetine -Outpatient PCP follow up on 01/17/21  Rib Fractures CTA showed multiple healing rib fractures, presumably from his multiple falls several months ago. No falls over past 1 month. -Tylenol '650mg'$  q6h as needed for pain -Fall precautions  FEN/GI: NPO Prophylaxis: Warfarin per pharmacy  Disposition: Stable  History of Present Illness:  [name] a 75y.o. male presenting with abdominal pain.  Patient woke from sleep this morning with severe 10/10 abdominal pain. It was located in a band-like distribution across his entire  upper and mid abdomen. He endorsed associated diaphoresis, nausea and belching. No vomiting, chest pain, SOB, fever,  chills or urinary symptoms. Last BM was yesterday afternoon and was normal. Otherwise has been in his usual states of health leading up to today.   He was concerned about his heart or a recurrence of his retroperitoneal hematoma, which is what prompted him to call EMS.  Of note, patient was recently admitted to [facility] approximately 1 month ago after recurrent falls. At that time he was found to have a retroperitoneal hematoma and was also diagnosed with a PD-like movement disorder, for which he was started on Sinemet TID.  Review Of Systems: Per HPI with the following additions:   Review of Systems  Constitutional:  Positive for appetite change. Negative for fever.  Respiratory:  Negative for cough and shortness of breath.   Cardiovascular:  Negative for chest pain and leg swelling.  Gastrointestinal:  Positive for abdominal distention, abdominal pain and nausea. Negative for constipation, diarrhea and vomiting.  Genitourinary:  Negative for difficulty urinating and dysuria.  Musculoskeletal:  Positive for gait problem.  Skin:  Negative for rash.    Patient Active Problem List   Diagnosis Date Noted   Positive depression screening 01/08/2021   Thyroid nodule 12/23/2020   Suspected Movement disorder- possibly PD  12/22/2020   Hematoma- Paraspinal from Fall  12/22/2020   Frequent falls 11/13/2020   Orthostatic dizziness 08/28/2020   Chronic pain of both lower extremities 07/17/2020   COPD exacerbation ([place]) 05/08/2020   Statin myopathy 06/07/2019   Left-sided weakness 08/12/2018   Chronic obstructive pulmonary disease ([place]) 06/13/2018   Sleep apnea 09/16/2017   Testicular atrophy 04/23/2017   GERD (gastroesophageal reflux disease) 02/08/2015   IBS (irritable bowel syndrome) 02/08/2015   Essential hypertension 02/08/2015   AF (paroxysmal atrial fibrillation) ([place])  01/23/2015   CAD (coronary artery disease) 01/23/2015   History of CVA (cerebrovascular accident) 01/23/2015    Past Medical History: Past Medical History:  Diagnosis Date   BPH (benign prostatic hyperplasia)    COPD (chronic obstructive pulmonary disease) ([place]) 2021   Coronary artery disease    a. s/p PCI to LCx in 2007; b. MV 2017 no sig ischemia, EF 59%, nl study. c. Cath 01/2020 without significant recurrent dz.   Depression    GERD (gastroesophageal reflux disease)    Hyperlipidemia    Hypertension    IBS (irritable bowel syndrome)    Myocardial infarction ([place])    12-13 yrs ago    PAF (paroxysmal atrial fibrillation) ([place])    a. CHADS2VASc => 5 (HTN, age x 1, stroke x 2, vascular disease); b. on Coumadin    PNA (pneumonia)    Skin cancer    face    Sleep apnea    off cpap    Stroke ([place])    a. x 2 with residual left-sided weakness    Past Surgical History: Past Surgical History:  Procedure Laterality Date   [place]  2006   X1 STENT   COLONOSCOPY     CORONARY ANGIOPLASTY     POLYPECTOMY     RIGHT/LEFT HEART CATH AND CORONARY ANGIOGRAPHY N/A 01/25/2020   Procedure: RIGHT/LEFT HEART CATH AND CORONARY ANGIOGRAPHY;  Surgeon: Minna Merritts, MD;  Location: [place] [facility];  Service: Cardiovascular;  Laterality: N/A;   SKIN CANCER EXCISION N/A 08/2016   [place].  [facility] (Dr. Deliah Boston)   THYROIDECTOMY, PARTIAL     UPPER GASTROINTESTINAL ENDOSCOPY     VASCULAR SURGERY     varicose vein stripping left leg  Social History: Social History   Tobacco Use   Smoking status: Former    Years: 20.00    Types: Cigarettes   Smokeless tobacco: Never  Vaping Use   Vaping Use: Never used  Substance Use Topics   Alcohol use: Yes    Alcohol/week: 2.0 - 3.0 standard drinks    Types: 2 - 3 Glasses of wine per week    Comment: Wine daily with dinner most days   Drug use: No    Family History: Family  History  Problem Relation Age of Onset   Heart Problems Father    Heart disease Father    Hypertension Father    Diabetes Father    Mental illness Mother    Arthritis Mother    Cancer Mother        ovarian   Stroke Paternal Uncle    Heart disease Paternal Grandfather    Cancer Maternal Aunt    Stroke Maternal Grandmother    Sudden death Maternal Grandfather    Heart disease Paternal Grandmother    Colon cancer Neg Hx    Esophageal cancer Neg Hx    Rectal cancer Neg Hx    Colon polyps Neg Hx    Stomach cancer Neg Hx     Allergies and Medications: Allergies  Allergen Reactions   Bactrim [Sulfamethoxazole-Trimethoprim] Anaphylaxis    Ulcers   Ciprofloxacin Swelling    Tongue   Crestor [Rosuvastatin Calcium]     Severe Myalgias   Current Facility-Administered Medications on File Prior to Encounter  Medication Dose Route Frequency Provider Last Rate Last Admin   sodium chloride flush (NS) 0.9 % injection 3 mL  3 mL Intravenous Q12H Mickle Plumb, Jacquelyn D, PA-C       Current Outpatient Medications on File Prior to Encounter  Medication Sig Dispense Refill   acetaminophen (TYLENOL) 500 MG tablet Take 1,000 mg by mouth every 6 (six) hours as needed for mild pain.     Alirocumab (PRALUENT) 75 MG/ML SOAJ Inject 75 mg into the skin every 14 (fourteen) days. 2 mL 11   amiodarone (PACERONE) 200 MG tablet Take 1 tablet (200 mg total) by mouth daily. 90 tablet 2   aspirin 81 MG tablet Take 81 mg by mouth at bedtime.      bisoprolol (ZEBETA) 10 MG tablet Take 1.5 tablets (15 mg total) by mouth daily. 180 tablet 1   carbidopa-levodopa (SINEMET IR) 25-100 MG tablet Take 1 tablet by mouth 3 (three) times daily. 90 tablet 0   DULoxetine (CYMBALTA) 20 MG capsule Take 2 capsules (40 mg total) by mouth daily. 180 capsule 3   fluticasone (FLONASE) 50 MCG/ACT nasal spray Place into both nostrils.     hydrocortisone 2.5 % cream Apply topically daily as needed.     lisinopril (ZESTRIL) 40 MG tablet  TAKE 1 TABLET (40 MG TOTAL) BY MOUTH DAILY. FOR BLOOD PRESSURE. 90 tablet 1   Multiple Vitamins-Minerals (MULTIVITAMIN WITH MINERALS) tablet Take 1 tablet by mouth daily. Centrum Silver     nitroGLYCERIN (NITROSTAT) 0.4 MG SL tablet Place 1 tablet (0.4 mg total) every 5 (five) minutes as needed under the tongue for chest pain. 25 tablet 3   omeprazole (PRILOSEC) 40 MG capsule TAKE 1 CAPSULE (40 MG TOTAL) BY MOUTH 2 (TWO) TIMES DAILY BEFORE A MEAL. FOR HEARTBURN. 180 capsule 1   pravastatin (PRAVACHOL) 80 MG tablet Take 1 tablet (80 mg total) by mouth daily. For cholesterol.     tiZANidine (ZANAFLEX) 4 MG tablet TAKE  1 TABLET BY MOUTH AT BEDTIME AS NEEDED FOR MUSCLE SPASMS. 30 tablet 0    Objective: BP (!) 151/92 (BP Location: Left Arm)   Pulse (!) 54   Temp 97.7 F (36.5 C) (Oral)   Resp 18   Ht 6' (1.829 m)   Wt 77.1 kg   SpO2 93%   BMI 23.06 kg/m  Exam: General: alert, resting comfortably in bed, NAD Eyes: PERRL, normal sclera and conjunctiva ENTM: moist mucous membranes, oropharynx normal Neck: supple, no cervical lymphadenopathy Cardiovascular: RRR, normal S1/S2 without m/r/g Respiratory: normal effort, lungs CTAB Gastrointestinal: +BS, minimally distended, nontender MSK: no peripheral edema Derm: no rashes Neuro: CN II-XII intact, equal strength in all extremities, sensation to light touch intact throughout Psych: appropriate mood and affect  Labs and Imaging: CBC BMET  Recent Labs  Lab 01/13/21 0205  WBC 12.2*  HGB 14.9  HCT 44.9  PLT 222   Recent Labs  Lab 01/13/21 0205  NA 138  K 4.2  CL 102  CO2 29  BUN 13  CREATININE 1.28*  GLUCOSE 164*  CALCIUM 9.4     EKG: not obtained in the ED. One has been ordered.  CT Angio Abd/Pel W and/or Wo Contrast Result Date: 01/13/2021 IMPRESSION: 1. Negative for aortic dissection or aneurysm. Aortic Atherosclerosis (ICD10-I70.0). Calcified coronary artery atherosclerosis. Cardiomegaly. 2. Small volume of free fluid in  the pelvis with appearance of partial small bowel obstruction versus small bowel ileus in the mid abdomen, no abrupt transition point. Superimposed mild inflammatory stranding in the small bowel mesentery raising the possibility of Acute Enteritis. The relatively mild atherosclerosis of abdominal aortic branches argues against bowel ischemia. Normal appendix. Diverticulosis of the descending and sigmoid colon without convincing active large bowel inflammation. 3. Healing multiple left lower rib fractures and left L1 and L2 transverse process fractures. The left 12th rib might be fractured in 2 places. 4. Negative for retroperitoneal hematoma. No other acute or inflammatory process identified in the chest, abdomen, or pelvis. Electronically Signed   By: Genevie Ann M.D.   On: 01/13/2021 04:20     [name], MD 01/13/2021, 8:34 AM PGY-2, [place] [facility] pager: [phone], text pages welcome

## 2021-01-13 NOTE — ED Triage Notes (Addendum)
Pt BIB EMS from home for abdominal pain. Woken up from sleep with abd pain that radiated across entire abdomen to his back. Wife reports to EMS that pt was cool, pale, and diaphoretic while attempting to have bowel movement after awaking with pain. Pt also c/o abdominal bloating. Nausea and emesis with EMS. Denies SOB.   Newly diagnosed with Parkinsons Hx CVA and MI  EMS vitals: BP 146/84 HR 53 100% room air CBG 170

## 2021-01-13 NOTE — ED Notes (Signed)
PT working with pt. Pt remains alert, NAD, calm, interactive.

## 2021-01-13 NOTE — Telephone Encounter (Signed)
Patient's wife called to say that he patient has been admit into and would like for Dr. Irish Lack to come by to see him in the hospital if possible

## 2021-01-13 NOTE — Progress Notes (Signed)
BP slightly elevated, paged [place] for the attending. Will monitor.

## 2021-01-13 NOTE — Consult Note (Signed)
Reason for Consult/Chief Complaint: pSBO vs ileus Consultant: [name], [name]  [name] is an 75 y.o. male.   HPI: 57M s/p fall in early July who sustained a retroperitoneal hematoma presents with sudden onset severe abdominal pain associated with nausea. Denies vomiting, changes to bowels. Last BM 8/7 PM. No prior abdominal surgery. Last c-scope 4-5y ago.   Past Medical History:  Diagnosis Date   BPH (benign prostatic hyperplasia)    COPD (chronic obstructive pulmonary disease) ([place]) 2021   Coronary artery disease    a. s/p PCI to LCx in 2007; b. MV 2017 no sig ischemia, EF 59%, nl study. c. Cath 01/2020 without significant recurrent dz.   Depression    GERD (gastroesophageal reflux disease)    Hyperlipidemia    Hypertension    IBS (irritable bowel syndrome)    Myocardial infarction ([place])    12-13 yrs ago    PAF (paroxysmal atrial fibrillation) ([place])    a. CHADS2VASc => 5 (HTN, age x 1, stroke x 2, vascular disease); b. on Coumadin    PNA (pneumonia)    Skin cancer    face    Sleep apnea    off cpap    Stroke ([place])    a. x 2 with residual left-sided weakness    Past Surgical History:  Procedure Laterality Date   [place]  2006   X1 STENT   COLONOSCOPY     CORONARY ANGIOPLASTY     POLYPECTOMY     RIGHT/LEFT HEART CATH AND CORONARY ANGIOGRAPHY N/A 01/25/2020   Procedure: RIGHT/LEFT HEART CATH AND CORONARY ANGIOGRAPHY;  Surgeon: Minna Merritts, [name];  Location: [facility];  Service: Cardiovascular;  Laterality: N/A;   SKIN CANCER EXCISION N/A 08/2016   [place].  [facility] (Dr. Deliah Boston)   THYROIDECTOMY, PARTIAL     UPPER GASTROINTESTINAL ENDOSCOPY     VASCULAR SURGERY     varicose vein stripping left leg    Family History  Problem Relation Age of Onset   Heart Problems Father    Heart disease Father    Hypertension Father    Diabetes Father    Mental illness Mother    Arthritis Mother     Cancer Mother        ovarian   Stroke Paternal Uncle    Heart disease Paternal Grandfather    Cancer Maternal Aunt    Stroke Maternal Grandmother    Sudden death Maternal Grandfather    Heart disease Paternal Grandmother    Colon cancer Neg Hx    Esophageal cancer Neg Hx    Rectal cancer Neg Hx    Colon polyps Neg Hx    Stomach cancer Neg Hx     Social History:  reports that he has quit smoking. His smoking use included cigarettes. He has never used smokeless tobacco. He reports current alcohol use of about 2.0 - 3.0 standard drinks of alcohol per week. He reports that he does not use drugs.  Allergies:  Allergies  Allergen Reactions   Bactrim [Sulfamethoxazole-Trimethoprim] Anaphylaxis    Ulcers   Ciprofloxacin Swelling    Tongue   Crestor [Rosuvastatin Calcium]     Severe Myalgias    Medications: I have reviewed the patient's current medications.  Results for orders placed or performed during the hospital encounter of 01/13/21 (from the past 48 hour(s))  CBC with Differential     Status: Abnormal   Collection Time: 01/13/21  2:05  AM  Result Value Ref Range   WBC 12.2 (H) 4.0 - 10.5 K/uL   RBC 4.73 4.22 - 5.81 MIL/uL   Hemoglobin 14.9 13.0 - 17.0 g/dL   HCT 44.9 39.0 - 52.0 %   MCV 94.9 80.0 - 100.0 fL   MCH 31.5 26.0 - 34.0 pg   MCHC 33.2 30.0 - 36.0 g/dL   RDW 14.0 11.5 - 15.5 %   Platelets 222 150 - 400 K/uL   nRBC 0.0 0.0 - 0.2 %   Neutrophils Relative % 84 %   Neutro Abs 10.3 (H) 1.7 - 7.7 K/uL   Lymphocytes Relative 8 %   Lymphs Abs 0.9 0.7 - 4.0 K/uL   Monocytes Relative 7 %   Monocytes Absolute 0.8 0.1 - 1.0 K/uL   Eosinophils Relative 1 %   Eosinophils Absolute 0.1 0.0 - 0.5 K/uL   Basophils Relative 0 %   Basophils Absolute 0.0 0.0 - 0.1 K/uL   Immature Granulocytes 0 %   Abs Immature Granulocytes 0.03 0.00 - 0.07 K/uL    Comment: Performed at [facility], [address] [place], [place] [address]  Comprehensive metabolic panel     Status:  Abnormal   Collection Time: 01/13/21  2:05 AM  Result Value Ref Range   Sodium 138 135 - 145 mmol/L   Potassium 4.2 3.5 - 5.1 mmol/L   Chloride 102 98 - 111 mmol/L   CO2 29 22 - 32 mmol/L   Glucose, Bld 164 (H) 70 - 99 mg/dL    Comment: Glucose reference range applies only to samples taken after fasting for at least 8 hours.   BUN 13 8 - 23 mg/dL   Creatinine, Ser 1.28 (H) 0.61 - 1.24 mg/dL   Calcium 9.4 8.9 - 10.3 mg/dL   Total Protein 7.0 6.5 - 8.1 g/dL   Albumin 3.7 3.5 - 5.0 g/dL   AST 21 15 - 41 U/L   ALT 15 0 - 44 U/L   Alkaline Phosphatase 94 38 - 126 U/L   Total Bilirubin 0.6 0.3 - 1.2 mg/dL   GFR, Estimated 59 (L) >60 mL/min    Comment: (NOTE) Calculated using the CKD-EPI Creatinine Equation (2021)    Anion gap 7 5 - 15    Comment: Performed at [facility] [address] [place], [place] [address]  Lipase, blood     Status: None   Collection Time: 01/13/21  2:05 AM  Result Value Ref Range   Lipase 28 11 - 51 U/L    Comment: Performed at [facility] [address], [place], [place] [address]    CT Angio Abd/Pel W and/or Wo Contrast  Result Date: 01/13/2021 CLINICAL DATA:  75 year old male woke from sleep with abdominal pain radiating to the back, diaphoretic. Abdominal bloating. EXAM: CT ANGIOGRAPHY CHEST, ABDOMEN AND PELVIS TECHNIQUE: Multidetector CT imaging through the chest, abdomen and pelvis was performed using the standard protocol during bolus administration of intravenous contrast. Multiplanar reconstructed images and MIPs were obtained and reviewed to evaluate the vascular anatomy. CONTRAST:  110m OMNIPAQUE IOHEXOL 350 MG/ML SOLN COMPARISON:  Thoracic and lumbar MRI 11/13/2020. Chest radiographs 05/08/2020. FINDINGS: CTA CHEST FINDINGS Cardiovascular: Calcified coronary artery atherosclerosis (series 5, image 83). Comparatively mild thoracic aortic atherosclerosis. Negative for thoracic aortic aneurysm or dissection. Cardiomegaly. No pericardial  effusion. Proximal great vessels are patent. Mediastinum/Nodes: Negative. No mediastinal lymphadenopathy or hematoma. Lungs/Pleura: Atelectatic changes to the major airways which are otherwise patent. Mild mosaic  attenuation in both lungs compatible with combined mild atelectasis and gas trapping. Mild additional dependent atelectasis. No pneumothorax, pleural effusion, or confluent pulmonary opacity. Musculoskeletal: There are healing fractures of the posterolateral left 11th and 12th ribs (series 6 image 157) with abundant periosteal new bone formation. Possible superimposed more recent nondisplaced fracture of the posterior 12th rib on series 6, image 165. Chronic fracture of the left 10th rib costovertebral junction. Chronic fractures of the anterior left 10th and 11th ribs. No other acute acute osseous abnormality identified. Review of the MIP images confirms the above findings. CTA ABDOMEN AND PELVIS FINDINGS VASCULAR Negative for abdominal aortic aneurysm or dissection. Mild-to-moderate Aortoiliac calcified atherosclerosis. Major aortic branches are patent including the SMA, and little to no SMA atherosclerosis is evident. 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 Bilateral iliac and proximal femoral arteries are patent. Mild to moderate proximal femoral artery atherosclerosis. Review of the MIP images confirms the above findings. NON-VASCULAR Hepatobiliary: Liver and gallbladder are within normal limits. Pancreas: Atrophied pancreas.  No active inflammation. Spleen: Spleen is within normal limits. Adrenals/Urinary Tract: Normal adrenal glands. Nonobstructed kidneys with symmetric renal enhancement. Small benign appearing right renal cyst. No nephrolithiasis is evident. Decompressed ureters. Diminutive urinary bladder. Multiple pelvic phleboliths mostly on the left. Stomach/Bowel: Mild retained stool in the rectum. Redundant sigmoid colon tracks into the mid right abdomen. Moderate sigmoid  diverticulosis including in the central pelvis, but no convincing sigmoid wall thickening or mesenteric stranding. Small volume of free fluid abuts the sigmoid colon. Intermittent diverticulosis of the upstream descending colon with no convincing inflammation. Fluid in nondilated transverse and right colon. Normal appendix on series 5, image 254. Decompressed terminal ileum. Decompressed small bowel in the right lateral abdomen. Fluid in nondistended stomach and duodenum. Proximal jejunum loops are within normal limits. But in the mid abdomen there are multiple dilated air and containing small bowel loops measuring up to about 37 mm diameter (coronal image 30). But these have a gradual transition both upstream and downstream to decompressed small bowel. However, there is mild inflammatory stranding in the small bowel mesentery visible on coronal image 63. No free air. No free fluid in the abdomen. No pneumoperitoneum identified. Lymphatic: No lymphadenopathy. Reproductive: Negative. Other: Small volume of simple density free fluid in the pelvis on series 5, image 274. No presacral or perirectal inflammatory stranding. Musculoskeletal: Healing fractures of the left L1 and L2 transverse processes with periosteal new bone formation (series 6, image 181). Stable lumbar vertebral height and alignment. Sacrum, SI joints, pelvis and proximal femurs appear intact. No other acute osseous abnormality identified. Review of the MIP images confirms the above findings. IMPRESSION: 1. Negative for aortic dissection or aneurysm. Aortic Atherosclerosis (ICD10-I70.0). Calcified coronary artery atherosclerosis. Cardiomegaly. 2. Small volume of free fluid in the pelvis with appearance of partial small bowel obstruction versus small bowel ileus in the mid abdomen, no abrupt transition point. Superimposed mild inflammatory stranding in the small bowel mesentery raising the possibility of Acute Enteritis. The relatively mild  atherosclerosis of abdominal aortic branches argues against bowel ischemia. Normal appendix. Diverticulosis of the descending and sigmoid colon without convincing active large bowel inflammation. 3. Healing multiple left lower rib fractures and left L1 and L2 transverse process fractures. The left 12th rib might be fractured in 2 places. 4. Negative for retroperitoneal hematoma. No other acute or inflammatory process identified in the chest, abdomen, or pelvis. Electronically Signed   By: Genevie Ann M.D.   On: 01/13/2021 04:20  ROS 10 point review of systems is negative except as listed above in HPI.   Physical Exam Blood pressure (!) 151/92, pulse (!) 54, temperature 97.7 F (36.5 C), temperature source Oral, resp. rate 18, height 6' (1.829 m), weight 77.1 kg, SpO2 93 %. Constitutional: well-developed, well-nourished HEENT: pupils equal, round, reactive to light, 65m b/l, moist conjunctiva, external inspection of ears and nose normal, hearing intact Oropharynx: normal oropharyngeal mucosa, normal dentition Neck: no thyromegaly, trachea midline, no midline cervical tenderness to palpation Chest: breath sounds equal bilaterally, normal respiratory effort, no midline or lateral chest wall tenderness to palpation/deformity Abdomen: soft, NT, no bruising, no hepatosplenomegaly GU:  deferred   Back: no wounds, no thoracic/lumbar spine tenderness to palpation, no thoracic/lumbar spine stepoffs Rectal: deferred Extremities: 2+ radial and pedal pulses bilaterally, motor and sensation intact to bilateral UE and LE, no peripheral edema MSK: unable to assess gait/station, no clubbing/cyanosis of fingers/toes, normal ROM of all four extremities Skin: warm, dry, no rashes Psych: normal memory, normal mood/affect    Assessment/Plan: 17M with pSBO vs ileus. Recommend gastrografin SB protocol, can take PO as he is not vomiting.    AJesusita Oka [name]

## 2021-01-14 ENCOUNTER — Ambulatory Visit: Payer: Medicare HMO | Admitting: Family Medicine

## 2021-01-14 DIAGNOSIS — K56600 Partial intestinal obstruction, unspecified as to cause: Secondary | ICD-10-CM | POA: Diagnosis not present

## 2021-01-14 DIAGNOSIS — I10 Essential (primary) hypertension: Secondary | ICD-10-CM

## 2021-01-14 DIAGNOSIS — R110 Nausea: Secondary | ICD-10-CM | POA: Diagnosis not present

## 2021-01-14 DIAGNOSIS — G4733 Obstructive sleep apnea (adult) (pediatric): Secondary | ICD-10-CM | POA: Diagnosis not present

## 2021-01-14 DIAGNOSIS — R296 Repeated falls: Secondary | ICD-10-CM | POA: Diagnosis not present

## 2021-01-14 DIAGNOSIS — R100 Acute abdomen: Secondary | ICD-10-CM | POA: Diagnosis not present

## 2021-01-14 DIAGNOSIS — R1084 Generalized abdominal pain: Secondary | ICD-10-CM | POA: Diagnosis not present

## 2021-01-14 LAB — CBC
HCT: 39 % (ref 39.0–52.0)
Hemoglobin: 13 g/dL (ref 13.0–17.0)
MCH: 31.9 pg (ref 26.0–34.0)
MCHC: 33.3 g/dL (ref 30.0–36.0)
MCV: 95.6 fL (ref 80.0–100.0)
Platelets: 188 10*3/uL (ref 150–400)
RBC: 4.08 MIL/uL — ABNORMAL LOW (ref 4.22–5.81)
RDW: 14.2 % (ref 11.5–15.5)
WBC: 8 10*3/uL (ref 4.0–10.5)
nRBC: 0 % (ref 0.0–0.2)

## 2021-01-14 LAB — PROTIME-INR
INR: 2.6 — ABNORMAL HIGH (ref 0.8–1.2)
Prothrombin Time: 27.4 seconds — ABNORMAL HIGH (ref 11.4–15.2)

## 2021-01-14 LAB — BASIC METABOLIC PANEL
Anion gap: 7 (ref 5–15)
BUN: 13 mg/dL (ref 8–23)
CO2: 27 mmol/L (ref 22–32)
Calcium: 8.5 mg/dL — ABNORMAL LOW (ref 8.9–10.3)
Chloride: 103 mmol/L (ref 98–111)
Creatinine, Ser: 1 mg/dL (ref 0.61–1.24)
GFR, Estimated: >60 mL/min (ref >60)
Glucose, Bld: 126 mg/dL — ABNORMAL HIGH (ref 70–99)
Potassium: 3.8 mmol/L (ref 3.5–5.1)
Sodium: 137 mmol/L (ref 135–145)

## 2021-01-14 MED ORDER — ACETAMINOPHEN 325 MG PO TABS: 650.0000 mg | ORAL_TABLET | Freq: Four times a day (QID) | ORAL | 1 refills | Status: DC | PRN

## 2021-01-14 MED ORDER — BISOPROLOL FUMARATE 5 MG PO TABS
2.5000 mg | ORAL_TABLET | Freq: Every day | ORAL | Status: DC
  Administered 2021-01-14: 2.5 mg via ORAL
  Filled 2021-01-14: qty 1

## 2021-01-14 MED ORDER — BISOPROLOL FUMARATE 5 MG PO TABS: 2.5000 mg | ORAL_TABLET | Freq: Every day | ORAL | 1 refills | Status: DC

## 2021-01-14 MED ORDER — TIZANIDINE HCL 4 MG PO TABS: 4.0000 mg | ORAL_TABLET | Freq: Every evening | ORAL | 1 refills | Status: DC | PRN

## 2021-01-14 MED ORDER — LISINOPRIL 40 MG PO TABS: 40.0000 mg | ORAL_TABLET | Freq: Every day | ORAL | 1 refills | Status: DC

## 2021-01-14 MED ORDER — LISINOPRIL 40 MG PO TABS
40.0000 mg | ORAL_TABLET | Freq: Every day | ORAL | Status: DC
  Administered 2021-01-14: 40 mg via ORAL
  Filled 2021-01-14: qty 1

## 2021-01-14 MED ORDER — CARBIDOPA-LEVODOPA 25-100 MG PO TABS
1.0000 | ORAL_TABLET | Freq: Three times a day (TID) | ORAL | 1 refills | Status: DC
Start: 2021-01-14 — End: 2021-05-15

## 2021-01-14 NOTE — Hospital Course (Addendum)
[name] is a 75 year old with a history of A. fib (on warfarin), recent retroperitoneal hematoma, movement disorder (suspected Parkinson's-like), HTN, HLD, CAD, prior CVA, and COPD who was admitted to the [facility] teaching Service at [facility] for abdominal pain. His hospital course is detailed below:  Abdominal pain Supported the patient woke from his sleep with 10/10 abdominal pain in bandlike distribution with nausea.  In the ED patient was hemodynamically stable with unremarkable vital signs.  Patient notably had creatinine bump to 1.28 and leukocytosis to 12.2.  CT angiogram noted partial small bowel obstruction versus ileus in the mid abdomen without abrupt transition point and questionable acute enteritis.  Patient was placed n.p.o. with Gastrografin study placed per general surgery.  Gastrografin study noted persistent central small bowel distention measuring up to 4.5 cm.  Patient had several bowel movements with significantly improved abdominal pain.  His WBC improved to 8.0 and creatinine improved 1.0.  General surgery advised he start back on regular diet.  Patient tolerated regular diet well and was without pain upon discharge.  Bradycardia  hypertension As patient's creatinine was increased, lisinopril medication was held.  Patient was notably borderline bradycardic in the 50s throughout hospital course with notable hypotension up to 170s over 90s.  He remained asymptomatic and when creatinine improved was replaced on lisinopril and decreased dosage of bisoprolol for tolerable continued bradycardia.  Recommend adjusting blood pressure medications as tolerable.  PCP Follow Up Recommendations Consider transitioning from Warfarin to DOAC. Bisoprolol decreased from 15 mg to 2.5 mg given bradycardia to 50's. Continue to adjust as necessary.

## 2021-01-14 NOTE — Progress Notes (Signed)
Addendum:   ANTICOAGULATION CONSULT NOTE - [place] for Warfarin Indication: atrial fibrillation  Allergies  Allergen Reactions   Bactrim [Sulfamethoxazole-Trimethoprim] Anaphylaxis    Ulcers   Ciprofloxacin Swelling    Tongue   Crestor [Rosuvastatin Calcium]     Severe Myalgias    Patient Measurements: Height: 6' (182.9 cm) Weight: 77.1 kg (170 lb) IBW/kg (Calculated) : 77.6 Heparin Dosing Weight: 77.1 kg  Vital Signs: Temp: 98.2 F (36.8 C) (08/09 1341) Temp Source: Oral (08/09 1341) BP: 159/84 (08/09 1341) Pulse Rate: 56 (08/09 1341)  Labs: Recent Labs    01/13/21 0205 01/13/21 1305 01/14/21 0130  HGB 14.9  --  13.0  HCT 44.9  --  39.0  PLT 222  --  188  LABPROT  --  25.8* 27.4*  INR  --  2.4* 2.6*  CREATININE 1.28*  --  1.00     Estimated Creatinine Clearance: 70.7 mL/min (by C-G formula based on SCr of 1 mg/dL).   Medical History: Past Medical History:  Diagnosis Date   BPH (benign prostatic hyperplasia)    COPD (chronic obstructive pulmonary disease) ([place]) 2021   Coronary artery disease    a. s/p PCI to LCx in 2007; b. MV 2017 no sig ischemia, EF 59%, nl study. c. Cath 01/2020 without significant recurrent dz.   Depression    GERD (gastroesophageal reflux disease)    Hyperlipidemia    Hypertension    IBS (irritable bowel syndrome)    Myocardial infarction ([place])    12-13 yrs ago    PAF (paroxysmal atrial fibrillation) ([place])    a. CHADS2VASc => 5 (HTN, age x 1, stroke x 2, vascular disease); b. on Coumadin    PNA (pneumonia)    Skin cancer    face    Sleep apnea    off cpap    Stroke ([place])    a. x 2 with residual left-sided weakness   Assessment: CM is a 75 yo M p/w abdominal pain. PMH is significant for A-fib on chronic warfarin, recent retroperitoneal hematoma (early July) with plan to hold warfarin until 01/02/21, HLD, HTN, CAD, prior CVA, movement disorder, and COPD. Pt in normal sinus rhythm on admission.  CHAD2DS2-VASC 5.  Of note, pt has worsening gait and instability, w/ multiple falls in past year, is currently in a wheelchair. Last INR PTA (01/08/21) was 1.3.  No known bleeding reported. INR is therapeutic at 2.6.  PTA Warfarin dose: 7.5 mg Sun/Thurs, 3.75 mg M/Tues/W/F/Sat   Goal of Therapy:  INR 2-3 Monitor platelets by anticoagulation protocol: Yes   Plan:  Continue PTA warfarin: 7.5 mg Sun/Thurs and 3.75 mg M/Tues/W/F/Sat Monitor daily INR, CBC, clinical course, s/sx of bleed, PO intake/diet, Drug-Drug Interactions  Jethro Poling 01/14/2021,2:47 PM

## 2021-01-14 NOTE — Progress Notes (Signed)
  The patient denies any concerns today. Pain has resolved, and he moved his bowel this morning without difficulty.  Exam: Gen: Comfortably in a recliner Heart: S1 S2 normal, no murmur. RRR Resp: Air entry clear Abd: Soft, non-tender, normoactive bowel sound  DG Abd Portable 1V-Small Bowel Obstruction Protocol-initial, 8 hr delay  Result Date: 01/13/2021 CLINICAL DATA:  Follow-up small bowel obstruction.  8 hour delay. EXAM: PORTABLE ABDOMEN - 1 VIEW COMPARISON:  CT earlier today. FINDINGS: Administered enteric contrast is seen within dilated small bowel, however there is contrast in the ascending and transverse colon. Central small bowel distension measures up to 4.5 cm. IMPRESSION: There is contrast in the ascending and transverse colon. Persistent central small bowel distention measuring up to 4.5 cm. Electronically Signed   By: Keith Rake M.D.   On: 01/13/2021 17:51       A/P: Acute abdomen. DG abdomen reviewed The surgical team saw him, no surgical intervention is recommended at this time, and he seems to be back at his baseline. We will advance his diet, and if he tolerates it well, he can be d/ced today. He and his wife agreed with the plan.

## 2021-01-14 NOTE — TOC Initial Note (Signed)
Transition of Care [facility]) - Initial/Assessment Note    Patient Details  Name: [name] MRN: [id number] Date of Birth: September 27, 1945  Transition of Care [facility]) CM/SW Contact:    Marilu Favre, RN Phone Number: 01/14/2021, 11:59 AM  Clinical Narrative:                  Spoke to patient and wife [name] at bedside.   Patient from home with wife, home health PT with [facility].   Patient has 3 in 1 and walker.   Patient also has wheel chair, however, states it is uncomfortable , the back "digs into him". Wife states the wheel chair came from [place].   NCM spoke to [place] with [facility] he got a wheel chair on 01/10/21, but there was no back cushion ordered only seat cushion. NCM ordered back cushion for a 18 inch wheel chair. [employer] will bring to room.   NCM spoke to [place] with [facility]. Will need orders signed. Fax [phone]  Expected Discharge Plan: [place]     Patient Goals and CMS Choice Patient states their goals for this hospitalization and ongoing recovery are:: to go home CMS Medicare.gov Compare Post Acute Care list provided to:: Patient Choice offered to / list presented to : Patient  Expected Discharge Plan and Services Expected Discharge Plan: [place]   Discharge Planning Services: CM Consult Post Acute Care Choice: [place] arrangements for the past 2 months: Single Family Home                 DME Arranged: Other see comment (wheel chair back cushion) DME Agency: [employer] Date DME Agency Contacted: 01/14/21 Time DME Agency Contacted: 1157 Representative spoke with at DME Agency: Freda Munro HH Arranged: PT, OT [facility] Agency: Other - See comment [facility] Friend) Date [place]: 01/14/21 Time [place]: [place] Representative spoke with at [place]: [place] Arrangements/Services Living arrangements for the past 2 months: [place] with::  Spouse Patient language and need for interpreter reviewed:: Yes Do you feel safe going back to the place where you live?: Yes      Need for Family Participation in Patient Care: Yes (Comment) Care giver support system in place?: Yes (comment) Current home services: DME Criminal Activity/Legal Involvement Pertinent to Current Situation/Hospitalization: No - Comment as needed  Activities of Daily Living      Permission Sought/Granted   Permission granted to share information with : Yes, Verbal Permission Granted  Share Information with NAME: Wife [name] [id number]  Permission granted to share info w AGENCY: [facility]        Emotional Assessment Appearance:: Appears stated age Attitude/Demeanor/Rapport: Engaged Affect (typically observed): Accepting Orientation: : Oriented to Self, Oriented to Place, Oriented to  Time, Oriented to Situation Alcohol / Substance Use: Not Applicable Psych Involvement: No (comment)  Admission diagnosis:  Small bowel obstruction ([place]) [K56.609] Encounter for imaging study to confirm nasogastric (NG) tube placement [Z01.89] Partial small bowel obstruction ([place]) [K56.600] Patient Active Problem List   Diagnosis Date Noted   Partial small bowel obstruction ([place]) 01/13/2021   Acute abdomen    Positive depression screening 01/08/2021   Thyroid nodule 12/23/2020   Suspected Movement disorder- possibly PD  12/22/2020   Hematoma- Paraspinal from Fall  12/22/2020   Frequent falls 11/13/2020   Orthostatic dizziness 08/28/2020   Chronic pain of both lower extremities 07/17/2020   COPD  exacerbation ([place]) 05/08/2020   Statin myopathy 06/07/2019   Left-sided weakness 08/12/2018   Chronic obstructive pulmonary disease ([place]) 06/13/2018   Sleep apnea 09/16/2017   Testicular atrophy 04/23/2017   GERD (gastroesophageal reflux disease) 02/08/2015   IBS (irritable bowel syndrome) 02/08/2015   Essential hypertension 02/08/2015   AF (paroxysmal atrial  fibrillation) ([place]) 01/23/2015   CAD (coronary artery disease) 01/23/2015   History of CVA (cerebrovascular accident) 01/23/2015   PCP:  [name], MD Pharmacy[id number] [place] [id number]: [phone]: [phone] [facility] Portal to Registered [id number]: [phone]: [phone]    Social Determinants of Health (SDOH) Interventions    Readmission Risk Interventions No flowsheet data found.

## 2021-01-14 NOTE — Progress Notes (Signed)
Progress Note     Subjective: No complaints. Has had several bowel movements and significantly improved abdominal pain. No nausea or emesis  Objective: Vital signs in last 24 hours: Temp:  [97.7 F (36.5 C)-98.9 F (37.2 C)] 97.9 F (36.6 C) (08/09 0836) Pulse Rate:  [53-59] 58 (08/09 0836) Resp:  [16-20] 16 (08/09 0836) BP: (157-175)/(80-96) 157/88 (08/09 0836) SpO2:  [96 %-98 %] 98 % (08/09 0836) Last BM Date: 01/12/21 (yesterday morning accdg to patient)  Intake/Output from previous day: 08/08 0701 - 08/09 0700 In: 900 [I.V.:900] Out: -  Intake/Output this shift: No intake/output data recorded.  PE: General: pleasant, WD, male who is laying in bed in NAD HEENT: head is normocephalic, atraumatic. Mouth is pink and moist Heart: regular, rate, and rhythm.  Palpable radial pulses  Lungs: Respiratory effort nonlabored Abd: soft, +BS, mildly distended, no TTP MSK: all 4 extremities are symmetrical with no cyanosis, clubbing, or edema. Skin: warm and dry with no masses, lesions, or rashes Psych: A&Ox3 with an appropriate affect.    Lab Results:  Recent Labs    01/13/21 0205 01/14/21 0130  WBC 12.2* 8.0  HGB 14.9 13.0  HCT 44.9 39.0  PLT 222 188   BMET Recent Labs    01/13/21 0205 01/14/21 0130  NA 138 137  K 4.2 3.8  CL 102 103  CO2 29 27  GLUCOSE 164* 126*  BUN 13 13  CREATININE 1.28* 1.00  CALCIUM 9.4 8.5*   PT/INR Recent Labs    01/13/21 1305 01/14/21 0130  LABPROT 25.8* 27.4*  INR 2.4* 2.6*   CMP     Component Value Date/Time   NA 137 01/14/2021 0130   NA 138 12/23/2020 1112   K 3.8 01/14/2021 0130   CL 103 01/14/2021 0130   CO2 27 01/14/2021 0130   GLUCOSE 126 (H) 01/14/2021 0130   BUN 13 01/14/2021 0130   BUN 15 12/23/2020 1112   CREATININE 1.00 01/14/2021 0130   CREATININE 1.07 07/14/2019 1439   CALCIUM 8.5 (L) 01/14/2021 0130   PROT 7.0 01/13/2021 0205   PROT 6.2 12/23/2020 1112   ALBUMIN 3.7 01/13/2021 0205   ALBUMIN 3.7  12/23/2020 1112   AST 21 01/13/2021 0205   ALT 15 01/13/2021 0205   ALKPHOS 94 01/13/2021 0205   BILITOT 0.6 01/13/2021 0205   BILITOT 0.6 12/23/2020 1112   GFRNONAA >60 01/14/2021 0130   GFRAA >60 01/22/2020 1020   Lipase     Component Value Date/Time   LIPASE 28 01/13/2021 0205       Studies/Results: DG Abd Portable 1V-Small Bowel Obstruction Protocol-initial, 8 hr delay  Result Date: 01/13/2021 CLINICAL DATA:  Follow-up small bowel obstruction.  8 hour delay. EXAM: PORTABLE ABDOMEN - 1 VIEW COMPARISON:  CT earlier today. FINDINGS: Administered enteric contrast is seen within dilated small bowel, however there is contrast in the ascending and transverse colon. Central small bowel distension measures up to 4.5 cm. IMPRESSION: There is contrast in the ascending and transverse colon. Persistent central small bowel distention measuring up to 4.5 cm. Electronically Signed   By: Keith Rake M.D.   On: 01/13/2021 17:51   CT Angio Abd/Pel W and/or Wo Contrast  Result Date: 01/13/2021 CLINICAL DATA:  75 year old male woke from sleep with abdominal pain radiating to the back, diaphoretic. Abdominal bloating. EXAM: CT ANGIOGRAPHY CHEST, ABDOMEN AND PELVIS TECHNIQUE: Multidetector CT imaging through the chest, abdomen and pelvis was performed using the standard protocol during bolus administration of intravenous contrast.  Multiplanar reconstructed images and MIPs were obtained and reviewed to evaluate the vascular anatomy. CONTRAST:  125m OMNIPAQUE IOHEXOL 350 MG/ML SOLN COMPARISON:  Thoracic and lumbar MRI 11/13/2020. Chest radiographs 05/08/2020. FINDINGS: CTA CHEST FINDINGS Cardiovascular: Calcified coronary artery atherosclerosis (series 5, image 83). Comparatively mild thoracic aortic atherosclerosis. Negative for thoracic aortic aneurysm or dissection. Cardiomegaly. No pericardial effusion. Proximal great vessels are patent. Mediastinum/Nodes: Negative. No mediastinal lymphadenopathy or  hematoma. Lungs/Pleura: Atelectatic changes to the major airways which are otherwise patent. Mild mosaic attenuation in both lungs compatible with combined mild atelectasis and gas trapping. Mild additional dependent atelectasis. No pneumothorax, pleural effusion, or confluent pulmonary opacity. Musculoskeletal: There are healing fractures of the posterolateral left 11th and 12th ribs (series 6 image 157) with abundant periosteal new bone formation. Possible superimposed more recent nondisplaced fracture of the posterior 12th rib on series 6, image 165. Chronic fracture of the left 10th rib costovertebral junction. Chronic fractures of the anterior left 10th and 11th ribs. No other acute acute osseous abnormality identified. Review of the MIP images confirms the above findings. CTA ABDOMEN AND PELVIS FINDINGS VASCULAR Negative for abdominal aortic aneurysm or dissection. Mild-to-moderate Aortoiliac calcified atherosclerosis. Major aortic branches are patent including the SMA, and little to no SMA atherosclerosis is evident. 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 Bilateral iliac and proximal femoral arteries are patent. Mild to moderate proximal femoral artery atherosclerosis. Review of the MIP images confirms the above findings. NON-VASCULAR Hepatobiliary: Liver and gallbladder are within normal limits. Pancreas: Atrophied pancreas.  No active inflammation. Spleen: Spleen is within normal limits. Adrenals/Urinary Tract: Normal adrenal glands. Nonobstructed kidneys with symmetric renal enhancement. Small benign appearing right renal cyst. No nephrolithiasis is evident. Decompressed ureters. Diminutive urinary bladder. Multiple pelvic phleboliths mostly on the left. Stomach/Bowel: Mild retained stool in the rectum. Redundant sigmoid colon tracks into the mid right abdomen. Moderate sigmoid diverticulosis including in the central pelvis, but no convincing sigmoid wall thickening or mesenteric stranding.  Small volume of free fluid abuts the sigmoid colon. Intermittent diverticulosis of the upstream descending colon with no convincing inflammation. Fluid in nondilated transverse and right colon. Normal appendix on series 5, image 254. Decompressed terminal ileum. Decompressed small bowel in the right lateral abdomen. Fluid in nondistended stomach and duodenum. Proximal jejunum loops are within normal limits. But in the mid abdomen there are multiple dilated air and containing small bowel loops measuring up to about 37 mm diameter (coronal image 30). But these have a gradual transition both upstream and downstream to decompressed small bowel. However, there is mild inflammatory stranding in the small bowel mesentery visible on coronal image 63. No free air. No free fluid in the abdomen. No pneumoperitoneum identified. Lymphatic: No lymphadenopathy. Reproductive: Negative. Other: Small volume of simple density free fluid in the pelvis on series 5, image 274. No presacral or perirectal inflammatory stranding. Musculoskeletal: Healing fractures of the left L1 and L2 transverse processes with periosteal new bone formation (series 6, image 181). Stable lumbar vertebral height and alignment. Sacrum, SI joints, pelvis and proximal femurs appear intact. No other acute osseous abnormality identified. Review of the MIP images confirms the above findings. IMPRESSION: 1. Negative for aortic dissection or aneurysm. Aortic Atherosclerosis (ICD10-I70.0). Calcified coronary artery atherosclerosis. Cardiomegaly. 2. Small volume of free fluid in the pelvis with appearance of partial small bowel obstruction versus small bowel ileus in the mid abdomen, no abrupt transition point. Superimposed mild inflammatory stranding in the small bowel mesentery raising the possibility of Acute Enteritis. The  relatively mild atherosclerosis of abdominal aortic branches argues against bowel ischemia. Normal appendix. Diverticulosis of the descending  and sigmoid colon without convincing active large bowel inflammation. 3. Healing multiple left lower rib fractures and left L1 and L2 transverse process fractures. The left 12th rib might be fractured in 2 places. 4. Negative for retroperitoneal hematoma. No other acute or inflammatory process identified in the chest, abdomen, or pelvis. Electronically Signed   By: Genevie Ann M.D.   On: 01/13/2021 04:20    Anti-infectives: Anti-infectives (From admission, onward)    None        Assessment/Plan  63M with pSBO vs ileus - abd xray with contrast in colon - having multiple bowel movements - start fulls  FEN: FLD, IVF per primary ID: none VTE warfarin  Disposition: having bowel function. No acute surgical needs at this time and we will sign off. Please do not hesitate to reach out to [place] as needed    LOS: 0 days    Winferd Humphrey, [facility] 01/14/2021, 8:49 AM Please see Amion for pager number during day hours 7:00am-4:30pm

## 2021-01-14 NOTE — Progress Notes (Signed)
Discharge instructions explained and given to patient and his wife. IV removed. Wife assisted patient with getting dressed. Patient wheeled down to ride home with his wife.

## 2021-01-14 NOTE — Evaluation (Addendum)
Occupational Therapy Evaluation Patient Details Name: [name] MRN: [id number] DOB: 12-26-1945 Today's Date: 01/14/2021    History of Present Illness Pt is a 75 yo male presenting to [facility] ED on 8/8 with chief complaint of stomach pain. CT suggestive of partial  small bowel obstruction vs ileus as well as prior rib fractures. Of note, workup underway for Parkinson's-like symptoms at [facility]. PMH: history of CVA, MI, Afib, HTN, CAD COPD, IBD.   Clinical Impression   Pt presents with decline in function and safety with ADLs and ADL mobility with impaired strength, balance and endurance. PTA pt lived at home with his wife and was Ind with mostly ADLs, some min A with LB due to balance impairments and used a RW for mobility, was driving; pt also has a w/c, BSC ad shower chair with grab bars at home. Pt currently requires mod A with LB ADLs, min guard A for grooming task standing and min - min guard A with mobility using RW. Pt would benefit from acute OT services to address impairments to maximize level of function and safety    Follow Up Recommendations  Home health OT    Equipment Recommendations  Other (comment) Management consultant)    Recommendations for Other Services       Precautions / Restrictions Precautions Precautions: Fall Restrictions Weight Bearing Restrictions: No      Mobility Bed Mobility Overal bed mobility: Needs Assistance Bed Mobility: Supine to Sit     Supine to sit: Min guard;HOB elevated          Transfers Overall transfer level: Needs assistance Equipment used: Rolling walker (2 wheeled) Transfers: Sit to/from Stand Sit to Stand: Min assist;Min guard         General transfer comment: min A from bed, min guard A from 3 in 1    Balance Overall balance assessment: Needs assistance;History of Falls Sitting-balance support: Feet supported Sitting balance-Leahy Scale: Fair Sitting balance - Comments: Pt reported that this is baseline. Min guard A for sitting  balance Postural control: Left lateral lean Standing balance support: Bilateral upper extremity supported;During functional activity Standing balance-Leahy Scale: Poor                             ADL either performed or assessed with clinical judgement   ADL Overall ADL's : Needs assistance/impaired Eating/Feeding: Set up;Independent;Sitting   Grooming: Wash/dry hands;Wash/dry face;Min guard;Standing;With caregiver independent assisting   Upper Body Bathing: Set up;Supervision/ safety;Sitting;With caregiver independent assisting   Lower Body Bathing: Moderate assistance;With caregiver independent assisting   Upper Body Dressing : Set up;Supervision/safety;Sitting;With caregiver independent assisting   Lower Body Dressing: Moderate assistance;With caregiver independent assisting   Toilet Transfer: Minimal assistance;Min guard;Ambulation;RW;BSC;Cueing for safety;With caregiver independent assisting   Toileting- Clothing Manipulation and Hygiene: Minimal assistance;Sit to/from stand;With caregiver independent assisting   Tub/ Shower Transfer: Minimal assistance;Ambulation;3 in 1;Cueing for safety;With caregiver independent assisting;Rolling walker   Functional mobility during ADLs: Minimal assistance;Min guard;Rolling walker;Cueing for safety;Caregiver able to provide necessary level of assistance       Vision Baseline Vision/History: Wears glasses Wears Glasses: At all times Patient Visual Report: No change from baseline       Perception     Praxis      Pertinent Vitals/Pain Pain Assessment: No/denies pain Faces Pain Scale: No hurt Pain Intervention(s): Monitored during session;Repositioned     Hand Dominance Right   Extremity/Trunk Assessment Upper Extremity Assessment Upper Extremity Assessment: Generalized weakness LUE  Deficits / Details: Pt reports left side is weaker from previous CVA. LUE Sensation: WNL LUE Coordination: decreased gross motor    Lower Extremity Assessment Lower Extremity Assessment: Defer to PT evaluation   Cervical / Trunk Assessment Cervical / Trunk Assessment: Normal   Communication Communication Communication: No difficulties   Cognition Arousal/Alertness: Awake/alert Behavior During Therapy: WFL for tasks assessed/performed Overall Cognitive Status: No family/caregiver present to determine baseline cognitive functioning                                 General Comments: Pt has history of CVA.   General Comments       Exercises     Shoulder Instructions      Home Living Family/patient expects to be discharged to:: Private residence Living Arrangements: Spouse/significant other Available Help at Discharge: Family;Available 24 hours/day Type of Home: House Home Access: Level entry     Home Layout: Two level;Able to live on main level with bedroom/bathroom     Bathroom Shower/Tub: Occupational psychologist: Standard Bathroom Accessibility: Yes   Home Equipment: Walker - 2 wheels;Grab bars - tub/shower;Shower seat;Wheelchair - manual;Cane - single point;Toilet riser;Other (comment) (urinal)          Prior Functioning/Environment Level of Independence: Needs assistance  Gait / Transfers Assistance Needed: Utilizes RW at home for gait for short distances. Has recently used WC and reports it is too big and heavy to use at home. Pt reported four major falls within the past year. ADL's / Homemaking Assistance Needed: Pt reports wife helps mostly with balance for LB ADLs            OT Problem List: Decreased strength;Impaired balance (sitting and/or standing);Decreased safety awareness;Decreased activity tolerance;Decreased knowledge of use of DME or AE;Decreased coordination      OT Treatment/Interventions: Self-care/ADL training;Patient/family education;Therapeutic exercise;Therapeutic activities;Balance training;DME and/or AE instruction    OT Goals(Current goals  can be found in the care plan section) Acute Rehab OT Goals Patient Stated Goal: to go home OT Goal Formulation: With patient/family Time For Goal Achievement: 01/28/21 ADL Goals Pt Will Perform Grooming: with supervision;with set-up;standing;with caregiver independent in assisting Pt Will Perform Upper Body Bathing: with set-up;with modified independence;with caregiver independent in assisting Pt Will Perform Lower Body Bathing: with min assist;with min guard assist;sit to/from stand;with caregiver independent in assisting Pt Will Perform Upper Body Dressing: with supervision;with modified independence;sitting;with caregiver independent in assisting Pt Will Perform Lower Body Dressing: with min assist;with min guard assist;with caregiver independent in assisting Pt Will Transfer to Toilet: with min guard assist;with supervision;ambulating Pt Will Perform Toileting - Clothing Manipulation and hygiene: with min guard assist;with supervision;sit to/from stand;with caregiver independent in assisting Pt Will Perform Tub/Shower Transfer: with min guard assist;ambulating;shower seat;3 in 1;rolling walker  OT Frequency: Min 2X/week   Barriers to D/C:            Co-evaluation              AM-PAC OT "6 Clicks" Daily Activity     Outcome Measure Help from another person eating meals?: None Help from another person taking care of personal grooming?: A Little Help from another person toileting, which includes using toliet, bedpan, or urinal?: A Little Help from another person bathing (including washing, rinsing, drying)?: A Little Help from another person to put on and taking off regular upper body clothing?: None Help from another person to put on and  taking off regular lower body clothing?: A Little 6 Click Score: 20   End of Session Equipment Utilized During Treatment: Gait belt;Rolling walker;Other (comment) (3 in 1)  Activity Tolerance: Patient tolerated treatment well Patient left:  in chair;with call bell/phone within reach;with chair alarm set;with family/visitor present  OT Visit Diagnosis: Other abnormalities of gait and mobility (R26.89);History of falling (Z91.81);Unsteadiness on feet (R26.81);Muscle weakness (generalized) (M62.81)                Time: [id number] OT Time Calculation (min): 27 min Charges:  OT General Charges $OT Visit: 1 Visit OT Evaluation $OT Eval Moderate Complexity: 1 Mod OT Treatments $Self Care/Home Management : 8-22 mins    Britt Bottom 01/14/2021, 12:24 PM

## 2021-01-14 NOTE — Discharge Instructions (Signed)
Dear [name],  Thank you for letting us participate in your care. You were hospitalized for abdominal pain and diagnosed with a partial small bowel obstruction. You were treated with bowel rest. Repeat imaging did not show any obstruction. We are glad that you are doing better!   POST-HOSPITAL & CARE INSTRUCTIONS Return for any severe abdominal pain, nausea, vomiting, or inability to pass gas. We decreased the dose one of your blood pressure medications (Bisoprolol) to 2.5 mg. This is because your heart rate here was slightly low and this medication can decrease it. Follow up with your PCP below for further monitoring/adjustments if necessary.  Go to your follow up appointments (listed below)   DOCTOR'S APPOINTMENT   Future Appointments  Date Time Provider [place]  01/17/2021  1:30 PM [name], MD [facility]    Follow-up Information     [name], MD. Go on 01/17/2021.   Specialty: Family Medicine Why: Go to your scheduled appointment at 1:30PM. Contact information: [place] [address] [phone]         Jettie Booze, MD .   Specialties: Cardiology, Radiology, Interventional Cardiology Contact information: [id number] [place] [address] [phone]                 Take care and be well!  [facility]  [place], [place] [address] [phone]

## 2021-01-14 NOTE — Plan of Care (Signed)

## 2021-01-14 NOTE — Discharge Summary (Addendum)
[facility] Discharge Summary  Patient name: [name] Medical record number: [id number] Date of birth: 12-15-1945 Age: 75 y.o. Gender: male Date of Admission: 01/13/2021  Date of Discharge: 01/14/2021 Admitting Physician: [name], MD  Primary Care Provider: [name], MD Consultants: Surgery  Indication for Hospitalization: Abdominal pain  Discharge Diagnoses/Problem List:  Active Problems:   Partial small bowel obstruction [facility])    Disposition: Home  Discharge Condition: Stable  Discharge Exam:  Blood pressure (!) 159/84, pulse (!) 56, temperature 98.2 F (36.8 C), temperature source Oral, resp. rate 18, height 6' (1.829 m), weight 77.1 kg, SpO2 97 %.  General: Awake, alert and appropriately responsive in NAD Chest: CTAB, normal WOB. Good air movement bilaterally.   Heart: RRR, normal S1, S2. No murmur appreciated Abdomen: Soft, non-tender, non-distended. Normoactive bowel sounds. No HSM appreciated.  Extremities: Extremities warm and well-perfused. Moves all extremities equally. MSK: Normal bulk and tone Neuro: Appropriately responsive to stimuli. No gross deficits appreciated.  Skin: No rashes or lesions appreciated.    Brief Hospital Course:  Carley Cockerell is a 75 year old with a history of A. 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 His hospital course is detailed below:  Abdominal pain Patient woke from his sleep with 10/10 abdominal pain in bandlike distribution with nausea.  In the ED patient was hemodynamically stable with unremarkable vital signs.  Patient notably had creatinine bump to 1.28 and leukocytosis to 12.2.  CT angiogram noted partial small bowel obstruction versus ileus in the mid abdomen without abrupt transition point and questionable  acute enteritis.  Patient was placed n.p.o. with Gastrografin study placed per general surgery.  Gastrografin study noted persistent central small bowel distention measuring up to 4.5 cm but did show contrast in the ascending and transverse colon.  Patient had several bowel movements with significantly improved abdominal pain.  His WBC improved to 8.0 and creatinine improved 1.0.  General surgery advised he start back on regular diet.  Patient tolerated regular diet well and was without pain upon discharge.  Bradycardia  hypertension As patient's creatinine was increased, lisinopril medication was held initially during admission.  Patient was notably borderline bradycardic in the 50s (asymptomatic) throughout hospital course with notable hypertension up to 170s over 90s.  He remained asymptomatic and when creatinine improved was improved he was restarted on lisinopril. Given his bradycardia, his dosage of bisoprolol was decreased from 15 mg to 2.5 mg.  Recommend adjusting blood pressure medications as necessary.  PCP Follow Up Recommendations Unclear why patient is on Warfarin. Consider transitioning from Warfarin to [place] for anticoagulation. Bisoprolol decreased from 15 mg to 2.5 mg given bradycardia to 50's. Continue to adjust as necessary.  Significant Procedures: None  Significant Labs and Imaging:  Recent Labs  Lab 01/13/21 0205 01/14/21 0130  WBC 12.2* 8.0  HGB 14.9 13.0  HCT 44.9 39.0  PLT 222 188   Recent Labs  Lab 01/13/21 0205 01/14/21 0130  NA 138 137  K 4.2 3.8  CL 102 103  CO2 29 27  GLUCOSE 164* 126*  BUN 13 13  CREATININE 1.28* 1.00  CALCIUM 9.4 8.5*  ALKPHOS 94  --   AST 21  --   ALT 15  --   ALBUMIN 3.7  --    Results/Tests Pending at Time of Discharge: None  Discharge Medications:  Allergies as of 01/14/2021       Reactions   Bactrim [sulfamethoxazole-trimethoprim] Anaphylaxis   Ulcers   Ciprofloxacin Swelling   Tongue   Crestor [rosuvastatin Calcium]     Severe Myalgias        Medication List     TAKE these medications    acetaminophen 325 MG tablet Commonly known as: TYLENOL Take 2 tablets (650 mg total) by mouth every 6 (six) hours as needed for mild pain or moderate pain. What changed:  medication strength how much to take reasons to take this   amiodarone 200 MG tablet Commonly known as: PACERONE Take 1 tablet (200 mg total) by mouth daily.   aspirin 81 MG tablet Take 81 mg by mouth daily with supper.   bisoprolol 5 MG tablet Commonly known as: ZEBETA Take 0.5 tablets (2.5 mg total) by mouth daily. Start taking on: January 15, 2021 What changed:  medication strength how much to take   carbidopa-levodopa 25-100 MG tablet Commonly known as: SINEMET IR Take 1 tablet by mouth 3 (three) times daily. Times: 6AM, 2PM, & 10PM   diphenhydramine-acetaminophen 25-500 MG Tabs tablet Commonly known as: TYLENOL PM Take 2 tablets by mouth at bedtime as needed (leg pain).   DULoxetine 20 MG capsule Commonly known as: Cymbalta Take 2 capsules (40 mg total) by mouth daily.   fluticasone 50 MCG/ACT nasal spray Commonly known as: FLONASE Place 1 spray into both nostrils at bedtime as needed (stuffy nose).   lisinopril 40 MG tablet Commonly known as: ZESTRIL Take 1 tablet (40 mg total) by mouth daily with supper. For blood pressure.   multivitamin with minerals tablet Take 1 tablet by mouth daily with supper. Centrum Silver   nitroGLYCERIN 0.4 MG SL tablet Commonly known as: NITROSTAT Place 1 tablet (0.4 mg total) every 5 (five) minutes as needed under the tongue for chest pain.   omeprazole 40 MG capsule Commonly known as: PRILOSEC TAKE 1 CAPSULE (40 MG TOTAL) BY MOUTH 2 (TWO) TIMES DAILY BEFORE A MEAL. FOR HEARTBURN. What changed: See the new instructions.   Praluent 75 MG/ML Soaj Generic drug: Alirocumab Inject 75 mg into the skin every 14 (fourteen) days.   pravastatin 80 MG tablet Commonly known as:  PRAVACHOL Take 1 tablet (80 mg total) by mouth daily. For cholesterol.   psyllium 95 % Pack Commonly known as: HYDROCIL/METAMUCIL Take 1 packet by mouth daily.   tiZANidine 4 MG tablet Commonly known as: ZANAFLEX Take 1 tablet (4 mg total) by mouth at bedtime as needed for muscle spasms. What changed: See the new instructions.   triamcinolone cream 0.1 % Commonly known as: KENALOG Apply 1 application topically daily as needed (rash around nose).   vitamin B-12 1000 MCG tablet Commonly known as: CYANOCOBALAMIN Take 1,000 mcg by mouth daily with supper.   warfarin 7.5 MG tablet Commonly known as: COUMADIN Take as directed. If you are unsure how to take this medication, talk to your nurse or doctor. Original instructions: Take 3.75-7.5 mg by mouth See admin instructions. Take 1 tablet (7.'5mg'$  ) by mouth on Sun and Thurs. All other days take 1/2 tablet (3.'75mg'$ ) at supper time               Durable Medical Equipment  (From admission, onward)           Start     Ordered   01/14/21 1150  For home use only DME Other see comment  Once  Comments: Back cushion for 18 inch wheel chair  Question:  Length of Need  Answer:  Lifetime   01/14/21 1149            Discharge Instructions: Please refer to Patient Instructions section of EMR for full details.  Patient was counseled important signs and symptoms that should prompt return to medical care, changes in medications, dietary instructions, activity restrictions, and follow up appointments.   Follow-Up Appointments:  Follow-up Information     [name], MD. Go on 01/17/2021.   Specialty: Family Medicine Why: Go to your scheduled appointment at 1:30PM. Contact information: [place] [address] [phone]         Jettie Booze, MD .   Specialties: Cardiology, Radiology, Interventional Cardiology Contact information: [id number] [address] [place]  [address] [phone]                Wells Guiles, DO 01/14/2021, 3:54 PM PGY-1, [place] Upper-Level Resident Addendum   I have independently interviewed and examined the patient. I have discussed the above with the original author and agree with their documentation. My edits for correction/addition/clarification are included where appropriate. Please see also any attending notes.   Sharion Settler, DO PGY-2, [facility] 01/14/2021 4:10 PM  FPTS Service pager: [phone] (text pages welcome through [place])

## 2021-01-16 DIAGNOSIS — Z955 Presence of coronary angioplasty implant and graft: Secondary | ICD-10-CM | POA: Diagnosis not present

## 2021-01-16 DIAGNOSIS — K219 Gastro-esophageal reflux disease without esophagitis: Secondary | ICD-10-CM | POA: Diagnosis not present

## 2021-01-16 DIAGNOSIS — W19XXXD Unspecified fall, subsequent encounter: Secondary | ICD-10-CM | POA: Diagnosis not present

## 2021-01-16 DIAGNOSIS — I10 Essential (primary) hypertension: Secondary | ICD-10-CM | POA: Diagnosis not present

## 2021-01-16 DIAGNOSIS — I69354 Hemiplegia and hemiparesis following cerebral infarction affecting left non-dominant side: Secondary | ICD-10-CM | POA: Diagnosis not present

## 2021-01-16 DIAGNOSIS — I4891 Unspecified atrial fibrillation: Secondary | ICD-10-CM | POA: Diagnosis not present

## 2021-01-16 DIAGNOSIS — I252 Old myocardial infarction: Secondary | ICD-10-CM | POA: Diagnosis not present

## 2021-01-17 ENCOUNTER — Encounter: Payer: Self-pay | Admitting: Family Medicine

## 2021-01-17 ENCOUNTER — Ambulatory Visit (INDEPENDENT_AMBULATORY_CARE_PROVIDER_SITE_OTHER): Payer: Medicare HMO | Admitting: Family Medicine

## 2021-01-17 ENCOUNTER — Other Ambulatory Visit: Payer: Self-pay

## 2021-01-17 VITALS — BP 122/70 | HR 54 | Ht 72.0 in

## 2021-01-17 DIAGNOSIS — I69354 Hemiplegia and hemiparesis following cerebral infarction affecting left non-dominant side: Secondary | ICD-10-CM | POA: Diagnosis not present

## 2021-01-17 DIAGNOSIS — R350 Frequency of micturition: Secondary | ICD-10-CM

## 2021-01-17 DIAGNOSIS — G259 Extrapyramidal and movement disorder, unspecified: Secondary | ICD-10-CM

## 2021-01-17 DIAGNOSIS — Z955 Presence of coronary angioplasty implant and graft: Secondary | ICD-10-CM | POA: Diagnosis not present

## 2021-01-17 DIAGNOSIS — R110 Nausea: Secondary | ICD-10-CM | POA: Diagnosis not present

## 2021-01-17 DIAGNOSIS — R69 Illness, unspecified: Secondary | ICD-10-CM | POA: Diagnosis not present

## 2021-01-17 DIAGNOSIS — I252 Old myocardial infarction: Secondary | ICD-10-CM | POA: Diagnosis not present

## 2021-01-17 DIAGNOSIS — K219 Gastro-esophageal reflux disease without esophagitis: Secondary | ICD-10-CM | POA: Diagnosis not present

## 2021-01-17 DIAGNOSIS — F339 Major depressive disorder, recurrent, unspecified: Secondary | ICD-10-CM

## 2021-01-17 DIAGNOSIS — I10 Essential (primary) hypertension: Secondary | ICD-10-CM | POA: Diagnosis not present

## 2021-01-17 DIAGNOSIS — W19XXXD Unspecified fall, subsequent encounter: Secondary | ICD-10-CM | POA: Diagnosis not present

## 2021-01-17 DIAGNOSIS — I4891 Unspecified atrial fibrillation: Secondary | ICD-10-CM | POA: Diagnosis not present

## 2021-01-17 MED ORDER — ONDANSETRON 4 MG PO TBDP: 4.0000 mg | ORAL_TABLET | Freq: Three times a day (TID) | ORAL | 0 refills | Status: DC | PRN

## 2021-01-17 NOTE — Progress Notes (Signed)
SUBJECTIVE:   CHIEF COMPLAINT / HPI:   Depression Patient with a long history of mild depression which has been exacerbated recently due to his worsening health issues.  He was previously independent, enjoyed gardening, but now is mostly wheelchair bound due to a PD-like movement disorder. He endorses some depressed mood and thoughts that it would be ok if he didn't wake up. No active SI whatsoever. Currently on duloxetine '40mg'$  daily. Tried '60mg'$  in the past but reportedly did not tolerate. He also tried a different depression medication in the remote past (unsure of the name) which caused him insomnia, severe hallucinations and behavioral disturbances.   Urinary Symptoms Patient reports frequent urination. He also endorses urgency. Symptoms seem to be worse at night than during the day. He will urinate up to 6 times per night and it's interfering with his sleep. This significantly disrupts his quality of life, especially because his mobility is limited. Denies any hesitancy, dribbling or weak stream. Symptoms have been present for several months.   Nausea Patient with persistent nausea ever since his recent admission for partial SBO. He feels it has been worse since he had to drink the material for the gastrograffin study. No vomiting. Passing stool without difficulty.   PERTINENT  PMH / PSH: movement disorder, retroperitoneal hematoma, HTN, HLD, CAD, prior CVA  OBJECTIVE:   BP 122/70   Pulse (!) 54   Ht 6' (1.829 m)   SpO2 99%   BMI 23.06 kg/m   General: NAD, pleasant, able to participate in exam Cardiac: RRR, S1 S2 present. normal heart sounds, no murmurs. Respiratory: CTAB, normal effort, No wheezes, rales or rhonchi Abdomen: Bowel sounds present, non-tender, non-distended, no hepatosplenomegaly Extremities: no edema Neuro: alert, no obvious focal deficits, 5/5 strength in all extremities Psych: Normal affect   ASSESSMENT/PLAN:   Depression, recurrent ([place]) Patient  endorsing depressed mood and PHQ-9 score is elevated. He would be ok with not waking up, but denies SI. Unfortunately he has had several recent health issues that have caused him to lose his independence and this is a large contributing factor to his current depression. -He and his wife [place] to see which anti-depressant he did not tolerate in the past -Pending this, will likely switch from Cymbalta to Celexa or add Bupropion. Will discuss with pharmacy team. -Patient is open to counseling. CCM referral placed to assist patient in establishing with a therapist   Urinary frequency Patient with several months of urinary frequency and urgency. UA was normal on admission to the hospital earlier this week and kidney function is overall wnl (Cr 1.00, GFR >60). Symptoms are atypical for BPH. Given his underlying movement disorder, I'm suspicious for a neuro-related etiology or overactive bladder. As this is greatly impacting his quality of life, he would benefit from urology referral to better understand the etiology and guide treatment accordingly. Urology referral placed today.   Nausea Patient with persistent nausea after gastrografin study. May also be a viral gastroenteritis as he had some loose stool and evidence of enteritis on his CT earlier this week. No vomiting. I am not worried about intracranial etiology. Rx sent for Zofran ODT prn.  Complex Care Needs CCM referral placed today as patient has multiple medical problems requiring various specialists, home health, assistive devices (wheelchair), etc. He would benefit from extra support in coordinating his care. Specifically he would like help finding a good therapist and obtaining the proper wheelchair (current one is  bulky, digs into his shoulders).  [name], MD [place]

## 2021-01-17 NOTE — Patient Instructions (Addendum)
It was great to see you!  Things we discussed at today's visit: - I have placed a referral to urology to address your urinary symptoms. Someone will call you to schedule this appointment. - I will wait to hear from you about depression medications you tried in the past that did not work well. Pending this, we will adjust your medications - I will look into a therapist recommendation for you. - Someone from our office will call you to check in about your wheelchair situation.  - I have sent Zofran to your pharmacy. This is a medication to help with nausea. It can be used every 8 hours as needed. If you have used all the tablets and are still having nausea, please let us know. - Keep up the good work with physical therapy!  Take care and seek immediate care sooner if you develop any concerns.  Dr. [facility]

## 2021-01-18 DIAGNOSIS — R350 Frequency of micturition: Secondary | ICD-10-CM | POA: Insufficient documentation

## 2021-01-18 DIAGNOSIS — F339 Major depressive disorder, recurrent, unspecified: Secondary | ICD-10-CM | POA: Insufficient documentation

## 2021-01-18 NOTE — Assessment & Plan Note (Addendum)
Patient with several months of urinary frequency and urgency. UA was normal on admission to the hospital earlier this week and kidney function is overall wnl (Cr 1.00, GFR >60). Symptoms are atypical for BPH. Given his underlying movement disorder, I'm suspicious for a neuro-related etiology or overactive bladder. As this is greatly impacting his quality of life, he would benefit from urology referral to better understand the etiology and guide treatment accordingly. Urology referral placed today.

## 2021-01-18 NOTE — Assessment & Plan Note (Signed)
Patient endorsing depressed mood and PHQ-9 score is elevated. He would be ok with not waking up, but denies SI. Unfortunately he has had several recent health issues that have caused him to lose his independence and this is a large contributing factor to his current depression. -He and his wife will [place] at [place] to see which anti-depressant he did not tolerate in the past -Pending this, will likely switch from Cymbalta to Celexa or add Bupropion. Will discuss with pharmacy team. -Patient is open to counseling. CCM referral placed to assist patient in establishing with a therapist

## 2021-01-20 ENCOUNTER — Telehealth: Payer: Self-pay

## 2021-01-20 NOTE — Telephone Encounter (Signed)
Patient calls nurse line to report that he does not have previous records of anti depressants he has tried in the past.   Patient was able to report that he tried Celexa back in 2013.   Patient is also requesting podiatry referral to have toe nails clipped.   To PCP  Talbot Grumbling, RN

## 2021-01-21 DIAGNOSIS — I69354 Hemiplegia and hemiparesis following cerebral infarction affecting left non-dominant side: Secondary | ICD-10-CM | POA: Diagnosis not present

## 2021-01-21 DIAGNOSIS — I4891 Unspecified atrial fibrillation: Secondary | ICD-10-CM | POA: Diagnosis not present

## 2021-01-21 DIAGNOSIS — I10 Essential (primary) hypertension: Secondary | ICD-10-CM | POA: Diagnosis not present

## 2021-01-21 DIAGNOSIS — K219 Gastro-esophageal reflux disease without esophagitis: Secondary | ICD-10-CM | POA: Diagnosis not present

## 2021-01-21 DIAGNOSIS — W19XXXD Unspecified fall, subsequent encounter: Secondary | ICD-10-CM | POA: Diagnosis not present

## 2021-01-21 DIAGNOSIS — I252 Old myocardial infarction: Secondary | ICD-10-CM | POA: Diagnosis not present

## 2021-01-21 DIAGNOSIS — Z955 Presence of coronary angioplasty implant and graft: Secondary | ICD-10-CM | POA: Diagnosis not present

## 2021-01-22 ENCOUNTER — Other Ambulatory Visit: Payer: Self-pay

## 2021-01-22 ENCOUNTER — Ambulatory Visit (INDEPENDENT_AMBULATORY_CARE_PROVIDER_SITE_OTHER): Payer: Medicare HMO

## 2021-01-22 DIAGNOSIS — Z7901 Long term (current) use of anticoagulants: Secondary | ICD-10-CM

## 2021-01-22 DIAGNOSIS — R002 Palpitations: Secondary | ICD-10-CM | POA: Diagnosis not present

## 2021-01-22 LAB — POCT INR: INR: 3.3 — AB (ref 2.0–3.0)

## 2021-01-22 NOTE — Patient Instructions (Signed)
Description   Hold today's dose and then continue taking 1/2 tablet daily except for 1 tablet on Sundays and Thursdays.  Recheck INR in 2 weeks. [facility] [phone].

## 2021-01-23 ENCOUNTER — Telehealth: Payer: Self-pay | Admitting: *Deleted

## 2021-01-23 DIAGNOSIS — I10 Essential (primary) hypertension: Secondary | ICD-10-CM | POA: Diagnosis not present

## 2021-01-23 DIAGNOSIS — Z955 Presence of coronary angioplasty implant and graft: Secondary | ICD-10-CM | POA: Diagnosis not present

## 2021-01-23 DIAGNOSIS — I69354 Hemiplegia and hemiparesis following cerebral infarction affecting left non-dominant side: Secondary | ICD-10-CM | POA: Diagnosis not present

## 2021-01-23 DIAGNOSIS — W19XXXD Unspecified fall, subsequent encounter: Secondary | ICD-10-CM | POA: Diagnosis not present

## 2021-01-23 DIAGNOSIS — I4891 Unspecified atrial fibrillation: Secondary | ICD-10-CM | POA: Diagnosis not present

## 2021-01-23 DIAGNOSIS — I252 Old myocardial infarction: Secondary | ICD-10-CM | POA: Diagnosis not present

## 2021-01-23 DIAGNOSIS — K219 Gastro-esophageal reflux disease without esophagitis: Secondary | ICD-10-CM | POA: Diagnosis not present

## 2021-01-23 NOTE — Chronic Care Management (AMB) (Signed)
  Care Management   Outreach Note  01/23/2021 Name: [name] MRN: [id number] DOB: Apr 30, 1946  Referred by: [name], MD Reason for referral : Care Coordination (Initial outreach to schedule referral with Licensed Clinical SW)   An unsuccessful telephone outreach was attempted today. The patient was referred to the case management team for assistance with care management and care coordination.   Follow Up Plan:  A HIPAA compliant phone message was left for the patient providing contact information and requesting a return call. The care management team will reach out to the patient again over the next 7 days.  If patient returns call to provider office, please advise to call [place] at [phone].  [facility] Management  Direct Dial: [phone]

## 2021-01-23 NOTE — Chronic Care Management (AMB) (Signed)
  Care Management   Note  01/23/2021 Name: [name] MRN: [id number] DOB: [birth year]  [name] is a 75 y.o. year old male who is a primary care patient of [name], MD. I reached out to [name] by phone today in response to a referral sent by [name]'s Dr. [name].    [name] was given information about care management services today including:  Care management services include personalized support from designated clinical staff supervised by his physician, including individualized plan of care and coordination with other care providers 24/7 contact phone numbers for assistance for urgent and routine care needs. The patient may stop care management services at any time by phone call to the office staff.  Patient agreed to services and verbal consent obtained.   Follow up plan: Telephone appointment with care management team member scheduled for:01/30/21  [facility] Management  Direct Dial: [phone]

## 2021-01-28 ENCOUNTER — Other Ambulatory Visit: Payer: Self-pay | Admitting: Family Medicine

## 2021-01-28 DIAGNOSIS — I10 Essential (primary) hypertension: Secondary | ICD-10-CM | POA: Diagnosis not present

## 2021-01-28 DIAGNOSIS — W19XXXD Unspecified fall, subsequent encounter: Secondary | ICD-10-CM | POA: Diagnosis not present

## 2021-01-28 DIAGNOSIS — I69354 Hemiplegia and hemiparesis following cerebral infarction affecting left non-dominant side: Secondary | ICD-10-CM | POA: Diagnosis not present

## 2021-01-28 DIAGNOSIS — I252 Old myocardial infarction: Secondary | ICD-10-CM | POA: Diagnosis not present

## 2021-01-28 DIAGNOSIS — Z955 Presence of coronary angioplasty implant and graft: Secondary | ICD-10-CM | POA: Diagnosis not present

## 2021-01-28 DIAGNOSIS — K219 Gastro-esophageal reflux disease without esophagitis: Secondary | ICD-10-CM | POA: Diagnosis not present

## 2021-01-28 DIAGNOSIS — I4891 Unspecified atrial fibrillation: Secondary | ICD-10-CM | POA: Diagnosis not present

## 2021-01-28 NOTE — Telephone Encounter (Signed)
Patient calls nurse line stating he still has not heard back about depression medications. Patient reports undesired side effects to Celexa, however has not tried any other anti depressants.   Patient advised PCP is on vacation this week and will return on Monday.

## 2021-01-30 ENCOUNTER — Ambulatory Visit: Payer: Medicare HMO | Admitting: Licensed Clinical Social Worker

## 2021-01-30 DIAGNOSIS — I69354 Hemiplegia and hemiparesis following cerebral infarction affecting left non-dominant side: Secondary | ICD-10-CM | POA: Diagnosis not present

## 2021-01-30 DIAGNOSIS — K219 Gastro-esophageal reflux disease without esophagitis: Secondary | ICD-10-CM | POA: Diagnosis not present

## 2021-01-30 DIAGNOSIS — I10 Essential (primary) hypertension: Secondary | ICD-10-CM | POA: Diagnosis not present

## 2021-01-30 DIAGNOSIS — W19XXXD Unspecified fall, subsequent encounter: Secondary | ICD-10-CM | POA: Diagnosis not present

## 2021-01-30 DIAGNOSIS — Z955 Presence of coronary angioplasty implant and graft: Secondary | ICD-10-CM | POA: Diagnosis not present

## 2021-01-30 DIAGNOSIS — I4891 Unspecified atrial fibrillation: Secondary | ICD-10-CM | POA: Diagnosis not present

## 2021-01-30 DIAGNOSIS — Z7189 Other specified counseling: Secondary | ICD-10-CM

## 2021-01-30 DIAGNOSIS — I252 Old myocardial infarction: Secondary | ICD-10-CM | POA: Diagnosis not present

## 2021-01-30 MED ORDER — BUPROPION HCL ER (XL) 150 MG PO TB24: 150.0000 mg | ORAL_TABLET | Freq: Every day | ORAL | 1 refills | Status: DC

## 2021-01-30 NOTE — Telephone Encounter (Signed)
Patient calls nurse line to report that bupropion is too expensive and would like to try alternative.   Please advise.   Talbot Grumbling, RN

## 2021-01-30 NOTE — Patient Instructions (Signed)
Licensed Clinical Social Worker Visit Information  Goals we discussed today:   Goals Addressed             This Visit's Progress    Begin and Stick with Counseling-Depression       Timeframe:  Short-Term Goal Priority:  High Start Date:   01/30/21                          Expected End Date:                       Follow Up Date: 02/05/21     Patient Goals/Self-Care Activities: Over the next 14 days Call your insurance provider to inquire about co-pay for therapy Call your insurance provider for more information about your Enhanced Benefits  Continue with compliance of taking medication  Why is this important?   Beating depression may take some time.        :   Mr. Callo was given information about Care Management services today including:  Care Management services include personalized support from designated clinical staff supervised by his physician, including individualized plan of care and coordination with other care providers 24/7 contact phone numbers for assistance for urgent and routine care needs. The patient may stop Care Management services at any time by phone call to the office staff.   Patient agreed to services and verbal consent obtained.   Patient verbalizes understanding of instructions provided today and agrees to view in [place].   Follow up plan: Appointment scheduled for SW follow up with client by phone on: 02/05/21  Casimer Lanius, [facility] Coordination  [phone]

## 2021-01-30 NOTE — Chronic Care Management (AMB) (Signed)
Care Management Clinical Social Work Note  01/30/2021 Name: [name] MRN: [id number] DOB: 1945/08/31  [name] is a 75 y.o. year old male who is a primary care patient of [name], MD.  The Care Management team was consulted for assistance with coordination needs.Mental Health Counseling and Resources  Engaged with patient by telephone for initial visit in response to provider referral for social work chronic care management and care coordination services  Consent to Services:  [name] was given information about Care Management services today including:  Care Management services includes personalized support from designated clinical staff supervised by his physician, including individualized plan of care and coordination with other care providers 24/7 contact phone numbers for assistance for urgent and routine care needs. The patient may stop case management services at any time by phone call to the office staff.  Patient agreed to services and consent obtained.    Assessment: .   Patient was accompanied by his wife [name] who provided information during this encounter. Patient has wheelchair that works for him, they purchased; previous wheelchair picked up by company that delivered it; patient currently has [place] and PT in place, will be transitioning to outpatient therapy; has all needed equipment. See Care Plan below for interventions and patient self-care actives.  Recent life changes or stressors: adjusting to new normal and understanding medical condition.  Recommendation: Patient may benefit from, and is in agreement to contact insurance provider to inquire about co-pay before moving forward with connecting for therapy. He will also ask about enhanced benefits to see if they provider transportation etc.   Follow up Plan: Patient would like continued follow-up from CCM LCSW .  per patient's request will follow up in 1 week.  Will call office if  needed prior to next encounter.    : Review of patient past medical history, allergies, medications, and health status, including review of relevant consultants reports was performed today as part of a comprehensive evaluation and provision of chronic care management and care coordination services.  SDOH (Social Determinants of Health) assessments and interventions performed:  SDOH Interventions    Flowsheet Row Most Recent Value  SDOH Interventions   SDOH Interventions for the Following Domains Depression  Depression Interventions/Treatment  --  [connect for counseling and assess needs]        Advanced Directives Status: Not addressed in this encounter.  Care Plan  Allergies  Allergen Reactions   Bactrim [Sulfamethoxazole-Trimethoprim] Anaphylaxis    Ulcers   Ciprofloxacin Swelling    Tongue   Crestor [Rosuvastatin Calcium]     Severe Myalgias    Outpatient Encounter Medications as of 01/30/2021  Medication Sig Note   acetaminophen (TYLENOL) 325 MG tablet Take 2 tablets (650 mg total) by mouth every 6 (six) hours as needed for mild pain or moderate pain.    Alirocumab (PRALUENT) 75 MG/ML SOAJ Inject 75 mg into the skin every 14 (fourteen) days. 01/13/2021: 90 day supply 11/03/2020   amiodarone (PACERONE) 200 MG tablet Take 1 tablet (200 mg total) by mouth daily. 01/13/2021: 90 day supply 11/25/2020   aspirin 81 MG tablet Take 81 mg by mouth daily with supper.    bisoprolol (ZEBETA) 5 MG tablet Take 0.5 tablets (2.5 mg total) by mouth daily.    buPROPion (WELLBUTRIN XL) 150 MG 24 hr tablet Take 1 tablet (150 mg total) by mouth daily.    carbidopa-levodopa (SINEMET IR) 25-100 MG tablet Take 1 tablet by mouth 3 (three) times daily. Times:  6AM, 2PM, & 10PM    diphenhydramine-acetaminophen (TYLENOL PM) 25-500 MG TABS tablet Take 2 tablets by mouth at bedtime as needed (leg pain).    DULoxetine (CYMBALTA) 20 MG capsule Take 2 capsules (40 mg total) by mouth daily. 01/13/2021: 90 day supply  12/24/2020   fluticasone (FLONASE) 50 MCG/ACT nasal spray Place 1 spray into both nostrils at bedtime as needed (stuffy nose). 01/13/2021: 11/16/2020   lisinopril (ZESTRIL) 40 MG tablet Take 1 tablet (40 mg total) by mouth daily with supper. For blood pressure.    Multiple Vitamins-Minerals (MULTIVITAMIN WITH MINERALS) tablet Take 1 tablet by mouth daily with supper. Centrum Silver    nitroGLYCERIN (NITROSTAT) 0.4 MG SL tablet Place 1 tablet (0.4 mg total) every 5 (five) minutes as needed under the tongue for chest pain.    omeprazole (PRILOSEC) 40 MG capsule TAKE 1 CAPSULE (40 MG TOTAL) BY MOUTH 2 (TWO) TIMES DAILY BEFORE A MEAL. FOR HEARTBURN. 01/13/2021: 90 day supply 10/31/2020   ondansetron (ZOFRAN ODT) 4 MG disintegrating tablet Take 1 tablet (4 mg total) by mouth every 8 (eight) hours as needed for nausea or vomiting.    pravastatin (PRAVACHOL) 80 MG tablet Take 1 tablet (80 mg total) by mouth daily. For cholesterol. 01/13/2021: 90 day supply 10/31/2020   psyllium (HYDROCIL/METAMUCIL) 95 % PACK Take 1 packet by mouth daily.    tiZANidine (ZANAFLEX) 4 MG tablet Take 1 tablet (4 mg total) by mouth at bedtime as needed for muscle spasms.    triamcinolone cream (KENALOG) 0.1 % Apply 1 application topically daily as needed (rash around nose).    vitamin B-12 (CYANOCOBALAMIN) 1000 MCG tablet Take 1,000 mcg by mouth daily with supper.    warfarin (COUMADIN) 7.5 MG tablet Take 3.75-7.5 mg by mouth See admin instructions. Take 1 tablet (7.'5mg'$  ) by mouth on Sun and Thurs. All other days take 1/2 tablet (3.'75mg'$ ) at supper time    Facility-Administered Encounter Medications as of 01/30/2021  Medication   sodium chloride flush (NS) 0.9 % injection 3 mL    Patient Active Problem List   Diagnosis Date Noted   Depression, recurrent ([place]) 01/18/2021   Urinary frequency 01/18/2021   Partial small bowel obstruction ([place]) 01/13/2021   Positive depression screening 01/08/2021   Thyroid nodule 12/23/2020    Suspected Movement disorder- possibly PD  12/22/2020   Hematoma- Paraspinal from Fall  12/22/2020   Frequent falls 11/13/2020   Orthostatic dizziness 08/28/2020   Chronic pain of both lower extremities 07/17/2020   COPD exacerbation ([place]) 05/08/2020   Statin myopathy 06/07/2019   Left-sided weakness 08/12/2018   Chronic obstructive pulmonary disease ([place]) 06/13/2018   Sleep apnea 09/16/2017   Testicular atrophy 04/23/2017   GERD (gastroesophageal reflux disease) 02/08/2015   IBS (irritable bowel syndrome) 02/08/2015   Essential hypertension 02/08/2015   AF (paroxysmal atrial fibrillation) ([place]) 01/23/2015   CAD (coronary artery disease) 01/23/2015   History of CVA (cerebrovascular accident) 01/23/2015    Conditions to be addressed/monitored: Depression;   Care Plan : General Social Work (Adult)  Updates made by Maurine Cane, LCSW since 01/30/2021 12:00 AM     Problem: Coping Skills      Goal: Coping Skills Enhanced by connecting with counseling   Start Date: 01/30/2021  This Visit's Progress: On track  Priority: High  Note:   Current barriers:   Chronic Mental Health needs related to symptoms of depression  Needs Support, Education, and Care Coordination in order to meet unmet mental health needs. Clinical Goal(s):  patient will follow up with insurance provider ref. Co-pay for therapy* as directed by SW  Clinical Interventions:  Inter-disciplinary care team collaboration (see longitudinal plan of care) Assessed patient's previous and current treatment, coping skills, support system and barriers to care  Review various resources, discussed options and provided patient information about Enhanced Benefits connected with insurance provider  and Options for mental health treatment based on need and insurance Would like face to face therapy but unsure of co-pay Solution-Focused Strategies, Active listening / Reflection utilized , Emotional Supportive Provided, and Problem  [place]  ; Patient Goals/Self-Care Activities: Over the next 14 days Call your insurance provider to inquire about co-pay for therapy Call your insurance provider for more information about your Enhanced Benefits  Continue with compliance of taking medication       Casimer Lanius, [place] / [place]   [phone] 4:03 PM

## 2021-01-30 NOTE — Telephone Encounter (Signed)
Discussed with patient and wife, switching to Bupropion '150mg'$  once daily. Prescription has been sent in and they will call back if the co-pay is too expensive.    Willisha Sligar, DO

## 2021-01-30 NOTE — Telephone Encounter (Signed)
Called patient and wife. They currently do not need any refills of the Tizanidine. Also discussed changing antidepressant from duloxetine to Bupropion - had a discussion with Dr. Valentina Lucks given patient's history of adverse effects with Celexa and issues with getting coverage with Duloxetine. Discussed that there is no need for taper from the duloxetine '40mg'$  and he can start taking the Bupropion the day after his last dose. They were okay with switching the medications and will need an appointment in the next 2-4 weeks to see how the patient is tolerating the change in medications.   William Donaway, DO

## 2021-01-31 ENCOUNTER — Telehealth: Payer: Self-pay

## 2021-01-31 DIAGNOSIS — M47814 Spondylosis without myelopathy or radiculopathy, thoracic region: Secondary | ICD-10-CM | POA: Diagnosis not present

## 2021-01-31 DIAGNOSIS — R269 Unspecified abnormalities of gait and mobility: Secondary | ICD-10-CM | POA: Diagnosis not present

## 2021-01-31 DIAGNOSIS — G1220 Motor neuron disease, unspecified: Secondary | ICD-10-CM | POA: Diagnosis not present

## 2021-01-31 DIAGNOSIS — W19XXXD Unspecified fall, subsequent encounter: Secondary | ICD-10-CM | POA: Diagnosis not present

## 2021-01-31 DIAGNOSIS — M47816 Spondylosis without myelopathy or radiculopathy, lumbar region: Secondary | ICD-10-CM | POA: Diagnosis not present

## 2021-01-31 DIAGNOSIS — M5126 Other intervertebral disc displacement, lumbar region: Secondary | ICD-10-CM | POA: Diagnosis not present

## 2021-01-31 NOTE — Telephone Encounter (Signed)
Mitzi Hansen [facility] PT calls nurse line requesting verbal orders for [facility] PT as follows.   1x a week for 4 weeks   Verbal orders given per [facility] protocol.

## 2021-02-04 ENCOUNTER — Telehealth: Payer: Self-pay | Admitting: *Deleted

## 2021-02-04 DIAGNOSIS — Z955 Presence of coronary angioplasty implant and graft: Secondary | ICD-10-CM | POA: Diagnosis not present

## 2021-02-04 DIAGNOSIS — I10 Essential (primary) hypertension: Secondary | ICD-10-CM | POA: Diagnosis not present

## 2021-02-04 DIAGNOSIS — K219 Gastro-esophageal reflux disease without esophagitis: Secondary | ICD-10-CM | POA: Diagnosis not present

## 2021-02-04 DIAGNOSIS — W19XXXD Unspecified fall, subsequent encounter: Secondary | ICD-10-CM | POA: Diagnosis not present

## 2021-02-04 DIAGNOSIS — I69354 Hemiplegia and hemiparesis following cerebral infarction affecting left non-dominant side: Secondary | ICD-10-CM | POA: Diagnosis not present

## 2021-02-04 DIAGNOSIS — I4891 Unspecified atrial fibrillation: Secondary | ICD-10-CM | POA: Diagnosis not present

## 2021-02-04 DIAGNOSIS — I252 Old myocardial infarction: Secondary | ICD-10-CM | POA: Diagnosis not present

## 2021-02-04 DIAGNOSIS — G259 Extrapyramidal and movement disorder, unspecified: Secondary | ICD-10-CM

## 2021-02-04 NOTE — Telephone Encounter (Signed)
Patient called and he would like to be referred locally now for neurology as the drive to [facility] is becoming too much.  Will forward to MD and ask her to place this referral.  Patient has tried [facility] in the past and wants to see another office.  Kennia Vanvorst,CMA

## 2021-02-05 ENCOUNTER — Other Ambulatory Visit: Payer: Self-pay

## 2021-02-05 ENCOUNTER — Telehealth: Payer: Self-pay | Admitting: Family Medicine

## 2021-02-05 ENCOUNTER — Ambulatory Visit: Payer: Medicare HMO | Admitting: *Deleted

## 2021-02-05 ENCOUNTER — Ambulatory Visit: Payer: Medicare HMO | Admitting: Licensed Clinical Social Worker

## 2021-02-05 DIAGNOSIS — I480 Paroxysmal atrial fibrillation: Secondary | ICD-10-CM | POA: Diagnosis not present

## 2021-02-05 DIAGNOSIS — R002 Palpitations: Secondary | ICD-10-CM

## 2021-02-05 DIAGNOSIS — Z7901 Long term (current) use of anticoagulants: Secondary | ICD-10-CM

## 2021-02-05 DIAGNOSIS — Z7189 Other specified counseling: Secondary | ICD-10-CM

## 2021-02-05 DIAGNOSIS — F339 Major depressive disorder, recurrent, unspecified: Secondary | ICD-10-CM

## 2021-02-05 LAB — POCT INR: INR: 2.7 (ref 2.0–3.0)

## 2021-02-05 MED ORDER — BUPROPION HCL ER (SR) 150 MG PO TB12: ORAL_TABLET | ORAL | 0 refills | Status: DC

## 2021-02-05 NOTE — Patient Instructions (Signed)
Description   Continue taking 1/2 tablet daily except for 1 tablet on Sundays and Thursdays.  Recheck INR in 3 weeks. Coumadin Clinic [phone].

## 2021-02-05 NOTE — Telephone Encounter (Signed)
Called patient to discuss his depression medication. Bupropion XL was too expensive, so he did not pick it up. We will try Bupropion SR instead, as this tends to be more affordable. Twice daily dosing is not an issue as he takes his Sinemet at 6am and 2pm anyway.   Counseled on side effects including insomnia and increased anxiety. Advised him to take the second dose 8 hours after the first, but not to take the second dose late in the afternoon or evening. We will start with '150mg'$  once daily x1 week and then increase to twice daily if tolerating well. He will stop his Cymbalta 1 day prior. He or his wife will call if he experiences any adverse reactions.

## 2021-02-05 NOTE — Chronic Care Management (AMB) (Signed)
**Note [name]** Care Management Clinical Social Work Note  02/05/2021 Name: [name] MRN: [id number] DOB: 01-04-1946  [name] is a 75 y.o. year old male who is a primary care patient of [name], MD.  The Care Management team was consulted for assistance with chronic disease management and coordination needs.Mental Health Counseling and Resources  Consent to Services:  The patient was given information about Care Management services, agreed to services, and gave verbal consent prior to initiation of services.  Please see initial visit note for detailed documentation.   Patient agreed to services today and consent obtained.   Assessment: Engaged with patient by phone in response to provider referral for social work care coordination services: .   Patient was accompanied by his wife who provided information during this encounter. Patient has decided not to move forward with counseling due to $45.00 co-pay. He will take new medication and evaluate in 30 days . See Care Plan below for interventions and patient self-care actives.  Recommendation: Patient may benefit from, and is in agreement review Advance Directives education sent via EMMI.   Follow up Plan: Patient would like continued follow-up from CCM LCSW .  per patient's request will follow up in 30 days.  Will call office if needed prior to next encounter.   Review of patient past medical history, allergies, medications, and health status, including review of relevant consultants reports was performed today as part of a comprehensive evaluation and provision of chronic care management and care coordination services.  SDOH (Social Determinants of Health) assessments and interventions performed:    Advanced Directives Status: See Care Plan for related entries.  Care Plan  Allergies  Allergen Reactions   Bactrim [Sulfamethoxazole-Trimethoprim] Anaphylaxis    Ulcers   Ciprofloxacin Swelling    Tongue   Crestor [Rosuvastatin  Calcium]     Severe Myalgias    Outpatient Encounter Medications as of 02/05/2021  Medication Sig Note   acetaminophen (TYLENOL) 325 MG tablet Take 2 tablets (650 mg total) by mouth every 6 (six) hours as needed for mild pain or moderate pain.    Alirocumab (PRALUENT) 75 MG/ML SOAJ Inject 75 mg into the skin every 14 (fourteen) days. 01/13/2021: 90 day supply 11/03/2020   amiodarone (PACERONE) 200 MG tablet Take 1 tablet (200 mg total) by mouth daily. 01/13/2021: 90 day supply 11/25/2020   aspirin 81 MG tablet Take 81 mg by mouth daily with supper.    bisoprolol (ZEBETA) 5 MG tablet Take 0.5 tablets (2.5 mg total) by mouth daily.    buPROPion (WELLBUTRIN SR) 150 MG 12 hr tablet Take one tablet ('150mg'$ ) daily at 6am for 7 days. Then take 1 tablet ('150mg'$ ) twice daily (6am and 2pm).    carbidopa-levodopa (SINEMET IR) 25-100 MG tablet Take 1 tablet by mouth 3 (three) times daily. Times: 6AM, 2PM, & 10PM    diphenhydramine-acetaminophen (TYLENOL PM) 25-500 MG TABS tablet Take 2 tablets by mouth at bedtime as needed (leg pain).    DULoxetine (CYMBALTA) 20 MG capsule Take 2 capsules (40 mg total) by mouth daily. 01/13/2021: 90 day supply 12/24/2020   fluticasone (FLONASE) 50 MCG/ACT nasal spray Place 1 spray into both nostrils at bedtime as needed (stuffy nose). 01/13/2021: 11/16/2020   lisinopril (ZESTRIL) 40 MG tablet Take 1 tablet (40 mg total) by mouth daily with supper. For blood pressure.    Multiple Vitamins-Minerals (MULTIVITAMIN WITH MINERALS) tablet Take 1 tablet by mouth daily with supper. Centrum Silver    nitroGLYCERIN (NITROSTAT) 0.4 MG SL tablet Place  1 tablet (0.4 mg total) every 5 (five) minutes as needed under the tongue for chest pain.    omeprazole (PRILOSEC) 40 MG capsule TAKE 1 CAPSULE (40 MG TOTAL) BY MOUTH 2 (TWO) TIMES DAILY BEFORE A MEAL. FOR HEARTBURN. 01/13/2021: 90 day supply 10/31/2020   ondansetron (ZOFRAN ODT) 4 MG disintegrating tablet Take 1 tablet (4 mg total) by mouth every 8  (eight) hours as needed for nausea or vomiting.    pravastatin (PRAVACHOL) 80 MG tablet Take 1 tablet (80 mg total) by mouth daily. For cholesterol. 01/13/2021: 90 day supply 10/31/2020   psyllium (HYDROCIL/METAMUCIL) 95 % PACK Take 1 packet by mouth daily.    tiZANidine (ZANAFLEX) 4 MG tablet Take 1 tablet (4 mg total) by mouth at bedtime as needed for muscle spasms.    triamcinolone cream (KENALOG) 0.1 % Apply 1 application topically daily as needed (rash around nose).    vitamin B-12 (CYANOCOBALAMIN) 1000 MCG tablet Take 1,000 mcg by mouth daily with supper.    warfarin (COUMADIN) 7.5 MG tablet Take 3.75-7.5 mg by mouth See admin instructions. Take 1 tablet (7.'5mg'$  ) by mouth on Sun and Thurs. All other days take 1/2 tablet (3.'75mg'$ ) at supper time    Facility-Administered Encounter Medications as of 02/05/2021  Medication   sodium chloride flush (NS) 0.9 % injection 3 mL    Patient Active Problem List   Diagnosis Date Noted   Depression, recurrent ([place]) 01/18/2021   Urinary frequency 01/18/2021   Partial small bowel obstruction ([place]) 01/13/2021   Positive depression screening 01/08/2021   Thyroid nodule 12/23/2020   Suspected Movement disorder- possibly PD  12/22/2020   Hematoma- Paraspinal from Fall  12/22/2020   Frequent falls 11/13/2020   Orthostatic dizziness 08/28/2020   Chronic pain of both lower extremities 07/17/2020   COPD exacerbation ([place]) 05/08/2020   Statin myopathy 06/07/2019   Left-sided weakness 08/12/2018   Chronic obstructive pulmonary disease ([place]) 06/13/2018   Sleep apnea 09/16/2017   Testicular atrophy 04/23/2017   GERD (gastroesophageal reflux disease) 02/08/2015   IBS (irritable bowel syndrome) 02/08/2015   Essential hypertension 02/08/2015   AF (paroxysmal atrial fibrillation) ([place]) 01/23/2015   CAD (coronary artery disease) 01/23/2015   History of CVA (cerebrovascular accident) 01/23/2015    Conditions to be addressed/monitored: Depression; Mental Health  Concerns   Care Plan : General Social Work (Adult)  Updates made by Maurine Cane, LCSW since 02/05/2021 12:00 AM     Problem: Coping Skills      Goal: Coping Skills Enhanced by connecting with counseling   Start Date: 01/30/2021  This Visit's Progress: Not on track  Recent Progress: On track  Priority: High  Note:   Current barriers:   Unable to afford co-pay for counseling  Chronic Mental Health needs related to symptoms of depression  Needs Support, Education, and Care Coordination in order to meet unmet mental health needs. Clinical Goal(s): patient will follow up with insurance provider ref. Co-pay for therapy* as directed by SW  Clinical Interventions:  Inter-disciplinary care team collaboration (see longitudinal plan of care) Assessed patient's current treatment, coping skills, support system and barriers to care  Review various resources, discussed options and provided patient information about Enhanced Benefits connected with insurance provider  and Options for mental health treatment based on need and insurance Patient does not have enhanced benefits with insurance provider Co-pay for counseling is $45.00 per session  Discussed [place] program for information about various Medicare Health plan ( patient declined he has someone that  helps with this) Solution-Focused Strategies, Active listening / Reflection utilized , Emotional Supportive Provided, and Problem [place]  ; Patient Goals/Self-Care Activities: Over the next 14 days Continue with compliance of taking medication      Problem: No Advance Directive      Goal: Complete Advance Directive   Start Date: 02/05/2021  This Visit's Progress: On track  Priority: Medium  Note:   Current barriers:   Patient does not have an advance directive.  Needs education, support and coordination in order to meet this need. Clinical Goal(s): Over the next 30 days, the patient will review information on advance  directive, complete advance directive packet and have notarized.  Interventions: Inter-disciplinary care team collaboration (see longitudinal plan of care) Assessed understanding of Advance Directives. A voluntary discussion about advanced care planning including importance of advanced directives, healthcare proxy and living will was discussed with the patient and wife  Patient declined to mail packet: will pick up Advance Directives packet at office Patient Goals/Self-Care Activities : Over the next 30 days Review educational information on Advance Directive  Complete Advance Directive packet,  Have advance directive notarized and provide a copy to provider office Call LCSW if you have questions       Casimer Lanius, [place] / [place]   [phone] 12:13 PM

## 2021-02-05 NOTE — Patient Instructions (Signed)
Visit Information   Goals Addressed             This Visit's Progress    Effective Long-Term Care Planning with Advance Directive   On track    Patient Goals/Self-Care Activities : Over the next 30 days Review educational information on Advance Directive  Complete Advance Directive packet,  Have advance directive notarized and provide a copy to provider office Call LCSW if you have questions      Manager symptoms of Depression   On track    Timeframe:  Short-Term Goal Priority:  High Start Date:   01/30/21                          Expected End Date:                         Patient Goals/Self-Care Activities: Over the next 30 days  Continue with compliance of taking medication   Why is this important?   Beating depression may take some time.         It was a pleasure speaking with you today. Please call the office if needed No follow up scheduled, per our conversation I will contact you in 30 days per your request.  Patient verbalizes understanding of instructions provided today.   Casimer Lanius, LCSW Care Management & Coordination  [phone]

## 2021-02-06 ENCOUNTER — Telehealth: Payer: Self-pay

## 2021-02-06 ENCOUNTER — Other Ambulatory Visit: Payer: Self-pay | Admitting: Family Medicine

## 2021-02-06 DIAGNOSIS — K219 Gastro-esophageal reflux disease without esophagitis: Secondary | ICD-10-CM | POA: Diagnosis not present

## 2021-02-06 DIAGNOSIS — Z955 Presence of coronary angioplasty implant and graft: Secondary | ICD-10-CM | POA: Diagnosis not present

## 2021-02-06 DIAGNOSIS — F339 Major depressive disorder, recurrent, unspecified: Secondary | ICD-10-CM

## 2021-02-06 DIAGNOSIS — I10 Essential (primary) hypertension: Secondary | ICD-10-CM | POA: Diagnosis not present

## 2021-02-06 DIAGNOSIS — W19XXXD Unspecified fall, subsequent encounter: Secondary | ICD-10-CM | POA: Diagnosis not present

## 2021-02-06 DIAGNOSIS — I252 Old myocardial infarction: Secondary | ICD-10-CM | POA: Diagnosis not present

## 2021-02-06 DIAGNOSIS — I4891 Unspecified atrial fibrillation: Secondary | ICD-10-CM | POA: Diagnosis not present

## 2021-02-06 DIAGNOSIS — I69354 Hemiplegia and hemiparesis following cerebral infarction affecting left non-dominant side: Secondary | ICD-10-CM | POA: Diagnosis not present

## 2021-02-06 NOTE — Telephone Encounter (Signed)
Patient returns call to nurse line. States that new medication is also too expensive.   Please advise.   Talbot Grumbling, RN

## 2021-02-06 NOTE — Telephone Encounter (Signed)
Mitzi Hansen, PT calling for PT verbal orders as follows:  1 time(s) weekly for 4 week(s).   Verbal orders given per [facility] protocol  Talbot Grumbling, RN

## 2021-02-13 DIAGNOSIS — K219 Gastro-esophageal reflux disease without esophagitis: Secondary | ICD-10-CM | POA: Diagnosis not present

## 2021-02-13 DIAGNOSIS — I252 Old myocardial infarction: Secondary | ICD-10-CM | POA: Diagnosis not present

## 2021-02-13 DIAGNOSIS — I10 Essential (primary) hypertension: Secondary | ICD-10-CM | POA: Diagnosis not present

## 2021-02-13 DIAGNOSIS — W19XXXD Unspecified fall, subsequent encounter: Secondary | ICD-10-CM | POA: Diagnosis not present

## 2021-02-13 DIAGNOSIS — I69354 Hemiplegia and hemiparesis following cerebral infarction affecting left non-dominant side: Secondary | ICD-10-CM | POA: Diagnosis not present

## 2021-02-13 DIAGNOSIS — I4891 Unspecified atrial fibrillation: Secondary | ICD-10-CM | POA: Diagnosis not present

## 2021-02-13 DIAGNOSIS — Z955 Presence of coronary angioplasty implant and graft: Secondary | ICD-10-CM | POA: Diagnosis not present

## 2021-02-18 DIAGNOSIS — I252 Old myocardial infarction: Secondary | ICD-10-CM | POA: Diagnosis not present

## 2021-02-18 DIAGNOSIS — W19XXXD Unspecified fall, subsequent encounter: Secondary | ICD-10-CM | POA: Diagnosis not present

## 2021-02-18 DIAGNOSIS — Z955 Presence of coronary angioplasty implant and graft: Secondary | ICD-10-CM | POA: Diagnosis not present

## 2021-02-18 DIAGNOSIS — I10 Essential (primary) hypertension: Secondary | ICD-10-CM | POA: Diagnosis not present

## 2021-02-18 DIAGNOSIS — I4891 Unspecified atrial fibrillation: Secondary | ICD-10-CM | POA: Diagnosis not present

## 2021-02-18 DIAGNOSIS — I69354 Hemiplegia and hemiparesis following cerebral infarction affecting left non-dominant side: Secondary | ICD-10-CM | POA: Diagnosis not present

## 2021-02-18 DIAGNOSIS — K219 Gastro-esophageal reflux disease without esophagitis: Secondary | ICD-10-CM | POA: Diagnosis not present

## 2021-02-20 ENCOUNTER — Telehealth: Payer: Self-pay

## 2021-02-20 DIAGNOSIS — I252 Old myocardial infarction: Secondary | ICD-10-CM | POA: Diagnosis not present

## 2021-02-20 DIAGNOSIS — I69354 Hemiplegia and hemiparesis following cerebral infarction affecting left non-dominant side: Secondary | ICD-10-CM | POA: Diagnosis not present

## 2021-02-20 DIAGNOSIS — K219 Gastro-esophageal reflux disease without esophagitis: Secondary | ICD-10-CM | POA: Diagnosis not present

## 2021-02-20 DIAGNOSIS — I10 Essential (primary) hypertension: Secondary | ICD-10-CM | POA: Diagnosis not present

## 2021-02-20 DIAGNOSIS — Z955 Presence of coronary angioplasty implant and graft: Secondary | ICD-10-CM | POA: Diagnosis not present

## 2021-02-20 DIAGNOSIS — W19XXXD Unspecified fall, subsequent encounter: Secondary | ICD-10-CM | POA: Diagnosis not present

## 2021-02-20 DIAGNOSIS — I4891 Unspecified atrial fibrillation: Secondary | ICD-10-CM | POA: Diagnosis not present

## 2021-02-20 NOTE — Telephone Encounter (Signed)
Patient returns call to nurse line to check status of receiving new medication for depression that is more affordable.   Please advise.   Talbot Grumbling, RN

## 2021-02-20 NOTE — Telephone Encounter (Signed)
Spoke with patient and his wife via telephone. They have a list of anti-depressants that are covered by his insurance. They will bring this list to his upcoming appointment and we will address at that time. They are agreeable with this plan and were appreciative of the call.

## 2021-02-20 NOTE — Telephone Encounter (Signed)
Patient calls nurse line requesting referral to Neurologist at [facility] with Dr. Delice Lesch.   Talbot Grumbling, RN

## 2021-02-20 NOTE — Telephone Encounter (Signed)
Spoke with patient and his wife via telephone. They had poor experiences with [place] groups in the past so were initially hesitant to see [facility] neurology. Offered referral to [facility] instead, who reportedly has an excellent movement disorder group. However, [place] is too far and they would prefer to try locally first. They are amenable to seeing [place] [facility]. Would need to see someone who specializes in Parkinson's/movement disorders specifically. Referral placed.

## 2021-02-20 NOTE — Telephone Encounter (Signed)
Referral placed to [facility] neurology.

## 2021-02-25 ENCOUNTER — Encounter: Payer: Self-pay | Admitting: Neurology

## 2021-02-25 DIAGNOSIS — I69354 Hemiplegia and hemiparesis following cerebral infarction affecting left non-dominant side: Secondary | ICD-10-CM | POA: Diagnosis not present

## 2021-02-25 DIAGNOSIS — W19XXXD Unspecified fall, subsequent encounter: Secondary | ICD-10-CM | POA: Diagnosis not present

## 2021-02-25 DIAGNOSIS — K219 Gastro-esophageal reflux disease without esophagitis: Secondary | ICD-10-CM | POA: Diagnosis not present

## 2021-02-25 DIAGNOSIS — I252 Old myocardial infarction: Secondary | ICD-10-CM | POA: Diagnosis not present

## 2021-02-25 DIAGNOSIS — I10 Essential (primary) hypertension: Secondary | ICD-10-CM | POA: Diagnosis not present

## 2021-02-25 DIAGNOSIS — I4891 Unspecified atrial fibrillation: Secondary | ICD-10-CM | POA: Diagnosis not present

## 2021-02-25 DIAGNOSIS — Z955 Presence of coronary angioplasty implant and graft: Secondary | ICD-10-CM | POA: Diagnosis not present

## 2021-02-26 ENCOUNTER — Ambulatory Visit: Payer: Medicare HMO | Admitting: *Deleted

## 2021-02-26 ENCOUNTER — Other Ambulatory Visit: Payer: Self-pay

## 2021-02-26 DIAGNOSIS — Z7901 Long term (current) use of anticoagulants: Secondary | ICD-10-CM

## 2021-02-26 DIAGNOSIS — Z5181 Encounter for therapeutic drug level monitoring: Secondary | ICD-10-CM

## 2021-02-26 DIAGNOSIS — R002 Palpitations: Secondary | ICD-10-CM

## 2021-02-26 LAB — POCT INR: INR: 2.7 (ref 2.0–3.0)

## 2021-02-26 NOTE — Patient Instructions (Signed)
Description   Continue taking 1/2 tablet daily except for 1 tablet on Sundays and Thursdays.  Recheck INR in 4 weeks. Coumadin Clinic [phone].

## 2021-02-27 ENCOUNTER — Other Ambulatory Visit: Payer: Self-pay | Admitting: Gastroenterology

## 2021-02-27 DIAGNOSIS — K219 Gastro-esophageal reflux disease without esophagitis: Secondary | ICD-10-CM

## 2021-03-04 DIAGNOSIS — Z955 Presence of coronary angioplasty implant and graft: Secondary | ICD-10-CM | POA: Diagnosis not present

## 2021-03-04 DIAGNOSIS — I10 Essential (primary) hypertension: Secondary | ICD-10-CM | POA: Diagnosis not present

## 2021-03-04 DIAGNOSIS — I69354 Hemiplegia and hemiparesis following cerebral infarction affecting left non-dominant side: Secondary | ICD-10-CM | POA: Diagnosis not present

## 2021-03-04 DIAGNOSIS — W19XXXD Unspecified fall, subsequent encounter: Secondary | ICD-10-CM | POA: Diagnosis not present

## 2021-03-04 DIAGNOSIS — I4891 Unspecified atrial fibrillation: Secondary | ICD-10-CM | POA: Diagnosis not present

## 2021-03-04 DIAGNOSIS — K219 Gastro-esophageal reflux disease without esophagitis: Secondary | ICD-10-CM | POA: Diagnosis not present

## 2021-03-04 DIAGNOSIS — I252 Old myocardial infarction: Secondary | ICD-10-CM | POA: Diagnosis not present

## 2021-03-05 ENCOUNTER — Other Ambulatory Visit: Payer: Self-pay | Admitting: Family Medicine

## 2021-03-06 DIAGNOSIS — K219 Gastro-esophageal reflux disease without esophagitis: Secondary | ICD-10-CM | POA: Diagnosis not present

## 2021-03-06 DIAGNOSIS — I10 Essential (primary) hypertension: Secondary | ICD-10-CM | POA: Diagnosis not present

## 2021-03-06 DIAGNOSIS — I252 Old myocardial infarction: Secondary | ICD-10-CM | POA: Diagnosis not present

## 2021-03-06 DIAGNOSIS — I4891 Unspecified atrial fibrillation: Secondary | ICD-10-CM | POA: Diagnosis not present

## 2021-03-06 DIAGNOSIS — Z955 Presence of coronary angioplasty implant and graft: Secondary | ICD-10-CM | POA: Diagnosis not present

## 2021-03-06 DIAGNOSIS — I69354 Hemiplegia and hemiparesis following cerebral infarction affecting left non-dominant side: Secondary | ICD-10-CM | POA: Diagnosis not present

## 2021-03-06 DIAGNOSIS — W19XXXD Unspecified fall, subsequent encounter: Secondary | ICD-10-CM | POA: Diagnosis not present

## 2021-03-06 NOTE — Progress Notes (Addendum)
    SUBJECTIVE:   CHIEF COMPLAINT / HPI:   Depression follow up Patient with hx of mild depression for most of his life. 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 Currently on Cymbalta 40mg  daily.  Endorses depressed mood with passive SI more than half the days. States he feels like a burden to his wife. "Sometimes I look at what I'm putting her through and think it would all be easier if I weren't here." Denies plan or intent.   Attempted to prescribe bupropion at his last visit, but it was too expensive. He and his wife are not interested in therapy because additional appointments would be burdensome, they've had trouble finding someone they like, and it's expensive. Patient provides a list from his insurance of which anti-depressant medications are affordable (Citalopram, Fluoxetine, Sertraline, and Trazodone are Level 1 preferred. Mirtazapine is level 2).   Parkinson-Like Movement Disorder Patient inquiring about increasing his Carvidopa-Levodopa dose. Currently on 25-100mg  TID. Noticed benefit when he initially started. Over past 1 month has found it less helpful. He and his wife notice a decline in his function/movement. They are also wondering about Tizanidine- he takes 4mg  daily at bedtime and finds this helpful with his leg pain.  Patient has an appointment with neurology on 10/13 (initial visit).   Constipation Patient complaining of constipation. Taking Miralax without much improvement.   PERTINENT  PMH / PSH: PD-like movement disorder, retroperitoneal hematoma, HTN, HLD, CAD, paroxysmal a-fib, prior CVA  OBJECTIVE:   BP (!) 160/70   Pulse 63   Ht 6' (1.829 m)   Wt 177 lb 12.8 oz (80.6 kg)   SpO2 97%   BMI 24.11 kg/m   General: NAD, pleasant, able to participate in exam Respiratory: No respiratory distress Skin: warm and dry, no rashes  noted Psych: Somewhat flat affect Neuro: No obvious focal deficits. 5/5 strength in all extremities. +masked facies. No tremor or cogwheel rigidity noted. Did not assess gait.   ASSESSMENT/PLAN:   Suspected Movement disorder- possibly PD  Per patient and his wife, his function continues to decline.  Noticed a benefit with Sinemet initially, but now has regressed somewhat -Increase Sinemet dose gradually, up to 50-200 TID (will increase by one tablet every 5 days as needed) -Neuro follow up on 10/13 -Continue home health PT/OT -Continue Tizanidine 4mg  nightly. Would like to avoid multiple medication changes simultaneously.  Depression, recurrent ([place]) Patient with significant depression related to his declining health and loss of independence. PHQ-9 score 22 today with a positive response to question #9. He endorses passive SI nearly daily (feels burdensome to his wife). No plan or intent. -Continue Cymbalta 40mg  daily for now -Consider trial of Mirtazapine (in addition to Cymbalta) at next visit (increasing Sinemet today, want to avoid multiple medication changes at once in this patient).  -Patient would prefer to switch off Cymbalta at some point if possible due to cost. Will discuss with pharmacy. -Regardless, no medication changes today. Will wait until next appt after he sees neurology. -Unfortunately do not think medication will resolve his depression, as it's largely related to his progressive illness -Patient declines counseling -May need to discuss palliative consult in the future   Constipation Added Senna daily in addition to Miralax. Given refills on Zofran per his request (often has associated nausea).   [name], MD [place]

## 2021-03-07 ENCOUNTER — Ambulatory Visit (INDEPENDENT_AMBULATORY_CARE_PROVIDER_SITE_OTHER): Payer: Medicare HMO | Admitting: Family Medicine

## 2021-03-07 ENCOUNTER — Other Ambulatory Visit: Payer: Self-pay

## 2021-03-07 VITALS — BP 160/70 | HR 63 | Ht 72.0 in | Wt 177.8 lb

## 2021-03-07 DIAGNOSIS — F339 Major depressive disorder, recurrent, unspecified: Secondary | ICD-10-CM | POA: Diagnosis not present

## 2021-03-07 DIAGNOSIS — R69 Illness, unspecified: Secondary | ICD-10-CM | POA: Diagnosis not present

## 2021-03-07 DIAGNOSIS — R110 Nausea: Secondary | ICD-10-CM | POA: Diagnosis not present

## 2021-03-07 DIAGNOSIS — R351 Nocturia: Secondary | ICD-10-CM | POA: Diagnosis not present

## 2021-03-07 DIAGNOSIS — G259 Extrapyramidal and movement disorder, unspecified: Secondary | ICD-10-CM

## 2021-03-07 DIAGNOSIS — R350 Frequency of micturition: Secondary | ICD-10-CM | POA: Diagnosis not present

## 2021-03-07 DIAGNOSIS — N3941 Urge incontinence: Secondary | ICD-10-CM | POA: Diagnosis not present

## 2021-03-07 MED ORDER — ONDANSETRON 4 MG PO TBDP: 4.0000 mg | ORAL_TABLET | Freq: Three times a day (TID) | ORAL | 0 refills | Status: DC | PRN

## 2021-03-07 MED ORDER — SENNA 8.6 MG PO TABS: 1.0000 | ORAL_TABLET | Freq: Every day | ORAL | 0 refills | Status: DC

## 2021-03-07 NOTE — Patient Instructions (Addendum)
It was great to see you!  -We are going to increase the dose of your Sinemet. Take 2 tablets at 6am and 1 tablet at 2pm and 8pm for 5 days. Then take 2 tablets at 6am, 2 tablets at 2pm, and 1 tablet at 8pm for 5 days. Can increase to 2 tablets three times daily.  -Continue taking Tizanidine 4mg  (1 tablet) daily at bedtime -I will call you tomorrow to discuss your depression medication and what is covered by your insurance  Take care and seek immediate care sooner if you develop any concerns.  Dr. [facility]

## 2021-03-09 NOTE — Assessment & Plan Note (Signed)
Per patient and his wife, his function continues to decline.  Noticed a benefit with Sinemet initially, but now has regressed somewhat -Increase Sinemet dose gradually, up to 50-200 TID (will increase by one tablet every 5 days as needed) -Neuro follow up on 10/13 -Continue home health PT/OT -Continue Tizanidine 4mg  nightly. Would like to avoid multiple medication changes simultaneously.

## 2021-03-09 NOTE — Assessment & Plan Note (Signed)
Patient with significant depression related to his declining health and loss of independence. PHQ-9 score 22 today with a positive response to question #9. He endorses passive SI nearly daily (feels burdensome to his wife). No plan or intent. -Continue Cymbalta 40mg  daily for now -Consider trial of Mirtazapine (in addition to Cymbalta) at next visit (increasing Sinemet today, want to avoid multiple medication changes at once in this patient).  -Patient would prefer to switch off Cymbalta at some point if possible due to cost. Will discuss with pharmacy. -Regardless, no medication changes today. Will wait until next appt after he sees neurology. -Unfortunately do not think medication will resolve his depression, as it's largely related to his progressive illness -Patient declines counseling -May need to discuss palliative consult in the future

## 2021-03-11 DIAGNOSIS — W19XXXD Unspecified fall, subsequent encounter: Secondary | ICD-10-CM | POA: Diagnosis not present

## 2021-03-11 DIAGNOSIS — I69354 Hemiplegia and hemiparesis following cerebral infarction affecting left non-dominant side: Secondary | ICD-10-CM | POA: Diagnosis not present

## 2021-03-11 DIAGNOSIS — I10 Essential (primary) hypertension: Secondary | ICD-10-CM | POA: Diagnosis not present

## 2021-03-11 DIAGNOSIS — K219 Gastro-esophageal reflux disease without esophagitis: Secondary | ICD-10-CM | POA: Diagnosis not present

## 2021-03-11 DIAGNOSIS — Z955 Presence of coronary angioplasty implant and graft: Secondary | ICD-10-CM | POA: Diagnosis not present

## 2021-03-11 DIAGNOSIS — I252 Old myocardial infarction: Secondary | ICD-10-CM | POA: Diagnosis not present

## 2021-03-11 DIAGNOSIS — I4891 Unspecified atrial fibrillation: Secondary | ICD-10-CM | POA: Diagnosis not present

## 2021-03-12 DIAGNOSIS — K219 Gastro-esophageal reflux disease without esophagitis: Secondary | ICD-10-CM | POA: Diagnosis not present

## 2021-03-12 DIAGNOSIS — I4891 Unspecified atrial fibrillation: Secondary | ICD-10-CM | POA: Diagnosis not present

## 2021-03-12 DIAGNOSIS — W19XXXD Unspecified fall, subsequent encounter: Secondary | ICD-10-CM | POA: Diagnosis not present

## 2021-03-12 DIAGNOSIS — I10 Essential (primary) hypertension: Secondary | ICD-10-CM | POA: Diagnosis not present

## 2021-03-12 DIAGNOSIS — I252 Old myocardial infarction: Secondary | ICD-10-CM | POA: Diagnosis not present

## 2021-03-12 DIAGNOSIS — I69354 Hemiplegia and hemiparesis following cerebral infarction affecting left non-dominant side: Secondary | ICD-10-CM | POA: Diagnosis not present

## 2021-03-12 DIAGNOSIS — Z955 Presence of coronary angioplasty implant and graft: Secondary | ICD-10-CM | POA: Diagnosis not present

## 2021-03-17 ENCOUNTER — Ambulatory Visit: Payer: Medicare HMO | Admitting: Licensed Clinical Social Worker

## 2021-03-17 DIAGNOSIS — Z789 Other specified health status: Secondary | ICD-10-CM

## 2021-03-17 DIAGNOSIS — Z7189 Other specified counseling: Secondary | ICD-10-CM

## 2021-03-17 NOTE — Patient Instructions (Signed)
Visit Information   Goals Addressed             This Visit's Progress    Effective Long-Term Care Planning with Advance Directive   Not on track    Patient Goals/Self-Care Activities :  Per your request a packet will be mailed to your home Review educational information on Advance Directive  Complete Advance Directive packet,  Have advance directive notarized and provide a copy to provider office      It was a pleasure speaking with you today. Please call the office if needed Patient verbalizes understanding of instructions provided today.  Per our conversation I will remain part of your care team for the next 30 days.  If no needs are identified in the next 30 days, I will disconnect from the care team.  Casimer Lanius, [facility]  [phone]

## 2021-03-17 NOTE — Progress Notes (Signed)
Assessment/Plan:   Gait instability and falls with upgoing toe on the left (could be chronic from old stroke). -Patient has had very extensive testing as an inpatient at [facility].   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  [facility] contemplated a lumbar puncture.  It is unclear why this would be needed, as MRI seemed to just demonstrate appropriate ex vacuo changes, as opposed to ventricular dilation out of proportion to atrophy.  At this point, I want to hold on that especially given the risk of doing a lumbar puncture.  We would have to take him off of Coumadin, which is also risky.  Pt has also declined this today -wonder if dealing with an atypical state.  He describes significant retropulsion, which would favor PSP.  However, patient does not look significantly parkinsonian.  This potentially could be because he is treated with levodopa, although the atypical state is generally do not respond well to levodopa.  In addition, his exam is very confusing because of the fact that he has had a stroke with residual left-sided symptoms.  We will proceed with DaT  -Patient very much would like to have resources in his area for exercise.  There are certainly those available, but again would like to get him a more definitive diagnosis before we send him.  2.  Depression  -not well controlled  -Admits to lifelong history of this, but worse because not ambulating well.  -He does ask about counseling resources.  We will be happy to get him those.  I would like to get him a more definitive diagnosis so that they can address that, so we will plan to address more next visit.  -Patient does not think the Cymbalta is helping.  Primary  care may need to address this.   3. Constipation  -discussed nature and pathophysiology and association with PD  -discussed importance of hydration.  Pt is to increase water intake  -pt is given a copy of the rancho recipe  -recommended daily colace  -recommended miralax prn   Subjective:   [name] was seen today in the movement disorders clinic for neurologic consultation at the request of Martyn Malay, MD.  The consultation is for the evaluation of possible Parkinson's disease.  Patient previously seen by [facility] neurology.  Extensive review of records was undertaken.  Patient has also been seen by Dr. Rexene Alberts, last seen in March, 2022.  Dr. Rexene Alberts noted that patient went to the emergency room in March, 2022 with sudden onset of severe imbalance and that the patient had work-up including CTA of the head and neck, MRI I of the brain and cervical spine, which she noted were nonacute.  It appears that she felt his symptoms were due to vertigo.  She noted that patient's wife was worried about Parkinson's but Dr. Rexene Alberts did not feel that the patient had any signs of parkinsonism.  Patient unfortunately continued to have falls and presented to the emergency room at [facility] on July 3 after another fall.  Patient was felt to be parkinsonian and was started on levodopa.  They noted that patient had cogwheel rigidity in the right upper extremity, shuffling gait and propensity to lean backwards.  Patient had EMG that was unremarkable.  Myopathy labs were  normal.  Paraneoplastic/autoimmune labs were normal.  MRI brain that demonstrated chronic white matter disease and diffuse atrophy.  They wanted to do a DaTscan, but apparently those were not able to be done as an inpatient at [facility].  Patient subsequently followed up with outpatient neurology at [facility] on August 4.  They noted that they were going to consider a possible lumbar puncture pending MRI of his cervical, thoracic, lumbar spine, as that was unable to be done  as an inpatient because he had a paraspinal hematoma after his fall on Coumadin.  However, the patient was back on Coumadin (and lumbar punctures cannot be done on Coumadin).  Patient's MRI cervical, thoracic, lumbar spine which were all done with and without contrast) were done on January 31, 2021 at [facility].  I only have those reports.  There was severe neuroforaminal stenosis at C5-C6 bilaterally and C6-C7 on the left.  The thoracic spine was essentially unremarkable.  There was disc bulging there was fairly diffuse throughout the lumbar spine.  Patient was seen by the resident at the [facility] primary care/family medicine clinic on September 30.  She changed his carbidopa/levodopa 25/100, 1 tablet 3 times per day to carbidopa/levodopa 50/200 CR 3 times per day.  Wife states that he got very nauseated with taking just 2 of the 25/100 dosages and so they are taking one at 6am/2pm/10pm.  Pt doesn't think that this medication has been helpful at all.  Biggest c/o is currently constipation.   Specific Symptoms:  Tremor: Yes.  , occasionally in both hands - pt states it occurs if sitting there but wife notes if manipulating something Family hx of similar:  No. Per pt (wife wonders if GF with Parkinsons Disease) Voice: no change Sleep: awakens multiple times to urinate  Vivid Dreams:  Yes.    Acting out dreams:  No., not generally Wet Pillows: Yes.  , some Postural symptoms:  Yes.    Falls?  Yes.  , always falls backwards, none in the last month Bradykinesia symptoms: difficulty getting out of a chair/sofa; drags the feet (has dragged the L leg since CVA at age 70) Loss of smell:  will smell odors that are not there Loss of taste:  "altered taste" Urinary Incontinence:  No. But has to be careful Difficulty Swallowing:  No. Handwriting, micrographia: Yes.   Trouble with ADL's:  No.  Trouble buttoning clothing: Yes.   Depression:  Yes.  Just saw [facility] primary on September 30 and those notes are reviewed.   Admitted to passive suicidal ideation at that point per notes.  No changes were made to his Cymbalta.  He did declined counseling but asks about it today.  Pt states that he has always had some depression.   Memory changes:  yes, short term Hallucinations:  No.  visual distortions: No. N/V:  Yes.   (Associated with levodopa) Lightheaded:  No.  Syncope: No. Diplopia:  No. Dyskinesia:  No. Prior exposure to reglan/antipsychotics: No.  (He has been on amiodarone since what appears to be December, 2019)    PREVIOUS MEDICATIONS: Sinemet  ALLERGIES:   Allergies  Allergen Reactions   Bactrim [Sulfamethoxazole-Trimethoprim] Anaphylaxis    Ulcers   Ciprofloxacin Swelling    Tongue   Crestor [Rosuvastatin Calcium]     Severe Myalgias    CURRENT MEDICATIONS:  Current Outpatient Medications  Medication Instructions   acetaminophen (TYLENOL) 650 mg, Oral, Every 6 hours PRN   amiodarone (PACERONE) 200 mg, Oral, Daily   aspirin 81 mg,  Oral, Daily with supper   bisoprolol (ZEBETA) 2.5 mg, Oral, Daily   carbidopa-levodopa (SINEMET IR) 25-100 MG tablet 1 tablet, Oral, 3 times daily, Times: 6AM, 2PM, & 10PM   diphenhydramine-acetaminophen (TYLENOL PM) 25-500 MG TABS tablet 2 tablets, Oral, At bedtime PRN   DULoxetine (CYMBALTA) 40 mg, Oral, Daily   fluticasone (FLONASE) 50 MCG/ACT nasal spray 1 spray, Each Nare, At bedtime PRN   lisinopril (ZESTRIL) 40 mg, Oral, Daily with supper, For blood pressure.   Multiple Vitamins-Minerals (MULTIVITAMIN WITH MINERALS) tablet 1 tablet, Oral, Daily with supper, Centrum Silver    nitroGLYCERIN (NITROSTAT) 0.4 mg, Sublingual, Every 5 min PRN   omeprazole (PRILOSEC) 40 MG capsule TAKE 1 CAPSULE (40 MG TOTAL) BY MOUTH 2 (TWO) TIMES DAILY BEFORE A MEAL. FOR HEARTBURN.   ondansetron (ZOFRAN ODT) 4 mg, Oral, Every 8 hours PRN   Praluent 75 mg, Subcutaneous, Every 14 days   pravastatin (PRAVACHOL) 80 mg, Oral, Daily, For cholesterol.   psyllium  (HYDROCIL/METAMUCIL) 95 % PACK 1 packet, Oral, Daily   senna (SENOKOT) 8.6 mg, Oral, Daily, For constipation   tiZANidine (ZANAFLEX) 4 MG tablet TAKE 1 TABLET BY MOUTH AT BEDTIME AS NEEDED FOR MUSCLE SPASMS.   triamcinolone cream (KENALOG) 0.1 % 1 application, Topical, Daily PRN   vitamin B-12 (CYANOCOBALAMIN) 1,000 mcg, Oral, Daily with supper   warfarin (COUMADIN) 3.75-7.5 mg, Oral, See admin instructions, Take 1 tablet (7.5mg  ) by mouth on Sun and Thurs. All other days take 1/2 tablet (3.75mg ) at supper time    Objective:   VITALS:   Vitals:   03/20/21 0937  BP: 126/79  Pulse: (!) 58  SpO2: 98%  Weight: 179 lb 3.2 oz (81.3 kg)  Height: 6' (1.829 m)    GEN:  The patient appears stated age and is in NAD. HEENT:  Normocephalic, atraumatic.  The mucous membranes are moist. The superficial temporal arteries are without ropiness or tenderness. CV:  RRR Lungs:  CTAB Neck/HEME:  There are no carotid bruits bilaterally.  Neurological examination:  Orientation: The patient is alert and oriented x3.  Cranial nerves: There is good facial symmetry. Extraocular muscles are intact. No square wave jerks.  The visual fields are full to confrontational testing. The speech is fluent and clear. Soft palate rises symmetrically and there is no tongue deviation. Hearing is intact to conversational tone. Sensation: Sensation is intact to light and pinprick throughout (facial, trunk, extremities). Vibration is intact at the bilateral big toe. There is no extinction with double simultaneous stimulation. There is no sensory dermatomal level identified. Motor: Strength is 5/5 in the bilateral upper and lower extremities.  There is trouble with finger abduction on the L   Shoulder shrug is equal and symmetric.  There is no pronator drift. Deep tendon reflexes: Deep tendon reflexes are 3+/4 at the bilateral biceps, triceps, brachioradialis, patella and achilles. Plantar response is neutral on the right and  upgoing on the L (striatal toe)  Movement examination: Tone: There is nl tone in the bilateral upper extremities.  The tone in the left lower extremity is increased Abnormal movements: none (but patient reports that he has this).  Was not able to elicit tremor today with distraction procedures. Coordination:  There is slowness of rapid alternating movements, particularly on the left hand especially with toe taps, but no real decremation.  Trouble with finger to nose on the L (chronic since stroke) Gait and Station: The patient pushes off of the transport chair to arise.  He is given  a walker.  He slightly drags the left leg.  He is short stepped, but does not necessarily shuffle, even when the walker is taken away.  He is mildly unstable.  He does poorly approximate the wheelchair when he gets back to it to sit. I have reviewed and interpreted the following labs independently   Chemistry      Component Value Date/Time   NA 137 01/14/2021 0130   NA 138 12/23/2020 1112   K 3.8 01/14/2021 0130   CL 103 01/14/2021 0130   CO2 27 01/14/2021 0130   BUN 13 01/14/2021 0130   BUN 15 12/23/2020 1112   CREATININE 1.00 01/14/2021 0130   CREATININE 1.07 07/14/2019 1439   GLU 102 10/16/2014 1156      Component Value Date/Time   CALCIUM 8.5 (L) 01/14/2021 0130   ALKPHOS 94 01/13/2021 0205   AST 21 01/13/2021 0205   ALT 15 01/13/2021 0205   BILITOT 0.6 01/13/2021 0205   BILITOT 0.6 12/23/2020 1112      Lab Results  Component Value Date   TSH 2.080 08/16/2020   Lab Results  Component Value Date   WBC 8.0 01/14/2021   HGB 13.0 01/14/2021   HCT 39.0 01/14/2021   MCV 95.6 01/14/2021   PLT 188 01/14/2021     Total time spent on today's visit was 80 minutes, including both face-to-face time and nonface-to-face time.  Time included that spent on review of records (prior notes available to me/labs/imaging if pertinent), discussing treatment and goals, answering patient's questions and  coordinating care.  Cc:  [name], MD

## 2021-03-17 NOTE — Chronic Care Management (AMB) (Signed)
Care Management  Clinical Social Work Note  03/17/2021 Name: [name] MRN: [id number] DOB: 1945/12/02  [name] is a 75 y.o. year old male who is a primary care patient of [name], MD. The CCM team was consulted for assistance with care coordination needs. Advanced Directive Education and Mental Health Counseling and Resources   Consent to Services:  The patient was given information about Care Management services, agreed to services, and gave verbal consent prior to initiation of services.  Please see initial visit note for detailed documentation.   Patient agreed to services today and consent obtained.  Engaged with patient by phone in response to provider referral for social work care coordination services:  Assessment/Interventions: Assessed patient's current treatment, progress, coping skills, support system and barriers to care.  Has not made progress with advance directive and has decided not to move forward with counseling. .  See Care Plan below for interventions and patient self-care actives.  Recommendation: Patient may benefit from, and is in agreement allow LCSW to mail packet to his home.   Follow up Plan:  Patient does not require or desire continued follow-up by CCM LCSW. Will contact the office if needed Patient may benefit from and is in agreement for CCM LCSW to remain part of care team for the next 30 days.  If no needs are identified in the next 30 days, CCM LCSW will disconnect from the care team.    Review of patient past medical history, allergies, medications, and health status, including review of pertinent consultant reports was performed as part of comprehensive evaluation and provision of care management/care coordination services.   SDOH (Social Determinants of Health) screening and interventions performed today:   Advanced Directives Status:See Care Plan for related entries     Care Plan    Conditions to be addressed/monitored per PCP  order: Depression,   Patient Care Plan: General Social Work (Adult)     Problem Identified: Coping Skills      Goal: Coping Skills Enhanced by connecting with counseling   Start Date: 01/30/2021  Recent Progress: Not on track  Priority: High  Note:   Current barriers:   Has decided not to move forward with counseling  Unable to afford co-pay for counseling  Chronic Mental Health needs related to symptoms of depression  Needs Support, Education, and Care Coordination in order to meet unmet mental health needs. Clinical Goal(s): patient will follow up with insurance provider ref. Co-pay for therapy* as directed by SW  Clinical Interventions:  Inter-disciplinary care team collaboration (see longitudinal plan of care) Assessed patient's current treatment, coping skills, support system and barriers to care  Review various resources, discussed options and provided patient information about Enhanced Benefits connected with insurance provider  and Options for mental health treatment based on need and insurance Patient does not have enhanced benefits with insurance provider Co-pay for counseling is $45.00 per session  Discussed [place] program for information about various Medicare Health plan ( patient declined he has someone that helps with this) Solution-Focused Strategies, Active listening / Reflection utilized , Emotional Supportive Provided, and Problem Solving [place]  ; Patient Goals/Self-Care Activities:  Continue with compliance of taking medication      Problem Identified: No Advance Directive      Goal: Complete Advance Directive   Start Date: 02/05/2021  This Visit's Progress: On track  Recent Progress: On track  Priority: Medium  Note:   Current barriers:   Requested to have a packet mailed to  his home Patient does not have an advance directive.  Needs education, support and coordination in order to meet this need. Clinical Goal(s): Over the next 30 days, the  patient will review information on advance directive, complete advance directive packet and have notarized.  Interventions: Inter-disciplinary care team collaboration (see longitudinal plan of care) Assessed understanding of Advance Directives. A voluntary discussion about advanced care planning including importance of advanced directives, healthcare proxy and living will was discussed with the patient and wife  Patient Goals/Self-Care Activities :  Per your request a packet will be mailed to your home Review educational information on Advance Directive  Complete Advance Directive packet,  Have advance directive notarized and provide a copy to provider office       Casimer Lanius, [place] / [place]   [phone]

## 2021-03-18 DIAGNOSIS — I4891 Unspecified atrial fibrillation: Secondary | ICD-10-CM | POA: Diagnosis not present

## 2021-03-18 DIAGNOSIS — W19XXXD Unspecified fall, subsequent encounter: Secondary | ICD-10-CM | POA: Diagnosis not present

## 2021-03-18 DIAGNOSIS — Z955 Presence of coronary angioplasty implant and graft: Secondary | ICD-10-CM | POA: Diagnosis not present

## 2021-03-18 DIAGNOSIS — I69354 Hemiplegia and hemiparesis following cerebral infarction affecting left non-dominant side: Secondary | ICD-10-CM | POA: Diagnosis not present

## 2021-03-18 DIAGNOSIS — K219 Gastro-esophageal reflux disease without esophagitis: Secondary | ICD-10-CM | POA: Diagnosis not present

## 2021-03-18 DIAGNOSIS — I252 Old myocardial infarction: Secondary | ICD-10-CM | POA: Diagnosis not present

## 2021-03-18 DIAGNOSIS — I10 Essential (primary) hypertension: Secondary | ICD-10-CM | POA: Diagnosis not present

## 2021-03-19 DIAGNOSIS — I4891 Unspecified atrial fibrillation: Secondary | ICD-10-CM | POA: Diagnosis not present

## 2021-03-19 DIAGNOSIS — W19XXXD Unspecified fall, subsequent encounter: Secondary | ICD-10-CM | POA: Diagnosis not present

## 2021-03-19 DIAGNOSIS — I69354 Hemiplegia and hemiparesis following cerebral infarction affecting left non-dominant side: Secondary | ICD-10-CM | POA: Diagnosis not present

## 2021-03-19 DIAGNOSIS — I252 Old myocardial infarction: Secondary | ICD-10-CM | POA: Diagnosis not present

## 2021-03-19 DIAGNOSIS — K219 Gastro-esophageal reflux disease without esophagitis: Secondary | ICD-10-CM | POA: Diagnosis not present

## 2021-03-19 DIAGNOSIS — I10 Essential (primary) hypertension: Secondary | ICD-10-CM | POA: Diagnosis not present

## 2021-03-19 DIAGNOSIS — Z955 Presence of coronary angioplasty implant and graft: Secondary | ICD-10-CM | POA: Diagnosis not present

## 2021-03-20 ENCOUNTER — Ambulatory Visit: Payer: Medicare HMO | Admitting: Neurology

## 2021-03-20 ENCOUNTER — Encounter: Payer: Self-pay | Admitting: Neurology

## 2021-03-20 ENCOUNTER — Other Ambulatory Visit: Payer: Self-pay

## 2021-03-20 ENCOUNTER — Other Ambulatory Visit: Payer: Self-pay | Admitting: Family Medicine

## 2021-03-20 VITALS — BP 126/79 | HR 58 | Ht 72.0 in | Wt 179.2 lb

## 2021-03-20 DIAGNOSIS — R292 Abnormal reflex: Secondary | ICD-10-CM

## 2021-03-20 DIAGNOSIS — R296 Repeated falls: Secondary | ICD-10-CM

## 2021-03-20 DIAGNOSIS — R251 Tremor, unspecified: Secondary | ICD-10-CM

## 2021-03-20 DIAGNOSIS — G251 Drug-induced tremor: Secondary | ICD-10-CM | POA: Insufficient documentation

## 2021-03-20 DIAGNOSIS — R110 Nausea: Secondary | ICD-10-CM

## 2021-03-20 NOTE — Patient Instructions (Signed)
Constipation and Parkinson's disease:  1.Rancho recipe for constipation in Parkinsons Disease:  -1 cup of unprocessed bran (need to get this at [employer], [employer] or similar type of store), 2 cups of applesauce in 1 cup of prune juice 2.  Increase fiber intake (Metamucil,vegetables) 3.  Regular, moderate exercise can be beneficial. 4.  Avoid medications causing constipation, such as medications like antacids with calcium or magnesium 5.  It's okay to take daily Miralax, and taper if stools become too loose or you experience diarrhea 6.  Stool softeners (Colace) can help with chronic constipation and I recommend you take this daily. 7.  Increase water intake.  You should be drinking 1/2 gallon of water a day as long as you have not been diagnosed with congestive heart failure or renal/kidney failure.  This is probably the single greatest thing that you can do to help your constipation.  

## 2021-03-26 ENCOUNTER — Ambulatory Visit: Payer: Medicare HMO | Admitting: *Deleted

## 2021-03-26 ENCOUNTER — Other Ambulatory Visit: Payer: Self-pay

## 2021-03-26 DIAGNOSIS — I10 Essential (primary) hypertension: Secondary | ICD-10-CM | POA: Diagnosis not present

## 2021-03-26 DIAGNOSIS — R002 Palpitations: Secondary | ICD-10-CM

## 2021-03-26 DIAGNOSIS — I252 Old myocardial infarction: Secondary | ICD-10-CM | POA: Diagnosis not present

## 2021-03-26 DIAGNOSIS — I4891 Unspecified atrial fibrillation: Secondary | ICD-10-CM | POA: Diagnosis not present

## 2021-03-26 DIAGNOSIS — I69354 Hemiplegia and hemiparesis following cerebral infarction affecting left non-dominant side: Secondary | ICD-10-CM | POA: Diagnosis not present

## 2021-03-26 DIAGNOSIS — Z7901 Long term (current) use of anticoagulants: Secondary | ICD-10-CM | POA: Diagnosis not present

## 2021-03-26 DIAGNOSIS — Z955 Presence of coronary angioplasty implant and graft: Secondary | ICD-10-CM | POA: Diagnosis not present

## 2021-03-26 DIAGNOSIS — W19XXXD Unspecified fall, subsequent encounter: Secondary | ICD-10-CM | POA: Diagnosis not present

## 2021-03-26 DIAGNOSIS — K219 Gastro-esophageal reflux disease without esophagitis: Secondary | ICD-10-CM | POA: Diagnosis not present

## 2021-03-26 LAB — POCT INR: INR: 3.1 — AB (ref 2.0–3.0)

## 2021-03-26 NOTE — Patient Instructions (Signed)
Description   Do not take any Warfarin tonight, then continue taking 1/2 tablet daily except for 1 tablet on Sundays and Thursdays.  Recheck INR in 4 weeks. [facility] [phone].

## 2021-03-27 DIAGNOSIS — I252 Old myocardial infarction: Secondary | ICD-10-CM | POA: Diagnosis not present

## 2021-03-27 DIAGNOSIS — Z955 Presence of coronary angioplasty implant and graft: Secondary | ICD-10-CM | POA: Diagnosis not present

## 2021-03-27 DIAGNOSIS — I69354 Hemiplegia and hemiparesis following cerebral infarction affecting left non-dominant side: Secondary | ICD-10-CM | POA: Diagnosis not present

## 2021-03-27 DIAGNOSIS — I4891 Unspecified atrial fibrillation: Secondary | ICD-10-CM | POA: Diagnosis not present

## 2021-03-27 DIAGNOSIS — W19XXXD Unspecified fall, subsequent encounter: Secondary | ICD-10-CM | POA: Diagnosis not present

## 2021-03-27 DIAGNOSIS — K219 Gastro-esophageal reflux disease without esophagitis: Secondary | ICD-10-CM | POA: Diagnosis not present

## 2021-03-27 DIAGNOSIS — I10 Essential (primary) hypertension: Secondary | ICD-10-CM | POA: Diagnosis not present

## 2021-04-01 DIAGNOSIS — I252 Old myocardial infarction: Secondary | ICD-10-CM | POA: Diagnosis not present

## 2021-04-01 DIAGNOSIS — I69354 Hemiplegia and hemiparesis following cerebral infarction affecting left non-dominant side: Secondary | ICD-10-CM | POA: Diagnosis not present

## 2021-04-01 DIAGNOSIS — Z955 Presence of coronary angioplasty implant and graft: Secondary | ICD-10-CM | POA: Diagnosis not present

## 2021-04-01 DIAGNOSIS — W19XXXD Unspecified fall, subsequent encounter: Secondary | ICD-10-CM | POA: Diagnosis not present

## 2021-04-01 DIAGNOSIS — K219 Gastro-esophageal reflux disease without esophagitis: Secondary | ICD-10-CM | POA: Diagnosis not present

## 2021-04-01 DIAGNOSIS — I10 Essential (primary) hypertension: Secondary | ICD-10-CM | POA: Diagnosis not present

## 2021-04-01 DIAGNOSIS — I4891 Unspecified atrial fibrillation: Secondary | ICD-10-CM | POA: Diagnosis not present

## 2021-04-03 ENCOUNTER — Other Ambulatory Visit: Payer: Self-pay | Admitting: Gastroenterology

## 2021-04-03 DIAGNOSIS — K219 Gastro-esophageal reflux disease without esophagitis: Secondary | ICD-10-CM

## 2021-04-06 ENCOUNTER — Other Ambulatory Visit: Payer: Self-pay | Admitting: Family Medicine

## 2021-04-07 ENCOUNTER — Other Ambulatory Visit: Payer: Self-pay | Admitting: Cardiovascular Disease

## 2021-04-07 NOTE — Telephone Encounter (Signed)
Please review

## 2021-04-08 DIAGNOSIS — W19XXXD Unspecified fall, subsequent encounter: Secondary | ICD-10-CM | POA: Diagnosis not present

## 2021-04-08 DIAGNOSIS — Z955 Presence of coronary angioplasty implant and graft: Secondary | ICD-10-CM | POA: Diagnosis not present

## 2021-04-08 DIAGNOSIS — I252 Old myocardial infarction: Secondary | ICD-10-CM | POA: Diagnosis not present

## 2021-04-08 DIAGNOSIS — I10 Essential (primary) hypertension: Secondary | ICD-10-CM | POA: Diagnosis not present

## 2021-04-08 DIAGNOSIS — I4891 Unspecified atrial fibrillation: Secondary | ICD-10-CM | POA: Diagnosis not present

## 2021-04-08 DIAGNOSIS — K219 Gastro-esophageal reflux disease without esophagitis: Secondary | ICD-10-CM | POA: Diagnosis not present

## 2021-04-08 DIAGNOSIS — I69354 Hemiplegia and hemiparesis following cerebral infarction affecting left non-dominant side: Secondary | ICD-10-CM | POA: Diagnosis not present

## 2021-04-09 ENCOUNTER — Encounter: Payer: Self-pay | Admitting: Family Medicine

## 2021-04-09 ENCOUNTER — Ambulatory Visit (INDEPENDENT_AMBULATORY_CARE_PROVIDER_SITE_OTHER): Payer: Medicare HMO | Admitting: Family Medicine

## 2021-04-09 ENCOUNTER — Other Ambulatory Visit: Payer: Self-pay

## 2021-04-09 VITALS — BP 160/99 | HR 60 | Wt 176.4 lb

## 2021-04-09 DIAGNOSIS — F339 Major depressive disorder, recurrent, unspecified: Secondary | ICD-10-CM

## 2021-04-09 DIAGNOSIS — I10 Essential (primary) hypertension: Secondary | ICD-10-CM | POA: Diagnosis not present

## 2021-04-09 DIAGNOSIS — G259 Extrapyramidal and movement disorder, unspecified: Secondary | ICD-10-CM

## 2021-04-09 DIAGNOSIS — R69 Illness, unspecified: Secondary | ICD-10-CM | POA: Diagnosis not present

## 2021-04-09 MED ORDER — CITALOPRAM HYDROBROMIDE 10 MG PO TABS: 20.0000 mg | ORAL_TABLET | Freq: Every day | ORAL | 0 refills | Status: DC

## 2021-04-09 NOTE — Progress Notes (Signed)
    SUBJECTIVE:   CHIEF COMPLAINT / HPI:   Depression Follow-Up Patient with longstanding hx of depression, which has been worse over the past year due to his declining health and loss of independence. Taking Cymbalta 40mg  daily which he does not find particularly helpful. Did not tolerate 60mg  in the past (made him anxious, couldn't sleep, mild hallucinations). Has been interested in switching medications but there were financial barriers and we wanted to hold off while adjusting other medications at recent visits. Still with passive SI several days per week. No plan, no intent. Reports he has some days that are good days.  Movement Disorder Saw neurology on 03/20/2021 which went well- they are doing additional studies (DaTscan) to investigate his movement disorder. Remains on Sinemet 25-100mg  TID (did not tolerate increased dose).  Reports he walked up the stairs yesterday (with his wife's support) which is a HUGE feat, as he has been predominately wheelchair bound for months.   PERTINENT  PMH / PSH: Unspecified movement disorder, depression, pAF, CAD, thyroid nodule  OBJECTIVE:   BP (!) 160/99   Pulse 60   Wt 176 lb 6.4 oz (80 kg)   SpO2 98%   BMI 23.92 kg/m   General: NAD, pleasant, able to participate in exam Cardiac: RRR, S1 S2 present. normal heart sounds, no murmurs. Respiratory: CTAB, normal effort, No wheezes, rales or rhonchi Extremities: no edema Neuro: alert, oriented x3, CN II-XII intact, 5/5 strength in all extremities, no resting tremor, short-stepped and mildly unsteady gait Psych: Mildly withdrawn but overall normal mood and affect   ASSESSMENT/PLAN:   Depression, recurrent (HCC) PHQ-9 score of 16 today, positive response to question #9. Endorses intermittent passive SI but no intent or plan. This is unchanged from prior visits over the past few months. Hx of underlying depression exacerbated by loss of independence and decline in overall health/functional  status. Treatment has been somewhat complicated by financial barriers. -Stop Cymbalta 40mg  daily -Start Citalopram 20mg  daily ($0 copay) -Plan to increase Citalopram to 40mg  daily as tolerated at follow-up -Can also consider addition of Mirtazapine ($5 copay) to Citalopram at future visits -Patient has declined therapy but will let me know if he changes his mind  Essential hypertension BP elevated to 160/99 today. BP goal <150/90. BP 126/79 at neuro visit a few weeks ago. -Continue to monitor -Consider additional anti-hypertensives if persistently elevated at future visits   Movement Disorder -Followed by neuro -Continue home health PT/OT   [name], MD [place]

## 2021-04-09 NOTE — Patient Instructions (Addendum)
It was great to see you!  Things we discussed at today's visit: - We will change your Duloxetine to a new medication called Citalopram 20mg . There is no need for a taper or washout.  - See me back in early January and we can consider increasing the dose.  - I will work on getting the records from urology to help facilitate getting your medication  Take care and seek immediate care sooner if you develop any concerns.  Dr. [facility]

## 2021-04-10 ENCOUNTER — Encounter (HOSPITAL_COMMUNITY)
Admission: RE | Admit: 2021-04-10 | Discharge: 2021-04-10 | Disposition: A | Discharge status: Home / Self Care | Payer: Medicare HMO | Source: Ambulatory Visit | Attending: Neurology | Admitting: Neurology

## 2021-04-10 DIAGNOSIS — R296 Repeated falls: Secondary | ICD-10-CM | POA: Diagnosis not present

## 2021-04-10 DIAGNOSIS — R292 Abnormal reflex: Secondary | ICD-10-CM | POA: Diagnosis not present

## 2021-04-10 DIAGNOSIS — R251 Tremor, unspecified: Secondary | ICD-10-CM | POA: Diagnosis not present

## 2021-04-10 DIAGNOSIS — R2689 Other abnormalities of gait and mobility: Secondary | ICD-10-CM | POA: Diagnosis not present

## 2021-04-10 DIAGNOSIS — G20 Parkinson's disease: Secondary | ICD-10-CM | POA: Diagnosis not present

## 2021-04-10 MED ORDER — IOFLUPANE I 123 185 MBQ/2.5ML IV SOLN
4.9000 | Freq: Once | INTRAVENOUS | Status: AC | PRN
  Administered 2021-04-10: 4.9 via INTRAVENOUS
  Filled 2021-04-10: qty 5

## 2021-04-10 MED ORDER — POTASSIUM IODIDE (ANTIDOTE) 130 MG PO TABS
ORAL_TABLET | ORAL | Status: AC
  Filled 2021-04-10: qty 1

## 2021-04-10 MED ORDER — POTASSIUM IODIDE (ANTIDOTE) 130 MG PO TABS: 130.0000 mg | ORAL_TABLET | Freq: Once | ORAL | Status: DC

## 2021-04-10 NOTE — Assessment & Plan Note (Signed)
BP elevated to 160/99 today. BP goal <150/90. BP 126/79 at neuro visit a few weeks ago. -Continue to monitor -Consider additional anti-hypertensives if persistently elevated at future visits

## 2021-04-10 NOTE — Assessment & Plan Note (Signed)
PHQ-9 score of 16 today, positive response to question #9. Endorses intermittent passive SI but no intent or plan. This is unchanged from prior visits over the past few months. Hx of underlying depression exacerbated by loss of independence and decline in overall health/functional status. Treatment has been somewhat complicated by financial barriers. -Stop Cymbalta 40mg  daily -Start Citalopram 20mg  daily ($0 copay) -Plan to increase Citalopram to 40mg  daily as tolerated at follow-up -Can also consider addition of Mirtazapine ($5 copay) to Citalopram at future visits -Patient has declined therapy but will let me know if he changes his mind

## 2021-04-11 ENCOUNTER — Other Ambulatory Visit: Payer: Self-pay

## 2021-04-11 DIAGNOSIS — K219 Gastro-esophageal reflux disease without esophagitis: Secondary | ICD-10-CM

## 2021-04-11 MED ORDER — OMEPRAZOLE 40 MG PO CPDR: DELAYED_RELEASE_CAPSULE | ORAL | 1 refills | Status: DC

## 2021-04-13 ENCOUNTER — Other Ambulatory Visit: Payer: Self-pay | Admitting: Family Medicine

## 2021-04-13 DIAGNOSIS — I10 Essential (primary) hypertension: Secondary | ICD-10-CM

## 2021-04-15 ENCOUNTER — Encounter: Payer: Self-pay | Admitting: Family Medicine

## 2021-04-15 ENCOUNTER — Ambulatory Visit (INDEPENDENT_AMBULATORY_CARE_PROVIDER_SITE_OTHER): Payer: Medicare HMO | Admitting: Family Medicine

## 2021-04-15 ENCOUNTER — Other Ambulatory Visit: Payer: Self-pay

## 2021-04-15 DIAGNOSIS — I10 Essential (primary) hypertension: Secondary | ICD-10-CM

## 2021-04-15 DIAGNOSIS — S50861A Insect bite (nonvenomous) of right forearm, initial encounter: Secondary | ICD-10-CM | POA: Diagnosis not present

## 2021-04-15 DIAGNOSIS — W57XXXA Bitten or stung by nonvenomous insect and other nonvenomous arthropods, initial encounter: Secondary | ICD-10-CM | POA: Diagnosis not present

## 2021-04-15 NOTE — Patient Instructions (Signed)
It was wonderful to see you today.  Please bring ALL of your medications with you to every visit.   Today we talked about:  -We do not have any Lyme disease in this area, so I don't think any treatment needs to be done. -If the redness worsens, if you develop fevers or if the area becomes swelling or painful please come back.  -Use creams to hydrate your skin.   Thank you for choosing [facility].   Please call [phone] with any questions about today's appointment.  Please be sure to schedule follow up at the front  desk before you leave today.   Sharion Settler, DO PGY-2 Family Medicine

## 2021-04-15 NOTE — Assessment & Plan Note (Signed)
Unspecified insect bite to right forearm.  Patient was concerned for possible tick exposure.  There is a punctate lesion with some surrounding ecchymosis and a ring.  There is no erythematous streaking or swelling at the site.  It is nontender.  We discussed that it would be very usual and rare to see Lyme's disease in this area.  He did not actually visualize a tick so it is possible that this was caused by different insect. Given that he is asymptomatic, we will monitor for now. Discussed signs that would be concerning for infection/cellulitis including increased erythema, swelling, pain and/or fevers.

## 2021-04-15 NOTE — Progress Notes (Signed)
    SUBJECTIVE:   CHIEF COMPLAINT / HPI:   Concern for Tick Bite [name] is a 75 y.o. male who is accompanied by his wife today. He presents due to concern for tick bite to his right forearm. He noticed that it looked red and there is a slight red ring around it which concerned him and his wife. He never actually saw a tick. He believes he may have got it from his dog. The dog sleeps in bed with them. Patient notes he has had ticks in the past for which he has received antibiotic shots. Dog is up to date on vaccines. Has body aches but this is not new for him.  Hypertension Takes all his medications. Has no chest pain, SOB, lower extremity swelling, headaches or vision changes. States home readings range 140s-170's over 80's-90s.   PERTINENT  PMH / PSH: Afib, HTN, CVA, MI, suspected movement disorder.   OBJECTIVE:   BP (!) 170/93   Pulse (!) 58   Wt 175 lb (79.4 kg)   SpO2 98%   BMI 23.73 kg/m    General: NAD, pleasant, in wheelchair, appears stated age, able to participate in exam Cardiac: RRR Respiratory: Normal work of breathing, CTAB Extremities: no edema or cyanosis. Skin: right forearm with a punctate lesion with surrounding ring of ecchymosis- see picture below Psych: Normal affect and mood     ASSESSMENT/PLAN:   Insect bite Unspecified insect bite to right forearm.  Patient was concerned for possible tick exposure.  There is a punctate lesion with some surrounding ecchymosis and a ring.  There is no erythematous streaking or swelling at the site.  It is nontender.  We discussed that it would be very usual and rare to see Lyme's disease in this area.  He did not actually visualize a tick so it is possible that this was caused by different insect. Given that he is asymptomatic, we will monitor for now. Discussed signs that would be concerning for infection/cellulitis including increased erythema, swelling, pain and/or fevers.   Essential hypertension Hypertensive  to 170/93 today.  He was asymptomatic.  He is on bisoprolol and lisinopril and reports good compliance. Per chart review, appears his goal is <150/90. Discussed scheduling a visit with PCP for hypertension management and possible adjustment to medications if he remains elevated.      Sharion Settler, [place]

## 2021-04-15 NOTE — Assessment & Plan Note (Signed)
Hypertensive to 170/93 today.  He was asymptomatic.  He is on bisoprolol and lisinopril and reports good compliance. Per chart review, appears his goal is <150/90. Discussed scheduling a visit with PCP for hypertension management and possible adjustment to medications if he remains elevated.

## 2021-04-18 DIAGNOSIS — W19XXXD Unspecified fall, subsequent encounter: Secondary | ICD-10-CM | POA: Diagnosis not present

## 2021-04-18 DIAGNOSIS — Z955 Presence of coronary angioplasty implant and graft: Secondary | ICD-10-CM | POA: Diagnosis not present

## 2021-04-18 DIAGNOSIS — I4891 Unspecified atrial fibrillation: Secondary | ICD-10-CM | POA: Diagnosis not present

## 2021-04-18 DIAGNOSIS — I252 Old myocardial infarction: Secondary | ICD-10-CM | POA: Diagnosis not present

## 2021-04-18 DIAGNOSIS — K219 Gastro-esophageal reflux disease without esophagitis: Secondary | ICD-10-CM | POA: Diagnosis not present

## 2021-04-18 DIAGNOSIS — I10 Essential (primary) hypertension: Secondary | ICD-10-CM | POA: Diagnosis not present

## 2021-04-18 DIAGNOSIS — I69354 Hemiplegia and hemiparesis following cerebral infarction affecting left non-dominant side: Secondary | ICD-10-CM | POA: Diagnosis not present

## 2021-04-21 ENCOUNTER — Telehealth: Payer: Self-pay

## 2021-04-21 DIAGNOSIS — N3281 Overactive bladder: Secondary | ICD-10-CM

## 2021-04-21 DIAGNOSIS — G259 Extrapyramidal and movement disorder, unspecified: Secondary | ICD-10-CM

## 2021-04-21 DIAGNOSIS — R296 Repeated falls: Secondary | ICD-10-CM

## 2021-04-21 NOTE — Telephone Encounter (Signed)
Patient calls nurse line regarding questions about medication authorization from [facility].   Patient reports that he spoke with Dr. [name] regarding medication and that she was going to reach out to urology office for more information. Patient is unable to provide the name of the medication.   Please advise.   Talbot Grumbling, RN

## 2021-04-23 ENCOUNTER — Other Ambulatory Visit: Payer: Self-pay

## 2021-04-23 ENCOUNTER — Ambulatory Visit: Payer: Medicare HMO | Admitting: *Deleted

## 2021-04-23 DIAGNOSIS — Z7901 Long term (current) use of anticoagulants: Secondary | ICD-10-CM

## 2021-04-23 DIAGNOSIS — R002 Palpitations: Secondary | ICD-10-CM

## 2021-04-23 LAB — POCT INR: INR: 2.4 (ref 2.0–3.0)

## 2021-04-23 NOTE — Patient Instructions (Signed)
Description   Continue taking 1/2 tablet daily except for 1 tablet on Sundays and Thursdays.  Recheck INR in 5 weeks. Coumadin Clinic [phone].

## 2021-04-29 NOTE — Telephone Encounter (Signed)
Received office visit notes from [facility]. Patient was prescribed Solifenacin (Vesicare) for OAB but never picked up because of prohibitive costs.  Patient and his wife were requesting names of alternative medications to see if they are covered by their insurance (they have a copy of the formulary). I called and spoke with patient's wife, [name]. Informed her of the class of medication (anti-cholinergic) and gave examples (oxybutynin, tolterodine, darifenacin, etc). She will see what's covered and then reach out to the urologist.  Of note, also received fax from [facility] that patient has completed home health PT. He would benefit from ongoing PT due to movement disorder (likely Parkinson's) and balance issues, but is now able to attend PT outside the home. Will place referral to [facility] on [facility].

## 2021-05-02 ENCOUNTER — Other Ambulatory Visit: Payer: Self-pay | Admitting: Family Medicine

## 2021-05-02 DIAGNOSIS — F339 Major depressive disorder, recurrent, unspecified: Secondary | ICD-10-CM

## 2021-05-12 NOTE — Progress Notes (Signed)
Assessment/Plan:   Gait instability and falls with upgoing toe on the left (could be chronic from old stroke). -Patient has had very extens ive testing as an inpatient at [facility].   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  Reviewed today.  I am going to go ahead and discontinue his levodopa and see how he looks off of levodopa.  .  I will plan on seeing him back in 8-10 weeks to see how he looks off of the medication.  To me, he does not look like he has Parkinson's disease, although I still think atypical state cannot completely be ruled out.   -Patient is planning to start physical therapy soon. -Patient's exam findings are confused by the fact that he has had a stroke with residual left-sided symptoms.   2.  Depression  -Patient does not think the Cymbalta is helping.  Primary care may need to address this.     Subjective:   Update May 15, 2021: Patient has been doing fairly well since our last visit.  He is able to go up and down the stairs, which she was not able to do.  Part of him thinks he is doing a little bit worse, but he is supposed to start physical therapy soon.  He has no double vision.  He has a little bit of trouble swallowing solids.  He admits to dragging the left leg, but thinks that is residual from his old stroke.  [name] was seen today in the movement disorders clinic for neurologic consultation at the request of [name], MD.  The consultation is for the evaluation of possible Parkinson's disease.  Patient previously seen by [facility] neurology.  Extensive review of records was undertaken.  Patient has also been seen by Dr. Rexene Alberts, last seen in March, 2022.  Dr. Rexene Alberts noted that patient went to the emergency room in March, 2022 with  sudden onset of severe imbalance and that the patient had work-up including CTA of the head and neck, MRI I of the brain and cervical spine, which she noted were nonacute.  It appears that she felt his symptoms were due to vertigo.  She noted that patient's wife was worried about Parkinson's but Dr. Rexene Alberts did not feel that the patient had any signs of parkinsonism.  Patient unfortunately continued to have falls and presented to the emergency room at [facility] on July 3 after another fall.  Patient was felt to be parkinsonian and was started on levodopa.  They noted that patient had cogwheel rigidity in the right upper extremity, shuffling gait and propensity to lean backwards.  Patient had EMG that was unremarkable.  Myopathy labs were normal.  Paraneoplastic/autoimmune labs were normal.  MRI brain that demonstrated chronic white matter disease and diffuse atrophy.  They wanted to do a DaTscan, but apparently those were not able to be done as an inpatient at [facility].  Patient subsequently followed up with outpatient neurology at [facility] on August 4.  They noted that they were going to consider a possible lumbar puncture pending MRI of his cervical, thoracic, lumbar spine, as that was unable to be done as an inpatient because he had a paraspinal hematoma after his fall on Coumadin.  However, the patient was back on Coumadin (and lumbar punctures cannot be done on Coumadin).  Patient's  MRI cervical, thoracic, lumbar spine which were all done with and without contrast) were done on January 31, 2021 at [facility].  I only have those reports.  There was severe neuroforaminal stenosis at C5-C6 bilaterally and C6-C7 on the left.  The thoracic spine was essentially unremarkable.  There was disc bulging there was fairly diffuse throughout the lumbar spine.  Patient was seen by the resident at the [facility] primary care/family medicine clinic on September 30.  She changed his carbidopa/levodopa 25/100, 1 tablet 3 times per day to carbidopa/levodopa  50/200 CR 3 times per day.  Wife states that he got very nauseated with taking just 2 of the 25/100 dosages and so they are taking one at 6am/2pm/10pm.  Pt doesn't think that this medication has been helpful at all.  Biggest c/o is currently constipation.       PREVIOUS MEDICATIONS: Sinemet  ALLERGIES:   Allergies  Allergen Reactions   Bactrim [Sulfamethoxazole-Trimethoprim] Anaphylaxis    Ulcers   Ciprofloxacin Swelling    Tongue   Crestor [Rosuvastatin Calcium]     Severe Myalgias    CURRENT MEDICATIONS:  Current Outpatient Medications  Medication Instructions   acetaminophen (TYLENOL) 650 mg, Oral, Every 6 hours PRN   amiodarone (PACERONE) 200 mg, Oral, Daily   amiodarone (PACERONE) 200 mg, Oral, Daily   aspirin 81 mg, Oral, Daily with supper   bisoprolol (ZEBETA) 2.5 mg, Oral, Daily   bisoprolol (ZEBETA) 5 mg, Oral, Daily   citalopram (CELEXA) 40 mg, Oral, Daily   diphenhydramine-acetaminophen (TYLENOL PM) 25-500 MG TABS tablet 2 tablets, Oral, At bedtime PRN   fluticasone (FLONASE) 50 MCG/ACT nasal spray 1 spray, Each Nare, At bedtime PRN   lisinopril (ZESTRIL) 40 mg, Oral, Daily with supper, For blood pressure.   Multiple Vitamins-Minerals (MULTIVITAMIN WITH MINERALS) tablet 1 tablet, Oral, Daily with supper, Centrum Silver    nitroGLYCERIN (NITROSTAT) 0.4 mg, Sublingual, Every 5 min PRN   omeprazole (PRILOSEC) 40 MG capsule TAKE 1 CAPSULE (40 MG TOTAL) BY MOUTH 2 (TWO) TIMES DAILY BEFORE A MEAL. FOR HEARTBURN.   ondansetron (ZOFRAN-ODT) 4 MG disintegrating tablet TAKE 1 TABLET BY MOUTH EVERY 8 HOURS AS NEEDED FOR NAUSEA AND VOMITING   Praluent 75 mg, Subcutaneous, Every 14 days   pravastatin (PRAVACHOL) 80 mg, Oral, Daily, For cholesterol.   psyllium (HYDROCIL/METAMUCIL) 95 % PACK 1 packet, Oral, Daily   senna (SENOKOT) 8.6 mg, Oral, Daily, For constipation   tiZANidine (ZANAFLEX) 4 MG tablet TAKE 1 TABLET BY MOUTH AT BEDTIME AS NEEDED FOR MUSCLE SPASMS.    triamcinolone cream (KENALOG) 0.1 % 1 application, Topical, Daily PRN   trospium (SANCTURA) 20 mg, Oral, 2 times daily   vitamin B-12 (CYANOCOBALAMIN) 1,000 mcg, Oral, Daily with supper   warfarin (COUMADIN) 3.75-7.5 mg, Oral, See admin instructions, Take 1 tablet (7.5mg  ) by mouth on Sun and Thurs. All other days take 1/2 tablet (3.75mg ) at supper time    Objective:   VITALS:   Vitals:   05/15/21 1309  BP: 124/82  Pulse: 87  SpO2: 97%  Weight: 180 lb 9.6 oz (81.9 kg)  Height: 6' (1.829 m)     GEN:  The patient appears stated age and is in NAD. HEENT:  Normocephalic, atraumatic.  The mucous membranes are moist. The superficial temporal arteries are without ropiness or tenderness. CV:  RRR Lungs:  CTAB Neck/HEME:  There are no carotid bruits bilaterally.  Neurological examination:  Orientation: The patient is alert and oriented x3.  Cranial nerves: There is  good facial symmetry.  Slight facial hypomimia.  Extraocular muscles are intact. No square wave jerks.  The visual fields are full to confrontational testing. The speech is fluent and clear. Soft palate rises symmetrically and there is no tongue deviation. Hearing is intact to conversational tone. Sensation: Sensation is intact to light to light touch throughout. Motor: Strength is 5/5 in the bilateral upper and lower extremities.  There is trouble with finger abduction on the L   Shoulder shrug is equal and symmetric.  There is no pronator drift. Deep tendon reflexes: Deep tendon reflexes are 3+/4 at the bilateral biceps, triceps, brachioradialis, patella and achilles. Plantar response is neutral on the right and upgoing on the L (striatal toe)  Movement examination: Tone: There is nl tone in the bilateral upper extremities.  The tone in the left lower extremity is increased Abnormal movements: None Coordination:  There is slowness of rapid alternating movements on the left (patient reports chronic since stroke) Gait and  Station: The patient pushes off of the transport chair to arise.  He states that he can do this without his walker.  He drags the left leg.  He does not shuffle, but is not particularly stable.   I have reviewed and interpreted the following labs independently   Chemistry      Component Value Date/Time   NA 137 01/14/2021 0130   NA 138 12/23/2020 1112   K 3.8 01/14/2021 0130   CL 103 01/14/2021 0130   CO2 27 01/14/2021 0130   BUN 13 01/14/2021 0130   BUN 15 12/23/2020 1112   CREATININE 1.00 01/14/2021 0130   CREATININE 1.07 07/14/2019 1439   GLU 102 10/16/2014 1156      Component Value Date/Time   CALCIUM 8.5 (L) 01/14/2021 0130   ALKPHOS 94 01/13/2021 0205   AST 21 01/13/2021 0205   ALT 15 01/13/2021 0205   BILITOT 0.6 01/13/2021 0205   BILITOT 0.6 12/23/2020 1112      Lab Results  Component Value Date   TSH 2.080 08/16/2020   Lab Results  Component Value Date   WBC 8.0 01/14/2021   HGB 13.0 01/14/2021   HCT 39.0 01/14/2021   MCV 95.6 01/14/2021   PLT 188 01/14/2021     Cc:  [name], MD

## 2021-05-13 ENCOUNTER — Telehealth: Payer: Self-pay

## 2021-05-13 DIAGNOSIS — F339 Major depressive disorder, recurrent, unspecified: Secondary | ICD-10-CM

## 2021-05-13 NOTE — Telephone Encounter (Signed)
Patient calls nurse line reporting he would to increase Citalopram to 40mg  as discussed. Patient reports he has been tolerating the 20mg  well and has noticed a significant increase in his mood.   Patient reports he has not picked up 11/28 prescription yet. Patient would like for PCP to write for 40mg  so he doesn't have to take 4 pills at once.   Please advise. Patient has a FU apt in January.

## 2021-05-14 MED ORDER — CITALOPRAM HYDROBROMIDE 40 MG PO TABS: 40.0000 mg | ORAL_TABLET | Freq: Every day | ORAL | 3 refills | Status: DC

## 2021-05-14 NOTE — Telephone Encounter (Signed)
Rx sent for Citalopram 40mg .

## 2021-05-15 ENCOUNTER — Encounter: Payer: Self-pay | Admitting: Neurology

## 2021-05-15 ENCOUNTER — Ambulatory Visit: Payer: Medicare HMO | Admitting: Neurology

## 2021-05-15 ENCOUNTER — Other Ambulatory Visit: Payer: Self-pay

## 2021-05-15 VITALS — BP 124/82 | HR 87 | Ht 72.0 in | Wt 180.6 lb

## 2021-05-15 DIAGNOSIS — R2681 Unsteadiness on feet: Secondary | ICD-10-CM | POA: Diagnosis not present

## 2021-05-15 DIAGNOSIS — R531 Weakness: Secondary | ICD-10-CM

## 2021-05-23 ENCOUNTER — Ambulatory Visit: Payer: Medicare HMO | Attending: Family Medicine | Admitting: Physical Therapy

## 2021-05-23 ENCOUNTER — Encounter: Payer: Self-pay | Admitting: Physical Therapy

## 2021-05-23 ENCOUNTER — Other Ambulatory Visit: Payer: Self-pay

## 2021-05-23 DIAGNOSIS — R296 Repeated falls: Secondary | ICD-10-CM | POA: Diagnosis present

## 2021-05-23 DIAGNOSIS — R2689 Other abnormalities of gait and mobility: Secondary | ICD-10-CM | POA: Diagnosis present

## 2021-05-23 DIAGNOSIS — G259 Extrapyramidal and movement disorder, unspecified: Secondary | ICD-10-CM | POA: Diagnosis not present

## 2021-05-23 DIAGNOSIS — R2681 Unsteadiness on feet: Secondary | ICD-10-CM | POA: Diagnosis present

## 2021-05-23 DIAGNOSIS — M6281 Muscle weakness (generalized): Secondary | ICD-10-CM | POA: Insufficient documentation

## 2021-05-23 DIAGNOSIS — M21372 Foot drop, left foot: Secondary | ICD-10-CM | POA: Diagnosis present

## 2021-05-25 NOTE — Therapy (Signed)
[facility] [address] [place], [place], [address] Phone: [phone]   Fax:  [phone]  Physical Therapy Evaluation  Patient Details  Name: [name] MRN: [id number] Date of Birth: September 28, 1945 Referring Provider (PT): Andria Frames Jamal Collin, MD; [name], MD   Encounter Date: 05/23/2021   PT End of Session - 05/25/21 1532     Visit Number 1    Number of Visits 17    Date for PT Re-Evaluation 07/24/21    Authorization Type [employer] but is changing to [employer] in the [place] Year    Progress Note Due on Visit 10    PT Start Time 8676    PT Stop Time 1530    PT Time Calculation (min) 45 min    Activity Tolerance Patient tolerated treatment well    Behavior During Therapy [facility]: Lambert Rhonda W for tasks assessed/performed             Past Medical History:  Diagnosis Date   BPH (benign prostatic hyperplasia)    COPD (chronic obstructive pulmonary disease) ([place]) 2021   Coronary artery disease    a. s/p PCI to LCx in 2007; b. MV 2017 no sig ischemia, EF 59%, nl study. c. Cath 01/2020 without significant recurrent dz.   Depression    GERD (gastroesophageal reflux disease)    Hyperlipidemia    Hypertension    IBS (irritable bowel syndrome)    Myocardial infarction ([place])    12-13 yrs ago    PAF (paroxysmal atrial fibrillation) ([place])    a. CHADS2VASc => 5 (HTN, age x 1, stroke x 2, vascular disease); b. on Coumadin    PNA (pneumonia)    Skin cancer    face    Sleep apnea    off cpap    Stroke ([place])    a. x 2 with residual left-sided weakness    Past Surgical History:  Procedure Laterality Date   [place]  2006   X1 STENT   COLONOSCOPY     CORONARY ANGIOPLASTY     POLYPECTOMY     RIGHT/LEFT HEART CATH AND CORONARY ANGIOGRAPHY N/A 01/25/2020   Procedure: RIGHT/LEFT HEART CATH AND CORONARY ANGIOGRAPHY;  Surgeon: Minna Merritts, MD;  Location: [facility];   Service: Cardiovascular;  Laterality: N/A;   SKIN CANCER EXCISION N/A 08/2016   [place].  [facility] (Dr. Deliah Boston)   THYROIDECTOMY, PARTIAL     UPPER GASTROINTESTINAL ENDOSCOPY     VASCULAR SURGERY     varicose vein stripping left leg    There were no vitals filed for this visit.    05/23/21 1455  Symptoms/Limitations  Subjective Not sure if patient has Parkinson's or one of its "cousins".  Started last Thanksgiving, walking slower, and significant pain in LE when he sleeps.  Had multiple falls and trips to the ED due to hitting his head and was getting weaker.  Last hospitalization was in the summer and was D/C home with HHPT and OT, a RW, and now has a transport w/c and manual w/c.  With [facility] pt was able to progress to ambulating with a cane and going up and down the stairs.  Feels like he is getting a little mores shaky and has had two falls since finishing therapies and coming off Levadopa a week ago by Dr. Carles Collet.  Patient is accompained by: Family member  Pertinent History BPH, COPD, CAD, depression, HLD, HTN, IBS, MI, PAF, OSA,  skin CA, CVA with L sided residual weakness, coronary angioplasty  Limitations Walking;Standing  Patient Stated Goals To be able to walk with a cane; to improve balance - pt falls backwards  Pain Assessment  Currently in Pain? No/denies (pain in LE at night; not during the day)      05/23/21 1507  Assessment  Medical Diagnosis G25.9 (ICD-10-CM) - Movement disorder  R29.6 (ICD-10-CM) - Frequent falls  Referring Provider (PT) Andria Frames, Jamal Collin, MD; [name], MD  Onset Date/Surgical Date 04/29/21  Prior Therapy HHPT and HHOT  Precautions  Precautions Fall  Precaution Comments BPH, COPD, CAD, depression, HLD, HTN, IBS, MI, PAF, OSA, skin CA, CVA with L sided residual weakness, coronary angioplasty  Balance Screen  Has the patient fallen in the past 6 months Yes  How many times? 6  Has the patient had a decrease in activity level  because of a fear of falling?  Yes  [place] Private residence  Living Arrangements Spouse/significant other  Type of [place] Access Level entry  [place] Two level  Alternate Level Stairs-Number of Steps L shaped flight  Alternate Level Stairs-Rails Right  [place] - 2 wheels;Cane - single point;Wheelchair - manual;Grab bars - toilet;Grab bars - tub/shower  Prior Function  Level of Independence Independent  Sensation  Light Touch Appears Intact  ROM / Strength  AROM / PROM / Strength Strength  Strength  Overall Strength Deficits  Overall Strength Comments RLE: 5/5 hip flexion, 4/5 knee extension and flexion, 4/5 ankle DF.  LLE: 3+/5 hip flexion, 4/5 knee flexion and extension, 4/5 ankle DF  Transfers  Transfers Sit to Stand;Stand to Sit;Stand Pivot Transfers  Sit to Stand 5: Supervision;4: Min guard  Sit to Stand Details (indicate cue type and reason) from transport wheelchair and mat; intermittent min guard due to intermittent posterior LOB  Stand to Sit 5: Supervision  Stand Pivot Transfers 4: Min guard  Stand Pivot Transfer Details (indicate cue type and reason) Min guard due to intermittent posterior LOB  Ambulation/Gait  Ambulation/Gait Yes  Ambulation/Gait Assistance 4: Min assist  Ambulation/Gait Assistance Details Min A without AD.  As pt fatigued he was noted to have increased L foot slap and toe drag  Ambulation Distance (Feet) 115 Feet  Assistive device None  Gait Pattern Step-through pattern;Decreased step length - left;Decreased dorsiflexion - left;Decreased trunk rotation;Poor foot clearance - left  Ambulation Surface Level;Indoor  Stairs Yes  Stairs Assistance 4: Min assist;3: Mod assist  Stairs Assistance Details (indicate cue type and reason) Ascended and descended with step to sequence; therapist in front of patient holding gait belt due to LOB posterior when descending  Stair Management Technique Two  rails;Step to pattern;Forwards  Number of Stairs 4  Height of Stairs 6  Standardized Balance Assessment  Standardized Balance Assessment Mini-BESTest;TUG  Mini-BESTest  Sit To Stand 2  Rise to Toes 0  Stand on one leg (left) 0  Stand on one leg (right) 0  Stand on one leg - lowest score 0  Compensatory Stepping Correction - Forward 1  Compensatory Stepping Correction - Backward 2  Compensatory Stepping Correction - Left Lateral 2  Compensatory Stepping Correction - Right Lateral 2  Stepping Corredtion Lateral - lowest score 2  Stance - Feet together, eyes open, firm surface  1  Stance - Feet together, eyes closed, foam surface  0  Incline - Eyes Closed 1  Change in Gait Speed 1  Walk with head  turns - Horizontal 1  Walk with pivot turns 1  Step over obstacles 1  Timed UP & GO with Dual Task 0  Mini-BEST total score 13  Timed Up and Go Test  TUG Normal TUG;Cognitive TUG  Normal TUG (seconds) 19.5  Cognitive TUG (seconds) 42.22  TUG Comments count backwards by 3 from 30; 120% difference between TUG and COG TUG     Objective measurements completed on examination: See above findings.      PT Education - 05/25/21 1532     Education Details Clinical findings, PT POC and goals    Person(s) Educated Patient;Spouse    Methods Explanation    Comprehension Verbalized understanding              PT Short Term Goals - 05/25/21 1609       PT SHORT TERM GOAL #1   Title Pt will demonstrate ability to perform initial HEP safely with supervision.    Time 4    Period Weeks    Status New    Target Date 06/24/21      PT SHORT TERM GOAL #2   Title Pt will demonstrate ability to perform sit <> stand from variety of surfaces with consistent supervision without posterior LOB    Time 4    Period Weeks    Status New    Target Date 06/24/21      PT SHORT TERM GOAL #3   Title Pt will improve Mini-Best Test by 4 points to indicate decreased falls risk    Baseline 13    Time 4     Period Weeks    Status New    Target Date 06/24/21      PT SHORT TERM GOAL #4   Title Pt will demonstrate <100% difference between TUG and COG TUG to indicate improved safety/balance during dual tasking    Baseline 120% difference    Time 4    Period Weeks    Status New    Target Date 06/24/21      PT SHORT TERM GOAL #5   Title Patient will negotiate 12 stairs with one rail and cane safely with supervision    Baseline Min A for safety    Time 4    Period Weeks    Status New    Target Date 06/24/21               PT Long Term Goals - 05/25/21 1624       PT LONG TERM GOAL #1   Title Pt will demonstrate ability to perform final HEP with supervision    Time 8    Period Weeks    Status New    Target Date 07/24/21      PT LONG TERM GOAL #2   Title Pt will demonstrate improvement in Mini-Best Test to >/= 20 points to indicate decreased falls risk    Time 8    Period Weeks    Status New    Target Date 07/24/21      PT LONG TERM GOAL #3   Title Pt will demonstrate WFL TUG and will demonstrate <80% difference between TUG and COG TUG to indicate improved safety during dual task    Time 8    Period Weeks    Status New    Target Date 07/24/21      PT LONG TERM GOAL #4   Title Pt will ambulate 230' over level surfaces without AD with supervision  Baseline ambulates with cane    Time 8    Period Weeks    Status New    Target Date 07/24/21      PT LONG TERM GOAL #5   Title Pt will consistently perform sit <> stand transfers MOD I without posterior LOB    Time 8    Period Weeks    Status New    Target Date 07/24/21               Plan - 05/25/21 1534     Clinical Impression Statement Pt is a 75 year old male referred to Neuro OPPT for evaluation of movement disorder and frequent falls.  Pt's PMH is significant for the following: BPH, COPD, CAD, depression, HLD, HTN, IBS, MI, PAF, OSA, skin CA, CVA with L sided residual weakness, coronary angioplasty. The  following deficits were noted during pt's exam: impaired LE strength, impaired static and dynamic standing balance, impaired balance reactions, impaired dual tasking, and abnormalities of gait.  Pt's Mini-Best Test score indicates pt is at high risk for falls. Pt would benefit from skilled PT to address these impairments and functional limitations to maximize functional mobility independence and reduce falls risk.    Personal Factors and Comorbidities Comorbidity 3+;Past/Current Experience;Time since onset of injury/illness/exacerbation    Comorbidities BPH, COPD, CAD, depression, HLD, HTN, IBS, MI, PAF, OSA, skin CA, CVA with L sided residual weakness, coronary angioplasty, falls, movement disorder - diagnosis undetermined    Examination-Activity Limitations Locomotion Level;Stairs;Stand;Transfers    Examination-Participation Restrictions Community Activity    Stability/Clinical Decision Making Evolving/Moderate complexity    Clinical Decision Making Moderate    Rehab Potential Good    PT Frequency 2x / week    PT Duration 8 weeks    PT Treatment/Interventions ADLs/Self Care Home Management;DME Instruction;Gait training;Stair training;Functional mobility training;Therapeutic activities;Therapeutic exercise;Balance training;Neuromuscular re-education;Orthotic Fit/Training;Patient/family education;Passive range of motion    PT Next Visit Plan Performed TUG/Mini-Best Test; perform other outcome measures as indicated (DGI/gait velocity?).  Initiate HEP.  Continue gait training with cane; progress off of cane eventually?  Stair Therapist, nutritional.  Balance reaction training-posterior LOB.  Foot up brace for LLE when fatigued??    Consulted and Agree with Plan of Care Patient             Patient will benefit from skilled therapeutic intervention in order to improve the following deficits and impairments:  Abnormal gait, Decreased balance, Decreased strength, Difficulty walking, Pain  Visit  Diagnosis: Repeated falls  Other abnormalities of gait and mobility  Muscle weakness (generalized)  Foot drop, left  Unsteadiness on feet     Problem List Patient Active Problem List   Diagnosis Date Noted   Insect bite 04/15/2021   Drug-induced tremor 03/20/2021   Depression, recurrent ([place]) 01/18/2021   Urinary frequency 01/18/2021   Partial small bowel obstruction ([place]) 01/13/2021   Thyroid nodule 12/23/2020   Suspected Movement disorder- possibly PD  12/22/2020   Hematoma- Paraspinal from Fall  12/22/2020   Frequent falls 11/13/2020   Orthostatic dizziness 08/28/2020   Statin myopathy 06/07/2019   Chronic obstructive pulmonary disease ([place]) 06/13/2018   Sleep apnea 09/16/2017   GERD (gastroesophageal reflux disease) 02/08/2015   IBS (irritable bowel syndrome) 02/08/2015   Essential hypertension 02/08/2015   AF (paroxysmal atrial fibrillation) ([place]) 01/23/2015   CAD (coronary artery disease) 01/23/2015   History of CVA (cerebrovascular accident) 01/23/2015    Rico Junker, PT, DPT 05/25/21    4:36 PM  [facility] [address] [place], [place], [address] Phone: [phone]   Fax:  [phone]  Name: Drayven Marchena MRN: [id number] Date of Birth: 03-03-1946

## 2021-05-26 ENCOUNTER — Other Ambulatory Visit: Payer: Self-pay

## 2021-05-26 ENCOUNTER — Ambulatory Visit: Payer: Medicare HMO | Admitting: Physical Therapy

## 2021-05-26 ENCOUNTER — Encounter: Payer: Self-pay | Admitting: Physical Therapy

## 2021-05-26 DIAGNOSIS — R2681 Unsteadiness on feet: Secondary | ICD-10-CM | POA: Diagnosis not present

## 2021-05-26 DIAGNOSIS — M21372 Foot drop, left foot: Secondary | ICD-10-CM | POA: Diagnosis not present

## 2021-05-26 DIAGNOSIS — R296 Repeated falls: Secondary | ICD-10-CM

## 2021-05-26 DIAGNOSIS — R2689 Other abnormalities of gait and mobility: Secondary | ICD-10-CM

## 2021-05-26 DIAGNOSIS — M6281 Muscle weakness (generalized): Secondary | ICD-10-CM | POA: Diagnosis not present

## 2021-05-26 DIAGNOSIS — G259 Extrapyramidal and movement disorder, unspecified: Secondary | ICD-10-CM | POA: Diagnosis not present

## 2021-05-26 NOTE — Patient Instructions (Signed)
Access Code: [email or web address] URL: [email or web address] Date: 05/26/2021 Prepared by: Janann August  Exercises Supine Bridge - 2 x daily - 5 x weekly - 1-2 sets - 10 reps Sit to Stand with Counter Support - 1-2 x daily - 5 x weekly - 2 sets - 5 reps Mini Squat with Counter Support - 2 x daily - 5 x weekly - 1 sets - 10 reps Heel Toe Raises with Counter Support - 1 x daily - 5 x weekly - 1-2 sets - 10 reps Lateral Weight Shift - 1 x daily - 5 x weekly - 2 sets - 10 reps

## 2021-05-26 NOTE — Therapy (Signed)
[facility] [address] [place], [place], [address] Phone: [phone]   Fax:  [phone]  Physical Therapy Treatment  Patient Details  Name: [name] MRN: [id number] Date of Birth: 05-Oct-1945 Referring Provider (PT): Andria Frames Jamal Collin, MD; [name], MD   Encounter Date: 05/26/2021   PT End of Session - 05/26/21 1632     Visit Number 2    Number of Visits 17    Date for PT Re-Evaluation 07/24/21    Authorization Type [employer] but is changing to [employer] in the [place] Year    Progress Note Due on Visit 10    PT Start Time 1446    PT Stop Time 1531    PT Time Calculation (min) 45 min    Equipment Utilized During Treatment Gait belt    Activity Tolerance Patient tolerated treatment well    Behavior During Therapy [facility] for tasks assessed/performed             Past Medical History:  Diagnosis Date   BPH (benign prostatic hyperplasia)    COPD (chronic obstructive pulmonary disease) ([place]) 2021   Coronary artery disease    a. s/p PCI to LCx in 2007; b. MV 2017 no sig ischemia, EF 59%, nl study. c. Cath 01/2020 without significant recurrent dz.   Depression    GERD (gastroesophageal reflux disease)    Hyperlipidemia    Hypertension    IBS (irritable bowel syndrome)    Myocardial infarction ([place])    12-13 yrs ago    PAF (paroxysmal atrial fibrillation) ([place])    a. CHADS2VASc => 5 (HTN, age x 1, stroke x 2, vascular disease); b. on Coumadin    PNA (pneumonia)    Skin cancer    face    Sleep apnea    off cpap    Stroke ([place])    a. x 2 with residual left-sided weakness    Past Surgical History:  Procedure Laterality Date   [place]  2006   X1 STENT   COLONOSCOPY     CORONARY ANGIOPLASTY     POLYPECTOMY     RIGHT/LEFT HEART CATH AND CORONARY ANGIOGRAPHY N/A 01/25/2020   Procedure: RIGHT/LEFT HEART CATH AND CORONARY ANGIOGRAPHY;  Surgeon: Minna Merritts, MD;  Location: [facility];  Service: Cardiovascular;  Laterality: N/A;   SKIN CANCER EXCISION N/A 08/2016   [place].  [facility] (Dr. Deliah Boston)   THYROIDECTOMY, PARTIAL     UPPER GASTROINTESTINAL ENDOSCOPY     VASCULAR SURGERY     varicose vein stripping left leg    There were no vitals filed for this visit.   Subjective Assessment - 05/26/21 1448     Subjective Was trying to get out of his w/c at home and then transfer to the sofa. Lost his balance going to the L and almost had a fall. 2 biggest things are the weakness in his legs and his balance. Primarily uses the RW at home for mobility. Sometimes needs to use a manual w/c at home due to fatigue.    Patient is accompained by: Family member    Pertinent History BPH, COPD, CAD, depression, HLD, HTN, IBS, MI, PAF, OSA, skin CA, CVA with L sided residual weakness, coronary angioplasty    Limitations Walking;Standing    Patient Stated Goals To be able to walk with a cane; to improve balance - pt falls backwards    Currently in  Pain? No/denies                               [facility] Adult PT Treatment/Exercise - 05/26/21 1524       Transfers   Transfers Sit to Stand;Stand to [employer] Transfers    Sit to Stand 5: Supervision;4: Min guard    Sit to Stand Details (indicate cue type and reason) From transport chair and mat table, cues to scoot out towards edge, tuck feet underneath, and for incr forward lean to stand to decr posterior LOB. Performed 2 x 5 reps throughout session - added to HEP    Stand to Sit 5: Supervision    Stand to Sit Details Cued to back all the way up to the mat/chair and feel BLE on surface before reaching back.    Stand Pivot Transfers 5: Supervision    Stand Pivot Transfer Details (indicate cue type and reason) Performed with RW, cues to turn all the way with RW before sitting down.      Ambulation/Gait   Ambulation/Gait Yes    Ambulation/Gait  Assistance 5: Supervision;4: Min guard    Ambulation/Gait Assistance Details Pt with decr foot clearance with LLE, decr weight shifting through toes during stance with decr push off bilat.    Ambulation Distance (Feet) 115 Feet    Assistive device Rolling walker    Gait Pattern Step-through pattern;Decreased step length - left;Decreased dorsiflexion - left;Decreased trunk rotation;Poor foot clearance - left;Decreased stance time - left;Decreased dorsiflexion - right;Decreased weight shift to left;Shuffle    Ambulation Surface Level;Indoor    Gait velocity 35.07 seconds = .93 ft/sec    Gait Comments Educated on importance of using RW at all times at home due to fall risk.               Access Code: [email or web address] URL: [email or web address] Date: 05/26/2021 Prepared by: Janann August  Initiated HEP for BLE strengthening at standing balance. See MedBridge for further details. Pt to perform standing exercises with spouse supervision either at RW or at sink.   Exercises Supine Bridge - 2 x daily - 5 x weekly - 1-2 sets - 10 reps Sit to Stand with Counter Support - 1-2 x daily - 5 x weekly - 2 sets - 5 reps - performed at mat with RW in front of him.  Standing at sink with w/c behind him for safety:  Mini Squat with Counter Support - 2 x daily - 5 x weekly - 1 sets - 10 reps Heel Toe Raises with Counter Support - 1 x daily - 5 x weekly - 1-2 sets - 10 reps Lateral Weight Shift - 1 x daily - 5 x weekly - 2 sets - 10 reps - cued to gently lift up heel when shifting weight.        PT Education - 05/26/21 1632     Education Details Initial HEP, safety with transfers, using RW at all times at home due to fall risk.    Person(s) Educated Patient;Spouse    Methods Explanation;Demonstration;Handout    Comprehension Verbalized understanding;Returned demonstration              PT Short Term Goals - 05/26/21 1635       PT SHORT TERM GOAL #1   Title Pt will demonstrate  ability to perform initial HEP safely with supervision.    Time 4    Period Weeks    Status  New    Target Date 06/24/21      PT SHORT TERM GOAL #2   Title Pt will demonstrate ability to perform sit <> stand from variety of surfaces with consistent supervision without posterior LOB    Time 4    Period Weeks    Status New    Target Date 06/24/21      PT SHORT TERM GOAL #3   Title Pt will improve Mini-Best Test by 4 points to indicate decreased falls risk    Baseline 13    Time 4    Period Weeks    Status New    Target Date 06/24/21      PT SHORT TERM GOAL #4   Title Pt will demonstrate <100% difference between TUG and COG TUG to indicate improved safety/balance during dual tasking    Baseline 120% difference    Time 4    Period Weeks    Status New    Target Date 06/24/21      PT SHORT TERM GOAL #5   Title Patient will negotiate 12 stairs with one rail and cane safely with supervision    Baseline Min A for safety    Time 4    Period Weeks    Status New    Target Date 06/24/21      Additional Short Term Goals   Additional Short Term Goals Yes      PT SHORT TERM GOAL #6   Title Pt will improve gait speed with RW vs. LRAD to at least 1.3 Ft/sec in order to demo decr fall risk/improved community mobility.    Baseline .93 FT/SEC with RW    Time 4    Period Weeks    Status New               PT Long Term Goals - 05/26/21 1633       PT LONG TERM GOAL #1   Title Pt will demonstrate ability to perform final HEP with supervision    Time 8    Period Weeks    Status New    Target Date 07/24/21      PT LONG TERM GOAL #2   Title Pt will demonstrate improvement in Mini-Best Test to >/= 20 points to indicate decreased falls risk    Time 8    Period Weeks    Status New    Target Date 07/24/21      PT LONG TERM GOAL #3   Title Pt will demonstrate WFL TUG and will demonstrate <80% difference between TUG and COG TUG to indicate improved safety during dual task    Time  8    Period Weeks    Status New    Target Date 07/24/21      PT LONG TERM GOAL #4   Title Pt will ambulate 230' over level surfaces without AD with supervision    Baseline ambulates with cane    Time 8    Period Weeks    Status New    Target Date 07/24/21      PT LONG TERM GOAL #5   Title Pt will consistently perform sit <> stand transfers MOD I without posterior LOB    Time 8    Period Weeks    Status New    Target Date 07/24/21      Additional Long Term Goals   Additional Long Term Goals Yes      PT LONG TERM GOAL #6   Title  Pt will improve gait speed with RW vs. LRAD to at least 2.0 ft/sec in order to demo decr fall risk/improved community mobility.    Baseline .93 ft/sec with RW    Time 8    Period Weeks    Status New                   Plan - 05/26/21 1636     Clinical Impression Statement Assessed pt's gait speed today with pt ambulating .35 ft/sec with RW, indicating an incr fall risk and a household ambulator.Updated STGs/LTGs. Educated to make sure to use RW at all times for safety with gait. Worked on initiating pt's HEP for BLE strengthening and standing balance/weight shifting for pt to perform with wife's supervision. Pt tolerated session well, was a little fatigued after exercises. Will continue to progress towards LTGs.    Personal Factors and Comorbidities Comorbidity 3+;Past/Current Experience;Time since onset of injury/illness/exacerbation    Comorbidities BPH, COPD, CAD, depression, HLD, HTN, IBS, MI, PAF, OSA, skin CA, CVA with L sided residual weakness, coronary angioplasty, falls, movement disorder - diagnosis undetermined    Examination-Activity Limitations Locomotion Level;Stairs;Stand;Transfers    Examination-Participation Restrictions Community Activity    Stability/Clinical Decision Making Evolving/Moderate complexity    Rehab Potential Good    PT Frequency 2x / week    PT Duration 8 weeks    PT Treatment/Interventions ADLs/Self Care Home  Management;DME Instruction;Gait training;Stair training;Functional mobility training;Therapeutic activities;Therapeutic exercise;Balance training;Neuromuscular re-education;Orthotic Fit/Training;Patient/family education;Passive range of motion    PT Next Visit Plan how is HEP? gait with RW, try with cane when pt is appropriate. Stair Therapist, nutritional.  Balance reaction training-posterior LOB. BLE strengthening. SciFit/NuStep. Foot up brace for LLE when fatigued??    Consulted and Agree with Plan of Care Patient             Patient will benefit from skilled therapeutic intervention in order to improve the following deficits and impairments:  Abnormal gait, Decreased balance, Decreased strength, Difficulty walking, Pain  Visit Diagnosis: Repeated falls  Other abnormalities of gait and mobility  Muscle weakness (generalized)  Unsteadiness on feet     Problem List Patient Active Problem List   Diagnosis Date Noted   Insect bite 04/15/2021   Drug-induced tremor 03/20/2021   Depression, recurrent ([place]) 01/18/2021   Urinary frequency 01/18/2021   Partial small bowel obstruction ([place]) 01/13/2021   Thyroid nodule 12/23/2020   Suspected Movement disorder- possibly PD  12/22/2020   Hematoma- Paraspinal from Fall  12/22/2020   Frequent falls 11/13/2020   Orthostatic dizziness 08/28/2020   Statin myopathy 06/07/2019   Chronic obstructive pulmonary disease ([place]) 06/13/2018   Sleep apnea 09/16/2017   GERD (gastroesophageal reflux disease) 02/08/2015   IBS (irritable bowel syndrome) 02/08/2015   Essential hypertension 02/08/2015   AF (paroxysmal atrial fibrillation) ([place]) 01/23/2015   CAD (coronary artery disease) 01/23/2015   History of CVA (cerebrovascular accident) 01/23/2015    Arliss Journey, PT, DPT  05/26/2021, 4:39 PM  [facility] [address] [place], [place], [address] Phone: [phone]   Fax:   [phone]  Name: [name] MRN: [id number] Date of Birth: 21-Oct-1945

## 2021-05-28 ENCOUNTER — Other Ambulatory Visit: Payer: Self-pay | Admitting: Family Medicine

## 2021-05-28 ENCOUNTER — Other Ambulatory Visit: Payer: Self-pay

## 2021-05-28 ENCOUNTER — Ambulatory Visit: Payer: Medicare HMO | Admitting: *Deleted

## 2021-05-28 DIAGNOSIS — R002 Palpitations: Secondary | ICD-10-CM | POA: Diagnosis not present

## 2021-05-28 DIAGNOSIS — Z7901 Long term (current) use of anticoagulants: Secondary | ICD-10-CM

## 2021-05-28 LAB — POCT INR: INR: 2.3 (ref 2.0–3.0)

## 2021-05-28 NOTE — Patient Instructions (Signed)
Description   Continue taking 1/2 tablet daily except for 1 tablet on Sundays and Thursdays.  Recheck INR in 6 weeks. [facility] [phone].

## 2021-06-03 ENCOUNTER — Other Ambulatory Visit: Payer: Self-pay | Admitting: Family Medicine

## 2021-06-03 ENCOUNTER — Encounter: Payer: Self-pay | Admitting: Neurology

## 2021-06-03 ENCOUNTER — Other Ambulatory Visit: Payer: Self-pay | Admitting: *Deleted

## 2021-06-03 DIAGNOSIS — I480 Paroxysmal atrial fibrillation: Secondary | ICD-10-CM

## 2021-06-03 MED ORDER — WARFARIN SODIUM 7.5 MG PO TABS: ORAL_TABLET | ORAL | 1 refills | Status: DC

## 2021-06-03 NOTE — Telephone Encounter (Signed)
Pt called and requested refill be sent.

## 2021-06-04 NOTE — Telephone Encounter (Signed)
Called patient and he agreed to start medication carbidopa levodopa again and to hold 48 hours before next appointment on January 25. I have set a reminder to call and remind him

## 2021-06-06 ENCOUNTER — Other Ambulatory Visit: Payer: Self-pay | Admitting: Family Medicine

## 2021-06-06 DIAGNOSIS — F339 Major depressive disorder, recurrent, unspecified: Secondary | ICD-10-CM

## 2021-06-11 ENCOUNTER — Other Ambulatory Visit: Payer: Self-pay

## 2021-06-11 ENCOUNTER — Ambulatory Visit: Payer: PPO | Attending: Podiatry | Admitting: Physical Therapy

## 2021-06-11 ENCOUNTER — Encounter: Payer: Self-pay | Admitting: Physical Therapy

## 2021-06-11 VITALS — BP 150/86 | HR 59

## 2021-06-11 DIAGNOSIS — R296 Repeated falls: Secondary | ICD-10-CM | POA: Insufficient documentation

## 2021-06-11 DIAGNOSIS — R2681 Unsteadiness on feet: Secondary | ICD-10-CM | POA: Diagnosis not present

## 2021-06-11 DIAGNOSIS — M6281 Muscle weakness (generalized): Secondary | ICD-10-CM | POA: Diagnosis not present

## 2021-06-11 DIAGNOSIS — R2689 Other abnormalities of gait and mobility: Secondary | ICD-10-CM | POA: Diagnosis not present

## 2021-06-11 NOTE — Therapy (Signed)
[facility] [address] [place], [place], [address] Phone: [phone]   Fax:  [phone]  Physical Therapy Treatment  Patient Details  Name: [name] MRN: [id number] Date of Birth: Oct 13, 1945 Referring Provider (PT): Andria Frames Jamal Collin, MD; [name], MD   Encounter Date: 06/11/2021   PT End of Session - 06/11/21 1558     Visit Number 3    Number of Visits 17    Date for PT Re-Evaluation 07/24/21    Authorization Type [employer]    Progress Note Due on Visit 10    PT Start Time 1403    PT Stop Time 1443    PT Time Calculation (min) 40 min    Equipment Utilized During Treatment Gait belt    Activity Tolerance Patient tolerated treatment well    Behavior During Therapy [facility] for tasks assessed/performed             Past Medical History:  Diagnosis Date   BPH (benign prostatic hyperplasia)    COPD (chronic obstructive pulmonary disease) ([place]) 2021   Coronary artery disease    a. s/p PCI to LCx in 2007; b. MV 2017 no sig ischemia, EF 59%, nl study. c. Cath 01/2020 without significant recurrent dz.   Depression    GERD (gastroesophageal reflux disease)    Hyperlipidemia    Hypertension    IBS (irritable bowel syndrome)    Myocardial infarction ([place])    12-13 yrs ago    PAF (paroxysmal atrial fibrillation) ([place])    a. CHADS2VASc => 5 (HTN, age x 1, stroke x 2, vascular disease); b. on Coumadin    PNA (pneumonia)    Skin cancer    face    Sleep apnea    off cpap    Stroke ([place])    a. x 2 with residual left-sided weakness    Past Surgical History:  Procedure Laterality Date   [place]  2006   X1 STENT   COLONOSCOPY     CORONARY ANGIOPLASTY     POLYPECTOMY     RIGHT/LEFT HEART CATH AND CORONARY ANGIOGRAPHY N/A 01/25/2020   Procedure: RIGHT/LEFT HEART CATH AND CORONARY ANGIOGRAPHY;  Surgeon: Minna Merritts, MD;  Location: [facility];  Service: Cardiovascular;  Laterality: N/A;   SKIN CANCER EXCISION N/A 08/2016   [place].  [facility] (Dr. Deliah Boston)   THYROIDECTOMY, PARTIAL     UPPER GASTROINTESTINAL ENDOSCOPY     VASCULAR SURGERY     varicose vein stripping left leg    Vitals:   06/11/21 1410  BP: (!) 150/86  Pulse: (!) 59     Subjective Assessment - 06/11/21 1405     Subjective Has 2 falls since he was last here. The first one he was transferring to the toilet, 2nd fall he went to get up and get his phone, did not have his AD. Has some bruises. Didn't hurt his head. Did the exercises for a week. Reports has been feeling weaker, getting more pain in the legs during the daytime. Messaged Dr. Carles Collet about his falls and has started Carbidopa Levodopa. Has been using his w/c for mobility around the house vs. his RW.    Patient is accompained by: Family member    Pertinent History BPH, COPD, CAD, depression, HLD, HTN, IBS, MI, PAF, OSA, skin CA, CVA with L sided residual weakness, coronary angioplasty    Limitations Walking;Standing    Patient  Stated Goals To be able to walk with a cane; to improve balance - pt falls backwards    Currently in Pain? No/denies                               [facility] Adult PT Treatment/Exercise - 06/11/21 1425       Transfers   Transfers Sit to Stand;Stand to [employer] Transfers    Sit to Stand 5: Supervision;4: Min guard    Sit to Stand Details (indicate cue type and reason) From transport chair and from mat table, cues to scoot out towards edge, wider BOS, and nose over toes for incr forwards weight shift.    Stand to Sit 5: Supervision    Stand to Sit Details Cued to back up all the way to mat table before sitting down    Stand Pivot Transfers 5: Supervision    Stand Pivot Transfer Details (indicate cue type and reason) With RW, needs cues to turn all the way around prior to sitting.    Number of Reps 2 sets;Other reps (comment)   5 reps      Ambulation/Gait   Ambulation/Gait Yes    Ambulation/Gait Assistance 5: Supervision;4: Min guard    Ambulation/Gait Assistance Details Cues for step through pattern and incr step length, esp with RLE due to decr stance time on LLE. Cues for wider BOS, esp when going around curves in the gym. Pt with tendency to lean towards the R    Ambulation Distance (Feet) 115 Feet    Assistive device Rolling walker    Gait Pattern Step-through pattern;Decreased step length - left;Decreased dorsiflexion - left;Decreased trunk rotation;Poor foot clearance - left;Decreased stance time - left;Decreased dorsiflexion - right;Decreased weight shift to left;Shuffle;Narrow base of support    Ambulation Surface Level;Indoor      Neuro Re-ed    Neuro Re-ed Details  At RW: forward stepping to targets x10 reps each side beginning with UE support > none with min guard, slternating SLS taps to 6" step x10 reps each side with fingertip support. Holding onto chair: lateral stepping strategy x10 reps each side and progressed to stepping over yardstick on each side for improved foot clearance, staggered stance weight shifting x12 reps each side with verbal/demo cues for proper technique.      Exercises   Exercises Other Exercises    Other Exercises  Pt reporting incr tightness behind legs during gait; performed seated hamstring stretch 2 x 30 seconds each side with cues for technique. Pt reporting relief from performing stretch, educated on performing at home.                       PT Short Term Goals - 05/26/21 1635       PT SHORT TERM GOAL #1   Title Pt will demonstrate ability to perform initial HEP safely with supervision.    Time 4    Period Weeks    Status New    Target Date 06/24/21      PT SHORT TERM GOAL #2   Title Pt will demonstrate ability to perform sit <> stand from variety of surfaces with consistent supervision without posterior LOB    Time 4    Period Weeks    Status New    Target  Date 06/24/21      PT SHORT TERM GOAL #3   Title Pt will improve Mini-Best Test by 4  points to indicate decreased falls risk    Baseline 13    Time 4    Period Weeks    Status New    Target Date 06/24/21      PT SHORT TERM GOAL #4   Title Pt will demonstrate <100% difference between TUG and COG TUG to indicate improved safety/balance during dual tasking    Baseline 120% difference    Time 4    Period Weeks    Status New    Target Date 06/24/21      PT SHORT TERM GOAL #5   Title Patient will negotiate 12 stairs with one rail and cane safely with supervision    Baseline Min A for safety    Time 4    Period Weeks    Status New    Target Date 06/24/21      Additional Short Term Goals   Additional Short Term Goals Yes      PT SHORT TERM GOAL #6   Title Pt will improve gait speed with RW vs. LRAD to at least 1.3 Ft/sec in order to demo decr fall risk/improved community mobility.    Baseline .93 FT/SEC with RW    Time 4    Period Weeks    Status New               PT Long Term Goals - 05/26/21 1633       PT LONG TERM GOAL #1   Title Pt will demonstrate ability to perform final HEP with supervision    Time 8    Period Weeks    Status New    Target Date 07/24/21      PT LONG TERM GOAL #2   Title Pt will demonstrate improvement in Mini-Best Test to >/= 20 points to indicate decreased falls risk    Time 8    Period Weeks    Status New    Target Date 07/24/21      PT LONG TERM GOAL #3   Title Pt will demonstrate WFL TUG and will demonstrate <80% difference between TUG and COG TUG to indicate improved safety during dual task    Time 8    Period Weeks    Status New    Target Date 07/24/21      PT LONG TERM GOAL #4   Title Pt will ambulate 230' over level surfaces without AD with supervision    Baseline ambulates with cane    Time 8    Period Weeks    Status New    Target Date 07/24/21      PT LONG TERM GOAL #5   Title Pt will consistently perform sit <>  stand transfers MOD I without posterior LOB    Time 8    Period Weeks    Status New    Target Date 07/24/21      Additional Long Term Goals   Additional Long Term Goals Yes      PT LONG TERM GOAL #6   Title Pt will improve gait speed with RW vs. LRAD to at least 2.0 ft/sec in order to demo decr fall risk/improved community mobility.    Baseline .93 ft/sec with RW    Time 8    Period Weeks    Status New                   Plan - 06/11/21 1600     Clinical Impression Statement Reviewed sit <> stand transfers  with cues for proper technique for incr forward weight side and wider BOS. Worked on Personnel officer today with RW with cues for incr step length and widening BOS, as pt with more narrow BOS esp going around curves with pt having a tendency to lean towards the R. Remainder of session focused on standing balance strategies for stepping and weight shifting. Pt tolerated session well, needing intermittent rest breaks. Will continue to progress towards LTGs.    Personal Factors and Comorbidities Comorbidity 3+;Past/Current Experience;Time since onset of injury/illness/exacerbation    Comorbidities BPH, COPD, CAD, depression, HLD, HTN, IBS, MI, PAF, OSA, skin CA, CVA with L sided residual weakness, coronary angioplasty, falls, movement disorder - diagnosis undetermined    Examination-Activity Limitations Locomotion Level;Stairs;Stand;Transfers    Examination-Participation Restrictions Community Activity    Stability/Clinical Decision Making Evolving/Moderate complexity    Rehab Potential Good    PT Frequency 2x / week    PT Duration 8 weeks    PT Treatment/Interventions ADLs/Self Care Home Management;DME Instruction;Gait training;Stair training;Functional mobility training;Therapeutic activities;Therapeutic exercise;Balance training;Neuromuscular re-education;Orthotic Fit/Training;Patient/family education;Passive range of motion    PT Next Visit Plan gait with RW, try with cane when  pt is appropriate. Stair Therapist, nutritional.  Balance reaction training-posterior LOB. BLE strengthening. SciFit/NuStep. Foot up brace for LLE when fatigued??    Consulted and Agree with Plan of Care Patient             Patient will benefit from skilled therapeutic intervention in order to improve the following deficits and impairments:  Abnormal gait, Decreased balance, Decreased strength, Difficulty walking, Pain  Visit Diagnosis: Repeated falls  Other abnormalities of gait and mobility  Muscle weakness (generalized)  Unsteadiness on feet     Problem List Patient Active Problem List   Diagnosis Date Noted   Insect bite 04/15/2021   Drug-induced tremor 03/20/2021   Depression, recurrent ([place]) 01/18/2021   Urinary frequency 01/18/2021   Partial small bowel obstruction ([place]) 01/13/2021   Thyroid nodule 12/23/2020   Suspected Movement disorder- possibly PD  12/22/2020   Hematoma- Paraspinal from Fall  12/22/2020   Frequent falls 11/13/2020   Orthostatic dizziness 08/28/2020   Statin myopathy 06/07/2019   Chronic obstructive pulmonary disease ([place]) 06/13/2018   Sleep apnea 09/16/2017   GERD (gastroesophageal reflux disease) 02/08/2015   IBS (irritable bowel syndrome) 02/08/2015   Essential hypertension 02/08/2015   AF (paroxysmal atrial fibrillation) ([place]) 01/23/2015   CAD (coronary artery disease) 01/23/2015   History of CVA (cerebrovascular accident) 01/23/2015    Arliss Journey, PT, DPT  06/11/2021, 4:02 PM  [facility] [address] [place], [place], [address] Phone: [phone]   Fax:  [phone]  Name: Aahan Marques MRN: [id number] Date of Birth: October 04, 1945

## 2021-06-13 ENCOUNTER — Encounter: Payer: Self-pay | Admitting: Family Medicine

## 2021-06-13 ENCOUNTER — Ambulatory Visit (INDEPENDENT_AMBULATORY_CARE_PROVIDER_SITE_OTHER): Payer: PPO | Admitting: Family Medicine

## 2021-06-13 ENCOUNTER — Other Ambulatory Visit: Payer: Self-pay

## 2021-06-13 VITALS — BP 154/85 | HR 56 | Ht 72.0 in | Wt 181.0 lb

## 2021-06-13 DIAGNOSIS — R296 Repeated falls: Secondary | ICD-10-CM | POA: Diagnosis not present

## 2021-06-13 DIAGNOSIS — F339 Major depressive disorder, recurrent, unspecified: Secondary | ICD-10-CM

## 2021-06-13 NOTE — Progress Notes (Signed)
° ° °  SUBJECTIVE:   CHIEF COMPLAINT / HPI:   Depression Follow-Up Patient with longstanding hx of depression, which has been worse over the past year or so secondary to his declining health and loss of independence (currently wheelchair bound) Previously on Cymbalta 40mg  daily for a long time. Switched to Citalopram in November 2022. Initially started on 20mg  and then increased to 40mg  approximately 4 weeks ago. Finds it helpful but still endorses depressed mood and passive SI. No plan or intent. Not interested in counseling (too burdensome with his mobility limitations and multiple doctors appointments).  Falls Patient with 4 falls over the past 1 month. January 2nd was the most recent. Falls occur when he first stands up, related to losing his balance. Has never hit his head with any of the falls. Has not sustained any injuries or needed to seek medical attention. The falls started after stopping his Carbidopa-Levodopa. He also happened to be out of physical therapy around the time the falls were occurring. Restarted Sinemet 3 days ago and is now in PT again. Follows closely with neurology- they are aware of all this and he has upcoming appt on 07/02/21.  PERTINENT  PMH / PSH: unspecified movement disorder, HTN, a-fib, CAD, hx CVA  OBJECTIVE:   BP (!) 154/85    Pulse (!) 56    Ht 6' (1.829 m)    Wt 181 lb (82.1 kg)    SpO2 97%    BMI 24.55 kg/m   General: NAD, pleasant, able to participate in exam Cardiac: RRR, S1 S2 present. normal heart sounds Respiratory: CTAB, normal effort, No wheezes, rales or rhonchi Extremities: no edema or cyanosis. Skin: warm and dry, no rashes noted Neuro: alert, CN II-XII intact, 5/5 strength throughout bilateral upper and lower extremities, shuffling gait with minimal lifting of feet, mildly unsteady, unable to stand on 1 leg, sensation intact to light touch throughout Psych: Normal affect and mood   ASSESSMENT/PLAN:   Depression, recurrent (HCC) Mildly  improved on Citalopram 40mg  daily. PHQ-9 score of 13 today, with positive response to question #9. Patient with longstanding passive SI, related to his medical problems and feeling burdensome. No plan or intent. -Continue Citalopram 40mg  daily -Consider addition of Mirtazapine at next appointment -Patient declines counseling  Frequent falls Patient with underlying movement disorder, for which he follows closely with neurology.  4 recent falls in the setting of discontinuing his Carbidopa-Levodopa and a brief hiatus from physical therapy. Did not sustain any injuries. -Follow up with neurology -Has resumed Sinemet 25-100mg  TID -Continue physical therapy -ED precautions reviewed in the event of another fall     William Dad, MD [place]

## 2021-06-13 NOTE — Patient Instructions (Signed)
As always, it was a pleasure seeing you.  -Continue the Citalopram (Celexa) 40mg  daily. We will consider starting an additional medication (Mirtazapine) if needed in another 6 weeks or so.  -We will obtain a thyroid ultrasound at some point when I return from maternity leave.  -I will be on the lookout for the records from Dr. Carles Collet. Please reach out if you're having continued falls despite the physical therapy and Carbidopa-Levodopa.   Take care and seek immediate care sooner if you develop any concerns.  Dr[facility]

## 2021-06-14 NOTE — Assessment & Plan Note (Signed)
Mildly improved on Citalopram 40mg  daily. PHQ-9 score of 13 today, with positive response to question #9. Patient with longstanding passive SI, related to his medical problems and feeling burdensome. No plan or intent. -Continue Citalopram 40mg  daily -Consider addition of Mirtazapine at next appointment -Patient declines counseling

## 2021-06-14 NOTE — Assessment & Plan Note (Signed)
Patient with underlying movement disorder, for which he follows closely with neurology.  4 recent falls in the setting of discontinuing his Carbidopa-Levodopa and a brief hiatus from physical therapy. Did not sustain any injuries. -Follow up with neurology -Has resumed Sinemet 25-100mg  TID -Continue physical therapy -ED precautions reviewed in the event of another fall

## 2021-06-18 ENCOUNTER — Encounter: Payer: Self-pay | Admitting: Physical Therapy

## 2021-06-18 ENCOUNTER — Ambulatory Visit: Payer: PPO | Admitting: Physical Therapy

## 2021-06-18 ENCOUNTER — Other Ambulatory Visit: Payer: Self-pay

## 2021-06-18 VITALS — BP 172/92 | HR 56

## 2021-06-18 DIAGNOSIS — M6281 Muscle weakness (generalized): Secondary | ICD-10-CM

## 2021-06-18 DIAGNOSIS — R2681 Unsteadiness on feet: Secondary | ICD-10-CM

## 2021-06-18 DIAGNOSIS — R2689 Other abnormalities of gait and mobility: Secondary | ICD-10-CM

## 2021-06-18 DIAGNOSIS — R296 Repeated falls: Secondary | ICD-10-CM

## 2021-06-18 NOTE — Therapy (Addendum)
[facility] [address] [place], [place], [address] Phone: [phone]   Fax:  [phone]  Physical Therapy Treatment  Patient Details  Name: [name] MRN: [id number] Date of Birth: February 01, 1946 Referring Provider (PT): Andria Frames Jamal Collin, MD; [name], MD   Encounter Date: 06/18/2021   PT End of Session - 06/18/21 1408     Visit Number 4    Number of Visits 17    Date for PT Re-Evaluation 07/24/21    Authorization Type [employer]    Progress Note Due on Visit 10    PT Start Time 0867    PT Stop Time 6195    PT Time Calculation (min) 40 min    Equipment Utilized During Treatment Gait belt    Activity Tolerance Patient tolerated treatment well    Behavior During Therapy [facility] for tasks assessed/performed             Past Medical History:  Diagnosis Date   BPH (benign prostatic hyperplasia)    COPD (chronic obstructive pulmonary disease) ([place]) 2021   Coronary artery disease    a. s/p PCI to LCx in 2007; b. MV 2017 no sig ischemia, EF 59%, nl study. c. Cath 01/2020 without significant recurrent dz.   Depression    GERD (gastroesophageal reflux disease)    Hyperlipidemia    Hypertension    IBS (irritable bowel syndrome)    Myocardial infarction ([place])    12-13 yrs ago    PAF (paroxysmal atrial fibrillation) ([place])    a. CHADS2VASc => 5 (HTN, age x 1, stroke x 2, vascular disease); b. on Coumadin    PNA (pneumonia)    Skin cancer    face    Sleep apnea    off cpap    Stroke ([place])    a. x 2 with residual left-sided weakness    Past Surgical History:  Procedure Laterality Date   [place]  2006   X1 STENT   COLONOSCOPY     CORONARY ANGIOPLASTY     POLYPECTOMY     RIGHT/LEFT HEART CATH AND CORONARY ANGIOGRAPHY N/A 01/25/2020   Procedure: RIGHT/LEFT HEART CATH AND CORONARY ANGIOGRAPHY;  Surgeon: Minna Merritts, MD;  Location: [facility];  Service: Cardiovascular;  Laterality: N/A;   SKIN CANCER EXCISION N/A 08/2016   [place].  [facility] (Dr. Deliah Boston)   THYROIDECTOMY, PARTIAL     UPPER GASTROINTESTINAL ENDOSCOPY     VASCULAR SURGERY     varicose vein stripping left leg    Vitals:   06/18/21 1428 06/18/21 1434  BP: (!) 173/98 (!) 172/92  Pulse: (!) 58 (!) 56     Subjective Assessment - 06/18/21 1404     Subjective No falls since he was last here. Leg pain has been progressively getting worse and having more in the daytime. Having to take more tylenol for pain and it didn't help last night. Did his exercises on Saturday, the rest of the time he was too exhausted.    Patient is accompained by: Family member    Pertinent History BPH, COPD, CAD, depression, HLD, HTN, IBS, MI, PAF, OSA, skin CA, CVA with L sided residual weakness, coronary angioplasty    Limitations Walking;Standing    Patient Stated Goals To be able to walk with a cane; to improve balance - pt falls backwards    Currently in Pain? Yes    Pain Score 3  Pain Location --   thighs   Pain Orientation Right;Left    Pain Descriptors / Indicators Sore    Pain Type Acute pain    Aggravating Factors  After doing exercises    Pain Relieving Factors Sitting and putting legs up, taking tylenol                               [facility] Adult PT Treatment/Exercise - 06/18/21 1424       Transfers   Transfers Sit to Stand;Stand to [employer] Transfers    Sit to Stand 5: Supervision;4: Min guard    Sit to Stand Details (indicate cue type and reason) From transport chair and mat table, cues for incr foward lean to stand    Stand to Sit 5: Supervision    Stand Pivot Transfers 5: Supervision    Stand Pivot Transfer Details (indicate cue type and reason) With RW, needs cues to turn all the way around prior to sitting.      Ambulation/Gait   Ambulation/Gait Yes    Ambulation/Gait Assistance 5: Supervision     Ambulation/Gait Assistance Details Cues for step length bilat    Ambulation Distance (Feet) 50 Feet   x2   Assistive device Rolling walker    Gait Pattern Step-through pattern;Decreased step length - left;Decreased dorsiflexion - left;Decreased trunk rotation;Poor foot clearance - left;Decreased stance time - left;Decreased dorsiflexion - right;Decreased weight shift to left;Shuffle;Narrow base of support    Ambulation Surface Level;Indoor      Exercises   Exercises Other Exercises    Other Exercises  SciFit with BLE/BUE for strengthening, endurance at gear 1.3 for 5 minutes. Intermittent periods where pt just using legs over his arms                 Balance Exercises - 06/18/21 1445       Balance Exercises: Standing   Stepping Strategy Posterior;UE support;10 reps;Limitations    Stepping Strategy Limitations In // bars - cues for technique and weight shift    Heel Raises 10 reps;Both   in // bars          Access Code: [email or web address] URL: [email or web address] Date: 06/18/2021 Prepared by: Janann August   Exercises Supine Bridge - 2 x daily - 5 x weekly - 1-2 sets - 10 reps Sit to Stand with Counter Support - 1-2 x daily - 5 x weekly - 2 sets - 5 reps Mini Squat with Counter Support - 2 x daily - 5 x weekly - 1 sets - 10 reps Heel Toe Raises with Counter Support - 1 x daily - 5 x weekly - 1-2 sets - 10 reps Lateral Weight Shift - 1 x daily - 5 x weekly - 2 sets - 10 reps  New additions to pt's HEP on 06/18/21:  Educated that pt can perform these exercises in sitting on days where he might feel too tired to do standing. Continued to educate on importance of standing activities as well.   Seated Long Arc Quad - 1-2 x daily - 5 x weekly - 2 sets - 10 reps - needs cues for isometric hold  Seated March - 1-2 x daily - 5 x weekly - 2 sets - 10 reps Seated Hip Abduction with Resistance - 1-2 x daily - 5 x weekly - 2 sets - 10 reps - with use of green tband around  thighs  PT Short Term Goals - 05/26/21 1635       PT SHORT TERM GOAL #1   Title Pt will demonstrate ability to perform initial HEP safely with supervision.    Time 4    Period Weeks    Status New    Target Date 06/24/21      PT SHORT TERM GOAL #2   Title Pt will demonstrate ability to perform sit <> stand from variety of surfaces with consistent supervision without posterior LOB    Time 4    Period Weeks    Status New    Target Date 06/24/21      PT SHORT TERM GOAL #3   Title Pt will improve Mini-Best Test by 4 points to indicate decreased falls risk    Baseline 13    Time 4    Period Weeks    Status New    Target Date 06/24/21      PT SHORT TERM GOAL #4   Title Pt will demonstrate <100% difference between TUG and COG TUG to indicate improved safety/balance during dual tasking    Baseline 120% difference    Time 4    Period Weeks    Status New    Target Date 06/24/21      PT SHORT TERM GOAL #5   Title Patient will negotiate 12 stairs with one rail and cane safely with supervision    Baseline Min A for safety    Time 4    Period Weeks    Status New    Target Date 06/24/21      Additional Short Term Goals   Additional Short Term Goals Yes      PT SHORT TERM GOAL #6   Title Pt will improve gait speed with RW vs. LRAD to at least 1.3 Ft/sec in order to demo decr fall risk/improved community mobility.    Baseline .93 FT/SEC with RW    Time 4    Period Weeks    Status New               PT Long Term Goals - 05/26/21 1633       PT LONG TERM GOAL #1   Title Pt will demonstrate ability to perform final HEP with supervision    Time 8    Period Weeks    Status New    Target Date 07/24/21      PT LONG TERM GOAL #2   Title Pt will demonstrate improvement in Mini-Best Test to >/= 20 points to indicate decreased falls risk    Time 8    Period Weeks    Status New    Target Date 07/24/21      PT LONG TERM GOAL #3   Title Pt will demonstrate WFL  TUG and will demonstrate <80% difference between TUG and COG TUG to indicate improved safety during dual task    Time 8    Period Weeks    Status New    Target Date 07/24/21      PT LONG TERM GOAL #4   Title Pt will ambulate 230' over level surfaces without AD with supervision    Baseline ambulates with cane    Time 8    Period Weeks    Status New    Target Date 07/24/21      PT LONG TERM GOAL #5   Title Pt will consistently perform sit <> stand transfers MOD I without posterior LOB    Time  8    Period Weeks    Status New    Target Date 07/24/21      Additional Long Term Goals   Additional Long Term Goals Yes      PT LONG TERM GOAL #6   Title Pt will improve gait speed with RW vs. LRAD to at least 2.0 ft/sec in order to demo decr fall risk/improved community mobility.    Baseline .93 ft/sec with RW    Time 8    Period Weeks    Status New                   Plan - 06/19/21 0739     Clinical Impression Statement Added seated exercises to pt's HEP for pt to perform on days where he is more fatigued, but still performing his standing ones at home at sink with wife supervision. Trialed the SciFit today for strengthening/endurance. BP elevated afterwards at 173/98, but did decr with seated rest break. Will continue to progress towards LTGs.    Personal Factors and Comorbidities Comorbidity 3+;Past/Current Experience;Time since onset of injury/illness/exacerbation    Comorbidities BPH, COPD, CAD, depression, HLD, HTN, IBS, MI, PAF, OSA, skin CA, CVA with L sided residual weakness, coronary angioplasty, falls, movement disorder - diagnosis undetermined    Examination-Activity Limitations Locomotion Level;Stairs;Stand;Transfers    Examination-Participation Restrictions Community Activity    Stability/Clinical Decision Making Evolving/Moderate complexity    Rehab Potential Good    PT Frequency 2x / week    PT Duration 8 weeks    PT Treatment/Interventions ADLs/Self Care Home  Management;DME Instruction;Gait training;Stair training;Functional mobility training;Therapeutic activities;Therapeutic exercise;Balance training;Neuromuscular re-education;Orthotic Fit/Training;Patient/family education;Passive range of motion    PT Next Visit Plan monitor BP. gait with RW/endurance. Stair Therapist, nutritional.  Balance reaction training-posterior LOB. BLE strengthening. SciFit/NuStep. Foot up brace for LLE when fatigued??    PT Home Exercise Plan [email or web address]    Consulted and Agree with Plan of Care Patient             Patient will benefit from skilled therapeutic intervention in order to improve the following deficits and impairments:  Abnormal gait, Decreased balance, Decreased strength, Difficulty walking, Pain  Visit Diagnosis: Repeated falls  Muscle weakness (generalized)  Other abnormalities of gait and mobility  Unsteadiness on feet     Problem List Patient Active Problem List   Diagnosis Date Noted   Insect bite 04/15/2021   Drug-induced tremor 03/20/2021   Depression, recurrent ([place]) 01/18/2021   Urinary frequency 01/18/2021   Partial small bowel obstruction ([place]) 01/13/2021   Thyroid nodule 12/23/2020   Suspected Movement disorder- possibly PD  12/22/2020   Hematoma- Paraspinal from Fall  12/22/2020   Frequent falls 11/13/2020   Orthostatic dizziness 08/28/2020   Statin myopathy 06/07/2019   Chronic obstructive pulmonary disease ([place]) 06/13/2018   Sleep apnea 09/16/2017   GERD (gastroesophageal reflux disease) 02/08/2015   IBS (irritable bowel syndrome) 02/08/2015   Essential hypertension 02/08/2015   AF (paroxysmal atrial fibrillation) ([place]) 01/23/2015   CAD (coronary artery disease) 01/23/2015   History of CVA (cerebrovascular accident) 01/23/2015    Arliss Journey, PT, DPT  06/19/2021, 7:42 AM  [facility] [address] [place], [place], [address] Phone: [phone]   Fax:   [phone]  Name: Demarrius Guerrero MRN: [id number] Date of Birth: 08-May-1946

## 2021-06-18 NOTE — Patient Instructions (Signed)
Access Code: [email or web address] URL: [email or web address] Date: 06/18/2021 Prepared by: Janann August  Exercises Supine Bridge - 2 x daily - 5 x weekly - 1-2 sets - 10 reps Sit to Stand with Counter Support - 1-2 x daily - 5 x weekly - 2 sets - 5 reps Mini Squat with Counter Support - 2 x daily - 5 x weekly - 1 sets - 10 reps Heel Toe Raises with Counter Support - 1 x daily - 5 x weekly - 1-2 sets - 10 reps Lateral Weight Shift - 1 x daily - 5 x weekly - 2 sets - 10 reps Seated Long Arc Quad - 1-2 x daily - 5 x weekly - 2 sets - 10 reps Seated March - 1-2 x daily - 5 x weekly - 2 sets - 10 reps Seated Hip Abduction with Resistance - 1-2 x daily - 5 x weekly - 2 sets - 10 reps

## 2021-06-20 ENCOUNTER — Other Ambulatory Visit: Payer: Self-pay

## 2021-06-20 ENCOUNTER — Telehealth: Payer: Self-pay | Admitting: Interventional Cardiology

## 2021-06-20 ENCOUNTER — Encounter: Payer: Self-pay | Admitting: Physical Therapy

## 2021-06-20 ENCOUNTER — Ambulatory Visit: Payer: PPO | Admitting: Physical Therapy

## 2021-06-20 VITALS — BP 176/99 | HR 56

## 2021-06-20 DIAGNOSIS — R296 Repeated falls: Secondary | ICD-10-CM

## 2021-06-20 DIAGNOSIS — M6281 Muscle weakness (generalized): Secondary | ICD-10-CM

## 2021-06-20 DIAGNOSIS — R2689 Other abnormalities of gait and mobility: Secondary | ICD-10-CM

## 2021-06-20 DIAGNOSIS — R2681 Unsteadiness on feet: Secondary | ICD-10-CM

## 2021-06-20 MED ORDER — AMLODIPINE BESYLATE 2.5 MG PO TABS: 2.5000 mg | ORAL_TABLET | Freq: Every day | ORAL | 3 refills | Status: DC

## 2021-06-20 NOTE — Telephone Encounter (Signed)
Can start amlodipine 2.5 mg daily.

## 2021-06-20 NOTE — Telephone Encounter (Signed)
Patient notified.  Prescription sent to [facility]

## 2021-06-20 NOTE — Telephone Encounter (Signed)
I spoke with patient. He reports the first reading below was taken on 1/11 Other two were today.  Last was by physical therapist after therapy. He reports other readings that he checked at home--160/102 -3 weeks ago. 184/117 in the beginning of November.  He reports all other readings he has checked are 150-160 or above. He is taking Lisinopril 40 mg daily in the morning. Is taking bisoprolol 10 mg daily in the morning. Watches salt intake.  Recently diagnosed with Parkinson's

## 2021-06-20 NOTE — Telephone Encounter (Signed)
Pt c/o BP issue: STAT if pt c/o blurred vision, one-sided weakness or slurred speech  1. What are your last 5 BP readings?  154/90 173/98 176/99 All of these BP's are after pt had exercised   2. Are you having any other symptoms (ex. Dizziness, headache, blurred vision, passed out)? NO  3. What is your BP issue? PT SAY'S HIS BP HAS BEEN HIGH FOR 2 MONTHS

## 2021-06-20 NOTE — Therapy (Addendum)
[facility] [address] [place], [place], [address] Phone: [phone]   Fax:  [phone]  Physical Therapy Treatment  Patient Details  Name: [name] MRN: [id number] Date of Birth: [birth year] Referring Provider (PT): Andria Frames Jamal Collin, MD; [name], MD   Encounter Date: 06/20/2021   PT End of Session - 06/20/21 1411     Visit Number 5    Number of Visits 17    Date for PT Re-Evaluation 07/24/21    Authorization Type [employer]    Progress Note Due on Visit 10    PT Start Time 1407   pt in bathroom at start of session   PT Stop Time 1446    PT Time Calculation (min) 39 min    Equipment Utilized During Treatment Gait belt    Activity Tolerance Patient tolerated treatment well    Behavior During Therapy [facility] for tasks assessed/performed             Past Medical History:  Diagnosis Date   BPH (benign prostatic hyperplasia)    COPD (chronic obstructive pulmonary disease) ([place]) 2021   Coronary artery disease    a. s/p PCI to LCx in 2007; b. MV 2017 no sig ischemia, EF 59%, nl study. c. Cath 01/2020 without significant recurrent dz.   Depression    GERD (gastroesophageal reflux disease)    Hyperlipidemia    Hypertension    IBS (irritable bowel syndrome)    Myocardial infarction ([place])    12-13 yrs ago    PAF (paroxysmal atrial fibrillation) ([place])    a. CHADS2VASc => 5 (HTN, age x 1, stroke x 2, vascular disease); b. on Coumadin    PNA (pneumonia)    Skin cancer    face    Sleep apnea    off cpap    Stroke ([place])    a. x 2 with residual left-sided weakness    Past Surgical History:  Procedure Laterality Date   [place]  2006   X1 STENT   COLONOSCOPY     CORONARY ANGIOPLASTY     POLYPECTOMY     RIGHT/LEFT HEART CATH AND CORONARY ANGIOGRAPHY N/A 01/25/2020   Procedure: RIGHT/LEFT HEART CATH AND CORONARY ANGIOGRAPHY;  Surgeon: Minna Merritts, MD;  Location: [facility];  Service: Cardiovascular;  Laterality: N/A;   SKIN CANCER EXCISION N/A 08/2016   [place].  [facility] (Dr. Deliah Boston)   THYROIDECTOMY, PARTIAL     UPPER GASTROINTESTINAL ENDOSCOPY     VASCULAR SURGERY     varicose vein stripping left leg    Vitals:   06/20/21 1413 06/20/21 1438  BP: (!) 154/90 (!) 176/99  Pulse: (!) 56 (!) 56     Subjective Assessment - 06/20/21 1408     Subjective Wasn't able to do his exercises yesterday because he was too tired. Per pt's wife, he has been doing better at getting up on his own. Has not been using his w/c at home, using his RW for gait. No falls.    Patient is accompained by: Family member    Pertinent History BPH, COPD, CAD, depression, HLD, HTN, IBS, MI, PAF, OSA, skin CA, CVA with L sided residual weakness, coronary angioplasty    Limitations Walking;Standing    Patient Stated Goals To be able to walk with a cane; to improve balance - pt falls backwards    Currently in Pain? No/denies   "just feels a  little tightness in his thighs"                              [facility] Adult PT Treatment/Exercise - 06/20/21 1540       Transfers   Transfers Sit to Stand;Stand to [employer] Transfers    Stand to Sit 5: Supervision    Stand to Sit Details Cued to back up all the way to transport chair before sitting down      Ambulation/Gait   Ambulation/Gait Yes    Ambulation/Gait Assistance 5: Supervision    Ambulation/Gait Assistance Details Cues for step length bilat. Pt with decr weight shift anteriorly on toes during gait.    Ambulation Distance (Feet) 50 Feet   x2   Assistive device Rolling walker    Gait Pattern Step-through pattern;Decreased step length - left;Decreased dorsiflexion - left;Decreased trunk rotation;Poor foot clearance - left;Decreased stance time - left;Decreased dorsiflexion - right;Decreased weight shift to left;Shuffle;Narrow base of support     Ambulation Surface Level;Indoor      Therapeutic Activites    Therapeutic Activities Other Therapeutic Activities    Other Therapeutic Activities Pt with elevated BP at rest at start of session (still WFL to participate). When asked if pt has seen a cardiologist, pt has not seen in the past year. Educated on following up with cardiologist and making an appt as pt also with elevated BP (esp after activity) last session. BP checked again after activity today and incr with high diastolic value at 99. Pt asymptomatic. Educated to monitor BP at home and to log BP values. PT also wrote down pt's BP values from therapy to let cardiology know as well. Educated on BP limitations to therapy                 Balance Exercises - 06/20/21 1437       Balance Exercises: Standing   Standing Eyes Opened Narrow base of support (BOS);Limitations    Standing Eyes Opened Limitations Feet together x30 seconds, head turns x10 reps, head nods x 10 reps (needing to have slight space between feet when attempted with feet together pt with posterior loss of balance each time when looking up). Min guard/min A for balance    SLS with Vectors Solid surface;Limitations    SLS with Vectors Limitations Alternating step taps to 6" step x12 reps each leg beginning with UE support > none, needning min guard/min A due to posterior LOB    Standing, One Foot on a Step Eyes open;6 inch;Limitations    Standing, One Foot on a Step Limitations x3 reps each leg holding for 10-15 seconds, cues for posture/weight shift with min A as needed for balance trying without UE support, pt losing balance posteriorly, an additional x1 rep each side with weight shifting forwards and then back to middle    Wall Bumps Hip;Eyes opened;20 reps;Limitations    Wall Bumps Limitations performed in // bars with UE support and performing some reps without, needing verbal/tactile/demo cues for proper technique and hinging from hips    Stepping Strategy  Posterior;UE support;10 reps;Limitations    Stepping Strategy Limitations In // bars - cues for technique and weight shift                PT Education - 06/20/21 1449     Education Details See therapeutic activity section    Person(s) Educated Patient;Spouse    Methods Explanation    Comprehension Verbalized  understanding              PT Short Term Goals - 05/26/21 1635       PT SHORT TERM GOAL #1   Title Pt will demonstrate ability to perform initial HEP safely with supervision.    Time 4    Period Weeks    Status New    Target Date 06/24/21      PT SHORT TERM GOAL #2   Title Pt will demonstrate ability to perform sit <> stand from variety of surfaces with consistent supervision without posterior LOB    Time 4    Period Weeks    Status New    Target Date 06/24/21      PT SHORT TERM GOAL #3   Title Pt will improve Mini-Best Test by 4 points to indicate decreased falls risk    Baseline 13    Time 4    Period Weeks    Status New    Target Date 06/24/21      PT SHORT TERM GOAL #4   Title Pt will demonstrate <100% difference between TUG and COG TUG to indicate improved safety/balance during dual tasking    Baseline 120% difference    Time 4    Period Weeks    Status New    Target Date 06/24/21      PT SHORT TERM GOAL #5   Title Patient will negotiate 12 stairs with one rail and cane safely with supervision    Baseline Min A for safety    Time 4    Period Weeks    Status New    Target Date 06/24/21      Additional Short Term Goals   Additional Short Term Goals Yes      PT SHORT TERM GOAL #6   Title Pt will improve gait speed with RW vs. LRAD to at least 1.3 Ft/sec in order to demo decr fall risk/improved community mobility.    Baseline .93 FT/SEC with RW    Time 4    Period Weeks    Status New               PT Long Term Goals - 05/26/21 1633       PT LONG TERM GOAL #1   Title Pt will demonstrate ability to perform final HEP with  supervision    Time 8    Period Weeks    Status New    Target Date 07/24/21      PT LONG TERM GOAL #2   Title Pt will demonstrate improvement in Mini-Best Test to >/= 20 points to indicate decreased falls risk    Time 8    Period Weeks    Status New    Target Date 07/24/21      PT LONG TERM GOAL #3   Title Pt will demonstrate WFL TUG and will demonstrate <80% difference between TUG and COG TUG to indicate improved safety during dual task    Time 8    Period Weeks    Status New    Target Date 07/24/21      PT LONG TERM GOAL #4   Title Pt will ambulate 230' over level surfaces without AD with supervision    Baseline ambulates with cane    Time 8    Period Weeks    Status New    Target Date 07/24/21      PT LONG TERM GOAL #5   Title Pt will consistently  perform sit <> stand transfers MOD I without posterior LOB    Time 8    Period Weeks    Status New    Target Date 07/24/21      Additional Long Term Goals   Additional Long Term Goals Yes      PT LONG TERM GOAL #6   Title Pt will improve gait speed with RW vs. LRAD to at least 2.0 ft/sec in order to demo decr fall risk/improved community mobility.    Baseline .12 ft/sec with RW    Time 8    Period Weeks    Status New                   Plan - 06/20/21 1548     Clinical Impression Statement Today's skilled session focused on standing balance in // bars working with decr UE support. Pt with posterior LOB with SLS tasks and narrow BOS balance with head motions and needing min guard/min A for balance. Pt with elevated BP after activity (176/99), with pt being asympatomic. Educated on importance on following up with cardiology regarding BP as it was also elevated at last session after activity. Will continue to progress towards LTGs.    Personal Factors and Comorbidities Comorbidity 3+;Past/Current Experience;Time since onset of injury/illness/exacerbation    Comorbidities BPH, COPD, CAD, depression, HLD, HTN, IBS,  MI, PAF, OSA, skin CA, CVA with L sided residual weakness, coronary angioplasty, falls, movement disorder - diagnosis undetermined    Examination-Activity Limitations Locomotion Level;Stairs;Stand;Transfers    Examination-Participation Restrictions Community Activity    Stability/Clinical Decision Making Evolving/Moderate complexity    Rehab Potential Good    PT Frequency 2x / week    PT Duration 8 weeks    PT Treatment/Interventions ADLs/Self Care Home Management;DME Instruction;Gait training;Stair training;Functional mobility training;Therapeutic activities;Therapeutic exercise;Balance training;Neuromuscular re-education;Orthotic Fit/Training;Patient/family education;Passive range of motion    PT Next Visit Plan check STGs this week. monitor BP. did they make an appt with cardiologist?? gait with RW/endurance. Stair Therapist, nutritional.  Balance reaction training-posterior LOB, SLS, weight shifting. BLE strengthening. SciFit/NuStep. Foot up brace for LLE when fatigued??    PT Home Exercise Plan [id number]    Consulted and Agree with Plan of Care Patient             Patient will benefit from skilled therapeutic intervention in order to improve the following deficits and impairments:  Abnormal gait, Decreased balance, Decreased strength, Difficulty walking, Pain  Visit Diagnosis: Repeated falls  Other abnormalities of gait and mobility  Muscle weakness (generalized)  Unsteadiness on feet     Problem List Patient Active Problem List   Diagnosis Date Noted   Insect bite 04/15/2021   Drug-induced tremor 03/20/2021   Depression, recurrent ([place]) 01/18/2021   Urinary frequency 01/18/2021   Partial small bowel obstruction ([place]) 01/13/2021   Thyroid nodule 12/23/2020   Suspected Movement disorder- possibly PD  12/22/2020   Hematoma- Paraspinal from Fall  12/22/2020   Frequent falls 11/13/2020   Orthostatic dizziness 08/28/2020   Statin myopathy 06/07/2019   Chronic obstructive  pulmonary disease ([place]) 06/13/2018   Sleep apnea 09/16/2017   GERD (gastroesophageal reflux disease) 02/08/2015   IBS (irritable bowel syndrome) 02/08/2015   Essential hypertension 02/08/2015   AF (paroxysmal atrial fibrillation) ([place]) 01/23/2015   CAD (coronary artery disease) 01/23/2015   History of CVA (cerebrovascular accident) 01/23/2015    Arliss Journey, PT, DPT 06/20/2021, 3:50 PM  [address]  [place], [place], [address] Phone: [phone]   Fax:  [phone]  Name: [name] MRN: [id number] Date of Birth: 1946-06-08

## 2021-06-23 ENCOUNTER — Other Ambulatory Visit: Payer: Self-pay

## 2021-06-23 ENCOUNTER — Encounter: Payer: Self-pay | Admitting: Physical Therapy

## 2021-06-23 ENCOUNTER — Ambulatory Visit: Payer: PPO | Admitting: Physical Therapy

## 2021-06-23 VITALS — BP 146/83 | HR 53

## 2021-06-23 DIAGNOSIS — R2681 Unsteadiness on feet: Secondary | ICD-10-CM

## 2021-06-23 DIAGNOSIS — R296 Repeated falls: Secondary | ICD-10-CM

## 2021-06-23 DIAGNOSIS — M6281 Muscle weakness (generalized): Secondary | ICD-10-CM

## 2021-06-23 DIAGNOSIS — R2689 Other abnormalities of gait and mobility: Secondary | ICD-10-CM

## 2021-06-23 NOTE — Therapy (Addendum)
[facility] [address] [place], [place], [address] Phone: [phone]   Fax:  [phone]  Physical Therapy Treatment  Patient Details  Name: Kennieth Plotts MRN: [id number] Date of Birth: 03-05-1946 Referring Provider (PT): Andria Frames Jamal Collin, MD; [name], MD   Encounter Date: 06/23/2021   PT End of Session - 06/23/21 1404     Visit Number 6    Number of Visits 17    Date for PT Re-Evaluation 07/24/21    Authorization Type [employer]    Progress Note Due on Visit 10    PT Start Time 1403    PT Stop Time 1443    PT Time Calculation (min) 40 min    Equipment Utilized During Treatment Gait belt    Activity Tolerance Patient tolerated treatment well    Behavior During Therapy [facility] for tasks assessed/performed             Past Medical History:  Diagnosis Date   BPH (benign prostatic hyperplasia)    COPD (chronic obstructive pulmonary disease) ([place]) 2021   Coronary artery disease    a. s/p PCI to LCx in 2007; b. MV 2017 no sig ischemia, EF 59%, nl study. c. Cath 01/2020 without significant recurrent dz.   Depression    GERD (gastroesophageal reflux disease)    Hyperlipidemia    Hypertension    IBS (irritable bowel syndrome)    Myocardial infarction ([place])    12-13 yrs ago    PAF (paroxysmal atrial fibrillation) ([place])    a. CHADS2VASc => 5 (HTN, age x 1, stroke x 2, vascular disease); b. on Coumadin    PNA (pneumonia)    Skin cancer    face    Sleep apnea    off cpap    Stroke ([place])    a. x 2 with residual left-sided weakness    Past Surgical History:  Procedure Laterality Date   [place]  2006   X1 STENT   COLONOSCOPY     CORONARY ANGIOPLASTY     POLYPECTOMY     RIGHT/LEFT HEART CATH AND CORONARY ANGIOGRAPHY N/A 01/25/2020   Procedure: RIGHT/LEFT HEART CATH AND CORONARY ANGIOGRAPHY;  Surgeon: Minna Merritts, MD;  Location: [facility] [employer];  Service: Cardiovascular;  Laterality: N/A;   SKIN CANCER EXCISION N/A 08/2016   [place].  [facility] (Dr. Deliah Boston)   THYROIDECTOMY, PARTIAL     UPPER GASTROINTESTINAL ENDOSCOPY     VASCULAR SURGERY     varicose vein stripping left leg    Vitals:   06/23/21 1408  BP: (!) 146/83  Pulse: (!) 53     Subjective Assessment - 06/23/21 1405     Subjective Reached out to the cardiologist on Friday after elevated BP from last session. Got him started on the amlodipine. No falls.    Patient is accompained by: Family member    Pertinent History BPH, COPD, CAD, depression, HLD, HTN, IBS, MI, PAF, OSA, skin CA, CVA with L sided residual weakness, coronary angioplasty    Limitations Walking;Standing    Patient Stated Goals To be able to walk with a cane; to improve balance - pt falls backwards    Currently in Pain? Yes    Pain Score 2     Pain Location --   in the thighs   Pain Orientation Left;Right    Pain Descriptors / Indicators Sore    Pain Type Acute pain  Aggravating Factors  After doing exercises    Pain Relieving Factors Resting, taking tylenol                               [facility] Adult PT Treatment/Exercise - 06/23/21 1441       Transfers   Transfers Sit to Stand;Stand to [employer] Transfers    Sit to Stand 5: Supervision;4: Min guard    Sit to Stand Details (indicate cue type and reason) from transport chair with RW    Stand to Sit 5: Supervision    Stand to Sit Details cued to back all the way up to mat/transport chair before sitting down      Ambulation/Gait   Ambulation/Gait Yes    Ambulation/Gait Assistance 5: Supervision    Ambulation/Gait Assistance Details Cues throughout for incr step length bilat, heel strike for incr foot clearance. Decr step length when going around curves. Pt with tendency to ambulate with more shuffled steps bilat. Trialed use of L foot up brace during one lap to see if it would help with LLE  foot clearance when more fatigued. PT did notice slight improvement, but pt reporting not feeling any difference    Ambulation Distance (Feet) 230 Feet   x1, 115' x1, plus additional distances   Assistive device Rolling walker    Gait Pattern Step-through pattern;Decreased step length - left;Decreased dorsiflexion - left;Decreased trunk rotation;Poor foot clearance - left;Decreased stance time - left;Decreased dorsiflexion - right;Decreased weight shift to left;Shuffle;Narrow base of support    Ambulation Surface Level;Indoor                 Balance Exercises - 06/23/21 1441       Balance Exercises: Standing   Standing Eyes Opened Narrow base of support (BOS);Limitations    Standing Eyes Opened Limitations Near stance: head turns 2 x 10 reps, head nods 2 x 10 reps - needing fingertip support and min guard/min A from therapist at times due to LOB posteriory. More challenged by head nods    Standing Eyes Closed Limitations    Standing Eyes Closed Limitations Feet hip width 2 x 30 seconds - able to perform with no UE support. Feet slightly narrower than hip width 3 x 15-20 seconds, needing min guard/min A for balance with intermittent taps to bars for balance   Retro Gait 4 reps;Limitations    Retro Gait Limitations down and back in // bars with fingertip support, tried x2 reps without UE support with pt with decr step length and almost losing balance posteriorly needing min A. Cues to perform with hip hinge forwards for counter balance and incr step length    Other Standing Exercises Weight shifting slowly onto heels and then toes x15 reps while keeping feet flat on the floor, with UE support>none, verbal/demo cues for technique                  PT Short Term Goals - 05/26/21 1635       PT SHORT TERM GOAL #1   Title Pt will demonstrate ability to perform initial HEP safely with supervision.    Time 4    Period Weeks    Status New    Target Date 06/24/21      PT SHORT TERM  GOAL #2   Title Pt will demonstrate ability to perform sit <> stand from variety of surfaces with consistent supervision without posterior LOB  Time 4    Period Weeks    Status New    Target Date 06/24/21      PT SHORT TERM GOAL #3   Title Pt will improve Mini-Best Test by 4 points to indicate decreased falls risk    Baseline 13    Time 4    Period Weeks    Status New    Target Date 06/24/21      PT SHORT TERM GOAL #4   Title Pt will demonstrate <100% difference between TUG and COG TUG to indicate improved safety/balance during dual tasking    Baseline 120% difference    Time 4    Period Weeks    Status New    Target Date 06/24/21      PT SHORT TERM GOAL #5   Title Patient will negotiate 12 stairs with one rail and cane safely with supervision    Baseline Min A for safety    Time 4    Period Weeks    Status New    Target Date 06/24/21      Additional Short Term Goals   Additional Short Term Goals Yes      PT SHORT TERM GOAL #6   Title Pt will improve gait speed with RW vs. LRAD to at least 1.3 Ft/sec in order to demo decr fall risk/improved community mobility.    Baseline .93 FT/SEC with RW    Time 4    Period Weeks    Status New               PT Long Term Goals - 05/26/21 1633       PT LONG TERM GOAL #1   Title Pt will demonstrate ability to perform final HEP with supervision    Time 8    Period Weeks    Status New    Target Date 07/24/21      PT LONG TERM GOAL #2   Title Pt will demonstrate improvement in Mini-Best Test to >/= 20 points to indicate decreased falls risk    Time 8    Period Weeks    Status New    Target Date 07/24/21      PT LONG TERM GOAL #3   Title Pt will demonstrate WFL TUG and will demonstrate <80% difference between TUG and COG TUG to indicate improved safety during dual task    Time 8    Period Weeks    Status New    Target Date 07/24/21      PT LONG TERM GOAL #4   Title Pt will ambulate 230' over level surfaces  without AD with supervision    Baseline ambulates with cane    Time 8    Period Weeks    Status New    Target Date 07/24/21      PT LONG TERM GOAL #5   Title Pt will consistently perform sit <> stand transfers MOD I without posterior LOB    Time 8    Period Weeks    Status New    Target Date 07/24/21      Additional Long Term Goals   Additional Long Term Goals Yes      PT LONG TERM GOAL #6   Title Pt will improve gait speed with RW vs. LRAD to at least 2.0 ft/sec in order to demo decr fall risk/improved community mobility.    Baseline .93 ft/sec with RW    Time 8    Period Weeks  Status New                   Plan - 06/23/21 1633     Clinical Impression Statement Pt with BP today WFL for therapy. Worked on Personnel officer with RW with pt needing cues for incr step length/foot clearance and heel strike throughout, otherwise pt reverts to shuffled pattern. Worked on standing balance in // bars with decr UE support, needing min A at times for balance due to pt losing balance in posterior direction esp with narrow BOS. Discussed during static standing at RW making sure that pt has a wide BOS or wide/staggered BOS to help with balance. Will continue to progress towards LTGs.    Personal Factors and Comorbidities Comorbidity 3+;Past/Current Experience;Time since onset of injury/illness/exacerbation    Comorbidities BPH, COPD, CAD, depression, HLD, HTN, IBS, MI, PAF, OSA, skin CA, CVA with L sided residual weakness, coronary angioplasty, falls, movement disorder - diagnosis undetermined    Examination-Activity Limitations Locomotion Level;Stairs;Stand;Transfers    Examination-Participation Restrictions Community Activity    Stability/Clinical Decision Making Evolving/Moderate complexity    Rehab Potential Good    PT Frequency 2x / week    PT Duration 8 weeks    PT Treatment/Interventions ADLs/Self Care Home Management;DME Instruction;Gait training;Stair training;Functional  mobility training;Therapeutic activities;Therapeutic exercise;Balance training;Neuromuscular re-education;Orthotic Fit/Training;Patient/family education;Passive range of motion    PT Next Visit Plan check STGs this week. monitor BP. go over positions during static balance to help with posterior LOB. gait with RW/endurance. Stair Therapist, nutritional.  Balance reaction training-posterior LOB, SLS, weight shifting. BLE strengthening. SciFit/NuStep.    PT Home Exercise Plan [id number]    Consulted and Agree with Plan of Care Patient             Patient will benefit from skilled therapeutic intervention in order to improve the following deficits and impairments:  Abnormal gait, Decreased balance, Decreased strength, Difficulty walking, Pain  Visit Diagnosis: Repeated falls  Muscle weakness (generalized)  Other abnormalities of gait and mobility  Unsteadiness on feet     Problem List Patient Active Problem List   Diagnosis Date Noted   Insect bite 04/15/2021   Drug-induced tremor 03/20/2021   Depression, recurrent ([place]) 01/18/2021   Urinary frequency 01/18/2021   Partial small bowel obstruction ([place]) 01/13/2021   Thyroid nodule 12/23/2020   Suspected Movement disorder- possibly PD  12/22/2020   Hematoma- Paraspinal from Fall  12/22/2020   Frequent falls 11/13/2020   Orthostatic dizziness 08/28/2020   Statin myopathy 06/07/2019   Chronic obstructive pulmonary disease ([place]) 06/13/2018   Sleep apnea 09/16/2017   GERD (gastroesophageal reflux disease) 02/08/2015   IBS (irritable bowel syndrome) 02/08/2015   Essential hypertension 02/08/2015   AF (paroxysmal atrial fibrillation) ([place]) 01/23/2015   CAD (coronary artery disease) 01/23/2015   History of CVA (cerebrovascular accident) 01/23/2015    Arliss Journey, PT, DPT 06/23/2021, 4:35 PM  [facility] [address] [place], [place], [address] Phone: [phone]    Fax:  [phone]  Name: Macgregor Aeschliman MRN: [id number] Date of Birth: 04-06-1946

## 2021-06-25 ENCOUNTER — Other Ambulatory Visit: Payer: Self-pay

## 2021-06-25 ENCOUNTER — Ambulatory Visit: Payer: PPO | Admitting: Physical Therapy

## 2021-06-25 ENCOUNTER — Encounter: Payer: Self-pay | Admitting: Physical Therapy

## 2021-06-25 VITALS — BP 143/82 | HR 54

## 2021-06-25 DIAGNOSIS — R296 Repeated falls: Secondary | ICD-10-CM

## 2021-06-25 DIAGNOSIS — M6281 Muscle weakness (generalized): Secondary | ICD-10-CM

## 2021-06-25 DIAGNOSIS — R2681 Unsteadiness on feet: Secondary | ICD-10-CM

## 2021-06-25 DIAGNOSIS — R2689 Other abnormalities of gait and mobility: Secondary | ICD-10-CM

## 2021-06-25 NOTE — Therapy (Addendum)
[facility] [address] [place], [place], [address] Phone: [phone]   Fax:  [phone]  Physical Therapy Treatment  Patient Details  Name: [name] MRN: [id number] Date of Birth: Jan 25, 1946 Referring Provider (PT): Andria Frames Jamal Collin, MD; [name], MD   Encounter Date: 06/25/2021   PT End of Session - 06/25/21 1446     Visit Number 7    Number of Visits 17    Date for PT Re-Evaluation 07/24/21    Authorization Type [employer]    Progress Note Due on Visit 10    PT Start Time 1403    PT Stop Time 1638    PT Time Calculation (min) 42 min    Equipment Utilized During Treatment Gait belt    Activity Tolerance Patient tolerated treatment well    Behavior During Therapy [facility] for tasks assessed/performed             Past Medical History:  Diagnosis Date   BPH (benign prostatic hyperplasia)    COPD (chronic obstructive pulmonary disease) ([place]) 2021   Coronary artery disease    a. s/p PCI to LCx in 2007; b. MV 2017 no sig ischemia, EF 59%, nl study. c. Cath 01/2020 without significant recurrent dz.   Depression    GERD (gastroesophageal reflux disease)    Hyperlipidemia    Hypertension    IBS (irritable bowel syndrome)    Myocardial infarction ([place])    12-13 yrs ago    PAF (paroxysmal atrial fibrillation) ([place])    a. CHADS2VASc => 5 (HTN, age x 1, stroke x 2, vascular disease); b. on Coumadin    PNA (pneumonia)    Skin cancer    face    Sleep apnea    off cpap    Stroke ([place])    a. x 2 with residual left-sided weakness    Past Surgical History:  Procedure Laterality Date   [place]  2006   X1 STENT   COLONOSCOPY     CORONARY ANGIOPLASTY     POLYPECTOMY     RIGHT/LEFT HEART CATH AND CORONARY ANGIOGRAPHY N/A 01/25/2020   Procedure: RIGHT/LEFT HEART CATH AND CORONARY ANGIOGRAPHY;  Surgeon: Minna Merritts, MD;  Location: [facility];  Service: Cardiovascular;  Laterality: N/A;   SKIN CANCER EXCISION N/A 08/2016   [place].  [facility] (Dr. Deliah Boston)   THYROIDECTOMY, PARTIAL     UPPER GASTROINTESTINAL ENDOSCOPY     VASCULAR SURGERY     varicose vein stripping left leg    Vitals:   06/25/21 1407  BP: (!) 143/82  Pulse: (!) 54     Subjective Assessment - 06/25/21 1407     Subjective Had a fall after therapy on Monday. Was walking to his couch and was working on turning and fell (was not holding onto the RW). Scraped his back and his upper arm. Was very tired yesterday.    Patient is accompained by: Family member    Pertinent History BPH, COPD, CAD, depression, HLD, HTN, IBS, MI, PAF, OSA, skin CA, CVA with L sided residual weakness, coronary angioplasty    Limitations Walking;Standing    Patient Stated Goals To be able to walk with a cane; to improve balance - pt falls backwards    Currently in Pain? Yes    Pain Score 2     Pain Location --   tightness in thighs   Pain Orientation Left;Right  Pain Descriptors / Indicators Tightness;Sore    Pain Type Acute pain                [facility] PT Assessment - 06/25/21 1416       Timed Up and Go Test   Normal TUG (seconds) 26.38    Cognitive TUG (seconds) 45.63    TUG Comments count backwards by 3 from 30; 120% difference between TUG and COG TUG, with RW                          06/25/21 1416  Transfers  Transfers Sit to Stand;Stand to [employer] Transfers  Sit to Stand 5: Supervision;With upper extremity assist  Sit to Stand Details (indicate cue type and reason) From transport chair with RW and from mat table  Stand to Sit 5: Supervision;With upper extremity assist  Stand to Sit Details Cued to turn with RW and back up to surface with BLE before sitting down for incr safety.  Comments x10 reps sit to stands from mat table with BUE support and support from RW in standing, pt with no episodes of retropulsion when  performing. Performed TUG shuttle with 2 chairs set 10' away from each other with focus on sit <> stands and turning all the way with RW before sitting down, performed x3 reps total. Pt with improved safety awareness and technique, performing with supervision. Educated on importance of making sure pt does this at home for incr safety and to decr his fall risk.  Ambulation/Gait  Ambulation/Gait Yes  Ambulation/Gait Assistance 5: Supervision  Ambulation/Gait Assistance Details Performed first lap with RW and cues for weight shift/push off and incr step length bilat. Pt unable to maintain incr step length and needing consistent cues throughout. Trialed use of red tband at bottom of RW during 2nd lap for visual cue for stepping to the red, pt with improvement of step length bilat, with needing intermittent cues at times to glance down and make sure that he is continuing to step to the red. Provided band for home with wife verbalizing understanding of cues to give to pt to step to the band  Ambulation Distance (Feet) 115 Feet (x1, 115 x 1)  Assistive device Rolling walker  Gait Pattern Step-through pattern;Decreased step length - left;Decreased dorsiflexion - left;Decreased trunk rotation;Poor foot clearance - left;Decreased stance time - left;Decreased dorsiflexion - right;Decreased weight shift to left;Shuffle;Narrow base of support  Ambulation Surface Level;Indoor  Gait velocity 33.44 seconds = .98 ft/sec      06/30/21 1605  PT Education  Education Details Results of goals, using red tband at bottom of RW for visual cue for step length  Person(s) Educated Patient;Spouse  Methods Explanation;Demonstration;Verbal cues  Comprehension Verbalized understanding;Returned demonstration         PT Short Term Goals - 06/25/21 1417       PT SHORT TERM GOAL #1   Title Pt will demonstrate ability to perform initial HEP safely with supervision.    Baseline pt intermittently performs at home depending  on fatigue level.    Time 4    Period Weeks    Status Partially Met    Target Date 06/24/21      PT SHORT TERM GOAL #2   Title Pt will demonstrate ability to perform sit <> stand from variety of surfaces with consistent supervision without posterior LOB    Baseline no posterior LOB    Time 4    Period Weeks  Status Achieved    Target Date 06/24/21      PT SHORT TERM GOAL #3   Title Pt will improve Mini-Best Test by 4 points to indicate decreased falls risk    Baseline 13    Time 4    Period Weeks    Status Deferred    Target Date 06/24/21      PT SHORT TERM GOAL #4   Title Pt will demonstrate <100% difference between TUG and COG TUG to indicate improved safety/balance during dual tasking    Baseline 120% difference; TUG 26.38 seconds, cog TUG 45.63    Time 4    Period Weeks    Status Not Met    Target Date 06/24/21      PT SHORT TERM GOAL #5   Title Patient will negotiate 12 stairs with one rail and cane safely with supervision    Baseline Min A for safety, did not have time to perform    Time 4    Period Weeks    Status Deferred    Target Date 06/24/21      PT SHORT TERM GOAL #6   Title Pt will improve gait speed with RW vs. LRAD to at least 1.3 Ft/sec in order to demo decr fall risk/improved community mobility.    Baseline .51 FT/SEC with RW; .98 ft/sec with RW on 06/25/21    Time 4    Period Weeks    Status Not Met               PT Long Term Goals - 05/26/21 1633       PT LONG TERM GOAL #1   Title Pt will demonstrate ability to perform final HEP with supervision    Time 8    Period Weeks    Status New    Target Date 07/24/21      PT LONG TERM GOAL #2   Title Pt will demonstrate improvement in Mini-Best Test to >/= 20 points to indicate decreased falls risk    Time 8    Period Weeks    Status New    Target Date 07/24/21      PT LONG TERM GOAL #3   Title Pt will demonstrate WFL TUG and will demonstrate <80% difference between TUG and COG TUG to  indicate improved safety during dual task    Time 8    Period Weeks    Status New    Target Date 07/24/21      PT LONG TERM GOAL #4   Title Pt will ambulate 230' over level surfaces without AD with supervision    Baseline ambulates with cane    Time 8    Period Weeks    Status New    Target Date 07/24/21      PT LONG TERM GOAL #5   Title Pt will consistently perform sit <> stand transfers MOD I without posterior LOB    Time 8    Period Weeks    Status New    Target Date 07/24/21      Additional Long Term Goals   Additional Long Term Goals Yes      PT LONG TERM GOAL #6   Title Pt will improve gait speed with RW vs. LRAD to at least 2.0 ft/sec in order to demo decr fall risk/improved community mobility.    Baseline .93 ft/sec with RW    Time 8    Period Weeks    Status New  06/30/21 1610  Plan  Clinical Impression Statement Today's skilled session focused on checking pt's STGs. Pt met STG #2 - able to perform sit <> stands from mat table and pt's transport chair with proper technique and no episodes of posterior LOB. Pt partially met STG #1 in regards to HEP. Pt did not meet STGs in regards to TUG/cog TUG and gait speed. Pt's gait speed did not change very much since when first assessed. Performed at .25 ft/sec with RW today (previously .93 ft/sec). Attempted to use the red tband tied around bottom of pt's RW as a visual cue today for step length which worked better than just verbal cues. Pt also demonstrating improved foot clearance with heel strike. Gave pt band for home to put on his RW and will continue to practice. Pt to follow up with Dr. Carles Collet next week. Will continue to progress towards LTGs.  Personal Factors and Comorbidities Comorbidity 3+;Past/Current Experience;Time since onset of injury/illness/exacerbation  Comorbidities BPH, COPD, CAD, depression, HLD, HTN, IBS, MI, PAF, OSA, skin CA, CVA with L sided residual weakness, coronary angioplasty, falls,  movement disorder - diagnosis undetermined  Examination-Activity Limitations Locomotion Level;Stairs;Stand;Transfers  Examination-Participation Restrictions Community Activity  Pt will benefit from skilled therapeutic intervention in order to improve on the following deficits Abnormal gait;Decreased balance;Decreased strength;Difficulty walking;Pain  Rehab Potential Good  PT Frequency 2x / week  PT Duration 8 weeks  PT Treatment/Interventions ADLs/Self Care Home Management;DME Instruction;Gait training;Stair training;Functional mobility training;Therapeutic activities;Therapeutic exercise;Balance training;Neuromuscular re-education;Orthotic Fit/Training;Patient/family education;Passive range of motion  PT Next Visit Plan look at stairs. add posterior stepping to HEP.  monitor BP. go over positions during static balance to help with posterior LOB. gait with RW/endurance and use of red tband as visual cue. Balance reaction training-posterior LOB, SLS, weight shifting. BLE strengthening. SciFit/NuStep.  PT Home Exercise Plan [id number]  Consulted and Agree with Plan of Care Patient;Family member/caregiver     Patient will benefit from skilled therapeutic intervention in order to improve the following deficits and impairments:     Visit Diagnosis: Repeated falls  Muscle weakness (generalized)  Other abnormalities of gait and mobility  Unsteadiness on feet     Problem List Patient Active Problem List   Diagnosis Date Noted   Insect bite 04/15/2021   Drug-induced tremor 03/20/2021   Depression, recurrent ([place]) 01/18/2021   Urinary frequency 01/18/2021   Partial small bowel obstruction ([place]) 01/13/2021   Thyroid nodule 12/23/2020   Suspected Movement disorder- possibly PD  12/22/2020   Hematoma- Paraspinal from Fall  12/22/2020   Frequent falls 11/13/2020   Orthostatic dizziness 08/28/2020   Statin myopathy 06/07/2019   Chronic obstructive pulmonary disease ([place]) 06/13/2018   Sleep  apnea 09/16/2017   GERD (gastroesophageal reflux disease) 02/08/2015   IBS (irritable bowel syndrome) 02/08/2015   Essential hypertension 02/08/2015   AF (paroxysmal atrial fibrillation) ([place]) 01/23/2015   CAD (coronary artery disease) 01/23/2015   History of CVA (cerebrovascular accident) 01/23/2015    Arliss Journey, PT, DPT  06/25/2021, 2:47 PM  [facility] [address] [place], [place], [address] Phone: [phone]   Fax:  [phone]  Name: [name] MRN: [id number] Date of Birth: 09-19-1945

## 2021-06-30 ENCOUNTER — Telehealth: Payer: Self-pay

## 2021-06-30 NOTE — Progress Notes (Signed)
Assessment/Plan:   Gait instability and falls with upgoing toe on the left (could be chronic from William stroke). -Patient has had very extens ive testing as an inpatient at [facility].   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   -I do not think that the patient has Parkinson's disease.  Reviewed that again today. -Patient's exam findings are confused by the fact that he has had a stroke with residual left-sided symptoms. -Despite negative DaTscan, I do not think atypical state, particularly PSP can be ruled out.  Discussed this with patient and wife today.  Discussed that this really is something that we will be able to tell more with course of time.  Levodopa really is not helping him and I am going to go ahead and discontinue that. -Check GAD Ab today -Patient with leg pain.  Add pramipexole, 0.25 mg at bedtime for leg pain to see if it helps. -Extensive discussion regarding advance care directives.  Patient DNR.  Forms filled out.  Forms filled out for MOST    2.  Depression  -Patient does not think the Cymbalta is helping.  Primary care may need to address this.  3.  Dysphagia  -MBE will be performed.     Subjective:   Update: June 30, 2021: We tried to stop the patient's levodopa last visit, but he called and stated he did not do well with that.  Felt he had more retropulsion.  He was told it was okay to restart it, but asked him to stop it about 2 days prior to today's visit so that I could evaluate him off of the medication.  However, when he went back on it, they didn't notice a big improvement.  They wonder if the stopping of therapy was what made a difference.  Pt/wife report he has been off of carbidopa/levodopa since 06/27/21.  He is back in PT now.  He  didn't notice a difference the last 5 days being off of levodopa.  c/o leg pain when goes to bed.   Doesn't happen in the day.  Sx's x 3-4 years.    May 15, 2021: Patient has been doing fairly well since our last visit.  He is able to go up and down the stairs, which she was not able to do.  Part of him thinks he is doing a little bit worse, but he is supposed to start physical therapy soon.  He has no double vision.  He has a little bit of trouble swallowing solids.  He admits to dragging the left leg, but thinks that is residual from his William stroke.  [name] was seen today in the movement disorders clinic for neurologic consultation at the request of William Dad, MD.  The consultation is for the evaluation of possible Parkinson's disease.  Patient previously seen by [name].  Extensive review of records was undertaken.  Patient has also been seen by Dr. Rexene [facility], last seen in March, 2022.  Dr. [facility] noted that patient went to the emergency room in March, 2022 with sudden onset of severe imbalance and that the patient had work-up including CTA of the head and neck, MRI I of the brain and cervical spine, which she noted were nonacute.  It appears that she felt his symptoms were due to vertigo.  She noted that  patient's wife was worried about Parkinson's but Dr. [facility] did not feel that the patient had any signs of parkinsonism.  Patient unfortunately continued to have falls and presented to the emergency room at [name] on July 3 after another fall.  Patient was felt to be parkinsonian and was started on levodopa.  They noted that patient had cogwheel rigidity in the right upper extremity, shuffling gait and propensity to lean backwards.  Patient had EMG that was unremarkable.  Myopathy labs were normal.  Paraneoplastic/autoimmune labs were normal.  MRI brain that demonstrated chronic white matter disease and diffuse atrophy.  They wanted to do a DaTscan, but apparently those were not able to be  done as an inpatient at [name].  Patient subsequently followed up with outpatient neurology at [name] on August 4.  They noted that they were going to consider a possible lumbar puncture pending MRI of his cervical, thoracic, lumbar spine, as that was unable to be done as an inpatient because he had a paraspinal hematoma after his fall on Coumadin.  However, the patient was back on Coumadin (and lumbar punctures cannot be done on Coumadin).  Patient's MRI cervical, thoracic, lumbar spine which were all done with and without contrast) were done on January 31, 2021 at [name].  I only have those reports.  There was severe neuroforaminal stenosis at C5-C6 bilaterally and C6-C7 on the left.  The thoracic spine was essentially unremarkable.  There was disc bulging there was fairly diffuse throughout the lumbar spine.  Patient was seen by the resident at the [name] on September 30.  She changed his carbidopa/levodopa 25/100, 1 tablet 3 times per day to carbidopa/levodopa 50/200 CR 3 times per day.  Wife states that he got very nauseated with taking just 2 of the 25/100 dosages and so they are taking one at 6am/2pm/10pm.  Pt doesn't think that this medication has been helpful at all.  Biggest c/o is currently constipation.       PREVIOUS MEDICATIONS: Sinemet  ALLERGIES:   Allergies  Allergen Reactions   Bactrim [Sulfamethoxazole-Trimethoprim] Anaphylaxis    Ulcers   Ciprofloxacin Swelling    Tongue   Crestor [Rosuvastatin Calcium]     Severe Myalgias    CURRENT MEDICATIONS:  Current Outpatient Medications  Medication Instructions   acetaminophen (TYLENOL) 650 mg, Oral, Every 6 hours PRN   amiodarone (PACERONE) 200 mg, Oral, Daily   amiodarone (PACERONE) 200 mg, Oral, Daily   amLODipine (NORVASC) 2.5 mg, Oral, Daily   aspirin 81 mg, Oral, Daily with supper   bisoprolol (ZEBETA) 2.5 mg, Oral, Daily   bisoprolol (ZEBETA) 5 mg, Oral, Daily   citalopram (CELEXA) 40 MG  tablet TAKE 1 TABLET BY MOUTH EVERY DAY   diphenhydramine-acetaminophen (TYLENOL PM) 25-500 MG TABS tablet 2 tablets, Oral, At bedtime PRN   fluticasone (FLONASE) 50 MCG/ACT nasal spray 1 spray, Each Nare, At bedtime PRN   lisinopril (ZESTRIL) 40 mg, Oral, Daily with supper, For blood pressure.   Multiple Vitamins-Minerals (MULTIVITAMIN WITH MINERALS) tablet 1 tablet, Oral, Daily with supper, Centrum Silver    nitroGLYCERIN (NITROSTAT) 0.4 mg, Sublingual, Every 5 min PRN   omeprazole (PRILOSEC) 40 MG capsule TAKE 1 CAPSULE (40 MG TOTAL) BY MOUTH 2 (TWO) TIMES DAILY BEFORE A MEAL. FOR HEARTBURN.   ondansetron (ZOFRAN-ODT) 4 MG disintegrating tablet TAKE 1 TABLET BY MOUTH EVERY 8 HOURS AS NEEDED FOR NAUSEA AND VOMITING   Praluent 75 mg, Subcutaneous, Every 14 days   pramipexole (MIRAPEX) 0.25  mg, Oral, Daily at bedtime   pravastatin (PRAVACHOL) 80 mg, Oral, Daily, For cholesterol.   psyllium (HYDROCIL/METAMUCIL) 95 % PACK 1 packet, Oral, Daily   senna (SENOKOT) 8.6 mg, Oral, Daily, For constipation   solifenacin (VESICARE) 5 mg, Oral, Daily   tiZANidine (ZANAFLEX) 4 MG tablet TAKE 1 TABLET BY MOUTH AT BEDTIME AS NEEDED FOR MUSCLE SPASMS.   triamcinolone cream (KENALOG) 0.1 % 1 application, Topical, Daily PRN   vitamin B-12 (CYANOCOBALAMIN) 1,000 mcg, Oral, Daily with supper   warfarin (COUMADIN) 7.5 MG tablet Take 1 tablet (7.5mg  ) by mouth on Sun and Thurs and all other days take 1/2 tablet (3.75mg ) at supper time.    Objective:   VITALS:   Vitals:   07/02/21 0848  BP: 131/62  Pulse: 67  SpO2: 97%  Weight: 179 lb (81.2 kg)  Height: 6' (1.829 m)      GEN:  The patient appears stated age and is in NAD. HEENT:  Normocephalic, atraumatic.  The mucous membranes are moist. The superficial temporal arteries are without ropiness or tenderness. CV:  RRR Lungs:  CTAB Neck/HEME:  There are no carotid bruits bilaterally.  Neurological examination:  Orientation: The patient is alert and  oriented x3.  Cranial nerves: There is good facial symmetry.  There is facial hypomimia.  Extraocular muscles are intact. No square wave jerks.  The visual fields are full to confrontational testing. The speech is fluent and clear. Soft palate rises symmetrically and there is no tongue deviation. Hearing is intact to conversational tone. Sensation: Sensation is intact to light to light touch throughout. Motor: Strength is 5/5 in the bilateral upper and lower extremities.  There is trouble with finger abduction on the L   Shoulder shrug is equal and symmetric.  There is no pronator drift. Deep tendon reflexes: Deep tendon reflexes are 3+/4 at the bilateral biceps, triceps, brachioradialis, patella and achilles. Plantar response is neutral on the right and upgoing on the L (striatal toe)  Movement examination: Tone: There is nl tone in the bilateral upper extremities.  The tone in the left lower extremity is increased Abnormal movements: None Coordination:  There is slowness of rapid alternating movements on the left (patient reports chronic since stroke) Gait and Station: The patient pushes off of the transport chair to arise.  He states that he can do this without his walker.  He drags the left leg.  He does not shuffle, but is not particularly stable.  He is stiff when he walks, almost spastic. I have reviewed and interpreted the following labs independently   Chemistry      Component Value Date/Time   NA 137 01/14/2021 0130   NA 138 12/23/2020 1112   K 3.8 01/14/2021 0130   CL 103 01/14/2021 0130   CO2 27 01/14/2021 0130   BUN 13 01/14/2021 0130   BUN 15 12/23/2020 1112   CREATININE 1.00 01/14/2021 0130   CREATININE 1.07 07/14/2019 1439   GLU 102 10/16/2014 1156      Component Value Date/Time   CALCIUM 8.5 (L) 01/14/2021 0130   ALKPHOS 94 01/13/2021 0205   AST 21 01/13/2021 0205   ALT 15 01/13/2021 0205   BILITOT 0.6 01/13/2021 0205   BILITOT 0.6 12/23/2020 1112      Lab Results   Component Value Date   TSH 2.080 08/16/2020   Lab Results  Component Value Date   WBC 8.0 01/14/2021   HGB 13.0 01/14/2021   HCT 39.0 01/14/2021   MCV  95.6 01/14/2021   PLT 188 01/14/2021    Total time with patient and coordinating care was 42 minutes.  Cc:  William Dad, MD

## 2021-06-30 NOTE — Telephone Encounter (Signed)
Called patient on Friday and sent a My Chart message called patient to day as well and left voicemail to hold medication carbidopa levodopa for 48 hours before his upcoming appt on Wednesday

## 2021-07-02 ENCOUNTER — Other Ambulatory Visit: Payer: Self-pay

## 2021-07-02 ENCOUNTER — Other Ambulatory Visit: Payer: Self-pay | Admitting: Family Medicine

## 2021-07-02 ENCOUNTER — Ambulatory Visit: Payer: PPO | Admitting: Neurology

## 2021-07-02 ENCOUNTER — Other Ambulatory Visit (INDEPENDENT_AMBULATORY_CARE_PROVIDER_SITE_OTHER): Payer: PPO

## 2021-07-02 ENCOUNTER — Telehealth (HOSPITAL_COMMUNITY): Payer: Self-pay

## 2021-07-02 VITALS — BP 131/62 | HR 67 | Ht 72.0 in | Wt 179.0 lb

## 2021-07-02 DIAGNOSIS — R29898 Other symptoms and signs involving the musculoskeletal system: Secondary | ICD-10-CM | POA: Diagnosis not present

## 2021-07-02 DIAGNOSIS — R1310 Dysphagia, unspecified: Secondary | ICD-10-CM

## 2021-07-02 DIAGNOSIS — G231 Progressive supranuclear ophthalmoplegia [Steele-Richardson-Olszewski]: Secondary | ICD-10-CM

## 2021-07-02 MED ORDER — PRAMIPEXOLE DIHYDROCHLORIDE 0.25 MG PO TABS: 0.2500 mg | ORAL_TABLET | Freq: Every day | ORAL | 1 refills | Status: DC

## 2021-07-02 NOTE — Patient Instructions (Signed)
Your provider has requested that you have labwork completed today. The lab is located on the Second floor at [address], within the [facility] office. When you get off the elevator, turn right and go in the  Endocrinology [address]; the first brown door on the left.  Tell the ladies behind the desk that you are there for lab work. If you are not called within 15 minutes please check with the front desk.   Once you complete your labs you are free to go. You will receive a call or message via MyChart with your lab results.    

## 2021-07-02 NOTE — Telephone Encounter (Signed)
Attempted to contact patient to schedule OP MBS - left voicemail. ?

## 2021-07-04 ENCOUNTER — Ambulatory Visit: Payer: PPO | Admitting: Physical Therapy

## 2021-07-04 ENCOUNTER — Other Ambulatory Visit: Payer: Self-pay

## 2021-07-04 ENCOUNTER — Encounter: Payer: Self-pay | Admitting: Physical Therapy

## 2021-07-04 DIAGNOSIS — R2689 Other abnormalities of gait and mobility: Secondary | ICD-10-CM

## 2021-07-04 DIAGNOSIS — R296 Repeated falls: Secondary | ICD-10-CM

## 2021-07-04 DIAGNOSIS — M6281 Muscle weakness (generalized): Secondary | ICD-10-CM

## 2021-07-04 DIAGNOSIS — R2681 Unsteadiness on feet: Secondary | ICD-10-CM

## 2021-07-04 NOTE — Therapy (Addendum)
[facility] [address] [place], [place], [address] Phone: [phone]   Fax:  [phone]  Physical Therapy Treatment  Patient Details  Name: [name] MRN: [id number] Date of Birth: July 27, 1945 Referring Provider (PT): Andria Frames Jamal Collin, MD; [name], MD   Encounter Date: 07/04/2021   PT End of Session - 07/04/21 1405     Visit Number 8    Number of Visits 17    Date for PT Re-Evaluation 07/24/21    Authorization Type [employer]    Progress Note Due on Visit 10    PT Start Time 1400    PT Stop Time 2197    PT Time Calculation (min) 45 min    Equipment Utilized During Treatment Gait belt    Activity Tolerance Patient tolerated treatment well    Behavior During Therapy [facility] for tasks assessed/performed             Past Medical History:  Diagnosis Date   BPH (benign prostatic hyperplasia)    COPD (chronic obstructive pulmonary disease) ([place]) 2021   Coronary artery disease    a. s/p PCI to LCx in 2007; b. MV 2017 no sig ischemia, EF 59%, nl study. c. Cath 01/2020 without significant recurrent dz.   Depression    GERD (gastroesophageal reflux disease)    Hyperlipidemia    Hypertension    IBS (irritable bowel syndrome)    Myocardial infarction ([place])    12-13 yrs ago    PAF (paroxysmal atrial fibrillation) ([place])    a. CHADS2VASc => 5 (HTN, age x 1, stroke x 2, vascular disease); b. on Coumadin    PNA (pneumonia)    Skin cancer    face    Sleep apnea    off cpap    Stroke ([place])    a. x 2 with residual left-sided weakness    Past Surgical History:  Procedure Laterality Date   [place]  2006   X1 STENT   COLONOSCOPY     CORONARY ANGIOPLASTY     POLYPECTOMY     RIGHT/LEFT HEART CATH AND CORONARY ANGIOGRAPHY N/A 01/25/2020   Procedure: RIGHT/LEFT HEART CATH AND CORONARY ANGIOGRAPHY;  Surgeon: Minna Merritts, MD;  Location: [facility];  Service: Cardiovascular;  Laterality: N/A;   SKIN CANCER EXCISION N/A 08/2016   [place].  [facility] (Dr. Deliah Boston)   THYROIDECTOMY, PARTIAL     UPPER GASTROINTESTINAL ENDOSCOPY     VASCULAR SURGERY     varicose vein stripping left leg    There were no vitals filed for this visit.   Subjective Assessment - 07/04/21 1407     Subjective Saw Dr. Carles Collet the other day, was diagnosed with likely PSP. Has another follow up in 6 months. No falls. Legs have been feeling weaker and he is shakier. Was given pramipexole for his leg pain at night.    Patient is accompained by: Family member    Pertinent History BPH, COPD, CAD, depression, HLD, HTN, IBS, MI, PAF, OSA, skin CA, CVA with L sided residual weakness, coronary angioplasty    Limitations Walking;Standing    Patient Stated Goals To be able to walk with a cane; to improve balance - pt falls backwards    Currently in Pain? No/denies  Balance Exercises - 07/04/21 1448       Balance Exercises: Standing   Stepping Strategy Posterior;Anterior;Lateral;UE support;10 reps;Limitations    Stepping Strategy Limitations In // bars; x10 reps of each direction alternating legs. Needs initial cues for technique with weight shift and foot clearance. Pt with incr difficulty bringing LLE back to midline.    Other Standing Exercises on level ground: x10 reps weight shifting A/P keeping feet flat on the ground, repeated standing on single pillow x12 reps - with intermittent taps to bars for balance when going in posterior direction.              07/07/21 0717  Ambulation/Gait  Ambulation/Gait Yes  Ambulation/Gait Assistance 5: Supervision  Ambulation/Gait Assistance Details Utilized tband at bottom of RW as visual cue for step length during gait. Pt needing intermittent cues throughout to glance down and look at step length. Pt's legs appearing more stiff with gait today.  Pt with decr push off bilat.  Ambulation Distance (Feet) 100 Feet (x2)  Assistive device Rolling walker  Gait Pattern Step-through pattern;Decreased step length - left;Decreased dorsiflexion - left;Decreased trunk rotation;Poor foot clearance - left;Decreased stance time - left;Decreased dorsiflexion - right;Decreased weight shift to left;Shuffle;Narrow base of support  Ambulation Surface Level;Indoor  Therapeutic Activites   Therapeutic Activities Other Therapeutic Activities  Other Therapeutic Activities Went over fall prevention in the home with pt and pt's spouse and provided handout for home. Showed pt and pt's spouse a seated peddle bike for home use for safe aerobic activity and something that he can perform throughout the day (sometimes is too fatigued for standing exercises). Explained the benefits of using one and where to purchase on [employer].  Exercises  Exercises Other Exercises  Other Exercises  Pt seated at edge of mat table on seated peddle bike for ~4 minutes in forwards direction with pt trialing it for home use. Pt fatigued after 4 minutes and needed a brief rest break.      PT Education - 07/04/21 1446     Education Details where to buy seated peddle bike ([employer]) and benefits of using one, fall prevention handout    Person(s) Educated Patient;Spouse    Methods Explanation;Handout    Comprehension Verbalized understanding              PT Short Term Goals - 06/25/21 1417       PT SHORT TERM GOAL #1   Title Pt will demonstrate ability to perform initial HEP safely with supervision.    Baseline pt intermittently performs at home depending on fatigue level.    Time 4    Period Weeks    Status Partially Met    Target Date 06/24/21      PT SHORT TERM GOAL #2   Title Pt will demonstrate ability to perform sit <> stand from variety of surfaces with consistent supervision without posterior LOB    Baseline no posterior LOB    Time 4    Period Weeks    Status  Achieved    Target Date 06/24/21      PT SHORT TERM GOAL #3   Title Pt will improve Mini-Best Test by 4 points to indicate decreased falls risk    Baseline 13    Time 4    Period Weeks    Status Deferred    Target Date 06/24/21      PT SHORT TERM GOAL #4   Title Pt will demonstrate <100% difference between TUG and COG TUG  to indicate improved safety/balance during dual tasking    Baseline 120% difference; TUG 26.38 seconds, cog TUG 45.63    Time 4    Period Weeks    Status Not Met    Target Date 06/24/21      PT SHORT TERM GOAL #5   Title Patient will negotiate 12 stairs with one rail and cane safely with supervision    Baseline Min A for safety, did not have time to perform    Time 4    Period Weeks    Status Deferred    Target Date 06/24/21      PT SHORT TERM GOAL #6   Title Pt will improve gait speed with RW vs. LRAD to at least 1.3 Ft/sec in order to demo decr fall risk/improved community mobility.    Baseline .73 FT/SEC with RW; .98 ft/sec with RW on 06/25/21    Time 4    Period Weeks    Status Not Met               PT Long Term Goals - 05/26/21 1633       PT LONG TERM GOAL #1   Title Pt will demonstrate ability to perform final HEP with supervision    Time 8    Period Weeks    Status New    Target Date 07/24/21      PT LONG TERM GOAL #2   Title Pt will demonstrate improvement in Mini-Best Test to >/= 20 points to indicate decreased falls risk    Time 8    Period Weeks    Status New    Target Date 07/24/21      PT LONG TERM GOAL #3   Title Pt will demonstrate WFL TUG and will demonstrate <80% difference between TUG and COG TUG to indicate improved safety during dual task    Time 8    Period Weeks    Status New    Target Date 07/24/21      PT LONG TERM GOAL #4   Title Pt will ambulate 230' over level surfaces without AD with supervision    Baseline ambulates with cane    Time 8    Period Weeks    Status New    Target Date 07/24/21      PT  LONG TERM GOAL #5   Title Pt will consistently perform sit <> stand transfers MOD I without posterior LOB    Time 8    Period Weeks    Status New    Target Date 07/24/21      Additional Long Term Goals   Additional Long Term Goals Yes      PT LONG TERM GOAL #6   Title Pt will improve gait speed with RW vs. LRAD to at least 2.0 ft/sec in order to demo decr fall risk/improved community mobility.    Baseline .93 ft/sec with RW    Time 8    Period Weeks    Status New                07/07/21 0726  Plan  Clinical Impression Statement Provided education today on fall prevention in the home and provided handout. Pt also trialed seated peddle bike and gave information on how to purchase for home for safe aerobic exercise. Remainder of session focused on standing balance strategies with stepping strategies in all directions and weight shifting in the A/P direction. Pt tolerated session well, will continue to progress towards LTGs.  Personal Factors and Comorbidities Comorbidity 3+;Past/Current Experience;Time since onset of injury/illness/exacerbation  Comorbidities BPH, COPD, CAD, depression, HLD, HTN, IBS, MI, PAF, OSA, skin CA, CVA with L sided residual weakness, coronary angioplasty, falls, movement disorder - diagnosis undetermined  Examination-Activity Limitations Locomotion Level;Stairs;Stand;Transfers  Examination-Participation Restrictions Community Activity  Pt will benefit from skilled therapeutic intervention in order to improve on the following deficits Abnormal gait;Decreased balance;Decreased strength;Difficulty walking;Pain  Rehab Potential Good  PT Frequency 2x / week  PT Duration 8 weeks  PT Treatment/Interventions ADLs/Self Care Home Management;DME Instruction;Gait training;Stair training;Functional mobility training;Therapeutic activities;Therapeutic exercise;Balance training;Neuromuscular re-education;Orthotic Fit/Training;Patient/family education;Passive range of  motion  PT Next Visit Plan look at stairs. add posterior stepping to HEP.  monitor BP.gait with RW/endurance and use of red tband as visual cue. Balance reaction training-posterior LOB, SLS, weight shifting. BLE strengthening. SciFit/NuStep.  PT Home Exercise Plan [id number]  Consulted and Agree with Plan of Care Patient;Family member/caregiver        Patient will benefit from skilled therapeutic intervention in order to improve the following deficits and impairments:     Visit Diagnosis: Repeated falls  Muscle weakness (generalized)  Other abnormalities of gait and mobility  Unsteadiness on feet     Problem List Patient Active Problem List   Diagnosis Date Noted   Insect bite 04/15/2021   Drug-induced tremor 03/20/2021   Depression, recurrent ([place]) 01/18/2021   Urinary frequency 01/18/2021   Partial small bowel obstruction ([place]) 01/13/2021   Thyroid nodule 12/23/2020   Suspected Movement disorder- possibly PD  12/22/2020   Hematoma- Paraspinal from Fall  12/22/2020   Frequent falls 11/13/2020   Orthostatic dizziness 08/28/2020   Statin myopathy 06/07/2019   Chronic obstructive pulmonary disease ([place]) 06/13/2018   Sleep apnea 09/16/2017   GERD (gastroesophageal reflux disease) 02/08/2015   IBS (irritable bowel syndrome) 02/08/2015   Essential hypertension 02/08/2015   AF (paroxysmal atrial fibrillation) ([place]) 01/23/2015   CAD (coronary artery disease) 01/23/2015   History of CVA (cerebrovascular accident) 01/23/2015    Arliss Journey, PT, DPT  07/04/2021, 2:49 PM  [facility] [address] [place], [place], [address] Phone: [phone]   Fax:  [phone]  Name: [name] MRN: [id number] Date of Birth: [birth year]

## 2021-07-04 NOTE — Patient Instructions (Signed)

## 2021-07-05 LAB — GLUTAMIC ACID DECARBOXYLASE AUTO ABS: Glutamic Acid Decarb Ab: <5 IU/mL (ref <5)

## 2021-07-09 ENCOUNTER — Emergency Department (HOSPITAL_COMMUNITY)
Admission: EM | Admit: 2021-07-09 | Discharge: 2021-07-09 | Disposition: A | Discharge status: Home / Self Care | Payer: PPO | Attending: Emergency Medicine | Admitting: Emergency Medicine

## 2021-07-09 ENCOUNTER — Emergency Department (HOSPITAL_COMMUNITY): Payer: PPO

## 2021-07-09 ENCOUNTER — Ambulatory Visit: Payer: PPO | Admitting: Physical Therapy

## 2021-07-09 DIAGNOSIS — M25552 Pain in left hip: Secondary | ICD-10-CM | POA: Diagnosis not present

## 2021-07-09 DIAGNOSIS — S0990XA Unspecified injury of head, initial encounter: Secondary | ICD-10-CM | POA: Diagnosis present

## 2021-07-09 DIAGNOSIS — Z7982 Long term (current) use of aspirin: Secondary | ICD-10-CM | POA: Insufficient documentation

## 2021-07-09 DIAGNOSIS — Z79899 Other long term (current) drug therapy: Secondary | ICD-10-CM | POA: Insufficient documentation

## 2021-07-09 DIAGNOSIS — Z8673 Personal history of transient ischemic attack (TIA), and cerebral infarction without residual deficits: Secondary | ICD-10-CM | POA: Insufficient documentation

## 2021-07-09 DIAGNOSIS — J449 Chronic obstructive pulmonary disease, unspecified: Secondary | ICD-10-CM | POA: Insufficient documentation

## 2021-07-09 DIAGNOSIS — Z7951 Long term (current) use of inhaled steroids: Secondary | ICD-10-CM | POA: Insufficient documentation

## 2021-07-09 DIAGNOSIS — W228XXA Striking against or struck by other objects, initial encounter: Secondary | ICD-10-CM | POA: Diagnosis not present

## 2021-07-09 DIAGNOSIS — Z043 Encounter for examination and observation following other accident: Secondary | ICD-10-CM | POA: Diagnosis not present

## 2021-07-09 DIAGNOSIS — I10 Essential (primary) hypertension: Secondary | ICD-10-CM | POA: Insufficient documentation

## 2021-07-09 DIAGNOSIS — S0083XA Contusion of other part of head, initial encounter: Secondary | ICD-10-CM

## 2021-07-09 DIAGNOSIS — M542 Cervicalgia: Secondary | ICD-10-CM | POA: Insufficient documentation

## 2021-07-09 DIAGNOSIS — Z7901 Long term (current) use of anticoagulants: Secondary | ICD-10-CM | POA: Insufficient documentation

## 2021-07-09 DIAGNOSIS — I2510 Atherosclerotic heart disease of native coronary artery without angina pectoris: Secondary | ICD-10-CM | POA: Diagnosis not present

## 2021-07-09 DIAGNOSIS — W1839XA Other fall on same level, initial encounter: Secondary | ICD-10-CM | POA: Insufficient documentation

## 2021-07-09 DIAGNOSIS — S199XXA Unspecified injury of neck, initial encounter: Secondary | ICD-10-CM | POA: Diagnosis not present

## 2021-07-09 DIAGNOSIS — R102 Pelvic and perineal pain: Secondary | ICD-10-CM | POA: Diagnosis not present

## 2021-07-09 DIAGNOSIS — T1490XA Injury, unspecified, initial encounter: Secondary | ICD-10-CM

## 2021-07-09 DIAGNOSIS — S0003XA Contusion of scalp, initial encounter: Secondary | ICD-10-CM | POA: Diagnosis not present

## 2021-07-09 DIAGNOSIS — I517 Cardiomegaly: Secondary | ICD-10-CM | POA: Diagnosis not present

## 2021-07-09 LAB — SAMPLE TO BLOOD BANK

## 2021-07-09 LAB — CBC
HCT: 42.2 % (ref 39.0–52.0)
Hemoglobin: 13.7 g/dL (ref 13.0–17.0)
MCH: 30.6 pg (ref 26.0–34.0)
MCHC: 32.5 g/dL (ref 30.0–36.0)
MCV: 94.4 fL (ref 80.0–100.0)
Platelets: 228 10*3/uL (ref 150–400)
RBC: 4.47 MIL/uL (ref 4.22–5.81)
RDW: 13.8 % (ref 11.5–15.5)
WBC: 7 10*3/uL (ref 4.0–10.5)
nRBC: 0 % (ref 0.0–0.2)

## 2021-07-09 LAB — ETHANOL: Alcohol, Ethyl (B): <10 mg/dL (ref <10)

## 2021-07-09 LAB — URINALYSIS, ROUTINE W REFLEX MICROSCOPIC
Bilirubin Urine: NEGATIVE
Glucose, UA: NEGATIVE mg/dL
Hgb urine dipstick: NEGATIVE
Ketones, ur: NEGATIVE mg/dL
Leukocytes,Ua: NEGATIVE
Nitrite: NEGATIVE
Protein, ur: NEGATIVE mg/dL
Specific Gravity, Urine: 1.01 (ref 1.005–1.030)
pH: 6.5 (ref 5.0–8.0)

## 2021-07-09 LAB — COMPREHENSIVE METABOLIC PANEL
ALT: 42 U/L (ref 0–44)
AST: 38 U/L (ref 15–41)
Albumin: 3.7 g/dL (ref 3.5–5.0)
Alkaline Phosphatase: 79 U/L (ref 38–126)
Anion gap: 12 (ref 5–15)
BUN: 12 mg/dL (ref 8–23)
CO2: 25 mmol/L (ref 22–32)
Calcium: 9.3 mg/dL (ref 8.9–10.3)
Chloride: 102 mmol/L (ref 98–111)
Creatinine, Ser: 1.2 mg/dL (ref 0.61–1.24)
GFR, Estimated: >60 mL/min (ref >60)
Glucose, Bld: 109 mg/dL — ABNORMAL HIGH (ref 70–99)
Potassium: 4.1 mmol/L (ref 3.5–5.1)
Sodium: 139 mmol/L (ref 135–145)
Total Bilirubin: 0.7 mg/dL (ref 0.3–1.2)
Total Protein: 6.9 g/dL (ref 6.5–8.1)

## 2021-07-09 LAB — I-STAT CHEM 8, ED
BUN: 16 mg/dL (ref 8–23)
Calcium, Ion: 1.14 mmol/L — ABNORMAL LOW (ref 1.15–1.40)
Chloride: 103 mmol/L (ref 98–111)
Creatinine, Ser: 1.1 mg/dL (ref 0.61–1.24)
Glucose, Bld: 108 mg/dL — ABNORMAL HIGH (ref 70–99)
HCT: 44 % (ref 39.0–52.0)
Hemoglobin: 15 g/dL (ref 13.0–17.0)
Potassium: 4.2 mmol/L (ref 3.5–5.1)
Sodium: 140 mmol/L (ref 135–145)
TCO2: 29 mmol/L (ref 22–32)

## 2021-07-09 LAB — PROTIME-INR
INR: 1.8 — ABNORMAL HIGH (ref 0.8–1.2)
Prothrombin Time: 20.7 seconds — ABNORMAL HIGH (ref 11.4–15.2)

## 2021-07-09 LAB — LACTIC ACID, PLASMA: Lactic Acid, Venous: 2 mmol/L (ref 0.5–1.9)

## 2021-07-09 LAB — APTT: aPTT: 32 seconds (ref 24–36)

## 2021-07-09 MED ORDER — DIPHENHYDRAMINE HCL 25 MG PO CAPS
25.0000 mg | ORAL_CAPSULE | Freq: Once | ORAL | Status: AC
  Administered 2021-07-09: 25 mg via ORAL
  Filled 2021-07-09: qty 1

## 2021-07-09 MED ORDER — METOCLOPRAMIDE HCL 5 MG/ML IJ SOLN
10.0000 mg | Freq: Once | INTRAMUSCULAR | Status: AC
  Administered 2021-07-09: 10 mg via INTRAVENOUS
  Filled 2021-07-09: qty 2

## 2021-07-09 NOTE — ED Provider Notes (Signed)
Accepted handoff at shift change from [facility]. Please see prior provider note for more detail.   Briefly: Patient is 76 y.o.   "[name] is a 76 y.o. male. PMH of HTN, A fib, CAD, Hx of CVA, GERD, COPD, movement disorder, on warfarin. Patient presented to the ED after losing his balance and falling backwards and hitting the rear side of his head on a dresser. He did not lose consciousness. He only complains of neck pain and a headache. He has no numbness, weakness, vision changes, nausea, vomiting, or other arthralgias. He has a hx of frequent falls due to his movement disorder."  DDX: concern for intracranial hemorrhage.  Plan: FU on CT imaging and labs     Physical Exam  BP (!) 169/92    Pulse 62    Temp 97.7 F (36.5 C) (Oral)    Resp 18    SpO2 98%   Physical Exam  Procedures  Procedures  ED Course / MDM    Medical Decision Making Amount and/or Complexity of Data Reviewed Labs: ordered. Radiology: ordered.  Risk Prescription drug management.    UA unremarkable  INR only slightly elevated (states he hasn't taken warfarin tomorrow)  Lact marg elevated at 2. CMP unremarkable.   I independently reviewed imaging.  Plain films of chest and pelvis unremarkable. IMPRESSION:  1. No acute intracranial abnormality.  2. Soft tissue hematoma of the right posterior scalp.  3. No evidence of acute cervical spine fracture or traumatic  malalignment.   Patient reassessed he states he has no complaints at this time apart from an achy headache that is moderate to mild.  Given Reglan Benadryl.  They are requesting discharge at this time.  He remains neurologically intact following commands feels well.  Discharged home with strict return precautions.    Pati Gallo [place], [place] 07/09/21 Lurena Nida    Regan Lemming, MD 07/09/21 1925

## 2021-07-09 NOTE — ED Provider Notes (Signed)
[facility] EMERGENCY DEPARTMENT Provider Note   CSN: [id number] Arrival date & time: 07/09/21  1403     History  Chief Complaint  Patient presents with   [name]    [name] is a 76 y.o. male. PMH of HTN, A fib, CAD, Hx of CVA, GERD, COPD, movement disorder, on warfarin. Patient presented to the ED after losing his balance and falling backwards and hitting the rear side of his head on a dresser. He did not lose consciousness. He only complains of neck pain and a headache. He has no numbness, weakness, vision changes, nausea, vomiting, or other arthralgias. He has a hx of frequent falls due to his movement disorder.    Fall Associated symptoms include headaches.      Home Medications Prior to Admission medications   Medication Sig Start Date End Date Taking? Authorizing Provider  acetaminophen (TYLENOL) 325 MG tablet Take 2 tablets (650 mg total) by mouth every 6 (six) hours as needed for mild pain or moderate pain. 01/14/21   Sharion Settler, DO  Alirocumab (PRALUENT) 75 MG/ML SOAJ Inject 75 mg into the skin every 14 (fourteen) days. 07/09/20   Minna Merritts, MD  amiodarone (PACERONE) 200 MG tablet Take 1 tablet (200 mg total) by mouth daily. 11/25/20   Jettie Booze, MD  amiodarone (PACERONE) 200 MG tablet Take 1 tablet (200 mg total) by mouth daily. 04/08/21   Jettie Booze, MD  amLODipine (NORVASC) 2.5 MG tablet Take 1 tablet (2.5 mg total) by mouth daily. 06/20/21   Jettie Booze, MD  aspirin 81 MG tablet Take 81 mg by mouth daily with supper.    [provider]  bisoprolol (ZEBETA) 10 MG tablet Take 0.5 tablets (5 mg total) by mouth daily. 04/14/21   [name], MD  bisoprolol (ZEBETA) 5 MG tablet Take 0.5 tablets (2.5 mg total) by mouth daily. 01/15/21   Sharion Settler, DO  citalopram (CELEXA) 40 MG tablet TAKE 1 TABLET BY MOUTH EVERY DAY 06/06/21   [name], MD  diphenhydramine-acetaminophen (TYLENOL PM)  25-500 MG TABS tablet Take 2 tablets by mouth at bedtime as needed (leg pain).    [provider]  fluticasone (FLONASE) 50 MCG/ACT nasal spray Place 1 spray into both nostrils at bedtime as needed (stuffy nose). 05/20/20   [provider]  lisinopril (ZESTRIL) 40 MG tablet Take 1 tablet (40 mg total) by mouth daily with supper. For blood pressure. 01/14/21   Sharion Settler, DO  Multiple Vitamins-Minerals (MULTIVITAMIN WITH MINERALS) tablet Take 1 tablet by mouth daily with supper. Centrum Silver    [provider]  nitroGLYCERIN (NITROSTAT) 0.4 MG SL tablet Place 1 tablet (0.4 mg total) every 5 (five) minutes as needed under the tongue for chest pain. 04/16/17   Leone Haven, MD  omeprazole (PRILOSEC) 40 MG capsule TAKE 1 CAPSULE (40 MG TOTAL) BY MOUTH 2 (TWO) TIMES DAILY BEFORE A MEAL. FOR HEARTBURN. 04/11/21   [name], MD  ondansetron (ZOFRAN-ODT) 4 MG disintegrating tablet TAKE 1 TABLET BY MOUTH EVERY 8 HOURS AS NEEDED FOR NAUSEA AND VOMITING 03/20/21   [name], MD  pramipexole (MIRAPEX) 0.25 MG tablet Take 1 tablet (0.25 mg total) by mouth at bedtime. 07/02/21   Tat, Eustace Quail, DO  pravastatin (PRAVACHOL) 80 MG tablet Take 1 tablet (80 mg total) by mouth daily. For cholesterol. 03/22/20   Minna Merritts, MD  psyllium (HYDROCIL/METAMUCIL) 95 % PACK Take 1 packet by mouth daily.  [provider]  senna (SENOKOT) 8.6 MG TABS tablet Take 1 tablet (8.6 mg total) by mouth daily. For constipation 03/07/21   [name], MD  solifenacin (VESICARE) 5 MG tablet Take 5 mg by mouth daily. 06/20/21   [provider]  tiZANidine (ZANAFLEX) 4 MG tablet TAKE 1 TABLET BY MOUTH AT BEDTIME AS NEEDED FOR MUSCLE SPASMS. 07/02/21   Ezequiel Essex, MD  triamcinolone cream (KENALOG) 0.1 % Apply 1 application topically daily as needed (rash around nose).    [provider]  vitamin B-12 (CYANOCOBALAMIN) 1000 MCG tablet Take 1,000 mcg by  mouth daily with supper.    [provider]  warfarin (COUMADIN) 7.5 MG tablet Take 1 tablet (7.5mg  ) by mouth on Sun and Thurs and all other days take 1/2 tablet (3.75mg ) at supper time. 06/03/21   Jettie Booze, MD      Allergies    Bactrim [sulfamethoxazole-trimethoprim], Ciprofloxacin, and Crestor [rosuvastatin calcium]    Review of Systems   Review of Systems  Musculoskeletal:  Positive for neck pain.  Neurological:  Positive for headaches.  All other systems reviewed and are negative.  Physical Exam Updated Vital Signs BP (!) 167/97    Pulse (!) 59    Temp 97.7 F (36.5 C) (Oral)    Resp 15    SpO2 97%  Physical Exam Vitals and nursing note reviewed.  Constitutional:      General: He is not in acute distress.    Appearance: Normal appearance. He is not ill-appearing, toxic-appearing or diaphoretic.  HENT:     Head: Normocephalic.     Comments: Hematoma noted to occiput region of head    Right Ear: Tympanic membrane, ear canal and external ear normal.     Left Ear: Tympanic membrane, ear canal and external ear normal.     Ears:     Comments: No evidence of hemotympanum bilaterally    Nose: Nose normal. No nasal deformity.     Mouth/Throat:     Lips: Pink. No lesions.     Mouth: Mucous membranes are moist. No injury, lacerations, oral lesions or angioedema.     Pharynx: Oropharynx is clear. Uvula midline. No pharyngeal swelling, oropharyngeal exudate, posterior oropharyngeal erythema or uvula swelling.  Eyes:     General: Gaze aligned appropriately. No scleral icterus.       Right eye: No discharge.        Left eye: No discharge.     Extraocular Movements: Extraocular movements intact.     Conjunctiva/sclera: Conjunctivae normal.     Right eye: Right conjunctiva is not injected. No exudate or hemorrhage.    Left eye: Left conjunctiva is not injected. No exudate or hemorrhage.    Pupils: Pupils are equal, round, and reactive to light.  Neck:      Comments: There is some cervical midline ttp but no stepoffs Cardiovascular:     Rate and Rhythm: Normal rate and regular rhythm.     Pulses: Normal pulses.          Radial pulses are 2+ on the right side and 2+ on the left side.       Dorsalis pedis pulses are 2+ on the right side and 2+ on the left side.     Heart sounds: Normal heart sounds, S1 normal and S2 normal. Heart sounds not distant. No murmur heard.   No friction rub. No gallop. No S3 or S4 sounds.  Pulmonary:     Effort: Pulmonary effort  is normal. No accessory muscle usage or respiratory distress.     Breath sounds: Normal breath sounds. No stridor. No wheezing, rhonchi or rales.  Chest:     Chest wall: No tenderness.  Abdominal:     General: Abdomen is flat. Bowel sounds are normal. There is no distension.     Palpations: Abdomen is soft. There is no mass or pulsatile mass.     Tenderness: There is no abdominal tenderness. There is no right CVA tenderness, left CVA tenderness, guarding or rebound.  Musculoskeletal:     Cervical back: Normal range of motion. Tenderness present.     Right lower leg: No edema.     Left lower leg: No edema.     Comments: No T or L spine ttp or stepoffs noted  Skin:    General: Skin is warm and dry.     Coloration: Skin is not jaundiced or pale.     Findings: No bruising, erythema, lesion or rash.  Neurological:     General: No focal deficit present.     Mental Status: He is alert and oriented to person, place, and time.     GCS: GCS eye subscore is 4. GCS verbal subscore is 5. GCS motor subscore is 6.     Comments: Alert and Oriented x 3 Speech clear with no aphasia Cranial Nerve testing - PERRLA. EOM intact. No Nystagmus - Facial Sensation grossly intact - No facial asymmetry - Uvula and Tongue Midline - Accessory Muscles intact Motor: - 5/5 motor strength in all four extremities.  - Normal tone Sensation: - Sensation slightly less in RUE than in LUE, however per patient, this is  his baseline from his previous stroke Coordination:  - Finger to nose and heel to shin intact bilaterally   Psychiatric:        Mood and Affect: Mood normal.        Behavior: Behavior normal. Behavior is cooperative.    ED Results / Procedures / Treatments   Labs (all labs ordered are listed, but only abnormal results are displayed) Labs Reviewed  COMPREHENSIVE METABOLIC PANEL - Abnormal; Notable for the following components:      Result Value   Glucose, Bld 109 (*)    All other components within normal limits  LACTIC ACID, PLASMA - Abnormal; Notable for the following components:   Lactic Acid, Venous 2.0 (*)    All other components within normal limits  PROTIME-INR - Abnormal; Notable for the following components:   Prothrombin Time 20.7 (*)    INR 1.8 (*)    All other components within normal limits  I-STAT CHEM 8, ED - Abnormal; Notable for the following components:   Glucose, Bld 108 (*)    Calcium, Ion 1.14 (*)    All other components within normal limits  RESP PANEL BY RT-PCR (FLU A&B, COVID) ARPGX2  CBC  ETHANOL  APTT  URINALYSIS, ROUTINE W REFLEX MICROSCOPIC  SAMPLE TO BLOOD BANK    EKG EKG Interpretation  Date/Time:  Wednesday July 09 2021 14:27:34 EST Ventricular Rate:  62 PR Interval:  202 QRS Duration: 94 QT Interval:  463 QTC Calculation: 471 R Axis:   59 Text Interpretation: Sinus or ectopic atrial rhythm Confirmed by Lennice Sites ([id number]) on 07/09/2021 3:01:17 PM  Radiology CT HEAD WO CONTRAST (5MM)  Result Date: 07/09/2021 CLINICAL DATA:  Trauma EXAM: CT HEAD WITHOUT CONTRAST CT CERVICAL SPINE WITHOUT CONTRAST TECHNIQUE: Multidetector CT imaging of the head and cervical spine was performed following  the standard protocol without intravenous contrast. Multiplanar CT image reconstructions of the cervical spine were also generated. RADIATION DOSE REDUCTION: This exam was performed according to the departmental dose-optimization program which includes  automated exposure control, adjustment of the mA and/or kV according to patient size and/or use of iterative reconstruction technique. COMPARISON:  CT dated November 13, 2020 FINDINGS: CT HEAD FINDINGS Brain: No evidence of acute infarction, hemorrhage, hydrocephalus, extra-axial collection or mass lesion/mass effect. Vascular: No hyperdense vessel or unexpected calcification. Skull: Normal. Negative for fracture or focal lesion. Sinuses/Orbits: No acute finding. Other: Soft tissue hematoma of the right posterior scalp. CT CERVICAL SPINE FINDINGS Alignment: Normal. Skull base and vertebrae: No acute fracture. No primary bone lesion or focal pathologic process. Soft tissues and spinal canal: No prevertebral fluid or swelling. No visible canal hematoma. Disc levels:  Mild degenerative disc disease. Upper chest: Negative. Other: None. IMPRESSION: 1. No acute intracranial abnormality. 2. Soft tissue hematoma of the right posterior scalp. 3. No evidence of acute cervical spine fracture or traumatic malalignment. Electronically Signed   By: Yetta Glassman M.D.   On: 07/09/2021 15:30   CT Cervical Spine Wo Contrast  Result Date: 07/09/2021 CLINICAL DATA:  Trauma EXAM: CT HEAD WITHOUT CONTRAST CT CERVICAL SPINE WITHOUT CONTRAST TECHNIQUE: Multidetector CT imaging of the head and cervical spine was performed following the standard protocol without intravenous contrast. Multiplanar CT image reconstructions of the cervical spine were also generated. RADIATION DOSE REDUCTION: This exam was performed according to the departmental dose-optimization program which includes automated exposure control, adjustment of the mA and/or kV according to patient size and/or use of iterative reconstruction technique. COMPARISON:  CT dated November 13, 2020 FINDINGS: CT HEAD FINDINGS Brain: No evidence of acute infarction, hemorrhage, hydrocephalus, extra-axial collection or mass lesion/mass effect. Vascular: No hyperdense vessel or unexpected  calcification. Skull: Normal. Negative for fracture or focal lesion. Sinuses/Orbits: No acute finding. Other: Soft tissue hematoma of the right posterior scalp. CT CERVICAL SPINE FINDINGS Alignment: Normal. Skull base and vertebrae: No acute fracture. No primary bone lesion or focal pathologic process. Soft tissues and spinal canal: No prevertebral fluid or swelling. No visible canal hematoma. Disc levels:  Mild degenerative disc disease. Upper chest: Negative. Other: None. IMPRESSION: 1. No acute intracranial abnormality. 2. Soft tissue hematoma of the right posterior scalp. 3. No evidence of acute cervical spine fracture or traumatic malalignment. Electronically Signed   By: Yetta Glassman M.D.   On: 07/09/2021 15:30   DG Pelvis Portable  Result Date: 07/09/2021 CLINICAL DATA:  Bilateral hip pain following a fall. EXAM: PORTABLE PELVIS 1-2 VIEWS COMPARISON:  None. FINDINGS: Mild left and minimal right hip degenerative changes. No fracture or dislocation seen. Mild lower lumbar spine degenerative changes. Left groin surgical clips. Atheromatous arterial calcifications. IMPRESSION: No fracture or dislocation seen. Electronically Signed   By: Claudie Revering M.D.   On: 07/09/2021 15:04   DG Chest Port 1 View  Result Date: 07/09/2021 CLINICAL DATA:  [place]. EXAM: PORTABLE CHEST 1 VIEW COMPARISON:  05/08/2020 FINDINGS: The cardiac silhouette remains borderline enlarged. Clear lungs with normal vascularity. Diffuse osteopenia. No fracture or pneumothorax seen. IMPRESSION: No acute abnormality. Electronically Signed   By: Claudie Revering M.D.   On: 07/09/2021 15:03    Procedures Procedures    Medications Ordered in ED Medications - No data to display  ED Course/ Medical Decision Making/ A&P  Medical Decision Making Problems Addressed: Trauma: acute illness or injury  Amount and/or Complexity of Data Reviewed Independent Historian: caregiver    Details: patient does give some of  the history Labs: ordered. Decision-making details documented in ED Course. Radiology: ordered and independent interpretation performed. Decision-making details documented in ED Course.   This is a 76 y.o. male with a PMH of HTN, A fib, CAD, Hx of CVA, GERD, COPD, movement disorder, on warfarin who presents to the ED with mechanical fall and resulting head trauma on thinners.  Vitals reassuring. Exam neurologically intact. No other concerning injuries.  Plan to obtain CT head, cervical spine, and INR/PT Other labs and imaging obtained in triage.   4:05 PM Care of Gen Clagg  transferred to [facility] and Dr. Armandina Gemma at the end of my shift as the patient will require reassessment once labs/imaging have resulted. Patient presentation, ED course, and plan of care discussed with review of all pertinent labs and imaging. Please see his/her note for further details regarding further ED course and disposition. Plan at time of handoff is f/u on imaging, likely discharge home if no concerns found. This may be altered or completely changed at the discretion of the oncoming team pending results of further workup.   I have seen and evaluated this patient in conjunction with my attending physician who agrees and has made changes to the plan accordingly.  Portions of this note were generated with [employer]. Dictation errors may occur despite best attempts at proofreading.  Final Clinical Impression(s) / ED Diagnoses Final diagnoses:  Trauma    Rx / DC Orders ED Discharge Orders     None         Adolphus Birchwood, PA-C 07/09/21 [place], [place], DO 07/09/21 1709

## 2021-07-09 NOTE — Progress Notes (Signed)
Orthopedic Tech Progress Note Patient Details:  [name] 04-05-1946 [id number]  Level 2 trauma  Patient ID: [name], male   DOB: 01/09/1946, 76 y.o.   MRN: [id number]  [name] 07/09/2021, 2:40 PM

## 2021-07-09 NOTE — ED Notes (Signed)
MSE completed   Carlisle Cater, PA-C 07/09/21 1411

## 2021-07-09 NOTE — ED Provider Triage Note (Signed)
Emergency Medicine Provider Triage Evaluation Note  [name] , a 76 y.o. male  was evaluated in triage.  Pt complains of head injury.  He is on Coumadin.  He lost his balance and fell approximately 1 PM.  He has a hematoma to the back of his head.  He denies other pain or injuries.  Review of Systems  Positive: Scalp contusion, headache Negative: Extremity pain  Physical Exam  BP (!) 158/93 (BP Location: Left Arm)    Pulse 61    Temp 98.8 F (37.1 C) (Oral)    Resp 18    SpO2 97%  Gen:   Awake, no distress   Resp:  Normal effort  MSK:   Moves extremities without difficulty  Other:  Posterior scalp hematoma noted, no active bleeding or laceration  Medical Decision Making  Medically screening exam initiated at 2:09 PM.  Appropriate orders placed.  [name] was informed that the remainder of the evaluation will be completed by another provider, this initial triage assessment does not replace that evaluation, and the importance of remaining in the ED until their evaluation is complete.     William Cater, PA-C 07/09/21 1410

## 2021-07-09 NOTE — Progress Notes (Signed)
Responded to ED page to support patient.  Patient is sitting up in bed. Family at bedside. Pt said he was doing better. Provided emotional support.  Will follow as needed.  Jaclynn Major, [place], [facility], Pager [phone]

## 2021-07-09 NOTE — ED Provider Notes (Signed)
Patient here after mechanical fall at home.  This is a shared visit.  PA primarily saw the patient.  Arrives as a level 2 trauma because he is a fall on Coumadin.  Mechanical fall.  History of neuromuscular disorder where he has chronic gait issues.  He does ambulate with a walker.  He lost his balance and hit the back of his head on dresser.  He has a large hematoma to the back of his head.  There is no laceration or bleeding.  He denies any extremity pain or midline spinal pain.  We will get head CT and neck CT.  Basic labs including INR have been ordered.  Patient handed off to oncoming ED staff.  Please see their note for further results, evaluation, disposition of the patient.  This chart was dictated using voice recognition software.  Despite best efforts to proofread,  errors can occur which can change the documentation meaning.    Lennice Sites, DO 07/09/21 1506

## 2021-07-09 NOTE — Discharge Instructions (Addendum)
Please follow-up with your cardiologist/primary care provider to recheck INR.  Please continue take your medications as prescribed.  As we discussed CT imaging is a very good imaging modality however should you develop any numbness or weakness in your arms you should certainly return to the emergency room.  Otherwise you may schedule a follow-up appointment for your primary care in the next 2 to 3 days for reassessment.

## 2021-07-10 ENCOUNTER — Telehealth (HOSPITAL_COMMUNITY): Payer: Self-pay

## 2021-07-10 NOTE — Telephone Encounter (Signed)
2nd attempt to contact patient to schedule OP MBS - left voicemail. ?

## 2021-07-11 ENCOUNTER — Ambulatory Visit (INDEPENDENT_AMBULATORY_CARE_PROVIDER_SITE_OTHER): Payer: PPO | Admitting: *Deleted

## 2021-07-11 ENCOUNTER — Other Ambulatory Visit: Payer: Self-pay

## 2021-07-11 ENCOUNTER — Ambulatory Visit: Payer: PPO | Attending: Podiatry

## 2021-07-11 VITALS — BP 140/82 | HR 58

## 2021-07-11 DIAGNOSIS — Z5181 Encounter for therapeutic drug level monitoring: Secondary | ICD-10-CM | POA: Diagnosis not present

## 2021-07-11 DIAGNOSIS — R296 Repeated falls: Secondary | ICD-10-CM | POA: Diagnosis not present

## 2021-07-11 DIAGNOSIS — R2681 Unsteadiness on feet: Secondary | ICD-10-CM | POA: Insufficient documentation

## 2021-07-11 DIAGNOSIS — R2689 Other abnormalities of gait and mobility: Secondary | ICD-10-CM | POA: Insufficient documentation

## 2021-07-11 DIAGNOSIS — M6281 Muscle weakness (generalized): Secondary | ICD-10-CM | POA: Insufficient documentation

## 2021-07-12 NOTE — Therapy (Signed)
[facility] [address] [place], [place], [address] Phone: [phone]   Fax:  [phone]  Physical Therapy Treatment  Patient Details  Name: Keynan Heffern MRN: [id number] Date of Birth: 1946/04/15 Referring Provider (PT): Andria Frames Jamal Collin, MD; [name], MD   Encounter Date: 07/11/2021   PT End of Session - 07/11/21 1356     Visit Number 9    Number of Visits 17    Date for PT Re-Evaluation 07/24/21    Authorization Type [employer]    Progress Note Due on Visit 10    PT Start Time 1355    PT Stop Time 1448    PT Time Calculation (min) 53 min    Equipment Utilized During Treatment Gait belt    Activity Tolerance Patient tolerated treatment well    Behavior During Therapy [facility] for tasks assessed/performed             Past Medical History:  Diagnosis Date   BPH (benign prostatic hyperplasia)    COPD (chronic obstructive pulmonary disease) ([place]) 2021   Coronary artery disease    a. s/p PCI to LCx in 2007; b. MV 2017 no sig ischemia, EF 59%, nl study. c. Cath 01/2020 without significant recurrent dz.   Depression    GERD (gastroesophageal reflux disease)    Hyperlipidemia    Hypertension    IBS (irritable bowel syndrome)    Myocardial infarction ([place])    12-13 yrs ago    PAF (paroxysmal atrial fibrillation) ([place])    a. CHADS2VASc => 5 (HTN, age x 1, stroke x 2, vascular disease); b. on Coumadin    PNA (pneumonia)    Skin cancer    face    Sleep apnea    off cpap    Stroke ([place])    a. x 2 with residual left-sided weakness    Past Surgical History:  Procedure Laterality Date   [place]  2006   X1 STENT   COLONOSCOPY     CORONARY ANGIOPLASTY     POLYPECTOMY     RIGHT/LEFT HEART CATH AND CORONARY ANGIOGRAPHY N/A 01/25/2020   Procedure: RIGHT/LEFT HEART CATH AND CORONARY ANGIOGRAPHY;  Surgeon: Minna Merritts, MD;  Location: [facility];  Service: Cardiovascular;  Laterality: N/A;   SKIN CANCER EXCISION N/A 08/2016   [place].  [facility] (Dr. Deliah Boston)   THYROIDECTOMY, PARTIAL     UPPER GASTROINTESTINAL ENDOSCOPY     VASCULAR SURGERY     varicose vein stripping left leg    Vitals:   07/11/21 1401  BP: 140/82  Pulse: (!) 58     Subjective Assessment - 07/11/21 1356     Subjective Pt had a fall backwards last Wednesday and hit his head. Had been walking in room with walker and when tried to open bureau he fell backward.  He had to go to hospital and imaging was all clear. Was given something for pain and benadryl. Wife reports that when he got home he slept until 4 the next day. He also fell on Sunday when wife was at church. Was on floor for 2 hours. Pt reports that with fall on Sunday he was reaching for a piece of clothing at edge of bed and slide down. Pt still has goose egg on right posterior head that is tender. They did get the peddler bike but have not been able to utilize. They have lowered bed off  box spring since fall and bed is now 20" height.    Patient is accompained by: Family member    Pertinent History BPH, COPD, CAD, depression, HLD, HTN, IBS, MI, PAF, OSA, skin CA, CVA with L sided residual weakness, coronary angioplasty    Limitations Walking;Standing    Patient Stated Goals To be able to walk with a cane; to improve balance - pt falls backwards    Currently in Pain? No/denies   only pain when touches back of head                              [facility] Adult PT Treatment/Exercise - 07/11/21 1405       Transfers   Transfers Sit to Stand;Stand to Sit    Sit to Stand 5: Supervision;4: Min guard    Stand to Sit 5: Supervision;4: Min guard;4: Min assist    Stand to Sit Details (indicate cue type and reason) Verbal cues for technique    Stand to Sit Details Pt was cued to bend at hips to lean forward prior to sitting back to get more control and decrease posterior  lean.    Comments Sit to stands x 5, sit to stand with walking 3 steps forward and then backing up and sitting x 5 with verbal instruction to lean slightly forward when taking steps back and slow down to decrease posterior lean. Assistance varied from mod to CGA towards end as leaning posterior prior to sitting. Walking forward 8' around cone and back x 2. Verbal cues and demonstration to take time with turning picking up feet light march to decrease short shuffled steps which did help. All activities performed with RW with wife observing throughout.      Ambulation/Gait   Ambulation/Gait Yes    Ambulation/Gait Assistance 4: Min assist;4: Min guard    Ambulation/Gait Assistance Details Theraband tied at front of walker to give visual cue to increase step length. Pt leaning posterior at trunk initially so assistance level varied. Short walk to/from SciFit towards end of session less posterior lean noted after activities and more CGA.    Ambulation Distance (Feet) 115 Feet    Assistive device Rolling walker    Gait Pattern Step-through pattern;Decreased step length - right;Decreased step length - left    Ambulation Surface Level;Indoor      Neuro Re-ed    Neuro Re-ed Details  Standing at walker: working on posterior stepping x 10 each foot CGA. Again cued to lean forward slightly at trunk to prevent posterior lean.      Exercises   Exercises Other Exercises;Knee/Hip      Knee/Hip Exercises: Aerobic   Other Aerobic SciFit x 3 min level 2 at end of session. BP=130/80 prior and 140/80 after. Pt reported that he was tired at end of session.                     PT Education - 07/12/21 1409     Education Details Pt and wife instructed for pt not to be up on own. Wife needs to be with him when up due to increase in posterior lean and high fall risk    Person(s) Educated Patient;Spouse    Methods Explanation    Comprehension Verbalized understanding              PT Short Term  Goals - 06/25/21 1417       PT SHORT TERM GOAL #  1   Title Pt will demonstrate ability to perform initial HEP safely with supervision.    Baseline pt intermittently performs at home depending on fatigue level.    Time 4    Period Weeks    Status Partially Met    Target Date 06/24/21      PT SHORT TERM GOAL #2   Title Pt will demonstrate ability to perform sit <> stand from variety of surfaces with consistent supervision without posterior LOB    Baseline no posterior LOB    Time 4    Period Weeks    Status Achieved    Target Date 06/24/21      PT SHORT TERM GOAL #3   Title Pt will improve Mini-Best Test by 4 points to indicate decreased falls risk    Baseline 13    Time 4    Period Weeks    Status Deferred    Target Date 06/24/21      PT SHORT TERM GOAL #4   Title Pt will demonstrate <100% difference between TUG and COG TUG to indicate improved safety/balance during dual tasking    Baseline 120% difference; TUG 26.38 seconds, cog TUG 45.63    Time 4    Period Weeks    Status Not Met    Target Date 06/24/21      PT SHORT TERM GOAL #5   Title Patient will negotiate 12 stairs with one rail and cane safely with supervision    Baseline Min A for safety, did not have time to perform    Time 4    Period Weeks    Status Deferred    Target Date 06/24/21      PT SHORT TERM GOAL #6   Title Pt will improve gait speed with RW vs. LRAD to at least 1.3 Ft/sec in order to demo decr fall risk/improved community mobility.    Baseline .69 FT/SEC with RW; .98 ft/sec with RW on 06/25/21    Time 4    Period Weeks    Status Not Met               PT Long Term Goals - 05/26/21 1633       PT LONG TERM GOAL #1   Title Pt will demonstrate ability to perform final HEP with supervision    Time 8    Period Weeks    Status New    Target Date 07/24/21      PT LONG TERM GOAL #2   Title Pt will demonstrate improvement in Mini-Best Test to >/= 20 points to indicate decreased falls risk     Time 8    Period Weeks    Status New    Target Date 07/24/21      PT LONG TERM GOAL #3   Title Pt will demonstrate WFL TUG and will demonstrate <80% difference between TUG and COG TUG to indicate improved safety during dual task    Time 8    Period Weeks    Status New    Target Date 07/24/21      PT LONG TERM GOAL #4   Title Pt will ambulate 230' over level surfaces without AD with supervision    Baseline ambulates with cane    Time 8    Period Weeks    Status New    Target Date 07/24/21      PT LONG TERM GOAL #5   Title Pt will consistently perform sit <> stand transfers MOD  I without posterior LOB    Time 8    Period Weeks    Status New    Target Date 07/24/21      Additional Long Term Goals   Additional Long Term Goals Yes      PT LONG TERM GOAL #6   Title Pt will improve gait speed with RW vs. LRAD to at least 2.0 ft/sec in order to demo decr fall risk/improved community mobility.    Baseline .93 ft/sec with RW    Time 8    Period Weeks    Status New                   Plan - 07/12/21 1411     Clinical Impression Statement Pt with 2 recent falls at home (1 triggered ER visit due to hitting head). All scans clear but does have small goose egg at right posterior head with some brusing down around back of ear. Pt was noted to have increased posterior lean with all activities today. Focused on trying to slow down movements and have him lean forward some to compensate especially with back up to sit.    Personal Factors and Comorbidities Comorbidity 3+;Past/Current Experience;Time since onset of injury/illness/exacerbation    Comorbidities BPH, COPD, CAD, depression, HLD, HTN, IBS, MI, PAF, OSA, skin CA, CVA with L sided residual weakness, coronary angioplasty, falls, movement disorder - diagnosis undetermined    Examination-Activity Limitations Locomotion Level;Stairs;Stand;Transfers    Examination-Participation Restrictions Community Activity    Rehab  Potential Good    PT Frequency 2x / week    PT Duration 8 weeks    PT Treatment/Interventions ADLs/Self Care Home Management;DME Instruction;Gait training;Stair training;Functional mobility training;Therapeutic activities;Therapeutic exercise;Balance training;Neuromuscular re-education;Orthotic Fit/Training;Patient/family education;Passive range of motion    PT Next Visit Plan 10th visit progress note. look at stairs. add posterior stepping to HEP if can safely perform with wife.  monitor BP.gait with RW/endurance and use of red tband as visual cue. Balance reaction training-posterior LOB, SLS, weight shifting. BLE strengthening. SciFit/NuStep.    PT Home Exercise Plan [id number]    Consulted and Agree with Plan of Care Patient;Family member/caregiver             Patient will benefit from skilled therapeutic intervention in order to improve the following deficits and impairments:  Abnormal gait, Decreased balance, Decreased strength, Difficulty walking, Pain  Visit Diagnosis: Other abnormalities of gait and mobility  Muscle weakness (generalized)  Unsteadiness on feet     Problem List Patient Active Problem List   Diagnosis Date Noted   Insect bite 04/15/2021   Drug-induced tremor 03/20/2021   Depression, recurrent ([place]) 01/18/2021   Urinary frequency 01/18/2021   Partial small bowel obstruction ([place]) 01/13/2021   Thyroid nodule 12/23/2020   Suspected Movement disorder- possibly PD  12/22/2020   Hematoma- Paraspinal from Fall  12/22/2020   Frequent falls 11/13/2020   Orthostatic dizziness 08/28/2020   Statin myopathy 06/07/2019   Chronic obstructive pulmonary disease ([place]) 06/13/2018   Sleep apnea 09/16/2017   GERD (gastroesophageal reflux disease) 02/08/2015   IBS (irritable bowel syndrome) 02/08/2015   Essential hypertension 02/08/2015   AF (paroxysmal atrial fibrillation) ([place]) 01/23/2015   CAD (coronary artery disease) 01/23/2015   History of CVA (cerebrovascular  accident) 01/23/2015    Electa Sniff, PT, DPT, NCS 07/12/2021, 2:14 PM  [facility] [address] [place], [place], [address] Phone: [phone]   Fax:  [phone]  Name: Anan Dapolito MRN:  [id number] Date of Birth: 11-09-1945

## 2021-07-16 ENCOUNTER — Other Ambulatory Visit: Payer: Self-pay

## 2021-07-16 ENCOUNTER — Encounter: Payer: Self-pay | Admitting: Physical Therapy

## 2021-07-16 ENCOUNTER — Encounter: Payer: Self-pay | Admitting: Neurology

## 2021-07-16 ENCOUNTER — Other Ambulatory Visit: Payer: Self-pay | Admitting: Family Medicine

## 2021-07-16 ENCOUNTER — Ambulatory Visit: Payer: PPO | Admitting: Physical Therapy

## 2021-07-16 DIAGNOSIS — R2689 Other abnormalities of gait and mobility: Secondary | ICD-10-CM

## 2021-07-16 DIAGNOSIS — I10 Essential (primary) hypertension: Secondary | ICD-10-CM

## 2021-07-16 DIAGNOSIS — R296 Repeated falls: Secondary | ICD-10-CM

## 2021-07-16 DIAGNOSIS — R2681 Unsteadiness on feet: Secondary | ICD-10-CM

## 2021-07-16 DIAGNOSIS — M6281 Muscle weakness (generalized): Secondary | ICD-10-CM

## 2021-07-17 ENCOUNTER — Other Ambulatory Visit (HOSPITAL_COMMUNITY): Payer: Self-pay | Admitting: *Deleted

## 2021-07-17 DIAGNOSIS — R1310 Dysphagia, unspecified: Secondary | ICD-10-CM

## 2021-07-17 NOTE — Therapy (Addendum)
[facility] [address] [place], [place], [address] Phone: [phone]   Fax:  [phone]  Physical Therapy Treatment/10th Visit Progress Note  Patient Details  Name: [name] MRN: [id number] Date of Birth: 10-24-1945 Referring Provider (PT): Andria Frames Jamal Collin, MD; [name], MD   10th Visit Physical Therapy Progress Note  Dates of Reporting Period: 05/23/21 to 07/16/21    Encounter Date: 07/16/2021   PT End of Session - 07/16/21 1409     Visit Number 10    Number of Visits 17    Date for PT Re-Evaluation 07/24/21    Authorization Type [employer]    Progress Note Due on Visit 10    PT Start Time 2202    PT Stop Time 5427    PT Time Calculation (min) 45 min    Equipment Utilized During Treatment Gait belt    Activity Tolerance Patient tolerated treatment well;Patient limited by fatigue    Behavior During Therapy [facility] for tasks assessed/performed             Past Medical History:  Diagnosis Date   BPH (benign prostatic hyperplasia)    COPD (chronic obstructive pulmonary disease) ([place]) 2021   Coronary artery disease    a. s/p PCI to LCx in 2007; b. MV 2017 no sig ischemia, EF 59%, nl study. c. Cath 01/2020 without significant recurrent dz.   Depression    GERD (gastroesophageal reflux disease)    Hyperlipidemia    Hypertension    IBS (irritable bowel syndrome)    Myocardial infarction ([place])    12-13 yrs ago    PAF (paroxysmal atrial fibrillation) ([place])    a. CHADS2VASc => 5 (HTN, age x 1, stroke x 2, vascular disease); b. on Coumadin    PNA (pneumonia)    Skin cancer    face    Sleep apnea    off cpap    Stroke ([place])    a. x 2 with residual left-sided weakness    Past Surgical History:  Procedure Laterality Date   [place]  2006   X1 STENT   COLONOSCOPY     CORONARY ANGIOPLASTY     POLYPECTOMY     RIGHT/LEFT HEART CATH AND CORONARY  ANGIOGRAPHY N/A 01/25/2020   Procedure: RIGHT/LEFT HEART CATH AND CORONARY ANGIOGRAPHY;  Surgeon: Minna Merritts, MD;  Location: [facility];  Service: Cardiovascular;  Laterality: N/A;   SKIN CANCER EXCISION N/A 08/2016   [place].  [facility] (Dr. Deliah Boston)   THYROIDECTOMY, PARTIAL     UPPER GASTROINTESTINAL ENDOSCOPY     VASCULAR SURGERY     varicose vein stripping left leg    There were no vitals filed for this visit.   Subjective Assessment - 07/16/21 1404     Subjective Had another fall since he was last here. Was standing up from the toilet to stand at his RW and lost his balance halfway and fell back against the toilet. No neurological changes, just pain and a bruise. Wife reports that his walking has gotten worse.    Patient is accompained by: Family member    Pertinent History BPH, COPD, CAD, depression, HLD, HTN, IBS, MI, PAF, OSA, skin CA, CVA with L sided residual weakness, coronary angioplasty    Limitations Walking;Standing    Patient Stated Goals To be able to walk with a cane; to improve balance - pt falls backwards    Currently in  Pain? Yes    Pain Score 4     Pain Location Leg    Pain Orientation Right;Left    Pain Descriptors / Indicators Aching    Pain Type Acute pain    Aggravating Factors  Trying to stand.    Pain Relieving Factors Tylenol                            07/18/21 0834  Transfers  Transfers Sit to Stand;Stand to Sit  Sit to Stand 5: Supervision;4: Min guard  Stand to Sit 5: Supervision;4: Min guard;4: Min assist  Stand to Sit Details (indicate cue type and reason) Verbal cues for technique;Visual cues for safe use of DME/AE;Visual cues/gestures for precautions/safety;Visual cues/gestures for sequencing  Stand to Sit Details Cued to back all the way to mat table and feel BLE against mat table, then hinge from hips and reach back each time to sit down for improved control.  Comments Sit to stands x10  reps performed with cues first to scoot out towards edge, have feet tucked under, and wide BOS. Cued to have one hand on RW and one hand on mat table (vs. pushing with BUE from mat) in order to help decr retropulsion. Pt reporting feeling more steady performing this way. Wife observing and educated to make sure she is with him at all times for transfers.  Ambulation/Gait  Ambulation/Gait Yes  Ambulation/Gait Assistance 4: Min assist;4: Min guard  Ambulation/Gait Assistance Details With RW, if visual cue of tband wasn't working trialed using another technique such as counting steps to try to get to a target. Worked on counting between each 10' of lines on floor in therapy gym. Needing cues for maintaing incr step length and wide BOS (esp going around curves when pt needing min A at times for balance due to feet getting too close together). Pt reporting sometimes he feels like he is "stuck" and can't move his left leg. Discussed and demonstrated technique of making sure he has a wide BOS (stepping out with RLE), stopping to reset and then gently rocking to help re-initiate movement again. Pt reporting that this seemed to help. Wife present for gait and proper cueing throughout session.  Ambulation Distance (Feet) 115 Feet (x2)  Assistive device Rolling walker  Gait Pattern Step-through pattern;Decreased step length - right;Decreased step length - left  Ambulation Surface Level;Indoor  Gait Comments Discussed with pt and pt's wife making sure that he has a gait belt on at home for safety with gait/transfers.                PT Education - 07/17/21 1338     Education Details Transfer training, pt's spouse to be with pt at all times during gait and with transfers. Discussed potentially adding more visits - possibly 1x week for 4 weeks, will further discuss at next visit and PT to check when there are available afternoon slots on schedule.              PT Short Term Goals - 06/25/21 1417        PT SHORT TERM GOAL #1   Title Pt will demonstrate ability to perform initial HEP safely with supervision.    Baseline pt intermittently performs at home depending on fatigue level.    Time 4    Period Weeks    Status Partially Met    Target Date 06/24/21      PT SHORT TERM GOAL #  2   Title Pt will demonstrate ability to perform sit <> stand from variety of surfaces with consistent supervision without posterior LOB    Baseline no posterior LOB    Time 4    Period Weeks    Status Achieved    Target Date 06/24/21      PT SHORT TERM GOAL #3   Title Pt will improve Mini-Best Test by 4 points to indicate decreased falls risk    Baseline 13    Time 4    Period Weeks    Status Deferred    Target Date 06/24/21      PT SHORT TERM GOAL #4   Title Pt will demonstrate <100% difference between TUG and COG TUG to indicate improved safety/balance during dual tasking    Baseline 120% difference; TUG 26.38 seconds, cog TUG 45.63    Time 4    Period Weeks    Status Not Met    Target Date 06/24/21      PT SHORT TERM GOAL #5   Title Patient will negotiate 12 stairs with one rail and cane safely with supervision    Baseline Min A for safety, did not have time to perform    Time 4    Period Weeks    Status Deferred    Target Date 06/24/21      PT SHORT TERM GOAL #6   Title Pt will improve gait speed with RW vs. LRAD to at least 1.3 Ft/sec in order to demo decr fall risk/improved community mobility.    Baseline .25 FT/SEC with RW; .98 ft/sec with RW on 06/25/21    Time 4    Period Weeks    Status Not Met               PT Long Term Goals - 05/26/21 1633       PT LONG TERM GOAL #1   Title Pt will demonstrate ability to perform final HEP with supervision    Time 8    Period Weeks    Status New    Target Date 07/24/21      PT LONG TERM GOAL #2   Title Pt will demonstrate improvement in Mini-Best Test to >/= 20 points to indicate decreased falls risk    Time 8    Period  Weeks    Status New    Target Date 07/24/21      PT LONG TERM GOAL #3   Title Pt will demonstrate WFL TUG and will demonstrate <80% difference between TUG and COG TUG to indicate improved safety during dual task    Time 8    Period Weeks    Status New    Target Date 07/24/21      PT LONG TERM GOAL #4   Title Pt will ambulate 230' over level surfaces without AD with supervision    Baseline ambulates with cane    Time 8    Period Weeks    Status New    Target Date 07/24/21      PT LONG TERM GOAL #5   Title Pt will consistently perform sit <> stand transfers MOD I without posterior LOB    Time 8    Period Weeks    Status New    Target Date 07/24/21      Additional Long Term Goals   Additional Long Term Goals Yes      PT LONG TERM GOAL #6   Title Pt will improve gait speed  with RW vs. LRAD to at least 2.0 ft/sec in order to demo decr fall risk/improved community mobility.    Baseline .93 ft/sec with RW    Time 8    Period Weeks    Status New                07/18/21 1213  Plan  Clinical Impression Statement 10th visit PN: Pt has recently seen neurologist (who reports that PSP can't be ruled out) Pt with 3 recent falls (1 triggered ER visit due to hitting head). All scans clear, but still does have some brusing behind R ear. All falls occurred in the backwards direction when pt was trying to perform transfers or ambulate on his own. Discussed for safety to make sure that pt has his wife with him at all times for safety. Worked on gait training today with RW with pt needing min guard and up to min A at times when going around curves (due to pt having too narrow of a BOS). Pt needing min guard for sit <> stand transfers today with cues for safety and to help decr retropulsion. Pt continues to need cues to reach posteriorly and hinge from hips before sitting for incr safety. Will continue to work on gait, transfers, functional mobility, balance strategies, care giver  education/training in order to improve functional mobility/safety and decr fall risk.  Personal Factors and Comorbidities Comorbidity 3+;Past/Current Experience;Time since onset of injury/illness/exacerbation  Comorbidities BPH, COPD, CAD, depression, HLD, HTN, IBS, MI, PAF, OSA, skin CA, CVA with L sided residual weakness, coronary angioplasty, falls, movement disorder - diagnosis undetermined  Examination-Activity Limitations Locomotion Level;Stairs;Stand;Transfers  Examination-Participation Restrictions Community Activity  Pt will benefit from skilled therapeutic intervention in order to improve on the following deficits Abnormal gait;Decreased balance;Decreased strength;Difficulty walking;Pain  Rehab Potential Good  PT Frequency 2x / week  PT Duration 8 weeks  PT Treatment/Interventions ADLs/Self Care Home Management;DME Instruction;Gait training;Stair training;Functional mobility training;Therapeutic activities;Therapeutic exercise;Balance training;Neuromuscular re-education;Orthotic Fit/Training;Patient/family education;Passive range of motion  PT Next Visit Plan Safety with transfers. try weight to RW/U step ? add posterior stepping to HEP if can safely perform with wife.  monitor BP.gait with RW/endurance and use of red tband as visual cue. Balance reaction training-posterior LOB, SLS, weight shifting. BLE strengthening. SciFit/NuStep.  PT Home Exercise Plan [id number]  Consulted and Agree with Plan of Care Patient;Family member/caregiver        Patient will benefit from skilled therapeutic intervention in order to improve the following deficits and impairments:     Visit Diagnosis: Other abnormalities of gait and mobility  Muscle weakness (generalized)  Repeated falls  Unsteadiness on feet     Problem List Patient Active Problem List   Diagnosis Date Noted   Insect bite 04/15/2021   Drug-induced tremor 03/20/2021   Depression, recurrent ([place]) 01/18/2021   Urinary  frequency 01/18/2021   Partial small bowel obstruction ([place]) 01/13/2021   Thyroid nodule 12/23/2020   Suspected Movement disorder- possibly PD  12/22/2020   Hematoma- Paraspinal from Fall  12/22/2020   Frequent falls 11/13/2020   Orthostatic dizziness 08/28/2020   Statin myopathy 06/07/2019   Chronic obstructive pulmonary disease ([place]) 06/13/2018   Sleep apnea 09/16/2017   GERD (gastroesophageal reflux disease) 02/08/2015   IBS (irritable bowel syndrome) 02/08/2015   Essential hypertension 02/08/2015   AF (paroxysmal atrial fibrillation) ([place]) 01/23/2015   CAD (coronary artery disease) 01/23/2015   History of CVA (cerebrovascular accident) 01/23/2015    Arliss Journey, PT, DPT  07/17/2021, 1:41  PM  [facility] [address] [place], [place], [address] Phone: [phone]   Fax:  [phone]  Name: [name] MRN: [id number] Date of Birth: [birth year]

## 2021-07-18 ENCOUNTER — Ambulatory Visit: Payer: PPO | Admitting: Physical Therapy

## 2021-07-18 ENCOUNTER — Encounter: Payer: Self-pay | Admitting: Physical Therapy

## 2021-07-18 ENCOUNTER — Other Ambulatory Visit: Payer: Self-pay

## 2021-07-18 DIAGNOSIS — R296 Repeated falls: Secondary | ICD-10-CM

## 2021-07-18 DIAGNOSIS — F32A Depression, unspecified: Secondary | ICD-10-CM

## 2021-07-18 DIAGNOSIS — M6281 Muscle weakness (generalized): Secondary | ICD-10-CM

## 2021-07-18 DIAGNOSIS — R2681 Unsteadiness on feet: Secondary | ICD-10-CM

## 2021-07-18 DIAGNOSIS — R2689 Other abnormalities of gait and mobility: Secondary | ICD-10-CM

## 2021-07-18 NOTE — Therapy (Addendum)
[facility] [address] [place], [place], [address] Phone: [phone]   Fax:  [phone]  Physical Therapy Treatment  Patient Details  Name: [name] MRN: [id number] Date of Birth: 21-May-1946 Referring Provider (PT): Andria Frames Jamal Collin, MD; [name], MD   Encounter Date: 07/18/2021   07/18/21 1404  PT Visits / Re-Eval  Visit Number 11  Number of Visits 17  Date for PT Re-Evaluation 07/24/21  Authorization  Authorization Type [employer]  Progress Note Due on Visit 10  PT Time Calculation  PT Start Time 1401  PT Stop Time 1324  PT Time Calculation (min) 46 min  PT - End of Session  Equipment Utilized During Treatment Gait belt  Activity Tolerance Patient tolerated treatment well;Patient limited by fatigue  Behavior During Therapy [facility] for tasks assessed/performed     Past Medical History:  Diagnosis Date   BPH (benign prostatic hyperplasia)    COPD (chronic obstructive pulmonary disease) ([place]) 2021   Coronary artery disease    a. s/p PCI to LCx in 2007; b. MV 2017 no sig ischemia, EF 59%, nl study. c. Cath 01/2020 without significant recurrent dz.   Depression    GERD (gastroesophageal reflux disease)    Hyperlipidemia    Hypertension    IBS (irritable bowel syndrome)    Myocardial infarction ([place])    12-13 yrs ago    PAF (paroxysmal atrial fibrillation) ([place])    a. CHADS2VASc => 5 (HTN, age x 1, stroke x 2, vascular disease); b. on Coumadin    PNA (pneumonia)    Skin cancer    face    Sleep apnea    off cpap    Stroke ([place])    a. x 2 with residual left-sided weakness    Past Surgical History:  Procedure Laterality Date   [place]  2006   X1 STENT   COLONOSCOPY     CORONARY ANGIOPLASTY     POLYPECTOMY     RIGHT/LEFT HEART CATH AND CORONARY ANGIOGRAPHY N/A 01/25/2020   Procedure: RIGHT/LEFT HEART CATH AND CORONARY ANGIOGRAPHY;   Surgeon: Minna Merritts, MD;  Location: [facility];  Service: Cardiovascular;  Laterality: N/A;   SKIN CANCER EXCISION N/A 08/2016   [place].  [facility] (Dr. Deliah Boston)   THYROIDECTOMY, PARTIAL     UPPER GASTROINTESTINAL ENDOSCOPY     VASCULAR SURGERY     varicose vein stripping left leg    There were no vitals filed for this visit.   Subjective Assessment - 07/18/21 1404     Subjective No falls. Pt's wife reports that they rearranged some furniture to make it easier for pt to get up from a higher chair.    Patient is accompained by: Family member    Pertinent History BPH, COPD, CAD, depression, HLD, HTN, IBS, MI, PAF, OSA, skin CA, CVA with L sided residual weakness, coronary angioplasty    Limitations Walking;Standing    Patient Stated Goals To be able to walk with a cane; to improve balance - pt falls backwards    Currently in Pain? No/denies                                07/18/21 1542  Transfers  Transfers Sit to Stand;Stand to Sit  Sit to Stand 4: Min guard  Stand to Sit 4: Min guard  Stand to Sit  Details (indicate cue type and reason) Verbal cues for technique;Visual cues for safe use of DME/AE;Visual cues/gestures for precautions/safety;Visual cues/gestures for sequencing  Stand to Sit Details Cued to back up all the way to mat table and to hinge from hips to reach back to sit down.  Comments Performed x5 reps sit <> stands with single UE support on mat table and other on RW with pt needing cues each time for proper technique - scooting towards edge, wider BOS and then incr nose over toes to stand. Tried putting 2 5 lb ankle weights on anterior part of RW to help counterbalance due to pt's retropulsion. Pt reporting feeling more steady with this on RW. Performed an additional x5 reps simulating a softer surface such as a couch (having pt seated on thicker blue foam), with PT providing cues to pt and pt's spouse to use the count  of 3 and momentum with incr forward lean to stand. Pt and pt's spouse verbalizing understanding. Worked taking ~3 steps forward with RW and then 3 steps backwards back to mat table to practice backing up x3 reps with cues for hip hinge when stepping back for counter balance. Pt with incr difficulty with this and difficulty moving LLE back.  Ambulation/Gait  Ambulation/Gait Yes  Ambulation/Gait Assistance 4: Min guard;4: Min assist  Ambulation/Gait Assistance Details With RW, small distance in clinic. Used two 5# ankle weights on RW to help steady it due to retropulsion. Verbal cues to use external stimuli (such as yellow tband) for incr step length to band. Pt with more difficulty stepping today with LLE as has a tendency to drag. Intermittent cues to stop and reset with tall posture and have wide BOS (stepping R leg out to the side) and performing gentle weight shifting before taking a step. Pt reports that this seems to help. Also reports feeling more steady with these weights on the front of the RW. Pt's wife plans to start by buying two 5-7 lb ankle weights to put on front of his RW at home.  Ambulation Distance (Feet) 80 Feet (x1)  Assistive device Rolling walker  Gait Pattern Step-through pattern;Decreased step length - right;Decreased step length - left  Ambulation Surface Level;Indoor  Therapeutic Activites   Therapeutic Activities Other Therapeutic Activities  Other Therapeutic Activities Provided PSP resources to pt and pt's spouse ([email or web address] as pt's spouse reports they were not given any kind of information regarding this diagnosis after recent visit to pt's neurologist, scheduling going forwards and doing 1x week, pt needing to go on waitlist and able to get scheduled in a couple weeks due to PT's schedule being full.      07/18/21 1543  Balance Exercises: Standing  Stepping Strategy Posterior;Limitations  Stepping Strategy Limitations On outside of // bars with BUE support, working on  taking a step backwards with hip hinge with pt needing max verbal/demo cues on how to perform. Pt with tendency to take a more narrow step back and then stepping back to midline with feet together. Cued to think about a diagonal step forwards/backwards. Pt with incr challenge with this and unable to tell when his feet were together. When stepping back to midline, pt with incr lean to the L, needing min A at times for balance. Discussed making sure that pt is not performing at home as he is not safe to at this time.       PT Short Term Goals - 06/25/21 1417       PT SHORT TERM  GOAL #1   Title Pt will demonstrate ability to perform initial HEP safely with supervision.    Baseline pt intermittently performs at home depending on fatigue level.    Time 4    Period Weeks    Status Partially Met    Target Date 06/24/21      PT SHORT TERM GOAL #2   Title Pt will demonstrate ability to perform sit <> stand from variety of surfaces with consistent supervision without posterior LOB    Baseline no posterior LOB    Time 4    Period Weeks    Status Achieved    Target Date 06/24/21      PT SHORT TERM GOAL #3   Title Pt will improve Mini-Best Test by 4 points to indicate decreased falls risk    Baseline 13    Time 4    Period Weeks    Status Deferred    Target Date 06/24/21      PT SHORT TERM GOAL #4   Title Pt will demonstrate <100% difference between TUG and COG TUG to indicate improved safety/balance during dual tasking    Baseline 120% difference; TUG 26.38 seconds, cog TUG 45.63    Time 4    Period Weeks    Status Not Met    Target Date 06/24/21      PT SHORT TERM GOAL #5   Title Patient will negotiate 12 stairs with one rail and cane safely with supervision    Baseline Min A for safety, did not have time to perform    Time 4    Period Weeks    Status Deferred    Target Date 06/24/21      PT SHORT TERM GOAL #6   Title Pt will improve gait speed with RW vs. LRAD to at least 1.3  Ft/sec in order to demo decr fall risk/improved community mobility.    Baseline .48 FT/SEC with RW; .98 ft/sec with RW on 06/25/21    Time 4    Period Weeks    Status Not Met               PT Long Term Goals - 07/18/21 1544       PT LONG TERM GOAL #1   Title Pt will demonstrate ability to perform final HEP with supervision    Baseline pt has been too fatigued to perform HEP, performs when he can with his spouse.    Time 8    Period Weeks    Status Partially Met    Target Date 07/24/21      PT LONG TERM GOAL #2   Title Pt will demonstrate improvement in Mini-Best Test to >/= 20 points to indicate decreased falls risk    Baseline not safe to perform miniBEST at this time.    Time 8    Period Weeks    Status Deferred    Target Date 07/24/21      PT LONG TERM GOAL #3   Title Pt will demonstrate WFL TUG and will demonstrate <80% difference between TUG and COG TUG to indicate improved safety during dual task    Baseline did not have time to assess.    Time 8    Period Weeks    Status Deferred    Target Date 07/24/21      PT LONG TERM GOAL #4   Title Pt will ambulate 230' over level surfaces without AD with supervision    Baseline needs RW with  min guard/min A    Time 8    Period Weeks    Status Not Met    Target Date 07/24/21      PT LONG TERM GOAL #5   Title Pt will consistently perform sit <> stand transfers MOD I without posterior LOB    Baseline needs supervision/min guard for proper technique.    Time 8    Period Weeks    Status Not Met    Target Date 07/24/21      PT LONG TERM GOAL #6   Title Pt will improve gait speed with RW vs. LRAD to at least 2.0 ft/sec in order to demo decr fall risk/improved community mobility.    Baseline .71 ft/sec with RW; did not have time to assess    Time 8    Period Weeks    Status Deferred                07/23/21 1603  Plan  Clinical Impression Statement Began to check pt's LTGs today. Pt has been inconsistent  with performing HEP at home due to fatigue levels, but tries to perform standing at countertop with wife and seated exercises when he can. Pt's spouse also bought him a peddle bike to use for strength/ROM. Due to a decline in pt's functional mobility/balance since eval, it is not safe to perform the miniBEST at this time. Pt did not meet LTGs #4 and #5. Pt needing min guard/min A for gait with RW as pt will sometimes lose his balance by having his feet too close together going around the curves, needing cues for wider BOS. Due to recent falls, pt needs to have spouse with him at all times during gait at home. Otherwise is in his w/c. PT needing min guard for sit <> stand transfers and cues for proper technique in order to decr retropulsion.  Pt more challenged by posterior stepping strategies today needing min A for balance due to narrow BOS and pt with difficulty sequencing. Pt has had a decline in his mobility and has had a few falls recently (pt falling in the backwards direction). Pt needing pt's spouse to be with him at all times for gait/transfers for safety. PT to send a message to Dr. Carles Collet about his decline in mobility since he was last seen by her.  Will assess remaining LTGs at next session and plan to re-cert.  Personal Factors and Comorbidities Comorbidity 3+;Past/Current Experience;Time since onset of injury/illness/exacerbation  Comorbidities BPH, COPD, CAD, depression, HLD, HTN, IBS, MI, PAF, OSA, skin CA, CVA with L sided residual weakness, coronary angioplasty, falls, movement disorder - diagnosis undetermined  Examination-Activity Limitations Locomotion Level;Stairs;Stand;Transfers  Examination-Participation Restrictions Community Activity  Pt will benefit from skilled therapeutic intervention in order to improve on the following deficits Abnormal gait;Decreased strength;Difficulty walking;Pain;Decreased activity tolerance;Decreased balance;Decreased coordination;Decreased endurance;Decreased  knowledge of use of DME;Decreased safety awareness;Impaired flexibility;Postural dysfunction  Rehab Potential Good  PT Frequency 2x / week  PT Duration 8 weeks  PT Treatment/Interventions ADLs/Self Care Home Management;DME Instruction;Gait training;Stair training;Functional mobility training;Therapeutic activities;Therapeutic exercise;Balance training;Neuromuscular re-education;Orthotic Fit/Training;Patient/family education;Passive range of motion;Vestibular;Energy conservation  PT Next Visit Plan re-cert. Safety with transfers. try weight to RW/U step ? add posterior stepping to HEP if can safely perform with wife.  monitor BP.gait with RW/endurance and use of red tband as visual cue. Balance reaction training-posterior LOB, SLS, weight shifting. BLE strengthening. SciFit/NuStep.  PT Home Exercise Plan [id number]  Consulted and Agree with Plan of Care Patient;Family member/caregiver  Patient will benefit from skilled therapeutic intervention in order to improve the following deficits and impairments:     Visit Diagnosis: Other abnormalities of gait and mobility  Muscle weakness (generalized)  Repeated falls  Unsteadiness on feet     Problem List Patient Active Problem List   Diagnosis Date Noted   Insect bite 04/15/2021   Drug-induced tremor 03/20/2021   Depression, recurrent ([place]) 01/18/2021   Urinary frequency 01/18/2021   Partial small bowel obstruction ([place]) 01/13/2021   Thyroid nodule 12/23/2020   Suspected Movement disorder- possibly PD  12/22/2020   Hematoma- Paraspinal from Fall  12/22/2020   Frequent falls 11/13/2020   Orthostatic dizziness 08/28/2020   Statin myopathy 06/07/2019   Chronic obstructive pulmonary disease ([place]) 06/13/2018   Sleep apnea 09/16/2017   GERD (gastroesophageal reflux disease) 02/08/2015   IBS (irritable bowel syndrome) 02/08/2015   Essential hypertension 02/08/2015   AF (paroxysmal atrial fibrillation) ([place]) 01/23/2015   CAD (coronary  artery disease) 01/23/2015   History of CVA (cerebrovascular accident) 01/23/2015    Arliss Journey, PT, DPT  07/18/2021, 3:46 PM  [facility] [address] [place], [place], [address] Phone: [phone]   Fax:  [phone]  Name: [name] MRN: [id number] Date of Birth: March 14, 1946

## 2021-07-22 ENCOUNTER — Ambulatory Visit (INDEPENDENT_AMBULATORY_CARE_PROVIDER_SITE_OTHER): Payer: PPO | Admitting: *Deleted

## 2021-07-22 ENCOUNTER — Other Ambulatory Visit: Payer: Self-pay

## 2021-07-22 DIAGNOSIS — R002 Palpitations: Secondary | ICD-10-CM | POA: Diagnosis not present

## 2021-07-22 DIAGNOSIS — Z7901 Long term (current) use of anticoagulants: Secondary | ICD-10-CM

## 2021-07-22 LAB — POCT INR: INR: 2.9 (ref 2.0–3.0)

## 2021-07-22 NOTE — Patient Instructions (Signed)
Description   Continue taking 1/2 tablet daily except for 1 tablet on Sundays and Thursdays. Recheck INR in 3 weeks. Coumadin Clinic [phone].

## 2021-07-23 ENCOUNTER — Ambulatory Visit (HOSPITAL_COMMUNITY)
Admission: RE | Admit: 2021-07-23 | Discharge: 2021-07-23 | Disposition: A | Discharge status: Home / Self Care | Payer: PPO | Source: Ambulatory Visit | Attending: Neurology | Admitting: Neurology

## 2021-07-23 ENCOUNTER — Encounter: Payer: Self-pay | Admitting: Physical Therapy

## 2021-07-23 ENCOUNTER — Ambulatory Visit: Payer: PPO | Admitting: Physical Therapy

## 2021-07-23 VITALS — BP 163/95 | HR 57

## 2021-07-23 DIAGNOSIS — M6281 Muscle weakness (generalized): Secondary | ICD-10-CM

## 2021-07-23 DIAGNOSIS — R296 Repeated falls: Secondary | ICD-10-CM

## 2021-07-23 DIAGNOSIS — R2681 Unsteadiness on feet: Secondary | ICD-10-CM

## 2021-07-23 DIAGNOSIS — R1310 Dysphagia, unspecified: Secondary | ICD-10-CM

## 2021-07-23 DIAGNOSIS — R2689 Other abnormalities of gait and mobility: Secondary | ICD-10-CM

## 2021-07-23 NOTE — Therapy (Signed)
Modified Barium Swallow Progress Note  Patient Details  Name: Derren Suydam MRN: [id number] Date of Birth: [birth year]  Today's Date: 07/23/2021  Modified Barium Swallow completed.  Full report located under Chart Review in the Imaging Section.  Brief recommendations include the following:  Clinical Impression   Patient presents with a mild oropharyngeal dysphagia as per this MBS. Epiglottis appeared rigid but adequately moved to protect airway during swallows. Consistencies tested included puree solids, thin liquids, regular solids and barium tablet. No instances of penetration or aspiration observed with any tested consistency. He exhibited a mild delay in mastication with regular solids and had difficulty with bolus cohesion with thin liquid and barium tablet. With thin liquids, swallow initiation delayed at level of vallecular sinus, but full clearance of bolus through pharynx and upper esophagus. Puree solids, regular solids and barium tablet all transited through pharynx and upper esophagus without difficulty. Prominent cricopharyngeal bar observed but did not impede bolus transit. Patient and wife both educated on results of MBS after.   Swallow Evaluation Recommendations     Regular solids, thin liquids Meds whole with liquids No f/u      Sonia Baller, MA, CCC-SLP Speech Therapy

## 2021-07-24 NOTE — Therapy (Addendum)
[facility] [address] [place], [place], [address] Phone: [phone]   Fax:  [phone]  Physical Therapy Treatment/Discharge Summary  Patient Details  Name: [name] MRN: [id number] Date of Birth: 08-19-1945 Referring Provider (PT): Andria Frames Jamal Collin, MD; [name], MD   Encounter Date: 07/23/2021   PT End of Session - 07/23/21 1618     Visit Number 12    Number of Visits 17    Date for PT Re-Evaluation 07/24/21    Authorization Type [employer]    Progress Note Due on Visit 10    PT Start Time 1615    PT Stop Time 1656    PT Time Calculation (min) 41 min    Equipment Utilized During Treatment Gait belt    Activity Tolerance Patient tolerated treatment well;Patient limited by fatigue    Behavior During Therapy [facility] for tasks assessed/performed             Past Medical History:  Diagnosis Date   BPH (benign prostatic hyperplasia)    COPD (chronic obstructive pulmonary disease) ([place]) 2021   Coronary artery disease    a. s/p PCI to LCx in 2007; b. MV 2017 no sig ischemia, EF 59%, nl study. c. Cath 01/2020 without significant recurrent dz.   Depression    GERD (gastroesophageal reflux disease)    Hyperlipidemia    Hypertension    IBS (irritable bowel syndrome)    Myocardial infarction ([place])    12-13 yrs ago    PAF (paroxysmal atrial fibrillation) ([place])    a. CHADS2VASc => 5 (HTN, age x 1, stroke x 2, vascular disease); b. on Coumadin    PNA (pneumonia)    Skin cancer    face    Sleep apnea    off cpap    Stroke ([place])    a. x 2 with residual left-sided weakness    Past Surgical History:  Procedure Laterality Date   [place]  2006   X1 STENT   COLONOSCOPY     CORONARY ANGIOPLASTY     POLYPECTOMY     RIGHT/LEFT HEART CATH AND CORONARY ANGIOGRAPHY N/A 01/25/2020   Procedure: RIGHT/LEFT HEART CATH AND CORONARY ANGIOGRAPHY;  Surgeon:  Minna Merritts, MD;  Location: [facility];  Service: Cardiovascular;  Laterality: N/A;   SKIN CANCER EXCISION N/A 08/2016   [place].  [facility] (Dr. Deliah Boston)   THYROIDECTOMY, PARTIAL     UPPER GASTROINTESTINAL ENDOSCOPY     VASCULAR SURGERY     varicose vein stripping left leg    Vitals:   07/23/21 1626  BP: (!) 163/95  Pulse: (!) 57     Subjective Assessment - 07/23/21 1618     Subjective Had a fall last week in the dentist. Was trying to get up by himself at the dentist and fell backwards. Hurt his back and his ribs.    Patient is accompained by: Family member    Pertinent History BPH, COPD, CAD, depression, HLD, HTN, IBS, MI, PAF, OSA, skin CA, CVA with L sided residual weakness, coronary angioplasty    Limitations Walking;Standing    Patient Stated Goals To be able to walk with a cane; to improve balance - pt falls backwards    Currently in Pain? Yes    Pain Score 7     Pain Location Rib cage    Pain Orientation Right    Pain Descriptors / Indicators Hervey Ard  Pain Type Acute pain    Aggravating Factors  Moving.    Pain Relieving Factors Not moving.                          Pt's spouse reports increased stress and financial burden (from bills from [facility] and hospitalization at [facility] last year) and would like today to be pt's last visit with pt in agreement. Pt had a recent fall and hurt his ribs and has not seen a PCP or gone to urgent care. Pt reports that the pain is getting better. Discussed that if pain is getting worse then pt needs to be seen by urgent care or PCP, both in agreement. Reviewed safety with transfers and gait due to pt's recent fall in the dentist office is that pt's wife has to be with pt at all times during all times during gait and when pt performs sit <> stands - pt not safe to be doing this on his own anymore. They have purchased two 5# ankle weights and put them on his RW at home which has helped some with  stability with sit <> stands. Attempted to review pt's standing HEP at the sink, but pt unable to pull to stand stand due to incr R rib discomfort and would not like to attempt again or review other exercises. Pt asking about questions/progression of PSP with PT giving education on PSP and what to expect. Also reviewed that Dr. Carles Collet mentioned potentially getting a 2nd opinion from [facility], but they are not interested in pursuing this. And reviewing [email or web address]://[facility]/ as a good resource. Also discussed in the future that if pt has incr difficulties with mobility, swallowing/speech or needs more assistance can reach out to Dr. Carles Collet and get a referral for PT/OT/ST (depending on pt's status maybe even home health). Discussed also reaching out to their social worker if they need help with any adaptive equipment needs. Both verbalized understanding.                    PT Long Term Goals - 07/18/21 1544       PT LONG TERM GOAL #1   Title Pt will demonstrate ability to perform final HEP with supervision    Baseline pt has been too fatigued to perform HEP, performs when he can with his spouse.    Time 8    Period Weeks    Status Partially Met    Target Date 07/24/21      PT LONG TERM GOAL #2   Title Pt will demonstrate improvement in Mini-Best Test to >/= 20 points to indicate decreased falls risk    Baseline not safe to perform miniBEST at this time.    Time 8    Period Weeks    Status Deferred    Target Date 07/24/21      PT LONG TERM GOAL #3   Title Pt will demonstrate WFL TUG and will demonstrate <80% difference between TUG and COG TUG to indicate improved safety during dual task    Baseline did not have time to assess.    Time 8    Period Weeks    Status Deferred    Target Date 07/24/21      PT LONG TERM GOAL #4   Title Pt will ambulate 230' over level surfaces without AD with supervision    Baseline needs RW with min guard/min A    Time 8    Period Weeks  Status Not Met    Target  Date 07/24/21      PT LONG TERM GOAL #5   Title Pt will consistently perform sit <> stand transfers MOD I without posterior LOB    Baseline needs supervision/min guard for proper technique.    Time 8    Period Weeks    Status Not Met    Target Date 07/24/21      PT LONG TERM GOAL #6   Title Pt will improve gait speed with RW vs. LRAD to at least 2.0 ft/sec in order to demo decr fall risk/improved community mobility.    Baseline .3 ft/sec with RW; did not have time to assess    Time 8    Period Weeks    Status Deferred             PHYSICAL THERAPY DISCHARGE SUMMARY  Visits from Start of Care: 12  Current functional level related to goals / functional outcomes: See clinical assessment statement/LTGs.   Pt needs min guard/min A with gait with RW and min guard with sit <> stands with RW. Pt needs wife to be with him at all times during standing, sit <> stands, gait for safety.   Remaining deficits: Impaired timing/coordination of gait, decr strength, postural instability, impaired balance, decr endurance, decr safety awareness.   Education / Equipment: HEP, fall prevention at home, safety with transfers/gait, PSP resources.    Patient agrees to discharge. Patient goals were not met. Patient is being discharged due to the patient's request.and pt's spouse request.        08/01/21 1621  Plan  Clinical Impression Statement Pt and pt's spouse wish for today to be pt's D/C visit. Unable to check remainder of LTGs. Pt has incr rib pain after a fall the other day and educated to make sure that if it continues/gets worse for pt to go to urgent care to get checked out. Educated on importance of making sure that pt's wife is there at all times for mobility due to pt's high fall risk and losing balance posteriorly. Discussed need for a new referral to return to PT in the future.  Personal Factors and Comorbidities Comorbidity 3+;Past/Current Experience;Time since onset of  injury/illness/exacerbation  Comorbidities BPH, COPD, CAD, depression, HLD, HTN, IBS, MI, PAF, OSA, skin CA, CVA with L sided residual weakness, coronary angioplasty, falls, movement disorder - diagnosis undetermined  Examination-Activity Limitations Locomotion Level;Stairs;Stand;Transfers  Examination-Participation Restrictions Community Activity  Pt will benefit from skilled therapeutic intervention in order to improve on the following deficits Abnormal gait;Decreased strength;Difficulty walking;Pain;Decreased activity tolerance;Decreased balance;Decreased coordination;Decreased endurance;Decreased knowledge of use of DME;Decreased safety awareness;Impaired flexibility;Postural dysfunction  Rehab Potential Good  PT Frequency 2x / week  PT Duration 8 weeks  PT Treatment/Interventions ADLs/Self Care Home Management;DME Instruction;Gait training;Stair training;Functional mobility training;Therapeutic activities;Therapeutic exercise;Balance training;Neuromuscular re-education;Orthotic Fit/Training;Patient/family education;Passive range of motion;Vestibular;Energy conservation  PT Next Visit Plan D/C  PT Home Exercise Plan [id number]  Consulted and Agree with Plan of Care Patient;Family member/caregiver     Patient will benefit from skilled therapeutic intervention in order to improve the following deficits and impairments:     Visit Diagnosis: Other abnormalities of gait and mobility  Muscle weakness (generalized)  Repeated falls  Unsteadiness on feet     Problem List Patient Active Problem List   Diagnosis Date Noted   Insect bite 04/15/2021   Drug-induced tremor 03/20/2021   Depression, recurrent ([place]) 01/18/2021   Urinary frequency 01/18/2021   Partial small bowel obstruction ([place])  01/13/2021   Thyroid nodule 12/23/2020   Suspected Movement disorder- possibly PD  12/22/2020   Hematoma- Paraspinal from Fall  12/22/2020   Frequent falls 11/13/2020   Orthostatic dizziness  08/28/2020   Statin myopathy 06/07/2019   Chronic obstructive pulmonary disease ([place]) 06/13/2018   Sleep apnea 09/16/2017   GERD (gastroesophageal reflux disease) 02/08/2015   IBS (irritable bowel syndrome) 02/08/2015   Essential hypertension 02/08/2015   AF (paroxysmal atrial fibrillation) ([place]) 01/23/2015   CAD (coronary artery disease) 01/23/2015   History of CVA (cerebrovascular accident) 01/23/2015    Arliss Journey, PT, DPT  07/24/2021, 5:27 PM  [facility] [address] [place], [place], [address] Phone: [phone]   Fax:  [phone]  Name: Cuahutemoc Attar MRN: [id number] Date of Birth: December 03, 1945

## 2021-07-29 ENCOUNTER — Other Ambulatory Visit: Payer: Self-pay

## 2021-08-01 ENCOUNTER — Other Ambulatory Visit: Payer: Self-pay | Admitting: Cardiovascular Disease

## 2021-08-01 DIAGNOSIS — D692 Other nonthrombocytopenic purpura: Secondary | ICD-10-CM | POA: Diagnosis not present

## 2021-08-01 DIAGNOSIS — I10 Essential (primary) hypertension: Secondary | ICD-10-CM | POA: Diagnosis not present

## 2021-08-01 DIAGNOSIS — I480 Paroxysmal atrial fibrillation: Secondary | ICD-10-CM | POA: Diagnosis not present

## 2021-08-01 DIAGNOSIS — I69354 Hemiplegia and hemiparesis following cerebral infarction affecting left non-dominant side: Secondary | ICD-10-CM | POA: Diagnosis not present

## 2021-08-01 DIAGNOSIS — E782 Mixed hyperlipidemia: Secondary | ICD-10-CM

## 2021-08-01 DIAGNOSIS — Z9989 Dependence on other enabling machines and devices: Secondary | ICD-10-CM | POA: Diagnosis not present

## 2021-08-01 DIAGNOSIS — Z993 Dependence on wheelchair: Secondary | ICD-10-CM | POA: Diagnosis not present

## 2021-08-01 DIAGNOSIS — Z87891 Personal history of nicotine dependence: Secondary | ICD-10-CM | POA: Diagnosis not present

## 2021-08-01 DIAGNOSIS — Z66 Do not resuscitate: Secondary | ICD-10-CM | POA: Diagnosis not present

## 2021-08-01 NOTE — Telephone Encounter (Signed)
Refill request

## 2021-08-02 ENCOUNTER — Other Ambulatory Visit: Payer: Self-pay | Admitting: Family Medicine

## 2021-08-07 ENCOUNTER — Telehealth: Payer: Self-pay | Admitting: Interventional Cardiology

## 2021-08-07 NOTE — Telephone Encounter (Signed)
Pt c/o medication issue: ? ?1. Name of Medication: [place] 65 MG/ML SOAJ ? ?2. How are you currently taking this medication (dosage and times per day)? Inject every 14 days ? ?3. Are you having a reaction (difficulty breathing--STAT)? no ? ?4. What is your medication issue? Patient's wife states they can afford tthe medication. She says he was taking a different medication, but his insurance switched and he had to start taking the praluent instead. She says it has changed again and would like to know if he can switch to the other medication or another one he can afford.  ?

## 2021-08-07 NOTE — Telephone Encounter (Signed)
PA SUBMITTED: ?Kenan Moodie (Key: [id number]) [phone] ?Praluent 75MG /ML auto-injectors ?Status: PA Request ?Created: March 2nd, 2023 ?Sent: March 2nd, 2023 ? ?Provided healthwell information to the pharmacy with pt on the call: ? ?Pt voiced understanding.  ? ?PATIENT ?[name] ?  ?STATUS  ?Active ?  ?START DATE ?07/08/2021 ?  ?END DATE ?07/07/2022 ?  ?ASSISTANCE TYPE ?Co-pay ?  ?PAID ?$0.00 ?  ?PENDING ?$0.00 ?  ?BALANCE ?$2500.00 ?Pharmacy Card ?CARD NO. ?[id number] ?  ?CARD STATUS ?Active ?  ?BIN ?[id number] ?  ?PCN ?[id number] ?  ?PC GROUP ?[id number] ?  ?HELP DESK ?[id number] ?  ?PROVIDER ?PDMI ?  ?PROCESSOR ?PDMI ? ?

## 2021-08-11 ENCOUNTER — Encounter: Payer: Self-pay | Admitting: Interventional Cardiology

## 2021-08-12 ENCOUNTER — Ambulatory Visit (INDEPENDENT_AMBULATORY_CARE_PROVIDER_SITE_OTHER): Payer: PPO

## 2021-08-12 ENCOUNTER — Other Ambulatory Visit: Payer: Self-pay

## 2021-08-12 DIAGNOSIS — Z7901 Long term (current) use of anticoagulants: Secondary | ICD-10-CM

## 2021-08-12 DIAGNOSIS — R002 Palpitations: Secondary | ICD-10-CM

## 2021-08-12 LAB — POCT INR: INR: 2.4 (ref 2.0–3.0)

## 2021-08-12 NOTE — Patient Instructions (Signed)
Description   ?Continue on same dosage of Warfarin 1/2 tablet daily except for 1 tablet on Sundays and Thursdays. Recheck INR in 4 weeks. [facility] [phone].  ?  ?  ?

## 2021-08-15 ENCOUNTER — Ambulatory Visit: Payer: PPO | Admitting: Psychologist

## 2021-08-15 ENCOUNTER — Emergency Department (HOSPITAL_COMMUNITY): Payer: PPO

## 2021-08-15 ENCOUNTER — Encounter (HOSPITAL_COMMUNITY): Payer: Self-pay | Admitting: *Deleted

## 2021-08-15 ENCOUNTER — Other Ambulatory Visit: Payer: Self-pay

## 2021-08-15 ENCOUNTER — Inpatient Hospital Stay (HOSPITAL_COMMUNITY)
Admission: EM | Admit: 2021-08-15 | Discharge: 2021-08-28 | DRG: 521 | Disposition: A | Discharge status: Skilled Nursing Facility | Payer: PPO | Attending: Family Medicine | Admitting: Family Medicine

## 2021-08-15 DIAGNOSIS — N179 Acute kidney failure, unspecified: Secondary | ICD-10-CM | POA: Diagnosis not present

## 2021-08-15 DIAGNOSIS — Z87891 Personal history of nicotine dependence: Secondary | ICD-10-CM

## 2021-08-15 DIAGNOSIS — Z79899 Other long term (current) drug therapy: Secondary | ICD-10-CM

## 2021-08-15 DIAGNOSIS — Z8249 Family history of ischemic heart disease and other diseases of the circulatory system: Secondary | ICD-10-CM

## 2021-08-15 DIAGNOSIS — Z789 Other specified health status: Secondary | ICD-10-CM | POA: Diagnosis not present

## 2021-08-15 DIAGNOSIS — Y95 Nosocomial condition: Secondary | ICD-10-CM | POA: Diagnosis not present

## 2021-08-15 DIAGNOSIS — R296 Repeated falls: Secondary | ICD-10-CM | POA: Diagnosis present

## 2021-08-15 DIAGNOSIS — G231 Progressive supranuclear ophthalmoplegia [Steele-Richardson-Olszewski]: Secondary | ICD-10-CM | POA: Diagnosis present

## 2021-08-15 DIAGNOSIS — I480 Paroxysmal atrial fibrillation: Secondary | ICD-10-CM | POA: Diagnosis present

## 2021-08-15 DIAGNOSIS — I10 Essential (primary) hypertension: Secondary | ICD-10-CM | POA: Diagnosis present

## 2021-08-15 DIAGNOSIS — S72012A Unspecified intracapsular fracture of left femur, initial encounter for closed fracture: Secondary | ICD-10-CM | POA: Diagnosis not present

## 2021-08-15 DIAGNOSIS — J440 Chronic obstructive pulmonary disease with acute lower respiratory infection: Secondary | ICD-10-CM | POA: Diagnosis not present

## 2021-08-15 DIAGNOSIS — Z7901 Long term (current) use of anticoagulants: Secondary | ICD-10-CM

## 2021-08-15 DIAGNOSIS — W19XXXA Unspecified fall, initial encounter: Secondary | ICD-10-CM | POA: Diagnosis not present

## 2021-08-15 DIAGNOSIS — S72002D Fracture of unspecified part of neck of left femur, subsequent encounter for closed fracture with routine healing: Secondary | ICD-10-CM

## 2021-08-15 DIAGNOSIS — Z20822 Contact with and (suspected) exposure to covid-19: Secondary | ICD-10-CM | POA: Diagnosis present

## 2021-08-15 DIAGNOSIS — S72042A Displaced fracture of base of neck of left femur, initial encounter for closed fracture: Secondary | ICD-10-CM | POA: Diagnosis not present

## 2021-08-15 DIAGNOSIS — Z85828 Personal history of other malignant neoplasm of skin: Secondary | ICD-10-CM

## 2021-08-15 DIAGNOSIS — Z515 Encounter for palliative care: Secondary | ICD-10-CM

## 2021-08-15 DIAGNOSIS — M6259 Muscle wasting and atrophy, not elsewhere classified, multiple sites: Secondary | ICD-10-CM | POA: Diagnosis not present

## 2021-08-15 DIAGNOSIS — Z419 Encounter for procedure for purposes other than remedying health state, unspecified: Secondary | ICD-10-CM

## 2021-08-15 DIAGNOSIS — Z7982 Long term (current) use of aspirin: Secondary | ICD-10-CM

## 2021-08-15 DIAGNOSIS — I252 Old myocardial infarction: Secondary | ICD-10-CM | POA: Diagnosis not present

## 2021-08-15 DIAGNOSIS — S299XXA Unspecified injury of thorax, initial encounter: Secondary | ICD-10-CM | POA: Diagnosis not present

## 2021-08-15 DIAGNOSIS — E785 Hyperlipidemia, unspecified: Secondary | ICD-10-CM | POA: Diagnosis present

## 2021-08-15 DIAGNOSIS — Y9301 Activity, walking, marching and hiking: Secondary | ICD-10-CM | POA: Diagnosis present

## 2021-08-15 DIAGNOSIS — S0990XA Unspecified injury of head, initial encounter: Secondary | ICD-10-CM | POA: Diagnosis not present

## 2021-08-15 DIAGNOSIS — Z043 Encounter for examination and observation following other accident: Secondary | ICD-10-CM | POA: Diagnosis not present

## 2021-08-15 DIAGNOSIS — R278 Other lack of coordination: Secondary | ICD-10-CM | POA: Diagnosis not present

## 2021-08-15 DIAGNOSIS — R062 Wheezing: Secondary | ICD-10-CM | POA: Diagnosis not present

## 2021-08-15 DIAGNOSIS — Z96642 Presence of left artificial hip joint: Secondary | ICD-10-CM

## 2021-08-15 DIAGNOSIS — I4891 Unspecified atrial fibrillation: Secondary | ICD-10-CM | POA: Diagnosis not present

## 2021-08-15 DIAGNOSIS — W010XXA Fall on same level from slipping, tripping and stumbling without subsequent striking against object, initial encounter: Secondary | ICD-10-CM | POA: Diagnosis present

## 2021-08-15 DIAGNOSIS — I959 Hypotension, unspecified: Secondary | ICD-10-CM | POA: Diagnosis not present

## 2021-08-15 DIAGNOSIS — R404 Transient alteration of awareness: Secondary | ICD-10-CM | POA: Diagnosis not present

## 2021-08-15 DIAGNOSIS — M1611 Unilateral primary osteoarthritis, right hip: Secondary | ICD-10-CM | POA: Diagnosis not present

## 2021-08-15 DIAGNOSIS — Z66 Do not resuscitate: Secondary | ICD-10-CM | POA: Diagnosis present

## 2021-08-15 DIAGNOSIS — N400 Enlarged prostate without lower urinary tract symptoms: Secondary | ICD-10-CM | POA: Diagnosis present

## 2021-08-15 DIAGNOSIS — I2510 Atherosclerotic heart disease of native coronary artery without angina pectoris: Secondary | ICD-10-CM | POA: Diagnosis present

## 2021-08-15 DIAGNOSIS — G4730 Sleep apnea, unspecified: Secondary | ICD-10-CM | POA: Diagnosis present

## 2021-08-15 DIAGNOSIS — J449 Chronic obstructive pulmonary disease, unspecified: Secondary | ICD-10-CM | POA: Diagnosis present

## 2021-08-15 DIAGNOSIS — K219 Gastro-esophageal reflux disease without esophagitis: Secondary | ICD-10-CM | POA: Diagnosis present

## 2021-08-15 DIAGNOSIS — S72002A Fracture of unspecified part of neck of left femur, initial encounter for closed fracture: Secondary | ICD-10-CM

## 2021-08-15 DIAGNOSIS — K589 Irritable bowel syndrome without diarrhea: Secondary | ICD-10-CM | POA: Diagnosis present

## 2021-08-15 DIAGNOSIS — Z471 Aftercare following joint replacement surgery: Secondary | ICD-10-CM | POA: Diagnosis not present

## 2021-08-15 DIAGNOSIS — D62 Acute posthemorrhagic anemia: Secondary | ICD-10-CM | POA: Diagnosis not present

## 2021-08-15 DIAGNOSIS — Z881 Allergy status to other antibiotic agents status: Secondary | ICD-10-CM

## 2021-08-15 DIAGNOSIS — R34 Anuria and oliguria: Secondary | ICD-10-CM | POA: Diagnosis not present

## 2021-08-15 DIAGNOSIS — Z8673 Personal history of transient ischemic attack (TIA), and cerebral infarction without residual deficits: Secondary | ICD-10-CM | POA: Diagnosis not present

## 2021-08-15 DIAGNOSIS — Z823 Family history of stroke: Secondary | ICD-10-CM

## 2021-08-15 DIAGNOSIS — S8292XA Unspecified fracture of left lower leg, initial encounter for closed fracture: Secondary | ICD-10-CM | POA: Diagnosis present

## 2021-08-15 DIAGNOSIS — K581 Irritable bowel syndrome with constipation: Secondary | ICD-10-CM | POA: Diagnosis not present

## 2021-08-15 DIAGNOSIS — Z7189 Other specified counseling: Secondary | ICD-10-CM | POA: Diagnosis not present

## 2021-08-15 DIAGNOSIS — K5900 Constipation, unspecified: Secondary | ICD-10-CM | POA: Diagnosis present

## 2021-08-15 DIAGNOSIS — Z743 Need for continuous supervision: Secondary | ICD-10-CM | POA: Diagnosis not present

## 2021-08-15 DIAGNOSIS — R451 Restlessness and agitation: Secondary | ICD-10-CM | POA: Diagnosis not present

## 2021-08-15 DIAGNOSIS — Z8261 Family history of arthritis: Secondary | ICD-10-CM

## 2021-08-15 DIAGNOSIS — I69354 Hemiplegia and hemiparesis following cerebral infarction affecting left non-dominant side: Secondary | ICD-10-CM

## 2021-08-15 DIAGNOSIS — R2681 Unsteadiness on feet: Secondary | ICD-10-CM | POA: Diagnosis not present

## 2021-08-15 DIAGNOSIS — R52 Pain, unspecified: Secondary | ICD-10-CM | POA: Diagnosis not present

## 2021-08-15 DIAGNOSIS — J189 Pneumonia, unspecified organism: Secondary | ICD-10-CM | POA: Diagnosis not present

## 2021-08-15 DIAGNOSIS — Z833 Family history of diabetes mellitus: Secondary | ICD-10-CM

## 2021-08-15 DIAGNOSIS — Z751 Person awaiting admission to adequate facility elsewhere: Secondary | ICD-10-CM

## 2021-08-15 DIAGNOSIS — R1312 Dysphagia, oropharyngeal phase: Secondary | ICD-10-CM | POA: Diagnosis not present

## 2021-08-15 DIAGNOSIS — I739 Peripheral vascular disease, unspecified: Secondary | ICD-10-CM | POA: Diagnosis not present

## 2021-08-15 DIAGNOSIS — R059 Cough, unspecified: Secondary | ICD-10-CM | POA: Diagnosis not present

## 2021-08-15 DIAGNOSIS — F32A Depression, unspecified: Secondary | ICD-10-CM | POA: Diagnosis present

## 2021-08-15 DIAGNOSIS — Z955 Presence of coronary angioplasty implant and graft: Secondary | ICD-10-CM

## 2021-08-15 DIAGNOSIS — Z9181 History of falling: Secondary | ICD-10-CM

## 2021-08-15 DIAGNOSIS — N281 Cyst of kidney, acquired: Secondary | ICD-10-CM | POA: Diagnosis not present

## 2021-08-15 LAB — CBC WITH DIFFERENTIAL/PLATELET
Abs Immature Granulocytes: 0.05 10*3/uL (ref 0.00–0.07)
Basophils Absolute: 0.1 10*3/uL (ref 0.0–0.1)
Basophils Relative: 1 %
Eosinophils Absolute: 0.2 10*3/uL (ref 0.0–0.5)
Eosinophils Relative: 2 %
HCT: 43.9 % (ref 39.0–52.0)
Hemoglobin: 15 g/dL (ref 13.0–17.0)
Immature Granulocytes: 1 %
Lymphocytes Relative: 24 %
Lymphs Abs: 1.9 10*3/uL (ref 0.7–4.0)
MCH: 31.6 pg (ref 26.0–34.0)
MCHC: 34.2 g/dL (ref 30.0–36.0)
MCV: 92.4 fL (ref 80.0–100.0)
Monocytes Absolute: 0.8 10*3/uL (ref 0.1–1.0)
Monocytes Relative: 9 %
Neutro Abs: 5.2 10*3/uL (ref 1.7–7.7)
Neutrophils Relative %: 63 %
Platelets: 206 10*3/uL (ref 150–400)
RBC: 4.75 MIL/uL (ref 4.22–5.81)
RDW: 13.4 % (ref 11.5–15.5)
WBC: 8.1 10*3/uL (ref 4.0–10.5)
nRBC: 0 % (ref 0.0–0.2)

## 2021-08-15 LAB — COMPREHENSIVE METABOLIC PANEL
ALT: 55 U/L — ABNORMAL HIGH (ref 0–44)
AST: 45 U/L — ABNORMAL HIGH (ref 15–41)
Albumin: 3.6 g/dL (ref 3.5–5.0)
Alkaline Phosphatase: 114 U/L (ref 38–126)
Anion gap: 10 (ref 5–15)
BUN: 11 mg/dL (ref 8–23)
CO2: 25 mmol/L (ref 22–32)
Calcium: 8.9 mg/dL (ref 8.9–10.3)
Chloride: 104 mmol/L (ref 98–111)
Creatinine, Ser: 1.05 mg/dL (ref 0.61–1.24)
GFR, Estimated: >60 mL/min (ref >60)
Glucose, Bld: 107 mg/dL — ABNORMAL HIGH (ref 70–99)
Potassium: 3.9 mmol/L (ref 3.5–5.1)
Sodium: 139 mmol/L (ref 135–145)
Total Bilirubin: 0.4 mg/dL (ref 0.3–1.2)
Total Protein: 6.8 g/dL (ref 6.5–8.1)

## 2021-08-15 LAB — PROTIME-INR
INR: 2.2 — ABNORMAL HIGH (ref 0.8–1.2)
Prothrombin Time: 24.2 seconds — ABNORMAL HIGH (ref 11.4–15.2)

## 2021-08-15 MED ORDER — ONDANSETRON HCL 4 MG/2ML IJ SOLN
4.0000 mg | Freq: Once | INTRAMUSCULAR | Status: AC
Start: 2021-08-15 — End: 2021-08-15
  Administered 2021-08-15: 4 mg via INTRAVENOUS
  Filled 2021-08-15: qty 2

## 2021-08-15 MED ORDER — MORPHINE SULFATE (PF) 4 MG/ML IV SOLN
4.0000 mg | Freq: Once | INTRAVENOUS | Status: AC
  Administered 2021-08-15: 4 mg via INTRAVENOUS
  Filled 2021-08-15: qty 1

## 2021-08-15 MED ORDER — MORPHINE SULFATE (PF) 2 MG/ML IV SOLN
2.0000 mg | Freq: Once | INTRAVENOUS | Status: AC
  Administered 2021-08-15: 2 mg via INTRAVENOUS
  Filled 2021-08-15: qty 1

## 2021-08-15 NOTE — ED Notes (Signed)
Pt giving something to drink; informed of NPO status after midnight ?

## 2021-08-15 NOTE — ED Provider Notes (Signed)
[facility] EMERGENCY DEPARTMENT Provider Note   CSN: [id number] Arrival date & time: 08/15/21  2035     History  Chief Complaint  Patient presents with   [name] is a 76 y.o. male.  Patient presents chief complaint of fall.  Using his walker lost his balance fell backwards, not complaining of left hip pain.  Denies neck pain or back pain or other extremity pain.  Denies loss of consciousness.  Has a history of multiple recent falls.      Home Medications Prior to Admission medications   Medication Sig Start Date End Date Taking? Authorizing Provider  acetaminophen (TYLENOL) 325 MG tablet Take 2 tablets (650 mg total) by mouth every 6 (six) hours as needed for mild pain or moderate pain. 01/14/21   Sharion Settler, DO  amiodarone (PACERONE) 200 MG tablet Take 1 tablet (200 mg total) by mouth daily. 11/25/20   Jettie Booze, MD  amiodarone (PACERONE) 200 MG tablet Take 1 tablet (200 mg total) by mouth daily. 04/08/21   Jettie Booze, MD  amLODipine (NORVASC) 2.5 MG tablet Take 1 tablet (2.5 mg total) by mouth daily. 06/20/21   Jettie Booze, MD  aspirin 81 MG tablet Take 81 mg by mouth daily with supper.    [provider]  bisoprolol (ZEBETA) 10 MG tablet TAKE 1/2 TABLET BY MOUTH DAILY 07/16/21   Sharion Settler, DO  bisoprolol (ZEBETA) 5 MG tablet Take 0.5 tablets (2.5 mg total) by mouth daily. 01/15/21   Sharion Settler, DO  citalopram (CELEXA) 40 MG tablet TAKE 1 TABLET BY MOUTH EVERY DAY 06/06/21   [name], MD  diphenhydramine-acetaminophen (TYLENOL PM) 25-500 MG TABS tablet Take 2 tablets by mouth at bedtime as needed (leg pain).    [provider]  fluticasone (FLONASE) 50 MCG/ACT nasal spray Place 1 spray into both nostrils at bedtime as needed (stuffy nose). 05/20/20   [provider]  lisinopril (ZESTRIL) 40 MG tablet Take 1 tablet (40 mg total) by mouth daily with supper. For  blood pressure. 01/14/21   Sharion Settler, DO  Multiple Vitamins-Minerals (MULTIVITAMIN WITH MINERALS) tablet Take 1 tablet by mouth daily with supper. Centrum Silver    [provider]  nitroGLYCERIN (NITROSTAT) 0.4 MG SL tablet Place 1 tablet (0.4 mg total) every 5 (five) minutes as needed under the tongue for chest pain. 04/16/17   Leone Haven, MD  omeprazole (PRILOSEC) 40 MG capsule TAKE 1 CAPSULE (40 MG TOTAL) BY MOUTH 2 (TWO) TIMES DAILY BEFORE A MEAL. FOR HEARTBURN. 04/11/21   [name], MD  ondansetron (ZOFRAN-ODT) 4 MG disintegrating tablet TAKE 1 TABLET BY MOUTH EVERY 8 HOURS AS NEEDED FOR NAUSEA AND VOMITING 03/20/21   [name], MD  PRALUENT 75 MG/ML SOAJ INJECT 75 MG INTO THE SKIN EVERY 14 (FOURTEEN) DAYS. 08/01/21   Jettie Booze, MD  pramipexole (MIRAPEX) 0.25 MG tablet Take 1 tablet (0.25 mg total) by mouth at bedtime. 07/02/21   Tat, Eustace Quail, DO  pravastatin (PRAVACHOL) 80 MG tablet Take 1 tablet (80 mg total) by mouth daily. For cholesterol. 03/22/20   Minna Merritts, MD  psyllium (HYDROCIL/METAMUCIL) 95 % PACK Take 1 packet by mouth daily.    [provider]  senna (SENOKOT) 8.6 MG TABS tablet Take 1 tablet (8.6 mg total) by mouth daily. For constipation 03/07/21   [name], MD  solifenacin (VESICARE) 5 MG tablet Take 5 mg by mouth daily. 06/20/21  [provider]  tiZANidine (ZANAFLEX) 4 MG tablet TAKE 1 TABLET BY MOUTH AT BEDTIME AS NEEDED FOR MUSCLE SPASMS. 08/05/21   Lilland, Alana, DO  triamcinolone cream (KENALOG) 0.1 % Apply 1 application topically daily as needed (rash around nose).    [provider]  vitamin B-12 (CYANOCOBALAMIN) 1000 MCG tablet Take 1,000 mcg by mouth daily with supper.    [provider]  warfarin (COUMADIN) 7.5 MG tablet Take 1 tablet (7.'5mg'$  ) by mouth on Sun and Thurs and all other days take 1/2 tablet (3.'75mg'$ ) at supper time. 06/03/21   Jettie Booze, MD       Allergies    Bactrim [sulfamethoxazole-trimethoprim], Ciprofloxacin, and Crestor [rosuvastatin calcium]    Review of Systems   Review of Systems  Constitutional:  Negative for fever.  HENT:  Negative for ear pain and sore throat.   Eyes:  Negative for pain.  Respiratory:  Negative for cough.   Cardiovascular:  Negative for chest pain.  Gastrointestinal:  Negative for abdominal pain.  Genitourinary:  Negative for flank pain.  Musculoskeletal:  Negative for back pain.  Skin:  Negative for color change and rash.  Neurological:  Negative for syncope.  All other systems reviewed and are negative.  Physical Exam Updated Vital Signs BP 124/82    Pulse (!) 59    Temp 98.4 F (36.9 C)    Resp 12    Ht 6' (1.829 m)    Wt 81 kg    SpO2 94%    BMI 24.22 kg/m  Physical Exam Constitutional:      Appearance: He is well-developed.  HENT:     Head: Normocephalic.     Nose: Nose normal.  Eyes:     Extraocular Movements: Extraocular movements intact.  Cardiovascular:     Rate and Rhythm: Normal rate.  Pulmonary:     Effort: Pulmonary effort is normal.  Musculoskeletal:     Comments: Tenderness to the left hip but not any attempted range of motion.  Otherwise neurovascularly intact bilateral upper and lower extremities.  No C or T or L-spine midline step-offs or tenderness noted.  Skin:    Coloration: Skin is not jaundiced.  Neurological:     General: No focal deficit present.     Mental Status: He is alert and oriented to person, place, and time. Mental status is at baseline.     Cranial Nerves: No cranial nerve deficit.     Motor: No weakness.    ED Results / Procedures / Treatments   Labs (all labs ordered are listed, but only abnormal results are displayed) Labs Reviewed  COMPREHENSIVE METABOLIC PANEL - Abnormal; Notable for the following components:      Result Value   Glucose, Bld 107 (*)    AST 45 (*)    ALT 55 (*)    All other components within normal limits   PROTIME-INR - Abnormal; Notable for the following components:   Prothrombin Time 24.2 (*)    INR 2.2 (*)    All other components within normal limits  RESP PANEL BY RT-PCR (FLU A&B, COVID) ARPGX2  CBC WITH DIFFERENTIAL/PLATELET    EKG EKG Interpretation  Date/Time:  Friday August 15 2021 22:57:10 EST Ventricular Rate:  60 PR Interval:  217 QRS Duration: 104 QT Interval:  482 QTC Calculation: 482 R Axis:   74 Text Interpretation: Sinus rhythm Borderline prolonged PR interval Anteroseptal infarct, old Confirmed by Thamas Jaegers ([id number]) on 08/15/2021 11:06:43 PM  Radiology CT  Head Wo Contrast  Result Date: 08/15/2021 CLINICAL DATA:  Head trauma. EXAM: CT HEAD WITHOUT CONTRAST TECHNIQUE: Contiguous axial images were obtained from the base of the skull through the vertex without intravenous contrast. RADIATION DOSE REDUCTION: This exam was performed according to the departmental dose-optimization program which includes automated exposure control, adjustment of the mA and/or kV according to patient size and/or use of iterative reconstruction technique. COMPARISON:  Head CT dated 07/09/2021. FINDINGS: Brain: Moderate age-related atrophy and chronic microvascular ischemic changes. There is no acute intracranial hemorrhage. No mass effect or midline shift. No extra-axial fluid collection. Vascular: No hyperdense vessel or unexpected calcification. Skull: Normal. Negative for fracture or focal lesion. Sinuses/Orbits: No acute finding. Other: None IMPRESSION: 1. No acute intracranial pathology. 2. Moderate age-related atrophy and chronic microvascular ischemic changes. Electronically Signed   By: Anner Crete M.D.   On: 08/15/2021 21:31   CT PELVIS WO CONTRAST  Result Date: 08/15/2021 CLINICAL DATA:  Trauma. EXAM: CT PELVIS WITHOUT CONTRAST TECHNIQUE: Multidetector CT imaging of the pelvis was performed following the standard protocol without intravenous contrast. RADIATION DOSE REDUCTION: This  exam was performed according to the departmental dose-optimization program which includes automated exposure control, adjustment of the mA and/or kV according to patient size and/or use of iterative reconstruction technique. COMPARISON:  CT of the chest abdomen pelvis dated 01/13/2021. FINDINGS: Evaluation of this exam is limited in the absence of intravenous contrast. Urinary Tract:  No abnormality visualized. Bowel:  Unremarkable visualized pelvic bowel loops. Vascular/Lymphatic: There is atherosclerotic calcification of the abdominal aorta and iliac arteries. No pelvic adenopathy. Reproductive: The prostate and seminal vesicles are grossly unremarkable. Other:  None Musculoskeletal: The bones are osteopenic. There is a somewhat comminuted and mildly angulated fracture of the left femoral neck extending from the subcapital to the intertrochanteric region. There is mild there is angulation. No dislocation. IMPRESSION: Mildly comminuted and mildly angulated fracture of the left femoral neck. Aortic Atherosclerosis (ICD10-I70.0). Electronically Signed   By: Anner Crete M.D.   On: 08/15/2021 22:22   DG Chest Portable 1 View  Result Date: 08/15/2021 CLINICAL DATA:  Fall injury. EXAM: PORTABLE CHEST 1 VIEW COMPARISON:  Portable chest 07/09/2021 FINDINGS: There is mild cardiomegaly. No evidence of CHF or pleural collections. Aortic atherosclerosis with stable mediastinum. The lungs are clear. Osteopenia. Thoracic spondylosis. There is overlying monitor wiring. IMPRESSION: 1. No evidence of acute chest process. Stable chest with mild cardiomegaly. 2. Aortic atherosclerosis. Electronically Signed   By: Telford Nab M.D.   On: 08/15/2021 21:24   DG Hip Unilat W or Wo Pelvis 2-3 Views Left  Result Date: 08/15/2021 CLINICAL DATA:  fall EXAM: DG HIP (WITH OR WITHOUT PELVIS) 2-3V LEFT.  Patient is rotated. COMPARISON:  None. FINDINGS: Query cortical lucency and irregularity of the neck and greater trochanter of  the left hip. There is no evidence of definite acute displaced fracture or dislocation of the left hip. Frontal view of the right hip grossly unremarkable. No definite acute displaced fracture of the pelvis. Unable to evaluate for pelvic diastasis due to patient rotation. There is no evidence of severe arthropathy or other focal bone abnormality. Vascular calcifications. IMPRESSION: 1. No definite acute displaced fracture or dislocation of the left hip. 2. Query cortical lucency and irregularity of the neck and greater trochanter of the left hip. Consider CT for further evaluation if clinically indicated. Electronically Signed   By: Iven Finn M.D.   On: 08/15/2021 21:26    Procedures Procedures  Medications Ordered in ED Medications  morphine (PF) 2 MG/ML injection 2 mg (2 mg Intravenous Given 08/15/21 2053)  ondansetron (ZOFRAN) injection 4 mg (4 mg Intravenous Given 08/15/21 2052)  morphine (PF) 4 MG/ML injection 4 mg (4 mg Intravenous Given 08/15/21 2225)    ED Course/ Medical Decision Making/ A&P                           Medical Decision Making Amount and/or Complexity of Data Reviewed Labs: ordered. Radiology: ordered.  Risk Prescription drug management.   Chart review shows visit with cardiology 3 days ago for evaluation of his Coumadin levels.  Patient on the monitor showing mild bradycardia otherwise no other arrhythmias noted.  Appears sinus.  Patient was a level 2 trauma activation given anticoagulation and head injury.  CT of the brain shows no acute pathology.  X-rays equivocal.  CT pursued showing left femoral neck fracture.  Case discussed with family medicine for admission.    Case discussed with orthopedics, Dr. Kathaleen Bury, he suspects possible intervention tomorrow but more likely in 2 days.  Recommending n.p.o. after midnight today.        Final Clinical Impression(s) / ED Diagnoses Final diagnoses:  Closed fracture of neck of left femur, initial  encounter [facility])    Rx / [place] Orders ED Discharge Orders     None         Luna Fuse, MD 08/15/21 2337

## 2021-08-15 NOTE — Progress Notes (Signed)
Orthopedic Tech Progress Note ?Patient Details:  ?[name] ?06/30/1945 ?[id number] ? ?Patient ID: [name], male   DOB: August 15, 1945, 76 y.o.   MRN: [id number] ?I attended trauma page ?Karolee Stamps ?08/15/2021, 8:42 PM ? ?

## 2021-08-15 NOTE — ED Triage Notes (Signed)
Pt from home for fall on warfarin. Pt has hx of frequent falls. C/o L hip pain with difficulty straightening leg.  ?

## 2021-08-15 NOTE — ED Notes (Signed)
Patient transported to CT 

## 2021-08-15 NOTE — H&P (Addendum)
[facility] Admission History and Physical Service Pager: [phone]  Patient name: [name] Medical record number: [id number] Date of birth: 11/23/1945 Age: 76 y.o. Gender: male  Primary Care Provider: [name], MD Consultants: Orthopedic surgery Code Status: DNR which was confirmed with patient Preferred Emergency Contact:  Contact Information     Name Relation Home Work Mobile   Gladwin,[name] Spouse [phone]         Chief Complaint: fall with consequent L femoral fracture  Assessment and Plan: [name] is a 76 y.o. male presenting with recurrent falls and consequent left femoral fracture . PMH is significant for progressive supranuclear palsy (PSP), paroxysmal A-fib on Coumadin, CAD s/p stent (2017), MI (2007), CVA x2, COPD, GERD, HTN, HLD, BPH.  Left femoral neck fracture Pt presented to the ED after fall in which he hit his head due to residual left-sided weakness from prior stroke and worsening balance due to progressive supranuclear palsy (PSP).  Of note, patient is on Coumadin for paroxysmal A-fib.  CT head nl. Hip XR showed no acute displaced fracture or dislocation of the left hip but irregularity of the neck and greater trochanter of the L hip. CT pelvis showed mildly comminuted and mildly angulated fracture of the L femoral neck. Hemodynamically stable at this time without acute signs of bleeding. Morphine and tylenol able to control pain thus far. ED provider consulted orthopedic surgery. -Admitted to [facility], attending Dr. Gwendlyn Deutscher, medical telemetry unit -Orthopedic surgery consulted, appreciate recommendations -Hold Coumadin, heparin per pharmacy consult -Neurochecks and neurovascular checks every 4 hours -Vital signs per unit -Fall precautions -PT/OT eval and treat -Tylenol significantly milligrams every 6 hours as needed -Morphine 4 mg q4h prn -Monitor pain -A.m. CBC -Incentive Spirometry q2h while  awake  Progressive supranuclear palsy (PSP) Diagnosed this year as patient was poorly responding to Sinemet for suspected Parkinson's.  According to family, patient was given prognosis of 5 to 7 years but recently has been struggling with balance and increased rate of falls as this condition and his residual weakness from prior stroke have given him difficulty.  He normally uses a walker. Family endorses that patient has had a worsened emotional state. Amenable to palliative care consult.  They are not interested in psychiatry/psychology at this time.  Home medications include Mirapex as needed, Zanaflex 4 mg as needed for leg spasms. -Palliative care consult, appreciate assistance -Hold home medications in the setting of potential orthopedic surgery  Paroxysmal AFib on coumadin EKG showed NSR at 60.  Home medications include amiodarone 200 mg daily and bisoprolol 5 mg daily.  Patient is also on warfarin and aspirin 81 mg daily.  INR appropriate at 2.2. -Hold home medications in the setting of potential orthopedic surgery  CAD s/p stent (2007)   MI (~2007) Repeat heart cath in 01/25/20 showed LVEF 55 to 65% with normal LV function and prox cx to mid cx lesion is 10% stenosed.  CVA x2 w/ residual left-sided weakness   HLD Most recent LDL on 10/16/2020 was 64. Home medications include aspirin 81 mg, pravastatin 80 mg daily, Praluent '75mg'$  q14d. -Hold home medications in the setting of potential orthopedic surgery  Constipation Asymptomatic at this time but without bowel movement for 4 days (normally stools daily or every other day).  Home medications include senna and Metamucil daily.  His medications will need to be restarted given constipation at baseline in addition to likely opioids after orthopedic surgery. -Hold home medications in the setting of potential orthopedic surgery  GERD Home medications include omeprazole 40 mg twice daily. -Hold home medication setting of potential orthopedic  surgery  HTN Stable in the 120s to 130s over 70s to 80s.  Home medications include amlodipine 2.5 mg daily, bisoprolol 5 mg daily, amiodarone 200 mg daily, lisinopril 40 mg daily. -Hold home lisinopril in the setting of potential orthopedic surgery -Restart all other home medications  FEN/GI: N.p.o. Prophylaxis: Heparin per pharmacy  Disposition: Med telemetry  History of Present Illness:  [name] is a 76 y.o. male presenting with fall while on warfarin while using his walker in which patient hit his head and left hip.  Patient and wife endorses increased number of falls due to residual weakness from prior strokes that affected his left lower extremity in addition to worsening of his recently diagnosed progressive supranuclear palsy.  He generally falls backward.  Patient normally uses a walker to get across the room no further than 10-12 steps with his wife walking behind him to ensure he does not fall.  In this instance, wife went to place a pad on the sofa and patient fell onto his left side and hit his head on the patio door.  She has noticed increased leaning towards his left lately.  He has been unable to straighten his left leg due to pain.  Denies any loss of consciousness or headaches.  He does have a history of falls and takes warfarin daily.  Wife endorses that his mood has not worsened recently as his PSP has increasingly affected his life.  Review Of Systems: Per HPI with the following additions:   Review of Systems  Constitutional:  Negative for fever.  Eyes:  Negative for visual disturbance.  Respiratory:  Negative for shortness of breath.   Cardiovascular:  Negative for chest pain and palpitations.  Gastrointestinal:  Negative for abdominal pain, nausea and vomiting.  Neurological:  Negative for syncope and headaches.    Patient Active Problem List   Diagnosis Date Noted   Insect bite 04/15/2021   Drug-induced tremor 03/20/2021   Depression, recurrent ([place])  01/18/2021   Urinary frequency 01/18/2021   Partial small bowel obstruction ([place]) 01/13/2021   Thyroid nodule 12/23/2020   Suspected Movement disorder- possibly PD  12/22/2020   Hematoma- Paraspinal from Fall  12/22/2020   Frequent falls 11/13/2020   Orthostatic dizziness 08/28/2020   Statin myopathy 06/07/2019   Chronic obstructive pulmonary disease ([place]) 06/13/2018   Sleep apnea 09/16/2017   GERD (gastroesophageal reflux disease) 02/08/2015   IBS (irritable bowel syndrome) 02/08/2015   Essential hypertension 02/08/2015   AF (paroxysmal atrial fibrillation) ([place]) 01/23/2015   CAD (coronary artery disease) 01/23/2015   History of CVA (cerebrovascular accident) 01/23/2015    Past Medical History: Past Medical History:  Diagnosis Date   BPH (benign prostatic hyperplasia)    COPD (chronic obstructive pulmonary disease) ([place]) 2021   Coronary artery disease    a. s/p PCI to LCx in 2007; b. MV 2017 no sig ischemia, EF 59%, nl study. c. Cath 01/2020 without significant recurrent dz.   Depression    GERD (gastroesophageal reflux disease)    Hyperlipidemia    Hypertension    IBS (irritable bowel syndrome)    Myocardial infarction ([place])    12-13 yrs ago    PAF (paroxysmal atrial fibrillation) ([place])    a. CHADS2VASc => 5 (HTN, age x 1, stroke x 2, vascular disease); b. on Coumadin    PNA (pneumonia)    Skin cancer    face  Sleep apnea    off cpap    Stroke ([place])    a. x 2 with residual left-sided weakness    Past Surgical History: Past Surgical History:  Procedure Laterality Date   [place]  2006   X1 STENT   COLONOSCOPY     CORONARY ANGIOPLASTY     POLYPECTOMY     RIGHT/LEFT HEART CATH AND CORONARY ANGIOGRAPHY N/A 01/25/2020   Procedure: RIGHT/LEFT HEART CATH AND CORONARY ANGIOGRAPHY;  Surgeon: Minna Merritts, MD;  Location: [facility];  Service: Cardiovascular;  Laterality: N/A;   SKIN CANCER EXCISION N/A 08/2016    Forehead.  [facility] (Dr. Deliah Boston)   THYROIDECTOMY, PARTIAL     UPPER GASTROINTESTINAL ENDOSCOPY     VASCULAR SURGERY     varicose vein stripping left leg    Social History: Social History   Tobacco Use   Smoking status: Former    Years: 20.00    Types: Cigarettes   Smokeless tobacco: Never  Vaping Use   Vaping Use: Never used  Substance Use Topics   Alcohol use: Yes    Alcohol/week: 2.0 - 3.0 standard drinks    Types: 2 - 3 Glasses of wine per week    Comment: Maybe a glass or two a week   Drug use: No    Family History: Family History  Problem Relation Age of Onset   Heart Problems Father    Heart disease Father    Hypertension Father    Diabetes Father    Mental illness Mother    Arthritis Mother    Cancer Mother        ovarian   Stroke Paternal Uncle    Heart disease Paternal Grandfather    Cancer Maternal Aunt    Stroke Maternal Grandmother    Sudden death Maternal Grandfather    Heart disease Paternal Grandmother    Colon cancer Neg Hx    Esophageal cancer Neg Hx    Rectal cancer Neg Hx    Colon polyps Neg Hx    Stomach cancer Neg Hx     Allergies and Medications: Allergies  Allergen Reactions   Bactrim [Sulfamethoxazole-Trimethoprim] Anaphylaxis    Ulcers   Ciprofloxacin Swelling    Tongue   Crestor [Rosuvastatin Calcium]     Severe Myalgias   Current Facility-Administered Medications on File Prior to Encounter  Medication Dose Route Frequency Provider Last Rate Last Admin   sodium chloride flush (NS) 0.9 % injection 3 mL  3 mL Intravenous Q12H Mickle Plumb, Jacquelyn D, PA-C       Current Outpatient Medications on File Prior to Encounter  Medication Sig Dispense Refill   acetaminophen (TYLENOL) 325 MG tablet Take 2 tablets (650 mg total) by mouth every 6 (six) hours as needed for mild pain or moderate pain. 30 tablet 1   amiodarone (PACERONE) 200 MG tablet Take 1 tablet (200 mg total) by mouth daily. 90 tablet 2   amiodarone  (PACERONE) 200 MG tablet Take 1 tablet (200 mg total) by mouth daily. 90 tablet 3   amLODipine (NORVASC) 2.5 MG tablet Take 1 tablet (2.5 mg total) by mouth daily. 90 tablet 3   aspirin 81 MG tablet Take 81 mg by mouth daily with supper.     bisoprolol (ZEBETA) 10 MG tablet TAKE 1/2 TABLET BY MOUTH DAILY 45 tablet 3   bisoprolol (ZEBETA) 5 MG tablet Take 0.5 tablets (2.5 mg total) by mouth daily. 30 tablet  1   citalopram (CELEXA) 40 MG tablet TAKE 1 TABLET BY MOUTH EVERY DAY 90 tablet 2   diphenhydramine-acetaminophen (TYLENOL PM) 25-500 MG TABS tablet Take 2 tablets by mouth at bedtime as needed (leg pain).     fluticasone (FLONASE) 50 MCG/ACT nasal spray Place 1 spray into both nostrils at bedtime as needed (stuffy nose).     lisinopril (ZESTRIL) 40 MG tablet Take 1 tablet (40 mg total) by mouth daily with supper. For blood pressure. 30 tablet 1   Multiple Vitamins-Minerals (MULTIVITAMIN WITH MINERALS) tablet Take 1 tablet by mouth daily with supper. Centrum Silver     nitroGLYCERIN (NITROSTAT) 0.4 MG SL tablet Place 1 tablet (0.4 mg total) every 5 (five) minutes as needed under the tongue for chest pain. 25 tablet 3   omeprazole (PRILOSEC) 40 MG capsule TAKE 1 CAPSULE (40 MG TOTAL) BY MOUTH 2 (TWO) TIMES DAILY BEFORE A MEAL. FOR HEARTBURN. 180 capsule 1   ondansetron (ZOFRAN-ODT) 4 MG disintegrating tablet TAKE 1 TABLET BY MOUTH EVERY 8 HOURS AS NEEDED FOR NAUSEA AND VOMITING 20 tablet 0   PRALUENT 75 MG/ML SOAJ INJECT 75 MG INTO THE SKIN EVERY 14 (FOURTEEN) DAYS. 6 mL 3   pramipexole (MIRAPEX) 0.25 MG tablet Take 1 tablet (0.25 mg total) by mouth at bedtime. 90 tablet 1   pravastatin (PRAVACHOL) 80 MG tablet Take 1 tablet (80 mg total) by mouth daily. For cholesterol.     psyllium (HYDROCIL/METAMUCIL) 95 % PACK Take 1 packet by mouth daily.     senna (SENOKOT) 8.6 MG TABS tablet Take 1 tablet (8.6 mg total) by mouth daily. For constipation 90 tablet 0   solifenacin (VESICARE) 5 MG tablet Take 5  mg by mouth daily.     tiZANidine (ZANAFLEX) 4 MG tablet TAKE 1 TABLET BY MOUTH AT BEDTIME AS NEEDED FOR MUSCLE SPASMS. 30 tablet 1   triamcinolone cream (KENALOG) 0.1 % Apply 1 application topically daily as needed (rash around nose).     vitamin B-12 (CYANOCOBALAMIN) 1000 MCG tablet Take 1,000 mcg by mouth daily with supper.     warfarin (COUMADIN) 7.5 MG tablet Take 1 tablet (7.'5mg'$  ) by mouth on Sun and Thurs and all other days take 1/2 tablet (3.'75mg'$ ) at supper time. 70 tablet 1    Objective: BP (!) 143/87    Pulse (!) 59    Temp 98.4 F (36.9 C)    Resp 15    Ht 6' (1.829 m)    Wt 81 kg    SpO2 96%    BMI 24.22 kg/m  Exam: General: Awake, alert and appropriately responsive  in NAD HEENT: EOMI with lessened inferior eye movement, head atraumatic Neck: ROM normal Chest: CTAB, normal WOB. Good air movement bilaterally.   Heart: RRR, no murmur appreciated, 2+ bilateral pedal pulses Abdomen: Soft, non-tender, non-distended. Normoactive bowel sounds.  Extremities: Tenderness to left hip, 5/5 RUE strength, 4/5 LUE strength, sensation intact, able to wiggle toes bilaterally, deferred moving left lower extremity MSK: Normal bulk and tone Neuro: CN II through XII intact,  Skin: No bruising or hematoma appreciated  Labs and Imaging: CBC BMET  Recent Labs  Lab 08/15/21 2045  WBC 8.1  HGB 15.0  HCT 43.9  PLT 206   Recent Labs  Lab 08/15/21 2045  NA 139  K 3.9  CL 104  CO2 25  BUN 11  CREATININE 1.05  GLUCOSE 107*  CALCIUM 8.9     EKG: NSR at 60  CT Head Wo  Contrast  Result Date: 08/15/2021 CLINICAL DATA:  Head trauma. EXAM: CT HEAD WITHOUT CONTRAST TECHNIQUE: Contiguous axial images were obtained from the base of the skull through the vertex without intravenous contrast. RADIATION DOSE REDUCTION: This exam was performed according to the departmental dose-optimization program which includes automated exposure control, adjustment of the mA and/or kV according to patient size  and/or use of iterative reconstruction technique. COMPARISON:  Head CT dated 07/09/2021. FINDINGS: Brain: Moderate age-related atrophy and chronic microvascular ischemic changes. There is no acute intracranial hemorrhage. No mass effect or midline shift. No extra-axial fluid collection. Vascular: No hyperdense vessel or unexpected calcification. Skull: Normal. Negative for fracture or focal lesion. Sinuses/Orbits: No acute finding. Other: None IMPRESSION: 1. No acute intracranial pathology. 2. Moderate age-related atrophy and chronic microvascular ischemic changes. Electronically Signed   By: Anner Crete M.D.   On: 08/15/2021 21:31   CT PELVIS WO CONTRAST  Result Date: 08/15/2021 CLINICAL DATA:  Trauma. EXAM: CT PELVIS WITHOUT CONTRAST TECHNIQUE: Multidetector CT imaging of the pelvis was performed following the standard protocol without intravenous contrast. RADIATION DOSE REDUCTION: This exam was performed according to the departmental dose-optimization program which includes automated exposure control, adjustment of the mA and/or kV according to patient size and/or use of iterative reconstruction technique. COMPARISON:  CT of the chest abdomen pelvis dated 01/13/2021. FINDINGS: Evaluation of this exam is limited in the absence of intravenous contrast. Urinary Tract:  No abnormality visualized. Bowel:  Unremarkable visualized pelvic bowel loops. Vascular/Lymphatic: There is atherosclerotic calcification of the abdominal aorta and iliac arteries. No pelvic adenopathy. Reproductive: The prostate and seminal vesicles are grossly unremarkable. Other:  None Musculoskeletal: The bones are osteopenic. There is a somewhat comminuted and mildly angulated fracture of the left femoral neck extending from the subcapital to the intertrochanteric region. There is mild there is angulation. No dislocation. IMPRESSION: Mildly comminuted and mildly angulated fracture of the left femoral neck. Aortic Atherosclerosis  (ICD10-I70.0). Electronically Signed   By: Anner Crete M.D.   On: 08/15/2021 22:22   DG Chest Portable 1 View  Result Date: 08/15/2021 CLINICAL DATA:  Fall injury. EXAM: PORTABLE CHEST 1 VIEW COMPARISON:  Portable chest 07/09/2021 FINDINGS: There is mild cardiomegaly. No evidence of CHF or pleural collections. Aortic atherosclerosis with stable mediastinum. The lungs are clear. Osteopenia. Thoracic spondylosis. There is overlying monitor wiring. IMPRESSION: 1. No evidence of acute chest process. Stable chest with mild cardiomegaly. 2. Aortic atherosclerosis. Electronically Signed   By: Telford Nab M.D.   On: 08/15/2021 21:24   DG Hip Unilat W or Wo Pelvis 2-3 Views Left  Result Date: 08/15/2021 CLINICAL DATA:  fall EXAM: DG HIP (WITH OR WITHOUT PELVIS) 2-3V LEFT.  Patient is rotated. COMPARISON:  None. FINDINGS: Query cortical lucency and irregularity of the neck and greater trochanter of the left hip. There is no evidence of definite acute displaced fracture or dislocation of the left hip. Frontal view of the right hip grossly unremarkable. No definite acute displaced fracture of the pelvis. Unable to evaluate for pelvic diastasis due to patient rotation. There is no evidence of severe arthropathy or other focal bone abnormality. Vascular calcifications. IMPRESSION: 1. No definite acute displaced fracture or dislocation of the left hip. 2. Query cortical lucency and irregularity of the neck and greater trochanter of the left hip. Consider CT for further evaluation if clinically indicated. Electronically Signed   By: Iven Finn M.D.   On: 08/15/2021 21:26     Wells Guiles, DO 08/15/2021, 10:50 PM  PGY-1, [facility] pager: [phone], text pages welcome   FPTS Upper-Level Resident Addendum   I have independently interviewed and examined the patient. I have discussed the above with the original author and agree with their documentation. . Please see also any  attending notes.    Carollee Leitz, MD PGY-3, [facility] 08/16/2021 3:36 AM  FPTS Service pager: [phone] (text pages welcome through [facility])

## 2021-08-15 NOTE — ED Notes (Addendum)
..  Trauma Response Nurse Documentation ? ? ?[name] is a 76 y.o. male arriving to [facility] ED via EMS ? ?On warfarin daily. Trauma was activated as a Level 2 by charge nurse based on the following trauma criteria Elderly patients > 65 with head trauma on anti-coagulation (excluding ASA). Trauma team at the bedside on patient arrival. Patient cleared for CT by Dr. Almyra Free. Patient to CT with team. GCS 15. ? ?History  ? Past Medical History:  ?Diagnosis Date  ? BPH (benign prostatic hyperplasia)   ? COPD (chronic obstructive pulmonary disease) ([place]) 2021  ? Coronary artery disease   ? a. s/p PCI to LCx in 2007; b. MV 2017 no sig ischemia, EF 59%, nl study. c. Cath 01/2020 without significant recurrent dz.  ? Depression   ? GERD (gastroesophageal reflux disease)   ? Hyperlipidemia   ? Hypertension   ? IBS (irritable bowel syndrome)   ? Myocardial infarction [facility])   ? 12-13 yrs ago   ? PAF (paroxysmal atrial fibrillation) ([place])   ? a. CHADS2VASc => 5 (HTN, age x 1, stroke x 2, vascular disease); b. on Coumadin   ? PNA (pneumonia)   ? Skin cancer   ? face   ? Sleep apnea   ? off cpap   ? Stroke [facility])   ? a. x 2 with residual left-sided weakness  ?  ? Past Surgical History:  ?Procedure Laterality Date  ? ADENOIDECTOMY  1951  ? CARDIAC CATHETERIZATION  2006  ? X1 STENT  ? COLONOSCOPY    ? CORONARY ANGIOPLASTY    ? POLYPECTOMY    ? RIGHT/LEFT HEART CATH AND CORONARY ANGIOGRAPHY N/A 01/25/2020  ? Procedure: RIGHT/LEFT HEART CATH AND CORONARY ANGIOGRAPHY;  Surgeon: Minna Merritts, MD;  Location: [facility];  Service: Cardiovascular;  Laterality: N/A;  ? SKIN CANCER EXCISION N/A 08/2016  ? Forehead.  [facility] (Dr. Deliah Boston)  ? THYROIDECTOMY, PARTIAL    ? UPPER GASTROINTESTINAL ENDOSCOPY    ? VASCULAR SURGERY    ? varicose vein stripping left leg  ?  ? ? ? ?Initial Focused Assessment (If applicable, or please see trauma documentation): ? ?See trauma documentation ?CT's Completed:   ?CT Head,  Pelvis w/o ? ?Interventions:  ?-Pt transported toCT ?-family communication ?-MD communication ?Plan for disposition:  ?Admission to floor  ? ?Consults completed:  ?Family Practice page at [phone]  ?Orthopaedic Surgeon at .2252 ? ?Event Summary: ?Pt arrived via EMS from home s/p mechanical fall, denies LOC, pain to L hip, small hematoma R occiput. GCS 15, family at bedside.  ?Pt taken back to CT after ? Fx on plain film, pt transferred to inpatient stretcher after CT.  ?+L hip fx, pt to be admitted ? ? ?Bedside handoff with ED RN Marye Round, RN.   ? ?Kellyanne Ellwanger Dee  ?Trauma Response RN ? ?Please call TRN at [phone] for further assistance. ?  ?

## 2021-08-16 ENCOUNTER — Encounter (HOSPITAL_COMMUNITY): Payer: Self-pay | Admitting: Student

## 2021-08-16 DIAGNOSIS — I480 Paroxysmal atrial fibrillation: Secondary | ICD-10-CM | POA: Diagnosis present

## 2021-08-16 DIAGNOSIS — S8292XA Unspecified fracture of left lower leg, initial encounter for closed fracture: Secondary | ICD-10-CM | POA: Diagnosis present

## 2021-08-16 DIAGNOSIS — Z515 Encounter for palliative care: Secondary | ICD-10-CM

## 2021-08-16 DIAGNOSIS — I2510 Atherosclerotic heart disease of native coronary artery without angina pectoris: Secondary | ICD-10-CM | POA: Diagnosis present

## 2021-08-16 DIAGNOSIS — F32A Depression, unspecified: Secondary | ICD-10-CM | POA: Diagnosis present

## 2021-08-16 DIAGNOSIS — K581 Irritable bowel syndrome with constipation: Secondary | ICD-10-CM | POA: Diagnosis not present

## 2021-08-16 DIAGNOSIS — I10 Essential (primary) hypertension: Secondary | ICD-10-CM | POA: Diagnosis present

## 2021-08-16 DIAGNOSIS — K5900 Constipation, unspecified: Secondary | ICD-10-CM | POA: Diagnosis present

## 2021-08-16 DIAGNOSIS — W010XXA Fall on same level from slipping, tripping and stumbling without subsequent striking against object, initial encounter: Secondary | ICD-10-CM | POA: Diagnosis present

## 2021-08-16 DIAGNOSIS — E785 Hyperlipidemia, unspecified: Secondary | ICD-10-CM | POA: Diagnosis present

## 2021-08-16 DIAGNOSIS — Z20822 Contact with and (suspected) exposure to covid-19: Secondary | ICD-10-CM | POA: Diagnosis present

## 2021-08-16 DIAGNOSIS — N179 Acute kidney failure, unspecified: Secondary | ICD-10-CM | POA: Diagnosis not present

## 2021-08-16 DIAGNOSIS — G231 Progressive supranuclear ophthalmoplegia [Steele-Richardson-Olszewski]: Secondary | ICD-10-CM

## 2021-08-16 DIAGNOSIS — Z66 Do not resuscitate: Secondary | ICD-10-CM | POA: Diagnosis present

## 2021-08-16 DIAGNOSIS — Z96642 Presence of left artificial hip joint: Secondary | ICD-10-CM | POA: Diagnosis not present

## 2021-08-16 DIAGNOSIS — S72002A Fracture of unspecified part of neck of left femur, initial encounter for closed fracture: Secondary | ICD-10-CM

## 2021-08-16 DIAGNOSIS — R296 Repeated falls: Secondary | ICD-10-CM | POA: Diagnosis present

## 2021-08-16 DIAGNOSIS — K589 Irritable bowel syndrome without diarrhea: Secondary | ICD-10-CM | POA: Diagnosis present

## 2021-08-16 DIAGNOSIS — R52 Pain, unspecified: Secondary | ICD-10-CM

## 2021-08-16 DIAGNOSIS — G4730 Sleep apnea, unspecified: Secondary | ICD-10-CM | POA: Diagnosis present

## 2021-08-16 DIAGNOSIS — Z7189 Other specified counseling: Secondary | ICD-10-CM | POA: Diagnosis not present

## 2021-08-16 DIAGNOSIS — S72002D Fracture of unspecified part of neck of left femur, subsequent encounter for closed fracture with routine healing: Secondary | ICD-10-CM | POA: Diagnosis not present

## 2021-08-16 DIAGNOSIS — N400 Enlarged prostate without lower urinary tract symptoms: Secondary | ICD-10-CM | POA: Diagnosis present

## 2021-08-16 DIAGNOSIS — R34 Anuria and oliguria: Secondary | ICD-10-CM | POA: Diagnosis not present

## 2021-08-16 DIAGNOSIS — Y9301 Activity, walking, marching and hiking: Secondary | ICD-10-CM | POA: Diagnosis present

## 2021-08-16 DIAGNOSIS — D62 Acute posthemorrhagic anemia: Secondary | ICD-10-CM | POA: Diagnosis not present

## 2021-08-16 DIAGNOSIS — J449 Chronic obstructive pulmonary disease, unspecified: Secondary | ICD-10-CM | POA: Diagnosis present

## 2021-08-16 DIAGNOSIS — J440 Chronic obstructive pulmonary disease with acute lower respiratory infection: Secondary | ICD-10-CM | POA: Diagnosis not present

## 2021-08-16 DIAGNOSIS — I69354 Hemiplegia and hemiparesis following cerebral infarction affecting left non-dominant side: Secondary | ICD-10-CM | POA: Diagnosis not present

## 2021-08-16 DIAGNOSIS — Y95 Nosocomial condition: Secondary | ICD-10-CM | POA: Diagnosis not present

## 2021-08-16 DIAGNOSIS — Z789 Other specified health status: Secondary | ICD-10-CM

## 2021-08-16 DIAGNOSIS — K219 Gastro-esophageal reflux disease without esophagitis: Secondary | ICD-10-CM | POA: Diagnosis present

## 2021-08-16 DIAGNOSIS — J189 Pneumonia, unspecified organism: Secondary | ICD-10-CM | POA: Diagnosis not present

## 2021-08-16 DIAGNOSIS — I252 Old myocardial infarction: Secondary | ICD-10-CM | POA: Diagnosis not present

## 2021-08-16 LAB — CBC
HCT: 40.5 % (ref 39.0–52.0)
Hemoglobin: 13.5 g/dL (ref 13.0–17.0)
MCH: 31.4 pg (ref 26.0–34.0)
MCHC: 33.3 g/dL (ref 30.0–36.0)
MCV: 94.2 fL (ref 80.0–100.0)
Platelets: 192 10*3/uL (ref 150–400)
RBC: 4.3 MIL/uL (ref 4.22–5.81)
RDW: 13.7 % (ref 11.5–15.5)
WBC: 11.7 10*3/uL — ABNORMAL HIGH (ref 4.0–10.5)
nRBC: 0 % (ref 0.0–0.2)

## 2021-08-16 LAB — PROTIME-INR
INR: 2.3 — ABNORMAL HIGH (ref 0.8–1.2)
Prothrombin Time: 25.5 seconds — ABNORMAL HIGH (ref 11.4–15.2)

## 2021-08-16 LAB — RESP PANEL BY RT-PCR (FLU A&B, COVID) ARPGX2
Influenza A by PCR: NEGATIVE
Influenza B by PCR: NEGATIVE
SARS Coronavirus 2 by RT PCR: NEGATIVE

## 2021-08-16 MED ORDER — BISOPROLOL FUMARATE 5 MG PO TABS
5.0000 mg | ORAL_TABLET | Freq: Every day | ORAL | Status: DC
  Administered 2021-08-16 – 2021-08-28 (×12): 5 mg via ORAL
  Filled 2021-08-16 (×13): qty 1

## 2021-08-16 MED ORDER — PANTOPRAZOLE SODIUM 40 MG PO TBEC
40.0000 mg | DELAYED_RELEASE_TABLET | Freq: Every day | ORAL | Status: DC
  Administered 2021-08-16 – 2021-08-28 (×13): 40 mg via ORAL
  Filled 2021-08-16 (×13): qty 1

## 2021-08-16 MED ORDER — AMLODIPINE BESYLATE 2.5 MG PO TABS
2.5000 mg | ORAL_TABLET | Freq: Every day | ORAL | Status: DC
  Administered 2021-08-16 – 2021-08-28 (×12): 2.5 mg via ORAL
  Filled 2021-08-16 (×13): qty 1

## 2021-08-16 MED ORDER — MORPHINE SULFATE (PF) 4 MG/ML IV SOLN
4.0000 mg | INTRAVENOUS | Status: DC | PRN
  Administered 2021-08-16 – 2021-08-18 (×9): 4 mg via INTRAVENOUS
  Filled 2021-08-16 (×9): qty 1

## 2021-08-16 MED ORDER — AMIODARONE HCL 200 MG PO TABS
200.0000 mg | ORAL_TABLET | Freq: Every day | ORAL | Status: DC
  Administered 2021-08-16 – 2021-08-18 (×3): 200 mg via ORAL
  Filled 2021-08-16 (×3): qty 1

## 2021-08-16 MED ORDER — PRAVASTATIN SODIUM 40 MG PO TABS
80.0000 mg | ORAL_TABLET | Freq: Every day | ORAL | Status: DC
  Administered 2021-08-16 – 2021-08-28 (×13): 80 mg via ORAL
  Filled 2021-08-16 (×13): qty 2

## 2021-08-16 MED ORDER — CITALOPRAM HYDROBROMIDE 20 MG PO TABS
20.0000 mg | ORAL_TABLET | Freq: Every day | ORAL | Status: DC
  Administered 2021-08-16 – 2021-08-18 (×3): 20 mg via ORAL
  Filled 2021-08-16: qty 1
  Filled 2021-08-16: qty 2
  Filled 2021-08-16: qty 1

## 2021-08-16 MED ORDER — ACETAMINOPHEN 325 MG PO TABS
650.0000 mg | ORAL_TABLET | Freq: Four times a day (QID) | ORAL | Status: DC | PRN
  Administered 2021-08-16 (×2): 650 mg via ORAL
  Filled 2021-08-16 (×2): qty 2

## 2021-08-16 MED ORDER — MORPHINE SULFATE (PF) 4 MG/ML IV SOLN
4.0000 mg | Freq: Once | INTRAVENOUS | Status: AC
Start: 2021-08-16 — End: 2021-08-16
  Administered 2021-08-16: 4 mg via INTRAVENOUS
  Filled 2021-08-16: qty 1

## 2021-08-16 NOTE — H&P (View-Only) (Signed)
Patient ID: [name] MRN: [id number] DOB/AGE: [birth year] 76 y.o.  Admit date: 08/15/2021  Admission Diagnoses:  Principal Problem:   Closed left hip fracture, initial encounter [facility]) Active Problems:   Leg fracture, left   HPI: Ortho consulted for left displaced femoral neck fracture sustained during ground level witnessed fall on 08/15/21. The patient is currently admitted to [facility].   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  The patient is accompanied by his wife and pastor at bedside during our interview. The history provided by the patient and his wife. The patient's wife notes that he had antecedent hip and knee pain on multiple occasions for a long time prior to this most recent fall. However, he was never formally treated for any hip osteoarthritis.  Past Medical History: Past Medical History:  Diagnosis Date   BPH (benign prostatic hyperplasia)    COPD (chronic obstructive pulmonary disease) ([place]) 2021   Coronary artery disease    a. s/p PCI to LCx in 2007; b. MV 2017 no sig ischemia, EF 59%, nl study. c. Cath 01/2020 without significant recurrent dz.   Depression    GERD (gastroesophageal reflux disease)    Hyperlipidemia    Hypertension    IBS (irritable bowel syndrome)    Myocardial infarction ([place])    12-13 yrs ago    PAF (paroxysmal atrial fibrillation) ([place])    a. CHADS2VASc => 5 (HTN, age x 1, stroke x 2, vascular disease); b. on Coumadin    PNA (pneumonia)    Skin cancer    face    Sleep apnea    off cpap    Stroke ([place])    a. x 2 with residual left-sided weakness    Surgical History: Past Surgical History:  Procedure Laterality Date   [place]  2006   X1 STENT   COLONOSCOPY     CORONARY ANGIOPLASTY     POLYPECTOMY     RIGHT/LEFT HEART CATH AND CORONARY ANGIOGRAPHY N/A  01/25/2020   Procedure: RIGHT/LEFT HEART CATH AND CORONARY ANGIOGRAPHY;  Surgeon: Minna Merritts, MD;  Location: [place] [facility];  Service: Cardiovascular;  Laterality: N/A;   SKIN CANCER EXCISION N/A 08/2016   [place].  [facility] (Dr. Deliah Boston)   THYROIDECTOMY, PARTIAL     UPPER GASTROINTESTINAL ENDOSCOPY     VASCULAR SURGERY     varicose vein stripping left leg    Family History: Family History  Problem Relation Age of Onset   Heart Problems Father    Heart disease Father    Hypertension Father    Diabetes Father    Mental illness Mother    Arthritis Mother    Cancer Mother        ovarian   Stroke Paternal Uncle    Heart disease Paternal Grandfather    Cancer Maternal Aunt    Stroke Maternal Grandmother    Sudden death Maternal Grandfather    Heart disease Paternal Grandmother    Colon cancer Neg Hx    Esophageal cancer Neg Hx    Rectal cancer Neg Hx    Colon polyps Neg Hx    Stomach cancer Neg Hx     Social History: Social History   Socioeconomic History   Marital status: Married    Spouse name: Not on file   Number of children: 1  Years of education: Not on file   Highest education level: Not on file  Occupational History   Occupation: retired  Tobacco Use   Smoking status: Former    Years: 20.00    Types: Cigarettes   Smokeless tobacco: Never  Vaping Use   Vaping Use: Never used  Substance and Sexual Activity   Alcohol use: Yes    Alcohol/week: 2.0 - 3.0 standard drinks    Types: 2 - 3 Glasses of wine per week    Comment: Maybe a glass or two a week   Drug use: No   Sexual activity: Never  Other Topics Concern   Not on file  Social History Narrative   Not on file   Social Determinants of Health   Financial Resource Strain: Not on file  Food Insecurity: No Food Insecurity   Worried About Charity fundraiser in the Last Year: Never true   Ran Out of Food in the Last Year: Never true  Transportation Needs: No  Transportation Needs   Lack of Transportation (Medical): No   Lack of Transportation (Non-Medical): No  Physical Activity: Not on file  Stress: Not on file  Social Connections: Not on file  Intimate Partner Violence: Not on file    Allergies: Bactrim [sulfamethoxazole-trimethoprim], Ciprofloxacin, and Crestor [rosuvastatin calcium]  Medications: I have reviewed the patient's current medications.  Vital Signs: Patient Vitals for the past 24 hrs:  BP Temp Temp src Pulse Resp SpO2 Height Weight  08/16/21 1700 -- 99.8 F (37.7 C) Oral -- -- -- -- --  08/16/21 1422 133/86 (!) 100.4 F (38 C) Oral 64 16 93 % -- --  08/16/21 1400 120/80 -- -- 64 11 92 % -- --  08/16/21 1200 121/80 -- -- 64 20 92 % -- --  08/16/21 1030 119/77 -- -- 61 11 93 % -- --  08/16/21 0900 136/85 -- -- 64 12 94 % -- --  08/16/21 0830 123/79 -- -- 60 11 94 % -- --  08/16/21 0700 109/72 -- -- (!) 57 (!) 9 95 % -- --  08/16/21 0530 104/79 -- -- (!) 58 13 95 % -- --  08/16/21 0500 108/75 -- -- (!) 59 (!) 8 93 % -- --  08/16/21 0300 127/79 -- -- 64 13 94 % -- --  08/16/21 0200 129/80 -- -- 66 12 95 % -- --  08/16/21 0145 131/79 -- -- 65 16 94 % -- --  08/16/21 0130 135/85 -- -- 64 14 94 % -- --  08/16/21 0100 128/80 -- -- 62 12 93 % -- --  08/16/21 0030 121/78 -- -- 62 12 93 % -- --  08/16/21 0000 123/81 -- -- 61 16 94 % -- --  08/15/21 2345 123/78 -- -- 61 13 94 % -- --  08/15/21 2330 119/82 -- -- (!) 59 13 93 % -- --  08/15/21 2315 124/82 -- -- (!) 59 12 94 % -- --  08/15/21 2300 127/81 -- -- (!) 59 13 96 % -- --  08/15/21 2245 129/82 -- -- (!) 58 15 95 % -- --  08/15/21 2230 (!) 143/87 -- -- (!) 59 15 96 % -- --  08/15/21 2215 (!) 152/96 -- -- 62 20 97 % -- --  08/15/21 2200 (!) 157/85 -- -- (!) 57 16 94 % -- --  08/15/21 2130 (!) 147/94 -- -- (!) 58 15 96 % -- --  08/15/21 2100 (!) 155/91 -- -- (!) 58 14 96 %  6' (1.829 m) 81 kg  08/15/21 2045 -- 98.4 F (36.9 C) -- -- -- -- -- --  08/15/21 2040 132/80  98.4 F (36.9 C) Temporal 60 16 99 % -- --    Radiology: CT Head Wo Contrast  Result Date: 08/15/2021 CLINICAL DATA:  Head trauma. EXAM: CT HEAD WITHOUT CONTRAST TECHNIQUE: Contiguous axial images were obtained from the base of the skull through the vertex without intravenous contrast. RADIATION DOSE REDUCTION: This exam was performed according to the departmental dose-optimization program which includes automated exposure control, adjustment of the mA and/or kV according to patient size and/or use of iterative reconstruction technique. COMPARISON:  Head CT dated 07/09/2021. FINDINGS: Brain: Moderate age-related atrophy and chronic microvascular ischemic changes. There is no acute intracranial hemorrhage. No mass effect or midline shift. No extra-axial fluid collection. Vascular: No hyperdense vessel or unexpected calcification. Skull: Normal. Negative for fracture or focal lesion. Sinuses/Orbits: No acute finding. Other: None IMPRESSION: 1. No acute intracranial pathology. 2. Moderate age-related atrophy and chronic microvascular ischemic changes. Electronically Signed   By: Anner Crete M.D.   On: 08/15/2021 21:31   CT PELVIS WO CONTRAST  Result Date: 08/15/2021 CLINICAL DATA:  Trauma. EXAM: CT PELVIS WITHOUT CONTRAST TECHNIQUE: Multidetector CT imaging of the pelvis was performed following the standard protocol without intravenous contrast. RADIATION DOSE REDUCTION: This exam was performed according to the departmental dose-optimization program which includes automated exposure control, adjustment of the mA and/or kV according to patient size and/or use of iterative reconstruction technique. COMPARISON:  CT of the chest abdomen pelvis dated 01/13/2021. FINDINGS: Evaluation of this exam is limited in the absence of intravenous contrast. Urinary Tract:  No abnormality visualized. Bowel:  Unremarkable visualized pelvic bowel loops. Vascular/Lymphatic: There is atherosclerotic calcification of the  abdominal aorta and iliac arteries. No pelvic adenopathy. Reproductive: The prostate and seminal vesicles are grossly unremarkable. Other:  None Musculoskeletal: The bones are osteopenic. There is a somewhat comminuted and mildly angulated fracture of the left femoral neck extending from the subcapital to the intertrochanteric region. There is mild there is angulation. No dislocation. IMPRESSION: Mildly comminuted and mildly angulated fracture of the left femoral neck. Aortic Atherosclerosis (ICD10-I70.0). Electronically Signed   By: Anner Crete M.D.   On: 08/15/2021 22:22   DG SWALLOW FUNC OP MEDICARE SPEECH PATH  Result Date: 07/23/2021 Table formatting from the original result was not included. Objective Swallowing Evaluation: Type of Study: MBS-Modified Barium Swallow Study  Patient Details Name: Garren Greenman MRN: [id number] Date of Birth: May 03, 1946 Today's Date: 07/23/2021 Time: SLP Start Time (ACUTE ONLY): 1140 -SLP Stop Time (ACUTE ONLY): 1200 SLP Time Calculation (min) (ACUTE ONLY): 20 min Past Medical History: Past Medical History: Diagnosis Date  BPH (benign prostatic hyperplasia)   COPD (chronic obstructive pulmonary disease) ([place]) 2021  Coronary artery disease   a. s/p PCI to LCx in 2007; b. MV 2017 no sig ischemia, EF 59%, nl study. c. Cath 01/2020 without significant recurrent dz.  Depression   GERD (gastroesophageal reflux disease)   Hyperlipidemia   Hypertension   IBS (irritable bowel syndrome)   Myocardial infarction ([place])   12-13 yrs ago   PAF (paroxysmal atrial fibrillation) ([place])   a. CHADS2VASc => 5 (HTN, age x 1, stroke x 2, vascular disease); b. on Coumadin   PNA (pneumonia)   Skin cancer   face   Sleep apnea   off cpap   Stroke [facility])   a. x 2 with residual left-sided weakness Past Surgical History: Past Surgical  History: Procedure Laterality Date  ADENOIDECTOMY  1951  CARDIAC CATHETERIZATION  2006  X1 STENT  COLONOSCOPY    CORONARY ANGIOPLASTY    POLYPECTOMY    RIGHT/LEFT HEART CATH  AND CORONARY ANGIOGRAPHY N/A 01/25/2020  Procedure: RIGHT/LEFT HEART CATH AND CORONARY ANGIOGRAPHY;  Surgeon: Minna Merritts, MD;  Location: [place] [facility];  Service: Cardiovascular;  Laterality: N/A;  SKIN CANCER EXCISION N/A 08/2016  [place].  [facility] (Dr. Deliah Boston)  THYROIDECTOMY, PARTIAL    UPPER GASTROINTESTINAL ENDOSCOPY    VASCULAR SURGERY    varicose vein stripping left leg HPI: Yoandri Congrove is a 76 y.o. male with PMH: COPD, depression, GERD, IBS, CVA and has had frequent falls (falling backwards). Per wife he has a recent diagnosis of probable PSP. Wife provided history, stating that he will have throat clearing during or after meals at tiems, poor appetite. She denies him exhibiting frequent episodes of dysphagia.  Subjective: pleasant, wife providees history  Recommendations for follow up therapy are one component of a multi-disciplinary discharge planning process, led by the attending physician.  Recommendations may be updated based on patient status, additional functional criteria and insurance authorization. Assessment / Plan / Recommendation Clinical Impressions 07/23/2021 Clinical Impression Patient presents with a mild oropharyngeal dysphagia as per this MBS. Epiglottis appeared rigid but adequately moved to protect airway during swallows. Consistencies tested included puree solids, thin liquids, regular solids and barium tablet. No instances of penetration or aspiration observed with any tested consistency. He exhibited a mild delay in mastication with regular solids and had difficulty with bolus cohesion with thin liquid and barium tablet. With thin liquids, swallow initiation delayed at level of vallecular sinus, but full clearance of bolus through pharynx and upper esophagus. Puree solids, regular solids and barium tablet all transited through pharynx and upper esophagus without difficulty. Prominent cricopharyngeal bar observed but did not impede bolus transit.  Patient and wife both educated on results of MBS after. SLP Visit Diagnosis Dysphagia, oropharyngeal phase (R13.12) Attention and concentration deficit following -- Frontal lobe and executive function deficit following -- Impact on safety and function No limitations;Mild aspiration risk   Treatment Recommendations 07/23/2021 Treatment Recommendations No treatment recommended at this time   No flowsheet data found. Diet Recommendations 07/23/2021 SLP Diet Recommendations Regular solids;Thin liquid Liquid Administration via Cup;Straw Medication Administration Whole meds with liquid Compensations -- Postural Changes --   Other Recommendations 07/23/2021 Recommended Consults -- Oral Care Recommendations -- Other Recommendations -- Follow Up Recommendations No SLP follow up Assistance recommended at discharge -- Functional Status Assessment -- No flowsheet data found.  Oral Phase 07/23/2021 Oral Phase Impaired Oral - Pudding Teaspoon -- Oral - Pudding Cup -- Oral - Honey Teaspoon -- Oral - Honey Cup -- Oral - Nectar Teaspoon -- Oral - Nectar Cup -- Oral - Nectar Straw -- Oral - Thin Teaspoon -- Oral - Thin Cup WFL Oral - Thin Straw WFL Oral - Puree WFL Oral - Mech Soft -- Oral - Regular Impaired mastication Oral - Multi-Consistency -- Oral - Pill Decreased bolus cohesion;Delayed oral transit Oral Phase - Comment --  Pharyngeal Phase 07/23/2021 Pharyngeal Phase Impaired Pharyngeal- Pudding Teaspoon -- Pharyngeal -- Pharyngeal- Pudding Cup -- Pharyngeal -- Pharyngeal- Honey Teaspoon -- Pharyngeal -- Pharyngeal- Honey Cup -- Pharyngeal -- Pharyngeal- Nectar Teaspoon -- Pharyngeal -- Pharyngeal- Nectar Cup -- Pharyngeal -- Pharyngeal- Nectar Straw -- Pharyngeal -- Pharyngeal- Thin Teaspoon -- Pharyngeal -- Pharyngeal- Thin Cup Delayed swallow initiation-vallecula Pharyngeal -- Pharyngeal- Thin Straw Delayed swallow  initiation-vallecula Pharyngeal -- Pharyngeal- Puree WFL Pharyngeal -- Pharyngeal- Mechanical Soft -- Pharyngeal --  Pharyngeal- Regular WFL Pharyngeal -- Pharyngeal- Multi-consistency -- Pharyngeal -- Pharyngeal- Pill Delayed swallow initiation-pyriform sinuses Pharyngeal -- Pharyngeal Comment --  Cervical Esophageal Phase  07/23/2021 Cervical Esophageal Phase WFL Pudding Teaspoon -- Pudding Cup -- Honey Teaspoon -- Honey Cup -- Nectar Teaspoon -- Nectar Cup -- Nectar Straw -- Thin Teaspoon -- Thin Cup -- Thin Straw -- Puree -- Mechanical Soft -- Regular -- Multi-consistency -- Pill -- Cervical Esophageal Comment -- Sonia Baller, MA, CCC-SLP Speech Therapy       CLINICAL DATA:  Dysphagia, unspecified type. Additional history provided: Dysphagia with solid foods, worse over the last 2 weeks. EXAM: MODIFIED BARIUM SWALLOW TECHNIQUE: Different consistencies of barium were administered orally to the patient by the Speech Pathologist. Imaging of the pharynx was performed in the lateral projection. Candiss Norse, PA was present in the fluoroscopy room for this study, providing personal supervision. FLUOROSCOPY: Fluoroscopy Time:  48 seconds Radiation Exposure Index (if provided by the fluoroscopic device): 4.80 mGy COMPARISON:  None FINDINGS: Different consistencies of barium were administered orally to the patient by the Speech Pathologist with fluoroscopic imaging of the pharynx from a lateral projection. Candiss Norse, PA was present in the fluoroscopy room for this study, providing personal supervision. The Speech Pathologist did not observe aspiration. Please refer to the Speech Pathologist's report for full details. IMPRESSION: Modified barium swallow, as described. No aspiration was observed. Please refer to the Speech Pathologist's report for complete details and recommendations. Electronically Signed   By: Kellie Simmering D.O.   On: 07/23/2021 12:06   DG Chest Portable 1 View  Result Date: 08/15/2021 CLINICAL DATA:  Fall injury. EXAM: PORTABLE CHEST 1 VIEW COMPARISON:  Portable chest 07/09/2021 FINDINGS: There is  mild cardiomegaly. No evidence of CHF or pleural collections. Aortic atherosclerosis with stable mediastinum. The lungs are clear. Osteopenia. Thoracic spondylosis. There is overlying monitor wiring. IMPRESSION: 1. No evidence of acute chest process. Stable chest with mild cardiomegaly. 2. Aortic atherosclerosis. Electronically Signed   By: Telford Nab M.D.   On: 08/15/2021 21:24   DG Hip Unilat W or Wo Pelvis 2-3 Views Left  Result Date: 08/15/2021 CLINICAL DATA:  fall EXAM: DG HIP (WITH OR WITHOUT PELVIS) 2-3V LEFT.  Patient is rotated. COMPARISON:  None. FINDINGS: Query cortical lucency and irregularity of the neck and greater trochanter of the left hip. There is no evidence of definite acute displaced fracture or dislocation of the left hip. Frontal view of the right hip grossly unremarkable. No definite acute displaced fracture of the pelvis. Unable to evaluate for pelvic diastasis due to patient rotation. There is no evidence of severe arthropathy or other focal bone abnormality. Vascular calcifications. IMPRESSION: 1. No definite acute displaced fracture or dislocation of the left hip. 2. Query cortical lucency and irregularity of the neck and greater trochanter of the left hip. Consider CT for further evaluation if clinically indicated. Electronically Signed   By: Iven Finn M.D.   On: 08/15/2021 21:26    Labs: Recent Labs    08/15/21 2045 08/16/21 0240  WBC 8.1 11.7*  RBC 4.75 4.30  HCT 43.9 40.5  PLT 206 192   Recent Labs    08/15/21 2045  NA 139  K 3.9  CL 104  CO2 25  BUN 11  CREATININE 1.05  GLUCOSE 107*  CALCIUM 8.9   Recent Labs    08/15/21 2045 08/16/21 0240  INR 2.2*  2.3*    Review of Systems: ROS detailed in HPI  Physical Exam: Body mass index is 24.22 kg/m.  Gen: AAOx3, NAD Comfortable at rest  Left Lower Extremity: Skin intact TTP over left hip, pain with log roll ADF/APF/EHL intact (motor exam limited by pain) SILT throughout DP, PT 2+ to  palp CR < 2s    Assessment and Plan: 81 yr M consult to Ortho for left displaced femoral neck fracture in the setting of therapeutic anticoagulation and several comorbidities  -reviewed history, exam and imaging at length with patient and his wife -he would benefit from left hemi vs total hip arthroplasty  -he will require medical optimization and clearance from his primary team -goal INR <2 prior to surgery; today's INR is 2.3; patient is being transitioned to heparin infusion per notes -will coordinate with OR control desk and primary team to establish best timing for surgery -in the meantime, NWB LLE and minimize log roll  Armond Hang, MD Orthopaedic Surgeon [facility] [phone]

## 2021-08-16 NOTE — Consult Note (Cosign Needed)
Consultation Note Date: 08/16/2021   [name] Name: [name]  DOB: 07-22-1945  MRN: [id number]  Age / Sex: 76 y.o., male  PCP: William Dad, MD Referring Physician: Kinnie Feil, MD  Reason for Consultation: Establishing goals of care, "Recent diagnosis of progressive supranuclear palsy leading to fall"  HPI/[name] Profile: 76 y.o. 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 [name] was admitted on 08/15/2021 with left femoral neck fracture.   Clinical Assessment and Goals of Care: I have reviewed medical records including EPIC notes, labs, and imaging. Received report from primary RN - no acute concerns.   Went to visit [name] at bedside - wife/[name] present. [name] was lying in bed awake, alert, oriented, and able to participate in conversation. He does intermittently fall asleep during visit due to recently receiving pain medication. No signs or non-verbal gestures of pain or discomfort noted. No respiratory distress, increased work of breathing, or secretions noted. [name] denies pain at this time but states he "has felt better."   Met with [name] and [name]  to discuss diagnosis, prognosis, GOC, EOL wishes, disposition, and options.  I introduced Palliative Medicine as specialized medical care for people living with serious illness. It focuses on providing relief from the symptoms and stress of a serious illness. The goal is to improve quality of life for both the [name] and the family.  We discussed a brief life review of the [name] as well as functional and nutritional status. [name] and his wife have been married 73 years - they have 1 son together. Prior to  hospitalization, [name] was living in a private residence with his wife and dog. [name] was diagnosed with Parkinson's on July 4th, 2021; however, after levodopa was not effective he was then diagnosed with PSP earlier this year. At home, despite PT/OT intervention, wife states [name] has continued a steady physical decline. He has recurrent falls (falls backward). He uses a walker and requires assistance to ambulate. He also requires assistance with dressing/bathing. [name] has acquired, in addition to a walker, a wheelchair, potty chair, shower chair, and hospital bed to help care for [name] at home. [name] tells me [name] has a good appetite; however, has a hard time with tremors and feeding himself. She tells me he does have "noticeable aspiration" occasionally, but not often. [name] also is still currently managing the finances of their home; he recently has had increasing difficulty and their son is assisting.  We discussed [name]'s current illness and what it means in the larger context of [name]'s on-going co-morbidities. [name] and [name] understand that COPD and PSP are progressive, non-curable diseases underlying the [name]'s current acute medical conditions. Natural disease trajectory discussed: characteristics include, but not limited to, postural instability and unexplained falls as well as dysphagia and dysarthria. I attempted to elicit values and goals of  care important to the [name]. The difference between aggressive medical intervention and comfort care was considered in light of the [name]'s goals of care. [name] is willing to proceed with surgical intervention for his hip fracture. He is open for discharge to rehab vs HHPT/OT on discharge.  Education was provided that there are no treatments that alter the natural disease process of PSP and no medications that provide significant symptomatic benefits as seen with levodopa in Parkinson's diease. However, we did discuss  nonpharmacologic supportive measures to include: dietitians/SLP to help manage dysphagia, early PT/OT to promote longer independence/ADLs, and outpatient Palliative Care for continued GOC as disease progresses. Prognostication was also reviewed. Education provided that progression occurs fairly rapidly and most [name]'s become dependent in care within 3-4 years of presentation ([name] is 2-3 years into symptoms now). Also discussed how quality of life can be significantly reduced. Encouraged early advanced care planning. [name] and wife expressed understanding and are appreciative of information discussed.   Discussed long term goals with [name]/wife - wife is clear she will be primary caregiver to [name] until he passes, she will not place him in LTC. We discussed caregiver support options to include private duty caregivers as well as additional DME such as lift equipment - she states these resources she will likely utilize in the future, but does not feel she needs them now.    Outpatient Palliative Care services were explained and offered - [name]/wife agreeable.  Reviewed how to access [facility].   Offered chaplain support- [name]'s pastor has already been to visit today.  Discussed with [name]/family the importance of continued conversation with each other and the medical providers regarding overall plan of care and treatment options, ensuring decisions are within the context of the patients values and GOCs.    Questions and concerns were addressed. The [name]/family was encouraged to call with questions and/or concerns. PMT card was provided.    Primary Decision Maker: [name]    SUMMARY OF RECOMMENDATIONS   Proceed with surgical intervention for left femoral neck fracture  Continue DNR/DNI as previously documented [name] is open for discharge to rehab vs HHPT/OT post-op Education provided on anticipated trajectory of PSP TOC consulted for: outpatient Palliative  Care referral PMT will continue to follow and support holistically  Code Status/Advance Care Planning: DNR  Palliative Prophylaxis:  Aspiration, Bowel Regimen, Delirium Protocol, Frequent Pain Assessment, Oral Care, and Turn Reposition  Additional Recommendations (Limitations, Scope, Preferences): Full Scope Treatment  Psycho-social/Spiritual:  Desire for further Chaplaincy support:no Created space and opportunity for [name] and family to express thoughts and feelings regarding [name]'s current medical situation.  Emotional support and therapeutic listening provided.  Prognosis:  Unable to determine  Discharge Planning: likely rehab with outpatient Palliative Care to follow     Primary Diagnoses: Present on Admission:  Closed left hip fracture, initial encounter ([place])  Leg fracture, left   I have reviewed the medical record, interviewed the [name] and family, and examined the [name]. The following aspects are pertinent.  Past Medical History:  Diagnosis Date   BPH (benign prostatic hyperplasia)    COPD (chronic obstructive pulmonary disease) ([place]) 2021   Coronary artery disease    a. s/p PCI to LCx in 2007; b. MV 2017 no sig ischemia, EF 59%, nl study. c. Cath 01/2020 without significant recurrent dz.   Depression    GERD (gastroesophageal reflux disease)    Hyperlipidemia    Hypertension    IBS (irritable bowel syndrome)    Myocardial infarction (  [place])    12-13 yrs ago    PAF (paroxysmal atrial fibrillation) ([place])    a. CHADS2VASc => 5 (HTN, age x 1, stroke x 2, vascular disease); b. on Coumadin    PNA (pneumonia)    Skin cancer    face    Sleep apnea    off cpap    Stroke ([place])    a. x 2 with residual left-sided weakness   Social History   Socioeconomic History   Marital status: Married    Spouse name: Not on file   Number of children: 1   Years of education: Not on file   Highest education level: Not on file  Occupational History   Occupation:  retired  Tobacco Use   Smoking status: Former    Years: 20.00    Types: Cigarettes   Smokeless tobacco: Never  Vaping Use   Vaping Use: Never used  Substance and Sexual Activity   Alcohol use: Yes    Alcohol/week: 2.0 - 3.0 standard drinks    Types: 2 - 3 Glasses of wine per week    Comment: Maybe a glass or two a week   Drug use: No   Sexual activity: Never  Other Topics Concern   Not on file  Social History Narrative   Not on file   Social Determinants of Health   Financial Resource Strain: Not on file  Food Insecurity: No Food Insecurity   Worried About Charity fundraiser in the Last Year: Never true   Ran Out of Food in the Last Year: Never true  Transportation Needs: No Transportation Needs   Lack of Transportation (Medical): No   Lack of Transportation (Non-Medical): No  Physical Activity: Not on file  Stress: Not on file  Social Connections: Not on file   Family History  Problem Relation Age of Onset   Heart Problems Father    Heart disease Father    Hypertension Father    Diabetes Father    Mental illness Mother    Arthritis Mother    Cancer Mother        ovarian   Stroke Paternal Uncle    Heart disease Paternal Grandfather    Cancer Maternal Aunt    Stroke Maternal Grandmother    Sudden death Maternal Grandfather    Heart disease Paternal Grandmother    Colon cancer Neg Hx    Esophageal cancer Neg Hx    Rectal cancer Neg Hx    Colon polyps Neg Hx    Stomach cancer Neg Hx    Scheduled Meds:  amiodarone  200 mg Oral Daily   amLODipine  2.5 mg Oral Daily   bisoprolol  5 mg Oral Daily   citalopram  20 mg Oral Daily   pantoprazole  40 mg Oral Daily   pravastatin  80 mg Oral Daily   Continuous Infusions: PRN Meds:.acetaminophen, morphine injection Medications Prior to Admission:  Prior to Admission medications   Medication Sig Start Date End Date Taking? Authorizing Provider  acetaminophen (TYLENOL) 325 MG tablet Take 2 tablets (650 mg total) by  mouth every 6 (six) hours as needed for mild pain or moderate pain. 01/14/21   Sharion Settler, DO  amiodarone (PACERONE) 200 MG tablet Take 1 tablet (200 mg total) by mouth daily. 11/25/20   Jettie Booze, MD  amiodarone (PACERONE) 200 MG tablet Take 1 tablet (200 mg total) by mouth daily. 04/08/21   Jettie Booze, MD  amLODipine (NORVASC) 2.5 MG tablet  Take 1 tablet (2.5 mg total) by mouth daily. 06/20/21   Jettie Booze, MD  aspirin 81 MG tablet Take 81 mg by mouth daily with supper.    [provider]  bisoprolol (ZEBETA) 10 MG tablet TAKE 1/2 TABLET BY MOUTH DAILY 07/16/21   Sharion Settler, DO  bisoprolol (ZEBETA) 5 MG tablet Take 0.5 tablets (2.5 mg total) by mouth daily. 01/15/21   Sharion Settler, DO  citalopram (CELEXA) 40 MG tablet TAKE 1 TABLET BY MOUTH EVERY DAY 06/06/21   William Dad, MD  diphenhydramine-acetaminophen (TYLENOL PM) 25-500 MG TABS tablet Take 2 tablets by mouth at bedtime as needed (leg pain).    [provider]  fluticasone (FLONASE) 50 MCG/ACT nasal spray Place 1 spray into both nostrils at bedtime as needed (stuffy nose). 05/20/20   [provider]  lisinopril (ZESTRIL) 40 MG tablet Take 1 tablet (40 mg total) by mouth daily with supper. For blood pressure. 01/14/21   Sharion Settler, DO  Multiple Vitamins-Minerals (MULTIVITAMIN WITH MINERALS) tablet Take 1 tablet by mouth daily with supper. Centrum Silver    [provider]  nitroGLYCERIN (NITROSTAT) 0.4 MG SL tablet Place 1 tablet (0.4 mg total) every 5 (five) minutes as needed under the tongue for chest pain. 04/16/17   Leone Haven, MD  omeprazole (PRILOSEC) 40 MG capsule TAKE 1 CAPSULE (40 MG TOTAL) BY MOUTH 2 (TWO) TIMES DAILY BEFORE A MEAL. FOR HEARTBURN. 04/11/21   William Dad, MD  ondansetron (ZOFRAN-ODT) 4 MG disintegrating tablet TAKE 1 TABLET BY MOUTH EVERY 8 HOURS AS NEEDED FOR NAUSEA AND VOMITING 03/20/21   William Dad, MD   PRALUENT 75 MG/ML SOAJ INJECT 75 MG INTO THE SKIN EVERY 14 (FOURTEEN) DAYS. 08/01/21   Jettie Booze, MD  pramipexole (MIRAPEX) 0.25 MG tablet Take 1 tablet (0.25 mg total) by mouth at bedtime. 07/02/21   Tat, Eustace Quail, DO  pravastatin (PRAVACHOL) 80 MG tablet Take 1 tablet (80 mg total) by mouth daily. For cholesterol. 03/22/20   Minna Merritts, MD  psyllium (HYDROCIL/METAMUCIL) 95 % PACK Take 1 packet by mouth daily.    [provider]  senna (SENOKOT) 8.6 MG TABS tablet Take 1 tablet (8.6 mg total) by mouth daily. For constipation 03/07/21   William Dad, MD  solifenacin (VESICARE) 5 MG tablet Take 5 mg by mouth daily. 06/20/21   [provider]  tiZANidine (ZANAFLEX) 4 MG tablet TAKE 1 TABLET BY MOUTH AT BEDTIME AS NEEDED FOR MUSCLE SPASMS. 08/05/21   Lilland, Alana, DO  triamcinolone cream (KENALOG) 0.1 % Apply 1 application topically daily as needed (rash around nose).    [provider]  vitamin B-12 (CYANOCOBALAMIN) 1000 MCG tablet Take 1,000 mcg by mouth daily with supper.    [provider]  warfarin (COUMADIN) 7.5 MG tablet Take 1 tablet (7.39m ) by mouth on Sun and Thurs and all other days take 1/2 tablet (3.763m at supper time. 06/03/21   VaJettie BoozeMD   Allergies  Allergen Reactions   Bactrim [Sulfamethoxazole-Trimethoprim] Anaphylaxis    Ulcers   Ciprofloxacin Swelling    Tongue   Crestor [Rosuvastatin Calcium]     Severe Myalgias   Review of Systems  Constitutional:  Positive for activity change and fatigue. Negative for appetite change.  HENT:  Positive for trouble swallowing (at times).   Respiratory:  Negative for shortness of breath.   Gastrointestinal:  Negative for nausea and vomiting.  Neurological:  Positive for tremors and weakness.  All other systems reviewed and are negative.  Physical Exam Vitals and nursing note reviewed.  Constitutional:      General: He is not in acute distress. Pulmonary:      Effort: No respiratory distress.  Skin:    General: Skin is warm and dry.  Neurological:     Mental Status: He is alert and oriented to person, place, and time.     Motor: Weakness present.  Psychiatric:        Attention and Perception: Attention normal.        Behavior: Behavior is cooperative.        Cognition and Memory: Cognition and memory normal.    Vital Signs: BP 133/86 (BP Location: Right Arm)    Pulse 64    Temp (!) 100.4 F (38 C) (Oral)    Resp 16    Ht 6' (1.829 m)    Wt 81 kg    SpO2 93%    BMI 24.22 kg/m  Pain Scale: 0-10   Pain Score: 7    SpO2: SpO2: 93 % O2 Device:SpO2: 93 % O2 Flow Rate: .   IO: Intake/output summary:  Intake/Output Summary (Last 24 hours) at 08/16/2021 1532 Last data filed at 08/15/2021 2140 Gross per 24 hour  Intake 100 ml  Output 0 ml  Net 100 ml    LBM:   Baseline Weight: Weight: 81 kg Most recent weight: Weight: 81 kg     Palliative Assessment/Data: PPS 30%     Time In: 1530 Time Out: 1700 Time Total: 90 minutes  Greater than 50%  of this time was spent counseling and coordinating care related to the above assessment and plan.  Signed by: Lin Landsman, NP   Please contact Palliative Medicine Team phone at [phone] for questions and concerns.  For individual provider: See Shea Evans

## 2021-08-16 NOTE — Plan of Care (Signed)

## 2021-08-16 NOTE — ED Notes (Signed)
Received verbal report from Brittany O RN at this time 

## 2021-08-16 NOTE — ED Notes (Signed)
Provider at bedside

## 2021-08-16 NOTE — Consult Note (Addendum)
Patient ID: [name] MRN: [id number] DOB/AGE: 1945-07-24 76 y.o.  Admit date: 08/15/2021  Admission Diagnoses:  Principal Problem:   Closed left hip fracture, initial encounter [facility]) Active Problems:   Leg fracture, left   HPI: Ortho consulted for left displaced femoral neck fracture sustained during ground level witnessed fall on 08/15/21. The patient is currently admitted to [facility]   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  The patient is accompanied by his wife and pastor at bedside during our interview. The history provided by the patient and his wife. The patient's wife notes that he had antecedent hip and knee pain on multiple occasions for a long time prior to this most recent fall. However, he was never formally treated for any hip osteoarthritis.  Past Medical History: Past Medical History:  Diagnosis Date   BPH (benign prostatic hyperplasia)    COPD (chronic obstructive pulmonary disease) ([place]) 2021   Coronary artery disease    a. s/p PCI to LCx in 2007; b. MV 2017 no sig ischemia, EF 59%, nl study. c. Cath 01/2020 without significant recurrent dz.   Depression    GERD (gastroesophageal reflux disease)    Hyperlipidemia    Hypertension    IBS (irritable bowel syndrome)    Myocardial infarction ([place])    12-13 yrs ago    PAF (paroxysmal atrial fibrillation) ([place])    a. CHADS2VASc => 5 (HTN, age x 1, stroke x 2, vascular disease); b. on Coumadin    PNA (pneumonia)    Skin cancer    face    Sleep apnea    off cpap    Stroke ([place])    a. x 2 with residual left-sided weakness    Surgical History: Past Surgical History:  Procedure Laterality Date   [place]  2006   X1 STENT   COLONOSCOPY     CORONARY ANGIOPLASTY     POLYPECTOMY     RIGHT/LEFT HEART CATH AND CORONARY ANGIOGRAPHY N/A  01/25/2020   Procedure: RIGHT/LEFT HEART CATH AND CORONARY ANGIOGRAPHY;  Surgeon: Minna Merritts, MD;  Location: [facility];  Service: Cardiovascular;  Laterality: N/A;   SKIN CANCER EXCISION N/A 08/2016   [place].  [facility] (Dr. Deliah Boston)   THYROIDECTOMY, PARTIAL     UPPER GASTROINTESTINAL ENDOSCOPY     VASCULAR SURGERY     varicose vein stripping left leg    Family History: Family History  Problem Relation Age of Onset   Heart Problems Father    Heart disease Father    Hypertension Father    Diabetes Father    Mental illness Mother    Arthritis Mother    Cancer Mother        ovarian   Stroke Paternal Uncle    Heart disease Paternal Grandfather    Cancer Maternal Aunt    Stroke Maternal Grandmother    Sudden death Maternal Grandfather    Heart disease Paternal Grandmother    Colon cancer Neg Hx    Esophageal cancer Neg Hx    Rectal cancer Neg Hx    Colon polyps Neg Hx    Stomach cancer Neg Hx     Social History: Social History   Socioeconomic History   Marital status: Married    Spouse name: Not on file   Number of children: 1  Years of education: Not on file   Highest education level: Not on file  Occupational History   Occupation: retired  Tobacco Use   Smoking status: Former    Years: 20.00    Types: Cigarettes   Smokeless tobacco: Never  Vaping Use   Vaping Use: Never used  Substance and Sexual Activity   Alcohol use: Yes    Alcohol/week: 2.0 - 3.0 standard drinks    Types: 2 - 3 Glasses of wine per week    Comment: Maybe a glass or two a week   Drug use: No   Sexual activity: Never  Other Topics Concern   Not on file  Social History Narrative   Not on file   Social Determinants of Health   Financial Resource Strain: Not on file  Food Insecurity: No Food Insecurity   Worried About Charity fundraiser in the Last Year: Never true   Ran Out of Food in the Last Year: Never true  Transportation Needs: No  Transportation Needs   Lack of Transportation (Medical): No   Lack of Transportation (Non-Medical): No  Physical Activity: Not on file  Stress: Not on file  Social Connections: Not on file  Intimate Partner Violence: Not on file    Allergies: Bactrim [sulfamethoxazole-trimethoprim], Ciprofloxacin, and Crestor [rosuvastatin calcium]  Medications: I have reviewed the patient's current medications.  Vital Signs: Patient Vitals for the past 24 hrs:  BP Temp Temp src Pulse Resp SpO2 Height Weight  08/16/21 1700 -- 99.8 F (37.7 C) Oral -- -- -- -- --  08/16/21 1422 133/86 (!) 100.4 F (38 C) Oral 64 16 93 % -- --  08/16/21 1400 120/80 -- -- 64 11 92 % -- --  08/16/21 1200 121/80 -- -- 64 20 92 % -- --  08/16/21 1030 119/77 -- -- 61 11 93 % -- --  08/16/21 0900 136/85 -- -- 64 12 94 % -- --  08/16/21 0830 123/79 -- -- 60 11 94 % -- --  08/16/21 0700 109/72 -- -- (!) 57 (!) 9 95 % -- --  08/16/21 0530 104/79 -- -- (!) 58 13 95 % -- --  08/16/21 0500 108/75 -- -- (!) 59 (!) 8 93 % -- --  08/16/21 0300 127/79 -- -- 64 13 94 % -- --  08/16/21 0200 129/80 -- -- 66 12 95 % -- --  08/16/21 0145 131/79 -- -- 65 16 94 % -- --  08/16/21 0130 135/85 -- -- 64 14 94 % -- --  08/16/21 0100 128/80 -- -- 62 12 93 % -- --  08/16/21 0030 121/78 -- -- 62 12 93 % -- --  08/16/21 0000 123/81 -- -- 61 16 94 % -- --  08/15/21 2345 123/78 -- -- 61 13 94 % -- --  08/15/21 2330 119/82 -- -- (!) 59 13 93 % -- --  08/15/21 2315 124/82 -- -- (!) 59 12 94 % -- --  08/15/21 2300 127/81 -- -- (!) 59 13 96 % -- --  08/15/21 2245 129/82 -- -- (!) 58 15 95 % -- --  08/15/21 2230 (!) 143/87 -- -- (!) 59 15 96 % -- --  08/15/21 2215 (!) 152/96 -- -- 62 20 97 % -- --  08/15/21 2200 (!) 157/85 -- -- (!) 57 16 94 % -- --  08/15/21 2130 (!) 147/94 -- -- (!) 58 15 96 % -- --  08/15/21 2100 (!) 155/91 -- -- (!) 58 14 96 %  6' (1.829 m) 81 kg  08/15/21 2045 -- 98.4 F (36.9 C) -- -- -- -- -- --  08/15/21 2040 132/80  98.4 F (36.9 C) Temporal 60 16 99 % -- --    Radiology: CT Head Wo Contrast  Result Date: 08/15/2021 CLINICAL DATA:  Head trauma. EXAM: CT HEAD WITHOUT CONTRAST TECHNIQUE: Contiguous axial images were obtained from the base of the skull through the vertex without intravenous contrast. RADIATION DOSE REDUCTION: This exam was performed according to the departmental dose-optimization program which includes automated exposure control, adjustment of the mA and/or kV according to patient size and/or use of iterative reconstruction technique. COMPARISON:  Head CT dated 07/09/2021. FINDINGS: Brain: Moderate age-related atrophy and chronic microvascular ischemic changes. There is no acute intracranial hemorrhage. No mass effect or midline shift. No extra-axial fluid collection. Vascular: No hyperdense vessel or unexpected calcification. Skull: Normal. Negative for fracture or focal lesion. Sinuses/Orbits: No acute finding. Other: None IMPRESSION: 1. No acute intracranial pathology. 2. Moderate age-related atrophy and chronic microvascular ischemic changes. Electronically Signed   By: Anner Crete M.D.   On: 08/15/2021 21:31   CT PELVIS WO CONTRAST  Result Date: 08/15/2021 CLINICAL DATA:  Trauma. EXAM: CT PELVIS WITHOUT CONTRAST TECHNIQUE: Multidetector CT imaging of the pelvis was performed following the standard protocol without intravenous contrast. RADIATION DOSE REDUCTION: This exam was performed according to the departmental dose-optimization program which includes automated exposure control, adjustment of the mA and/or kV according to patient size and/or use of iterative reconstruction technique. COMPARISON:  CT of the chest abdomen pelvis dated 01/13/2021. FINDINGS: Evaluation of this exam is limited in the absence of intravenous contrast. Urinary Tract:  No abnormality visualized. Bowel:  Unremarkable visualized pelvic bowel loops. Vascular/Lymphatic: There is atherosclerotic calcification of the  abdominal aorta and iliac arteries. No pelvic adenopathy. Reproductive: The prostate and seminal vesicles are grossly unremarkable. Other:  None Musculoskeletal: The bones are osteopenic. There is a somewhat comminuted and mildly angulated fracture of the left femoral neck extending from the subcapital to the intertrochanteric region. There is mild there is angulation. No dislocation. IMPRESSION: Mildly comminuted and mildly angulated fracture of the left femoral neck. Aortic Atherosclerosis (ICD10-I70.0). Electronically Signed   By: Anner Crete M.D.   On: 08/15/2021 22:22   DG SWALLOW FUNC OP MEDICARE SPEECH PATH  Result Date: 07/23/2021 Table formatting from the original result was not included. Objective Swallowing Evaluation: Type of Study: MBS-Modified Barium Swallow Study  Patient Details Name: [name] MRN: [id number] Date of Birth: Oct 07, 1945 Today's Date: 07/23/2021 Time: SLP Start Time (ACUTE ONLY): 1140 -SLP Stop Time (ACUTE ONLY): 1200 SLP Time Calculation (min) (ACUTE ONLY): 20 min Past Medical History: Past Medical History: Diagnosis Date  BPH (benign prostatic hyperplasia)   COPD (chronic obstructive pulmonary disease) ([place]) 2021  Coronary artery disease   a. s/p PCI to LCx in 2007; b. MV 2017 no sig ischemia, EF 59%, nl study. c. Cath 01/2020 without significant recurrent dz.  Depression   GERD (gastroesophageal reflux disease)   Hyperlipidemia   Hypertension   IBS (irritable bowel syndrome)   Myocardial infarction ([place])   12-13 yrs ago   PAF (paroxysmal atrial fibrillation) ([place])   a. CHADS2VASc => 5 (HTN, age x 1, stroke x 2, vascular disease); b. on Coumadin   PNA (pneumonia)   Skin cancer   face   Sleep apnea   off cpap   Stroke [facility])   a. x 2 with residual left-sided weakness Past Surgical History: Past Surgical  History: Procedure Laterality Date  ADENOIDECTOMY  1951  CARDIAC CATHETERIZATION  2006  X1 STENT  COLONOSCOPY    CORONARY ANGIOPLASTY    POLYPECTOMY    RIGHT/LEFT HEART CATH  AND CORONARY ANGIOGRAPHY N/A 01/25/2020  Procedure: RIGHT/LEFT HEART CATH AND CORONARY ANGIOGRAPHY;  Surgeon: Minna Merritts, MD;  Location: [facility];  Service: Cardiovascular;  Laterality: N/A;  SKIN CANCER EXCISION N/A 08/2016  [place].  [facility] (Dr. Deliah Boston)  THYROIDECTOMY, PARTIAL    UPPER GASTROINTESTINAL ENDOSCOPY    VASCULAR SURGERY    varicose vein stripping left leg HPI: [name] is a 76 y.o. male with PMH: COPD, depression, GERD, IBS, CVA and has had frequent falls (falling backwards). Per wife he has a recent diagnosis of probable PSP. Wife provided history, stating that he will have throat clearing during or after meals at tiems, poor appetite. She denies him exhibiting frequent episodes of dysphagia.  Subjective: pleasant, wife providees history  Recommendations for follow up therapy are one component of a multi-disciplinary discharge planning process, led by the attending physician.  Recommendations may be updated based on patient status, additional functional criteria and insurance authorization. Assessment / Plan / Recommendation Clinical Impressions 07/23/2021 Clinical Impression Patient presents with a mild oropharyngeal dysphagia as per this MBS. Epiglottis appeared rigid but adequately moved to protect airway during swallows. Consistencies tested included puree solids, thin liquids, regular solids and barium tablet. No instances of penetration or aspiration observed with any tested consistency. He exhibited a mild delay in mastication with regular solids and had difficulty with bolus cohesion with thin liquid and barium tablet. With thin liquids, swallow initiation delayed at level of vallecular sinus, but full clearance of bolus through pharynx and upper esophagus. Puree solids, regular solids and barium tablet all transited through pharynx and upper esophagus without difficulty. Prominent cricopharyngeal bar observed but did not impede bolus transit.  Patient and wife both educated on results of MBS after. SLP Visit Diagnosis Dysphagia, oropharyngeal phase (R13.12) Attention and concentration deficit following -- Frontal lobe and executive function deficit following -- Impact on safety and function No limitations;Mild aspiration risk   Treatment Recommendations 07/23/2021 Treatment Recommendations No treatment recommended at this time   No flowsheet data found. Diet Recommendations 07/23/2021 SLP Diet Recommendations Regular solids;Thin liquid Liquid Administration via Cup;Straw Medication Administration Whole meds with liquid Compensations -- Postural Changes --   Other Recommendations 07/23/2021 Recommended Consults -- Oral Care Recommendations -- Other Recommendations -- Follow Up Recommendations No SLP follow up Assistance recommended at discharge -- Functional Status Assessment -- No flowsheet data found.  Oral Phase 07/23/2021 Oral Phase Impaired Oral - Pudding Teaspoon -- Oral - Pudding Cup -- Oral - Honey Teaspoon -- Oral - Honey Cup -- Oral - Nectar Teaspoon -- Oral - Nectar Cup -- Oral - Nectar Straw -- Oral - Thin Teaspoon -- Oral - Thin Cup WFL Oral - Thin Straw WFL Oral - Puree WFL Oral - Mech Soft -- Oral - Regular Impaired mastication Oral - Multi-Consistency -- Oral - Pill Decreased bolus cohesion;Delayed oral transit Oral Phase - Comment --  Pharyngeal Phase 07/23/2021 Pharyngeal Phase Impaired Pharyngeal- Pudding Teaspoon -- Pharyngeal -- Pharyngeal- Pudding Cup -- Pharyngeal -- Pharyngeal- Honey Teaspoon -- Pharyngeal -- Pharyngeal- Honey Cup -- Pharyngeal -- Pharyngeal- Nectar Teaspoon -- Pharyngeal -- Pharyngeal- Nectar Cup -- Pharyngeal -- Pharyngeal- Nectar Straw -- Pharyngeal -- Pharyngeal- Thin Teaspoon -- Pharyngeal -- Pharyngeal- Thin Cup Delayed swallow initiation-vallecula Pharyngeal -- Pharyngeal- Thin Straw Delayed swallow  initiation-vallecula Pharyngeal -- Pharyngeal- Puree WFL Pharyngeal -- Pharyngeal- Mechanical Soft -- Pharyngeal --  Pharyngeal- Regular WFL Pharyngeal -- Pharyngeal- Multi-consistency -- Pharyngeal -- Pharyngeal- Pill Delayed swallow initiation-pyriform sinuses Pharyngeal -- Pharyngeal Comment --  Cervical Esophageal Phase  07/23/2021 Cervical Esophageal Phase WFL Pudding Teaspoon -- Pudding Cup -- Honey Teaspoon -- Honey Cup -- Nectar Teaspoon -- Nectar Cup -- Nectar Straw -- Thin Teaspoon -- Thin Cup -- Thin Straw -- Puree -- Mechanical Soft -- Regular -- Multi-consistency -- Pill -- Cervical Esophageal Comment -- Sonia Baller, MA, CCC-SLP Speech Therapy       CLINICAL DATA:  Dysphagia, unspecified type. Additional history provided: Dysphagia with solid foods, worse over the last 2 weeks. EXAM: MODIFIED BARIUM SWALLOW TECHNIQUE: Different consistencies of barium were administered orally to the patient by the Speech Pathologist. Imaging of the pharynx was performed in the lateral projection. Candiss Norse, PA was present in the fluoroscopy room for this study, providing personal supervision. FLUOROSCOPY: Fluoroscopy Time:  48 seconds Radiation Exposure Index (if provided by the fluoroscopic device): 4.80 mGy COMPARISON:  None FINDINGS: Different consistencies of barium were administered orally to the patient by the Speech Pathologist with fluoroscopic imaging of the pharynx from a lateral projection. Candiss Norse, PA was present in the fluoroscopy room for this study, providing personal supervision. The Speech Pathologist did not observe aspiration. Please refer to the Speech Pathologist's report for full details. IMPRESSION: Modified barium swallow, as described. No aspiration was observed. Please refer to the Speech Pathologist's report for complete details and recommendations. Electronically Signed   By: Kellie Simmering D.O.   On: 07/23/2021 12:06   DG Chest Portable 1 View  Result Date: 08/15/2021 CLINICAL DATA:  Fall injury. EXAM: PORTABLE CHEST 1 VIEW COMPARISON:  Portable chest 07/09/2021 FINDINGS: There is  mild cardiomegaly. No evidence of CHF or pleural collections. Aortic atherosclerosis with stable mediastinum. The lungs are clear. Osteopenia. Thoracic spondylosis. There is overlying monitor wiring. IMPRESSION: 1. No evidence of acute chest process. Stable chest with mild cardiomegaly. 2. Aortic atherosclerosis. Electronically Signed   By: Telford Nab M.D.   On: 08/15/2021 21:24   DG Hip Unilat W or Wo Pelvis 2-3 Views Left  Result Date: 08/15/2021 CLINICAL DATA:  fall EXAM: DG HIP (WITH OR WITHOUT PELVIS) 2-3V LEFT.  Patient is rotated. COMPARISON:  None. FINDINGS: Query cortical lucency and irregularity of the neck and greater trochanter of the left hip. There is no evidence of definite acute displaced fracture or dislocation of the left hip. Frontal view of the right hip grossly unremarkable. No definite acute displaced fracture of the pelvis. Unable to evaluate for pelvic diastasis due to patient rotation. There is no evidence of severe arthropathy or other focal bone abnormality. Vascular calcifications. IMPRESSION: 1. No definite acute displaced fracture or dislocation of the left hip. 2. Query cortical lucency and irregularity of the neck and greater trochanter of the left hip. Consider CT for further evaluation if clinically indicated. Electronically Signed   By: Iven Finn M.D.   On: 08/15/2021 21:26    Labs: Recent Labs    08/15/21 2045 08/16/21 0240  WBC 8.1 11.7*  RBC 4.75 4.30  HCT 43.9 40.5  PLT 206 192   Recent Labs    08/15/21 2045  NA 139  K 3.9  CL 104  CO2 25  BUN 11  CREATININE 1.05  GLUCOSE 107*  CALCIUM 8.9   Recent Labs    08/15/21 2045 08/16/21 0240  INR 2.2*  2.3*    Review of Systems: ROS detailed in HPI  Physical Exam: Body mass index is 24.22 kg/m.  Gen: AAOx3, NAD Comfortable at rest  Left Lower Extremity: Skin intact TTP over left hip, pain with log roll ADF/APF/EHL intact (motor exam limited by pain) SILT throughout DP, PT 2+ to  palp CR < 2s    Assessment and Plan: 34 yr M consult to Ortho for left displaced femoral neck fracture in the setting of therapeutic anticoagulation and several comorbidities  -reviewed history, exam and imaging at length with patient and his wife -he would benefit from left hemi vs total hip arthroplasty  -he will require medical optimization and clearance from his primary team -goal INR <2 prior to surgery; today's INR is 2.3; patient is being transitioned to heparin infusion per notes -will coordinate with OR control desk and primary team to establish best timing for surgery -in the meantime, NWB LLE and minimize log roll  Armond Hang, MD Orthopaedic Surgeon [facility] [phone]

## 2021-08-16 NOTE — ED Notes (Signed)
Admit provider at bedside 

## 2021-08-16 NOTE — Progress Notes (Signed)
ANTICOAGULATION CONSULT NOTE - Initial Consult ? ?Pharmacy Consult for Heparin (warfarin on hold) ?Indication: atrial fibrillation ? ?Allergies  ?Allergen Reactions  ? Bactrim [Sulfamethoxazole-Trimethoprim] Anaphylaxis  ?  Ulcers  ? Ciprofloxacin Swelling  ?  Tongue  ? Crestor [Rosuvastatin Calcium]   ?  Severe Myalgias  ? ?Patient Measurements: ?Height: 6' (182.9 cm) ?Weight: 81 kg (178 lb 9.2 oz) ?IBW/kg (Calculated) : 77.6 ? ?Vital Signs: ?Temp: 98.4 ?F (36.9 ?C) (03/10 2045) ?Temp Source: Temporal (03/10 2040) ?BP: 135/85 (03/11 0130) ?Pulse Rate: 64 (03/11 0130) ? ?Labs: ?Recent Labs  ?  08/15/21 ?2045  ?HGB 15.0  ?HCT 43.9  ?PLT 206  ?LABPROT 24.2*  ?INR 2.2*  ?CREATININE 1.05  ? ? ?Estimated Creatinine Clearance: 66.7 mL/min (by C-G formula based on SCr of 1.05 mg/dL). ? ? ?Medical History: ?Past Medical History:  ?Diagnosis Date  ? BPH (benign prostatic hyperplasia)   ? COPD (chronic obstructive pulmonary disease) ([place]) 2021  ? Coronary artery disease   ? a. s/p PCI to LCx in 2007; b. MV 2017 no sig ischemia, EF 59%, nl study. c. Cath 01/2020 without significant recurrent dz.  ? Depression   ? GERD (gastroesophageal reflux disease)   ? Hyperlipidemia   ? Hypertension   ? IBS (irritable bowel syndrome)   ? Myocardial infarction [facility])   ? 12-13 yrs ago   ? PAF (paroxysmal atrial fibrillation) ([place])   ? a. CHADS2VASc => 5 (HTN, age x 1, stroke x 2, vascular disease); b. on Coumadin   ? PNA (pneumonia)   ? Skin cancer   ? face   ? Sleep apnea   ? off cpap   ? Stroke [facility])   ? a. x 2 with residual left-sided weakness  ? ? ? ?Assessment: ?76 y/o M on warfarin PTA for afib. Presents to the ED with fall resulting in femoral neck fracture. Holding warfarin and starting heparin with possible orthopedic intervention upcoming. INR 2.2.  ? ?Goal of Therapy:  ?Heparin level 0.3-0.7 units/ml ?Monitor platelets by anticoagulation protocol: Yes ?  ?Plan:  ?Hold warfarin  ?Daily PT/INR ?Start heparin drip when INR is  <2 ? ?Narda Bonds, PharmD, BCPS ?Clinical Pharmacist ?Phone: [phone] ? ? ? ?

## 2021-08-16 NOTE — Progress Notes (Signed)
OT Cancellation Note ? ?Patient Details ?Name: [name] ?MRN: [id number] ?DOB: 1945-12-01 ? ? ?Cancelled Treatment:    Reason Eval/Treat Not Completed: Patient not medically ready- pt with hip fracture pending surgery and ortho consult.  OT will hold.  ? ?Jolaine Artist, OT ?Acute Rehabilitation Services ?Pager [phone] ?Office [phone] ? ? ?Delight Stare ?08/16/2021, 10:35 AM ?

## 2021-08-16 NOTE — Progress Notes (Signed)
PT Cancellation Note ? ?Patient Details ?Name: [name] ?MRN: [id number] ?DOB: [birth year] ? ? ?Cancelled Treatment:    Reason Eval/Treat Not Completed: Patient not medically ready (pending potential sx for left femoral neck fx) ? ?Wyona Almas, PT, DPT ?Acute Rehabilitation Services ?Pager [phone] ?Office [phone] ? ? ? ?Carloine Margo Aye ?08/16/2021, 9:48 AM ? ? ?

## 2021-08-17 ENCOUNTER — Encounter (HOSPITAL_COMMUNITY): Payer: Self-pay | Admitting: Student

## 2021-08-17 DIAGNOSIS — S72002A Fracture of unspecified part of neck of left femur, initial encounter for closed fracture: Secondary | ICD-10-CM | POA: Diagnosis not present

## 2021-08-17 LAB — CBC
HCT: 39.8 % (ref 39.0–52.0)
Hemoglobin: 13.4 g/dL (ref 13.0–17.0)
MCH: 31.2 pg (ref 26.0–34.0)
MCHC: 33.7 g/dL (ref 30.0–36.0)
MCV: 92.8 fL (ref 80.0–100.0)
Platelets: 173 10*3/uL (ref 150–400)
RBC: 4.29 MIL/uL (ref 4.22–5.81)
RDW: 13.8 % (ref 11.5–15.5)
WBC: 9.6 10*3/uL (ref 4.0–10.5)
nRBC: 0 % (ref 0.0–0.2)

## 2021-08-17 LAB — BASIC METABOLIC PANEL
Anion gap: 9 (ref 5–15)
BUN: 19 mg/dL (ref 8–23)
CO2: 25 mmol/L (ref 22–32)
Calcium: 8.8 mg/dL — ABNORMAL LOW (ref 8.9–10.3)
Chloride: 103 mmol/L (ref 98–111)
Creatinine, Ser: 1.31 mg/dL — ABNORMAL HIGH (ref 0.61–1.24)
GFR, Estimated: 57 mL/min — ABNORMAL LOW (ref >60)
Glucose, Bld: 131 mg/dL — ABNORMAL HIGH (ref 70–99)
Potassium: 4.2 mmol/L (ref 3.5–5.1)
Sodium: 137 mmol/L (ref 135–145)

## 2021-08-17 LAB — PROTIME-INR
INR: 2.2 — ABNORMAL HIGH (ref 0.8–1.2)
Prothrombin Time: 24.6 seconds — ABNORMAL HIGH (ref 11.4–15.2)

## 2021-08-17 MED ORDER — SODIUM CHLORIDE 0.9 % IV SOLN: INTRAVENOUS | Status: DC

## 2021-08-17 MED ORDER — ACETAMINOPHEN 325 MG PO TABS
650.0000 mg | ORAL_TABLET | Freq: Four times a day (QID) | ORAL | Status: DC
  Administered 2021-08-17 – 2021-08-18 (×4): 650 mg via ORAL
  Filled 2021-08-17 (×4): qty 2

## 2021-08-17 NOTE — Hospital Course (Addendum)
[name] is a 76 y.o. male presenting with recurrent falls and consequent left femoral fracture . PMH is significant for progressive supranuclear palsy (PSP), paroxysmal A-fib on Coumadin, CAD s/p stent (2017), MI (2007), CVA x2, COPD, GERD, HTN, HLD, BPH.   Left femoral neck fracture Pt presented to the ED hemodynamically stable after fall in which he hit his head due to residual left-sided weakness from prior stroke and worsening balance due to progressive supranuclear palsy (PSP). Head CT was normal and L hip x ray showed irregularity of the neck and greater trochanter of the L hip. CT pelvis showed mildly comminuted and mildly angulated fracture of the left femoral neck. Orthopedic surgery consulted and performed total hip arthroplasty on 3/13.   Progressive Supranuclear Palsy Pt uses a walker, has recent increase in falls. Palliative care was consulted and was able to early advanced care planning as well as additional DME such as lift equipment.   PAFib  Home medications (Amiodarone, bisoprolol and bASA) held in setting of likely surgery.

## 2021-08-17 NOTE — Progress Notes (Signed)
FPTS Interim Night Progress Note ? ?S:Patient sleeping comfortably.  Rounded with primary night RN. Received pain medication x 1 overnight  with good relief.  No concerns voiced.  No orders required.   ? ?O: ?Today's Vitals  ? 08/17/21 1522 08/17/21 2031 08/17/21 2133 08/17/21 2203  ?BP:  135/70    ?Pulse:  69    ?Resp:  14    ?Temp:  99.1 ?F (37.3 ?C)    ?TempSrc:      ?SpO2:  94%    ?Weight:      ?Height:      ?PainSc: 9   9  Asleep  ? ? ? ? ?A/P: ?Continue current management ?NPO > MN for potential surgery in am pending INR results.  Will start diet if no plan for intervention in am. ? ?Carollee Leitz MD ?PGY-3, [facility] ?Service pager [phone]   ?

## 2021-08-17 NOTE — Progress Notes (Signed)
OT Cancellation Note ? ?Patient Details ?Name: [name] ?MRN: [id number] ?DOB: 05-23-1946 ? ? ?Cancelled Treatment:    Reason Eval/Treat Not Completed: Patient not medically ready (awaiting surgery for hip fx, will follow, OT will evaluate when appropriate.) ? ?Lynnda Child, OTD, OTR/L ?Acute Rehab ?[phone] ? ?[name] ?08/17/2021, 1:45 PM ?

## 2021-08-17 NOTE — Progress Notes (Signed)
Family Medicine Teaching Service ?Daily Progress Note ?Intern Pager: [phone] ? ?Patient name: [name] Medical record number: [id number] ?Date of birth: 1946/01/08 Age: 76 y.o. Gender: male ? ?Primary Care Provider: [name], MD ?Consultants: Ortho ?Code Status: DNR ? ?Pt Overview and Major Events to Date:  ?08/16/2021-admitted for left femoral fracture ? ?Assessment and Plan: ?[name] is a 76 year old male presenting with recurrent falls and consequent left femoral fracture.  Past medical history significant for progressive supranuclear palsy, paroxysmal A-fib on Coumadin, CAD s/p stent (2017), MI (2007), CVA x2, COPD, GERD, hypertension, hyperlipidemia, BPH. ? ?Left femoral neck fracture- stable ?Patient's pain is controlled.  Ortho consulted, appreciate recommendations.  Their plan is to take him for repair when INR <2, see a fib problem below. INR today still elevated at 2.2. Patient was NPO at [place]. If unable to have surgery today, will give diet and continue bridge. BMP with mildly elevated s cr. Baseline appears to be 1.0-1.2, currently 1.3. Continue to monitor. ?-Orthopedics consulted, appreciate recs ?-non weight bearing LLE, limit log rolls ?-Hold coumadin, continue heparin bridge ?-PT/OT eval and treat ?-Tylenol q6h prn ?-Morphine 51m q4h prn ?-AM CBC, BMP, PT/INR ? ?Progressive supranuclear palsy- chronic ?Home medications include mirapex prn, zanaflex 4 mg prn for leg spasms. Palliative care met with patient, GOC clarified. Wife plans to care for patient at home. Confirmed DNR as well as plan to fix left femoral fracture.  ?-Hold medications while NPO ?-Palliative care outpatient ? ?Paroxysmal a fib on coumadin- chronic stable ?HR controlled 60s. Home medications include amiodarone 200 mg daily and bisoprolol 5 mg daily.  He is also on warfarin, aspirin 81 mg. ?-Hold warfarin, bridged with heparin for upcoming procedure ?- Continue amiodarone, bisoprolol on day of surgery ? ?CVA x2 w/  residual left-sided weakness  HLD-chronic stable ?Home medications include aspirin 81 mg, pravastatin, Praluent 75 mg q. 14 days ?-Hold home medications in setting of n.p.o. ? ?Constipation- chronic, stable ?Home medications include senna and Metamucil.  Will recommend repeat starting these after surgery. ? ?GERD- chronic, stable ?Home medications include omeprazole 40 mg twice daily ? ?HTN- chronic, stable ?Normotensive today Bps 120-130s/70s-80s. Home medications include amlodipine 2.5 mg. ?- Hold home medication while NPO. ?- Continue to monitor ? ?FEN/GI: N.p.o. ?PPx: Heparin ?Dispo:Home with home health  in 2-3 days. Barriers include surgery and medical stabilization.  ? ?Subjective:  ?Patient having some moderate pain, otherwise no complaints. Lawst medication 8 PM last night ? ?Objective: ?Temp:  [99.8 ?F (37.7 ?C)-100.4 ?F (38 ?C)] 99.8 ?F (37.7 ?C) (03/11 1700) ?Pulse Rate:  [57-64] 64 (03/11 1422) ?Resp:  [8-20] 16 (03/11 1422) ?BP: (104-136)/(72-86) 133/86 (03/11 1422) ?SpO2:  [92 %-95 %] 93 % (03/11 1422) ?Physical Exam: ?General: age appropriate WM, resting in bed, NAD, WNWD ?Cardiovascular: RRR, no m/r/g, 2+ radial pulses and DP ?Respiratory: CTAB, no iWOB, w/r/r ?Abdomen: soft, NT, ND ?Extremities: left leg shortened, externally rotated, no edema, warm/dry to touch ? ?Laboratory: ?Recent Labs  ?Lab 08/15/21 ?2045 08/16/21 ?0240  ?WBC 8.1 11.7*  ?HGB 15.0 13.5  ?HCT 43.9 40.5  ?PLT 206 192  ? ?Recent Labs  ?Lab 08/15/21 ?2045  ?NA 139  ?K 3.9  ?CL 104  ?CO2 25  ?BUN 11  ?CREATININE 1.05  ?CALCIUM 8.9  ?PROT 6.8  ?BILITOT 0.4  ?ALKPHOS 114  ?ALT 55*  ?AST 45*  ?GLUCOSE 107*  ? ?Imaging/Diagnostic Tests: ?CT Head Wo Contrast ? ?Result Date: 08/15/2021 ?CLINICAL DATA:  Head trauma. EXAM: CT HEAD WITHOUT  CONTRAST TECHNIQUE: Contiguous axial images were obtained from the base of the skull through the vertex without intravenous contrast. RADIATION DOSE REDUCTION: This exam was performed according to the  departmental dose-optimization program which includes automated exposure control, adjustment of the mA and/or kV according to patient size and/or use of iterative reconstruction technique. COMPARISON:  Head CT dated 07/09/2021. FINDINGS: Brain: Moderate age-related atrophy and chronic microvascular ischemic changes. There is no acute intracranial hemorrhage. No mass effect or midline shift. No extra-axial fluid collection. Vascular: No hyperdense vessel or unexpected calcification. Skull: Normal. Negative for fracture or focal lesion. Sinuses/Orbits: No acute finding. Other: None IMPRESSION: 1. No acute intracranial pathology. 2. Moderate age-related atrophy and chronic microvascular ischemic changes. Electronically Signed   By: Anner Crete M.D.   On: 08/15/2021 21:31  ? ?CT PELVIS WO CONTRAST ? ?Result Date: 08/15/2021 ?CLINICAL DATA:  Trauma. EXAM: CT PELVIS WITHOUT CONTRAST TECHNIQUE: Multidetector CT imaging of the pelvis was performed following the standard protocol without intravenous contrast. RADIATION DOSE REDUCTION: This exam was performed according to the departmental dose-optimization program which includes automated exposure control, adjustment of the mA and/or kV according to patient size and/or use of iterative reconstruction technique. COMPARISON:  CT of the chest abdomen pelvis dated 01/13/2021. FINDINGS: Evaluation of this exam is limited in the absence of intravenous contrast. Urinary Tract:  No abnormality visualized. Bowel:  Unremarkable visualized pelvic bowel loops. Vascular/Lymphatic: There is atherosclerotic calcification of the abdominal aorta and iliac arteries. No pelvic adenopathy. Reproductive: The prostate and seminal vesicles are grossly unremarkable. Other:  None Musculoskeletal: The bones are osteopenic. There is a somewhat comminuted and mildly angulated fracture of the left femoral neck extending from the subcapital to the intertrochanteric region. There is mild there is  angulation. No dislocation. IMPRESSION: Mildly comminuted and mildly angulated fracture of the left femoral neck. Aortic Atherosclerosis (ICD10-I70.0). Electronically Signed   By: Anner Crete M.D.   On: 08/15/2021 22:22  ? ?DG Chest Portable 1 View ? ?Result Date: 08/15/2021 ?CLINICAL DATA:  Fall injury. EXAM: PORTABLE CHEST 1 VIEW COMPARISON:  Portable chest 07/09/2021 FINDINGS: There is mild cardiomegaly. No evidence of CHF or pleural collections. Aortic atherosclerosis with stable mediastinum. The lungs are clear. Osteopenia. Thoracic spondylosis. There is overlying monitor wiring. IMPRESSION: 1. No evidence of acute chest process. Stable chest with mild cardiomegaly. 2. Aortic atherosclerosis. Electronically Signed   By: Telford Nab M.D.   On: 08/15/2021 21:24  ? ?DG Hip Unilat W or Wo Pelvis 2-3 Views Left ? ?Result Date: 08/15/2021 ?CLINICAL DATA:  fall EXAM: DG HIP (WITH OR WITHOUT PELVIS) 2-3V LEFT.  Patient is rotated. COMPARISON:  None. FINDINGS: Query cortical lucency and irregularity of the neck and greater trochanter of the left hip. There is no evidence of definite acute displaced fracture or dislocation of the left hip. Frontal view of the right hip grossly unremarkable. No definite acute displaced fracture of the pelvis. Unable to evaluate for pelvic diastasis due to patient rotation. There is no evidence of severe arthropathy or other focal bone abnormality. Vascular calcifications. IMPRESSION: 1. No definite acute displaced fracture or dislocation of the left hip. 2. Query cortical lucency and irregularity of the neck and greater trochanter of the left hip. Consider CT for further evaluation if clinically indicated. Electronically Signed   By: Iven Finn M.D.   On: 08/15/2021 21:26   ? ?Gladys Damme, MD ?08/17/2021, 3:13 AM ?PGY-3, [facility] ?[place] Intern pager: [phone], text pages welcome ? ?

## 2021-08-17 NOTE — Progress Notes (Signed)
Subjective: ?   ? ?Patient reports pain as mild.  ? ?Objective:  ? ?VITALS:  Temp:  [97.9 ?F (36.6 ?C)-99.4 ?F (37.4 ?C)] 97.9 ?F (36.6 ?C) (03/12 1339) ?Pulse Rate:  [64-72] 64 (03/12 1339) ?Resp:  [16] 16 (03/12 0752) ?BP: (136-148)/(77-109) 136/77 (03/12 1339) ?SpO2:  [93 %] 93 % (03/12 1339) ? ?Gen: AAOx3, NAD ?Comfortable at rest ?  ?Left Lower Extremity: ?Skin intact ?TTP over left hip, pain with log roll ?ADF/APF/EHL intact (motor exam limited by pain) ?SILT throughout ?DP, PT 2+ to palp ?CR < 2s ? ? ?LABS ?Recent Labs  ?  08/15/21 ?2045 08/16/21 ?0240 08/17/21 ?0050  ?HGB 15.0 13.5 13.4  ?WBC 8.1 11.7* 9.6  ?PLT 206 192 173  ? ?Recent Labs  ?  08/15/21 ?2045 08/17/21 ?0050  ?NA 139 137  ?K 3.9 4.2  ?CL 104 103  ?CO2 25 25  ?BUN 11 19  ?CREATININE 1.05 1.31*  ?GLUCOSE 107* 131*  ? ?Recent Labs  ?  08/16/21 ?0240 08/17/21 ?0050  ?INR 2.3* 2.2*  ? ? ? ?Assessment/Plan: ?85 yr M consult to Ortho for left displaced femoral neck fracture in the setting of therapeutic anticoagulation and several comorbidities ?  ?-reviewed history, exam and imaging at length with patient and his wife ?-he would benefit from left hemi vs total hip arthroplasty  ?-he will require medical optimization and clearance from his primary team ?-goal INR <2 prior to surgery; today's INR is 2.2; patient is being transitioned to heparin infusion per notes ?-will coordinate with OR control desk and primary team to establish surgery timing ? ?Armond Hang ?08/17/2021, 8:22 PM ? ?

## 2021-08-17 NOTE — Progress Notes (Signed)
PT Cancellation Note ? ?Patient Details ?Name: [name] ?MRN: [id number] ?DOB: [birth year] ? ? ?Cancelled Treatment:    Reason Eval/Treat Not Completed: Medical issues which prohibited therapy. Pt awaiting surgery for hip fx. PT will hold until surgery is complete. ? ? ?Zenaida Niece ?08/17/2021, 1:36 PM ?

## 2021-08-17 NOTE — Progress Notes (Signed)
Upon wife's arrival to visit today about 11 am she called this nurse to the room.  She stated "he's in pain and I can tell he doesn't have enough Morphine because he's lucid".  Patient lying in bed appearing comfortable and had reported this morning during his assessment (3557) that his pain was a 4.  He had received pain meds about 0630.  Explained to wife that the goal was to control his pain not alter his mental status or have him less lucid. She also reported he was having spasm/ tremor like movements of his R leg (not the affected leg).  These movements had not been observed during assessment and after standing in the room did observe the tremor she was referring to of the R leg.  Family Medicine resident informed and reported they would come visit the patient.  ?While this nurse was on break, another nurse medicated for pain at 1522 per wife's request.  This nurse went to the room to check on patient and wife very upset that patient had to wait so long for pain meds.   Informed her it was just over the order parameters of 4 hours. She reports he is too much pain to wait that long and  needs pain medication more frequently.  Paged Family med resident to inform them.  Waiting for response.  ?

## 2021-08-17 NOTE — Progress Notes (Signed)
FPTS Interim Progress Note ? ?S:Patient watching TV. Has no concerns at this time.  ? ?O: ?BP 133/86 (BP Location: Right Arm)   Pulse 64   Temp 99.8 ?F (37.7 ?C) (Oral)   Resp 16   Ht 6' (1.829 m)   Wt 81 kg   SpO2 93%   BMI 24.22 kg/m?   ? ?GEN: alert, resting in bed with TV  ?RESP: equal chest rise and fall ? ? ?A/P: ?No changes to current plan. See daily progress note.  ?- Orders reviewed. Labs for AM ordered, which was adjusted as needed.  ? Lyndee Hensen, DO ?08/17/2021, 1:00 AM ?PGY-3, [facility] Night Resident  ?Please page [phone] with questions.  ? ?

## 2021-08-18 ENCOUNTER — Inpatient Hospital Stay (HOSPITAL_COMMUNITY): Payer: PPO

## 2021-08-18 ENCOUNTER — Encounter (HOSPITAL_COMMUNITY)
Admission: EM | Disposition: A | Discharge status: Skilled Nursing Facility | Payer: Self-pay | Source: Home / Self Care | Attending: Family Medicine

## 2021-08-18 ENCOUNTER — Inpatient Hospital Stay (HOSPITAL_COMMUNITY): Payer: PPO | Admitting: Anesthesiology

## 2021-08-18 ENCOUNTER — Other Ambulatory Visit: Payer: Self-pay

## 2021-08-18 ENCOUNTER — Encounter (HOSPITAL_COMMUNITY): Payer: Self-pay | Admitting: Student

## 2021-08-18 DIAGNOSIS — I10 Essential (primary) hypertension: Secondary | ICD-10-CM

## 2021-08-18 DIAGNOSIS — I2510 Atherosclerotic heart disease of native coronary artery without angina pectoris: Secondary | ICD-10-CM

## 2021-08-18 DIAGNOSIS — S72002A Fracture of unspecified part of neck of left femur, initial encounter for closed fracture: Secondary | ICD-10-CM

## 2021-08-18 DIAGNOSIS — I252 Old myocardial infarction: Secondary | ICD-10-CM

## 2021-08-18 DIAGNOSIS — Z87891 Personal history of nicotine dependence: Secondary | ICD-10-CM

## 2021-08-18 LAB — BASIC METABOLIC PANEL
Anion gap: 6 (ref 5–15)
BUN: 15 mg/dL (ref 8–23)
CO2: 29 mmol/L (ref 22–32)
Calcium: 8.8 mg/dL — ABNORMAL LOW (ref 8.9–10.3)
Chloride: 103 mmol/L (ref 98–111)
Creatinine, Ser: 1.08 mg/dL (ref 0.61–1.24)
GFR, Estimated: >60 mL/min (ref >60)
Glucose, Bld: 131 mg/dL — ABNORMAL HIGH (ref 70–99)
Potassium: 4.2 mmol/L (ref 3.5–5.1)
Sodium: 138 mmol/L (ref 135–145)

## 2021-08-18 LAB — CBC
HCT: 39.2 % (ref 39.0–52.0)
Hemoglobin: 12.9 g/dL — ABNORMAL LOW (ref 13.0–17.0)
MCH: 30.6 pg (ref 26.0–34.0)
MCHC: 32.9 g/dL (ref 30.0–36.0)
MCV: 93.1 fL (ref 80.0–100.0)
Platelets: 156 10*3/uL (ref 150–400)
RBC: 4.21 MIL/uL — ABNORMAL LOW (ref 4.22–5.81)
RDW: 13.4 % (ref 11.5–15.5)
WBC: 8.8 10*3/uL (ref 4.0–10.5)
nRBC: 0 % (ref 0.0–0.2)

## 2021-08-18 LAB — SURGICAL PCR SCREEN
MRSA, PCR: NEGATIVE
Staphylococcus aureus: NEGATIVE

## 2021-08-18 LAB — TYPE AND SCREEN
ABO/RH(D): AB POS
Antibody Screen: NEGATIVE

## 2021-08-18 LAB — PROTIME-INR
INR: 1.5 — ABNORMAL HIGH (ref 0.8–1.2)
Prothrombin Time: 18.5 seconds — ABNORMAL HIGH (ref 11.4–15.2)

## 2021-08-18 LAB — ABO/RH: ABO/RH(D): AB POS

## 2021-08-18 LAB — MAGNESIUM: Magnesium: 1.7 mg/dL (ref 1.7–2.4)

## 2021-08-18 SURGERY — ARTHROPLASTY, HIP, TOTAL, ANTERIOR APPROACH
Anesthesia: General | Site: Hip | Laterality: Left

## 2021-08-18 MED ORDER — PHENOL 1.4 % MT LIQD: 1.0000 | OROMUCOSAL | Status: DC | PRN

## 2021-08-18 MED ORDER — ACETAMINOPHEN 500 MG PO TABS
1000.0000 mg | ORAL_TABLET | Freq: Once | ORAL | Status: AC
  Administered 2021-08-18: 1000 mg via ORAL
  Filled 2021-08-18: qty 2

## 2021-08-18 MED ORDER — SENNA 8.6 MG PO TABS: 1.0000 | ORAL_TABLET | Freq: Every day | ORAL | Status: DC

## 2021-08-18 MED ORDER — EPHEDRINE 5 MG/ML INJ
INTRAVENOUS | Status: AC
  Filled 2021-08-18: qty 5

## 2021-08-18 MED ORDER — DOCUSATE SODIUM 100 MG PO CAPS
100.0000 mg | ORAL_CAPSULE | Freq: Two times a day (BID) | ORAL | Status: DC
  Administered 2021-08-18 – 2021-08-19 (×2): 100 mg via ORAL
  Filled 2021-08-18 (×2): qty 1

## 2021-08-18 MED ORDER — LIDOCAINE 2% (20 MG/ML) 5 ML SYRINGE
INTRAMUSCULAR | Status: AC
  Filled 2021-08-18: qty 5

## 2021-08-18 MED ORDER — FLEET ENEMA 7-19 GM/118ML RE ENEM: 1.0000 | ENEMA | Freq: Once | RECTAL | Status: DC | PRN

## 2021-08-18 MED ORDER — MORPHINE SULFATE (PF) 2 MG/ML IV SOLN
0.5000 mg | INTRAVENOUS | Status: DC | PRN
  Administered 2021-08-19: 1 mg via INTRAVENOUS
  Filled 2021-08-18: qty 1

## 2021-08-18 MED ORDER — ENOXAPARIN SODIUM 40 MG/0.4ML IJ SOSY
40.0000 mg | PREFILLED_SYRINGE | Freq: Once | INTRAMUSCULAR | Status: AC
  Administered 2021-08-19: 40 mg via SUBCUTANEOUS
  Filled 2021-08-18 (×2): qty 0.4

## 2021-08-18 MED ORDER — SODIUM CHLORIDE 0.9 % IR SOLN
Status: DC | PRN
  Administered 2021-08-18: 1000 mL

## 2021-08-18 MED ORDER — ROCURONIUM BROMIDE 10 MG/ML (PF) SYRINGE
PREFILLED_SYRINGE | INTRAVENOUS | Status: AC
  Filled 2021-08-18: qty 10

## 2021-08-18 MED ORDER — KETOROLAC TROMETHAMINE 30 MG/ML IJ SOLN
INTRAMUSCULAR | Status: AC
  Filled 2021-08-18: qty 1

## 2021-08-18 MED ORDER — FENTANYL CITRATE (PF) 100 MCG/2ML IJ SOLN: 25.0000 ug | INTRAMUSCULAR | Status: DC | PRN

## 2021-08-18 MED ORDER — POVIDONE-IODINE 10 % EX SWAB
2.0000 "application " | Freq: Once | CUTANEOUS | Status: AC
  Administered 2021-08-18: 2 via TOPICAL

## 2021-08-18 MED ORDER — CEFAZOLIN SODIUM-DEXTROSE 2-4 GM/100ML-% IV SOLN
2.0000 g | INTRAVENOUS | Status: AC
  Administered 2021-08-18: 2 g via INTRAVENOUS
  Filled 2021-08-18: qty 100

## 2021-08-18 MED ORDER — METHOCARBAMOL 500 MG PO TABS
500.0000 mg | ORAL_TABLET | Freq: Four times a day (QID) | ORAL | Status: DC | PRN
  Administered 2021-08-20: 500 mg via ORAL
  Filled 2021-08-18 (×2): qty 1

## 2021-08-18 MED ORDER — CITALOPRAM HYDROBROMIDE 20 MG PO TABS
20.0000 mg | ORAL_TABLET | Freq: Every day | ORAL | Status: DC
  Administered 2021-08-19 – 2021-08-28 (×10): 20 mg via ORAL
  Filled 2021-08-18 (×10): qty 1

## 2021-08-18 MED ORDER — HEPARIN BOLUS VIA INFUSION
2000.0000 [IU] | Freq: Once | INTRAVENOUS | Status: DC
  Filled 2021-08-18: qty 2000

## 2021-08-18 MED ORDER — HYDROCODONE-ACETAMINOPHEN 5-325 MG PO TABS
1.0000 | ORAL_TABLET | ORAL | Status: DC | PRN
  Administered 2021-08-19: 2 via ORAL
  Filled 2021-08-18: qty 1
  Filled 2021-08-18: qty 2

## 2021-08-18 MED ORDER — HYDROCODONE-ACETAMINOPHEN 7.5-325 MG PO TABS
1.0000 | ORAL_TABLET | ORAL | Status: DC | PRN
  Administered 2021-08-19: 2 via ORAL
  Administered 2021-08-20 (×2): 1 via ORAL
  Administered 2021-08-21 (×2): 2 via ORAL
  Administered 2021-08-21: 1 via ORAL
  Filled 2021-08-18 (×4): qty 2
  Filled 2021-08-18 (×2): qty 1

## 2021-08-18 MED ORDER — METHOCARBAMOL 1000 MG/10ML IJ SOLN
500.0000 mg | Freq: Four times a day (QID) | INTRAVENOUS | Status: DC | PRN
  Filled 2021-08-18: qty 5

## 2021-08-18 MED ORDER — WARFARIN SODIUM 3 MG PO TABS
3.0000 mg | ORAL_TABLET | Freq: Once | ORAL | Status: AC
  Administered 2021-08-18: 3 mg via ORAL
  Filled 2021-08-18: qty 1

## 2021-08-18 MED ORDER — PHENYLEPHRINE HCL-NACL 20-0.9 MG/250ML-% IV SOLN
INTRAVENOUS | Status: DC | PRN
  Administered 2021-08-18: 25 ug/min via INTRAVENOUS

## 2021-08-18 MED ORDER — EPHEDRINE SULFATE-NACL 50-0.9 MG/10ML-% IV SOSY
PREFILLED_SYRINGE | INTRAVENOUS | Status: DC | PRN
  Administered 2021-08-18 (×3): 5 mg via INTRAVENOUS

## 2021-08-18 MED ORDER — CEFAZOLIN SODIUM-DEXTROSE 2-4 GM/100ML-% IV SOLN
2.0000 g | Freq: Four times a day (QID) | INTRAVENOUS | Status: AC
  Administered 2021-08-18 – 2021-08-19 (×2): 2 g via INTRAVENOUS
  Filled 2021-08-18 (×2): qty 100

## 2021-08-18 MED ORDER — FENTANYL CITRATE (PF) 250 MCG/5ML IJ SOLN
INTRAMUSCULAR | Status: DC | PRN
  Administered 2021-08-18: 50 ug via INTRAVENOUS
  Administered 2021-08-18: 25 ug via INTRAVENOUS
  Administered 2021-08-18 (×2): 50 ug via INTRAVENOUS

## 2021-08-18 MED ORDER — CHLORHEXIDINE GLUCONATE 4 % EX LIQD
60.0000 mL | Freq: Once | CUTANEOUS | Status: DC
  Filled 2021-08-18: qty 60

## 2021-08-18 MED ORDER — SUGAMMADEX SODIUM 200 MG/2ML IV SOLN
INTRAVENOUS | Status: DC | PRN
  Administered 2021-08-18: 200 mg via INTRAVENOUS

## 2021-08-18 MED ORDER — LACTATED RINGERS IV SOLN: INTRAVENOUS | Status: DC

## 2021-08-18 MED ORDER — POLYETHYLENE GLYCOL 3350 17 G PO PACK: 17.0000 g | PACK | Freq: Two times a day (BID) | ORAL | Status: DC

## 2021-08-18 MED ORDER — CHLORHEXIDINE GLUCONATE 0.12 % MT SOLN: 15.0000 mL | Freq: Once | OROMUCOSAL | Status: AC

## 2021-08-18 MED ORDER — PROPOFOL 10 MG/ML IV BOLUS
INTRAVENOUS | Status: AC
  Filled 2021-08-18: qty 20

## 2021-08-18 MED ORDER — WARFARIN - PHARMACIST DOSING INPATIENT
Freq: Every day | Status: DC
Start: 2021-08-19 — End: 2021-08-23

## 2021-08-18 MED ORDER — 0.9 % SODIUM CHLORIDE (POUR BTL) OPTIME
TOPICAL | Status: DC | PRN
  Administered 2021-08-18: 1000 mL

## 2021-08-18 MED ORDER — POLYETHYLENE GLYCOL 3350 17 G PO PACK: 17.0000 g | PACK | Freq: Every day | ORAL | Status: DC | PRN

## 2021-08-18 MED ORDER — BUPIVACAINE HCL (PF) 0.5 % IJ SOLN
INTRAMUSCULAR | Status: AC
  Filled 2021-08-18: qty 30

## 2021-08-18 MED ORDER — FENTANYL CITRATE (PF) 250 MCG/5ML IJ SOLN
INTRAMUSCULAR | Status: AC
  Filled 2021-08-18: qty 5

## 2021-08-18 MED ORDER — SODIUM CHLORIDE 0.9 % IR SOLN
Status: DC | PRN
  Administered 2021-08-18: 3000 mL

## 2021-08-18 MED ORDER — ENOXAPARIN SODIUM 80 MG/0.8ML IJ SOSY
80.0000 mg | PREFILLED_SYRINGE | Freq: Two times a day (BID) | INTRAMUSCULAR | Status: DC
  Administered 2021-08-19: 80 mg via SUBCUTANEOUS
  Filled 2021-08-18 (×2): qty 0.8

## 2021-08-18 MED ORDER — DEXAMETHASONE SODIUM PHOSPHATE 10 MG/ML IJ SOLN
INTRAMUSCULAR | Status: DC | PRN
  Administered 2021-08-18: 10 mg via INTRAVENOUS

## 2021-08-18 MED ORDER — SORBITOL 70 % SOLN: 30.0000 mL | Freq: Every day | Status: DC | PRN

## 2021-08-18 MED ORDER — ROCURONIUM BROMIDE 10 MG/ML (PF) SYRINGE
PREFILLED_SYRINGE | INTRAVENOUS | Status: DC | PRN
  Administered 2021-08-18 (×2): 5 mg via INTRAVENOUS
  Administered 2021-08-18: 20 mg via INTRAVENOUS
  Administered 2021-08-18: 50 mg via INTRAVENOUS

## 2021-08-18 MED ORDER — PROPOFOL 10 MG/ML IV BOLUS
INTRAVENOUS | Status: DC | PRN
  Administered 2021-08-18: 40 mg via INTRAVENOUS
  Administered 2021-08-18: 100 mg via INTRAVENOUS

## 2021-08-18 MED ORDER — CITALOPRAM HYDROBROMIDE 20 MG PO TABS: 10.0000 mg | ORAL_TABLET | Freq: Every day | ORAL | Status: DC

## 2021-08-18 MED ORDER — ONDANSETRON HCL 4 MG/2ML IJ SOLN
INTRAMUSCULAR | Status: AC
  Filled 2021-08-18: qty 2

## 2021-08-18 MED ORDER — SODIUM CHLORIDE (PF) 0.9 % IJ SOLN
INTRAMUSCULAR | Status: DC | PRN
  Administered 2021-08-18: 30 mL

## 2021-08-18 MED ORDER — HEPARIN (PORCINE) 25000 UT/250ML-% IV SOLN: 1150.0000 [IU]/h | INTRAVENOUS | Status: DC

## 2021-08-18 MED ORDER — KETOROLAC TROMETHAMINE 30 MG/ML IJ SOLN
INTRAMUSCULAR | Status: DC | PRN
  Administered 2021-08-18: 30 mg via INTRA_ARTICULAR

## 2021-08-18 MED ORDER — SUCCINYLCHOLINE CHLORIDE 200 MG/10ML IV SOSY
PREFILLED_SYRINGE | INTRAVENOUS | Status: AC
  Filled 2021-08-18: qty 30

## 2021-08-18 MED ORDER — ACETAMINOPHEN 325 MG PO TABS
325.0000 mg | ORAL_TABLET | Freq: Four times a day (QID) | ORAL | Status: DC | PRN
  Administered 2021-08-19 – 2021-08-20 (×2): 650 mg via ORAL
  Filled 2021-08-18 (×2): qty 2

## 2021-08-18 MED ORDER — SENNA 8.6 MG PO TABS
1.0000 | ORAL_TABLET | Freq: Two times a day (BID) | ORAL | Status: DC
  Administered 2021-08-18 – 2021-08-19 (×2): 8.6 mg via ORAL
  Filled 2021-08-18 (×2): qty 1

## 2021-08-18 MED ORDER — ONDANSETRON HCL 4 MG/2ML IJ SOLN: 4.0000 mg | Freq: Four times a day (QID) | INTRAMUSCULAR | Status: DC | PRN

## 2021-08-18 MED ORDER — ONDANSETRON HCL 4 MG PO TABS: 4.0000 mg | ORAL_TABLET | Freq: Four times a day (QID) | ORAL | Status: DC | PRN

## 2021-08-18 MED ORDER — METOCLOPRAMIDE HCL 5 MG/ML IJ SOLN: 5.0000 mg | Freq: Three times a day (TID) | INTRAMUSCULAR | Status: DC | PRN

## 2021-08-18 MED ORDER — METOCLOPRAMIDE HCL 5 MG PO TABS: 5.0000 mg | ORAL_TABLET | Freq: Three times a day (TID) | ORAL | Status: DC | PRN

## 2021-08-18 MED ORDER — TRANEXAMIC ACID-NACL 1000-0.7 MG/100ML-% IV SOLN
1000.0000 mg | INTRAVENOUS | Status: AC
  Administered 2021-08-18: 1000 mg via INTRAVENOUS
  Filled 2021-08-18: qty 100

## 2021-08-18 MED ORDER — LIDOCAINE 2% (20 MG/ML) 5 ML SYRINGE
INTRAMUSCULAR | Status: DC | PRN
  Administered 2021-08-18: 60 mg via INTRAVENOUS

## 2021-08-18 MED ORDER — ONDANSETRON HCL 4 MG/2ML IJ SOLN
INTRAMUSCULAR | Status: DC | PRN
  Administered 2021-08-18: 4 mg via INTRAVENOUS

## 2021-08-18 MED ORDER — ORAL CARE MOUTH RINSE: 15.0000 mL | Freq: Once | OROMUCOSAL | Status: AC

## 2021-08-18 MED ORDER — CHLORHEXIDINE GLUCONATE 0.12 % MT SOLN
OROMUCOSAL | Status: AC
  Administered 2021-08-18: 15 mL via OROMUCOSAL
  Filled 2021-08-18: qty 15

## 2021-08-18 MED ORDER — DEXAMETHASONE SODIUM PHOSPHATE 10 MG/ML IJ SOLN
INTRAMUSCULAR | Status: AC
  Filled 2021-08-18: qty 1

## 2021-08-18 MED ORDER — BUPIVACAINE-EPINEPHRINE (PF) 0.5% -1:200000 IJ SOLN
INTRAMUSCULAR | Status: DC | PRN
  Administered 2021-08-18: 30 mL

## 2021-08-18 MED ORDER — MENTHOL 3 MG MT LOZG: 1.0000 | LOZENGE | OROMUCOSAL | Status: DC | PRN

## 2021-08-18 SURGICAL SUPPLY — 68 items
ADH SKN CLS APL DERMABOND .7 (GAUZE/BANDAGES/DRESSINGS) ×2
ALCOHOL 70% 16 OZ (MISCELLANEOUS) ×2 IMPLANT
APL PRP STRL LF DISP 70% ISPRP (MISCELLANEOUS) ×1
BAG COUNTER SPONGE SURGICOUNT (BAG) ×2 IMPLANT
BAG SPNG CNTER NS LX DISP (BAG) ×1
BLADE CLIPPER SURG (BLADE) ×1 IMPLANT
CABLE CERLAGE W/CRIMP 1.8 (Cable) ×1 IMPLANT
CHLORAPREP W/TINT 26 (MISCELLANEOUS) ×2 IMPLANT
COVER SURGICAL LIGHT HANDLE (MISCELLANEOUS) ×2 IMPLANT
DERMABOND ADVANCED (GAUZE/BANDAGES/DRESSINGS) ×2
DERMABOND ADVANCED .7 DNX12 (GAUZE/BANDAGES/DRESSINGS) ×2 IMPLANT
DRAPE C-ARM 42X72 X-RAY (DRAPES) ×2 IMPLANT
DRAPE STERI IOBAN 125X83 (DRAPES) ×2 IMPLANT
DRAPE U-SHAPE 47X51 STRL (DRAPES) ×6 IMPLANT
DRSG AQUACEL AG ADV 3.5X10 (GAUZE/BANDAGES/DRESSINGS) ×2 IMPLANT
ELECT BLADE 4.0 EZ CLEAN MEGAD (MISCELLANEOUS) ×2
ELECT PENCIL ROCKER SW 15FT (MISCELLANEOUS) ×2 IMPLANT
ELECT REM PT RETURN 9FT ADLT (ELECTROSURGICAL) ×2
ELECTRODE BLDE 4.0 EZ CLN MEGD (MISCELLANEOUS) ×1 IMPLANT
ELECTRODE REM PT RTRN 9FT ADLT (ELECTROSURGICAL) ×1 IMPLANT
EVACUATOR 1/8 PVC DRAIN (DRAIN) ×1 IMPLANT
GLOVE SURG ENC MOIS LTX SZ8.5 (GLOVE) ×4 IMPLANT
GLOVE SURG ENC TEXT LTX SZ7 (GLOVE) ×2 IMPLANT
GLOVE SURG UNDER POLY LF SZ7.5 (GLOVE) ×2 IMPLANT
GLOVE SURG UNDER POLY LF SZ8.5 (GLOVE) ×2 IMPLANT
GOWN STRL REUS W/ TWL LRG LVL3 (GOWN DISPOSABLE) ×2 IMPLANT
GOWN STRL REUS W/ TWL XL LVL3 (GOWN DISPOSABLE) ×1 IMPLANT
GOWN STRL REUS W/TWL 2XL LVL3 (GOWN DISPOSABLE) ×2 IMPLANT
GOWN STRL REUS W/TWL LRG LVL3 (GOWN DISPOSABLE) ×4
GOWN STRL REUS W/TWL XL LVL3 (GOWN DISPOSABLE) ×2
HANDPIECE INTERPULSE COAX TIP (DISPOSABLE) ×2
HEAD CERAMIC BIOLOX 36 (Head) ×1 IMPLANT
HOOD PEEL AWAY FACE SHEILD DIS (HOOD) ×4 IMPLANT
JET LAVAGE IRRISEPT WOUND (IRRIGATION / IRRIGATOR) ×4
KIT BASIN OR (CUSTOM PROCEDURE TRAY) ×2 IMPLANT
KIT TURNOVER KIT B (KITS) ×2 IMPLANT
LAVAGE JET IRRISEPT WOUND (IRRIGATION / IRRIGATOR) ×1 IMPLANT
LINER ACETAB NN G7 F 36 (Liner) ×1 IMPLANT
MANIFOLD NEPTUNE II (INSTRUMENTS) ×2 IMPLANT
MARKER SKIN DUAL TIP RULER LAB (MISCELLANEOUS) ×4 IMPLANT
NDL SPNL 18GX3.5 QUINCKE PK (NEEDLE) ×1 IMPLANT
NEEDLE SPNL 18GX3.5 QUINCKE PK (NEEDLE) ×2 IMPLANT
NS IRRIG 1000ML POUR BTL (IV SOLUTION) ×2 IMPLANT
PACK TOTAL JOINT (CUSTOM PROCEDURE TRAY) ×2 IMPLANT
PACK UNIVERSAL I (CUSTOM PROCEDURE TRAY) ×2 IMPLANT
PAD ARMBOARD 7.5X6 YLW CONV (MISCELLANEOUS) ×4 IMPLANT
SAW OSC TIP CART 19.5X105X1.3 (SAW) ×2 IMPLANT
SEALER BIPOLAR AQUA 6.0 (INSTRUMENTS) ×1 IMPLANT
SET HNDPC FAN SPRY TIP SCT (DISPOSABLE) ×1 IMPLANT
SHELL ACET G7 4H 56 SZF (Shell) ×1 IMPLANT
SOL PREP POV-IOD 4OZ 10% (MISCELLANEOUS) ×2 IMPLANT
STAPLER VISISTAT 35W (STAPLE) ×2 IMPLANT
STEM FEM HIOFF ARCOS 16X175 (Stem) ×1 IMPLANT
SUT ETHIBOND NAB CT1 #1 30IN (SUTURE) ×4 IMPLANT
SUT MNCRL AB 3-0 PS2 18 (SUTURE) ×2 IMPLANT
SUT MON AB 2-0 CT1 36 (SUTURE) ×2 IMPLANT
SUT VIC AB 1 CT1 27 (SUTURE) ×2
SUT VIC AB 1 CT1 27XBRD ANBCTR (SUTURE) ×1 IMPLANT
SUT VIC AB 2-0 CT1 27 (SUTURE) ×2
SUT VIC AB 2-0 CT1 TAPERPNT 27 (SUTURE) ×1 IMPLANT
SUT VLOC 180 0 24IN GS25 (SUTURE) ×2 IMPLANT
SYR 50ML LL SCALE MARK (SYRINGE) ×2 IMPLANT
TOWEL GREEN STERILE (TOWEL DISPOSABLE) ×2 IMPLANT
TOWEL GREEN STERILE FF (TOWEL DISPOSABLE) ×2 IMPLANT
TRAY CATH 16FR W/PLASTIC CATH (SET/KITS/TRAYS/PACK) IMPLANT
TRAY FOLEY W/BAG SLVR 16FR (SET/KITS/TRAYS/PACK)
TRAY FOLEY W/BAG SLVR 16FR ST (SET/KITS/TRAYS/PACK) IMPLANT
WATER STERILE IRR 1000ML POUR (IV SOLUTION) ×6 IMPLANT

## 2021-08-18 NOTE — Anesthesia Procedure Notes (Signed)
Procedure Name: Intubation ?Date/Time: 08/18/2021 5:52 PM ?Performed by: Reece Agar, CRNA ?Pre-anesthesia Checklist: Patient identified, Emergency Drugs available, Suction available and Patient being monitored ?Patient Re-evaluated:Patient Re-evaluated prior to induction ?Oxygen Delivery Method: Circle System Utilized ?Preoxygenation: Pre-oxygenation with 100% oxygen ?Induction Type: IV induction ?Ventilation: Mask ventilation without difficulty ?Laryngoscope Size: Mac and 4 ?Grade View: Grade I ?Tube type: Oral ?Tube size: 7.5 mm ?Number of attempts: 1 ?Airway Equipment and Method: Stylet ?Placement Confirmation: ETT inserted through vocal cords under direct vision, positive ETCO2 and breath sounds checked- equal and bilateral ?Secured at: 22 cm ?Tube secured with: Tape ?Dental Injury: Teeth and Oropharynx as per pre-operative assessment  ? ? ? ? ?

## 2021-08-18 NOTE — Discharge Instructions (Signed)
? ?Dr. Shaylen Nephew ?Joint Replacement Specialist ?[facility] ?[address] ?[place], [place] [address] ?[phone] ? ? ?TOTAL HIP REPLACEMENT POSTOPERATIVE DIRECTIONS ? ? ? ?Hip Rehabilitation, Guidelines Following Surgery  ? ?WEIGHT BEARING ?Weight bearing as tolerated with assist device (walker, cane, etc) as directed, use it as long as suggested by your surgeon or therapist, typically at least 4-6 weeks. ? ?The results of a hip operation are greatly improved after range of motion and muscle strengthening exercises. Follow all safety measures which are given to protect your hip. If any of these exercises cause increased pain or swelling in your joint, decrease the amount until you are comfortable again. Then slowly increase the exercises. Call your caregiver if you have problems or questions.  ? ?HOME CARE INSTRUCTIONS  ?Most of the following instructions are designed to prevent the dislocation of your new hip.  ?Remove items at home which could result in a fall. This includes throw rugs or furniture in walking pathways.  ?Continue medications as instructed at time of discharge. ?You may have some home medications which will be placed on hold until you complete the course of blood thinner medication. ?You may start showering once you are discharged home. Do not remove your dressing. ?Do not put on socks or shoes without following the instructions of your caregivers.   ?Sit on chairs with arms. Use the chair arms to help push yourself up when arising.  ?Arrange for the use of a toilet seat elevator so you are not sitting low.  ?Walk with walker as instructed.  ?You may resume a sexual relationship in one month or when given the OK by your caregiver.  ?Use walker as long as suggested by your caregivers.  ?You may put full weight on your legs and walk as much as is comfortable. ?Avoid periods of inactivity such as sitting longer than an hour when not asleep. This helps prevent blood  clots.  ?You may return to work once you are cleared by your surgeon.  ?Do not drive a car for 6 weeks or until released by your surgeon.  ?Do not drive while taking narcotics.  ?Wear elastic stockings for two weeks following surgery during the day but you may remove then at night.  ?Make sure you keep all of your appointments after your operation with all of your doctors and caregivers. You should call the office at the above phone number and make an appointment for approximately two weeks after the date of your surgery. ?Please pick up a stool softener and laxative for home use as long as you are requiring pain medications. ?ICE to the affected hip every three hours for 30 minutes at a time and then as needed for pain and swelling. Continue to use ice on the hip for pain and swelling from surgery. You may notice swelling that will progress down to the foot and ankle.  This is normal after surgery.  Elevate the leg when you are not up walking on it.   ?It is important for you to complete the blood thinner medication as prescribed by your doctor. ?Continue to use the breathing machine which will help keep your temperature down.  It is common for your temperature to cycle up and down following surgery, especially at night when you are not up moving around and exerting yourself.  The breathing machine keeps your lungs expanded and your temperature down. ? ?RANGE OF MOTION AND STRENGTHENING EXERCISES  ?These exercises are designed to help you   keep full movement of your hip joint. Follow your caregiver's or physical therapist's instructions. Perform all exercises about fifteen times, three times per day or as directed. Exercise both hips, even if you have had only one joint replacement. These exercises can be done on a training (exercise) mat, on the floor, on a table or on a bed. Use whatever works the best and is most comfortable for you. Use music or television while you are exercising so that the exercises are a  pleasant break in your day. This will make your life better with the exercises acting as a break in routine you can look forward to.  ?Lying on your back, slowly slide your foot toward your buttocks, raising your knee up off the floor. Then slowly slide your foot back down until your leg is straight again.  ?Lying on your back spread your legs as far apart as you can without causing discomfort.  ?Lying on your side, raise your upper leg and foot straight up from the floor as far as is comfortable. Slowly lower the leg and repeat.  ?Lying on your back, tighten up the muscle in the front of your thigh (quadriceps muscles). You can do this by keeping your leg straight and trying to raise your heel off the floor. This helps strengthen the largest muscle supporting your knee.  ?Lying on your back, tighten up the muscles of your buttocks both with the legs straight and with the knee bent at a comfortable angle while keeping your heel on the floor.  ? ?SKILLED REHAB INSTRUCTIONS: ?If the patient is transferred to a skilled rehab facility following release from the hospital, a list of the current medications will be sent to the facility for the patient to continue.  When discharged from the skilled rehab facility, please have the facility set up the patient's Home Health Physical Therapy prior to being released. Also, the skilled facility will be responsible for providing the patient with their medications at time of release from the facility to include their pain medication and their blood thinner medication. If the patient is still at the rehab facility at time of the two week follow up appointment, the skilled rehab facility will also need to assist the patient in arranging follow up appointment in our office and any transportation needs. ? ?POST-OPERATIVE OPIOID TAPER INSTRUCTIONS: ?It is important to wean off of your opioid medication as soon as possible. If you do not need pain medication after your surgery it is ok  to stop day one. ?Opioids include: ?Codeine, Hydrocodone(Norco, Vicodin), Oxycodone(Percocet, oxycontin) and hydromorphone amongst others.  ?Long term and even short term use of opiods can cause: ?Increased pain response ?Dependence ?Constipation ?Depression ?Respiratory depression ?And more.  ?Withdrawal symptoms can include ?Flu like symptoms ?Nausea, vomiting ?And more ?Techniques to manage these symptoms ?Hydrate well ?Eat regular healthy meals ?Stay active ?Use relaxation techniques(deep breathing, meditating, yoga) ?Do Not substitute Alcohol to help with tapering ?If you have been on opioids for less than two weeks and do not have pain than it is ok to stop all together.  ?Plan to wean off of opioids ?This plan should start within one week post op of your joint replacement. ?Maintain the same interval or time between taking each dose and first decrease the dose.  ?Cut the total daily intake of opioids by one tablet each day ?Next start to increase the time between doses. ?The last dose that should be eliminated is the evening dose.  ? ? ?MAKE   SURE YOU:  ?Understand these instructions.  ?Will watch your condition.  ?Will get help right away if you are not doing well or get worse. ? ?Pick up stool softner and laxative for home use following surgery while on pain medications. ?Do not remove your dressing. ?The dressing is waterproof--it is OK to take showers. ?Continue to use ice for pain and swelling after surgery. ?Do not use any lotions or creams on the incision until instructed by your surgeon. ?Total Hip Protocol.  ?Every 8 hours, empty JP drain, record drainage in log sheet, and recharge the drain. Bring log to your follow up appointment. ?

## 2021-08-18 NOTE — Interval H&P Note (Signed)
History and Physical Interval Note: ? ?08/18/2021 ?5:10 PM ? ?[name]  has presented today for surgery, with the diagnosis of Left Hip Fx.  The various methods of treatment have been discussed with the patient and family. After consideration of risks, benefits and other options for treatment, the patient has consented to  Procedure(s): ?TOTAL HIP ARTHROPLASTY ANTERIOR APPROACH (Left) as a surgical intervention.  The patient's history has been reviewed, patient examined, no change in status, stable for surgery.  I have reviewed the patient's chart and labs.  Questions were answered to the patient's satisfaction.   ? ?The risks, benefits, and alternatives were discussed with the patient. There are risks associated with the surgery including, but not limited to, problems with anesthesia (death), infection, instability (giving out of the joint), dislocation, differences in leg length/angulation/rotation, fracture of bones, loosening or failure of implants, hematoma (blood accumulation) which may require surgical drainage, blood clots, pulmonary embolism, nerve injury (foot drop and lateral thigh numbness), and blood vessel injury. The patient understands these risks and elects to proceed. ? ?Heparin gtt held. Hgb adequate.  ? ? ?[name] ? ? ?

## 2021-08-18 NOTE — Transfer of Care (Signed)
Immediate Anesthesia Transfer of Care Note ? ?Patient: [name] ? ?Procedure(s) Performed: TOTAL HIP ARTHROPLASTY ANTERIOR APPROACH (Left: Hip) ? ?Patient Location: PACU ? ?Anesthesia Type:General ? ?Level of Consciousness: awake and responds to stimulation ? ?Airway & Oxygen Therapy: Patient Spontanous Breathing and Patient connected to nasal cannula oxygen ? ?Post-op Assessment: Report given to RN and Post -op Vital signs reviewed and stable ? ?Post vital signs: Reviewed and stable ? ?Last Vitals:  ?Vitals Value Taken Time  ?BP 128/97 08/18/21 2018  ?Temp    ?Pulse 74 08/18/21 2022  ?Resp 18 08/18/21 2022  ?SpO2 94 % 08/18/21 2022  ?Vitals shown include unvalidated device data. ? ?Last Pain:  ?Vitals:  ? 08/18/21 1638  ?TempSrc: Oral  ?PainSc: 5   ?   ? ?Patients Stated Pain Goal: 2 (08/18/21 1638) ? ?Complications: No notable events documented. ?

## 2021-08-18 NOTE — Progress Notes (Signed)
PT Cancellation Note ? ?Patient Details ?Name: [name] ?MRN: [id number] ?DOB: May 05, 1946 ? ? ?Cancelled Treatment:    Reason Eval/Treat Not Completed: Medical issues which prohibited therapy;Patient not medically ready. Per chart review, pt to go to OR for hip surgery at 4:30 pm today. PT will initiate evaluation s/p surgery as able.  ? ? ?Thelma Comp ?08/18/2021, 8:57 AM ? ?Rolinda Roan, PT, DPT ?Acute Rehabilitation Services ?Pager: [phone] ?Office: [phone] ? ?

## 2021-08-18 NOTE — Progress Notes (Signed)
ANTICOAGULATION CONSULT NOTE - Initial Consult ? ?Pharmacy Consult for heparin ?Indication: atrial fibrillation ? ?Allergies  ?Allergen Reactions  ? Bactrim [Sulfamethoxazole-Trimethoprim] Anaphylaxis  ?  Ulcers  ? Ciprofloxacin Swelling  ?  Tongue  ? Crestor [Rosuvastatin Calcium]   ?  Severe Myalgias  ? ? ?Patient Measurements: ?Height: 6' (182.9 cm) ?Weight: 81 kg (178 lb 9.2 oz) ?IBW/kg (Calculated) : 77.6 ?Heparin Dosing Weight: 81 kg ? ?Vital Signs: ?Temp: 98.9 ?F (37.2 ?C) (03/13 0539) ?Temp Source: Oral (03/13 0539) ?BP: 141/75 (03/13 0539) ?Pulse Rate: 71 (03/13 0539) ? ?Labs: ?Recent Labs  ?  08/15/21 ?2045 08/16/21 ?0240 08/17/21 ?6387 08/18/21 ?0427  ?HGB 15.0 13.5 13.4 12.9*  ?HCT 43.9 40.5 39.8 39.2  ?PLT 206 192 173 156  ?LABPROT 24.2* 25.5* 24.6* 18.5*  ?INR 2.2* 2.3* 2.2* 1.5*  ?CREATININE 1.05  --  1.31* 1.08  ? ? ?Estimated Creatinine Clearance: 64.9 mL/min (by C-G formula based on SCr of 1.08 mg/dL). ? ? ?Medical History: ?Past Medical History:  ?Diagnosis Date  ? BPH (benign prostatic hyperplasia)   ? COPD (chronic obstructive pulmonary disease) ([place]) 2021  ? Coronary artery disease   ? a. s/p PCI to LCx in 2007; b. MV 2017 no sig ischemia, EF 59%, nl study. c. Cath 01/2020 without significant recurrent dz.  ? Depression   ? GERD (gastroesophageal reflux disease)   ? Hyperlipidemia   ? Hypertension   ? IBS (irritable bowel syndrome)   ? Myocardial infarction [facility])   ? 12-13 yrs ago   ? PAF (paroxysmal atrial fibrillation) ([place])   ? a. CHADS2VASc => 5 (HTN, age x 1, stroke x 2, vascular disease); b. on Coumadin   ? PNA (pneumonia)   ? Skin cancer   ? face   ? Sleep apnea   ? off cpap   ? Stroke [facility])   ? a. x 2 with residual left-sided weakness  ? ? ?Medications:  ?Medications Prior to Admission  ?Medication Sig Dispense Refill Last Dose  ? acetaminophen (TYLENOL) 325 MG tablet Take 2 tablets (650 mg total) by mouth every 6 (six) hours as needed for mild pain or moderate pain. 30 tablet 1   ?  amiodarone (PACERONE) 200 MG tablet Take 1 tablet (200 mg total) by mouth daily. 90 tablet 2   ? amiodarone (PACERONE) 200 MG tablet Take 1 tablet (200 mg total) by mouth daily. 90 tablet 3   ? amLODipine (NORVASC) 2.5 MG tablet Take 1 tablet (2.5 mg total) by mouth daily. 90 tablet 3   ? aspirin 81 MG tablet Take 81 mg by mouth daily with supper.     ? bisoprolol (ZEBETA) 10 MG tablet TAKE 1/2 TABLET BY MOUTH DAILY 45 tablet 3   ? bisoprolol (ZEBETA) 5 MG tablet Take 0.5 tablets (2.5 mg total) by mouth daily. 30 tablet 1   ? citalopram (CELEXA) 40 MG tablet TAKE 1 TABLET BY MOUTH EVERY DAY 90 tablet 2   ? diphenhydramine-acetaminophen (TYLENOL PM) 25-500 MG TABS tablet Take 2 tablets by mouth at bedtime as needed (leg pain).     ? fluticasone (FLONASE) 50 MCG/ACT nasal spray Place 1 spray into both nostrils at bedtime as needed (stuffy nose).     ? lisinopril (ZESTRIL) 40 MG tablet Take 1 tablet (40 mg total) by mouth daily with supper. For blood pressure. 30 tablet 1   ? Multiple Vitamins-Minerals (MULTIVITAMIN WITH MINERALS) tablet Take 1 tablet by mouth daily with supper. Centrum Silver     ?  nitroGLYCERIN (NITROSTAT) 0.4 MG SL tablet Place 1 tablet (0.4 mg total) every 5 (five) minutes as needed under the tongue for chest pain. 25 tablet 3   ? omeprazole (PRILOSEC) 40 MG capsule TAKE 1 CAPSULE (40 MG TOTAL) BY MOUTH 2 (TWO) TIMES DAILY BEFORE A MEAL. FOR HEARTBURN. 180 capsule 1   ? ondansetron (ZOFRAN-ODT) 4 MG disintegrating tablet TAKE 1 TABLET BY MOUTH EVERY 8 HOURS AS NEEDED FOR NAUSEA AND VOMITING 20 tablet 0   ? PRALUENT 75 MG/ML SOAJ INJECT 75 MG INTO THE SKIN EVERY 14 (FOURTEEN) DAYS. 6 mL 3   ? pramipexole (MIRAPEX) 0.25 MG tablet Take 1 tablet (0.25 mg total) by mouth at bedtime. 90 tablet 1   ? pravastatin (PRAVACHOL) 80 MG tablet Take 1 tablet (80 mg total) by mouth daily. For cholesterol.     ? psyllium (HYDROCIL/METAMUCIL) 95 % PACK Take 1 packet by mouth daily.     ? senna (SENOKOT) 8.6 MG TABS  tablet Take 1 tablet (8.6 mg total) by mouth daily. For constipation 90 tablet 0   ? solifenacin (VESICARE) 5 MG tablet Take 5 mg by mouth daily.     ? tiZANidine (ZANAFLEX) 4 MG tablet TAKE 1 TABLET BY MOUTH AT BEDTIME AS NEEDED FOR MUSCLE SPASMS. (Patient taking differently: Take 4 mg by mouth 3 (three) times daily.) 30 tablet 1   ? triamcinolone cream (KENALOG) 0.1 % Apply 1 application topically daily as needed (rash around nose).     ? vitamin B-12 (CYANOCOBALAMIN) 1000 MCG tablet Take 1,000 mcg by mouth daily with supper.     ? warfarin (COUMADIN) 7.5 MG tablet Take 1 tablet (7.'5mg'$  ) by mouth on Sun and Thurs and all other days take 1/2 tablet (3.'75mg'$ ) at supper time. 70 tablet 1   ? ? ?Assessment: ?53 yom who admitted after fall resulting in left femoral neck fracture. He is on warfarin PTA for PAF. Pharmacy consulted to begin IV heparin when INR <2 in preparation for surgical repair. ? ?INR down to 1.5 today. No bleeding noted, Hgb down to 12.9, platelets down to 156.  ? ?Goal of Therapy:  ?Heparin level 0.3-0.7 units/ml ?Monitor platelets by anticoagulation protocol: Yes ?  ?Plan:  ?Heparin 2000 unit IV bolus then begin infusion at 1150 units/hr ?Per Ortho, heparin to stop at 10:00 for OR ?F/U [facility] plan ? ?Thank you for involving pharmacy in this patient's care. ? ?Renold Genta, PharmD, BCPS ?Clinical Pharmacist ?Clinical phone for 08/18/2021 until 3p is [phone] ?08/18/2021 7:28 AM ? ?**Pharmacist phone directory can be found on [email or web address] listed under [place]** ? ? ? ?

## 2021-08-18 NOTE — Op Note (Signed)
OPERATIVE REPORT ? ?SURGEON: Rod Can, MD  ? ?ASSISTANT: Costella Hatcher, PA-C ? ?PREOPERATIVE DIAGNOSIS: Comminuted displaced vertical basicervical Left femoral neck fracture.  ? ?POSTOPERATIVE DIAGNOSIS: Comminuted displaced vertical basicervical Left femoral neck fracture.  ? ?PROCEDURE: Left total hip arthroplasty, anterior approach.  ? ?IMPLANTS: Biomet Arcos One Piece stem, size 16x110m, high offset. ?Biomet G7 OsseoTi Cup, size 56 mm. ?Biomet Vivacit-E liner, size 36 mm, F, neutral. ?Biomet Biolox ceramic head ball, size 36 - 3 mm. ?Zimmer 1.8 mm adult reconstruction cable x1. ? ?ANESTHESIA:  General ? ?ANTIBIOTICS: 2g ancef. ? ?ESTIMATED BLOOD LOSS:-500 mL   ? ?DRAINS: 10 mm flat JP drain in the subcutaneous tissue. ? ?COMPLICATIONS: None ?  ?CONDITION: PACU - hemodynamically stable.  ? ?BRIEF CLINICAL NOTE: William Phiferis a 76y.o. male with a displaced Left femoral neck fracture. The patient was admitted to the hospitalist service and underwent perioperative risk stratification and medical optimization. Coumadin was stopped at admission, and a heparin drip was initiated. The heparin drip was stopped 6 hours before surgery. The risks, benefits, and alternatives to total hip arthroplasty were explained, and the patient elected to proceed. ? ?PROCEDURE IN DETAIL: The patient was taken to the operating room and general anesthesia was induced on the hospital bed.  The patient was then positioned on the Hana table.  All bony prominences were well padded.  The hip was prepped and draped in the normal sterile surgical fashion.  A time-out was called verifying side and site of surgery. Antibiotics were given within 60 minutes of beginning the procedure. ?  ?Bikini incision was made, and the direct anterior approach to the hip was performed through the Hueter interval.  Lateral femoral circumflex vessels were treated with the Auqumantys. The anterior capsule was exposed and an inverted T capsulotomy was  made.  Fracture hematoma was encountered and evacuated. The patient was found to have a comminuted Left basicervical femoral neck fracture.  I freshened the femoral neck cut with a saw.  I removed the femoral neck fragment.  A corkscrew was placed into the head and the head was removed.  This was passed to the back table and was measured. The pubofemoral ligament was released subperiosteally to the lesser trochanter. There was a vertical defect in the calcar proceeding to just above the lesser trochanter. I placed a single prophylactic subperiosteal adult reconstruction cable just superior to the lesser trochanter. ? ?Acetabular exposure was achieved, and the pulvinar and labrum were excised. Sequential reaming of the acetabulum was then performed up to a size 55 mm reamer under direct visulization. A 56 mm cup was then opened and impacted into place at approximately 40 degrees of abduction and 20 degrees of anteversion. The final polyethylene liner was impacted into place and acetabular osteophytes were removed.  ?  ?I then gained femoral exposure taking care to protect the abductors and greater trochanter.  This was performed using standard external rotation, extension, and adduction.  The superior hip capsule was incised longitudinally, taking care to stay lateral to the posterior border of the femoral neck. A cookie cutter was used to enter the femoral canal, and then the femoral canal finder was placed.  Sequential reaming was performed up to a size 15.5 mm, and broaching was performed up to 16 mm.  Calcar planer was used on the femoral neck remnant.  I placed a high offset neck and a trial head ball.  The hip was reduced.  Leg lengths and offset were checked fluoroscopically.  The hip was dislocated and trial components were removed.  The final implants were placed, and the hip was reduced.  Fluoroscopy was used to confirm component position and leg lengths.  At 90 degrees of external rotation and full  extension, the hip was stable to an anterior directed force. ?  ?The wound was copiously irrigated with Irrisept solution and normal saline using pule lavage.  Marcaine solution was injected into the periarticular soft tissue.  The wound was closed in layers using #1 Stratafix for the fascia, 2-0 Vicryl for the subcutaneous fat, 2-0 Monocryl for the deep dermal layer, and staples + Dermabond for the skin.  The JP drain was sewn in with 3-0 nylon. Once the glue was fully dried, an Aquacell Ag dressing was applied.  The patient was transported to the recovery room in stable condition.  Sponge, needle, and instrument counts were correct at the end of the case x2.  The patient tolerated the procedure well and there were no known complications. ? ?Please note that a surgical assistant was a medical necessity for this procedure to perform it in a safe and expeditious manner. Assistant was necessary to provide appropriate retraction of vital neurovascular structures, to prevent femoral fracture, and to allow for anatomic placement of the prosthesis. ?

## 2021-08-18 NOTE — Progress Notes (Signed)
OT Cancellation Note ? ?Patient Details ?Name: [name] ?MRN: [id number] ?DOB: 1945-11-01 ? ? ?Cancelled Treatment:    Reason Eval/Treat Not Completed: Patient not medically ready (Hip surgery at 4:30pm) ? ?[name] ?08/18/2021, 8:23 AM ?

## 2021-08-18 NOTE — Progress Notes (Addendum)
ANTICOAGULATION CONSULT NOTE - Initial Consult ? ?Pharmacy Consult for heparin ?Indication: atrial fibrillation ? ?Allergies  ?Allergen Reactions  ? Bactrim [Sulfamethoxazole-Trimethoprim] Anaphylaxis  ?  Ulcers  ? Ciprofloxacin Swelling  ?  Tongue  ? Crestor [Rosuvastatin Calcium] Other (See Comments)  ?  Severe Myalgias  ? ? ?Patient Measurements: ?Height: 6' (182.9 cm) ?Weight: 81 kg (178 lb 9.2 oz) ?IBW/kg (Calculated) : 77.6 ?Heparin Dosing Weight: 81 kg ? ?Vital Signs: ?Temp: 97.6 ?F (36.4 ?C) (03/13 2020) ?Temp Source: Oral (03/13 1638) ?BP: 112/69 (03/13 2035) ?Pulse Rate: 67 (03/13 2035) ? ?Labs: ?Recent Labs  ?  08/15/21 ?2045 08/16/21 ?0240 08/17/21 ?8003 08/18/21 ?0427  ?HGB 15.0 13.5 13.4 12.9*  ?HCT 43.9 40.5 39.8 39.2  ?PLT 206 192 173 156  ?LABPROT 24.2* 25.5* 24.6* 18.5*  ?INR 2.2* 2.3* 2.2* 1.5*  ?CREATININE 1.05  --  1.31* 1.08  ? ? ? ?Estimated Creatinine Clearance: 64.9 mL/min (by C-G formula based on SCr of 1.08 mg/dL). ? ? ?Medical History: ?Past Medical History:  ?Diagnosis Date  ? BPH (benign prostatic hyperplasia)   ? COPD (chronic obstructive pulmonary disease) ([place]) 2021  ? Coronary artery disease   ? a. s/p PCI to LCx in 2007; b. MV 2017 no sig ischemia, EF 59%, nl study. c. Cath 01/2020 without significant recurrent dz.  ? Depression   ? GERD (gastroesophageal reflux disease)   ? Hyperlipidemia   ? Hypertension   ? IBS (irritable bowel syndrome)   ? Myocardial infarction [facility])   ? 12-13 yrs ago   ? PAF (paroxysmal atrial fibrillation) ([place])   ? a. CHADS2VASc => 5 (HTN, age x 1, stroke x 2, vascular disease); b. on Coumadin   ? PNA (pneumonia)   ? Skin cancer   ? face   ? Sleep apnea   ? off cpap   ? Stroke [facility])   ? a. x 2 with residual left-sided weakness  ?  ? ? ?Assessment: ?24 yom who admitted after fall resulting in left femoral neck fracture. He is on warfarin PTA for PAF. Pharmacy consulted to begin IV heparin when INR < 2 in preparation for surgical repair. ? ?Patient is now s/p  left total hip arthroplasty. Discussed anticoagulation plan with Dr. Lyla Glassing who would like patient to switch from heparin to enoxaparin; patient can receive prophylactic dose enoxaparin 3/14 AM and treatment dose starting 3/14 PM. Warfarin to start POD 0 per consult.  ? ?PTA warfarin 7.'5mg'$  on Sun/Thurs, 3.'75mg'$  all other days  ? ?Goal of Therapy:  ?Heparin level 0.3-0.7 units/ml ?Monitor platelets by anticoagulation protocol: Yes ?  ?Plan:  ?Stop heparin per Ortho ?Warfarin 3 mg x1  ?Enoxaparin '40mg'$  x1 3/14 AM ?Increase to enoxaparin '80mg'$  q12 hr starting 3/14 PM  ?Monitor for signs/symptoms of bleeding  ? ? ?Thank you for involving pharmacy in this patient's care. ? ?Benetta Spar, PharmD, BCPS, BCCP ?Clinical Pharmacist ? ?Please check AMION for all [place] phone numbers ?After 10:00 PM, call [place] [phone] ? ?

## 2021-08-18 NOTE — Progress Notes (Signed)
Due to OR availability, surgery will be around 4:30PM today. Please continue NPO. D/C heparin gtt at 1000 today.  ?

## 2021-08-18 NOTE — Progress Notes (Signed)
Family Medicine Teaching Service ?Daily Progress Note ?Intern Pager: [phone] ? ?Patient name: [name] Medical record number: [id number] ?Date of birth: 1946-04-24 Age: 76 y.o. Gender: male ? ?Primary Care Provider: [name], MD ?Consultants: Ortho ?Code Status: DNR ? ?Pt Overview and Major Events to Date:  ?08/16/2021-admitted for left femoral fracture ? ?Assessment and Plan: ?Christen Bedoya is a 76 year old male presenting with recurrent falls and consequent left femoral fracture.  Past medical history significant for progressive supranuclear palsy, paroxysmal A-fib on Coumadin, CAD s/p stent (2017), MI (2007), CVA x2, COPD, GERD, hypertension, hyperlipidemia, BPH. ? ?Left femoral neck fracture: Stable ?Patient only required 1 dose of morphine overnight.  INR 1.5 this a.m.  Surgery with ortho today.  Hemoglobin decreased from 13.4 to 12.9. ?-Orthopedic surgery consulted, appreciate recommendations ?-Surgery '@1645'$  ?-Hold Coumadin, continue heparin drip ?-PT/OT eval and treat, appreciate assistance ?-Tylenol every 6 hours ?-Morphine 4 mg every 4 hours as needed ? ?Progressive supranuclear palsy: Chronic, stable ?Home medications include Mirapex as needed, Zanaflex 4 mg as needed for leg spasms.  Palliative medicine on board and will follow-up outpatient with patient. ?-Hold home medications while n.p.o. ? ?Paroxysmal A-fib on Coumadin: Chronic, stable ?Rate controlled. ?-Continue amiodarone and bisoprolol ?Hold warfarin and aspirin ? ?Constipation ?Pt has continued to not stool while hospitalized. States he feels his abdomen is tense but it does not bother him currently. Currently holding home medications of senna, metamucil, and miralax due to pending surgery. ?-Restart home medications after surgery ? ?CVA, HLD, GERD, HTN: Chronic, stable ?Holding home medications of aspirin 81, pravastatin, Praluent, Meprazole, lisinopril. ?-Continue to hold while n.p.o. for surgery ? ?FEN/GI: N.p.o. ?PPx: Stop  heparin drip '@1000'$  ?Dispo:Home with home health  in 2-3 days. Barriers include surgery.  ? ?Subjective:  ?Patient states he is doing well today and does not have pain at baseline but only when he moves his leg.  No concerns or questions at this time. ? ?Objective: ?Temp:  [97.9 ?F (36.6 ?C)-99.1 ?F (37.3 ?C)] 98.9 ?F (37.2 ?C) (03/13 0539) ?Pulse Rate:  [64-71] 71 (03/13 0539) ?Resp:  [14-15] 15 (03/13 0539) ?BP: (135-141)/(70-77) 141/75 (03/13 0539) ?SpO2:  [93 %-95 %] 95 % (03/13 0539) ?Physical Exam: ?General: Awake, alert, NAD ?Cardiovascular: RRR, no murmurs auscultated ?Respiratory: CTA B, no increased work of breathing ?Abdomen: Soft, nontender, normoactive bowel sounds ?Extremities: No edema or bruising noted of left leg ? ?Laboratory: ?Recent Labs  ?Lab 08/16/21 ?0240 08/17/21 ?1761 08/18/21 ?0427  ?WBC 11.7* 9.6 8.8  ?HGB 13.5 13.4 12.9*  ?HCT 40.5 39.8 39.2  ?PLT 192 173 156  ? ?Recent Labs  ?Lab 08/15/21 ?2045 08/17/21 ?6073 08/18/21 ?0427  ?NA 139 137 138  ?K 3.9 4.2 4.2  ?CL 104 103 103  ?CO2 '25 25 29  '$ ?BUN '11 19 15  '$ ?CREATININE 1.05 1.31* 1.08  ?CALCIUM 8.9 8.8* 8.8*  ?PROT 6.8  --   --   ?BILITOT 0.4  --   --   ?ALKPHOS 114  --   --   ?ALT 55*  --   --   ?AST 45*  --   --   ?GLUCOSE 107* 131* 131*  ? ?INR: 1.5 ? ?Imaging/Diagnostic Tests: ?No results found.  ? ?Wells Guiles, DO ?08/18/2021, 7:56 AM ?PGY-1, [place] [facility] ?[place] Intern pager: [phone], text pages welcome ? ?

## 2021-08-18 NOTE — Anesthesia Preprocedure Evaluation (Addendum)
Anesthesia Evaluation  ?Patient identified by MRN, date of birth, ID band ?Patient awake ? ? ? ?Reviewed: ?Allergy & Precautions, NPO status , Patient's Chart, lab work & pertinent test results ? ?Airway ?Mallampati: II ? ?TM Distance: >3 FB ?Neck ROM: Full ? ? ? Dental ? ?(+) Partial Lower, Partial Upper ?  ?Pulmonary ?sleep apnea , COPD, former smoker,  ?  ?Pulmonary exam normal ? ? ? ? ? ? ? Cardiovascular ?hypertension, Pt. on medications and Pt. on home beta blockers ?+ CAD, + Past MI (2007) and + Cardiac Stents (2017)  ?+ dysrhythmias (on Coumadin, last taken 3/10) Atrial Fibrillation  ?Rhythm:Regular Rate:Normal ? ? ?  ?Neuro/Psych ?Depression Supranuclear palsy ? Neuromuscular disease CVA (left sided weakness), Residual Symptoms   ? GI/Hepatic ?Neg liver ROS, GERD  Medicated,  ?Endo/Other  ?negative endocrine ROS ? Renal/GU ?negative Renal ROS  ?negative genitourinary ?  ?Musculoskeletal ?Left hip fracture  ? Abdominal ?Normal abdominal exam  (+)   ?Peds ? Hematology ?negative hematology ROS ?(+)   ?Anesthesia Other Findings ? ? Reproductive/Obstetrics ? ?  ? ? ? ? ? ? ? ? ? ? ? ? ? ?  ?  ? ? ? ? ? ? ? ?Anesthesia Physical ?Anesthesia Plan ? ?ASA: 3 ? ?Anesthesia Plan: General  ? ?Post-op Pain Management:   ? ?Induction: Intravenous ? ?PONV Risk Score and Plan: 2 and Ondansetron, Dexamethasone and Treatment may vary due to age or medical condition ? ?Airway Management Planned: Mask and Oral ETT ? ?Additional Equipment: None ? ?Intra-op Plan:  ? ?Post-operative Plan: Extubation in OR ? ?Informed Consent: I have reviewed the patients History and Physical, chart, labs and discussed the procedure including the risks, benefits and alternatives for the proposed anesthesia with the patient or authorized representative who has indicated his/her understanding and acceptance.  ? ?Patient has DNR.  ?Discussed DNR with patient. ?  ?Dental advisory given ? ?Plan Discussed with:  CRNA ? ?Anesthesia Plan Comments: (Lab Results ?     Component                Value               Date                 ?     WBC                      8.8                 08/18/2021           ?     HGB                      12.9 (L)            08/18/2021           ?     HCT                      39.2                08/18/2021           ?     MCV                      93.1                08/18/2021           ?  PLT                      156                 08/18/2021           ?Lab Results ?     Component                Value               Date                 ?     NA                       138                 08/18/2021           ?     K                        4.2                 08/18/2021           ?     CO2                      29                  08/18/2021           ?     GLUCOSE                  131 (H)             08/18/2021           ?     BUN                      15                  08/18/2021           ?     CREATININE               1.08                08/18/2021           ?     CALCIUM                  8.8 (L)             08/18/2021           ?     EGFR                     74                  12/23/2020           ?     GFRNONAA                 >60                 08/18/2021           ?Lab Results ?     Component  Value               Date                 ?     INR                      1.5 (H)             08/18/2021           ?     INR                      2.2 (H)             08/17/2021           ?     INR                      2.3 (H)             08/16/2021           ? ?ECHO 04/21: ??1. Left ventricular ejection fraction, by estimation, is 60 to 65%. The  ?left ventricle has normal function. The left ventricle has no regional  ?wall motion abnormalities. Left ventricular diastolic parameters are  ?consistent with Grade I diastolic  ?dysfunction (impaired relaxation).  ??2. Right ventricular systolic function is normal. The right ventricular  ?size is normal. There is mildly elevated pulmonary  artery systolic  ?pressure. The estimated right ventricular systolic pressure is 75.1 mmHg.  ??3. Left atrial size was mildly dilated.  ??4. Right atrial size was mildly dilated. )  ? ? ? ? ?Anesthesia Quick Evaluation ? ?

## 2021-08-19 DIAGNOSIS — Z96642 Presence of left artificial hip joint: Secondary | ICD-10-CM | POA: Diagnosis not present

## 2021-08-19 DIAGNOSIS — S72002A Fracture of unspecified part of neck of left femur, initial encounter for closed fracture: Secondary | ICD-10-CM | POA: Diagnosis not present

## 2021-08-19 LAB — BASIC METABOLIC PANEL
Anion gap: 8 (ref 5–15)
BUN: 21 mg/dL (ref 8–23)
CO2: 25 mmol/L (ref 22–32)
Calcium: 8.3 mg/dL — ABNORMAL LOW (ref 8.9–10.3)
Chloride: 106 mmol/L (ref 98–111)
Creatinine, Ser: 1.2 mg/dL (ref 0.61–1.24)
GFR, Estimated: >60 mL/min (ref >60)
Glucose, Bld: 189 mg/dL — ABNORMAL HIGH (ref 70–99)
Potassium: 4.2 mmol/L (ref 3.5–5.1)
Sodium: 139 mmol/L (ref 135–145)

## 2021-08-19 LAB — CBC
HCT: 34 % — ABNORMAL LOW (ref 39.0–52.0)
Hemoglobin: 11.3 g/dL — ABNORMAL LOW (ref 13.0–17.0)
MCH: 31.3 pg (ref 26.0–34.0)
MCHC: 33.2 g/dL (ref 30.0–36.0)
MCV: 94.2 fL (ref 80.0–100.0)
Platelets: 145 10*3/uL — ABNORMAL LOW (ref 150–400)
RBC: 3.61 MIL/uL — ABNORMAL LOW (ref 4.22–5.81)
RDW: 13.4 % (ref 11.5–15.5)
WBC: 10.4 10*3/uL (ref 4.0–10.5)
nRBC: 0 % (ref 0.0–0.2)

## 2021-08-19 LAB — PROTIME-INR
INR: 1.3 — ABNORMAL HIGH (ref 0.8–1.2)
Prothrombin Time: 16.4 seconds — ABNORMAL HIGH (ref 11.4–15.2)

## 2021-08-19 MED ORDER — POLYETHYLENE GLYCOL 3350 17 G PO PACK
34.0000 g | PACK | Freq: Two times a day (BID) | ORAL | Status: DC
  Administered 2021-08-19 – 2021-08-20 (×4): 34 g via ORAL
  Filled 2021-08-19 (×4): qty 2

## 2021-08-19 MED ORDER — POLYETHYLENE GLYCOL 3350 17 G PO PACK: 17.0000 g | PACK | Freq: Two times a day (BID) | ORAL | Status: DC

## 2021-08-19 MED ORDER — DEXTROSE 50 % IV SOLN
INTRAVENOUS | Status: AC
  Filled 2021-08-19: qty 50

## 2021-08-19 MED ORDER — SENNA 8.6 MG PO TABS
2.0000 | ORAL_TABLET | Freq: Two times a day (BID) | ORAL | Status: DC
  Administered 2021-08-19 – 2021-08-20 (×3): 17.2 mg via ORAL
  Filled 2021-08-19 (×3): qty 2

## 2021-08-19 MED ORDER — WARFARIN SODIUM 5 MG PO TABS
5.0000 mg | ORAL_TABLET | Freq: Once | ORAL | Status: AC
  Administered 2021-08-19: 5 mg via ORAL
  Filled 2021-08-19: qty 1

## 2021-08-19 NOTE — Evaluation (Addendum)
Physical Therapy Evaluation ?Patient Details ?Name: [name] ?MRN: [id number] ?DOB: March 29, 1946 ?Today's Date: 08/19/2021 ? ?History of Present Illness ? Pt is a 76 yr old hada witness fall on 3/10  a comminuted displaced vertical basicervical left femoral neck fracture and s/p L THA with an anterior approach. PMH: efib, COPD, GERD, HLD, CAD s/p stent CVA x2  with L side weakness, progressive supranuclear palsy, IBS, MI, PNA  ?Clinical Impression ? Pt had pain and difficulty moving his legs during isolated strength testing due to neurological factors. He was able to express greater strength and ROM during functional movements. Mod assist +2 was used to transfer to standing and Stedy was used to transfer pt to recliner. During physical exam patient showed deficits in strength, endurance, and activity tolerance. Pt has good support at home with 24/7 assistance with his wife. Recommending therapy services at acute inpatient rehab to address the previously stated deficits. Will continue to follow acutely to maximize functional mobility, independence and safety. ? ?   ? ?Recommendations for follow up therapy are one component of a multi-disciplinary discharge planning process, led by the attending physician.  Recommendations may be updated based on patient status, additional functional criteria and insurance authorization. ? ?Follow Up Recommendations Acute inpatient rehab (3hours/day) ? ?  ?Assistance Recommended at Discharge Frequent or constant Supervision/Assistance  ?Patient can return home with the following ? Two people to help with walking and/or transfers;Two people to help with bathing/dressing/bathroom;Direct supervision/assist for medications management;Help with stairs or ramp for entrance;Assist for transportation;Assistance with cooking/housework ? ?  ?Equipment Recommendations    ?Recommendations for Other Services ? Rehab consult  ?  ?Functional Status Assessment Patient has had a recent decline in  their functional status and demonstrates the ability to make significant improvements in function in a reasonable and predictable amount of time.  ? ?  ?Precautions / Restrictions Precautions ?Precautions: Anterior Hip ?Restrictions ?Weight Bearing Restrictions: Yes ?LLE Weight Bearing: Weight bearing as tolerated  ? ?  ? ?Mobility ? Bed Mobility ?Overal bed mobility: Needs Assistance ?Bed Mobility: Supine to Sit, Sit to Supine ?  ?  ?Supine to Sit: +2 for physical assistance, +2 for safety/equipment, Max assist ?  ?General bed mobility comments: Helicopter technique was used to sit pt up EOB. Pt required a lot of cuing but was able to participate. ?  ? ?Transfers ?Overall transfer level: Needs assistance ?  ?Transfers: Sit to/from Stand, Bed to chair/wheelchair/BSC ?Sit to Stand: +2 physical assistance, Mod assist ?  ?  ?  ?  ?  ?General transfer comment: [name] was utilized for transfer. Pt has R sided preference and needed support when sitting on Stedy. ?Transfer via Lift Equipment: Stedy ? ?Ambulation/Gait ?  ?  ?  ?  ?  ?  ?  ?  ? ?Stairs ?  ?  ?  ?  ?  ? ?Wheelchair Mobility ?  ? ?Modified Rankin (Stroke Patients Only) ?  ? ?  ? ?Balance Overall balance assessment: Needs assistance ?Sitting-balance support: Bilateral upper extremity supported, Feet supported ?Sitting balance-Leahy Scale: Poor ?Sitting balance - Comments: Pt was able to sit EOB with supervision when holding onto the [facility] but required min assist for sitting balance. ?Postural control: Right lateral lean ?Standing balance support: Bilateral upper extremity supported, Reliant on assistive device for balance ?Standing balance-Leahy Scale: Poor ?Standing balance comment: Pt required assistance to stay standing on stedy. ?  ?  ?  ?  ?  ?  ?  ?  ?  ?  ?  ?   ? ? ? ?  Pertinent Vitals/Pain Pain Assessment ?Pain Assessment: Faces ?Faces Pain Scale: Hurts even more ?Pain Descriptors / Indicators: Operative site guarding  ? ? ?Home Living Family/patient  expects to be discharged to:: Private residence ?Living Arrangements: Spouse/significant other ?Available Help at Discharge: Family;Available 24 hours/day ?Type of Home: House ?Home Access: Level entry ?  ?  ?  ?Home Layout: Two level;Able to live on main level with bedroom/bathroom ?Home Equipment: Rolling Walker (2 wheels);Grab bars - toilet;Grab bars - tub/shower;Cane - single point Optician, dispensing) ?   ?  ?Prior Function Prior Level of Function : Needs assist ?  ?  ?  ?Physical Assist : Mobility (physical);ADLs (physical) ?Mobility (physical): Transfers;Gait ?ADLs (physical): Bathing;Dressing;Toileting ?Mobility Comments: Pt's wife assists with mobility needs ?ADLs Comments: Pt's wife assists with ADL's ?  ? ? ?Hand Dominance  ? Dominant Hand: Right ? ?  ?Extremity/Trunk Assessment  ? Upper Extremity Assessment ?Upper Extremity Assessment: Defer to OT evaluation ?  ? ?Lower Extremity Assessment ?Lower Extremity Assessment: RLE deficits/detail;LLE deficits/detail ?RLE Deficits / Details: Pt was limited in his ability to move his legs for isolated strength testing he was able to lift his leg against gravity a little and he was able to heelside in bed 25%. PF/DF: 5/5 MMT ?LLE Deficits / Details: PF/DF: 5/5 MMT. Pt was painfull and limited with all motions. He was able to bend his knee to 90 degrees sitting EOB and bear wt through it during [place] transfer. ?LLE: Unable to fully assess due to pain ?LLE Sensation: WNL ?  ? ?   ?Communication  ? Communication: No difficulties  ?Cognition Arousal/Alertness: Lethargic ?Behavior During Therapy: Flat affect ?Overall Cognitive Status: Within Functional Limits for tasks assessed ?  ?  ?  ?  ?  ?  ?  ?  ?  ?  ?  ?  ?  ?  ?  ?  ?  ?  ?  ? ?  ?General Comments General comments (skin integrity, edema, etc.): Pt's wife was present during session and was able to explain home situation and assistance level. ? ?  ?Exercises    ? ?Assessment/Plan  ?  ?PT Assessment Patient needs  continued PT services  ?PT Problem List Decreased strength;Decreased mobility;Decreased range of motion;Decreased coordination;Decreased knowledge of precautions;Decreased activity tolerance;Decreased balance;Decreased knowledge of use of DME ? ?   ?  ?PT Treatment Interventions DME instruction;Gait training;Stair training;Functional mobility training;Cognitive remediation;Therapeutic activities;Patient/family education;Neuromuscular re-education;Balance training;Therapeutic exercise   ? ?PT Goals (Current goals can be found in the Care Plan section)  ?Acute Rehab PT Goals ?Patient Stated Goal: Maintain PLOF ?PT Goal Formulation: With patient/family ?Time For Goal Achievement: 09/02/21 ?Potential to Achieve Goals: Fair ? ?  ?Frequency Min 5X/week ?  ? ? ?Co-evaluation   ?  ?  ?  ?  ? ? ?  ?AM-PAC PT "6 Clicks" Mobility  ?Outcome Measure Help needed turning from your back to your side while in a flat bed without using bedrails?: A Lot ?Help needed moving from lying on your back to sitting on the side of a flat bed without using bedrails?: A Lot ?Help needed moving to and from a bed to a chair (including a wheelchair)?: A Lot ?Help needed standing up from a chair using your arms (e.g., wheelchair or bedside chair)?: A Lot ?Help needed to walk in hospital room?: Total ?Help needed climbing 3-5 steps with a railing? : Total ?6 Click Score: 10 ? ?  ?End of Session Equipment Utilized During Treatment: Gait belt ?  Activity Tolerance: Patient tolerated treatment well ?Patient left: in chair;with call bell/phone within reach ?Nurse Communication: Mobility status;Need for lift equipment;Patient requests pain meds ?PT Visit Diagnosis: Other symptoms and signs involving the nervous system (R29.898);Pain;History of falling (Z91.81);Muscle weakness (generalized) (M62.81) ?Pain - Right/Left: Left ?Pain - part of body: Hip ?  ? ?Time: 0051-1021 ?PT Time Calculation (min) (ACUTE ONLY): 49 min ? ? ?Charges:   PT Evaluation ?$PT Eval  Moderate Complexity: 1 Mod ?PT Treatments ?$Therapeutic Activity: 8-22 mins ?  ?   ? ? ?Quenton Fetter, SPT ? ? ?Quenton Fetter ?08/19/2021, 5:14 PM ? ?

## 2021-08-19 NOTE — Progress Notes (Signed)
OT Cancellation Note ? ?Patient Details ?Name: [name] ?MRN: [id number] ?DOB: 06-24-1945 ? ? ?Cancelled Treatment:    Reason Eval/Treat Not Completed: Other (comment) (Eating meal) ? ?Joeseph Amor OTR/L  ?Acute Rehab Services  ?[phone] office number ?[phone] pager number  ?Joeseph Amor ?08/19/2021, 12:21 PM ?

## 2021-08-19 NOTE — TOC CAGE-AID Note (Signed)
Transition of Care (TOC) - CAGE-AID Screening ? ? ?Patient Details  ?Name: [name] ?MRN: [id number] ?Date of Birth: 1946/05/22 ? ?Transition of Care (TOC) CM/SW Contact:    ?Annison Birchard C Tarpley-Carter, LCSWA ?Phone Number: ?08/19/2021, 3:19 PM ? ? ?Clinical Narrative: ?Pt participated in [place].  Pt stated he does use substance (cigarettes) or ETOH  (occasionally) .  Pt was offered resources, due to usage of substance.   ? ?Passenger transport manager, MSW, LCSW-A ?Pronouns:  She/Her/Hers ?[facility] ?Clinical Social Worker ?Direct Number:  [phone] ?[email or web address] ? ?CAGE-AID Screening: ?  ? ?  ?Have People Annoyed You By Critizing Your Drinking Or Drug Use?: No ?Have You Felt Bad Or Guilty About Your Drinking Or Drug Use?: No ?Have You Ever Had a Drink or Used Drugs First Thing In The Morning to Steady Your Nerves or to Get Rid of a Hangover?: No ?  ? ?Substance Abuse Education Offered: No ? ?  ? ? ? ? ? ? ?

## 2021-08-19 NOTE — Progress Notes (Signed)
OT Cancellation Note ? ?Patient Details ?Name: [name] ?MRN: [id number] ?DOB: 26-Sep-1945 ? ? ?Cancelled Treatment:    Reason Eval/Treat Not Completed: Other (comment) (With Physical Therapy) Will follow up. ? ?Joeseph Amor OTR/L  ?Acute Rehab Services  ?[phone] office number ?[phone] pager number ? ? ?Joeseph Amor ?08/19/2021, 11:57 AM ?

## 2021-08-19 NOTE — Progress Notes (Signed)
FPTS Interim Night Progress Note ? ?S:Patient sleeping comfortably.  Rounded with primary night RN.  Reports has not voided post op.  Bladder scan for 78 and 74 cc. Pain well controlled with current medications.  No orders required.   ? ?O: ?Today's Vitals  ? 08/18/21 2126 08/18/21 2200 08/19/21 0311 08/19/21 0446  ?BP: 105/76  101/66   ?Pulse: 66  62   ?Resp: 10  12   ?Temp:      ?TempSrc:      ?SpO2: 96%  98%   ?Weight:      ?Height:      ?PainSc:  0-No pain  5   ? ? ? ? ?A/P: ?Continue current management ? ? ?Carollee Leitz MD ?PGY-3, [facility] ?Service pager [phone]   ?

## 2021-08-19 NOTE — Progress Notes (Signed)
FPTS Interim Night Progress Note ? ?S:Patient sleeping comfortably.  Rounded with primary night RN.  No concerns voiced.  No orders required.   ? ?O: ?Today's Vitals  ? 08/19/21 1538 08/19/21 2022 08/19/21 2122 08/19/21 2207  ?BP:  105/71    ?Pulse:  73    ?Resp:  12    ?Temp:  98.4 ?F (36.9 ?C)    ?TempSrc:  Oral    ?SpO2:  93%    ?Weight:      ?Height:      ?PainSc: 0-No pain  5  4   ? ? ? ? ?A/P: ?Continue current management ? ? ? ?Carollee Leitz MD ?PGY-3, [facility] ?Service pager [phone]   ?

## 2021-08-19 NOTE — Progress Notes (Signed)
Intact blister to lateral aspect of Left foot, non-adherent pad, and foam applied, pt denies pain at the site, will monitor closely  ? ? ?

## 2021-08-19 NOTE — Progress Notes (Signed)
Family Medicine Teaching Service ?Daily Progress Note ?Intern Pager: [phone] ? ?Patient name: [name] Medical record number: [id number] ?Date of birth: 1945-08-12 Age: 76 y.o. Gender: male ? ?Primary Care Provider: [name], MD ?Consultants: Orthopedics ?Code Status: DNR ? ?Pt Overview and Major Events to Date:  ?3/11: admitted for left femoral fracture ?3/13: Left total hip arthroplasty ? ?Assessment and Plan: ?[name] is a 76 year old male presenting with recurrent falls and consequent left femoral fracture now s/p left total hip arthroplasty.  Past medical history significant for progressive supranuclear palsy, paroxysmal A-fib on Coumadin, CAD s/p stent (2017), MI (2007), CVA x2, COPD, GERD, hypertension, hyperlipidemia, BPH. ? ?Left femoral neck fracture s/p left total hip arthroplasty ?Patient received left total hip arthroplasty on 3/13.  Hemoglobin changed from 12.9 to 11.3.  Pain stable with limited use of as needed medications.  Post XR noted left hip replacement without visible complicating features. ?-PT/OT eval and treat, appreciate assistance ?-Per Ortho: Tylenol every 6 hours as needed for mild pain, Norco 5-3 25 every 4 hours as needed for moderate pain, Norco 7.5-325 every 4 hours as needed for severe pain, Morphine 0.5-1 every 2 hours as needed for severe pain ?-Coumadin restart this afternoon, per pharmacy ?-A.m. CBC, PT/INR ? ?Constipation  poor urine output ?Patient is still not stooled throughout hospitalization.  Abdomen does feel tense but does not bother him. Reportedly has not had urine output since surgery despite receiving continuous fluids and bladder scans note 25m in bladder. Will monitor. ?-MiraLAX '34mg'$  twice daily, senna twice daily ?-Strict I/Os ?-continue NS '@75mL'$ /hr ? ?FEN/GI: Heart healthy ?PPx: Lovenox ?Dispo:Pending PT recommendations  ? ?Subjective:  ?Patient states his pain is well controlled and he does not have any pain at baseline just sitting there.   He is wanting to know if he can eat and is wanting to know if therapy is going to start soon. ? ?Objective: ?Temp:  [97.6 ?F (36.4 ?C)-100.1 ?F (37.8 ?C)] 98 ?F (36.7 ?C) (03/13 2050) ?Pulse Rate:  [62-74] 62 (03/14 0311) ?Resp:  [10-19] 12 (03/14 0311) ?BP: (101-139)/(66-97) 101/66 (03/14 0311) ?SpO2:  [92 %-98 %] 98 % (03/14 0311) ?Physical Exam: ?General: Awake, alert, chaplain at bedside with patient, NAD ?Cardiovascular: RRR, no murmurs auscultated ?Respiratory: CTA B, no increased work of breathing ?Abdomen: Tense abdomen, no guarding, nontender, normoactive bowel sounds ?Extremities: Left hip with dry bandage and bulb for blood drainage, minimal blood noted in drain ? ?Laboratory: ?Recent Labs  ?Lab 08/17/21 ?0654603/13/23 ?0503503/14/23 ?0228  ?WBC 9.6 8.8 10.4  ?HGB 13.4 12.9* 11.3*  ?HCT 39.8 39.2 34.0*  ?PLT 173 156 145*  ? ?Recent Labs  ?Lab 08/15/21 ?2045 08/17/21 ?0465603/13/23 ?0812703/14/23 ?0228  ?NA 139 137 138 139  ?K 3.9 4.2 4.2 4.2  ?CL 104 103 103 106  ?CO2 '25 25 29 25  '$ ?BUN '11 19 15 21  '$ ?CREATININE 1.05 1.31* 1.08 1.20  ?CALCIUM 8.9 8.8* 8.8* 8.3*  ?PROT 6.8  --   --   --   ?BILITOT 0.4  --   --   --   ?ALKPHOS 114  --   --   --   ?ALT 55*  --   --   --   ?AST 45*  --   --   --   ?GLUCOSE 107* 131* 131* 189*  ? ?Imaging/Diagnostic Tests: ?Pelvis Portable ? ?Result Date: 08/18/2021 ?CLINICAL DATA:  Postoperative left hip EXAM: PORTABLE PELVIS 1-2 VIEWS COMPARISON:  08/15/2021 FINDINGS: Interval postoperative changes  with placement of a left total hip arthroplasty using non cemented components. Components appear well seated. No acute fracture or dislocation identified. Cerclage wire over the inter trochanteric region. Skin clips and surgical drain as well as soft tissue gas are consistent with history of recent surgery. Degenerative changes in the right hip. Vascular calcifications. IMPRESSION: Interval left hip arthroplasty. Components appear well seated. Expected postoperative changes.  Electronically Signed   By: Lucienne Capers M.D.   On: 08/18/2021 20:44  ? ?DG C-Arm 1-60 Min-No Report ? ?Result Date: 08/18/2021 ?Fluoroscopy was utilized by the requesting physician.  No radiographic interpretation.  ? ?DG C-Arm 1-60 Min-No Report ? ?Result Date: 08/18/2021 ?Fluoroscopy was utilized by the requesting physician.  No radiographic interpretation.  ? ?DG HIP UNILAT WITH PELVIS 1V LEFT ? ?Result Date: 08/18/2021 ?CLINICAL DATA:  Left anterior total hip replacement EXAM: DG HIP (WITH OR WITHOUT PELVIS) 1V*L* COMPARISON:  08/15/2021 FINDINGS: 2 intraoperative spot images demonstrate changes of left hip replacement. Normal AP alignment. No hardware or bony complicating feature. IMPRESSION: Left hip replacement.  No visible complicating feature. Electronically Signed   By: Rolm Baptise M.D.   On: 08/18/2021 20:08   ? ? ?Wells Guiles, DO ?08/19/2021, 7:26 AM ?PGY-1, [facility] ?[place] Intern pager: [phone], text pages welcome ? ?

## 2021-08-19 NOTE — Progress Notes (Addendum)
ANTICOAGULATION CONSULT NOTE ? ?Pharmacy Consult for warfarin ?Indication: atrial fibrillation ? ?Allergies  ?Allergen Reactions  ? Bactrim [Sulfamethoxazole-Trimethoprim] Anaphylaxis  ?  Ulcers  ? Ciprofloxacin Swelling  ?  Tongue  ? Crestor [Rosuvastatin Calcium] Other (See Comments)  ?  Severe Myalgias  ? ? ?Patient Measurements: ?Height: 6' (182.9 cm) ?Weight: 81 kg (178 lb 9.2 oz) ?IBW/kg (Calculated) : 77.6 ?Heparin Dosing Weight: 81 kg ? ?Vital Signs: ?Temp: 98 ?F (36.7 ?C) (03/13 2050) ?BP: 101/66 (03/14 0311) ?Pulse Rate: 62 (03/14 0311) ? ?Labs: ?Recent Labs  ?  08/17/21 ?6160 08/18/21 ?7371 08/19/21 ?0228  ?HGB 13.4 12.9* 11.3*  ?HCT 39.8 39.2 34.0*  ?PLT 173 156 145*  ?LABPROT 24.6* 18.5* 16.4*  ?INR 2.2* 1.5* 1.3*  ?CREATININE 1.31* 1.08 1.20  ? ? ? ?Estimated Creatinine Clearance: 58.4 mL/min (by C-G formula based on SCr of 1.2 mg/dL). ? ? ?Medical History: ?Past Medical History:  ?Diagnosis Date  ? BPH (benign prostatic hyperplasia)   ? COPD (chronic obstructive pulmonary disease) ([place]) 2021  ? Coronary artery disease   ? a. s/p PCI to LCx in 2007; b. MV 2017 no sig ischemia, EF 59%, nl study. c. Cath 01/2020 without significant recurrent dz.  ? Depression   ? GERD (gastroesophageal reflux disease)   ? Hyperlipidemia   ? Hypertension   ? IBS (irritable bowel syndrome)   ? Myocardial infarction [facility])   ? 12-13 yrs ago   ? PAF (paroxysmal atrial fibrillation) ([facility] [facility])   ? a. CHADS2VASc => 5 (HTN, age x 1, stroke x 2, vascular disease); b. on Coumadin   ? PNA (pneumonia)   ? Skin cancer   ? face   ? Sleep apnea   ? off cpap   ? Stroke [facility])   ? a. x 2 with residual left-sided weakness  ?  ? ? ?Assessment: ?55 yom who admitted after fall resulting in left femoral neck fracture. He is on warfarin PTA for PAF. Pharmacy consulted to begin IV heparin when INR < 2 in preparation for surgical repair. ? ?Patient is now s/p left total hip arthroplasty. Discussed anticoagulation plan with Dr. Lyla Glassing who would like  patient to switch from heparin to enoxaparin; patient can receive prophylactic dose enoxaparin 3/14 AM and treatment dose starting 3/14 PM. Warfarin to start POD 0 per consult.  ? ?Slight Hgb drop post-op 12.9>>11.3. INR Subtherapeutic at 1.3 s/p 3 mg dose. No signs of bleeding noted per RN.  ? ?PTA warfarin 7.'5mg'$  on Sun/Thurs, 3.'75mg'$  all other days  ? ?Goal of Therapy:  ?Heparin level 0.3-0.7 units/ml ?Monitor platelets by anticoagulation protocol: Yes ?  ?Plan:  ?Warfarin 5 mg x1  ?Enoxaparin '40mg'$  x1 3/14 AM ?Increase to enoxaparin '80mg'$  q12 hr starting 3/14 PM  ?Monitor for signs/symptoms of bleeding  ? ?Adria Dill, PharmD ?PGY-1 Acute Care Resident  ?08/19/2021 6:22 AM  ? ? ?

## 2021-08-19 NOTE — Anesthesia Postprocedure Evaluation (Signed)
Anesthesia Post Note ? ?Patient: [name] ? ?Procedure(s) Performed: TOTAL HIP ARTHROPLASTY ANTERIOR APPROACH (Left: Hip) ? ?  ? ?Patient location during evaluation: PACU ?Anesthesia Type: General ?Level of consciousness: awake and alert ?Pain management: pain level controlled ?Vital Signs Assessment: post-procedure vital signs reviewed and stable ?Respiratory status: spontaneous breathing, nonlabored ventilation, respiratory function stable and patient connected to nasal cannula oxygen ?Cardiovascular status: blood pressure returned to baseline and stable ?Postop Assessment: no apparent nausea or vomiting ?Anesthetic complications: no ? ? ?No notable events documented. ? ?Last Vitals:  ?Vitals:  ? 08/18/21 2050 08/18/21 2126  ?BP: 124/77 105/76  ?Pulse: 65 66  ?Resp: 12 10  ?Temp: 36.7 ?C   ?SpO2: 94% 96%  ?  ?Last Pain:  ?Vitals:  ? 08/18/21 2200  ?TempSrc:   ?PainSc: 0-No pain  ? ? ?  ?  ?  ?  ?  ?  ? ?[place] ? ? ? ? ?

## 2021-08-20 ENCOUNTER — Inpatient Hospital Stay (HOSPITAL_COMMUNITY): Payer: PPO

## 2021-08-20 ENCOUNTER — Encounter (HOSPITAL_COMMUNITY): Payer: Self-pay | Admitting: Orthopedic Surgery

## 2021-08-20 ENCOUNTER — Ambulatory Visit: Payer: PPO | Admitting: Physical Therapy

## 2021-08-20 DIAGNOSIS — N179 Acute kidney failure, unspecified: Secondary | ICD-10-CM

## 2021-08-20 DIAGNOSIS — S72002D Fracture of unspecified part of neck of left femur, subsequent encounter for closed fracture with routine healing: Secondary | ICD-10-CM | POA: Diagnosis not present

## 2021-08-20 DIAGNOSIS — K5900 Constipation, unspecified: Secondary | ICD-10-CM | POA: Diagnosis not present

## 2021-08-20 LAB — URINALYSIS, ROUTINE W REFLEX MICROSCOPIC
Bilirubin Urine: NEGATIVE
Glucose, UA: NEGATIVE mg/dL
Ketones, ur: 5 mg/dL — AB
Leukocytes,Ua: NEGATIVE
Nitrite: NEGATIVE
Protein, ur: NEGATIVE mg/dL
Specific Gravity, Urine: 1.024 (ref 1.005–1.030)
pH: 5 (ref 5.0–8.0)

## 2021-08-20 LAB — OSMOLALITY, URINE: Osmolality, Ur: 626 mOsm/kg (ref 300–900)

## 2021-08-20 LAB — BASIC METABOLIC PANEL
Anion gap: 13 (ref 5–15)
Anion gap: 11 (ref 5–15)
BUN: 48 mg/dL — ABNORMAL HIGH (ref 8–23)
BUN: 48 mg/dL — ABNORMAL HIGH (ref 8–23)
CO2: 22 mmol/L (ref 22–32)
CO2: 20 mmol/L — ABNORMAL LOW (ref 22–32)
Calcium: 8.2 mg/dL — ABNORMAL LOW (ref 8.9–10.3)
Calcium: 8.2 mg/dL — ABNORMAL LOW (ref 8.9–10.3)
Chloride: 100 mmol/L (ref 98–111)
Chloride: 102 mmol/L (ref 98–111)
Creatinine, Ser: 2.45 mg/dL — ABNORMAL HIGH (ref 0.61–1.24)
Creatinine, Ser: 2.05 mg/dL — ABNORMAL HIGH (ref 0.61–1.24)
GFR, Estimated: 27 mL/min — ABNORMAL LOW (ref >60)
GFR, Estimated: 33 mL/min — ABNORMAL LOW (ref >60)
Glucose, Bld: 131 mg/dL — ABNORMAL HIGH (ref 70–99)
Glucose, Bld: 110 mg/dL — ABNORMAL HIGH (ref 70–99)
Potassium: 4.1 mmol/L (ref 3.5–5.1)
Potassium: 4.1 mmol/L (ref 3.5–5.1)
Sodium: 135 mmol/L (ref 135–145)
Sodium: 133 mmol/L — ABNORMAL LOW (ref 135–145)

## 2021-08-20 LAB — CBC
HCT: 30.4 % — ABNORMAL LOW (ref 39.0–52.0)
Hemoglobin: 10.3 g/dL — ABNORMAL LOW (ref 13.0–17.0)
MCH: 31.4 pg (ref 26.0–34.0)
MCHC: 33.9 g/dL (ref 30.0–36.0)
MCV: 92.7 fL (ref 80.0–100.0)
Platelets: 213 10*3/uL (ref 150–400)
RBC: 3.28 MIL/uL — ABNORMAL LOW (ref 4.22–5.81)
RDW: 13.7 % (ref 11.5–15.5)
WBC: 16.2 10*3/uL — ABNORMAL HIGH (ref 4.0–10.5)
nRBC: 0 % (ref 0.0–0.2)

## 2021-08-20 LAB — BLOOD GAS, VENOUS
Acid-Base Excess: 1.4 mmol/L (ref 0.0–2.0)
Bicarbonate: 24.9 mmol/L (ref 20.0–28.0)
Drawn by: 270221
O2 Saturation: 78.7 %
Patient temperature: 36.9
pCO2, Ven: 35 mmHg — ABNORMAL LOW (ref 44–60)
pH, Ven: 7.46 — ABNORMAL HIGH (ref 7.25–7.43)
pO2, Ven: 43 mmHg (ref 32–45)

## 2021-08-20 LAB — SODIUM, URINE, RANDOM: Sodium, Ur: 15 mmol/L

## 2021-08-20 LAB — PROTIME-INR
INR: 1.6 — ABNORMAL HIGH (ref 0.8–1.2)
Prothrombin Time: 19.3 seconds — ABNORMAL HIGH (ref 11.4–15.2)

## 2021-08-20 MED ORDER — MILK AND MOLASSES ENEMA
1.0000 | Freq: Once | RECTAL | Status: DC
  Filled 2021-08-20: qty 240

## 2021-08-20 MED ORDER — ENOXAPARIN SODIUM 80 MG/0.8ML IJ SOSY
80.0000 mg | PREFILLED_SYRINGE | INTRAMUSCULAR | Status: DC
  Administered 2021-08-20: 80 mg via SUBCUTANEOUS
  Filled 2021-08-20: qty 0.8

## 2021-08-20 MED ORDER — LACTULOSE 10 GM/15ML PO SOLN
20.0000 g | Freq: Two times a day (BID) | ORAL | Status: DC
  Administered 2021-08-20 (×2): 20 g via ORAL
  Filled 2021-08-20 (×2): qty 30

## 2021-08-20 MED ORDER — LACTATED RINGERS IV BOLUS
1000.0000 mL | Freq: Once | INTRAVENOUS | Status: AC
  Administered 2021-08-20: 1000 mL via INTRAVENOUS

## 2021-08-20 MED ORDER — ALBUTEROL SULFATE (2.5 MG/3ML) 0.083% IN NEBU
2.5000 mg | INHALATION_SOLUTION | RESPIRATORY_TRACT | Status: DC | PRN
  Filled 2021-08-20: qty 3

## 2021-08-20 NOTE — Progress Notes (Signed)
Pt seen in room, with wife at bedside. Pt c/o SOB. Though sat at 95 room air VSS. Marland Kitchendiminished /wheezing sound noted. [place]  provided at 2l/min sat 99%. Increased confusion noted.  Family med team made aware of above, RT informed., and charge nurse aware.  ?

## 2021-08-20 NOTE — Progress Notes (Addendum)
Physical Therapy Treatment ?Patient Details ?Name: [name] ?MRN: [id number] ?DOB: [birth year] ?Today's Date: 08/20/2021 ? ? ?History of Present Illness Pt is a 76 yr old had a witness fall on 3/10  a comminuted displaced vertical basicervical left femoral neck fracture and s/p L THA with an anterior approach. PMH: efib, COPD, GERD, HLD, CAD s/p stent CVA x2  with L side weakness, progressive supranuclear palsy, IBS, MI, PNA ? ?  ?PT Comments  ? ? Pt was delirious during today's session. He was limited by pain with moving and needed a lot of encouragement to move. He was max to total assist +2 to get to EOB and reported pain with all movements. Therapeutic exercises were attempted but pt did not participate. Bed pad was changed due to drainage from wound and pt was able to participate in rolling. Education was provided on the importance of movement for pt in acute setting to pt and his wife. During treatment session patient showed deficits in strength, endurance, and activity tolerance. Pt did not meet the requirements for acute inpatient rehab. Changing recommendation for therapy services to skilled nursing facility to address the previously stated deficits. Will continue to follow acutely to maximize functional mobility, independence and safety. ?  ?Recommendations for follow up therapy are one component of a multi-disciplinary discharge planning process, led by the attending physician.  Recommendations may be updated based on patient status, additional functional criteria and insurance authorization. ? ?Follow Up Recommendations ? Skilled nursing-short term rehab (<3 hours/day) ?  ?  ?Assistance Recommended at Discharge Frequent or constant Supervision/Assistance  ?Patient can return home with the following Two people to help with walking and/or transfers;Two people to help with bathing/dressing/bathroom;Direct supervision/assist for medications management;Help with stairs or ramp for entrance;Assist for  transportation;Assistance with cooking/housework;Direct supervision/assist for financial management;Assistance with feeding ?  ?Equipment Recommendations ? None recommended by PT  ?  ?Recommendations for Other Services   ? ? ?  ?Precautions / Restrictions Precautions ?Precautions: Anterior Hip;Fall ?Restrictions ?Weight Bearing Restrictions: Yes ?LLE Weight Bearing: Weight bearing as tolerated  ?  ? ?Mobility ? Bed Mobility ?Overal bed mobility: Needs Assistance ?Bed Mobility: Supine to Sit ?Rolling: Max assist, +2 for physical assistance ?  ?Supine to sit: +2 for physical assistance, Total assist ?Sit to supine: +2 for physical assistance, Max assist ?  ?General bed mobility comments: Helicopter technique was used to sit pt up EOB. Pt was able to participate in scooting closer to the EOB but was limited by pain. ?  ? ?Transfers ?  ?  ?  ?  ?  ?  ?  ?  ?  ?General transfer comment: Pt did not transfer today due to safety concern. ?  ? ?Ambulation/Gait ?  ?  ?  ?  ?  ?  ?  ?  ? ? ?Stairs ?  ?  ?  ?  ?  ? ? ?Wheelchair Mobility ?  ? ?Modified Rankin (Stroke Patients Only) ?  ? ? ?  ?Balance Overall balance assessment: Needs assistance ?Sitting-balance support: Feet supported, Single extremity supported ?Sitting balance-Leahy Scale: Poor ?Sitting balance - Comments: Sitting HOB pt was pushing hard to the R to off load his painful hip. ?Postural control: Right lateral lean ?  ?  ?Standing balance comment: Pt did not stand today. ?  ?  ?  ?  ?  ?  ?  ?  ?  ?  ?  ?  ? ?  ?Cognition Arousal/Alertness: Lethargic ?Behavior During Therapy:  Anxious, Flat affect, Restless ?Overall Cognitive Status: Impaired/Different from baseline ?Area of Impairment: Attention, Memory, Following commands ?  ?  ?  ?  ?  ?  ?  ?  ?  ?Current Attention Level: Sustained ?Memory: Decreased short-term memory ?Following Commands: Follows one step commands inconsistently, Follows one step commands with increased time ?  ?  ?  ?General Comments: Pt was  delerious and perseverating on pain. When questioned aspects of the previous session pt reported that he did not recall physical therapy coming to see him and did not recall sitting in the recliner. He was distracted by his lines and leads throughout the session. ?  ?  ? ?  ?Exercises   ? ?  ?General Comments General comments (skin integrity, edema, etc.): Pt's spouse educated on importance of mobilization in conjunction with pain mgmt, educated on mobility to prevent edema and joint stiffness from occuring ?  ?  ? ?Pertinent Vitals/Pain Pain Assessment ?Pain Assessment: Faces ?Faces Pain Scale: Hurts whole lot ?Pain Location: pain at the operative site, pain with any movement of the L LE, pain with touching his L foot and L knee, ?Pain Descriptors / Indicators: Operative site guarding  ? ? ?Home Living Family/patient expects to be discharged to:: Private residence ?Living Arrangements: Spouse/significant other ?Available Help at Discharge: Family;Available 24 hours/day ?Type of Home: House ?Home Access: Level entry ?  ?  ?  ?Home Layout: Two level;Able to live on main level with bedroom/bathroom ?Home Equipment: Rolling Walker (2 wheels);Grab bars - toilet;Grab bars - tub/shower;Cane - single point ?   ?  ?Prior Function    ?  ?  ?   ? ?PT Goals (current goals can now be found in the care plan section)   ? ?  ?Frequency ? ? ? Min 5X/week ? ? ? ?  ?PT Plan Discharge plan needs to be updated  ? ? ?Co-evaluation   ?  ?  ?  ?  ? ?  ?AM-PAC PT "6 Clicks" Mobility   ?Outcome Measure ? Help needed turning from your back to your side while in a flat bed without using bedrails?: A Lot ?Help needed moving from lying on your back to sitting on the side of a flat bed without using bedrails?: A Lot ?Help needed moving to and from a bed to a chair (including a wheelchair)?: Total ?Help needed standing up from a chair using your arms (e.g., wheelchair or bedside chair)?: Total ?Help needed to walk in hospital room?: Total ?Help  needed climbing 3-5 steps with a railing? : Total ?6 Click Score: 8 ? ?  ?End of Session Equipment Utilized During Treatment: Gait belt ?  ?Patient left: with call bell/phone within reach;in bed ?Nurse Communication: Mobility status;Patient requests pain meds ?PT Visit Diagnosis: Other symptoms and signs involving the nervous system (R29.898);Pain;History of falling (Z91.81);Muscle weakness (generalized) (M62.81) ?Pain - Right/Left: Left ?Pain - part of body: Hip;Knee ?  ? ? ?Time: 0240-9735 ?PT Time Calculation (min) (ACUTE ONLY): 52 min ? ?Charges:  $Therapeutic Activity: 38-52 mins          ?          ? ?Quenton Fetter, SPT ? ? ? ?Quenton Fetter ?08/20/2021, 3:44 PM ? ?

## 2021-08-20 NOTE — Progress Notes (Signed)
FPTS Interim Progress Note Late Entry ? ?S: Paged by RN that patient was delirious and shortness of breath.  Wife reports pt refusing enema. He became acutely agitated.  She feels like he is not at his baseline. He was angry with his wife and spoke to her in a manner that he hasn't previously. This was concerning to her.  ? ?Pt reports pain in his leg. Denies shortness of breath.                  ? ?O: ?BP 118/72 (BP Location: Left Arm)   Pulse 76   Temp 98.3 ?F (36.8 ?C)   Resp 17   Ht 6' (1.829 m)   Wt 81 kg   SpO2 95%   BMI 24.22 kg/m?   ? ?GEN: alert, calm resting in bed  ?CVS: regular rate and rhythm, no JVP  ?RESP: occasional expiratory wheeze, no rales, no rhonchi  ?EXT: no LE edema  ?NERUO: alert and oriented to name, able to state DOB and wife name, confused on # of children he has  ? ?A/P: ? ?Wheezing  ?Obtain CXR. Give Albuterol Nebulizer's PRN  ? ?Constipation  ?KUB with mild to moderate stool burden. Discontinue enema. Start Lactulose 20 mg BID, deescalate when able.  ? ?Delirium  ?VBG unremarkable. Acute change possibly related to pt's refusal of enema. He settled down after  ? ?AKI ?Renal US showing 1.9 x 1.3 cm midpole cyst. Serum creatine improving with s/p 1L fluid bolus. Wife reporting decreased po intake. Continue mIVF.  ? Lyndee Hensen, DO ?08/20/2021, 2:18 PM ?PGY-3, [place] ?Service pager [phone] ? ?

## 2021-08-20 NOTE — Progress Notes (Signed)
OT Cancellation Note ? ?Patient Details ?Name: [name] ?MRN: [id number] ?DOB: 09/23/1945 ? ? ?Cancelled Treatment:    Reason Eval/Treat Not Completed: Patient at procedure or test/ unavailable (IV team and ultrasound in room, will reattempt as schedule allows.) ? ?Lynnda Child, OTD, OTR/L ?Acute Rehab ?[phone] ? ?[name] ?08/20/2021, 11:27 AM ?

## 2021-08-20 NOTE — Progress Notes (Signed)
Inpatient Rehab Admissions Coordinator:  ? ?Per therapy recommendations, patient was screened for CIR candidacy by Clemens Catholic, MS, CCC-SLP. At this time, Pt. does not appear to demonstrate medical necessity to justify in hospital rehabilitation/CIR. will not pursue a rehab consult for this Pt.  Pt.'s payor is also very unlikely to approve CIR for this dx.  Recommend other rehab venues to be pursued.  Please contact me with any questions. ? ?Clemens Catholic, MS, CCC-SLP ?Rehab Admissions Coordinator  ?[phone] (celll) ?[phone] (office) ? ?

## 2021-08-20 NOTE — Progress Notes (Signed)
ANTICOAGULATION CONSULT NOTE ? ?Pharmacy Consult for warfarin/enoxaparin  ?Indication: atrial fibrillation ? ?Allergies  ?Allergen Reactions  ? Bactrim [Sulfamethoxazole-Trimethoprim] Anaphylaxis  ?  Ulcers  ? Ciprofloxacin Swelling  ?  Tongue  ? Crestor [Rosuvastatin Calcium] Other (See Comments)  ?  Severe Myalgias  ? ? ?Patient Measurements: ?Height: 6' (182.9 cm) ?Weight: 81 kg (178 lb 9.2 oz) ?IBW/kg (Calculated) : 77.6 ?Heparin Dosing Weight: 81 kg ? ?Vital Signs: ?Temp: 98.3 ?F (36.8 ?C) (03/15 5176) ?BP: 118/72 (03/15 0846) ?Pulse Rate: 76 (03/15 0846) ? ?Labs: ?Recent Labs  ?  08/18/21 ?0427 08/19/21 ?0228 08/20/21 ?0236 08/20/21 ?0600  ?HGB 12.9* 11.3* 10.3*  --   ?HCT 39.2 34.0* 30.4*  --   ?PLT 156 145* 213  --   ?LABPROT 18.5* 16.4* 19.3*  --   ?INR 1.5* 1.3* 1.6*  --   ?CREATININE 1.08 1.20  --  2.45*  ? ? ? ?Estimated Creatinine Clearance: 28.6 mL/min (A) (by C-G formula based on SCr of 2.45 mg/dL (H)). ? ? ?Medical History: ?Past Medical History:  ?Diagnosis Date  ? BPH (benign prostatic hyperplasia)   ? COPD (chronic obstructive pulmonary disease) ([place]) 2021  ? Coronary artery disease   ? a. s/p PCI to LCx in 2007; b. MV 2017 no sig ischemia, EF 59%, nl study. c. Cath 01/2020 without significant recurrent dz.  ? Depression   ? GERD (gastroesophageal reflux disease)   ? Hyperlipidemia   ? Hypertension   ? IBS (irritable bowel syndrome)   ? Myocardial infarction [facility])   ? 12-13 yrs ago   ? PAF (paroxysmal atrial fibrillation) ([place])   ? a. CHADS2VASc => 5 (HTN, age x 1, stroke x 2, vascular disease); b. on Coumadin   ? PNA (pneumonia)   ? Skin cancer   ? face   ? Sleep apnea   ? off cpap   ? Stroke [facility])   ? a. x 2 with residual left-sided weakness  ?  ? ? ?Assessment: ?52 yom who admitted after fall resulting in left femoral neck fracture. He is on warfarin PTA for PAF.  ? ?Patient's Hgb continues to drop s/p left total hip arthroplasty12.9>>11.3>>10.3. Hgb drop is possibly due to hemodilution, however  PLT and WBC have increased in setting of Hgb decline. INR Subtherapeutic at 1.6 s/p 2 days of warfarin (3 mg x1, 5 mg x1). No signs of bleeding noted per RN. In light of progressive AKI, there is concern patient may need an intervention at some point in the near future. After discussion with FMTS, will hold warfarin and continue enoxaparin.  ? ?PTA warfarin 7.'5mg'$  on Sun/Thurs, 3.'75mg'$  all other days  ? ?Goal of Therapy:  ?Heparin level 0.3-0.7 units/ml ?Monitor platelets by anticoagulation protocol: Yes ?  ?Plan:  ?Enoxaparin '80mg'$  [place] Q24H ?HOLD Warfarin  ?Monitor for signs/symptoms of bleeding, renal function ?F/U warfarin restart   ? ?Adria Dill, PharmD ?PGY-1 Acute Care Resident  ?08/20/2021 1:33 PM  ? ? ?

## 2021-08-20 NOTE — Progress Notes (Signed)
Family Medicine Teaching Service ?Daily Progress Note ?Intern Pager: [phone] ? ?Patient name: [name] Medical record number: [id number] ?Date of birth: 04/29/1946 Age: 76 y.o. Gender: male ? ?Primary Care Provider: [name], MD ?Consultants: Orthopedics ?Code Status: DNR ? ?Pt Overview and Major Events to Date:  ?3/11: admitted for left femoral fracture ?3/13: Left total hip arthroplasty ? ?Assessment and Plan: ?[name] is a 76 year old male presenting with recurrent falls and consequent left femoral fracture now s/p left total hip arthroplasty.  Past medical history significant for progressive supranuclear palsy, paroxysmal A-fib on Coumadin, CAD s/p stent (2017), MI (2007), CVA x2, COPD, GERD, hypertension, hyperlipidemia, BPH. ? ?AKI  poor UOP ?Creatinine elevated from 1.2 to 2.45. Poor UOP of 671m. Likely prerenal in the setting of lack of intake and less than maintenance fluid received. Will consider consulting nephro depending on response. ?-Increase fluids to maintenance at 100 mL/hr ?-1 L LR bolus ?-Renal ultrasound ?-BMP at 1200 ?-Nephro consult depending on BMP ? ?Left femoral neck fracture s/p left total hip arthroplasty ?Pain well controlled and has only required Tylenol and Norco 7.5-325 in the last 24 hours. ?-PT/OT eval and treat, appreciate assistance ?-Per Ortho: Tylenol every 6 hours as needed for mild pain, Norco 5-3 25 every 4 hours as needed for moderate pain, Norco 7.5-325 every 4 hours as needed for severe pain, Morphine 0.5-1 every 2 hours as needed for severe pain ?-Coumadin restart this afternoon, per pharmacy ?-A.m. CBC, PT/INR ? ?Constipation  ?Received MiraLAX 34 mg x 2 and senna x2 yesterday.  Still without pain but abdomen very tense.  Patient is amenable to enema.  Do not suspect obstruction but likely impacted by opioids, lack of movement, less intake. ?-Continue MiraLAX 34 mg twice daily, senna twice daily ?-Milk and molasses enema ?-KUB ? ?Anemia ?Hemoglobin  decreased from 11.3 to 10.3.  Hold warfarin.  Will monitor. ?-A.m. CBC ? ?FEN/GI: Heart healthy ?PPx: Lovenox ?Dispo: AIR  pending clinical improvement . Barriers include AKI, constipation.  ? ?Subjective:  ?Pt denies any pain while sitting but there is pain with movement. He is working with PT but wants to know what the long term plan for therapy is. ? ?Objective: ?Temp:  [98 ?F (36.7 ?C)-98.4 ?F (36.9 ?C)] 98.4 ?F (36.9 ?C) (03/14 2022) ?Pulse Rate:  [65-73] 73 (03/14 2022) ?Resp:  [10-12] 12 (03/14 2022) ?BP: (98-105)/(66-71) 105/71 (03/14 2022) ?SpO2:  [93 %-95 %] 93 % (03/14 2022) ?Physical Exam: ?General: awake, alert, disoriented to time ?Cardiovascular: RRR, no murmurs auscultated ?Respiratory: CTAB, no increased WOB ?Abdomen: tense, distended, nontender, normoactive bowel sounds ?Extremities: surgical site without erythema or infectious signs, no bruising or hematoma appreciated ? ?Laboratory: ?Recent Labs  ?Lab 08/18/21 ?0427 08/19/21 ?0228 08/20/21 ?0236  ?WBC 8.8 10.4 16.2*  ?HGB 12.9* 11.3* 10.3*  ?HCT 39.2 34.0* 30.4*  ?PLT 156 145* 213  ? ?Recent Labs  ?Lab 08/15/21 ?2045 08/17/21 ?0050 08/18/21 ?0427 08/19/21 ?0228 08/20/21 ?0600  ?NA 139   < > 138 139 135  ?K 3.9   < > 4.2 4.2 4.1  ?CL 104   < > 103 106 100  ?CO2 25   < > '29 25 22  '$ ?BUN 11   < > 15 21 48*  ?CREATININE 1.05   < > 1.08 1.20 2.45*  ?CALCIUM 8.9   < > 8.8* 8.3* 8.2*  ?PROT 6.8  --   --   --   --   ?BILITOT 0.4  --   --   --   --   ?  ALKPHOS 114  --   --   --   --   ?ALT 55*  --   --   --   --   ?AST 45*  --   --   --   --   ?GLUCOSE 107*   < > 131* 189* 131*  ? < > = values in this interval not displayed.  ? ?INR: 1.6 ? ?Imaging/Diagnostic Tests: ?No results found.  ? ?Wells Guiles, DO ?08/20/2021, 7:35 AM ?PGY-1, [facility] ?[place] Intern pager: [phone], text pages welcome ? ?

## 2021-08-20 NOTE — Evaluation (Addendum)
Occupational Therapy Evaluation ?Patient Details ?Name: [name] ?MRN: [id number] ?DOB: [birth year] ?Today's Date: 08/20/2021 ? ? ?History of Present Illness Pt is a 76 yr old had a witness fall on 3/10  a comminuted displaced vertical basicervical left femoral neck fracture and s/p L THA with an anterior approach. PMH: efib, COPD, GERD, HLD, CAD s/p stent CVA x2  with L side weakness, progressive supranuclear palsy, IBS, MI, PNA  ? ?Clinical Impression ?  ?Pt seen for OT eval shortly after PT, wife reporting pt needs assistance at baseline with ADLs and functional mobility, per wife, pt transfers mostly, and walks short distances with RW and min guard - min A. Spouse also assists pt with dressing, toileting, states he is able to bathe self once seated in shower.  ? ?Currently, pt max-total A +2 for ADLs and bed mobility. A & O x2. Session limited due to pt pain, confusion, SOB, and staff in room to perform x-ray. RN aware. Wife very hesitant with pt moving due to pain, states frustration with therapy/staff moving pt when he is in pain. Educated pt/spouse on importance of acute mobility, verbalized understanding however may need continued reminders. Pt presenting with impairments listed below, will follow acutely. Pt's wife declining SNF, recommending HHOT at d/c. ?   ? ?Recommendations for follow up therapy are one component of a multi-disciplinary discharge planning process, led by the attending physician.  Recommendations may be updated based on patient status, additional functional criteria and insurance authorization.  ? ?Follow Up Recommendations ? Home health OT (pt's wife refusing SNF, wanting to take him home)  ?  ?Assistance Recommended at Discharge Frequent or constant Supervision/Assistance  ?Patient can return home with the following Two people to help with bathing/dressing/bathroom;Two people to help with walking and/or transfers;Assistance with feeding;Direct supervision/assist for medications  management;Direct supervision/assist for financial management;Assist for transportation;Help with stairs or ramp for entrance;Assistance with cooking/housework ? ?  ?Functional Status Assessment ? Patient has had a recent decline in their functional status and demonstrates the ability to make significant improvements in function in a reasonable and predictable amount of time.  ?Equipment Recommendations ? None recommended by OT;Other (comment) (pt has all needed DME)  ?  ?Recommendations for Other Services   ? ? ?  ?Precautions / Restrictions Precautions ?Precautions: Anterior Hip;Fall ?Restrictions ?Weight Bearing Restrictions: Yes ?LLE Weight Bearing: Weight bearing as tolerated  ? ?  ? ?Mobility Bed Mobility ?Overal bed mobility: Needs Assistance ?Bed Mobility: Rolling ?Rolling: Max assist, Total assist, +2 for physical assistance ?  ?  ?  ?  ?General bed mobility comments: assists minimally by reaching/grasping bedrails ?  ? ?Transfers ?  ?  ?  ?  ?  ?  ?  ?  ?  ?General transfer comment: deferred ?  ? ?  ?Balance   ?  ?  ?  ?  ?  ?  ?Standing balance comment: deferred sitting EOB due to pending CXR, wife's concerns and SOB ?  ?  ?  ?  ?  ?  ?  ?  ?  ?  ?  ?   ? ?ADL either performed or assessed with clinical judgement  ? ?ADL Overall ADL's : Needs assistance/impaired ?Eating/Feeding: Minimal assistance;Bed level ?  ?Grooming: Moderate assistance;Bed level ?  ?Upper Body Bathing: Maximal assistance;Bed level ?  ?Lower Body Bathing: Maximal assistance;Bed level ?  ?Upper Body Dressing : Maximal assistance;Bed level ?  ?Lower Body Dressing: Maximal assistance;Bed level ?  ?Toilet Transfer: Total assistance;+2  for physical assistance ?  ?Toileting- Clothing Manipulation and Hygiene: Total assistance;+2 for physical assistance ?  ?  ?  ?Functional mobility during ADLs: Maximal assistance;Total assistance;+2 for physical assistance ?   ? ? ? ?Vision   ?Vision Assessment?: No apparent visual deficits  ?   ?Perception    ?  ?Praxis   ?  ? ?Pertinent Vitals/Pain Pain Assessment ?Pain Assessment: Faces ?Pain Score: 5  ?Faces Pain Scale: Hurts little more ?Pain Location: pain at the operative site, pain with any movement of the L LE, pain with touching his L foot and L knee, ?Pain Descriptors / Indicators: Operative site guarding ?Pain Intervention(s): Limited activity within patient's tolerance, Monitored during session, Premedicated before session  ? ? ? ?Hand Dominance Right ?  ?Extremity/Trunk Assessment Upper Extremity Assessment ?Upper Extremity Assessment: Generalized weakness;LUE deficits/detail ?LUE Deficits / Details: 2+/5 ROM of shoulder, elbow, weak grasp/grip strength ?LUE Coordination: decreased fine motor;decreased gross motor ?  ?Lower Extremity Assessment ?Lower Extremity Assessment: Defer to PT evaluation ?  ?  ?  ?Communication Communication ?Communication: No difficulties ?  ?Cognition Arousal/Alertness: Lethargic ?Behavior During Therapy: Anxious, Flat affect, Restless ?Overall Cognitive Status: Impaired/Different from baseline ?Area of Impairment: Attention, Memory, Following commands ?  ?  ?  ?  ?  ?  ?  ?  ?  ?Current Attention Level: Sustained ?Memory: Decreased short-term memory ?Following Commands: Follows one step commands inconsistently, Follows one step commands with increased time ?  ?  ?  ?General Comments: pt oriented to self only, knowing it is march, however thinking it is 1970's and he is at home ?  ?  ?General Comments  Pt's spouse educated on importance of mobilization in conjunction with pain mgmt, educated on mobility to prevent edema and joint stiffness from occuring ? ?  ?Exercises   ?  ?Shoulder Instructions    ? ? ?Home Living Family/patient expects to be discharged to:: Private residence ?Living Arrangements: Spouse/significant other ?Available Help at Discharge: Family;Available 24 hours/day ?Type of Home: House ?Home Access: Level entry ?  ?  ?Home Layout: Two level;Able to live on main  level with bedroom/bathroom ?  ?  ?Bathroom Shower/Tub: Walk-in shower ?  ?Bathroom Toilet: Standard ?Bathroom Accessibility: Yes ?  ?Home Equipment: Rolling Walker (2 wheels);Grab bars - toilet;Grab bars - tub/shower;Cane - single point ?  ?  ?  ? ?  ?Prior Functioning/Environment Prior Level of Function : Needs assist ?  ?  ?  ?Physical Assist : Mobility (physical);ADLs (physical) ?Mobility (physical): Transfers;Gait ?ADLs (physical): Bathing;Dressing;Toileting ?Mobility Comments: Pt's wife assists with mobility needs ?ADLs Comments: Pt's wife assists with dressing, reports pt can bathe self once seated in shower ?  ? ?  ?  ?OT Problem List: Decreased strength;Decreased range of motion;Impaired balance (sitting and/or standing);Decreased activity tolerance;Decreased coordination;Decreased cognition;Decreased safety awareness;Decreased knowledge of precautions;Decreased knowledge of use of DME or AE;Cardiopulmonary status limiting activity;Impaired UE functional use;Pain ?  ?   ?OT Treatment/Interventions: Self-care/ADL training;Therapeutic exercise;DME and/or AE instruction;Energy conservation;Therapeutic activities;Cognitive remediation/compensation;Patient/family education;Balance training  ?  ?OT Goals(Current goals can be found in the care plan section) Acute Rehab OT Goals ?Patient Stated Goal: none stated ?OT Goal Formulation: With patient ?Time For Goal Achievement: 09/03/21 ?Potential to Achieve Goals: Fair  ?OT Frequency: Min 3X/week ?  ? ?Co-evaluation   ?  ?  ?  ?  ? ?  ?AM-PAC OT "6 Clicks" Daily Activity     ?Outcome Measure Help from another person eating meals?: A Little ?Help  from another person taking care of personal grooming?: A Lot ?Help from another person toileting, which includes using toliet, bedpan, or urinal?: Total ?Help from another person bathing (including washing, rinsing, drying)?: Total ?Help from another person to put on and taking off regular upper body clothing?: Total ?  ?6  Click Score: 8 ?  ?End of Session Nurse Communication: Mobility status ? ?Activity Tolerance: Patient limited by fatigue;Patient limited by pain ?Patient left: in bed;with call bell/phone within reach;with f

## 2021-08-21 ENCOUNTER — Other Ambulatory Visit (HOSPITAL_COMMUNITY): Payer: Self-pay

## 2021-08-21 DIAGNOSIS — S72002D Fracture of unspecified part of neck of left femur, subsequent encounter for closed fracture with routine healing: Secondary | ICD-10-CM | POA: Diagnosis not present

## 2021-08-21 LAB — FRACTIONAL EXCRET-NA(24HR UR +SERUM)
Creatinine, Ser: 2.14 mg/dL — ABNORMAL HIGH (ref 0.61–1.24)
Creatinine, Urine: 189.72 mg/dL
Fractional Excretion of Na: 0 %
Sodium, Ur: 14 mmol/L
Sodium: 136 mmol/L (ref 135–145)

## 2021-08-21 LAB — CBC
HCT: 29.7 % — ABNORMAL LOW (ref 39.0–52.0)
Hemoglobin: 9.8 g/dL — ABNORMAL LOW (ref 13.0–17.0)
MCH: 30.7 pg (ref 26.0–34.0)
MCHC: 33 g/dL (ref 30.0–36.0)
MCV: 93.1 fL (ref 80.0–100.0)
Platelets: 195 10*3/uL (ref 150–400)
RBC: 3.19 MIL/uL — ABNORMAL LOW (ref 4.22–5.81)
RDW: 13.9 % (ref 11.5–15.5)
WBC: 9.2 10*3/uL (ref 4.0–10.5)
nRBC: 0 % (ref 0.0–0.2)

## 2021-08-21 LAB — BASIC METABOLIC PANEL
Anion gap: 11 (ref 5–15)
BUN: 41 mg/dL — ABNORMAL HIGH (ref 8–23)
CO2: 20 mmol/L — ABNORMAL LOW (ref 22–32)
Calcium: 8.1 mg/dL — ABNORMAL LOW (ref 8.9–10.3)
Chloride: 105 mmol/L (ref 98–111)
Creatinine, Ser: 1.59 mg/dL — ABNORMAL HIGH (ref 0.61–1.24)
GFR, Estimated: 45 mL/min — ABNORMAL LOW (ref >60)
Glucose, Bld: 124 mg/dL — ABNORMAL HIGH (ref 70–99)
Potassium: 4.4 mmol/L (ref 3.5–5.1)
Sodium: 136 mmol/L (ref 135–145)

## 2021-08-21 LAB — PROTIME-INR
INR: 1.7 — ABNORMAL HIGH (ref 0.8–1.2)
Prothrombin Time: 19.8 seconds — ABNORMAL HIGH (ref 11.4–15.2)

## 2021-08-21 MED ORDER — ENOXAPARIN SODIUM 80 MG/0.8ML IJ SOSY
80.0000 mg | PREFILLED_SYRINGE | Freq: Two times a day (BID) | INTRAMUSCULAR | Status: DC
  Filled 2021-08-21: qty 0.8

## 2021-08-21 MED ORDER — ENOXAPARIN SODIUM 80 MG/0.8ML IJ SOSY
80.0000 mg | PREFILLED_SYRINGE | Freq: Two times a day (BID) | INTRAMUSCULAR | Status: DC
  Administered 2021-08-21 – 2021-08-22 (×4): 80 mg via SUBCUTANEOUS
  Filled 2021-08-21 (×5): qty 0.8

## 2021-08-21 MED ORDER — SENNA 8.6 MG PO TABS
1.0000 | ORAL_TABLET | Freq: Every day | ORAL | Status: DC
  Administered 2021-08-22 – 2021-08-28 (×5): 8.6 mg via ORAL
  Filled 2021-08-21 (×7): qty 1

## 2021-08-21 MED ORDER — POLYETHYLENE GLYCOL 3350 17 G PO PACK
17.0000 g | PACK | Freq: Every day | ORAL | Status: DC
  Administered 2021-08-22 – 2021-08-24 (×3): 17 g via ORAL
  Filled 2021-08-21 (×7): qty 1

## 2021-08-21 MED ORDER — OXYCODONE HCL 5 MG PO TABS
2.5000 mg | ORAL_TABLET | Freq: Four times a day (QID) | ORAL | Status: DC | PRN
  Administered 2021-08-23 – 2021-08-28 (×7): 2.5 mg via ORAL
  Filled 2021-08-21 (×7): qty 1

## 2021-08-21 MED ORDER — OXYCODONE HCL 5 MG PO TABS: 2.5000 mg | ORAL_TABLET | ORAL | Status: DC | PRN

## 2021-08-21 MED ORDER — OXYCODONE HCL 5 MG PO TABS
5.0000 mg | ORAL_TABLET | Freq: Three times a day (TID) | ORAL | Status: DC
  Administered 2021-08-21 – 2021-08-24 (×10): 5 mg via ORAL
  Filled 2021-08-21 (×10): qty 1

## 2021-08-21 MED ORDER — ACETAMINOPHEN 325 MG PO TABS
650.0000 mg | ORAL_TABLET | Freq: Four times a day (QID) | ORAL | Status: DC
  Administered 2021-08-21 – 2021-08-26 (×18): 650 mg via ORAL
  Filled 2021-08-21 (×18): qty 2

## 2021-08-21 NOTE — Progress Notes (Signed)
Physical Therapy Treatment ?Patient Details ?Name: [name] ?MRN: [id number] ?DOB: [birth year] ?Today's Date: 08/21/2021 ? ? ?History of Present Illness Pt is a 76 yr old had a witness fall on 3/10  a comminuted displaced vertical basicervical left femoral neck fracture and s/p L THA with an anterior approach. PMH: efib, COPD, GERD, HLD, CAD s/p stent CVA x2  with L side weakness, progressive supranuclear palsy, IBS, MI, PNA ? ?  ?PT Comments  ? ? Pt was able to tolerate more movement today. Manual assistance was provided with TE. Pt was able to participate more with TE on R LE compared to L LE. Pt mobilize in be with max to total assist +2 to get to EOB. Pt required max assist +2 to stand with Stedy. During treatment session patient showed deficits in strength, endurance, activity tolerance. Recommending therapy services at skilled nursing facility to address the previously stated deficits. Will continue to follow acutely to maximize functional mobility, independence and safety. ?  ?Recommendations for follow up therapy are one component of a multi-disciplinary discharge planning process, led by the attending physician.  Recommendations may be updated based on patient status, additional functional criteria and insurance authorization. ? ?Follow Up Recommendations ? Skilled nursing-short term rehab (<3 hours/day) ?  ?  ?Assistance Recommended at Discharge Frequent or constant Supervision/Assistance  ?Patient can return home with the following Two people to help with walking and/or transfers;Two people to help with bathing/dressing/bathroom;Direct supervision/assist for medications management;Help with stairs or ramp for entrance;Assist for transportation;Assistance with cooking/housework;Direct supervision/assist for financial management;Assistance with feeding ?  ?Equipment Recommendations ? None recommended by PT  ?  ?Recommendations for Other Services   ? ? ?  ?Precautions / Restrictions  Precautions ?Precautions: Anterior Hip;Fall ?Restrictions ?Weight Bearing Restrictions: Yes ?LLE Weight Bearing: Weight bearing as tolerated  ?  ? ?Mobility ? Bed Mobility ?Overal bed mobility: Needs Assistance ?Bed Mobility: Supine to Sit ?Rolling: Max assist ?  ?Supine to sit: +2 for physical assistance, Total assist ?  ?  ?  ?  ? ?Transfers ?Overall transfer level: Needs assistance ?  ?Transfers: Sit to/from Stand, Bed to chair/wheelchair/BSC ?Sit to Stand: Max assist, +2 physical assistance, From elevated surface ?  ?  ?  ?  ?  ?General transfer comment: Utilize the stedy to transfer from bed to chair. Pt was max assist +2 for physical assistance and equipment managment. ?Transfer via Lift Equipment: Stedy ? ?Ambulation/Gait ?  ?  ?  ?  ?  ?  ?  ?  ? ? ?Stairs ?  ?  ?  ?  ?  ? ? ?Wheelchair Mobility ?  ? ?Modified Rankin (Stroke Patients Only) ?  ? ? ?  ?Balance Overall balance assessment: Needs assistance ?Sitting-balance support: Feet supported, Single extremity supported ?Sitting balance-Leahy Scale: Poor ?Sitting balance - Comments: pt required min-mod assist with sitting balance. ?Postural control: Right lateral lean ?Standing balance support: Bilateral upper extremity supported, Reliant on assistive device for balance ?Standing balance-Leahy Scale: Zero ?Standing balance comment: Pt required max assist +2 and UE supported and was still not able to stand up all the way. Pt was able to stand enough in order to get the [place] seat under him. ?  ?  ?  ?  ?  ?  ?  ?  ?  ?  ?  ?  ? ?  ?Cognition Arousal/Alertness: Suspect due to medications, Lethargic ?Behavior During Therapy: Flat affect ?Overall Cognitive Status: Impaired/Different from baseline ?Area of Impairment: Attention,  Memory, Following commands, Safety/judgement, Awareness ?  ?  ?  ?  ?  ?  ?  ?  ?  ?Current Attention Level: Sustained ?Memory: Decreased short-term memory ?Following Commands: Follows one step commands inconsistently, Follows one step  commands with increased time ?  ?  ?  ?  ?  ?  ? ?  ?Exercises General Exercises - Lower Extremity ?Quad Sets: PROM, 10 reps, Supine, Both ?Heel Slides: PROM, Both, 10 reps, Supine ? ?  ?General Comments General comments (skin integrity, edema, etc.): Pt's son was in the room observing the session. ?  ?  ? ?Pertinent Vitals/Pain Pain Assessment ?Pain Assessment: Faces ?Faces Pain Scale: Hurts even more ?Pain Location: pain at the operative site, pain with any movement of the L LE, or attempting to complete rolling ?Pain Descriptors / Indicators: Operative site guarding  ? ? ?Home Living   ?  ?  ?  ?  ?  ?  ?  ?  ?  ?   ?  ?Prior Function    ?  ?  ?   ? ?PT Goals (current goals can now be found in the care plan section)   ? ?  ?Frequency ? ? ? Min 5X/week ? ? ? ?  ?PT Plan Discharge plan needs to be updated  ? ? ?Co-evaluation   ?  ?  ?  ?  ? ?  ?AM-PAC PT "6 Clicks" Mobility   ?Outcome Measure ? Help needed turning from your back to your side while in a flat bed without using bedrails?: A Lot ?Help needed moving from lying on your back to sitting on the side of a flat bed without using bedrails?: A Lot ?Help needed moving to and from a bed to a chair (including a wheelchair)?: A Lot ?Help needed standing up from a chair using your arms (e.g., wheelchair or bedside chair)?: A Lot ?Help needed to walk in hospital room?: Total ?Help needed climbing 3-5 steps with a railing? : Total ?6 Click Score: 10 ? ?  ?End of Session Equipment Utilized During Treatment: Gait belt ?Activity Tolerance: Patient tolerated treatment well ?Patient left: with call bell/phone within reach;in bed ?Nurse Communication: Mobility status;Patient requests pain meds ?PT Visit Diagnosis: Other symptoms and signs involving the nervous system (R29.898);Pain;History of falling (Z91.81);Muscle weakness (generalized) (M62.81) ?Pain - Right/Left: Left ?Pain - part of body: Hip;Knee ?  ? ? ?Time: 9326-7124 ?PT Time Calculation (min) (ACUTE ONLY): 38  min ? ?Charges:  $Therapeutic Exercise: 8-22 mins ?$Therapeutic Activity: 23-37 mins          ?          ? ?Quenton Fetter, SPT ? ? ? ?Quenton Fetter ?08/21/2021, 5:51 PM ? ?

## 2021-08-21 NOTE — TOC Benefit Eligibility Note (Signed)
Patient Advocate Encounter  Insurance verification completed.    The patient is currently admitted and upon discharge could be taking Eliquis 5 mg.  The current 30 day co-pay is, $45.00.   The patient is currently admitted and upon discharge could be taking Xarelto 20 mg.  The current 30 day co-pay is, $45.00.   The patient is insured through [employer] Medicare Part D    Delenn Ahn, CPhT Pharmacy Patient Advocate Specialist [facility] Patient Advocate Team Direct Number: [phone]  Fax: [phone]        

## 2021-08-21 NOTE — Progress Notes (Signed)
Provider made aware patient's JP site is leaking every time patient gets OOB. Drsg changed twice today. JP continues to hold suction.  ?

## 2021-08-21 NOTE — Progress Notes (Signed)
ANTICOAGULATION CONSULT NOTE ? ?Pharmacy Consult for warfarin/enoxaparin  ?Indication: atrial fibrillation ? ?Allergies  ?Allergen Reactions  ? Bactrim [Sulfamethoxazole-Trimethoprim] Anaphylaxis  ?  Ulcers  ? Ciprofloxacin Swelling  ?  Tongue  ? Crestor [Rosuvastatin Calcium] Other (See Comments)  ?  Severe Myalgias  ? ? ?Patient Measurements: ?Height: 6' (182.9 cm) ?Weight: 81 kg (178 lb 9.2 oz) ?IBW/kg (Calculated) : 77.6 ?Heparin Dosing Weight: 81 kg ? ?Vital Signs: ?Temp: 97.6 ?F (36.4 ?C) (03/16 0448) ?Temp Source: Oral (03/16 0448) ?BP: 118/73 (03/16 0448) ?Pulse Rate: 67 (03/16 0448) ? ?Labs: ?Recent Labs  ?  08/19/21 ?0228 08/20/21 ?0236 08/20/21 ?0600 08/20/21 ?1425 08/21/21 ?0621  ?HGB 11.3* 10.3*  --   --  9.8*  ?HCT 34.0* 30.4*  --   --  29.7*  ?PLT 145* 213  --   --  195  ?LABPROT 16.4* 19.3*  --   --  19.8*  ?INR 1.3* 1.6*  --   --  1.7*  ?CREATININE 1.20  --  2.45* 2.05* 1.59*  ? ? ? ?Estimated Creatinine Clearance: 44.1 mL/min (A) (by C-G formula based on SCr of 1.59 mg/dL (H)). ? ? ?Medical History: ?Past Medical History:  ?Diagnosis Date  ? BPH (benign prostatic hyperplasia)   ? COPD (chronic obstructive pulmonary disease) ([place]) 2021  ? Coronary artery disease   ? a. s/p PCI to LCx in 2007; b. MV 2017 no sig ischemia, EF 59%, nl study. c. Cath 01/2020 without significant recurrent dz.  ? Depression   ? GERD (gastroesophageal reflux disease)   ? Hyperlipidemia   ? Hypertension   ? IBS (irritable bowel syndrome)   ? Myocardial infarction [facility])   ? 12-13 yrs ago   ? PAF (paroxysmal atrial fibrillation) ([place])   ? a. CHADS2VASc => 5 (HTN, age x 1, stroke x 2, vascular disease); b. on Coumadin   ? PNA (pneumonia)   ? Skin cancer   ? face   ? Sleep apnea   ? off cpap   ? Stroke [facility])   ? a. x 2 with residual left-sided weakness  ?  ? ? ?Assessment: ?62 yom who admitted after fall resulting in left femoral neck fracture. He is on warfarin PTA for PAF.  ? ?Patient's Hgb continues to drop s/p left total hip  arthroplasty 12.9>>11.3>>10.3>>9.8. Hgb drop is possibly due to hemodilution, PLT 195 (stable). INR Subtherapeutic at 1.7 with last warfarin dose 3/14 PM.  In light of progressive AKI, there is concern patient may need an intervention at some point in the near future. After discussion with FMTS, will hold warfarin and continue enoxaparin. RN does note that there is some bloody output from the JP drain, but after discussion with MD we will proceed with enoxaparin administration.  ? ?PTA warfarin 7.'5mg'$  on Sun/Thurs, 3.'75mg'$  all other days  ? ?Goal of Therapy:  ?Heparin level 0.3-0.7 units/ml ?Monitor platelets by anticoagulation protocol: Yes ?  ?Plan:  ?Increase enoxaparin 80 mg [place] Q24H>>80 mg [place] Q12H d/t improved renal fxn  ?HOLD Warfarin  ?Monitor for signs/symptoms of bleeding, renal function ?F/U warfarin restart   ? ?Adria Dill, PharmD ?PGY-1 Acute Care Resident  ?08/21/2021 7:36 AM  ? ? ?

## 2021-08-21 NOTE — Progress Notes (Signed)
Family Medicine Teaching Service ?Daily Progress Note ?Intern Pager: [phone] ? ?Patient name: [name] Medical record number: [id number] ?Date of birth: Feb 04, 1946 Age: 76 y.o. Gender: male ? ?Primary Care Provider: [name], MD ?Consultants: Orthopedics ?Code Status: DNR ? ?Pt Overview and Major Events to Date:  ?3/11: admitted for left femoral fracture ?3/13: Left total hip arthroplasty ? ?Assessment and Plan: ?[name] is a 76 year old male presenting with recurrent falls and consequent left femoral fracture now s/p left total hip arthroplasty.  Past medical history significant for progressive supranuclear palsy, paroxysmal A-fib on Coumadin, CAD s/p stent (2017), MI (2007), CVA x2, COPD, GERD, hypertension, hyperlipidemia, BPH. ? ?AKI: Improved  poor UOP: Improved ?Creatinine improved form 2.45 yesterday to 1.59 today. S/p 1L LR bolus and increased fluids.  975 mL UOP in the last 24 hours which is improved.  Improving with fluids and likely will not benefit from nephrology consult but may consider at a later date. ?-Continue maintenance NS at 120 mL/h ? ?Left femoral neck fracture s/p left total hip arthroplasty: Stable ?Pain questionably well controlled but according to wife tends to do better when pain medications are regularly on board and patient will not ask for pain medications.  Will benefit from scheduled medications with as needed medications and will adjust accordingly.  Careful of morphine use in previous delirious state. ?-Oxy IR 5 mg every 8 hours ?-Oxy IR 2.5 mg every 6 hours as needed ?-Tylenol scheduled 50 mg every 6 hours ?-Discontinue Norco and Tylenol orders, and Robaxin orders ? ?Delirium ?Per discussion with wife, improved last night.  Very drowsy this morning and not able to converse well.  May be multifactorial in nature which includes but is not limited to anemia, opioid use, acute pain, dehydration, hospitalization and unfamiliar environment with most likely due to  acute pain in the setting.  Will attempt to manage pain more appropriately and continue to assess. ?-Delirium precautions ?-PT/OT eval and treat ? ?Constipation: Resolved ?Patient had very large stool.  Abdomen still tense and likely will need to stool more but will pull back on bowel regimen. ?-Transition MiraLAX to daily ?-Transition senna to daily ?-Discontinue lactulose ? ?Anemia: Worsened ?Hemoglobin decreased from 10.3 to 9.8. Given decrease in all cell lines, likely to be more related to dilution at this point.  No signs of hematoma or bleeding near surgical site.  Warfarin continued to be held. Continue to hold Warfarin but will require more discussion in the setting of frequent falls and poor balance with worsening prognosis of PSP. ?-A.m. CBC ?-continue Lovenox ? ?FEN/GI: Heart healthy ?PPx: Lovenox ?Dispo: AIR  pending clinical improvement . Barriers include AKI, delirium, PT/OT.  ? ?Subjective:  ?Patient was sleeping largely through encounter and thorough discussion had with wife.  She states that he had a large bowel movement that was all over the bed and required him to be rolled several times to be cleaned up.  She is asking that we decrease the stooling medication due to this.  He is unable to participate well in therapy without regular pain medications on board.  She notes that when he has regular pain medications he appears more as himself and less irritable.  Notes that he was acting differently yesterday but appeared to improve as the day went by. ? ?Objective: ?Temp:  [97.6 ?F (36.4 ?C)-98.3 ?F (36.8 ?C)] 98.3 ?F (36.8 ?C) (03/16 0093) ?Pulse Rate:  [67-78] 68 (03/16 0806) ?Resp:  [15-20] 18 (03/16 0806) ?BP: (113-120)/(59-85) 117/85 (03/16 8182) ?SpO2:  [  94 %-95 %] 95 % (03/16 0806) ?Physical Exam: ?General: fully conversational due to fatigue, able to follow most commands ?Cardiovascular: RRR, no murmurs auscultated ?Respiratory: CTA B, no increased work of breathing ?Abdomen: Tense,  distended, nontender, normoactive bowel sounds ?Extremities: Surgical site bandaged without surrounding erythema or infectious signs, minimal blood in drainage bulb, 2+ pedal pulses bilaterally ? ?Laboratory: ?Recent Labs  ?Lab 08/19/21 ?0228 08/20/21 ?0236 08/21/21 ?6433  ?WBC 10.4 16.2* 9.2  ?HGB 11.3* 10.3* 9.8*  ?HCT 34.0* 30.4* 29.7*  ?PLT 145* 213 195  ? ?Recent Labs  ?Lab 08/15/21 ?2045 08/17/21 ?0050 08/20/21 ?0600 08/20/21 ?1425 08/21/21 ?0621  ?NA 139   < > 135 133* 136  ?K 3.9   < > 4.1 4.1 4.4  ?CL 104   < > 100 102 105  ?CO2 25   < > 22 20* 20*  ?BUN 11   < > 48* 48* 41*  ?CREATININE 1.05   < > 2.45* 2.05* 1.59*  ?CALCIUM 8.9   < > 8.2* 8.2* 8.1*  ?PROT 6.8  --   --   --   --   ?BILITOT 0.4  --   --   --   --   ?ALKPHOS 114  --   --   --   --   ?ALT 55*  --   --   --   --   ?AST 45*  --   --   --   --   ?GLUCOSE 107*   < > 131* 110* 124*  ? < > = values in this interval not displayed.  ? ?Imaging/Diagnostic Tests: ?DG Abd 1 View ? ?Result Date: 08/20/2021 ?CLINICAL DATA:  Constipation.  No abdominal pain EXAM: ABDOMEN - 1 VIEW COMPARISON:  None. FINDINGS: Nonobstructive bowel gas pattern. Mild-to-moderate colonic stool burden. Pelvic phleboliths. Partially visualized left hip arthroplasty. No acute osseous abnormality IMPRESSION: No evidence of bowel obstruction. Mild-to-moderate colonic stool burden. Electronically Signed   By: Maurine Simmering M.D.   On: 08/20/2021 12:08  ? ?US RENAL ? ?Result Date: 08/20/2021 ?CLINICAL DATA:  Renal dysfunction EXAM: RENAL / URINARY TRACT ULTRASOUND COMPLETE COMPARISON:  None. FINDINGS: Right Kidney: Renal measurements: 11.3 x 5.5 x 6.7 cm = volume: 218.9 mL. There is no hydronephrosis. There is 1.9 x 1.3 cm exophytic cyst in the midpole. Left Kidney: Renal measurements: 11.8 x 6.7 x 5.8 cm = volume: 243 mL. There is no hydronephrosis. Bladder: Urinary bladder is empty and not evaluated. Other: None. IMPRESSION: There is no hydronephrosis. There is 1.9 cm cyst in the right  kidney. Electronically Signed   By: Elmer Picker M.D.   On: 08/20/2021 12:06  ? ?DG CHEST PORT 1 VIEW ? ?Result Date: 08/20/2021 ?CLINICAL DATA:  Wheezing. EXAM: PORTABLE CHEST 1 VIEW COMPARISON:  08/15/2021. FINDINGS: Patient is slightly rotated. Trachea is midline. Heart size stable. Biapical pleural thickening. There may be linear atelectasis or scarring in the left midlung zone. Lungs are otherwise clear but somewhat low in volume. No definite pleural fluid. IMPRESSION: No acute findings. Electronically Signed   By: Lorin Picket M.D.   On: 08/20/2021 15:47   ? ? ?Wells Guiles, DO ?08/21/2021, 9:15 AM ?PGY-1, [facility] ?[place] Intern pager: [phone], text pages welcome ? ?

## 2021-08-21 NOTE — TOC Initial Note (Signed)
Transition of Care (TOC) - Initial/Assessment Note  ? ? ?Patient Details  ?Name: [name] ?MRN: [id number] ?Date of Birth: 21-Jan-1946 ? ?Transition of Care [facility]) CM/SW Contact:    ?Sharin Mons, RN ?Phone Number: ?08/21/2021, 1:02 PM ? ?Clinical Narrative:                 ?Presents after a fall, suffered L femoral fx. Hx of afib, COPD, GERD, HLD, CAD s/p stent CVA x 2  with L side weakness, progressive supranuclear palsy, IBS, MI, PNA. From home with wife.  ?NCM spoke with pt's wife ( pt oriented to self) regarding d/c planning. Wife states SNF placement is not an option. 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 States agreeable to home health services. Wife without preference. Referral made with [facility] and accepted. Home health orders and F2F will be needed from MD for home health services. ? ?Pt without DME needs ? ?TOC following and will assist with needs... ? ?Expected Discharge Plan: [place] (declined SNF placement) ?Barriers to Discharge: Continued Medical Work up ? ? ?Patient Goals and CMS Choice ?  ?  ?Choice offered to / list presented to : Spouse ? ?Expected Discharge Plan and Services ?Expected Discharge Plan: [place] (declined SNF placement) ?  ?Discharge Planning Services: CM Consult ?  ?  ?                ?  ?  ?  ?  ?  ?HH Arranged: PT, OT, Social Work, Nurse's Aide ?[place] Agency: [facility] ?Date HH Agency Contacted: 08/21/21 ?Time [place]: 0321 ?Representative spoke with at [place]: Tommi Rumps ? ?Prior Living Arrangements/Services ?  ?Lives with:: Spouse ?Patient language and need for interpreter reviewed:: Yes ?Do you feel safe going back to the place where you live?: Yes      ?Need for Family Participation in Patient Care: Yes (Comment) ?Care giver support system in place?: Yes (comment) ?Current home services: DME (hoyer lift, WC, rw, bsc, raised toilet seat, transport  chair) ?Criminal Activity/Legal Involvement Pertinent to Current Situation/Hospitalization: No - Comment as needed ? ?Activities of Daily Living ?Home Assistive Devices/Equipment: Gilford Rile (specify type), Wheelchair, Bedside commode/3-in-1, Dentures (specify type) ?ADL Screening (condition at time of admission) ?Patient's cognitive ability adequate to safely complete daily activities?: Yes ?Is the patient deaf or have difficulty hearing?: No ?Does the patient have difficulty seeing, even when wearing glasses/contacts?: No ?Does the patient have difficulty concentrating, remembering, or making decisions?: No ?Patient able to express need for assistance with ADLs?: Yes ?Does the patient have difficulty dressing or bathing?: Yes ?Independently performs ADLs?: No ?In/Out Bed: Needs assistance (has hospital) ?Walks in Home: Needs assistance ?Does the patient have difficulty walking or climbing stairs?: Yes ?Weakness of Legs: Left ?Weakness of Arms/Hands: Left ? ?Permission Sought/Granted ?  ?  ? Share Information with NAME: [name] (Spouse)    [phone] ?   ?   ?   ? ?Emotional Assessment ?Appearance:: Appears stated age ?  ?  ?Orientation: : Oriented to Self ?Alcohol / Substance Use: Not Applicable ?Psych Involvement: No (comment) ? ?Admission diagnosis:  Leg fracture, left [S82.92XA] ?Closed fracture of neck of left femur, initial encounter ([place]) [S72.002A] ?Closed left hip fracture, initial encounter ([place]) [S72.002A] ?Patient Active Problem List  ? Diagnosis Date Noted  ? AKI (acute kidney injury) ([place])   ? Status post total hip replacement, left   ?  Closed fracture of neck of left femur ([place]) 08/16/2021  ? Leg fracture, left 08/16/2021  ? Insect bite 04/15/2021  ? Drug-induced tremor 03/20/2021  ? Depression, recurrent ([place]) 01/18/2021  ? Urinary frequency 01/18/2021  ? Partial small bowel obstruction ([place]) 01/13/2021  ? Thyroid nodule 12/23/2020  ? Suspected Movement disorder- possibly PD  12/22/2020  ?  Hematoma- Paraspinal from Fall  12/22/2020  ? Frequent falls 11/13/2020  ? Orthostatic dizziness 08/28/2020  ? Statin myopathy 06/07/2019  ? Chronic obstructive pulmonary disease ([place]) 06/13/2018  ? Sleep apnea 09/16/2017  ? Constipation 07/21/2017  ? GERD (gastroesophageal reflux disease) 02/08/2015  ? IBS (irritable bowel syndrome) 02/08/2015  ? Essential hypertension 02/08/2015  ? AF (paroxysmal atrial fibrillation) ([place]) 01/23/2015  ? CAD (coronary artery disease) 01/23/2015  ? History of CVA (cerebrovascular accident) 01/23/2015  ? ?PCP:  [name], MD ?Pharmacy:   ?[id number] [place] - [id number]?Phone: [phone]: [phone]? ? ? ? ?Social Determinants of Health (SDOH) Interventions ?  ? ?Readmission Risk Interventions ?No flowsheet data found. ? ? ?

## 2021-08-21 NOTE — Progress Notes (Signed)
FPTS Interim Night Progress Note ? ?S:Patient sleeping comfortably.  Rounded with primary night RN.  No concerns voiced.  No orders required.   ? ?O: ?Today's Vitals  ? 08/20/21 2100 08/20/21 2146 08/21/21 0104 08/21/21 0204  ?BP: (!) 113/59     ?Pulse: 78     ?Resp: 15     ?Temp:      ?TempSrc:      ?SpO2: 94%     ?Weight:      ?Height:      ?PainSc:  Asleep 10-Worst pain ever Asleep  ? ? ? ? ?A/P: ?Continue current management ? ?Carollee Leitz MD ?PGY-3, [facility] ?Service pager [phone]   ?

## 2021-08-21 NOTE — Progress Notes (Signed)
Occupational Therapy Treatment ?Patient Details ?Name: [name] ?MRN: [id number] ?DOB: April 30, 1946 ?Today's Date: 08/21/2021 ? ? ?History of present illness Pt is a 76 yr old had a witness fall on 3/10  a comminuted displaced vertical basicervical left femoral neck fracture and s/p L THA with an anterior approach. PMH: efib, COPD, GERD, HLD, CAD s/p stent CVA x2  with L side weakness, progressive supranuclear palsy, IBS, MI, PNA ?  ?OT comments ? Pt's wife wanted this reporting therapist to come to complete treatment however pt was very lethargic in session and limited to ability to attend to one step commands. Pt completed washing R side face with fair to poor ability and verbal cues. Pt attempted multiple trials of rolling side to side with max to total assistance with hand over hand to locate bed rails. Pt's wife educated about positioning to increase in pt visually scanning to locate bed rails when not in therapy to assist with rolling for bed mobility.  Pt currently with functional limitations due to the deficits listed below (see OT Problem List).  Pt will benefit from skilled OT to increase their safety and independence with ADL and functional mobility for ADL to facilitate discharge to venue listed below.  ?  ? ?Recommendations for follow up therapy are one component of a multi-disciplinary discharge planning process, led by the attending physician.  Recommendations may be updated based on patient status, additional functional criteria and insurance authorization. ?   ?Follow Up Recommendations ? Home health OT (Pt's wife would like to take home pt bt recomendation would be SNF)  ?  ?Assistance Recommended at Discharge Frequent or constant Supervision/Assistance  ?Patient can return home with the following ? Two people to help with bathing/dressing/bathroom;Two people to help with walking and/or transfers;Assistance with feeding;Direct supervision/assist for medications management;Direct  supervision/assist for financial management;Assist for transportation;Help with stairs or ramp for entrance;Assistance with cooking/housework ?  ?Equipment Recommendations ? None recommended by OT (Per pt's wife has all DME and son just brought lift)  ?  ?Recommendations for Other Services   ? ?  ?Precautions / Restrictions Precautions ?Precautions: Anterior Hip;Fall ?Restrictions ?Weight Bearing Restrictions: Yes ?LLE Weight Bearing: Weight bearing as tolerated  ? ? ?  ? ?Mobility Bed Mobility ?Overal bed mobility: Needs Assistance ?Bed Mobility: Rolling ?Rolling: Max assist, +2 for physical assistance, +2 for safety/equipment ?  ?  ?  ?  ?General bed mobility comments: attempted rolling side to side but due to lethargic decrease in ability to follow commands ?  ? ?Transfers ?  ?  ?  ?  ?  ?  ?  ?  ?  ?  ?  ?  ?Balance   ?  ?  ?  ?  ?  ?  ?  ?  ?  ?  ?  ?  ?  ?  ?  ?  ?  ?  ?   ? ?ADL either performed or assessed with clinical judgement  ? ?ADL Overall ADL's : Needs assistance/impaired ?Eating/Feeding: Maximal assistance;Bed level ?  ?Grooming: Maximal assistance;Bed level ?  ?Upper Body Bathing: Bed level;Maximal assistance ?  ?Lower Body Bathing: Maximal assistance;+2 for physical assistance;+2 for safety/equipment;Bed level ?  ?Upper Body Dressing : Maximal assistance ?  ?Lower Body Dressing: Maximal assistance;+2 for physical assistance;+2 for safety/equipment ?  ?  ?  ?  ?  ?  ?  ?  ?  ?  ? ?Extremity/Trunk Assessment Upper Extremity Assessment ?Upper Extremity Assessment: LUE deficits/detail ?LUE  Deficits / Details: 2+/5 ROM of shoulder, elbow, weak grasp/grip strength ?LUE Coordination: decreased fine motor;decreased gross motor ?  ?Lower Extremity Assessment ?Lower Extremity Assessment: Defer to PT evaluation ?  ?  ?  ? ?Vision   ?Vision Assessment?: No apparent visual deficits ?Additional Comments: Pt's wife reports needs glasses donned ?  ?Perception   ?  ?Praxis   ?  ? ?Cognition Arousal/Alertness:  Suspect due to medications, Lethargic ?Behavior During Therapy: Flat affect ?Overall Cognitive Status: Impaired/Different from baseline ?Area of Impairment: Attention, Memory, Following commands, Safety/judgement, Awareness ?  ?  ?  ?  ?  ?  ?  ?  ?  ?Current Attention Level: Sustained ?Memory: Decreased short-term memory ?Following Commands: Follows one step commands inconsistently, Follows one step commands with increased time ?  ?  ?  ?General Comments: Pt was able to report was 2023, cues to what month it is and where they are ?  ?  ?   ?Exercises   ? ?  ?Shoulder Instructions   ? ? ?  ?General Comments Pt's spouse educated on positining to increase in ability to reach for bed rails and coordination  ? ? ?Pertinent Vitals/ Pain       Pain Assessment ?Pain Assessment: Faces ?Faces Pain Scale: Hurts even more ?Pain Location: pain at the operative site, pain with any movement of the L LE, or attempting to complete rolling ?Pain Descriptors / Indicators: Operative site guarding ?Pain Intervention(s): Limited activity within patient's tolerance, Monitored during session, Premedicated before session ? ?Home Living   ?  ?  ?  ?  ?  ?  ?  ?  ?  ?  ?  ?  ?  ?  ?  ?  ?  ?  ? ?  ?Prior Functioning/Environment    ?  ?  ?  ?   ? ?Frequency ? Min 3X/week  ? ? ? ? ?  ?Progress Toward Goals ? ?OT Goals(current goals can now be found in the care plan section) ? Progress towards OT goals: Not progressing toward goals - comment (due to lethargic) ? ?Acute Rehab OT Goals ?Patient Stated Goal: pt unable to report goal ?OT Goal Formulation: With patient ?Time For Goal Achievement: 09/03/21 ?Potential to Achieve Goals: Fair ?ADL Goals ?Pt Will Perform Grooming: with min assist;bed level ?Pt Will Perform Upper Body Bathing: bed level;with mod assist ?Pt Will Perform Lower Body Bathing: with mod assist;bed level ?Pt Will Transfer to Toilet: with mod assist;with +2 assist;bedside commode;squat pivot transfer;stand pivot transfer  ?Plan  Discharge plan remains appropriate   ? ?Co-evaluation ? ? ?   ?  ?  ?  ?  ? ?  ?AM-PAC OT "6 Clicks" Daily Activity     ?Outcome Measure ? ? Help from another person eating meals?: A Lot ?Help from another person taking care of personal grooming?: A Lot ?Help from another person toileting, which includes using toliet, bedpan, or urinal?: Total ?Help from another person bathing (including washing, rinsing, drying)?: Total ?Help from another person to put on and taking off regular upper body clothing?: Total ?Help from another person to put on and taking off regular lower body clothing?: Total ?6 Click Score: 8 ? ?  ?End of Session   ? ?OT Visit Diagnosis: Unsteadiness on feet (R26.81);Other abnormalities of gait and mobility (R26.89);Muscle weakness (generalized) (M62.81);History of falling (Z91.81);Pain ?Pain - Right/Left: Left ?Pain - part of body: Hip;Leg;Knee;Ankle and joints of foot ?  ?Activity Tolerance Patient limited  by lethargy ?  ?Patient Left in bed;with call bell/phone within reach;with bed alarm set ?  ?Nurse Communication Mobility status ?  ? ?   ? ?Time: 5379-4327 ?OT Time Calculation (min): 39 min ? ?Charges: OT General Charges ?$OT Visit: 1 Visit ?OT Treatments ?$Self Care/Home Management : 38-52 mins ? ?Joeseph Amor OTR/L  ?Acute Rehab Services  ?[phone] office number ?[phone] pager number ? ? ?Joeseph Amor ?08/21/2021, 9:13 AM ?

## 2021-08-22 DIAGNOSIS — S72002D Fracture of unspecified part of neck of left femur, subsequent encounter for closed fracture with routine healing: Secondary | ICD-10-CM | POA: Diagnosis not present

## 2021-08-22 LAB — CBC
HCT: 28 % — ABNORMAL LOW (ref 39.0–52.0)
Hemoglobin: 9.5 g/dL — ABNORMAL LOW (ref 13.0–17.0)
MCH: 31.7 pg (ref 26.0–34.0)
MCHC: 33.9 g/dL (ref 30.0–36.0)
MCV: 93.3 fL (ref 80.0–100.0)
Platelets: 204 10*3/uL (ref 150–400)
RBC: 3 MIL/uL — ABNORMAL LOW (ref 4.22–5.81)
RDW: 13.9 % (ref 11.5–15.5)
WBC: 7.7 10*3/uL (ref 4.0–10.5)
nRBC: 0 % (ref 0.0–0.2)

## 2021-08-22 LAB — PROTIME-INR
INR: 1.6 — ABNORMAL HIGH (ref 0.8–1.2)
Prothrombin Time: 19.4 seconds — ABNORMAL HIGH (ref 11.4–15.2)

## 2021-08-22 LAB — BASIC METABOLIC PANEL
Anion gap: 8 (ref 5–15)
BUN: 30 mg/dL — ABNORMAL HIGH (ref 8–23)
CO2: 21 mmol/L — ABNORMAL LOW (ref 22–32)
Calcium: 8.2 mg/dL — ABNORMAL LOW (ref 8.9–10.3)
Chloride: 109 mmol/L (ref 98–111)
Creatinine, Ser: 1.26 mg/dL — ABNORMAL HIGH (ref 0.61–1.24)
GFR, Estimated: 59 mL/min — ABNORMAL LOW (ref >60)
Glucose, Bld: 110 mg/dL — ABNORMAL HIGH (ref 70–99)
Potassium: 3.8 mmol/L (ref 3.5–5.1)
Sodium: 138 mmol/L (ref 135–145)

## 2021-08-22 LAB — MYOGLOBIN, URINE: Myoglobin, Ur: 6 ng/mL (ref 0–13)

## 2021-08-22 IMAGING — CT CT CTA ABD/PEL W/CM AND/OR W/O CM
2 of 5 series · 13 of 46 positions shown, 15 images · IV contrast (OMNI 350)
Comparison: Thoracic and lumbar MRI 11/13/2020. Chest radiographs
05/08/2020.

CLINICAL DATA: 74-year-old male woke from sleep with abdominal pain
radiating to the back, diaphoretic. Abdominal bloating.

EXAM:
CT ANGIOGRAPHY CHEST, ABDOMEN AND PELVIS
TECHNIQUE: Multidetector CT imaging through the chest, abdomen and pelvis was
performed using the standard protocol during bolus administration of
intravenous contrast. Multiplanar reconstructed images and MIPs were
obtained and reviewed to evaluate the vascular anatomy.
CONTRAST:  100mL OMNIPAQUE IOHEXOL 350 MG/ML SOLN

[Series 5: dissection 2mm · axial · 0.93mm/px · z∈[-711,-117]mm · 10 of 341 slices shown, 12 images]
[im 22/341  soft-tissue]
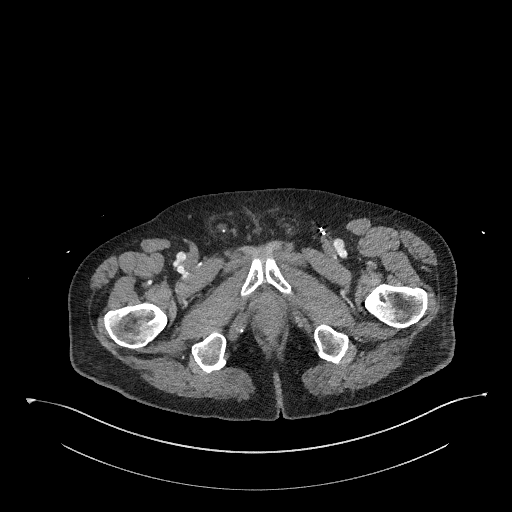
[im 22/341  bone]
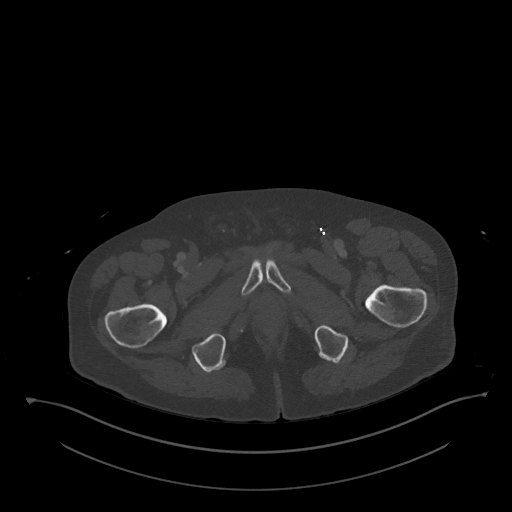
[im 55/341  soft-tissue]
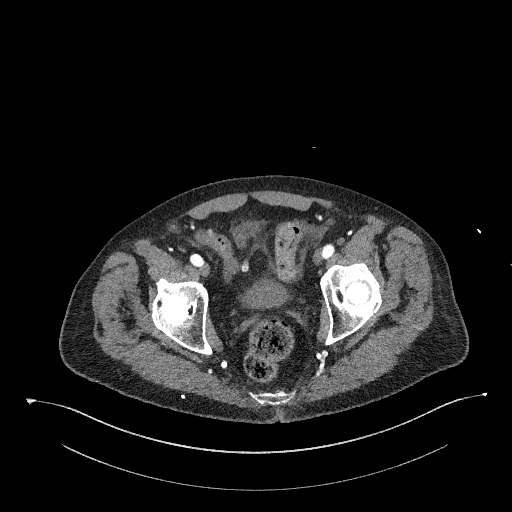
[im 88/341  soft-tissue]
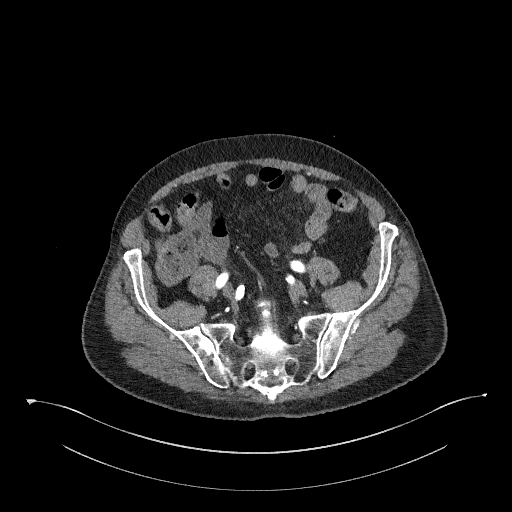
[im 121/341  soft-tissue]
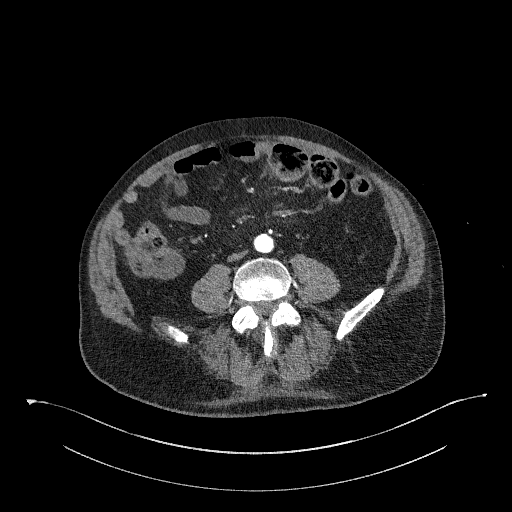
[im 154/341  soft-tissue]
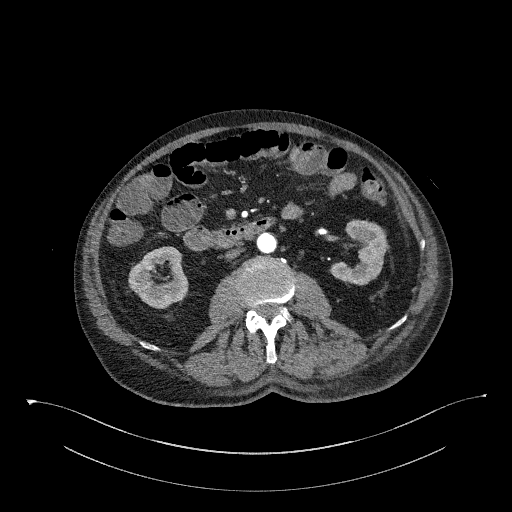
[im 187/341  soft-tissue]
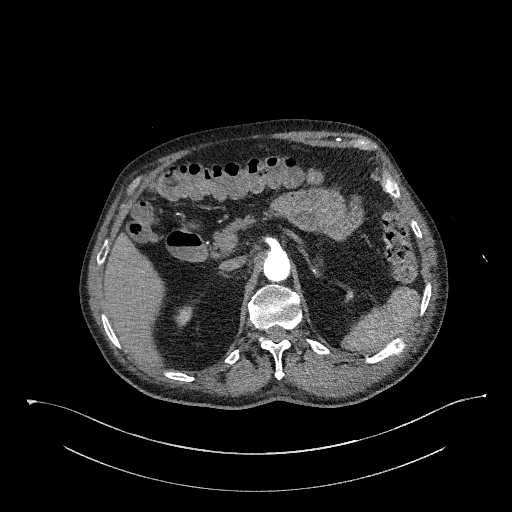
[im 220/341  soft-tissue]
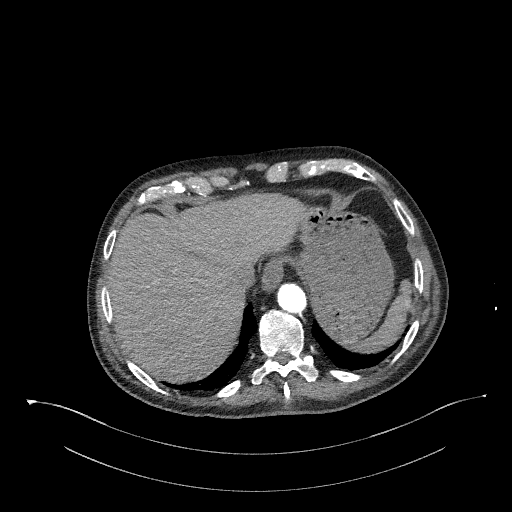
[im 253/341  soft-tissue]
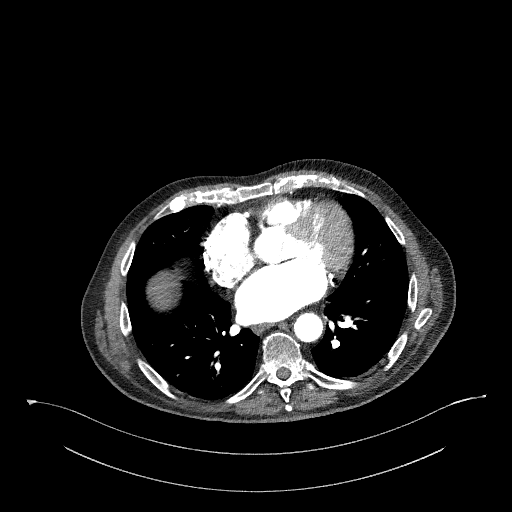
[im 286/341  soft-tissue]
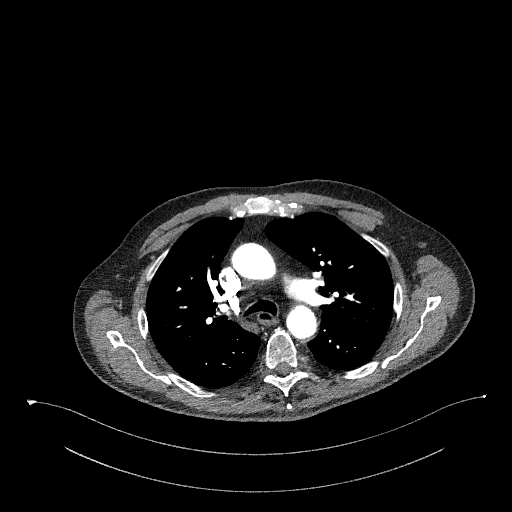
[im 286/341  bone]
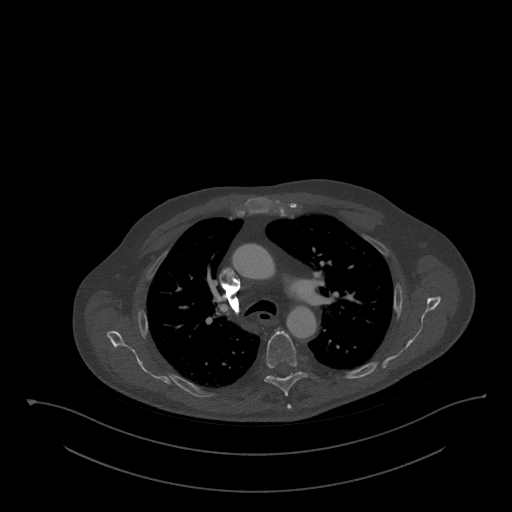
[im 319/341  soft-tissue]
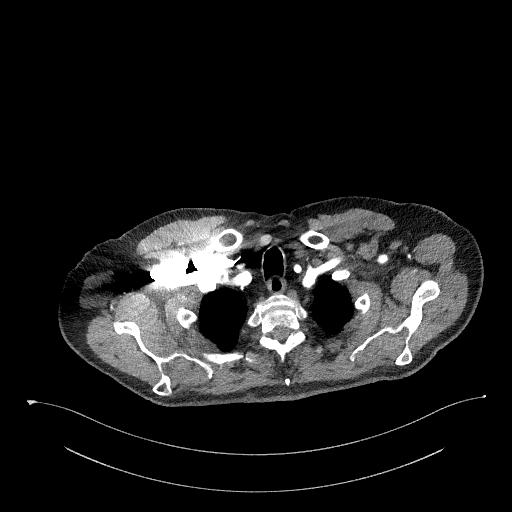

[Series 8: dissection 2mm cor · coronal · 0.92mm/px · 3 of 167 slices shown]
[im 42/167  soft-tissue]
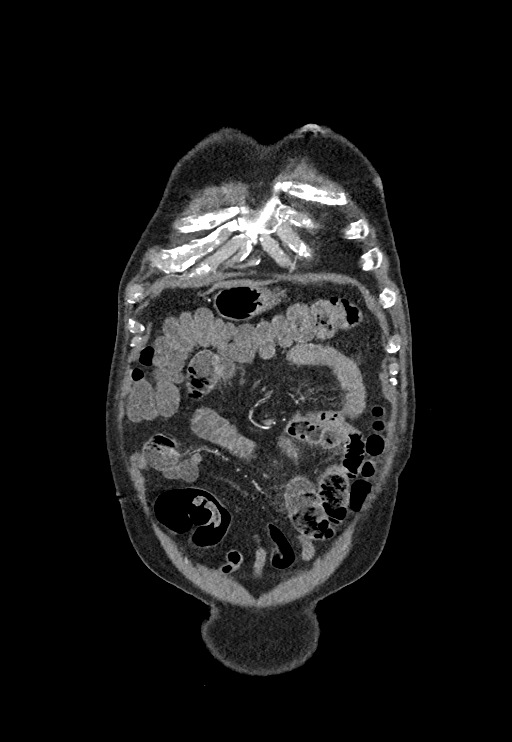
[im 84/167  soft-tissue]
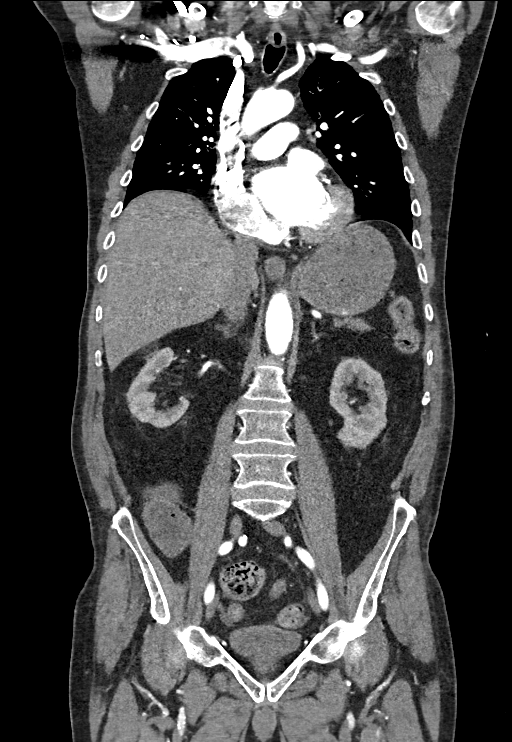
[im 125/167  soft-tissue]
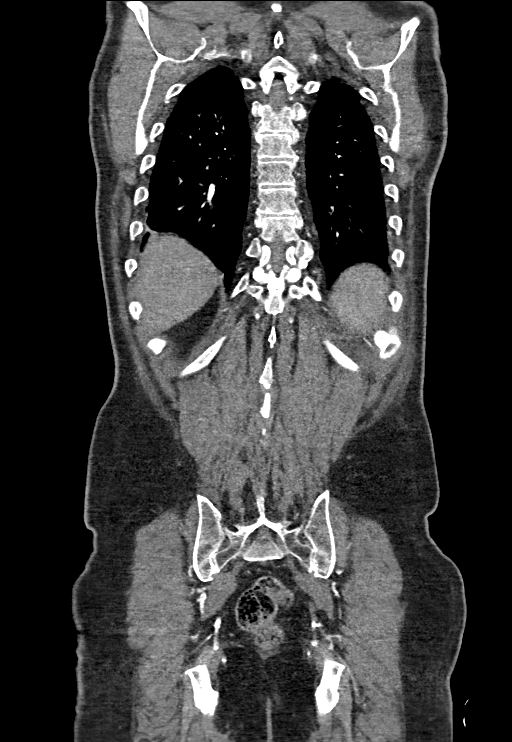

[13 of 46 positions shown; findings below may reference images not displayed]

FINDINGS: CTA CHEST FINDINGS

Cardiovascular: Calcified coronary artery atherosclerosis (series 5,
image 83). Comparatively mild thoracic aortic atherosclerosis.
Negative for thoracic aortic aneurysm or dissection.

Cardiomegaly. No pericardial effusion. Proximal great vessels are
patent.

Mediastinum/Nodes: Negative. No mediastinal lymphadenopathy or
hematoma.

Lungs/Pleura: Atelectatic changes to the major airways which are
otherwise patent. Mild mosaic attenuation in both lungs compatible
with combined mild atelectasis and gas trapping. Mild additional
dependent atelectasis. No pneumothorax, pleural effusion, or
confluent pulmonary opacity.

Musculoskeletal:

There are healing fractures of the posterolateral left 11th and 12th
ribs (series 6 image 157) with abundant periosteal new bone
formation. Possible superimposed more recent nondisplaced fracture
of the posterior 12th rib on series 6, image 165. Chronic fracture
of the left 10th rib costovertebral junction. Chronic fractures of
the anterior left 10th and 11th ribs. No other acute acute osseous
abnormality identified.

Review of the MIP images confirms the above findings.

CTA ABDOMEN AND PELVIS FINDINGS

VASCULAR

Negative for abdominal aortic aneurysm or dissection.
Mild-to-moderate Aortoiliac calcified atherosclerosis. Major aortic
branches are patent including the SMA, and little to no SMA
atherosclerosis is evident. Duplicated right main renal artery
(normal variant). Bilateral iliac and proximal femoral arteries are
patent. Mild to moderate proximal femoral artery atherosclerosis.

Review of the MIP images confirms the above findings.

NON-VASCULAR

Hepatobiliary: Liver and gallbladder are within normal limits.

Pancreas: Atrophied pancreas.  No active inflammation.

Spleen: Spleen is within normal limits.

Adrenals/Urinary Tract: Normal adrenal glands. Nonobstructed kidneys
with symmetric renal enhancement. Small benign appearing right renal
cyst. No nephrolithiasis is evident. Decompressed ureters.
Diminutive urinary bladder. Multiple pelvic phleboliths mostly on
the left.

Stomach/Bowel: Mild retained stool in the rectum. Redundant sigmoid
colon tracks into the mid right abdomen. Moderate sigmoid
diverticulosis including in the central pelvis, but no convincing
sigmoid wall thickening or mesenteric stranding. Small volume of
free fluid abuts the sigmoid colon.

Intermittent diverticulosis of the upstream descending colon with no
convincing inflammation. Fluid in nondilated transverse and right
colon. Normal appendix on series 5, image 254. Decompressed terminal
ileum. Decompressed small bowel in the right lateral abdomen.

Fluid in nondistended stomach and duodenum. Proximal jejunum loops
are within normal limits. But in the mid abdomen there are multiple
dilated air and containing small bowel loops measuring up to about
37 mm diameter (coronal image 30). But these have a gradual
transition both upstream and downstream to decompressed small bowel.
However, there is mild inflammatory stranding in the small bowel
mesentery visible on coronal image 63. No free air. No free fluid in
the abdomen. No pneumoperitoneum identified.

Lymphatic: No lymphadenopathy.

Reproductive: Negative.

Other: Small volume of simple density free fluid in the pelvis on
series 5, image 274. No presacral or perirectal inflammatory
stranding.

Musculoskeletal: Healing fractures of the left L1 and L2 transverse
processes with periosteal new bone formation (series 6, image 181).
Stable lumbar vertebral height and alignment. Sacrum, SI joints,
pelvis and proximal femurs appear intact. No other acute osseous
abnormality identified.

Review of the MIP images confirms the above findings.
IMPRESSION: 1. Negative for aortic dissection or aneurysm. Aortic
Atherosclerosis ([id number]). Calcified coronary artery
atherosclerosis. Cardiomegaly.

2. Small volume of free fluid in the pelvis with appearance of
partial small bowel obstruction versus small bowel ileus in the mid
abdomen, no abrupt transition point.
Superimposed mild inflammatory stranding in the small bowel
mesentery raising the possibility of Acute Enteritis.
The relatively mild atherosclerosis of abdominal aortic branches
argues against bowel ischemia.
Normal appendix. Diverticulosis of the descending and sigmoid colon
without convincing active large bowel inflammation.

3. Healing multiple left lower rib fractures and left L1 and L2
transverse process fractures. The left 12th rib might be fractured
in 2 places.

4. Negative for retroperitoneal hematoma. No other acute or
inflammatory process identified in the chest, abdomen, or pelvis.

## 2021-08-22 IMAGING — CR DG ABD PORTABLE 1V
1 series · 1 of 1 positions shown · non-contrast
Comparison: CT earlier today.

CLINICAL DATA: Follow-up small bowel obstruction.  8 hour delay.

EXAM:
PORTABLE ABDOMEN - 1 VIEW

[abdomen kub]
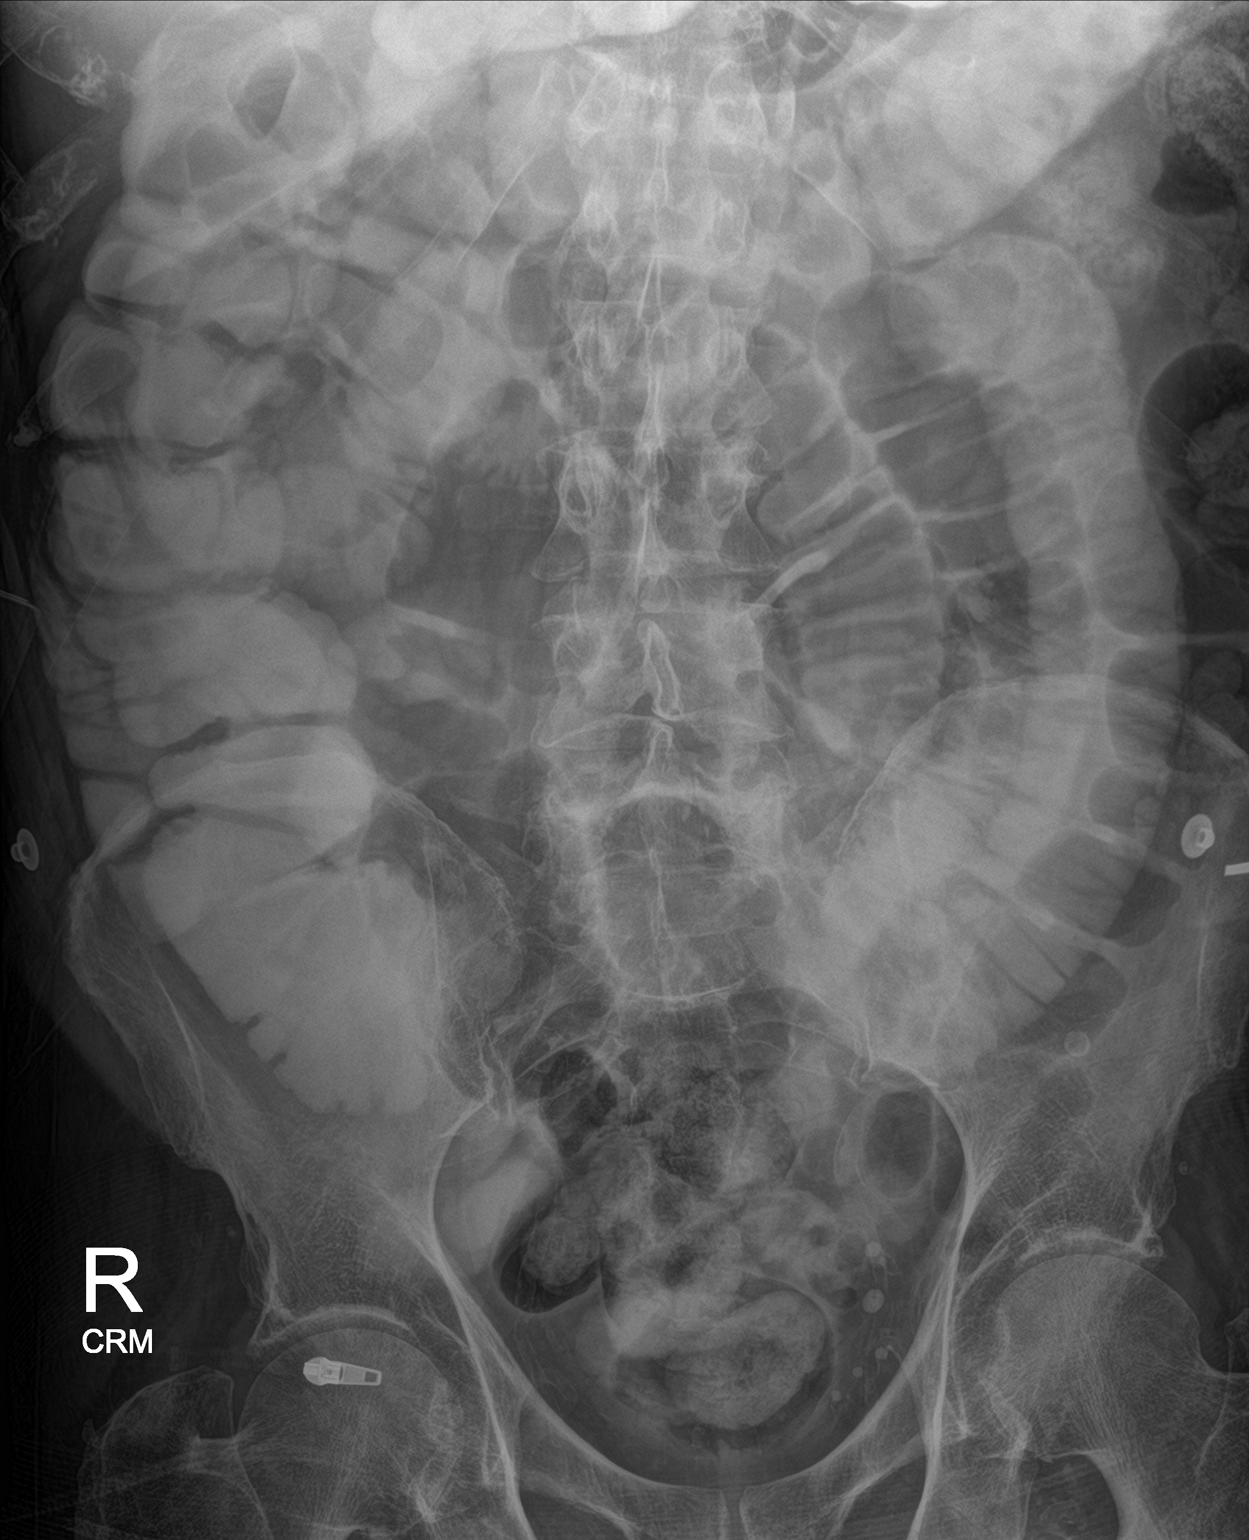

[1 of 1 positions shown; findings below may reference images not displayed]

FINDINGS: Administered enteric contrast is seen within dilated small bowel,
however there is contrast in the ascending and transverse colon.
Central small bowel distension measures up to 4.5 cm.
IMPRESSION: There is contrast in the ascending and transverse colon. Persistent
central small bowel distention measuring up to 4.5 cm.

## 2021-08-22 MED ORDER — AMIODARONE HCL 200 MG PO TABS
200.0000 mg | ORAL_TABLET | Freq: Every day | ORAL | Status: DC
  Administered 2021-08-22 – 2021-08-28 (×7): 200 mg via ORAL
  Filled 2021-08-22 (×7): qty 1

## 2021-08-22 MED ORDER — WARFARIN SODIUM 5 MG PO TABS
5.0000 mg | ORAL_TABLET | Freq: Once | ORAL | Status: AC
  Administered 2021-08-22: 5 mg via ORAL
  Filled 2021-08-22: qty 1

## 2021-08-22 MED ORDER — GUAIFENESIN-DM 100-10 MG/5ML PO SYRP
5.0000 mL | ORAL_SOLUTION | ORAL | Status: DC | PRN
  Administered 2021-08-22 – 2021-08-28 (×7): 5 mL via ORAL
  Filled 2021-08-22 (×7): qty 10

## 2021-08-22 NOTE — Progress Notes (Signed)
Family Medicine Teaching Service ?Daily Progress Note ?Intern Pager: [phone] ? ?Patient name: [name] Medical record number: [id number] ?Date of birth: 11/27/1945 Age: 76 y.o. Gender: male ? ?Primary Care Provider: [name], MD ?Consultants: Orthopedics ?Code Status: DNR ? ?Pt Overview and Major Events to Date:  ?3/11: admitted for left femoral fracture ?3/13: Left total hip arthroplasty ? ?Assessment and Plan: ?[name] is a 76 year old male presenting with recurrent falls and consequent left femoral fracture now s/p left total hip arthroplasty.  Past medical history significant for progressive supranuclear palsy, paroxysmal A-fib on Coumadin, CAD s/p stent (2017), MI (2007), CVA x2, COPD, GERD, hypertension, hyperlipidemia, BPH. ? ?Left femoral neck fracture status post left total hip arthroplasty: Stable ?Pain better controlled with scheduled pain medications and patient was able to interact with physical therapy better yesterday.  Still has steady drainage of blood/fluid from left hip drain. ?-Continue Oxy IR 5 mg every 8 hours, Oxy IR 2.5 mg every 6 hours as needed, Tylenol 650 mg every 6 hours ?-PT/OT eval and treat ? ?Delirium ?Patient was able to interact with physical therapy better yesterday.  Improved but still disoriented to time and place.  Much more alert and able to answer questions and participate in conversation.  Patient likely has baseline neurological impairment that is contributing to delirium state and will not fully improve until at home.  No medications at this time except pain medications are likely contributing to delirium state. ?-Delirium precautions ? ?AKI: Improved  poor UOP: stable ?Creatinine improved from 1.59 to 1.26.  UOP 875 mL in the last 24 hours stable from yesterday.  Patient is continue to receive maintenance fluids and given change in creatinine likely due to prerenal causes. ?-stop mIVF for fluid challenge ?-A.m. BMP ? ?Anemia: Stable ?Hemoglobin  decreased from 9.8 to 9.5. 95 mL drained from left hip bulb. ?-A.m. CBC ?-Restart warfarin per pharmacy ? ?Constipation: Chronic, stable ?No further stools since large bowel movement.  Did not receive bowel regimen yesterday. ?-MiraLAX daily, senna daily ? ?Paroxysmal AFib ?Home medications previously included amiodarone, bisoprolol, aspirin.  Patient unable to ?-Restart amiodarone 200 mg daily ?-Restart warfarin per pharmacy ? ?Cough: acute ?Patient reported to have cough overnight which was wet according to wife.  No sputum production.  Physical exam unremarkable and patient did not have cough during encounter.  No difficulty breathing at this time either.  CXR 2 days prior showed no acute findings.  We will continue to monitor ?-Continue to monitor ?-Continue incentive spirometry ?-Robitussin-DM every 4 hours as needed for cough ? ?FEN/GI: heart healthy ?PPx: Lovenox ?Dispo:Home with home health 1 to 2 days. Barriers include drain output, anemia.  ? ?Subjective:  ?Patient states name but believes he is not [facility] and believes it is 53 but can state the president is [place].  Denies any difficulty breathing or pain at this time.  Patient's wife is frustrated that his fluids were disconnected for several hours last night.  She states they struggle financially and would be unable to cover medical costs of DOAC and will need to stick with medications that do not have cost or have a very minimal cost as $40 a month for DOAC would be too expensive for them. ? ?Objective: ?Temp:  [98.1 ?F (36.7 ?C)-98.3 ?F (36.8 ?C)] 98.1 ?F (36.7 ?C) (03/17 0456) ?Pulse Rate:  [68-73] 73 (03/17 0456) ?Resp:  [17-20] 20 (03/17 0456) ?BP: (106-141)/(73-85) 135/77 (03/17 0456) ?SpO2:  [94 %-96 %] 94 % (03/17 0456) ?Weight:  [93  kg] 87 kg (03/17 0456) ?Physical Exam: ?General: Awake, disoriented to time and place but aware of disorientation, NAD ?Cardiovascular: RRR, no murmurs auscultated ?Respiratory: CTA B, no increased work  of breathing, no crackles or wheezing ?Abdomen: Minimally tense, distended, nontender, hyperactive bowel sounds ?Extremities: Surgical site bandaged without surrounding erythema or infectious signs, minimal blood noted in drainage bulb ? ?Laboratory: ?Recent Labs  ?Lab 08/20/21 ?0236 08/21/21 ?5638 08/22/21 ?9373  ?WBC 16.2* 9.2 7.7  ?HGB 10.3* 9.8* 9.5*  ?HCT 30.4* 29.7* 28.0*  ?PLT 213 195 204  ? ?Recent Labs  ?Lab 08/15/21 ?2045 08/17/21 ?0050 08/20/21 ?1425 08/21/21 ?4287 08/22/21 ?0243  ?NA 139   < > 136  133* 136 138  ?K 3.9   < > 4.1 4.4 3.8  ?CL 104   < > 102 105 109  ?CO2 25   < > 20* 20* 21*  ?BUN 11   < > 48* 41* 30*  ?CREATININE 1.05   < > 2.14*  2.05* 1.59* 1.26*  ?CALCIUM 8.9   < > 8.2* 8.1* 8.2*  ?PROT 6.8  --   --   --   --   ?BILITOT 0.4  --   --   --   --   ?ALKPHOS 114  --   --   --   --   ?ALT 55*  --   --   --   --   ?AST 45*  --   --   --   --   ?GLUCOSE 107*   < > 110* 124* 110*  ? < > = values in this interval not displayed.  ? ?Imaging/Diagnostic Tests: ?No results found. ? ?Wells Guiles, DO ?08/22/2021, 6:00 AM ?PGY-1, [place] ?[place] Intern pager: [phone], text pages welcome ? ?

## 2021-08-22 NOTE — Progress Notes (Signed)
Physical Therapy Treatment ?Patient Details ?Name: [name] ?MRN: [id number] ?DOB: 1946-03-20 ?Today's Date: 08/22/2021 ? ? ?History of Present Illness Pt is a 76 yr old had a witness fall on 3/10  a comminuted displaced vertical basicervical left femoral neck fracture and s/p L THA with an anterior approach. PMH: efib, COPD, GERD, HLD, CAD s/p stent CVA x2  with L side weakness, progressive supranuclear palsy, IBS, MI, PNA ? ?  ?PT Comments  ? ? Pt was more alert and able to participate more with therapy. Pt performed therapeutic exercise and bed mobility to change sheets. During heelslides he was able to push his R knee straight but required assistance to bend his knee. Heel slides were performed on the L LE passively. Pt was able to pull with his left arm to roll right to participate in bed mobility. During treatment session patient showed deficits in strength, endurance, activity tolerance. Recommending therapy services at skilled nursing facility to address the previously stated deficits. Will continue to follow acutely to maximize functional mobility, independence and safety. ?  ?Recommendations for follow up therapy are one component of a multi-disciplinary discharge planning process, led by the attending physician.  Recommendations may be updated based on patient status, additional functional criteria and insurance authorization. ? ?Follow Up Recommendations ? Skilled nursing-short term rehab (<3 hours/day) ?  ?  ?Assistance Recommended at Discharge Frequent or constant Supervision/Assistance  ?Patient can return home with the following Two people to help with walking and/or transfers;Two people to help with bathing/dressing/bathroom;Direct supervision/assist for medications management;Help with stairs or ramp for entrance;Assist for transportation;Assistance with cooking/housework;Direct supervision/assist for financial management;Assistance with feeding ?  ?Equipment Recommendations ? None recommended  by PT  ?  ?Recommendations for Other Services Rehab consult ? ? ?  ?Precautions / Restrictions Precautions ?Precautions: Anterior Hip;Fall ?Restrictions ?Weight Bearing Restrictions: Yes ?LLE Weight Bearing: Weight bearing as tolerated  ?  ? ?Mobility ? Bed Mobility ?Overal bed mobility: Needs Assistance ?Bed Mobility: Rolling ?Rolling: Max assist ?  ?  ?  ?  ?General bed mobility comments: Pt was able to partcipate in rolling by pulling on therapist's hand. ?  ? ?Transfers ?  ?  ?  ?  ?  ?  ?  ?  ?  ?  ?  ? ?Ambulation/Gait ?  ?  ?  ?  ?  ?  ?  ?  ? ? ?Stairs ?  ?  ?  ?  ?  ? ? ?Wheelchair Mobility ?  ? ?Modified Rankin (Stroke Patients Only) ?  ? ? ?  ?Balance   ?  ?  ?  ?  ?  ?  ?  ?  ?  ?  ?  ?  ?  ?  ?  ?  ?  ?  ?  ? ?  ?Cognition Arousal/Alertness: Awake/alert ?Behavior During Therapy: Flat affect ?Overall Cognitive Status: Impaired/Different from baseline ?Area of Impairment: Attention, Memory, Following commands, Safety/judgement, Awareness ?  ?  ?  ?  ?  ?  ?  ?  ?  ?Current Attention Level: Sustained ?  ?Following Commands: Follows one step commands inconsistently, Follows one step commands with increased time ?  ?  ?  ?General Comments: Pt was more alert and focused today. He was not aware of what time it was, he kept saying "why did you guys come this time of night?" ?  ?  ? ?  ?Exercises General Exercises - Lower Extremity ?Ankle Circles/Pumps: 5 reps, Supine,  AAROM, Right, PROM, Left ?Short Arc Quad: AAROM, Right, 5 reps (therapist helps 95%) ? ?  ?General Comments General comments (skin integrity, edema, etc.): Pt's son was in the room observing the session. Pt did not perform any balance activities with physical therapy today. Pt urinted on himself due to condom catheter coming off and therapist helped with hygiene. ?  ?  ? ?Pertinent Vitals/Pain Pain Assessment ?Faces Pain Scale: Hurts whole lot ?Pain Location: Pain is not as bad at rest but seems to be generalized in the L LE with any  movement. ?Pain Descriptors / Indicators: Operative site guarding ?Pain Intervention(s): Limited activity within patient's tolerance  ? ? ?Home Living   ?  ?  ?  ?  ?  ?  ?  ?  ?  ?   ?  ?Prior Function    ?  ?  ?   ? ?PT Goals (current goals can now be found in the care plan section)   ? ?  ?Frequency ? ? ? Min 3X/week ? ? ? ?  ?PT Plan Current plan remains appropriate  ? ? ?Co-evaluation   ?  ?  ?  ?  ? ?  ?AM-PAC PT "6 Clicks" Mobility   ?Outcome Measure ? Help needed turning from your back to your side while in a flat bed without using bedrails?: A Lot ?Help needed moving from lying on your back to sitting on the side of a flat bed without using bedrails?: A Lot ?Help needed moving to and from a bed to a chair (including a wheelchair)?: A Lot ?Help needed standing up from a chair using your arms (e.g., wheelchair or bedside chair)?: A Lot ?Help needed to walk in hospital room?: Total ?Help needed climbing 3-5 steps with a railing? : Total ?6 Click Score: 10 ? ?  ?End of Session   ?Activity Tolerance: Patient limited by pain ?Patient left: with call bell/phone within reach;in bed ?Nurse Communication: Mobility status (Pt needed new condom cath and JP-drained emptied) ?PT Visit Diagnosis: Other symptoms and signs involving the nervous system (R29.898);Pain;History of falling (Z91.81);Muscle weakness (generalized) (M62.81) ?Pain - Right/Left: Left ?Pain - part of body: Hip;Knee ?  ? ? ?Time: 9242-6834 ?PT Time Calculation (min) (ACUTE ONLY): 27 min ? ?Charges:  $Therapeutic Exercise: 8-22 mins ?$Therapeutic Activity: 8-22 mins          ?          ? ?Quenton Fetter, SPT ? ? ? ?Quenton Fetter ?08/22/2021, 5:27 PM ? ?

## 2021-08-22 NOTE — Progress Notes (Signed)
FPTS Brief Progress Note ? ?S: Pt sleeping soundly. No concerns at this time. ? ? ?O: ?BP (!) 141/73 (BP Location: Left Arm)   Pulse 71   Temp 98.1 ?F (36.7 ?C) (Oral)   Resp 19   Ht 6' (1.829 m)   Wt 81 kg   SpO2 96%   BMI 24.22 kg/m?   ?General: lying in bed, NAD ?Respiratory: breathing comfortably on RA ? ?A/P: ?Continue to follow plan outlined in day teams progress note ?- Orders reviewed. Labs for AM ordered, which was adjusted as needed.  ? ? ?Precious Gilding, DO ?08/22/2021, 2:12 AM ?PGY-1, [facility] Night Resident  ?Please page [phone] with questions.  ?  ?

## 2021-08-22 NOTE — Progress Notes (Signed)
Occupational Therapy Treatment ?Patient Details ?Name: [name] ?MRN: [id number] ?DOB: June 17, 1945 ?Today's Date: 08/22/2021 ? ? ?History of present illness Pt is a 76 yr old had a witness fall on 3/10  a comminuted displaced vertical basicervical left femoral neck fracture and s/p L THA with an anterior approach. PMH: efib, COPD, GERD, HLD, CAD s/p stent CVA x2  with L side weakness, progressive supranuclear palsy, IBS, MI, PNA ?  ?OT comments ? Pt. Seen for skilled OT treatment session.  CNA assisted as pt. Requiring 2 person assist. Bed mobility with focus on pt. Utilizing ues to assist. Pt. Required max a and encouragement.  Limited participation as he cont. To perseverate on "why does everything have to hurt so much". Son present and attempting to explain and redirect pt. To participate.  Max a x2 for bed mobility and peri care.  ROM BUES.  Pt. Did better without physical guidance from therapist asst. as it made him more resistive to rom then if he performed on his own.  Continue to progress as pt. Able.  May need to adjust follow up recommendations if pt. Remains a 2 person assist.    ? ?Recommendations for follow up therapy are one component of a multi-disciplinary discharge planning process, led by the attending physician.  Recommendations may be updated based on patient status, additional functional criteria and insurance authorization. ?   ?Follow Up Recommendations ? Home health OT  ?  ?Assistance Recommended at Discharge Frequent or constant Supervision/Assistance  ?Patient can return home with the following ? Two people to help with bathing/dressing/bathroom;Two people to help with walking and/or transfers;Assistance with feeding;Direct supervision/assist for medications management;Direct supervision/assist for financial management;Assist for transportation;Help with stairs or ramp for entrance;Assistance with cooking/housework ?  ?Equipment Recommendations ? None recommended by OT  ?   ?Recommendations for Other Services   ? ?  ?Precautions / Restrictions Precautions ?Precautions: Anterior Hip;Fall ?Restrictions ?LLE Weight Bearing: Weight bearing as tolerated  ? ? ?  ? ?Mobility Bed Mobility ?Overal bed mobility: Needs Assistance ?Bed Mobility: Rolling ?Rolling: Max assist, +2 for physical assistance ?  ?  ?  ?  ?General bed mobility comments: rolled L/R for bed pan removal and clean up ?  ? ?Transfers ?  ?  ?  ?  ?  ?  ?  ?  ?  ?General transfer comment: pt. declined further bed mobilty and attempt at sitting eob ?  ?  ?Balance   ?  ?  ?  ?  ?  ?  ?  ?  ?  ?  ?  ?  ?  ?  ?  ?  ?  ?  ?   ? ?ADL either performed or assessed with clinical judgement  ? ?ADL Overall ADL's : Needs assistance/impaired ?  ?  ?  ?  ?  ?  ?  ?  ?  ?  ?  ?  ?  ?Toilet Transfer Details (indicate cue type and reason): total A +2 bed pan removal (cna provided 2 person assist) ?Toileting- Clothing Manipulation and Hygiene: Total assistance;Bed level;+2 for physical assistance ?  ?  ?  ?  ?General ADL Comments: bed level rolling L/R for bed pan removal and clean up. declined eob.  agreeable to BUE ROM in prep for more functional use, son present. educated on having pt. engage in familiar tasks to increase participation with adls. son reports pt. does reach for things when "hes not thinking about it". ?  ? ?  Extremity/Trunk Assessment   ?  ?  ?  ?  ?  ? ?Vision   ?  ?  ?Perception   ?  ?Praxis   ?  ? ?Cognition Arousal/Alertness: Suspect due to medications, Lethargic ?Behavior During Therapy: Flat affect ?Overall Cognitive Status: Impaired/Different from baseline ?Area of Impairment: Attention, Memory, Following commands, Safety/judgement, Awareness ?  ?  ?  ?  ?  ?  ?  ?  ?  ?Current Attention Level: Sustained ?Memory: Decreased short-term memory ?Following Commands: Follows one step commands inconsistently, Follows one step commands with increased time ?  ?  ?  ?  ?  ?  ?   ?Exercises General Exercises - Upper Extremity ?Elbow  Flexion: PROM, 5 reps, Both, Supine ?Elbow Extension: PROM, 5 reps, Both, Supine ?Wrist Flexion: PROM, 5 reps, Both, Supine ?Wrist Extension: PROM, 5 reps, Both, Supine ? ?  ?Shoulder Instructions   ? ? ?  ?General Comments  Pts. Son expressing/describing his mother being overwhelmed.  "Im worried shes developing a reputation around here".  Provided emotional support and explained that we are all here to work for the patients and their families and no one would be thinking of his mother that way.  Encouraged them to continue with any questions they have and that we are here for them.   ? ? ?Pertinent Vitals/ Pain       Pain Assessment ?Pain Assessment: Faces ?Faces Pain Scale: Hurts whole lot ?Pain Location: pain at the operative site, pain with any movement of the L LE, or attempting to complete rolling ?Pain Descriptors / Indicators: Operative site guarding ?Pain Intervention(s): Limited activity within patient's tolerance, Monitored during session, Repositioned ? ?Home Living   ?  ?  ?  ?  ?  ?  ?  ?  ?  ?  ?  ?  ?  ?  ?  ?  ?  ?  ? ?  ?Prior Functioning/Environment    ?  ?  ?  ?   ? ?Frequency ? Min 3X/week  ? ? ? ? ?  ?Progress Toward Goals ? ?OT Goals(current goals can now be found in the care plan section) ? Progress towards OT goals: Progressing toward goals ? ?   ?Plan Discharge plan remains appropriate   ? ?Co-evaluation ? ? ?   ?  ?  ?  ?  ? ?  ?AM-PAC OT "6 Clicks" Daily Activity     ?Outcome Measure ? ? Help from another person eating meals?: A Lot ?Help from another person taking care of personal grooming?: A Lot ?Help from another person toileting, which includes using toliet, bedpan, or urinal?: Total ?Help from another person bathing (including washing, rinsing, drying)?: Total ?Help from another person to put on and taking off regular upper body clothing?: Total ?Help from another person to put on and taking off regular lower body clothing?: Total ?6 Click Score: 8 ? ?  ?End of Session   ? ?OT  Visit Diagnosis: Unsteadiness on feet (R26.81);Other abnormalities of gait and mobility (R26.89);Muscle weakness (generalized) (M62.81);History of falling (Z91.81);Pain ?Pain - Right/Left: Left ?Pain - part of body: Hip;Leg;Knee;Ankle and joints of foot ?  ?Activity Tolerance Patient limited by lethargy ?  ?Patient Left in bed;with call bell/phone within reach;with bed alarm set;with family/visitor present ?  ?Nurse Communication   ?  ? ?   ? ?Time: 5809-9833 ?OT Time Calculation (min): 17 min ?Charges: OT General Charges ?$OT Visit: 1 Visit ?OT Treatments ?$Therapeutic  Activity: 8-22 mins ? ?Sonia Baller, COTA/L ?Acute Rehabilitation ?[phone]  ? ?Tanya Nones ?08/22/2021, 12:07 PM ?

## 2021-08-22 NOTE — Progress Notes (Signed)
ANTICOAGULATION CONSULT NOTE ? ?Pharmacy Consult for warfarin/enoxaparin  ?Indication: atrial fibrillation ? ?Allergies  ?Allergen Reactions  ? Bactrim [Sulfamethoxazole-Trimethoprim] Anaphylaxis  ?  Ulcers  ? Ciprofloxacin Swelling  ?  Tongue  ? Crestor [Rosuvastatin Calcium] Other (See Comments)  ?  Severe Myalgias  ? ? ?Patient Measurements: ?Height: 6' (182.9 cm) ?Weight: 87 kg (191 lb 12.8 oz) ?IBW/kg (Calculated) : 77.6 ?Heparin Dosing Weight: 81 kg ? ?Vital Signs: ?Temp: 99 ?F (37.2 ?C) (03/17 2836) ?Temp Source: Oral (03/17 0731) ?BP: 137/78 (03/17 0731) ?Pulse Rate: 72 (03/17 0731) ? ?Labs: ?Recent Labs  ?  08/20/21 ?0236 08/20/21 ?0600 08/20/21 ?1425 08/21/21 ?0621 08/22/21 ?0243  ?HGB 10.3*  --   --  9.8* 9.5*  ?HCT 30.4*  --   --  29.7* 28.0*  ?PLT 213  --   --  195 204  ?LABPROT 19.3*  --   --  19.8* 19.4*  ?INR 1.6*  --   --  1.7* 1.6*  ?CREATININE  --    < > 2.14*  2.05* 1.59* 1.26*  ? < > = values in this interval not displayed.  ? ? ? ?Estimated Creatinine Clearance: 55.6 mL/min (A) (by C-G formula based on SCr of 1.26 mg/dL (H)). ? ? ?Medical History: ?Past Medical History:  ?Diagnosis Date  ? BPH (benign prostatic hyperplasia)   ? COPD (chronic obstructive pulmonary disease) ([place]) 2021  ? Coronary artery disease   ? a. s/p PCI to LCx in 2007; b. MV 2017 no sig ischemia, EF 59%, nl study. c. Cath 01/2020 without significant recurrent dz.  ? Depression   ? GERD (gastroesophageal reflux disease)   ? Hyperlipidemia   ? Hypertension   ? IBS (irritable bowel syndrome)   ? Myocardial infarction [facility])   ? 12-13 yrs ago   ? PAF (paroxysmal atrial fibrillation) ([place])   ? a. CHADS2VASc => 5 (HTN, age x 1, stroke x 2, vascular disease); b. on Coumadin   ? PNA (pneumonia)   ? Skin cancer   ? face   ? Sleep apnea   ? off cpap   ? Stroke [facility])   ? a. x 2 with residual left-sided weakness  ?  ? ? ?Assessment: ?60 yom who admitted after fall resulting in left femoral neck fracture. He is on warfarin PTA for PAF.   ? ?Patient's Hgb initially dropped s/p left total hip arthroplasty (possibly as a result of hemodilution) but is now stable 9s. PLT stable at 204. INR Subtherapeutic at 1.6 with last warfarin dose 3/14 PM. ? ?RN does note that there is some bloody output from the JP drain, but after discussion with MD we will proceed with enoxaparin and warfarin administration.  ? ?PTA warfarin 7.'5mg'$  on Sun/Thurs, 3.'75mg'$  all other days  ? ?Goal of Therapy:  ?Heparin level 0.3-0.7 units/ml ?Monitor platelets by anticoagulation protocol: Yes ?  ?Plan:  ?Continue enoxaparin 80 mg [facility] Q12H  ?Warfarin '5mg'$  x1  ?Monitor for signs/symptoms of bleeding, renal function ?F/U warfarin restart   ? ?Adria Dill, PharmD ?PGY-1 Acute Care Resident  ?08/22/2021 11:05 AM  ? ? ?

## 2021-08-23 DIAGNOSIS — S72002D Fracture of unspecified part of neck of left femur, subsequent encounter for closed fracture with routine healing: Secondary | ICD-10-CM | POA: Diagnosis not present

## 2021-08-23 LAB — CBC
HCT: 26.9 % — ABNORMAL LOW (ref 39.0–52.0)
Hemoglobin: 8.9 g/dL — ABNORMAL LOW (ref 13.0–17.0)
MCH: 30.8 pg (ref 26.0–34.0)
MCHC: 33.1 g/dL (ref 30.0–36.0)
MCV: 93.1 fL (ref 80.0–100.0)
Platelets: 231 10*3/uL (ref 150–400)
RBC: 2.89 MIL/uL — ABNORMAL LOW (ref 4.22–5.81)
RDW: 14 % (ref 11.5–15.5)
WBC: 7.8 10*3/uL (ref 4.0–10.5)
nRBC: 0 % (ref 0.0–0.2)

## 2021-08-23 LAB — BASIC METABOLIC PANEL
Anion gap: 10 (ref 5–15)
BUN: 20 mg/dL (ref 8–23)
CO2: 21 mmol/L — ABNORMAL LOW (ref 22–32)
Calcium: 8.3 mg/dL — ABNORMAL LOW (ref 8.9–10.3)
Chloride: 110 mmol/L (ref 98–111)
Creatinine, Ser: 1.08 mg/dL (ref 0.61–1.24)
GFR, Estimated: >60 mL/min (ref >60)
Glucose, Bld: 124 mg/dL — ABNORMAL HIGH (ref 70–99)
Potassium: 3.7 mmol/L (ref 3.5–5.1)
Sodium: 141 mmol/L (ref 135–145)

## 2021-08-23 LAB — PROTIME-INR
INR: 1.4 — ABNORMAL HIGH (ref 0.8–1.2)
Prothrombin Time: 17.2 seconds — ABNORMAL HIGH (ref 11.4–15.2)

## 2021-08-23 MED ORDER — WARFARIN SODIUM 6 MG PO TABS
6.0000 mg | ORAL_TABLET | Freq: Once | ORAL | Status: DC
  Filled 2021-08-23: qty 1

## 2021-08-23 NOTE — Progress Notes (Signed)
FPTS Brief Progress Note ? ?S: Pt sleeping soundly. No concerns at this time. ? ? ?O: ?BP (!) 124/47 (BP Location: Left Arm)   Pulse 73   Temp 98.6 ?F (37 ?C) (Oral)   Resp 17   Ht 6' (1.829 m)   Wt 87 kg   SpO2 97%   BMI 26.01 kg/m?   ?General: lying in bed, NAD ?Respiratory: breathing comfortably on RA ? ?A/P: ?Continue to follow plan outlined in day teams progress note ?- Orders reviewed. Labs for AM ordered, which was adjusted as needed.  ? ? ?Precious Gilding, DO ?08/23/2021, 1:45 AM ?PGY-1, [facility] Night Resident  ?Please page [phone] with questions.  ? ? ?

## 2021-08-23 NOTE — Progress Notes (Signed)
ANTICOAGULATION CONSULT NOTE - Follow Up Consult ? ?Pharmacy Consult for Warfarin ?Indication: atrial fibrillation ? ?Allergies  ?Allergen Reactions  ? Bactrim [Sulfamethoxazole-Trimethoprim] Anaphylaxis  ?  Ulcers  ? Ciprofloxacin Swelling  ?  Tongue  ? Crestor [Rosuvastatin Calcium] Other (See Comments)  ?  Severe Myalgias  ? ? ?Patient Measurements: ?Height: 6' (182.9 cm) ?Weight: 87 kg (191 lb 12.8 oz) ?IBW/kg (Calculated) : 77.6 ?Enoxaparin Dosing Weight: 87 kg ? ?Vital Signs: ?Temp: 97.5 ?F (36.4 ?C) (03/18 0756) ?Temp Source: Oral (03/18 0756) ?BP: 145/80 (03/18 0756) ?Pulse Rate: 75 (03/18 0756) ? ?Labs: ?Recent Labs  ?  08/21/21 ?0621 08/22/21 ?0243 08/23/21 ?0301  ?HGB 9.8* 9.5* 8.9*  ?HCT 29.7* 28.0* 26.9*  ?PLT 195 204 231  ?LABPROT 19.8* 19.4* 17.2*  ?INR 1.7* 1.6* 1.4*  ?CREATININE 1.59* 1.26* 1.08  ? ? ?Estimated Creatinine Clearance: 64.9 mL/min (by C-G formula based on SCr of 1.08 mg/dL). ? ?Assessment: ?71 yom who admitted after fall resulting in left femoral neck fracture. He is on warfarin PTA for PAF.  ?  ? INR is subtherapeutic at 1.4, down from 1.6 yesterday and warfarin 5 mg given. Warfarin held 3/15 and 3/16 in case procedure needed. On Enoxaparin bridge. Hgb initially dropped s/p left total hip arthroplasty (possibly as a result of hemodilution), then 9s>8.9 today. Platelets 231.  On Amiodarone 200 mg daily as PTA. ? ?RN reports minimal bloody output from the JP drain. ?  ?PTA warfarin 7.'5mg'$  on Sun/Thurs, 3.'75mg'$  all other days  ? ?Goal of Therapy:  ?INR 2-3 ?Anti-Xa level 0.6-1 units/ml 4hrs after LMWH dose given ?Monitor platelets by anticoagulation protocol: Yes ?  ?Plan:  ?Warfarin 6 mg PO x 1 today. ?Continue Enoxaparin 80 mg SQ q12h. ?Daily PT/INR. ?Stop Enoxaparin when INR >2 ?Monitor for bleeding, monitor drain output. ? ?Arty Baumgartner, RPh ?08/23/2021,11:48 AM ? ? ?

## 2021-08-23 NOTE — Progress Notes (Addendum)
Family Medicine Teaching Service ?Daily Progress Note ?Intern Pager: [phone] ? ?Patient name: Jaidyn Kuhl Medical record number: [id number] ?Date of birth: 1945/08/21 Age: 76 y.o. Gender: male ? ?Primary Care Provider: [name], MD ?Consultants: Ortho ?Code Status: DNR ? ?Pt Overview and Major Events to Date:  ?3/11: admitted for left femoral fracture ?3/13: Left total hip arthroplasty ? ?Assessment and Plan: ?Hendrix Yurkovich is a 76 year old male presenting with recurrent falls and consequent left femoral fracture now s/p left total hip arthroplasty.  Past medical history significant for progressive supranuclear palsy, paroxysmal A-fib on Coumadin, CAD s/p stent (2017), MI (2007), CVA x2, COPD, GERD, hypertension, hyperlipidemia, BPH. ? ?Left femoral neck fracture status post left total hip arthroplasty: Stable ?Patient endorsed some pain overnight, appeared comfortable when I saw him this morning. Minimal blood in drain this morning. Yesterday had documented 170 mL of drainage. Will reach out to ortho since we have not seen a note since the operation to have them assess patient and advise about when to pull drain, etc.  ?-Continue Oxy IR 5 mg every 8 hours, Oxy IR 2.5 mg every 6 hours as needed, Tylenol 650 mg every 6 hours ?-PT/OT eval  ?-f/u with Ortho  ? ?Acute blood loss anemia  ?Hgb 8.9 down from 9.5 yesterday. Steadily decreasing since the surgery.  ?- will monitor closely ?- am CBC ? ?Constipation, chronic and stable ?Had 1 BM yesterday and the day prior.  ?- continue Miralax and Senna daily  ? ?Paroxysmal Afib  ?HR stable at 75 bpm this morning. Last 24 hrs ranged from 71-75 bpm.  ?- continue Amiodarone ? ?AKI, Resolved ?Cr 1.08 improved from 1.26 yesterday, likely was pre renal given improvement after fluids yesterday ? ?FEN/GI: heart healthy ?PPx: Lovenox  ? ?Disposition: Home with HH pending drain output and anemia  ? ?Subjective:  ?No acute events overnight. Patient states he had some pain  overnight but was feeling a little better this morning. Denied any questions or concerns.  ? ?Objective: ?Temp:  [97.5 ?F (36.4 ?C)-98.9 ?F (37.2 ?C)] 97.5 ?F (36.4 ?C) (03/18 0756) ?Pulse Rate:  [71-75] 75 (03/18 0756) ?Resp:  [17-18] 18 (03/18 0756) ?BP: (124-145)/(47-80) 145/80 (03/18 0756) ?SpO2:  [97 %-100 %] 97 % (03/18 0756) ?Physical Exam: ?General: Resting comfortably in bed. NAD ?Cardiovascular: RRR no murmurs  ?Respiratory: CTAB normal WOB ?Abdomen: soft, non distended ?Extremities: Surgical site with clean bandage without surrounding erythema. Minimal blood in drainage bulb  ? ?Laboratory: ?Recent Labs  ?Lab 08/21/21 ?0621 08/22/21 ?0243 08/23/21 ?0301  ?WBC 9.2 7.7 7.8  ?HGB 9.8* 9.5* 8.9*  ?HCT 29.7* 28.0* 26.9*  ?PLT 195 204 231  ? ?Recent Labs  ?Lab 08/21/21 ?0621 08/22/21 ?0243 08/23/21 ?0301  ?NA 136 138 141  ?K 4.4 3.8 3.7  ?CL 105 109 110  ?CO2 20* 21* 21*  ?BUN 41* 30* 20  ?CREATININE 1.59* 1.26* 1.08  ?CALCIUM 8.1* 8.2* 8.3*  ?GLUCOSE 124* 110* 124*  ? ? ? ?Imaging/Diagnostic Tests: ?None new  ? ?Shary Key, DO ?08/23/2021, 10:50 AM ?PGY-2, [place] ?[facility] Intern pager: [phone], text pages welcome  ?

## 2021-08-23 NOTE — Progress Notes (Signed)
FPTS Brief Progress Note ? ?S Saw patient at bedside this evening. Patient was sleeping comfortably. I did not wake the patient. ? ?O: ?BP 117/71 (BP Location: Left Arm)   Pulse 71   Temp 97.8 ?F (36.6 ?C) (Oral)   Resp 18   Ht 6' (1.829 m)   Wt 87 kg   SpO2 96%   BMI 26.01 kg/m?   ? ?General: sleeping,no acute distress ?Cardio: well perfused  ?Pulm: normal work of breathing ? ?A/P: ?Plan per day team  ?- Orders reviewed. Labs for AM ordered, which was adjusted as needed.  ?- If condition changes, plan includes orthopedics consult, RBC transfusion.  ? ?Lattie Haw, MD ?08/23/2021, 10:52 PM ?PGY-3, [place] Night Resident  ?Please page [phone] with questions.   ?

## 2021-08-23 NOTE — Plan of Care (Signed)

## 2021-08-24 DIAGNOSIS — S72002D Fracture of unspecified part of neck of left femur, subsequent encounter for closed fracture with routine healing: Secondary | ICD-10-CM | POA: Diagnosis not present

## 2021-08-24 LAB — BASIC METABOLIC PANEL
Anion gap: 8 (ref 5–15)
BUN: 15 mg/dL (ref 8–23)
CO2: 23 mmol/L (ref 22–32)
Calcium: 8.2 mg/dL — ABNORMAL LOW (ref 8.9–10.3)
Chloride: 106 mmol/L (ref 98–111)
Creatinine, Ser: 0.96 mg/dL (ref 0.61–1.24)
GFR, Estimated: >60 mL/min (ref >60)
Glucose, Bld: 144 mg/dL — ABNORMAL HIGH (ref 70–99)
Potassium: 3.7 mmol/L (ref 3.5–5.1)
Sodium: 137 mmol/L (ref 135–145)

## 2021-08-24 LAB — CBC
HCT: 27.9 % — ABNORMAL LOW (ref 39.0–52.0)
Hemoglobin: 9.2 g/dL — ABNORMAL LOW (ref 13.0–17.0)
MCH: 31 pg (ref 26.0–34.0)
MCHC: 33 g/dL (ref 30.0–36.0)
MCV: 93.9 fL (ref 80.0–100.0)
Platelets: 242 10*3/uL (ref 150–400)
RBC: 2.97 MIL/uL — ABNORMAL LOW (ref 4.22–5.81)
RDW: 14 % (ref 11.5–15.5)
WBC: 8.6 10*3/uL (ref 4.0–10.5)
nRBC: 0 % (ref 0.0–0.2)

## 2021-08-24 LAB — PROTIME-INR
INR: 1.4 — ABNORMAL HIGH (ref 0.8–1.2)
Prothrombin Time: 17.3 seconds — ABNORMAL HIGH (ref 11.4–15.2)

## 2021-08-24 MED ORDER — OXYCODONE HCL 5 MG PO TABS
5.0000 mg | ORAL_TABLET | Freq: Four times a day (QID) | ORAL | Status: DC
  Administered 2021-08-24 – 2021-08-28 (×15): 5 mg via ORAL
  Filled 2021-08-24 (×16): qty 1

## 2021-08-24 MED ORDER — TIZANIDINE HCL 4 MG PO TABS
4.0000 mg | ORAL_TABLET | Freq: Once | ORAL | Status: AC
  Administered 2021-08-24: 4 mg via ORAL
  Filled 2021-08-24: qty 1

## 2021-08-24 NOTE — Progress Notes (Signed)
Family Medicine Teaching Service ?Daily Progress Note ?Intern Pager: [phone] ? ?Patient name: [name] Medical record number: [id number] ?Date of birth: 31-May-1946 Age: 76 y.o. Gender: male ? ?Primary Care Provider: [name], MD ?Consultants: Ortho ?Code Status: DNR ? ?Pt Overview and Major Events to Date:  ?3/11: admitted for left femoral fracture ?3/13: Left total hip arthroplasty ? ?Assessment and Plan: ?[name] is a 76 year old male presenting with recurrent falls and consequent left femoral fracture now s/p left total hip arthroplasty. Past medical history significant for progressive supranuclear palsy, paroxysmal A-fib on Coumadin, CAD s/p stent (2017), MI (2007), CVA x2, COPD, GERD, hypertension, hyperlipidemia, BPH. ? ?Left femoral neck fracture s/p left total hip arthroplasty: stable ?Discussed hip drain with ortho who advised that patient follow up outpatient for removal.  ?-continue Oxy IR '5mg'$  q8h, Oxy IR 2.'5mg'$  q6h prn, Tylenol '650mg'$  q6h ?-PT/OT eval and treat ? ?Acute blood loss anemia ?Hgb improved from 8.9 to 9.2. 62m drainage in the last 24 hours. Warfarin discontinued after further conversation with wife yesterday. They are amenable to further discussion in the outpatient setting. ?-AM CBC for monitoring hgb ? ?Leg spasms ?Pt endorsed pain this morning due to leg spasms. Concerned that medications may cause sedation but pt states he'd rather have medication for his leg pain and be sedated rather than be awake with the current pain he's having. He takes Zanaflex and Mirapex at home as needed for leg spasms.  ?-Zanaflex '4mg'$  once ? ?FEN/GI: regular ?PPx: Lovenox ?Dispo:Home with home health  today. Barriers include none.  ? ?Subjective:  ?Patient states he is having pain from his leg spasms at this time and is requesting medications.  Awake and alert to self and year but not day, month, location.  He states this has been a difficult road with his hip injury and he had hoped it  would get better sooner than he expected. ? ?Objective: ?Temp:  [97.5 ?F (36.4 ?C)-98.4 ?F (36.9 ?C)] 98.4 ?F (36.9 ?C) (03/19 0741) ?Pulse Rate:  [66-75] 68 (03/19 0741) ?Resp:  [18-19] 18 (03/19 0741) ?BP: (117-145)/(71-91) 127/73 (03/19 0741) ?SpO2:  [96 %-97 %] 97 % (03/19 0741) ?Physical Exam: ?General: Awake, alert to self and partially to time, NAD but uncomfortable appearing ?Cardiovascular: RRR, no murmurs auscultated ?Respiratory: CTA B, normal work of breathing ?Abdomen: Soft, minimally distended, normoactive bowel sounds ?Extremities: Surgical site with clean bandage without surrounding erythema, no blood in the drainage bulb, 2+ pedal pulses ? ?Laboratory: ?Recent Labs  ?Lab 08/22/21 ?0243 08/23/21 ?0301 08/24/21 ?0215  ?WBC 7.7 7.8 8.6  ?HGB 9.5* 8.9* 9.2*  ?HCT 28.0* 26.9* 27.9*  ?PLT 204 231 242  ? ?Recent Labs  ?Lab 08/22/21 ?0243 08/23/21 ?0301 08/24/21 ?0215  ?NA 138 141 137  ?K 3.8 3.7 3.7  ?CL 109 110 106  ?CO2 21* 21* 23  ?BUN 30* 20 15  ?CREATININE 1.26* 1.08 0.96  ?CALCIUM 8.2* 8.3* 8.2*  ?GLUCOSE 110* 124* 144*  ? ?Imaging/Diagnostic Tests: ?No results found. ? ?DWells Guiles DO ?08/24/2021, 7:54 AM ?PGY-1, [facility] ?[facility]Intern pager: [phone] text pages welcome ? ?

## 2021-08-24 NOTE — TOC Progression Note (Signed)
Transition of Care (TOC) - Progression Note  ? ? ?Patient Details  ?Name: [name] ?MRN: [id number] ?Date of Birth: 07/30/1945 ? ?Transition of Care (TOC) CM/SW Contact  ?Bartholomew Crews, RN ?Phone Number: [phone] ?08/24/2021, 4:52 PM ? ?Clinical Narrative:    ? ?Spoke with wife at bedside and son, Claus, on speaker phone to discuss post acute transition. Home demographics and phone numbers verified. Ideally wife [name]) would like to have patient at home where she can provide 24/7 accompaniment, but she recognizes that she cannot do this alone. [name] requests that their son, Jewelz, be included in all transition of care conversations/decisions.  ? ?Discussed home health services including limitations. Patient has been [facility] with [facility] PT, OT, Aide, and SW by previous NCM.  ? ?Discussed private pay caregivers. Patient is in process of being assessed for [facility] services through [facility], however, he was unable to keep appointment d/t current hospitalization.  ? ?Discussed potential SNF options and reviewed available SNFs in a 25 mi radius of home location. Family is unsure of this option d/t poor quality Medicare ratings of most SNFs. Medicare.gov SNF comparison provided to [facility] at bedside.  ? ?Patient has all DME needed including hospital bed, motorized sit-stand, walkers, and 3/1. Patient also has a walk in shower.  ? ?Patient has parkisonian like neurodegenerative disease which qualifies for SSI, but family unsure of how to access. Discussed contacting social security. Will ask FC.  ? ?Family requested face to face meeting at bedside tomorrow late morning.  ? ?TOC following for transition needs.  ? ? ?Expected Discharge Plan: [place] (declined SNF placement) ?Barriers to Discharge: Continued Medical Work up ? ?Expected Discharge Plan and Services ?Expected Discharge Plan: [place] (declined SNF placement) ?  ?Discharge Planning Services: CM Consult ?  ?  ?                 ?  ?  ?  ?  ?  ?HH Arranged: PT, OT, Social Work, Nurse's Aide ?[place] Agency: [facility] ?Date HH Agency Contacted: 08/21/21 ?Time [place]: 0240 ?Representative spoke with at [place]: Tommi Rumps ? ? ?Social Determinants of Health (SDOH) Interventions ?  ? ?Readmission Risk Interventions ?No flowsheet data found. ? ?

## 2021-08-24 NOTE — Plan of Care (Signed)

## 2021-08-25 ENCOUNTER — Inpatient Hospital Stay (HOSPITAL_COMMUNITY): Payer: PPO

## 2021-08-25 LAB — CBC
HCT: 27.2 % — ABNORMAL LOW (ref 39.0–52.0)
Hemoglobin: 9.2 g/dL — ABNORMAL LOW (ref 13.0–17.0)
MCH: 31.6 pg (ref 26.0–34.0)
MCHC: 33.8 g/dL (ref 30.0–36.0)
MCV: 93.5 fL (ref 80.0–100.0)
Platelets: 258 K/uL (ref 150–400)
RBC: 2.91 MIL/uL — ABNORMAL LOW (ref 4.22–5.81)
RDW: 14.2 % (ref 11.5–15.5)
WBC: 8 K/uL (ref 4.0–10.5)
nRBC: 0 % (ref 0.0–0.2)

## 2021-08-25 LAB — PROTIME-INR
INR: 1.4 — ABNORMAL HIGH (ref 0.8–1.2)
Prothrombin Time: 17.6 seconds — ABNORMAL HIGH (ref 11.4–15.2)

## 2021-08-25 MED ORDER — ENOXAPARIN SODIUM 40 MG/0.4ML IJ SOSY
40.0000 mg | PREFILLED_SYRINGE | INTRAMUSCULAR | Status: DC
  Administered 2021-08-25 – 2021-08-28 (×4): 40 mg via SUBCUTANEOUS
  Filled 2021-08-25 (×4): qty 0.4

## 2021-08-25 NOTE — Progress Notes (Signed)
Family Medicine Teaching Service ?Daily Progress Note ?Intern Pager: [phone] ? ?Patient name: [name] Medical record number: [id number] ?Date of birth: [birth year] Age: 76 y.o. Gender: male ? ?Primary Care Provider: [name], MD ?Consultants: Ortho (S/O) ?Code Status: DNR ? ?Pt Overview and Major Events to Date:  ?3/11: admitted for left femoral fracture ?3/13: Left total hip arthroplasty ? ?Assessment and Plan: ?[name] is a 76 year old male presenting with recurrent falls and consequent left femoral fracture now s/p left total hip arthroplasty. Past medical history significant for progressive supranuclear palsy, paroxysmal A-fib on Coumadin, CAD s/p stent (2017), MI (2007), CVA x2, COPD, GERD, hypertension, hyperlipidemia, BPH. ? ?Femoral neck fracture status post left total hip arthroplasty: Stable  challenging disposition ?Difficult disposition given patient does not qualify for CIR and family is not amenable to SNF. His pain regimen was adjusted yesterday and patient states that his pain is tolerable at this time.  The pain is controlled when the patient is not moving but during movement is when it aggravates him most. ?-Continue Oxy IR 5 mg every 6 hours, Tylenol 650 mg every 6 hours, Oxy IR 2.5 mg every 6 hours as needed ?-PT/OT eval and treat ?-Family meeting at noon ? ?Acute blood loss anemia: Stable ?Hemoglobin stable at 9.2.  Total 45 mL of drainage yesterday.  Patient will need to follow-up outpatient with orthopedics for removal of left hip drain. Warfarin continues to be discontinued pending further conversation. ?-A.m. CBC for monitoring hemoglobin ?-restart Lovenox for DVT ppx ? ?Leg spasms: Stable ?Patient states leg spasms have not bothered him recently. ?-Consider Zanaflex 4 mg as needed for leg spasms ? ?FEN/GI: Regular ?PPx: Lovenox ?Dispo:Home with home health . Barriers include family meeting.  ? ?Subjective:  ?Patient states he just had bowel movement but had pain in his  left hip when trying to move.  His pain only bothers him when he has to move.  He is unaware that there was going to be a family meeting today. ? ?Objective: ?Temp:  [98.2 ?F (36.8 ?C)-98.4 ?F (36.9 ?C)] 98.2 ?F (36.8 ?C) (03/20 0746) ?Pulse Rate:  [66-70] 70 (03/20 0746) ?Resp:  [17] 17 (03/20 0746) ?BP: (132-140)/(76-83) 140/76 (03/20 0746) ?SpO2:  [97 %] 97 % (03/20 0746) ?Physical Exam: ?General: Awake, alert to person and partially time not place, NAD ?Cardiovascular: RRR, no murmurs auscultated ?Respiratory: CTA B, normal work of breathing ?Abdomen: Soft, minimally distended, normoactive bowel sounds ?Extremities: Surgical site with clean bandage without surrounding erythema, trace blood in the drainage bulb, 2+ pedal pulses, SCDs in place ? ?Laboratory: ?Recent Labs  ?Lab 08/23/21 ?0301 08/24/21 ?0215 08/25/21 ?0252  ?WBC 7.8 8.6 8.0  ?HGB 8.9* 9.2* 9.2*  ?HCT 26.9* 27.9* 27.2*  ?PLT 231 242 258  ? ?Recent Labs  ?Lab 08/22/21 ?0243 08/23/21 ?0301 08/24/21 ?0215  ?NA 138 141 137  ?K 3.8 3.7 3.7  ?CL 109 110 106  ?CO2 21* 21* 23  ?BUN 30* 20 15  ?CREATININE 1.26* 1.08 0.96  ?CALCIUM 8.2* 8.3* 8.2*  ?GLUCOSE 110* 124* 144*  ? ?Imaging/Diagnostic Tests: ?No results found. ? ?Wells Guiles, DO ?08/25/2021, 9:14 AM ?PGY-1, [place] [facility] ?[place] Intern pager: [phone], text pages welcome ? ?

## 2021-08-25 NOTE — Progress Notes (Signed)
FPTS Brief Progress Note ? ?S: Patient seen resting comfortably in bed.  Patient was sleeping so did not awaken.  Overnight nurse expressed no concerns at this time. ? ? ?O: ?BP 127/73 (BP Location: Left Arm)   Pulse 68   Temp 98.4 ?F (36.9 ?C) (Oral)   Resp 18   Ht 6' (1.829 m)   Wt 87 kg   SpO2 97%   BMI 26.01 kg/m?   ? ? ?A/P: ?-Plan per day team ?- Orders reviewed. Labs for AM ordered, which was adjusted as needed.  ?- If condition changes, plan includes bedside assessment.  ? ?France Ravens, MD ?08/25/2021, 12:02 AM ?PGY-1, [place] Medicine Night Resident  ?Please page [phone] with questions.  ?  ?

## 2021-08-25 NOTE — Progress Notes (Signed)
FPTS Brief Progress Note ? ?S: Patient seen resting comfortably in bed.  Patient was asleep so did not awaken.  Overnight nurse did not report any concerns at this time. ? ? ?O: ?BP 140/70 (BP Location: Left Arm)   Pulse 67   Temp 98.6 ?F (37 ?C) (Oral)   Resp 17   Ht 6' (1.829 m)   Wt 87 kg   SpO2 97%   BMI 26.01 kg/m?   ? ? ?A/P: ?- Plans per day team. ?- Orders reviewed. Labs for AM ordered, which was adjusted as needed.  ?- If condition changes, plan includes bedside evaluation.  ? ?France Ravens, MD ?08/25/2021, 9:39 PM ?PGY-1, [place] Medicine Night Resident  ?Please page [phone] with questions.  ? ?

## 2021-08-25 NOTE — Progress Notes (Signed)
OT Cancellation Note ? ?Patient Details ?Name: [name] ?MRN: [id number] ?DOB: 1945-12-03 ? ? ?Cancelled Treatment:    Reason Eval/Treat Not Completed: Patient declined, no reason specified Patient family declining therapy attempt, stating, "He's in a lot of pain, and if you attempt now I will get angry." Advocated the importance of therapy and continued movement, family endorsing attempting tomorrow (3/21). Therapy will follow back at time permits.  ? ?Corinne Ports E. Kateryna Grantham, OTR/L ?Acute Rehabilitation Services ?[phone] ?[phone]  ? ?Corinne Ports Brinn Westby ?08/25/2021, 3:17 PM ?

## 2021-08-25 NOTE — Social Work (Signed)
Please be advised that the above-named patient will require a short-term nursing home stay-anticipated 30 days or less for rehabilitation and strengthening. The plan is for return home.  

## 2021-08-25 NOTE — NC FL2 (Signed)
?[place] MEDICAID FL2 LEVEL OF CARE SCREENING TOOL  ?  ? ?IDENTIFICATION  ?Patient Name: ?[name] Birthdate: 1946/03/24 Sex: male Admission Date (Current Location): ?08/15/2021  ?[place] and [place] Number: ? [place] ?  Facility and Address:  ?[facility], [place] [address] [place], [place] [address] ?     Provider Number: ?[id number]  ?Attending Physician Name and Address:  ?Leeanne Rio, MD ? Relative Name and Phone Number:  ?Jayesh Marbach, [phone] ?   ?Current Level of Care: ?Hospital Recommended Level of Care: ?[place] Prior Approval Number: ?  ? ?Date Approved/Denied: ?  PASRR Number: ?Level 2 ? ?Discharge Plan: ?SNF ?  ? ?Current Diagnoses: ?Patient Active Problem List  ? Diagnosis Date Noted  ? AKI (acute kidney injury) ([place])   ? Status post total hip replacement, left   ? Closed fracture of neck of left femur ([place]) 08/16/2021  ? Leg fracture, left 08/16/2021  ? Insect bite 04/15/2021  ? Drug-induced tremor 03/20/2021  ? Depression, recurrent ([place]) 01/18/2021  ? Urinary frequency 01/18/2021  ? Partial small bowel obstruction ([place]) 01/13/2021  ? Thyroid nodule 12/23/2020  ? Suspected Movement disorder- possibly PD  12/22/2020  ? Hematoma- Paraspinal from Fall  12/22/2020  ? Frequent falls 11/13/2020  ? Orthostatic dizziness 08/28/2020  ? Statin myopathy 06/07/2019  ? Chronic obstructive pulmonary disease ([place]) 06/13/2018  ? Sleep apnea 09/16/2017  ? Constipation 07/21/2017  ? GERD (gastroesophageal reflux disease) 02/08/2015  ? IBS (irritable bowel syndrome) 02/08/2015  ? Essential hypertension 02/08/2015  ? AF (paroxysmal atrial fibrillation) ([place]) 01/23/2015  ? CAD (coronary artery disease) 01/23/2015  ? History of CVA (cerebrovascular accident) 01/23/2015  ? ? ?Orientation RESPIRATION BLADDER Height & Weight   ?  ?Self, Situation, Place ? Normal Incontinent, External catheter Weight: 191 lb 12.8 oz (87 kg) ?Height:  6' (182.9 cm)  ?BEHAVIORAL  SYMPTOMS/MOOD NEUROLOGICAL BOWEL NUTRITION STATUS  ?    Incontinent Diet (See DC summary)  ?AMBULATORY STATUS COMMUNICATION OF NEEDS Skin   ?Extensive Assist Verbally Surgical wounds, Skin abrasions ?  ?  ?  ?    ?     ?     ? ? ?Personal Care Assistance Level of Assistance  ?Bathing, Feeding, Dressing Bathing Assistance: Maximum assistance ?Feeding assistance: Limited assistance ?Dressing Assistance: Maximum assistance ?   ? ?Functional Limitations Info  ?Sight, Speech, Hearing Sight Info: Impaired ?Hearing Info: Adequate ?Speech Info: Adequate  ? ? ?SPECIAL CARE FACTORS FREQUENCY  ?PT (By licensed PT), OT (By licensed OT)   ?  ?PT Frequency: 5x week ?OT Frequency: 5x week ?  ?  ?  ?   ? ? ?Contractures Contractures Info: Not present  ? ? ?Additional Factors Info  ?Code Status, Allergies, Psychotropic Code Status Info: DNR ?Allergies Info: Bactrim (Sulfamethoxazole-trimethoprim)   Ciprofloxacin   Crestor (Rosuvastatin Calcium ?Psychotropic Info: Citalopram ?  ?  ?   ? ?Current Medications (08/25/2021):  This is the current hospital active medication list ?Current Facility-Administered Medications  ?Medication Dose Route Frequency Provider Last Rate Last Admin  ? acetaminophen (TYLENOL) tablet 650 mg  650 mg Oral Q6H France Ravens, MD   650 mg at 08/25/21 0946  ? albuterol (PROVENTIL) (2.5 MG/3ML) 0.083% nebulizer solution 2.5 mg  2.5 mg Nebulization Q4H PRN Wells Guiles, DO      ? amiodarone (PACERONE) tablet 200 mg  200 mg Oral Daily Dameron, Marisa, DO   200 mg at 08/25/21 0946  ? amLODipine (NORVASC) tablet  2.5 mg  2.5 mg Oral Daily Rod Can, MD   2.5 mg at 08/25/21 0946  ? bisoprolol (ZEBETA) tablet 5 mg  5 mg Oral Daily Rod Can, MD   5 mg at 08/25/21 0946  ? citalopram (CELEXA) tablet 20 mg  20 mg Oral Daily Rod Can, MD   20 mg at 08/25/21 0946  ? enoxaparin (LOVENOX) injection 40 mg  40 mg Subcutaneous Q24H Wells Guiles, DO   40 mg at 08/25/21 1320  ? guaiFENesin-dextromethorphan  (ROBITUSSIN DM) 100-10 MG/5ML syrup 5 mL  5 mL Oral Q4H PRN Leeanne Rio, MD   5 mL at 08/25/21 0540  ? menthol-cetylpyridinium (CEPACOL) lozenge 3 mg  1 lozenge Oral PRN Swinteck, Aaron Edelman, MD      ? Or  ? phenol (CHLORASEPTIC) mouth spray 1 spray  1 spray Mouth/Throat PRN Swinteck, Aaron Edelman, MD      ? metoCLOPramide (REGLAN) tablet 5-10 mg  5-10 mg Oral Q8H PRN Swinteck, Aaron Edelman, MD      ? Or  ? metoCLOPramide (REGLAN) injection 5-10 mg  5-10 mg Intravenous Q8H PRN Swinteck, Aaron Edelman, MD      ? ondansetron (ZOFRAN) tablet 4 mg  4 mg Oral Q6H PRN Rod Can, MD      ? Or  ? ondansetron (ZOFRAN) injection 4 mg  4 mg Intravenous Q6H PRN Swinteck, Aaron Edelman, MD      ? oxyCODONE (Oxy IR/ROXICODONE) immediate release tablet 2.5 mg  2.5 mg Oral Q6H PRN Wells Guiles, DO   2.5 mg at 08/24/21 2133  ? oxyCODONE (Oxy IR/ROXICODONE) immediate release tablet 5 mg  5 mg Oral Q6H Leeanne Rio, MD   5 mg at 08/25/21 1319  ? pantoprazole (PROTONIX) EC tablet 40 mg  40 mg Oral Daily Rod Can, MD   40 mg at 08/25/21 0946  ? polyethylene glycol (MIRALAX / GLYCOLAX) packet 17 g  17 g Oral Daily Sonia Side J, DO   17 g at 08/24/21 0932  ? pravastatin (PRAVACHOL) tablet 80 mg  80 mg Oral Daily Rod Can, MD   80 mg at 08/25/21 0946  ? senna (SENOKOT) tablet 8.6 mg  1 tablet Oral Daily Sonia Side J, DO   8.6 mg at 08/25/21 0981  ? ?Facility-Administered Medications Ordered in Other Encounters  ?Medication Dose Route Frequency Provider Last Rate Last Admin  ? sodium chloride flush (NS) 0.9 % injection 3 mL  3 mL Intravenous Q12H Marrianne Mood D, PA-C      ? ? ? ?Discharge Medications: ?Please see discharge summary for a list of discharge medications. ? ?Relevant Imaging Results: ? ?Relevant Lab Results: ? ? ?Additional Information ?SS# [phone] ? ?Coralee Pesa, LCSWA ? ? ? ? ?

## 2021-08-25 NOTE — Progress Notes (Signed)
PT Cancellation Note ? ?Patient Details ?Name: Meshach Perry ?MRN: [id number] ?DOB: May 27, 1946 ? ? ?Cancelled Treatment:    Reason Eval/Treat Not Completed: Pain limiting ability to participate; pt, wife and son requesting PT treatment hold today due to pain; wife reports, "He screamed in pain when he saw you walk through the door... so then if he's screaming in pain, I'd be very mad at you guys." Will follow-up tomorrow. ? ?Mabeline Caras, PT, DPT ?Acute Rehabilitation Services  ?Pager [phone] ?Office [phone] ? ?Derry Lory ?08/25/2021, 2:34 PM ?

## 2021-08-25 NOTE — TOC Progression Note (Signed)
Transition of Care (TOC) - Progression Note  ? ? ?Patient Details  ?Name: Kerri Asche ?MRN: [id number] ?Date of Birth: 1946/03/30 ? ?Transition of Care (TOC) CM/SW Contact  ?Bartholomew Crews, RN ?Phone Number: [phone] ?08/25/2021, 1:47 PM ? ?Clinical Narrative:    ? ?Spoke with patient, son, and patient's wife along with attendings at bedside to discuss post acute transition. Family would like the patient to be considered for AIR by insurance provider.  ? ?Discussed alternatives including SNF - agreeable to being faxed out with preferences for #1 [facility]; #2 [facility]; #3 [facility]; and #4 [facility] CSW to assist with SNF work up.  ? ?Spoke with Vida Roller RNCM at [facility]. Patient is tentatively scheduled for visit 09/01/21. If patient transitions home, [facility] will visit within 3 days of discharge.  ? ?[facility] following for [facility] needs.  ? ?Expected Discharge Plan: [place] (declined SNF placement) ?Barriers to Discharge: Continued Medical Work up ? ?Expected Discharge Plan and Services ?Expected Discharge Plan: [place] (declined SNF placement) ?  ?Discharge Planning Services: CM Consult ?  ?  ?                ?  ?  ?  ?  ?  ?HH Arranged: PT, OT, Social Work, Nurse's Aide ?[place] Agency: [place] ?Date HH Agency Contacted: 08/21/21 ?Time [place]: [phone] ?Representative spoke with at [place]: Tommi Rumps ? ? ?Social Determinants of Health (SDOH) Interventions ?  ? ?Readmission Risk Interventions ?No flowsheet data found. ? ?

## 2021-08-25 NOTE — Progress Notes (Signed)
Called CIR for clarification and potential reevaluation for this patient, but they stated given his health team advantage insurance they will not approve hospitalization for his hip fracture.  They state that he would need to have a diagnosis that would require a provider in the hospital to manage, and his condition would not qualify, nor would treatment of his pain.  Asked about his ongoing weakness due to his supranuclear palsy, and since that has not what he is hospitalized for, and is an ongoing chronic issue, would not qualify.Overall it seems that SNF would be best for this patient if family amenable. Will have family meeting at noon today.  ?

## 2021-08-25 NOTE — Plan of Care (Signed)
  Problem: Nutrition: Goal: Adequate nutrition will be maintained Outcome: Progressing   Problem: Elimination: Goal: Will not experience complications related to bowel motility Outcome: Progressing   Problem: Safety: Goal: Ability to remain free from injury will improve Outcome: Progressing   Problem: Skin Integrity: Goal: Risk for impaired skin integrity will decrease Outcome: Progressing   

## 2021-08-26 ENCOUNTER — Inpatient Hospital Stay (HOSPITAL_COMMUNITY): Payer: PPO

## 2021-08-26 LAB — CBC
HCT: 28.7 % — ABNORMAL LOW (ref 39.0–52.0)
Hemoglobin: 9.8 g/dL — ABNORMAL LOW (ref 13.0–17.0)
MCH: 31.6 pg (ref 26.0–34.0)
MCHC: 34.1 g/dL (ref 30.0–36.0)
MCV: 92.6 fL (ref 80.0–100.0)
Platelets: 291 10*3/uL (ref 150–400)
RBC: 3.1 MIL/uL — ABNORMAL LOW (ref 4.22–5.81)
RDW: 14.3 % (ref 11.5–15.5)
WBC: 9 10*3/uL (ref 4.0–10.5)
nRBC: 0 % (ref 0.0–0.2)

## 2021-08-26 MED ORDER — DOXYCYCLINE HYCLATE 100 MG PO TABS
100.0000 mg | ORAL_TABLET | Freq: Two times a day (BID) | ORAL | Status: DC
  Administered 2021-08-26 – 2021-08-28 (×5): 100 mg via ORAL
  Filled 2021-08-26 (×5): qty 1

## 2021-08-26 MED ORDER — POTASSIUM CHLORIDE 20 MEQ PO PACK
40.0000 meq | PACK | Freq: Once | ORAL | Status: AC
  Administered 2021-08-26: 40 meq via ORAL
  Filled 2021-08-26: qty 2

## 2021-08-26 MED ORDER — ACETAMINOPHEN 500 MG PO TABS
1000.0000 mg | ORAL_TABLET | Freq: Four times a day (QID) | ORAL | Status: DC
  Administered 2021-08-26 – 2021-08-28 (×7): 1000 mg via ORAL
  Filled 2021-08-26 (×8): qty 2

## 2021-08-26 NOTE — Progress Notes (Signed)
Occupational Therapy Treatment ?Patient Details ?Name: [name] ?MRN: [id number] ?DOB: 12-12-1945 ?Today's Date: 08/26/2021 ? ? ?History of present illness Pt is a 76 yr old had a witness fall on 3/10  a comminuted displaced vertical basicervical left femoral neck fracture and s/p L THA with an anterior approach. PMH: efib, COPD, GERD, HLD, CAD s/p stent CVA x2  with L side weakness, progressive supranuclear palsy, IBS, MI, PNA ?  ?OT comments ? Pt completed supine to sit with total +2 (pt 20%).  He needed overall mod assist level for static sitting balance for up to approximately 5-6 mins.  [name] was utilized for standing balance and transfer to the wheelchair, again with total +2 (pt 30%).  He was able to maintain standing in the [facility] for approximately 2 mins before needing to rest.  Still with slower processing and execution of tasks overall secondary to progressive neurological disease and left hip pain/weakness.  Will benefit from continued acute care OT to help increase strength and ADL performance, however feel pt will need follow-up SNF for continued rehab prior to home.    ? ?Recommendations for follow up therapy are one component of a multi-disciplinary discharge planning process, led by the attending physician.  Recommendations may be updated based on patient status, additional functional criteria and insurance authorization. ?   ?Follow Up Recommendations ? Skilled nursing-short term rehab (<3 hours/day)  ?  ?Assistance Recommended at Discharge Frequent or constant Supervision/Assistance  ?Patient can return home with the following ? Two people to help with bathing/dressing/bathroom;Two people to help with walking and/or transfers;Assistance with feeding;Direct supervision/assist for medications management;Direct supervision/assist for financial management;Assist for transportation;Help with stairs or ramp for entrance;Assistance with cooking/housework ?  ?Equipment Recommendations ? None  recommended by OT  ?  ?Recommendations for Other Services   ? ?  ?Precautions / Restrictions Precautions ?Precautions: Fall ?Precaution Comments: Direct anterior no hip precuations ?Restrictions ?Weight Bearing Restrictions: No ?LLE Weight Bearing: Weight bearing as tolerated  ? ? ?  ? ?Mobility Bed Mobility ?Overal bed mobility: Needs Assistance ?Bed Mobility: Supine to Sit ?  ?  ?Supine to sit: +2 for physical assistance, Total assist ?  ?  ?General bed mobility comments: Pt needed assist with all aspects of supine to sit including advancing the RLE off of the edge of the bed.  Once sitting, he demonstrates a posterior lean requiring constant mod assist to maintain upright sitting balance.  Therapists had to assist with flexing his knees as well as positioning theLLE up on the platform of the [place].  In the [place] he would exhibit forward lean requiring total assist to maintain sitting balance while completing grooming task of oral hygiene with the RUE. ?  ? ?Transfers ?Overall transfer level: Needs assistance ?Equipment used:  [name]) ?Transfers: Sit to/from Stand, Bed to chair/wheelchair/BSC ?Sit to Stand: Total assist, +2 physical assistance ?  ?  ?  ?  ?  ?  ?Transfer via Lift Equipment: Stedy ?  ?Balance Overall balance assessment: Needs assistance ?Sitting-balance support: Feet supported, Bilateral upper extremity supported ?Sitting balance-Leahy Scale: Poor ?  ?  ?Standing balance support: Bilateral upper extremity supported ?Standing balance-Leahy Scale: Zero ?Standing balance comment: Total assist was needed for maintaining sitting balance in the [place]. ?  ?  ?  ?  ?  ?  ?  ?  ?  ?  ?  ?   ? ?ADL either performed or assessed with clinical judgement  ? ?ADL Overall ADL's : Needs assistance/impaired ?  ?  ?  Grooming: Oral care;Sitting ?Grooming Details (indicate cue type and reason): max assist for sitting balance in the [place] with mod assist for initiation and completion of oral hygiene ?  ?  ?  ?  ?  ?  ?   ?  ?Toilet Transfer: +2 for physical assistance;Total assistance ?Toilet Transfer Details (indicate cue type and reason): simulated with use of the Stedy ?  ?  ?  ?  ?Functional mobility during ADLs: +2 for safety/equipment;+2 for physical assistance (with use of the Stedy for sit to stand and transfer to the recliner) ?  ?  ? ? ?   ?   ?   ? ?Cognition Arousal/Alertness: Awake/alert ?Behavior During Therapy: Flat affect ?Overall Cognitive Status: History of cognitive impairments - at baseline ?Area of Impairment: Attention, Following commands, Awareness, Problem solving ?  ?  ?  ?  ?  ?  ?  ?  ?  ?Current Attention Level: Sustained ?  ?Following Commands: Follows one step commands inconsistently ?  ?Awareness: Emergent ?Problem Solving: Slow processing, Difficulty sequencing, Requires verbal cues, Requires tactile cues ?General Comments: Pt needing increased time and mod demonstrational cueing for initiation of bed mobility, sustained sitting balance, and sit to stand.  increased time with mod demonstrational cueing also needed for initiation and completion of oral hygiene sitting in the [place]. ?  ?  ?   ?   ?   ?   ? ? ?Pertinent Vitals/ Pain       Pain Assessment ?Pain Assessment: Faces ?Faces Pain Scale: Hurts little more ?Pain Location: left hip ?Pain Descriptors / Indicators: Discomfort ?Pain Intervention(s): Limited activity within patient's tolerance, Monitored during session, Repositioned ? ?   ?   ? ?Frequency ? Min 2X/week  ? ? ? ? ?  ?Progress Toward Goals ? ?OT Goals(current goals can now be found in the care plan section) ? Progress towards OT goals: Progressing toward goals ? ?Acute Rehab OT Goals ?Patient Stated Goal: Pt agreed to OOB and for completion or grooming task. ?Potential to Achieve Goals: Fair  ?Plan Discharge plan remains appropriate;Discharge plan needs to be updated   ? ?Co-evaluation ? ? ? PT/OT/SLP Co-Evaluation/Treatment: Yes ?Reason for Co-Treatment: Complexity of the patient's  impairments (multi-system involvement);For patient/therapist safety;To address functional/ADL transfers ?  ?OT goals addressed during session: ADL's and self-care ?  ? ?  ?AM-PAC OT "6 Clicks" Daily Activity     ?Outcome Measure ? ? Help from another person eating meals?: A Little ?Help from another person taking care of personal grooming?: A Lot ?Help from another person toileting, which includes using toliet, bedpan, or urinal?: Total ?Help from another person bathing (including washing, rinsing, drying)?: Total ?Help from another person to put on and taking off regular upper body clothing?: Total ?Help from another person to put on and taking off regular lower body clothing?: Total ?6 Click Score: 9 ? ?  ?End of Session Equipment Utilized During Treatment: Gait belt;Other (comment) [name]) ? ?OT Visit Diagnosis: Unsteadiness on feet (R26.81);Other abnormalities of gait and mobility (R26.89);Muscle weakness (generalized) (M62.81);History of falling (Z91.81);Pain ?Pain - Right/Left: Left ?Pain - part of body: Hip ?  ?   ?Patient Left with call bell/phone within reach;with family/visitor present;in chair ?  ?Nurse Communication Mobility status;Need for lift equipment ?  ? ?   ? ?Time: 1791-5056 ?OT Time Calculation (min): 34 min ? ?Charges: OT General Charges ?$OT Visit: 1 Visit ?OT Treatments ?$Self Care/Home Management : 8-22 mins ? ?Alice Burnside OTR/L ?08/26/2021,  4:04 PM ?

## 2021-08-26 NOTE — Progress Notes (Signed)
Since surgery, the patient has had significant pain.  He is sensitive to narcotic medications, and his pain has been difficult to control without oversedation.  He is having pain that is limiting his ability to participate with physical therapy.  I spoke with the patient's wife, [name].  We discussed that his femoral bone stock was extremely compromised due to the fracture pattern.  I had to use a revision femoral component with distal fixation to bypass the affected area.  We discussed that this type of implant causes increased femoral pain until there is bony ingrowth at around roughly 6 weeks.  Nonetheless, he is having more pain than I would expect in this situation.  I recommend repeating his hip x-ray, which I ordered.  I was later informed by his RN that the patient himself refused the x-rays because he did not want to be repositioned in any way.  I recommend attempting the x-rays later tonight/tomorrow. ?

## 2021-08-26 NOTE — Progress Notes (Signed)
X-ray completed this am. I reviewed the imaging, which does not show any complication or change since immediate postop imaging. Continue current care. ?

## 2021-08-26 NOTE — Progress Notes (Signed)
This chaplain responded to Dr. Ardelia Mems consult for spiritual care. The Pt. wife-[name] is at the bedside. The Pt. is able to experience the pain and communicate with the chaplain during the visit. ? ?Through reflective listening and story telling [name] and the Pt. shared their admission progress with the chaplain. [name] celebrated the Pt. son-CL's strengths in  helping the Pt. and [name] navigate the Pt. next steps.  ? ?The chaplain understands the selection of a SNF has been made. [name] remains hopeful the Pt. will be able to return home after SNF.  [name] describes the Pt. as much more "patient" than herself and the reason the couple celebrates many years of marriage together.   ? ?The Pt. accepted the chaplain's invitation for prayer and blessings on their journey. ? ?Chaplain Sallyanne Kuster ?[phone] ?

## 2021-08-26 NOTE — Progress Notes (Signed)
Physical Therapy Treatment ?Patient Details ?Name: [name] ?MRN: [id number] ?DOB: [birth year] ?Today's Date: 08/26/2021 ? ? ?History of Present Illness Pt is a 76 y.o. male admitted 08/15/21 after fall sustaining comminuted displaced vertical basicervical L femoral neck fx. S/p L THA 3/13. Course complicated by suspicion for developing PNA, significant LLE pain. Repeat L hip xray 3/21 shows no acute findings. PMH includes progressive supranuclear palsy, afib, COPD, HLD, CAD, CVAs (residual L-side weakness), IBS. ?  ?PT Comments  ? ? Pt slowly progressing with mobility. Today's session focused on seated balance and standing trial in stedy frame; pt requiring maxA+1-2 for majority of mobility. Pt demonstrates improving LLE pain, but endorses significant fatigue this session. Pt remains limited by generalized weakness, decreased activity tolerance, poor balance strategies/postural reactions and impaired cognition. Continue to recommend SNF-level therapies to maximize functional mobility and decrease caregiver burden. ?   ?Recommendations for follow up therapy are one component of a multi-disciplinary discharge planning process, led by the attending physician.  Recommendations may be updated based on patient status, additional functional criteria and insurance authorization. ? ?Follow Up Recommendations ? Skilled nursing-short term rehab (<3 hours/day) ?  ?  ?Assistance Recommended at Discharge Frequent or constant Supervision/Assistance  ?Patient can return home with the following Two people to help with walking and/or transfers;Two people to help with bathing/dressing/bathroom;Direct supervision/assist for medications management;Help with stairs or ramp for entrance;Assist for transportation;Assistance with cooking/housework;Direct supervision/assist for financial management;Assistance with feeding ?  ?Equipment Recommendations ?  (defer to next venue)  ?  ?Recommendations for Other Services   ? ? ?  ?Precautions /  Restrictions Precautions ?Precautions: Fall ?Precaution Comments: L hip JP drain ?Restrictions ?Weight Bearing Restrictions: Yes ?LLE Weight Bearing: Weight bearing as tolerated  ?  ? ?Mobility ? Bed Mobility ?Overal bed mobility: Needs Assistance ?Bed Mobility: Supine to Sit ?  ?  ?Supine to sit: Max assist, +2 for physical assistance, HOB elevated ?  ?  ?General bed mobility comments: MaxA+2 for BLE management, trunk elevation and scooting hips to EOB; pt minimally assisting with RLE movement and RUE support on bed rail, no initiation of LLE movement noted ?  ? ?Transfers ?Overall transfer level: Needs assistance ?Equipment used: Ambulation equipment used (stedy) ?Transfers: Sit to/from Stand, Bed to chair/wheelchair/BSC ?Sit to Stand: Max assist, +2 physical assistance, From elevated surface ?  ?  ?  ?  ?  ?General transfer comment: pt requiring maxA for flexing bilateral knees sitting EOB in preparation to stand; maxA+2 for trunk elevation from elevated bed into stedy frame; additional 1x stand from stedy frame with maxA+2; maxA+2 for eccentric control into low recliner height ?Transfer via Lift Equipment: Stedy ? ?Ambulation/Gait ?  ?  ?  ?  ?  ?  ?  ?  ? ? ?Stairs ?  ?  ?  ?  ?  ? ? ?Wheelchair Mobility ?  ? ?Modified Rankin (Stroke Patients Only) ?  ? ? ?  ?Balance Overall balance assessment: Needs assistance ?Sitting-balance support: Feet supported, Bilateral upper extremity supported ?Sitting balance-Leahy Scale: Poor ?Sitting balance - Comments: requiring min-maxA for sitting balance with UE support; brushed teeth sitting in stedy frame requiring consistent mod-maxA to maintain upright balance for this task ?  ?Standing balance support: Bilateral upper extremity supported, Reliant on assistive device for balance ?Standing balance-Leahy Scale: Zero ?Standing balance comment: requiring external assist to maintain static standing in stedy frame with bilateral knees blocked and BUE support; external assist for  facilitating midline posture and weight  shifts ?  ?  ?  ?  ?  ?  ?  ?  ?  ?  ?  ?  ? ?  ?Cognition Arousal/Alertness: Awake/alert ?Behavior During Therapy: Flat affect ?Overall Cognitive Status: History of cognitive impairments - at baseline ?Area of Impairment: Attention, Following commands, Awareness, Problem solving ?  ?  ?  ?  ?  ?  ?  ?  ?  ?Current Attention Level: Sustained ?  ?Following Commands: Follows one step commands inconsistently, Follows one step commands with increased time ?  ?Awareness: Emergent ?Problem Solving: Slow processing, Difficulty sequencing, Requires verbal cues, Requires tactile cues ?General Comments: Pt needing increased time and mod demonstrational cueing for initiation of bed mobility, sustained sitting balance, and sit to stand.  increased time with mod demonstrational cueing also needed for initiation and completion of oral hygiene sitting in the [place]. ?  ?  ? ?  ?Exercises Other Exercises ?Other Exercises: R ankle pumps, L ankle DF/PF PROM; bilat knee flex/ext AAROM ? ?  ?General Comments General comments (skin integrity, edema, etc.): pt's wife in room, very engaged in session; pt requires frequent engagement in conversation and session. SpO2 96% on RA, HR 68 ?  ?  ? ?Pertinent Vitals/Pain Pain Assessment ?Pain Assessment: Faces ?Faces Pain Scale: Hurts little more ?Pain Location: L hip ?Pain Descriptors / Indicators: Discomfort, Grimacing, Guarding ?Pain Intervention(s): Monitored during session, Limited activity within patient's tolerance, Premedicated before session, Repositioned  ? ? ?Home Living   ?  ?  ?  ?  ?  ?  ?  ?  ?  ?   ?  ?Prior Function    ?  ?  ?   ? ?PT Goals (current goals can now be found in the care plan section) Progress towards PT goals: Progressing toward goals ? ?  ?Frequency ? ? ? Min 3X/week ? ? ? ?  ?PT Plan Current plan remains appropriate  ? ? ?Co-evaluation PT/OT/SLP Co-Evaluation/Treatment: Yes ?Reason for Co-Treatment: Complexity of the  patient's impairments (multi-system involvement);For patient/therapist safety;To address functional/ADL transfers ?PT goals addressed during session: Mobility/safety with mobility;Balance ?OT goals addressed during session: ADL's and self-care ?  ? ?  ?AM-PAC PT "6 Clicks" Mobility   ?Outcome Measure ? Help needed turning from your back to your side while in a flat bed without using bedrails?: Total ?Help needed moving from lying on your back to sitting on the side of a flat bed without using bedrails?: Total ?Help needed moving to and from a bed to a chair (including a wheelchair)?: Total ?Help needed standing up from a chair using your arms (e.g., wheelchair or bedside chair)?: Total ?Help needed to walk in hospital room?: Total ?Help needed climbing 3-5 steps with a railing? : Total ?6 Click Score: 6 ? ?  ?End of Session Equipment Utilized During Treatment: Gait belt ?Activity Tolerance: Patient tolerated treatment well;Patient limited by fatigue ?Patient left: in chair;with call bell/phone within reach;with family/visitor present ?Nurse Communication: Mobility status;Need for lift equipment ?PT Visit Diagnosis: Other symptoms and signs involving the nervous system (R29.898);Pain;History of falling (Z91.81);Muscle weakness (generalized) (M62.81) ?Pain - Right/Left: Left ?Pain - part of body: Hip ?  ? ? ?Time: 4270-6237 ?PT Time Calculation (min) (ACUTE ONLY): 31 min ? ?Charges:  $Therapeutic Activity: 8-22 mins          ?          ? ?Mabeline Caras, PT, DPT ?Acute Rehabilitation Services  ?Pager [phone] ?Office [phone] ? ?Ellison Hughs  Merrilee Seashore ?08/26/2021, 5:20 PM ? ?

## 2021-08-26 NOTE — TOC Progression Note (Addendum)
Transition of Care (TOC) - Progression Note  ? ? ?Patient Details  ?Name: [name] ?MRN: [id number] ?Date of Birth: 02-26-1946 ? ?Transition of Care (TOC) CM/SW Contact  ?Joanne Chars, LCSW ?Phone Number: ?08/26/2021, 12:49 PM ? ?Clinical Narrative:   CSW reached out to [place], [place], [place] resources to ask that they review referral.  ?  ?1230: ?[facility] does not offer bed. ?[facility] cannot offer due to expensive home medication. ?No response from [facility], CSW sent follow up message. ? ?CSW spoke with pt son, who is driving back to [place].  First choice is [facility].  Pt wife cannot drive far, not sure if she will [facility]. CSW spoke with [facility] and she will inquire if family can provide the home medication and if that would allow them to make bed offer.  ? ?CSW LM with [employer] to initiate insurance auth.  ? ?1320: [facility] offers bed, son accepts, Tammy at [facility] updated with facility choice. ? ?1525: passr approved: [id number] E ? ?Expected Discharge Plan: [place] (declined SNF placement) ?Barriers to Discharge: Continued Medical Work up ? ?Expected Discharge Plan and Services ?Expected Discharge Plan: [place] (declined SNF placement) ?  ?Discharge Planning Services: CM Consult ?  ?  ?                ?  ?  ?  ?  ?  ?HH Arranged: PT, OT, Social Work, Nurse's Aide ?[place] Agency: [place] ?Date HH Agency Contacted: 08/21/21 ?Time [place]: 5093 ?Representative spoke with at [place]: Tommi Rumps ? ? ?Social Determinants of Health (SDOH) Interventions ?  ? ?Readmission Risk Interventions ?No flowsheet data found. ? ?

## 2021-08-26 NOTE — Progress Notes (Signed)
Family Medicine Teaching Service ?Daily Progress Note ?Intern Pager: [phone] ? ?Patient name: [name] Medical record number: [id number] ?Date of birth: [birth year] Age: 76 y.o. Gender: male ? ?Primary Care Provider: [name], MD ?Consultants: Orthopedic surgery ?Code Status: DNR ? ?Pt Overview and Major Events to Date:  ?3/11: admitted for left femoral fracture ?3/13: Left total hip arthroplasty ? ?Assessment and Plan: ?[name] is a 76 year old male presenting with recurrent falls and consequent left femoral fracture now s/p left total hip arthroplasty. Past medical history significant for progressive supranuclear palsy, paroxysmal A-fib on Coumadin, CAD s/p stent (2017), MI (2007), CVA x2, COPD, GERD, hypertension, hyperlipidemia, BPH. ? ?Femoral neck fracture status post left total hip arthroplasty: Stable ?Orthopedics consulted yesterday for reassessment and advised hip x-ray however patient declined due to positioning.  Patient further has been unable to work with PT/OT given intolerance to pain.  Left hip x-ray shows that bipolar left hip prosthesis in expected position and no acute findings.  Appreciate Ortho's assistance. ?-Continue Oxy IR 5 mg every 6 hours, Oxy IR 2.5 mg every 6 hours as needed ?-Change Tylenol to '1000mg'$  q6h ?-PT/OT eval and treat ? ?Challenging disposition ?Family wants to continue pursuing CIR however are increasingly amenable to SNF.  ?-Chaplain consulted, appreciate assistance ?-Awaiting SNF placement ? ?Hospital-acquired pneumonia ?CXR showed new area of subtle opacity and or increasing area of subtle opacity in the LEFT mid chest. This is suspicious for developing pneumonia. Very subtle linear opacity in this area on prior imaging.  Patient was started on doxycycline yesterday.  CBC still does not show elevated white count. ?-Continue doxycycline 100 mg every 12 hours (3/20-) ?-Incentive spirometry every 2 hours while awake ?-Albuterol every 4 hours as  needed ? ?Acute blood loss anemia: Stable ?Hemoglobin improved to 9.8.  Patient was restarted on Lovenox for DVT prophylaxis and family has agreed to hold anticoagulation for A-fib dosing at this time. ?-Discontinue INR checks ?-A.m. CBC ? ?FEN/GI: Regular ?PPx: Lovenox ?Dispo:SNF  pending approval .  ? ?Subjective:  ?Patient states his pain is under control at this time however getting the hip x-ray was nonfunded he appreciates that the pain medications are on board.  He will do his best to work with PT/OT today.  No changes in shortness of breath. ? ?Objective: ?Temp:  [98.5 ?F (36.9 ?C)-98.6 ?F (37 ?C)] 98.5 ?F (36.9 ?C) (03/21 7782) ?Pulse Rate:  [67-69] 67 (03/21 0438) ?Resp:  [17-20] 20 (03/21 0438) ?BP: (129-141)/(51-77) 141/51 (03/21 0438) ?SpO2:  [95 %-97 %] 95 % (03/21 0438) ?Physical Exam: ?General: Awake, alert to person and time not place, NAD ?Cardiovascular: RRR, no murmurs auscultated ?Respiratory: CTA B, normal work of breathing ?Abdomen: Soft, minimally distended, normoactive bowel sounds ?Extremities: Surgical site with clean bandage without surrounding erythema, serosanguineous blood in the drainage bulb, SCDs in place ? ?Laboratory: ?Recent Labs  ?Lab 08/24/21 ?0215 08/25/21 ?0252 08/26/21 ?4235  ?WBC 8.6 8.0 9.0  ?HGB 9.2* 9.2* 9.8*  ?HCT 27.9* 27.2* 28.7*  ?PLT 242 258 291  ? ?Recent Labs  ?Lab 08/22/21 ?0243 08/23/21 ?0301 08/24/21 ?0215  ?NA 138 141 137  ?K 3.8 3.7 3.7  ?CL 109 110 106  ?CO2 21* 21* 23  ?BUN 30* 20 15  ?CREATININE 1.26* 1.08 0.96  ?CALCIUM 8.2* 8.3* 8.2*  ?GLUCOSE 110* 124* 144*  ? ?Imaging/Diagnostic Tests: ?DG CHEST PORT 1 VIEW ? ?Result Date: 08/25/2021 ?CLINICAL DATA:  Cough in a 76 year old male. EXAM: PORTABLE CHEST 1 VIEW COMPARISON:  Comparison made with  August 20, 2021. FINDINGS: Image rotated slightly to the RIGHT. Accounting for this cardiomediastinal contours and hilar structures are stable. New area of subtle opacity and or increasing area of subtle opacity in the  LEFT mid chest. No signs of lobar consolidative process. No visible effusion, pneumothorax or pulmonary edema. On limited assessment there is no acute skeletal finding. IMPRESSION: New area of subtle opacity and or increasing area of subtle opacity in the LEFT mid chest. This is suspicious for developing pneumonia. Very subtle linear opacity in this area on prior imaging. Electronically Signed   By: Zetta Bills M.D.   On: 08/25/2021 14:59   ? ?Wells Guiles, DO ?08/26/2021, 8:11 AM ?PGY-1, [employer] [facility] ?[place] Intern pager: [phone], text pages welcome ? ?

## 2021-08-26 NOTE — Progress Notes (Signed)
FPTS Brief Note ?Reviewed patient's vitals, recent notes.  ?Vitals:  ? 08/26/21 0925 08/26/21 1959  ?BP: 138/78 136/81  ?Pulse: 68 66  ?Resp: 20 20  ?Temp: 98.6 ?F (37 ?C) 98.2 ?F (36.8 ?C)  ?SpO2: 96% 97%  ? ?At this time, no change in plan from day progress note.  ?France Ravens, MD ?Page [phone] with questions about this patient.  ?  ?

## 2021-08-27 ENCOUNTER — Ambulatory Visit: Payer: PPO | Admitting: Physical Therapy

## 2021-08-27 LAB — CBC
HCT: 28.7 % — ABNORMAL LOW (ref 39.0–52.0)
Hemoglobin: 9.3 g/dL — ABNORMAL LOW (ref 13.0–17.0)
MCH: 30.5 pg (ref 26.0–34.0)
MCHC: 32.4 g/dL (ref 30.0–36.0)
MCV: 94.1 fL (ref 80.0–100.0)
Platelets: 286 10*3/uL (ref 150–400)
RBC: 3.05 MIL/uL — ABNORMAL LOW (ref 4.22–5.81)
RDW: 14.5 % (ref 11.5–15.5)
WBC: 8.3 10*3/uL (ref 4.0–10.5)
nRBC: 0 % (ref 0.0–0.2)

## 2021-08-27 LAB — SARS CORONAVIRUS 2 (TAT 6-24 HRS): SARS Coronavirus 2: NEGATIVE

## 2021-08-27 LAB — BASIC METABOLIC PANEL
Anion gap: 7 (ref 5–15)
BUN: 14 mg/dL (ref 8–23)
CO2: 26 mmol/L (ref 22–32)
Calcium: 8.6 mg/dL — ABNORMAL LOW (ref 8.9–10.3)
Chloride: 105 mmol/L (ref 98–111)
Creatinine, Ser: 0.98 mg/dL (ref 0.61–1.24)
GFR, Estimated: >60 mL/min (ref >60)
Glucose, Bld: 113 mg/dL — ABNORMAL HIGH (ref 70–99)
Potassium: 3.7 mmol/L (ref 3.5–5.1)
Sodium: 138 mmol/L (ref 135–145)

## 2021-08-27 LAB — VITAMIN D 25 HYDROXY (VIT D DEFICIENCY, FRACTURES): Vit D, 25-Hydroxy: 57.56 ng/mL (ref 30–100)

## 2021-08-27 MED ORDER — CHOLECALCIFEROL 10 MCG (400 UNIT) PO TABS: 800.0000 [IU] | ORAL_TABLET | Freq: Every day | ORAL | Status: DC

## 2021-08-27 MED ORDER — POTASSIUM CHLORIDE 20 MEQ PO PACK
40.0000 meq | PACK | Freq: Two times a day (BID) | ORAL | Status: DC
  Administered 2021-08-27: 40 meq via ORAL
  Filled 2021-08-27: qty 2

## 2021-08-27 MED ORDER — CALCIUM CARBONATE 1250 (500 CA) MG PO TABS
1000.0000 mg | ORAL_TABLET | Freq: Every day | ORAL | Status: DC
  Administered 2021-08-27 – 2021-08-28 (×2): 1000 mg via ORAL
  Filled 2021-08-27 (×2): qty 1

## 2021-08-27 MED ORDER — CHOLECALCIFEROL 10 MCG (400 UNIT) PO TABS
800.0000 [IU] | ORAL_TABLET | Freq: Every day | ORAL | Status: DC
  Administered 2021-08-28: 800 [IU] via ORAL
  Filled 2021-08-27 (×2): qty 2

## 2021-08-27 NOTE — Progress Notes (Signed)
This chaplain is present for F/U spiritual care. The Pt. wife-[name] and [name]'s friend-[name] are at the bedside.  The Pt. is awake and alert, following the conversation in the room. [name] energetically shares the Pt. ate cream of wheat and french toast for breakfast.  ? ?The chaplain understands settling into a restful night was challenging, but the Pt. welcomed a better night's sleep from the previous night. The Pt. described his pain at an "8-8.5". [name] added the Pt. morning care routine may have contributed to the delay in receiving the Pt. medication. The Pt. and [name] are celebrating being able to talk about the pain and hopeful the Pt. will have a season of strength building care. The chaplain accepted [name]'s request for prayer. ? ?Chaplain Sallyanne Kuster ?[phone] ?

## 2021-08-27 NOTE — Progress Notes (Signed)
Family Medicine Teaching Service ?Daily Progress Note ?Intern Pager: [phone] ? ?Patient name: [name] Medical record number: [id number] ?Date of birth: [birth year] Age: 76 y.o. Gender: male ? ?Primary Care Provider: [name], MD ?Consultants: Orthopedic surgery ?Code Status: DNR ? ?Pt Overview and Major Events to Date:  ?3/11: admitted for left femoral fracture ?3/13: Left total hip arthroplasty ? ?Assessment and Plan: ?[name] is a 76 year old male presenting with recurrent falls and consequent left femoral fracture now s/p left total hip arthroplasty. Past medical history significant for progressive supranuclear palsy, paroxysmal A-fib on Coumadin, CAD s/p stent (2017), MI (2007), CVA x2, COPD, GERD, hypertension, hyperlipidemia, BPH.  Medically stable for discharge to SNF. ? ?Hospital-acquired pneumonia: stable ?WBC stable.  Continue with doxycycline course of 5 days.  ?-Continue doxycycline 100 mg every 12 hours (3/20-) ?-Incentive spirometry every 2 hours while awake ?-Albuterol every 4 hours as needed ? ?Femoral neck fracture status post left total hip arthroplasty: Stable ?Pain stable and continuing with therapy stable for discharge to SNF. Given fracture risk, may benefit from calcium+vitamin D for secondary prevention. ?-Continue Oxy IR 5 mg every 6 hours, Tylenol 1000 mg every 6 hours, Oxy IR 2.5 mg every 6 hours as needed ?-PT/OT eval and treat ?-follow up vitamin D level ?-Start calcium 1g and Vitamin D 800 IU daily ? ?Acute blood loss anemia: Stable ?Hemoglobin stable at 9.8. ?-Lab holiday ? ?FEN/GI: Regular ?PPx: Lovenox ?Dispo:SNF today. Barriers include none, medically stable for SNF.  ? ?Subjective:  ?Patient states his pain is under control and he feels that he is currently ahead of the pain is appreciative of the dosage regimen currently.  Endorses cough with the sputum that he is unable to cough up. ? ?Objective: ?Temp:  [97.4 ?F (36.3 ?C)-98.6 ?F (37 ?C)] 97.4 ?F (36.3 ?C)  (03/22 0429) ?Pulse Rate:  [64-68] 64 (03/22 0429) ?Resp:  [18-20] 18 (03/22 0429) ?BP: (136-143)/(78-81) 143/79 (03/22 0429) ?SpO2:  [96 %-99 %] 99 % (03/22 0429) ?Weight:  [86.3 kg-88 kg] 86.3 kg (03/22 0500) ?Physical Exam: ?General: Awake, alert to person and time not place, NAD, actively being washed after bowel movement ?Cardiovascular: RRR, no murmurs auscultated ?Respiratory: CTA B, normal work of breathing, no cough appreciated ?Extremities: Serosanguineous blood in the drainage bulb ? ?Laboratory: ?Recent Labs  ?Lab 08/24/21 ?0215 08/25/21 ?0252 08/26/21 ?4888  ?WBC 8.6 8.0 9.0  ?HGB 9.2* 9.2* 9.8*  ?HCT 27.9* 27.2* 28.7*  ?PLT 242 258 291  ? ?Recent Labs  ?Lab 08/22/21 ?0243 08/23/21 ?0301 08/24/21 ?0215  ?NA 138 141 137  ?K 3.8 3.7 3.7  ?CL 109 110 106  ?CO2 21* 21* 23  ?BUN 30* 20 15  ?CREATININE 1.26* 1.08 0.96  ?CALCIUM 8.2* 8.3* 8.2*  ?GLUCOSE 110* 124* 144*  ? ?Imaging/Diagnostic Tests: ?DG HIP UNILAT WITH PELVIS 2-3 VIEWS LEFT ? ?Result Date: 08/26/2021 ?CLINICAL DATA:  Left hip fracture. Postop from left hip replacement. EXAM: DG HIP (WITH OR WITHOUT PELVIS) 2-3V LEFT COMPARISON:  08/18/2021 FINDINGS: Bipolar left hip prosthesis again seen with cerclage wire in the intertrochanteric region. No evidence of fracture or dislocation. Overlying skin staples are noted. No other osseous abnormality identified. Extensive peripheral vascular calcification noted. IMPRESSION: Bipolar left hip prosthesis in expected position. No acute findings. Electronically Signed   By: [name] M.D.   On: 08/26/2021 09:10   ? ?William Guiles, DO ?08/27/2021, 7:09 AM ?PGY-1, [facility] ?[place] Intern pager: [phone], text pages welcome ? ?

## 2021-08-27 NOTE — TOC Progression Note (Signed)
Transition of Care (TOC) - Progression Note  ? ? ?Patient Details  ?Name: [name] ?MRN: [id number] ?Date of Birth: 05-19-1946 ? ?Transition of Care (TOC) CM/SW Contact  ?Joanne Chars, LCSW ?Phone Number: ?08/27/2021, 1:46 PM ? ?Clinical Narrative:   CSW received call from Jaconita at [facility].  [facility] due by 10 pm on Thursday, 3/23. Call Dr Amalia Hailey [phone].  Ambulance transport was approved: [id number].  MD informed. ? ? ? ?Expected Discharge Plan: [place] (declined SNF placement) ?Barriers to Discharge: Continued Medical Work up ? ?Expected Discharge Plan and Services ?Expected Discharge Plan: [place] (declined SNF placement) ?  ?Discharge Planning Services: CM Consult ?  ?  ?                ?  ?  ?  ?  ?  ?HH Arranged: PT, OT, Social Work, Nurse's Aide ?[place] Agency: [facility] ?Date HH Agency Contacted: 08/21/21 ?Time [place]: 1660 ?Representative spoke with at [place]: Tommi Rumps ? ? ?Social Determinants of Health (SDOH) Interventions ?  ? ?Readmission Risk Interventions ?   ? View : No data to display.  ?  ?  ?  ? ? ?

## 2021-08-27 NOTE — Plan of Care (Signed)
?  Problem: Clinical Measurements: ?Goal: Ability to maintain clinical measurements within normal limits will improve ?Outcome: Progressing ?Goal: Diagnostic test results will improve ?Outcome: Progressing ?  ?Problem: Clinical Measurements: ?Goal: Diagnostic test results will improve ?Outcome: Progressing ?  ?

## 2021-08-27 NOTE — Progress Notes (Signed)
FPTS Brief Note ?Reviewed patient's vitals, recent notes.  ?Vitals:  ? 08/27/21 1500 08/27/21 1932  ?BP: 105/84 117/65  ?Pulse: 60 62  ?Resp: 18 16  ?Temp: 98.3 ?F (36.8 ?C) 98.6 ?F (37 ?C)  ?SpO2: 96% 97%  ? ?At this time, no change in plan from day progress note.  ?Gifford Shave, MD ?Page [phone] with questions about this patient.  ?  ?

## 2021-08-28 ENCOUNTER — Other Ambulatory Visit: Payer: Self-pay | Admitting: Family Medicine

## 2021-08-28 DIAGNOSIS — Z4889 Encounter for other specified surgical aftercare: Secondary | ICD-10-CM | POA: Diagnosis not present

## 2021-08-28 DIAGNOSIS — Z471 Aftercare following joint replacement surgery: Secondary | ICD-10-CM | POA: Diagnosis not present

## 2021-08-28 DIAGNOSIS — J449 Chronic obstructive pulmonary disease, unspecified: Secondary | ICD-10-CM | POA: Diagnosis not present

## 2021-08-28 DIAGNOSIS — M6281 Muscle weakness (generalized): Secondary | ICD-10-CM | POA: Diagnosis not present

## 2021-08-28 DIAGNOSIS — F32A Depression, unspecified: Secondary | ICD-10-CM | POA: Diagnosis not present

## 2021-08-28 DIAGNOSIS — L89620 Pressure ulcer of left heel, unstageable: Secondary | ICD-10-CM | POA: Diagnosis not present

## 2021-08-28 DIAGNOSIS — R2681 Unsteadiness on feet: Secondary | ICD-10-CM | POA: Diagnosis not present

## 2021-08-28 DIAGNOSIS — I4891 Unspecified atrial fibrillation: Secondary | ICD-10-CM | POA: Diagnosis not present

## 2021-08-28 DIAGNOSIS — Z79899 Other long term (current) drug therapy: Secondary | ICD-10-CM | POA: Diagnosis not present

## 2021-08-28 DIAGNOSIS — N400 Enlarged prostate without lower urinary tract symptoms: Secondary | ICD-10-CM | POA: Diagnosis not present

## 2021-08-28 DIAGNOSIS — J189 Pneumonia, unspecified organism: Secondary | ICD-10-CM | POA: Diagnosis not present

## 2021-08-28 DIAGNOSIS — R404 Transient alteration of awareness: Secondary | ICD-10-CM | POA: Diagnosis not present

## 2021-08-28 DIAGNOSIS — E785 Hyperlipidemia, unspecified: Secondary | ICD-10-CM | POA: Diagnosis not present

## 2021-08-28 DIAGNOSIS — I679 Cerebrovascular disease, unspecified: Secondary | ICD-10-CM | POA: Diagnosis not present

## 2021-08-28 DIAGNOSIS — G459 Transient cerebral ischemic attack, unspecified: Secondary | ICD-10-CM | POA: Diagnosis not present

## 2021-08-28 DIAGNOSIS — Z743 Need for continuous supervision: Secondary | ICD-10-CM | POA: Diagnosis not present

## 2021-08-28 DIAGNOSIS — R278 Other lack of coordination: Secondary | ICD-10-CM | POA: Diagnosis not present

## 2021-08-28 DIAGNOSIS — I10 Essential (primary) hypertension: Secondary | ICD-10-CM | POA: Diagnosis not present

## 2021-08-28 DIAGNOSIS — M96842 Postprocedural seroma of a musculoskeletal structure following a musculoskeletal system procedure: Secondary | ICD-10-CM | POA: Diagnosis not present

## 2021-08-28 DIAGNOSIS — Y831 Surgical operation with implant of artificial internal device as the cause of abnormal reaction of the patient, or of later complication, without mention of misadventure at the time of the procedure: Secondary | ICD-10-CM | POA: Diagnosis not present

## 2021-08-28 DIAGNOSIS — Z8673 Personal history of transient ischemic attack (TIA), and cerebral infarction without residual deficits: Secondary | ICD-10-CM | POA: Diagnosis not present

## 2021-08-28 DIAGNOSIS — L89623 Pressure ulcer of left heel, stage 3: Secondary | ICD-10-CM | POA: Diagnosis not present

## 2021-08-28 DIAGNOSIS — I700 Atherosclerosis of aorta: Secondary | ICD-10-CM | POA: Diagnosis not present

## 2021-08-28 DIAGNOSIS — E559 Vitamin D deficiency, unspecified: Secondary | ICD-10-CM | POA: Diagnosis not present

## 2021-08-28 DIAGNOSIS — I110 Hypertensive heart disease with heart failure: Secondary | ICD-10-CM | POA: Diagnosis not present

## 2021-08-28 DIAGNOSIS — Z4801 Encounter for change or removal of surgical wound dressing: Secondary | ICD-10-CM | POA: Diagnosis not present

## 2021-08-28 DIAGNOSIS — Z7401 Bed confinement status: Secondary | ICD-10-CM | POA: Diagnosis not present

## 2021-08-28 DIAGNOSIS — E539 Vitamin B deficiency, unspecified: Secondary | ICD-10-CM | POA: Diagnosis not present

## 2021-08-28 DIAGNOSIS — W19XXXD Unspecified fall, subsequent encounter: Secondary | ICD-10-CM | POA: Diagnosis not present

## 2021-08-28 DIAGNOSIS — L89622 Pressure ulcer of left heel, stage 2: Secondary | ICD-10-CM | POA: Diagnosis not present

## 2021-08-28 DIAGNOSIS — R21 Rash and other nonspecific skin eruption: Secondary | ICD-10-CM | POA: Diagnosis not present

## 2021-08-28 DIAGNOSIS — S7292XA Unspecified fracture of left femur, initial encounter for closed fracture: Secondary | ICD-10-CM | POA: Diagnosis not present

## 2021-08-28 DIAGNOSIS — Z4732 Aftercare following explantation of hip joint prosthesis: Secondary | ICD-10-CM | POA: Diagnosis not present

## 2021-08-28 DIAGNOSIS — S7292XD Unspecified fracture of left femur, subsequent encounter for closed fracture with routine healing: Secondary | ICD-10-CM | POA: Diagnosis not present

## 2021-08-28 DIAGNOSIS — N401 Enlarged prostate with lower urinary tract symptoms: Secondary | ICD-10-CM | POA: Diagnosis not present

## 2021-08-28 DIAGNOSIS — Z993 Dependence on wheelchair: Secondary | ICD-10-CM | POA: Diagnosis not present

## 2021-08-28 DIAGNOSIS — Z96641 Presence of right artificial hip joint: Secondary | ICD-10-CM | POA: Diagnosis not present

## 2021-08-28 DIAGNOSIS — I959 Hypotension, unspecified: Secondary | ICD-10-CM | POA: Diagnosis not present

## 2021-08-28 DIAGNOSIS — T8189XA Other complications of procedures, not elsewhere classified, initial encounter: Secondary | ICD-10-CM | POA: Diagnosis not present

## 2021-08-28 DIAGNOSIS — I480 Paroxysmal atrial fibrillation: Secondary | ICD-10-CM | POA: Diagnosis not present

## 2021-08-28 DIAGNOSIS — R52 Pain, unspecified: Secondary | ICD-10-CM | POA: Diagnosis not present

## 2021-08-28 DIAGNOSIS — D519 Vitamin B12 deficiency anemia, unspecified: Secondary | ICD-10-CM | POA: Diagnosis not present

## 2021-08-28 DIAGNOSIS — M6259 Muscle wasting and atrophy, not elsewhere classified, multiple sites: Secondary | ICD-10-CM | POA: Diagnosis not present

## 2021-08-28 DIAGNOSIS — K5900 Constipation, unspecified: Secondary | ICD-10-CM | POA: Diagnosis not present

## 2021-08-28 DIAGNOSIS — Z96642 Presence of left artificial hip joint: Secondary | ICD-10-CM | POA: Diagnosis not present

## 2021-08-28 DIAGNOSIS — K589 Irritable bowel syndrome without diarrhea: Secondary | ICD-10-CM | POA: Diagnosis not present

## 2021-08-28 DIAGNOSIS — K219 Gastro-esophageal reflux disease without esophagitis: Secondary | ICD-10-CM | POA: Diagnosis not present

## 2021-08-28 DIAGNOSIS — S72002D Fracture of unspecified part of neck of left femur, subsequent encounter for closed fracture with routine healing: Secondary | ICD-10-CM | POA: Diagnosis not present

## 2021-08-28 DIAGNOSIS — G231 Progressive supranuclear ophthalmoplegia [Steele-Richardson-Olszewski]: Secondary | ICD-10-CM | POA: Diagnosis not present

## 2021-08-28 DIAGNOSIS — R1312 Dysphagia, oropharyngeal phase: Secondary | ICD-10-CM | POA: Diagnosis not present

## 2021-08-28 DIAGNOSIS — I2510 Atherosclerotic heart disease of native coronary artery without angina pectoris: Secondary | ICD-10-CM | POA: Diagnosis not present

## 2021-08-28 DIAGNOSIS — R918 Other nonspecific abnormal finding of lung field: Secondary | ICD-10-CM | POA: Diagnosis not present

## 2021-08-28 MED ORDER — TIZANIDINE HCL 4 MG PO TABS
4.0000 mg | ORAL_TABLET | Freq: Once | ORAL | Status: AC
  Administered 2021-08-28: 4 mg via ORAL
  Filled 2021-08-28: qty 1

## 2021-08-28 MED ORDER — OXYCODONE HCL 5 MG PO TABS: 5.0000 mg | ORAL_TABLET | Freq: Four times a day (QID) | ORAL | 0 refills | Status: DC

## 2021-08-28 MED ORDER — OXYCODONE HCL 5 MG PO TABS: 2.5000 mg | ORAL_TABLET | Freq: Four times a day (QID) | ORAL | 0 refills | Status: DC | PRN

## 2021-08-28 MED ORDER — DOXYCYCLINE HYCLATE 100 MG PO TABS: 100.0000 mg | ORAL_TABLET | Freq: Two times a day (BID) | ORAL | 0 refills | Status: AC

## 2021-08-28 MED ORDER — OXYCODONE HCL 5 MG PO TABS
2.5000 mg | ORAL_TABLET | Freq: Once | ORAL | Status: AC
Start: 2021-08-28 — End: 2021-08-28
  Administered 2021-08-28: 2.5 mg via ORAL
  Filled 2021-08-28: qty 1

## 2021-08-28 MED ORDER — CALCIUM CARBONATE 1250 (500 CA) MG PO TABS
2.0000 | ORAL_TABLET | Freq: Every day | ORAL | Status: DC
Start: 2021-08-29 — End: 2023-01-26

## 2021-08-28 NOTE — TOC Progression Note (Addendum)
Transition of Care (TOC) - Progression Note  ? ? ?Patient Details  ?Name: [name] ?MRN: [id number] ?Date of Birth: [birth year] ? ?Transition of Care (TOC) CM/SW Contact  ?Joanne Chars, LCSW ?Phone Number: ?08/28/2021, 10:44 AM ? ?Clinical Narrative:   Auth approved by [facility], 7 days: [id number].  Previously, Ambulance transport was approved: [id number].  MD informed.  [facility] informed and can receive pt today.  CSW spoke with pt wife and informed her as well. ? ? ? ?Expected Discharge Plan: [place] (declined SNF placement) ?Barriers to Discharge: Continued Medical Work up ? ?Expected Discharge Plan and Services ?Expected Discharge Plan: [place] (declined SNF placement) ?  ?Discharge Planning Services: CM Consult ?  ?  ?                ?  ?  ?  ?  ?  ?HH Arranged: PT, OT, Social Work, Nurse's Aide ?[place] Agency: [facility] ?Date HH Agency Contacted: 08/21/21 ?Time [place]: 1655 ?Representative spoke with at [place]: Tommi Rumps ? ? ?Social Determinants of Health (SDOH) Interventions ?  ? ?Readmission Risk Interventions ?   ? View : No data to display.  ?  ?  ?  ? ? ?

## 2021-08-28 NOTE — Progress Notes (Signed)
This chaplain is present for F/U spiritual care with the Pt. and Pt. wife-[name]. The Pt. shares rest is coming easier with "some" reduction of pain. [name] interprets the Pt. ability to dream and recall his dreams as the Pt. is sleeping better.  The chaplain understands the Pt. ate his oatmeal this morning, but needs assistance with eating. ? ?The Pt. and [name] anticipate moving to SNF today. The chaplain moved away from the Pt. and listened reflectively to [name] as she expressed her emotions about the move to [facility]. [name] revisited the sources of her personal strength and sources of family and friend support.  The chaplain understands [name] is hopeful the Pt. dog can visit at [facility].  ? ?The chaplain stepped away as [name] began a conversation with the Pt. son-CL on the phone. The chaplain's communication of intercessory prayer was shared with [name] as the Pt. slept. ? ?Chaplain Sallyanne Kuster ?[phone] ? ?

## 2021-08-28 NOTE — Progress Notes (Signed)
Report given to [facility]. ?

## 2021-08-28 NOTE — Progress Notes (Signed)
Physical Therapy Treatment ?Patient Details ?Name: [name] ?MRN: [id number] ?DOB: [birth year] ?Today's Date: 08/28/2021 ? ? ?History of Present Illness Pt is a 76 y.o. male admitted 08/15/21 after fall sustaining comminuted displaced vertical basicervical L femoral neck fx. S/p L THA 3/13. Course complicated by suspicion for developing PNA, significant LLE pain. Repeat L hip xray 3/21 shows no acute findings. PMH includes progressive supranuclear palsy, afib, COPD, HLD, CAD, CVAs (residual L-side weakness), IBS. ?  ?PT Comments  ? ? Pt not progressing with mobility this session; pt received drenched in sweat, decreased responsiveness; quick neuro assessment with no focal acute deficits compared to pt's baseline during prior sessions, especially as pt becoming more awake/alert; RN called to assess, reports pre-medication (pain meds, muscle relaxer). Pt requiring maxA+2 for bed-level mobility, repositioning for pressure relief; good tolerance for BLE PROM. Continue to recommend SNF-level therapies to maximize functional mobility and decrease caregiver burden. ? ?Pre BP 102/60 ?Post BP 94/58 ?HR 60s ?SpO2 96% on RA  ?   ?Recommendations for follow up therapy are one component of a multi-disciplinary discharge planning process, led by the attending physician.  Recommendations may be updated based on patient status, additional functional criteria and insurance authorization. ? ?Follow Up Recommendations ? Skilled nursing-short term rehab (<3 hours/day) ?  ?  ?Assistance Recommended at Discharge Frequent or constant Supervision/Assistance  ?Patient can return home with the following Two people to help with walking and/or transfers;Two people to help with bathing/dressing/bathroom;Direct supervision/assist for medications management;Help with stairs or ramp for entrance;Assist for transportation;Assistance with cooking/housework;Direct supervision/assist for financial management;Assistance with feeding ?  ?Equipment  Recommendations ?  (defer to next venue)  ?  ?Recommendations for Other Services   ? ? ?  ?Precautions / Restrictions Precautions ?Precautions: Fall;Other (comment) ?Precaution Comments: L hip JP drain ?Restrictions ?Weight Bearing Restrictions: Yes ?LLE Weight Bearing: Weight bearing as tolerated  ?  ? ?Mobility ? Bed Mobility ?Overal bed mobility: Needs Assistance ?Bed Mobility: Rolling ?Rolling: Max assist, +2 for safety/equipment ?  ?  ?  ?  ?General bed mobility comments: MaxA+2 for BLE management, pt minimally assisting with RLE movement and RUE support on bed rail, use of pad to faciliate rolling ?  ? ?Transfers ?  ?  ?  ?  ?  ?  ?  ?  ?  ?General transfer comment: deferred secondary to pt lethargy ?  ? ?Ambulation/Gait ?  ?  ?  ?  ?  ?  ?  ?  ? ? ?Stairs ?  ?  ?  ?  ?  ? ? ?Wheelchair Mobility ?  ? ?Modified Rankin (Stroke Patients Only) ?  ? ? ?  ?Balance   ?  ?  ?  ?  ?  ?  ?  ?  ?  ?  ?  ?  ?  ?  ?  ?  ?  ?  ?  ? ?  ?Cognition Arousal/Alertness: Lethargic, Suspect due to medications, Awake/alert ?Behavior During Therapy: Flat affect ?Overall Cognitive Status: History of cognitive impairments - at baseline ?Area of Impairment: Attention, Following commands, Awareness, Problem solving, Memory, Safety/judgement ?  ?  ?  ?  ?  ?  ?  ?  ?  ?Current Attention Level: Sustained ?Memory: Decreased short-term memory ?Following Commands: Follows one step commands inconsistently, Follows one step commands with increased time ?Safety/Judgement: Decreased awareness of deficits ?Awareness: Emergent ?Problem Solving: Slow processing, Difficulty sequencing, Requires verbal cues, Requires tactile cues ?General Comments: more lethargic and  less interactive today, suspect medication-related (RN reports receiving double dose of pain med, as well as muscle relaxer) ?  ?  ? ?  ?Exercises Other Exercises ?Other Exercises: PROM bilateral ankle pumps, calf stretch, knee flex, hip abd/add ? ?  ?General Comments General comments  (skin integrity, edema, etc.): Pt's wife in room, anxious but appreciative of therapy intervention. pt received drenched in sweat, minimally responsive; stroke screen with no focal acute deficits, especially as pt becoming more awake/alert; RN called to assess. Pre BP 102/60, Post BP 94/58, HR 60s, SpO2 96% on RA ?  ?  ? ?Pertinent Vitals/Pain Pain Assessment ?Pain Assessment: Faces ?Faces Pain Scale: Hurts a little bit ?Pain Location: L hip ?Pain Descriptors / Indicators: Discomfort, Grimacing, Guarding ?Pain Intervention(s): Premedicated before session, Monitored during session  ? ? ?Home Living   ?  ?  ?  ?  ?  ?  ?  ?  ?  ?   ?  ?Prior Function    ?  ?  ?   ? ?PT Goals (current goals can now be found in the care plan section) Progress towards PT goals: Not progressing toward goals - comment (fatigue/lethargy) ? ?  ?Frequency ? ? ? Min 3X/week ? ? ? ?  ?PT Plan Current plan remains appropriate  ? ? ?Co-evaluation PT/OT/SLP Co-Evaluation/Treatment: Yes ?Reason for Co-Treatment: Complexity of the patient's impairments (multi-system involvement);Necessary to address cognition/behavior during functional activity;For patient/therapist safety;To address functional/ADL transfers ?PT goals addressed during session: Strengthening/ROM;Mobility/safety with mobility ?OT goals addressed during session: ADL's and self-care;Strengthening/ROM ?  ? ?  ?AM-PAC PT "6 Clicks" Mobility   ?Outcome Measure ? Help needed turning from your back to your side while in a flat bed without using bedrails?: Total ?Help needed moving from lying on your back to sitting on the side of a flat bed without using bedrails?: Total ?Help needed moving to and from a bed to a chair (including a wheelchair)?: Total ?Help needed standing up from a chair using your arms (e.g., wheelchair or bedside chair)?: Total ?Help needed to walk in hospital room?: Total ?Help needed climbing 3-5 steps with a railing? : Total ?6 Click Score: 6 ? ?  ?End of Session    ?Activity Tolerance: Patient limited by fatigue;Patient limited by lethargy ?Patient left: with call bell/phone within reach;in bed;with family/visitor present ?Nurse Communication: Mobility status;Need for lift equipment ?PT Visit Diagnosis: Other symptoms and signs involving the nervous system (R29.898);Pain;History of falling (Z91.81);Muscle weakness (generalized) (M62.81) ?Pain - Right/Left: Left ?Pain - part of body: Hip ?  ? ? ?Time: 0158-6825 ?PT Time Calculation (min) (ACUTE ONLY): 34 min ? ?Charges:  $Therapeutic Activity: 8-22 mins          ?          ? ?Mabeline Caras, PT, DPT ?Acute Rehabilitation Services  ?Pager [phone] ?Office [phone] ? ?Derry Lory ?08/28/2021, 2:02 PM ? ?

## 2021-08-28 NOTE — TOC Transition Note (Signed)
Transition of Care (TOC) - CM/SW Discharge Note ? ? ?Patient Details  ?Name: [name] ?MRN: [id number] ?Date of Birth: 23-Aug-1945 ? ?Transition of Care [facility]) CM/SW Contact:  ?Joanne Chars, LCSW ?Phone Number: ?08/28/2021, 2:11 PM ? ? ?Clinical Narrative:  Pt discharging to [facility].  RN call report to [phone].  ? ? ? ?Final next level of care: [place] ?Barriers to Discharge: Barriers Resolved ? ? ?Patient Goals and CMS Choice ?  ?  ?Choice offered to / list presented to : Spouse ? ?Discharge Placement ?  ?           ?Patient chooses bed at:  ([facility]) ?Patient to be transferred to facility by: [facility] ?Name of family member notified: wife [name] in room, also spoke with son Pradeep by phone ?Patient and family notified of of transfer: 08/28/21 ? ?Discharge Plan and Services ?  ?Discharge Planning Services: CM Consult ?           ?  ?  ?  ?  ?  ?HH Arranged: PT, OT, Social Work, Nurse's Aide ?[place] Agency: [facility] ?Date HH Agency Contacted: 08/21/21 ?Time [place]: 4970 ?Representative spoke with at [place]: Tommi Rumps ? ?Social Determinants of Health (SDOH) Interventions ?  ? ? ?Readmission Risk Interventions ?   ? View : No data to display.  ?  ?  ?  ? ? ? ? ? ?

## 2021-08-28 NOTE — Progress Notes (Signed)
Occupational Therapy Treatment ?Patient Details ?Name: [name] ?MRN: [id number] ?DOB: [birth year] ?Today's Date: 08/28/2021 ? ? ?History of present illness Pt is a 76 y.o. male admitted 08/15/21 after fall sustaining comminuted displaced vertical basicervical L femoral neck fx. S/p L THA 3/13. Course complicated by suspicion for developing PNA, significant LLE pain. Repeat L hip xray 3/21 shows no acute findings. PMH includes progressive supranuclear palsy, afib, COPD, HLD, CAD, CVAs (residual L-side weakness), IBS. ?  ?OT comments ? Pt not progressing towards OT goals this session, session limited by medication and lethargy of patient. When OT/PT entered the room, Pt profusely sweating and difficult to arouse. Quick neuro assessment revealed similar status of previous session, RN informing of pre-medication. Pt max A for bed level bath as well as changing to new gown. Rolling at bed level for new linens - Pt participating in assisting with rolling with R side and LUE - PT performing PROM for LE with more success from previous sessions. Pt's wife present throughout session. OT will continue to follow acutely. POC remains appropriate at this time.   ? ?Recommendations for follow up therapy are one component of a multi-disciplinary discharge planning process, led by the attending physician.  Recommendations may be updated based on patient status, additional functional criteria and insurance authorization. ?   ?Follow Up Recommendations ? Skilled nursing-short term rehab (<3 hours/day)  ?  ?Assistance Recommended at Discharge Frequent or constant Supervision/Assistance  ?Patient can return home with the following ? Two people to help with bathing/dressing/bathroom;Two people to help with walking and/or transfers;Assistance with feeding;Direct supervision/assist for medications management;Direct supervision/assist for financial management;Assist for transportation;Help with stairs or ramp for entrance;Assistance with  cooking/housework ?  ?Equipment Recommendations ? None recommended by OT  ?  ?Recommendations for Other Services   ? ?  ?Precautions / Restrictions Precautions ?Precautions: Fall ?Precaution Comments: L hip JP drain ?Restrictions ?Weight Bearing Restrictions: Yes ?LLE Weight Bearing: Weight bearing as tolerated  ? ? ?  ? ?Mobility Bed Mobility ?Overal bed mobility: Needs Assistance ?Bed Mobility: Rolling ?Rolling: Max assist, +2 for safety/equipment ?  ?  ?  ?  ?General bed mobility comments: MaxA+2 for BLE management, pt minimally assisting with RLE movement and RUE support on bed rail, use of pad to faciliate rolling ?  ? ?Transfers ?  ?  ?  ?  ?  ?  ?  ?  ?  ?General transfer comment: NT this session ?  ?  ?Balance   ?  ?  ?  ?  ?  ?  ?  ?  ?  ?  ?  ?  ?  ?  ?  ?  ?  ?  ?   ? ?ADL either performed or assessed with clinical judgement  ? ?ADL Overall ADL's : Needs assistance/impaired ?Eating/Feeding: Maximal assistance;Bed level ?Eating/Feeding Details (indicate cue type and reason): to drink out of cup at end of session ?Grooming: Wash/dry face;Maximal assistance;Bed level ?  ?Upper Body Bathing: Maximal assistance;Bed level ?Upper Body Bathing Details (indicate cue type and reason): to cool down ?Lower Body Bathing: Maximal assistance;Bed level ?Lower Body Bathing Details (indicate cue type and reason): to cool down ?  ?  ?  ?  ?  ?  ?  ?  ?  ?  ?  ?  ?  ? ?Extremity/Trunk Assessment   ?  ?  ?  ?  ?  ? ?Vision   ?  ?  ?Perception   ?  ?Praxis   ?  ? ?  Cognition Arousal/Alertness: Awake/alert ?Behavior During Therapy: Flat affect ?Overall Cognitive Status: History of cognitive impairments - at baseline ?Area of Impairment: Attention, Following commands, Awareness, Problem solving ?  ?  ?  ?  ?  ?  ?  ?  ?  ?Current Attention Level: Sustained ?Memory: Decreased short-term memory ?Following Commands: Follows one step commands inconsistently, Follows one step commands with increased time ?  ?Awareness:  Emergent ?Problem Solving: Slow processing, Difficulty sequencing, Requires verbal cues, Requires tactile cues ?General Comments: more lethargic and less interactive today, suspect medication ?  ?  ?   ?Exercises   ? ?  ?Shoulder Instructions   ? ? ?  ?General Comments Pt's wife in room, anxious but appreciative of therapy intervention  ? ? ?Pertinent Vitals/ Pain       Pain Assessment ?Pain Assessment: Faces ?Faces Pain Scale: Hurts a little bit ?Pain Location: L hip ?Pain Descriptors / Indicators: Discomfort, Grimacing, Guarding ?Pain Intervention(s): Monitored during session, Repositioned, Premedicated before session ? ?Home Living   ?  ?  ?  ?  ?  ?  ?  ?  ?  ?  ?  ?  ?  ?  ?  ?  ?  ?  ? ?  ?Prior Functioning/Environment    ?  ?  ?  ?   ? ?Frequency ? Min 2X/week  ? ? ? ? ?  ?Progress Toward Goals ? ?OT Goals(current goals can now be found in the care plan section) ? Progress towards OT goals: Not progressing toward goals - comment (suspect medication) ? ?Acute Rehab OT Goals ?Patient Stated Goal: none stated today ?OT Goal Formulation: With patient ?Time For Goal Achievement: 09/03/21 ?Potential to Achieve Goals: Fair  ?Plan Discharge plan remains appropriate   ? ?Co-evaluation ? ? ? PT/OT/SLP Co-Evaluation/Treatment: Yes ?Reason for Co-Treatment: Complexity of the patient's impairments (multi-system involvement);Necessary to address cognition/behavior during functional activity;For patient/therapist safety;To address functional/ADL transfers ?PT goals addressed during session: Strengthening/ROM;Mobility/safety with mobility ?OT goals addressed during session: ADL's and self-care;Strengthening/ROM ?  ? ?  ?AM-PAC OT "6 Clicks" Daily Activity     ?Outcome Measure ? ? Help from another person eating meals?: A Little ?Help from another person taking care of personal grooming?: A Lot ?Help from another person toileting, which includes using toliet, bedpan, or urinal?: Total ?Help from another person bathing  (including washing, rinsing, drying)?: Total ?Help from another person to put on and taking off regular upper body clothing?: Total ?Help from another person to put on and taking off regular lower body clothing?: Total ?6 Click Score: 9 ? ?  ?End of Session   ? ?OT Visit Diagnosis: Unsteadiness on feet (R26.81);Other abnormalities of gait and mobility (R26.89);Muscle weakness (generalized) (M62.81);History of falling (Z91.81);Pain ?Pain - Right/Left: Left ?Pain - part of body: Hip ?  ?Activity Tolerance Patient limited by lethargy ?  ?Patient Left in bed;with call bell/phone within reach;with bed alarm set;with family/visitor present ?  ?Nurse Communication Mobility status ?  ? ?   ? ?Time: 1791-5056 ?OT Time Calculation (min): 34 min ? ?Charges: OT General Charges ?$OT Visit: 1 Visit ?OT Treatments ?$Self Care/Home Management : 8-22 mins ? ?Jesse Sans OTR/L ?Acute Rehabilitation Services ?Pager: [phone] ?Office: [phone] ? ?Merri Ray Legend [place] ?08/28/2021, 1:53 PM ?

## 2021-08-28 NOTE — Discharge Summary (Addendum)
Family Medicine Teaching Service ?Hospital Discharge Summary ? ?Patient name: [name] Medical record number: [id number] ?Date of birth: 14-Mar-1946 Age: 76 y.o. Gender: male ?Date of Admission: 08/15/2021  Date of Discharge: 08/28/2021 ?Admitting Physician: Wells Guiles, DO ? ?Primary Care Provider: [name], MD ?Consultants: Orthopedic surgery ? ?Indication for Hospitalization: Left hip fracture ? ?Discharge Diagnoses/Problem List:  ?Principal Problem: ?  Closed fracture of neck of left femur ([place]) ?Active Problems: ?  Constipation ?  Leg fracture, left ?  Status post total hip replacement, left ?  AKI (acute kidney injury) ([place]) ?  ? ?Disposition: SNF ? ?Discharge Condition: Stable ? ?Discharge Exam:  ?Blood pressure (!) 92/59, pulse (!) 56, temperature 97.8 ?F (36.6 ?C), temperature source Oral, resp. rate 17, height 6' (1.829 m), weight 86.5 kg, SpO2 97 %.  ?General: A&Ox4, NAD ?CV: RRR, no murmurs auscultated ?Pulm: CTAB, no increased work of breathing, cough appreciated ?Extremities: no pitting edema, dry dressing in place, minimal serosanguinous blood in drainage bulb, sensation intact, no pitting edema ? ?Brief Hospital Course:  ?[name] is a 76 y.o. male presenting with recurrent falls and consequent left femoral fracture . PMH is significant for progressive supranuclear palsy (PSP), paroxysmal A-fib on Coumadin, CAD s/p stent (2017), MI (2007), CVA x2, COPD, GERD, HTN, HLD, BPH. ? ?Left femoral neck fracture ?Pt presented to the ED hemodynamically stable after fall in which he hit his head due to residual left-sided weakness from prior stroke and worsening balance due to progressive supranuclear palsy (PSP). Head CT was normal and L hip x ray showed irregularity of the neck and greater trochanter of the L hip. CT pelvis showed mildly comminuted and mildly angulated fracture of the left femoral neck. Orthopedic surgery consulted and performed total hip arthroplasty on 3/13. Pt was  discharged with hip drain still in place to follow-up with ortho outpatient to have removed. Pt did struggle with delirium correlated to poor pain control given negative workup during hospitalization. His alertness waxed and waned. Discharged with pain medications of tylenol 1g q6h, oxy IR '5mg'$  q6h, Oxy IR 2.'5mg'$  q6h PRN. Additionally has zanaflex and mirapex PRN for leg spasms. Pt was A&Ox4 on the day of discharge. ? ?Hospital-acquired pneumonia ?Chest x-ray showed new area of subtle opacity in the left mid chest suspicious for pneumonia.  Patient was started on doxycycline p.o every 12 hours.  Patient was afebrile and WBC WNL. Discharged with doxycyline for total 5 day treatment to finish on 3/25.  ? ?Progressive Supranuclear Palsy ?Pt uses a walker, has recent increase in falls. Palliative care was consulted and was able to early advanced care planning as well as additional DME such as lift equipment.  Per PT/OT eval, patient was recommended for SNF placement. ? ?PAFib  ?Home medications (Amiodarone, bisoprolol and bASA) held in setting of likely surgery. Bisoprolol and amiodarone restarted and patient remained rate-controlled throughout. Warfarin was discontinued and anticoagulation discussion to be held in the outpatient setting given risks vs. Benefits of Afib. vs. Increased risk of falls. Continued on Amiodarone and bisoprolol and patient is rhythm and rate controlled. Given prognosis, may consider decreasing amiodarone given risks of pulmonary effects. ? ?PCP Follow up recommendations: ?Anticoagulation discussion ?Recheck CBC ?Ortho outpatient follow up for hip drain ?Consider discussing lowering amiodarone dose given long term prognosis ?F/u CXR for clearing of opacity ? ?Significant Procedures: Left hip total arthroplasty ? ?Significant Labs and Imaging:  ?Recent Labs  ?Lab 08/25/21 ?0252 08/26/21 ?0312 08/27/21 ?0719  ?WBC 8.0 9.0 8.3  ?  HGB 9.2* 9.8* 9.3*  ?HCT 27.2* 28.7* 28.7*  ?PLT 258 291 286  ? ?Recent  Labs  ?Lab 08/22/21 ?0243 08/23/21 ?0301 08/24/21 ?0215 08/27/21 ?0719  ?NA 138 141 137 138  ?K 3.8 3.7 3.7 3.7  ?CL 109 110 106 105  ?CO2 21* 21* 23 26  ?GLUCOSE 110* 124* 144* 113*  ?BUN 30* '20 15 14  '$ ?CREATININE 1.26* 1.08 0.96 0.98  ?CALCIUM 8.2* 8.3* 8.2* 8.6*  ? ?Results/Tests Pending at Time of Discharge: None ? ?Discharge Medications:  ?Allergies as of 08/28/2021   ? ?   Reactions  ? Bactrim [sulfamethoxazole-trimethoprim] Anaphylaxis  ? Ulcers  ? Ciprofloxacin Swelling  ? Tongue  ? Crestor [rosuvastatin Calcium] Other (See Comments)  ? Severe Myalgias  ? ?  ? ?  ?Medication List  ?  ? ?STOP taking these medications   ? ?aspirin EC 81 MG tablet ?  ?lisinopril 40 MG tablet ?Commonly known as: ZESTRIL ?  ?ondansetron 4 MG disintegrating tablet ?Commonly known as: ZOFRAN-ODT ?  ?warfarin 7.5 MG tablet ?Commonly known as: COUMADIN ?  ? ?  ? ?TAKE these medications   ? ?acetaminophen 500 MG tablet ?Commonly known as: TYLENOL ?Take 1,000 mg by mouth every 8 (eight) hours as needed for headache (pain). ?  ?amiodarone 200 MG tablet ?Commonly known as: PACERONE ?Take 1 tablet (200 mg total) by mouth daily. ?What changed: when to take this ?  ?amLODipine 2.5 MG tablet ?Commonly known as: NORVASC ?Take 1 tablet (2.5 mg total) by mouth daily. ?What changed: when to take this ?  ?bisoprolol 10 MG tablet ?Commonly known as: ZEBETA ?TAKE 1/2 TABLET BY MOUTH DAILY ?What changed: when to take this ?  ?calcium carbonate 1250 (500 Ca) MG tablet ?Commonly known as: OS-CAL - dosed in mg of elemental calcium ?Take 2 tablets (1,000 mg of elemental calcium total) by mouth daily with breakfast. ?Start taking on: August 29, 2021 ?  ?citalopram 10 MG tablet ?Commonly known as: CELEXA ?Take 20 mg by mouth daily with breakfast. ?  ?doxycycline 100 MG tablet ?Commonly known as: VIBRA-TABS ?Take 1 tablet (100 mg total) by mouth every 12 (twelve) hours for 2 days. ?  ?fluticasone 50 MCG/ACT nasal spray ?Commonly known as: FLONASE ?Place 1  spray into both nostrils at bedtime as needed (stuffy nose). ?  ?multivitamin with minerals tablet ?Take 1 tablet by mouth daily with supper. Centrum Silver ?  ?nitroGLYCERIN 0.4 MG SL tablet ?Commonly known as: NITROSTAT ?Place 1 tablet (0.4 mg total) every 5 (five) minutes as needed under the tongue for chest pain. ?  ?omeprazole 40 MG capsule ?Commonly known as: PRILOSEC ?TAKE 1 CAPSULE (40 MG TOTAL) BY MOUTH 2 (TWO) TIMES DAILY BEFORE A MEAL. FOR HEARTBURN. ?What changed:  ?how much to take ?when to take this ?additional instructions ?  ?oxyCODONE 5 MG immediate release tablet ?Commonly known as: Oxy IR/ROXICODONE ?Take 0.5 tablets (2.5 mg total) by mouth every 6 (six) hours as needed for severe pain. ?  ?oxyCODONE 5 MG immediate release tablet ?Commonly known as: Oxy IR/ROXICODONE ?Take 1 tablet (5 mg total) by mouth every 6 (six) hours. ?  ?polyethylene glycol 17 g packet ?Commonly known as: MIRALAX / GLYCOLAX ?Take 17 g by mouth daily as needed (constipation). ?  ?Praluent 75 MG/ML Soaj ?Generic drug: Alirocumab ?INJECT 75 MG INTO THE SKIN EVERY 14 (FOURTEEN) DAYS. ?  ?pramipexole 0.25 MG tablet ?Commonly known as: Mirapex ?Take 1 tablet (0.25 mg total) by mouth at bedtime. ?What changed:  ?  when to take this ?reasons to take this ?  ?pravastatin 80 MG tablet ?Commonly known as: PRAVACHOL ?Take 80 mg by mouth daily with breakfast. For cholesterol. ?  ?senna 8.6 MG Tabs tablet ?Commonly known as: SENOKOT ?Take 1 tablet (8.6 mg total) by mouth daily. For constipation ?What changed:  ?when to take this ?reasons to take this ?additional instructions ?  ?solifenacin 5 MG tablet ?Commonly known as: VESICARE ?Take 5 mg by mouth daily with breakfast. ?  ?tiZANidine 4 MG tablet ?Commonly known as: ZANAFLEX ?TAKE 1 TABLET BY MOUTH AT BEDTIME AS NEEDED FOR MUSCLE SPASMS. ?What changed: See the new instructions. ?  ?triamcinolone cream 0.1 % ?Commonly known as: KENALOG ?Apply 1 application. topically every other day.  Apply to rash around nose after showering ?  ?vitamin B-12 1000 MCG tablet ?Commonly known as: CYANOCOBALAMIN ?Take 1,000 mcg by mouth daily with supper. ?  ?Vitamin D3 50 MCG (2000 UT) Tabs ?Take 50 mcg by mouth

## 2021-08-29 ENCOUNTER — Emergency Department (HOSPITAL_COMMUNITY): Payer: PPO

## 2021-08-29 ENCOUNTER — Telehealth: Payer: Self-pay

## 2021-08-29 ENCOUNTER — Other Ambulatory Visit: Payer: Self-pay

## 2021-08-29 ENCOUNTER — Emergency Department (HOSPITAL_COMMUNITY)
Admission: EM | Admit: 2021-08-29 | Discharge: 2021-08-29 | Disposition: A | Discharge status: Home / Self Care | Payer: PPO | Attending: Emergency Medicine | Admitting: Emergency Medicine

## 2021-08-29 ENCOUNTER — Encounter (HOSPITAL_COMMUNITY): Payer: Self-pay

## 2021-08-29 DIAGNOSIS — R21 Rash and other nonspecific skin eruption: Secondary | ICD-10-CM | POA: Insufficient documentation

## 2021-08-29 DIAGNOSIS — R918 Other nonspecific abnormal finding of lung field: Secondary | ICD-10-CM | POA: Diagnosis not present

## 2021-08-29 DIAGNOSIS — Z96641 Presence of right artificial hip joint: Secondary | ICD-10-CM | POA: Insufficient documentation

## 2021-08-29 DIAGNOSIS — Z79899 Other long term (current) drug therapy: Secondary | ICD-10-CM | POA: Insufficient documentation

## 2021-08-29 DIAGNOSIS — S7292XA Unspecified fracture of left femur, initial encounter for closed fracture: Secondary | ICD-10-CM | POA: Diagnosis not present

## 2021-08-29 DIAGNOSIS — Y831 Surgical operation with implant of artificial internal device as the cause of abnormal reaction of the patient, or of later complication, without mention of misadventure at the time of the procedure: Secondary | ICD-10-CM | POA: Diagnosis not present

## 2021-08-29 DIAGNOSIS — M96842 Postprocedural seroma of a musculoskeletal structure following a musculoskeletal system procedure: Secondary | ICD-10-CM | POA: Insufficient documentation

## 2021-08-29 DIAGNOSIS — Z09 Encounter for follow-up examination after completed treatment for conditions other than malignant neoplasm: Secondary | ICD-10-CM

## 2021-08-29 DIAGNOSIS — Z4889 Encounter for other specified surgical aftercare: Secondary | ICD-10-CM | POA: Diagnosis not present

## 2021-08-29 DIAGNOSIS — I679 Cerebrovascular disease, unspecified: Secondary | ICD-10-CM | POA: Diagnosis not present

## 2021-08-29 DIAGNOSIS — T148XXA Other injury of unspecified body region, initial encounter: Secondary | ICD-10-CM

## 2021-08-29 DIAGNOSIS — I480 Paroxysmal atrial fibrillation: Secondary | ICD-10-CM | POA: Diagnosis not present

## 2021-08-29 DIAGNOSIS — I2510 Atherosclerotic heart disease of native coronary artery without angina pectoris: Secondary | ICD-10-CM | POA: Diagnosis not present

## 2021-08-29 DIAGNOSIS — Z471 Aftercare following joint replacement surgery: Secondary | ICD-10-CM | POA: Diagnosis not present

## 2021-08-29 DIAGNOSIS — Z96642 Presence of left artificial hip joint: Secondary | ICD-10-CM | POA: Diagnosis not present

## 2021-08-29 DIAGNOSIS — I700 Atherosclerosis of aorta: Secondary | ICD-10-CM | POA: Diagnosis not present

## 2021-08-29 LAB — CBC WITH DIFFERENTIAL/PLATELET
Abs Immature Granulocytes: 0.08 10*3/uL — ABNORMAL HIGH (ref 0.00–0.07)
Basophils Absolute: 0 10*3/uL (ref 0.0–0.1)
Basophils Relative: 0 %
Eosinophils Absolute: 0.1 10*3/uL (ref 0.0–0.5)
Eosinophils Relative: 1 %
HCT: 34.3 % — ABNORMAL LOW (ref 39.0–52.0)
Hemoglobin: 11.1 g/dL — ABNORMAL LOW (ref 13.0–17.0)
Immature Granulocytes: 1 %
Lymphocytes Relative: 14 %
Lymphs Abs: 1.4 10*3/uL (ref 0.7–4.0)
MCH: 31.4 pg (ref 26.0–34.0)
MCHC: 32.4 g/dL (ref 30.0–36.0)
MCV: 96.9 fL (ref 80.0–100.0)
Monocytes Absolute: 0.7 10*3/uL (ref 0.1–1.0)
Monocytes Relative: 7 %
Neutro Abs: 7.6 10*3/uL (ref 1.7–7.7)
Neutrophils Relative %: 77 %
Platelets: 386 10*3/uL (ref 150–400)
RBC: 3.54 MIL/uL — ABNORMAL LOW (ref 4.22–5.81)
RDW: 14.6 % (ref 11.5–15.5)
WBC: 9.9 10*3/uL (ref 4.0–10.5)
nRBC: 0 % (ref 0.0–0.2)

## 2021-08-29 LAB — BASIC METABOLIC PANEL
Anion gap: 7 (ref 5–15)
BUN: 11 mg/dL (ref 8–23)
CO2: 26 mmol/L (ref 22–32)
Calcium: 8.8 mg/dL — ABNORMAL LOW (ref 8.9–10.3)
Chloride: 107 mmol/L (ref 98–111)
Creatinine, Ser: 1.01 mg/dL (ref 0.61–1.24)
GFR, Estimated: >60 mL/min (ref >60)
Glucose, Bld: 140 mg/dL — ABNORMAL HIGH (ref 70–99)
Potassium: 3.6 mmol/L (ref 3.5–5.1)
Sodium: 140 mmol/L (ref 135–145)

## 2021-08-29 MED ORDER — OXYCODONE HCL 5 MG PO TABS
5.0000 mg | ORAL_TABLET | Freq: Once | ORAL | Status: AC
Start: 2021-08-29 — End: 2021-08-29
  Administered 2021-08-29: 5 mg via ORAL
  Filled 2021-08-29: qty 1

## 2021-08-29 MED ORDER — OXYCODONE-ACETAMINOPHEN 5-325 MG PO TABS
1.0000 | ORAL_TABLET | Freq: Once | ORAL | Status: AC
  Administered 2021-08-29: 1 via ORAL
  Filled 2021-08-29: qty 1

## 2021-08-29 MED ORDER — NYSTATIN 100000 UNIT/GM EX POWD
Freq: Once | CUTANEOUS | Status: AC
Start: 2021-08-29 — End: 2021-08-29
  Filled 2021-08-29: qty 15

## 2021-08-29 NOTE — ED Triage Notes (Signed)
Pt BIB [facility] from [facility] after doing PT this morning staff noticed pt was bleeding into his JP drain and around the drain on his left hip. Pt had drain placed after having a hip replacement.  ?

## 2021-08-29 NOTE — Telephone Encounter (Signed)
Patients wife calls nurse line reporting they are back in ED after "bloody drainage" was noted in his JP drain.  ? ?Wife stated he was seen by triage, however placed in a lawn chair in the waiting room to wait for a bed.  ? ?Wife stated, "I figured I would call you people since you kicked him out of the hospital in the first place when he was not ready to go."  ? ?Wife advised they are in the best possible place and voiced sympathy for the ED wait.  ? ?Wife stated, "thanks again for proving [facility] sucks."  ? ?Phone call was disconnected.  ?

## 2021-08-29 NOTE — Progress Notes (Deleted)
? ?Cardiology Office Note   ? ?Date:  08/29/2021  ? ?ID:  [name], DOB 09-02-1945, MRN [id number] ? ? ?PCP:  [name], MD ?  ?[place]  ?Cardiologist:  Larae Grooms, MD *** ?Advanced Practice Provider:  No care team member to display ?Electrophysiologist:  None  ? ?[id number]}  ? ?No chief complaint on file. ? ? ?History of Present Illness:  ?[name] is a 76 y.o. male with a history of CAD s/p PCI Cfx 2007, cath 05/2020 nonobstructive CAD, normal Right heart pressures,History of CVA treated with PTA with mild residual left sided weakness, mini stroke  2015 seen on PET scan on coumadin, PAF on Amiodarone, HTN, Falls. ? ?Patient last saw Dr. Irish Lack 10/2020 and had some falls and balance issues. Asked to f/u with PCP. If BP goes up he recommended adding amlodipine 2.5 mg daily. ? ? ? ? ?Past Medical History:  ?Diagnosis Date  ? BPH (benign prostatic hyperplasia)   ? COPD (chronic obstructive pulmonary disease) ([place]) 2021  ? Coronary artery disease   ? a. s/p PCI to LCx in 2007; b. MV 2017 no sig ischemia, EF 59%, nl study. c. Cath 01/2020 without significant recurrent dz.  ? Depression   ? GERD (gastroesophageal reflux disease)   ? Hyperlipidemia   ? Hypertension   ? IBS (irritable bowel syndrome)   ? Myocardial infarction [facility])   ? 12-13 yrs ago   ? PAF (paroxysmal atrial fibrillation) ([place])   ? a. CHADS2VASc => 5 (HTN, age x 1, stroke x 2, vascular disease); b. on Coumadin   ? PNA (pneumonia)   ? Skin cancer   ? face   ? Sleep apnea   ? off cpap   ? Stroke [facility])   ? a. x 2 with residual left-sided weakness  ? ? ?Past Surgical History:  ?Procedure Laterality Date  ? ADENOIDECTOMY  1951  ? CARDIAC CATHETERIZATION  2006  ? X1 STENT  ? COLONOSCOPY    ? CORONARY ANGIOPLASTY    ? POLYPECTOMY    ? RIGHT/LEFT HEART CATH AND CORONARY ANGIOGRAPHY N/A 01/25/2020  ? Procedure: RIGHT/LEFT HEART CATH AND CORONARY ANGIOGRAPHY;  Surgeon: Minna Merritts, MD;  Location: [facility];  Service: Cardiovascular;  Laterality: N/A;  ? SKIN CANCER EXCISION N/A 08/2016  ? Forehead.  [facility] (Dr. Deliah Boston)  ? THYROIDECTOMY, PARTIAL    ? TOTAL HIP ARTHROPLASTY Left 08/18/2021  ? Procedure: TOTAL HIP ARTHROPLASTY ANTERIOR APPROACH;  Surgeon: Rod Can, MD;  Location: [facility];  Service: Orthopedics;  Laterality: Left;  ? UPPER GASTROINTESTINAL ENDOSCOPY    ? VASCULAR SURGERY    ? varicose vein stripping left leg  ? ? ?Current Medications: ?No outpatient medications have been marked as taking for the 09/03/21 encounter (Appointment) with Imogene Burn, PA-C.  ?  ? ?Allergies:   Bactrim [sulfamethoxazole-trimethoprim], Ciprofloxacin, and Crestor [rosuvastatin calcium]  ? ?Social History  ? ?Socioeconomic History  ? Marital status: Married  ?  Spouse name: Not on file  ? Number of children: 1  ? Years of education: Not on file  ? Highest education level: Not on file  ?Occupational History  ? Occupation: retired  ?Tobacco Use  ? Smoking status: Former  ?  Years: 20.00  ?  Types: Cigarettes  ? Smokeless tobacco: Never  ?Vaping Use  ? Vaping Use: Never used  ?Substance and Sexual Activity  ? Alcohol use: Yes  ?  Alcohol/week: 2.0 - 3.0  standard drinks  ?  Types: 2 - 3 Glasses of wine per week  ?  Comment: Maybe a glass or two a week  ? Drug use: No  ? Sexual activity: Never  ?Other Topics Concern  ? Not on file  ?Social History Narrative  ? Not on file  ? ?Social Determinants of Health  ? ?Financial Resource Strain: Not on file  ?Food Insecurity: No Food Insecurity  ? Worried About Charity fundraiser in the Last Year: Never true  ? Ran Out of Food in the Last Year: Never true  ?Transportation Needs: No Transportation Needs  ? Lack of Transportation (Medical): No  ? Lack of Transportation (Non-Medical): No  ?Physical Activity: Not on file  ?Stress: Not on file  ?Social Connections: Not on file  ?  ? ?Family History:  The patient's ***family history includes Arthritis in  his mother; Cancer in his maternal aunt and mother; Diabetes in his father; Heart Problems in his father; Heart disease in his father, paternal grandfather, and paternal grandmother; Hypertension in his father; Mental illness in his mother; Stroke in his maternal grandmother and paternal uncle; Sudden death in his maternal grandfather.  ? ?ROS:   ?Please see the history of present illness.    ?ROS All other systems reviewed and are negative. ? ? ?PHYSICAL EXAM:   ?VS:  There were no vitals taken for this visit.  ?Physical Exam  ?GEN: Well nourished, well developed, in no acute distress  ?HEENT: normal  ?Neck: no JVD, carotid bruits, or masses ?Cardiac:RRR; no murmurs, rubs, or gallops  ?Respiratory:  clear to auscultation bilaterally, normal work of breathing ?GI: soft, nontender, nondistended, + BS ?Ext: without cyanosis, clubbing, or edema, Good distal pulses bilaterally ?MS: no deformity or atrophy  ?Skin: warm and dry, no rash ?Neuro:  Alert and Oriented x 3, Strength and sensation are intact ?Psych: euthymic mood, full affect ? ?Wt Readings from Last 3 Encounters:  ?08/28/21 190 lb 11.2 oz (86.5 kg)  ?07/02/21 179 lb (81.2 kg)  ?06/13/21 181 lb (82.1 kg)  ?  ? ? ?Studies/Labs Reviewed:  ? ?EKG:  EKG is*** ordered today.  The ekg ordered today demonstrates *** ? ?Recent Labs: ?08/15/2021: ALT 55 ?08/18/2021: Magnesium 1.7 ?08/27/2021: BUN 14; Creatinine, Ser 0.98; Hemoglobin 9.3; Platelets 286; Potassium 3.7; Sodium 138  ? ?Lipid Panel ?   ?Component Value Date/Time  ? CHOL 227 (H) 03/14/2020 6606  ? TRIG 250 (H) 03/14/2020 3016  ? HDL 46 03/14/2020 0924  ? CHOLHDL 4.9 03/14/2020 0924  ? VLDL 50 (H) 03/14/2020 0109  ? LDLCALC 131 (H) 03/14/2020 3235  ? [place] 67 07/14/2019 1439  ? LDLDIRECT 64 10/16/2020 1530  ? ? ?Additional studies/ records that were reviewed today include:  ?Cath 01/2020 ?Prox Cx to Mid Cx lesion is 10% stenosed. ?The left ventricular systolic function is normal. ?LV end diastolic pressure is  normal. ?The left ventricular ejection fraction is 55-65% by visual estimate. ?There is no mitral valve regurgitation. ? ?Echo 09/2019 ?IMPRESSIONS  ? ? ? 1. Left ventricular ejection fraction, by estimation, is 60 to 65%. The  ?left ventricle has normal function. The left ventricle has no regional  ?wall motion abnormalities. Left ventricular diastolic parameters are  ?consistent with Grade I diastolic  ?dysfunction (impaired relaxation).  ? 2. Right ventricular systolic function is normal. The right ventricular  ?size is normal. There is mildly elevated pulmonary artery systolic  ?pressure. The estimated right ventricular systolic pressure is 57.3 mmHg.  ?  3. Left atrial size was mildly dilated.  ? 4. Right atrial size was mildly dilated.  ? ?FINDINGS  ?Risk Assessment/Calculations:   ?{Does this patient have ATRIAL FIBRILLATION?[phone]} ? ? ? ? ?ASSESSMENT:   ? ?No diagnosis found. ? ? ?PLAN:  ?In order of problems listed above: ? ?CAD s/p PCI Cfx 2007, cath 05/2020 nonobstructive CAD, normal Right heart pressures ? ?History of CVA treated with PTA with mild residual left sided weakness, mini stroke  2015 seen on PET scan on coumadin ? ?PAF on Amiodarone ? ?HTN ? ?Falls ? ? ? ?Shared Decision Making/Informed Consent   ?{Are you ordering a CV Procedure (e.g. stress test, cath, DCCV, TEE, etc)?   Press F2        :[id number]}  ? ? ?Medication Adjustments/Labs and Tests Ordered: ?Current medicines are reviewed at length with the patient today.  Concerns regarding medicines are outlined above.  Medication changes, Labs and Tests ordered today are listed in the Patient Instructions below. ?There are no Patient Instructions on file for this visit.  ? ?Signed, ?Ermalinda Barrios, PA-C  ?08/29/2021 10:37 AM    ?[facility] ?[address], [place], [place]  [address] ?Phone: [phone]; Fax: [phone]  ? ? ?

## 2021-08-29 NOTE — ED Notes (Signed)
Report called to [employer] by previous [facility]. [facility] arrives to transport patient. Transferred to stretcher via sheet lift. Secured to stretcher via rails x2, straps x3. No signs of distress from patient. Seen leaving lobby via stretcher with [facility] crew. ?

## 2021-08-29 NOTE — ED Provider Triage Note (Signed)
Emergency Medicine Provider Triage Evaluation Note ? ?[name] , a 76 y.o. male  was evaluated in triage.  Pt complains of continued left hip pain..  Had recent hip replacement surgery on March 13.  He says that the pain has been severe ever since then and has not gotten much worse.  He was saw his orthopedic doctor today and it was noted that he had bloody drainage from a drain placed.  He was recommended to come to the emergency department that time to be evaluated.  Patient states pain is 10 out of 10.  Denies any fevers or chills. ? ?Review of Systems  ?Positive: Hip pain ?Negative:  ? ?Physical Exam  ?BP 136/74 (BP Location: Right Arm)   Pulse 67   Temp 99 ?F (37.2 ?C) (Oral)   Resp 16   SpO2 96%  ?Gen:   Awake, no distress   ?Resp:  Normal effort  ?MSK:   Moves extremities without difficulty  ?Other:  Severe Pain to left hip. Bloody drainage in JP drain.  ? ?Medical Decision Making  ?Medically screening exam initiated at 2:10 PM.  Appropriate orders placed.  Lucia Mccreadie was informed that the remainder of the evaluation will be completed by another provider, this initial triage assessment does not replace that evaluation, and the importance of remaining in the ED until their evaluation is complete. ? ?Afebrile. Will obtain labs. Will need ortho consult likely ?  ?Adolphus Birchwood, PA-C ?08/29/21 1411 ? ?

## 2021-08-29 NOTE — ED Provider Notes (Signed)
?[place] ?Provider Note ? ? ?CSN: [id number] ?Arrival date & time: 08/29/21  1355 ? ?  ? ?History ? ?Chief Complaint  ?Patient presents with  ? Post-op Problem  ? ? ?[name] is a 76 y.o. male. ? ?Patient is a 76 year old male postop day 11 total right hip arthroplasty by orthopedic surgeon Dr. Rod Can, MD presenting for bruising incision site.  It has drain tube in place with bloody output.  Patient's wife also concern for aspiration pneumonia due to seizure, recent narcotic use, and cough.  Was also being rated for constipation during his hospitalization including several softeners and laxatives now having 3 bowel movements per meal.  Concerns expressed for rash around genitalia. ? ?The history is provided by the patient. No language interpreter was used.  ? ?  ? ?Home Medications ?Prior to Admission medications   ?Medication Sig Start Date End Date Taking? Authorizing Provider  ?acetaminophen (TYLENOL) 500 MG tablet Take 1,000 mg by mouth every 8 (eight) hours as needed for headache (pain).    [provider]  ?amiodarone (PACERONE) 200 MG tablet Take 1 tablet (200 mg total) by mouth daily. ?Patient taking differently: Take 200 mg by mouth daily with breakfast. 04/08/21   Jettie Booze, MD  ?amLODipine (NORVASC) 2.5 MG tablet Take 1 tablet (2.5 mg total) by mouth daily. ?Patient taking differently: Take 2.5 mg by mouth daily with breakfast. 06/20/21   Jettie Booze, MD  ?bisoprolol (ZEBETA) 10 MG tablet TAKE 1/2 TABLET BY MOUTH DAILY ?Patient taking differently: Take 5 mg by mouth daily with breakfast. 07/16/21   Sharion Settler, DO  ?calcium carbonate (OS-CAL - DOSED IN MG OF ELEMENTAL CALCIUM) 1250 (500 Ca) MG tablet Take 2 tablets (1,000 mg of elemental calcium total) by mouth daily with breakfast. 08/29/21   Zola Button, MD  ?Cholecalciferol (VITAMIN D3) 50 MCG (2000 UT) TABS Take 50 mcg by mouth daily with supper.    [provider]  ?citalopram (CELEXA) 10 MG tablet Take 20 mg by mouth daily with breakfast. 08/01/21   [provider]  ?doxycycline (VIBRA-TABS) 100 MG tablet Take 1 tablet (100 mg total) by mouth every 12 (twelve) hours for 2 days. 08/28/21 08/30/21  Zola Button, MD  ?fluticasone (FLONASE) 50 MCG/ACT nasal spray Place 1 spray into both nostrils at bedtime as needed (stuffy nose). 05/20/20   [provider]  ?Multiple Vitamins-Minerals (MULTIVITAMIN WITH MINERALS) tablet Take 1 tablet by mouth daily with supper. Centrum Silver    [provider]  ?nitroGLYCERIN (NITROSTAT) 0.4 MG SL tablet Place 1 tablet (0.4 mg total) every 5 (five) minutes as needed under the tongue for chest pain. 04/16/17   Leone Haven, MD  ?omeprazole (PRILOSEC) 40 MG capsule TAKE 1 CAPSULE (40 MG TOTAL) BY MOUTH 2 (TWO) TIMES DAILY BEFORE A MEAL. FOR HEARTBURN. ?Patient taking differently: 40 mg 2 (two) times daily before a meal. FOR HEARTBURN. 04/11/21   [name], MD  ?oxyCODONE (OXY IR/ROXICODONE) 5 MG immediate release tablet Take 0.5 tablets (2.5 mg total) by mouth every 6 (six) hours as needed for severe pain. 08/28/21   Wells Guiles, DO  ?oxyCODONE (OXY IR/ROXICODONE) 5 MG immediate release tablet Take 1 tablet (5 mg total) by mouth every 6 (six) hours. 08/28/21   Wells Guiles, DO  ?polyethylene glycol (MIRALAX / GLYCOLAX) 17 g packet Take 17 g by mouth daily as needed (constipation).    [provider]  ?PRALUENT 75 MG/ML SOAJ INJECT  75 MG INTO THE SKIN EVERY 14 (FOURTEEN) DAYS. 08/01/21   Jettie Booze, MD  ?pramipexole (MIRAPEX) 0.25 MG tablet Take 1 tablet (0.25 mg total) by mouth at bedtime. ?Patient taking differently: Take 0.25 mg by mouth at bedtime as needed (restless legs/ leg pain). 07/02/21   Tat, Eustace Quail, DO  ?pravastatin (PRAVACHOL) 80 MG tablet Take 80 mg by mouth daily with breakfast. For cholesterol. 03/22/20   Minna Merritts, MD  ?senna (SENOKOT) 8.6 MG TABS  tablet Take 1 tablet (8.6 mg total) by mouth daily. For constipation ?Patient taking differently: Take 1 tablet by mouth daily as needed (constipation). 03/07/21   [name], MD  ?solifenacin (VESICARE) 5 MG tablet Take 5 mg by mouth daily with breakfast. 06/20/21   [provider]  ?tiZANidine (ZANAFLEX) 4 MG tablet TAKE 1 TABLET BY MOUTH AT BEDTIME AS NEEDED FOR MUSCLE SPASMS. ?Patient taking differently: Take 4 mg by mouth at bedtime as needed for muscle spasms. 08/05/21   Lilland, Alana, DO  ?triamcinolone cream (KENALOG) 0.1 % Apply 1 application. topically every other day. Apply to rash around nose after showering    [provider]  ?vitamin B-12 (CYANOCOBALAMIN) 1000 MCG tablet Take 1,000 mcg by mouth daily with supper.    [provider]  ?   ? ?Allergies    ?Bactrim [sulfamethoxazole-trimethoprim], Ciprofloxacin, and Crestor [rosuvastatin calcium]   ? ?Review of Systems   ?Review of Systems  ?Constitutional:  Negative for chills and fever.  ?HENT:  Negative for ear pain and sore throat.   ?Eyes:  Negative for pain and visual disturbance.  ?Respiratory:  Positive for cough. Negative for shortness of breath.   ?Cardiovascular:  Negative for chest pain and palpitations.  ?Gastrointestinal:  Negative for abdominal pain and vomiting.  ?Genitourinary:  Negative for dysuria and hematuria.  ?Musculoskeletal:  Negative for arthralgias and back pain.  ?     Hip pain ?  ?Skin:  Negative for color change and rash.  ?Neurological:  Negative for seizures and syncope.  ?All other systems reviewed and are negative. ? ?Physical Exam ?Updated Vital Signs ?BP 134/80   Pulse 63   Temp 97.9 ?F (36.6 ?C) (Oral)   Resp 18   SpO2 97%  ?Physical Exam ?Vitals and nursing note reviewed.  ?Constitutional:   ?   General: He is not in acute distress. ?   Appearance: He is well-developed.  ?HENT:  ?   Head: Normocephalic and atraumatic.  ?Eyes:  ?   Conjunctiva/sclera: Conjunctivae normal.   ?Cardiovascular:  ?   Rate and Rhythm: Normal rate and regular rhythm.  ?   Heart sounds: No murmur heard. ?Pulmonary:  ?   Effort: Pulmonary effort is normal. No respiratory distress.  ?   Breath sounds: Normal breath sounds.  ?Abdominal:  ?   Palpations: Abdomen is soft.  ?   Tenderness: There is no abdominal tenderness.  ?Musculoskeletal:     ?   General: No swelling.  ?   Cervical back: Neck supple.  ?   Comments: Ecchymosis to left hip. Incisions intact and well wound well approximated. No purulent drainage. Drain in place. Bloody output. No warmth or purulent drainage.   ?Skin: ?   General: Skin is warm and dry.  ?   Capillary Refill: Capillary refill takes less than 2 seconds.  ?   Findings: Rash present.  ?Neurological:  ?   Mental Status: He is alert.  ?Psychiatric:     ?  Mood and Affect: Mood normal.  ? ? ?ED Results / Procedures / Treatments   ?Labs ?(all labs ordered are listed, but only abnormal results are displayed) ?Labs Reviewed  ?BASIC METABOLIC PANEL - Abnormal; Notable for the following components:  ?    Result Value  ? Glucose, Bld 140 (*)   ? Calcium 8.8 (*)   ? All other components within normal limits  ?CBC WITH DIFFERENTIAL/PLATELET - Abnormal; Notable for the following components:  ? RBC 3.54 (*)   ? Hemoglobin 11.1 (*)   ? HCT 34.3 (*)   ? Abs Immature Granulocytes 0.08 (*)   ? All other components within normal limits  ? ? ?EKG ?None ? ?Radiology ?DG Chest 1 View ? ?Result Date: 08/29/2021 ?CLINICAL DATA:  History of recent hip replacement with increased drain bleeding, initial encounter EXAM: CHEST  1 VIEW COMPARISON:  08/25/2021 FINDINGS: Cardiac shadow is stable. Aortic calcifications are seen. The lungs are well aerated bilaterally. Previously seen parenchymal opacity in the left lung is not well visualized on the current exam and likely represented atelectasis. No new focal infiltrate is seen. No bony abnormality is noted. IMPRESSION: No acute abnormality seen. Electronically  Signed   By: Inez Catalina M.D.   On: 08/29/2021 19:26  ? ?DG Hip Unilat With Pelvis 2-3 Views Left ? ?Result Date: 08/29/2021 ?CLINICAL DATA:  Bleeding into JP drain after doing physical therapy. EXAM: DG HIP (WITH OR WITHOUT PEL

## 2021-08-30 ENCOUNTER — Encounter (HOSPITAL_BASED_OUTPATIENT_CLINIC_OR_DEPARTMENT_OTHER): Payer: Self-pay | Admitting: Emergency Medicine

## 2021-09-01 ENCOUNTER — Telehealth: Payer: Self-pay

## 2021-09-01 ENCOUNTER — Emergency Department (HOSPITAL_COMMUNITY)
Admission: EM | Admit: 2021-09-01 | Discharge: 2021-09-02 | Disposition: A | Discharge status: Skilled Nursing Facility | Payer: PPO | Attending: Emergency Medicine | Admitting: Emergency Medicine

## 2021-09-01 ENCOUNTER — Encounter (HOSPITAL_COMMUNITY): Payer: Self-pay | Admitting: Emergency Medicine

## 2021-09-01 ENCOUNTER — Other Ambulatory Visit: Payer: Self-pay

## 2021-09-01 DIAGNOSIS — I10 Essential (primary) hypertension: Secondary | ICD-10-CM | POA: Insufficient documentation

## 2021-09-01 DIAGNOSIS — Z5189 Encounter for other specified aftercare: Secondary | ICD-10-CM

## 2021-09-01 DIAGNOSIS — I2510 Atherosclerotic heart disease of native coronary artery without angina pectoris: Secondary | ICD-10-CM | POA: Insufficient documentation

## 2021-09-01 DIAGNOSIS — Z79899 Other long term (current) drug therapy: Secondary | ICD-10-CM | POA: Diagnosis not present

## 2021-09-01 DIAGNOSIS — L89620 Pressure ulcer of left heel, unstageable: Secondary | ICD-10-CM | POA: Insufficient documentation

## 2021-09-01 DIAGNOSIS — I480 Paroxysmal atrial fibrillation: Secondary | ICD-10-CM | POA: Diagnosis not present

## 2021-09-01 DIAGNOSIS — I679 Cerebrovascular disease, unspecified: Secondary | ICD-10-CM | POA: Diagnosis not present

## 2021-09-01 DIAGNOSIS — Z4801 Encounter for change or removal of surgical wound dressing: Secondary | ICD-10-CM | POA: Insufficient documentation

## 2021-09-01 DIAGNOSIS — J449 Chronic obstructive pulmonary disease, unspecified: Secondary | ICD-10-CM | POA: Insufficient documentation

## 2021-09-01 DIAGNOSIS — J189 Pneumonia, unspecified organism: Secondary | ICD-10-CM | POA: Diagnosis not present

## 2021-09-01 DIAGNOSIS — Z4889 Encounter for other specified surgical aftercare: Secondary | ICD-10-CM | POA: Diagnosis not present

## 2021-09-01 DIAGNOSIS — S7292XA Unspecified fracture of left femur, initial encounter for closed fracture: Secondary | ICD-10-CM | POA: Diagnosis not present

## 2021-09-01 LAB — CBC
HCT: 30.6 % — ABNORMAL LOW (ref 39.0–52.0)
Hemoglobin: 9.6 g/dL — ABNORMAL LOW (ref 13.0–17.0)
MCH: 30.7 pg (ref 26.0–34.0)
MCHC: 31.4 g/dL (ref 30.0–36.0)
MCV: 97.8 fL (ref 80.0–100.0)
Platelets: 412 10*3/uL — ABNORMAL HIGH (ref 150–400)
RBC: 3.13 MIL/uL — ABNORMAL LOW (ref 4.22–5.81)
RDW: 14.6 % (ref 11.5–15.5)
WBC: 10 10*3/uL (ref 4.0–10.5)
nRBC: 0 % (ref 0.0–0.2)

## 2021-09-01 LAB — COMPREHENSIVE METABOLIC PANEL
ALT: 37 U/L (ref 0–44)
AST: 36 U/L (ref 15–41)
Albumin: 2.5 g/dL — ABNORMAL LOW (ref 3.5–5.0)
Alkaline Phosphatase: 104 U/L (ref 38–126)
Anion gap: 7 (ref 5–15)
BUN: 6 mg/dL — ABNORMAL LOW (ref 8–23)
CO2: 29 mmol/L (ref 22–32)
Calcium: 8.6 mg/dL — ABNORMAL LOW (ref 8.9–10.3)
Chloride: 104 mmol/L (ref 98–111)
Creatinine, Ser: 1.03 mg/dL (ref 0.61–1.24)
GFR, Estimated: >60 mL/min (ref >60)
Glucose, Bld: 106 mg/dL — ABNORMAL HIGH (ref 70–99)
Potassium: 3.6 mmol/L (ref 3.5–5.1)
Sodium: 140 mmol/L (ref 135–145)
Total Bilirubin: 0.4 mg/dL (ref 0.3–1.2)
Total Protein: 5.5 g/dL — ABNORMAL LOW (ref 6.5–8.1)

## 2021-09-01 MED ORDER — ACETAMINOPHEN 500 MG PO TABS
500.0000 mg | ORAL_TABLET | Freq: Four times a day (QID) | ORAL | Status: DC | PRN
  Administered 2021-09-01: 500 mg via ORAL
  Filled 2021-09-01: qty 1

## 2021-09-01 MED ORDER — OXYCODONE HCL 5 MG PO TABS
5.0000 mg | ORAL_TABLET | Freq: Once | ORAL | Status: AC
  Administered 2021-09-01: 5 mg via ORAL
  Filled 2021-09-01: qty 1

## 2021-09-01 MED ORDER — OXYCODONE-ACETAMINOPHEN 5-325 MG PO TABS: 1.0000 | ORAL_TABLET | Freq: Once | ORAL | Status: DC

## 2021-09-01 NOTE — ED Notes (Signed)
Pt brief changed. Family agitated due to wait times. Explained to family the waiting room process and stated that we are doing all that we can to ensure that the pt makes it back in a timely fashion.  ?

## 2021-09-01 NOTE — ED Provider Notes (Signed)
?[facility] ?Provider Note ? ? ?CSN: [id number] ?Arrival date & time: 09/01/21  1330 ? ?  ? ?History ? ?Chief Complaint  ?Patient presents with  ? Wound Check  ? ? ?[name] is a 76 y.o. male. ? ? ?Wound Check ?Patient presents for wound check and concerns of continued bloody output from a postop surgical drain.  Patient is currently POD 14 from a left total hip arthroplasty with anterior approach (surgeon Dr. Lyla Glassing).  He was seen in the ED 3 days ago for similar concerns.  At that time, there was normal postoperative bruising around the surgical site and bloody output from drain tube.  At that time, he was also treated for an intertriginous rash in his groin area.  Additional medical history includes HTN, depression, paroxysmal atrial fibrillation, CAD, CVA, GERD, COPD, IBS, BPH.  He reports that he has had continued improvement in pain.  He continues to take scheduled oxycodone.  Earlier today, he was able to stand and bear weight on his left leg. ? ?  ? ?Home Medications ?Prior to Admission medications   ?Medication Sig Start Date End Date Taking? Authorizing Provider  ?acetaminophen (TYLENOL) 500 MG tablet Take 1,000 mg by mouth every 8 (eight) hours as needed for headache (pain).    [provider]  ?amiodarone (PACERONE) 200 MG tablet Take 1 tablet (200 mg total) by mouth daily. ?Patient taking differently: Take 200 mg by mouth daily with breakfast. 04/08/21   Jettie Booze, MD  ?amLODipine (NORVASC) 2.5 MG tablet Take 1 tablet (2.5 mg total) by mouth daily. ?Patient taking differently: Take 2.5 mg by mouth daily with breakfast. 06/20/21   Jettie Booze, MD  ?bisoprolol (ZEBETA) 10 MG tablet TAKE 1/2 TABLET BY MOUTH DAILY ?Patient taking differently: Take 5 mg by mouth daily with breakfast. 07/16/21   Sharion Settler, DO  ?calcium carbonate (OS-CAL - DOSED IN MG OF ELEMENTAL CALCIUM) 1250 (500 Ca) MG tablet Take 2 tablets (1,000 mg of  elemental calcium total) by mouth daily with breakfast. 08/29/21   Zola Button, MD  ?Cholecalciferol (VITAMIN D3) 50 MCG (2000 UT) TABS Take 50 mcg by mouth daily with supper.    [provider]  ?citalopram (CELEXA) 10 MG tablet Take 20 mg by mouth daily with breakfast. 08/01/21   [provider]  ?fluticasone (FLONASE) 50 MCG/ACT nasal spray Place 1 spray into both nostrils at bedtime as needed (stuffy nose). 05/20/20   [provider]  ?Multiple Vitamins-Minerals (MULTIVITAMIN WITH MINERALS) tablet Take 1 tablet by mouth daily with supper. Centrum Silver    [provider]  ?nitroGLYCERIN (NITROSTAT) 0.4 MG SL tablet Place 1 tablet (0.4 mg total) every 5 (five) minutes as needed under the tongue for chest pain. 04/16/17   Leone Haven, MD  ?omeprazole (PRILOSEC) 40 MG capsule TAKE 1 CAPSULE (40 MG TOTAL) BY MOUTH 2 (TWO) TIMES DAILY BEFORE A MEAL. FOR HEARTBURN. ?Patient taking differently: 40 mg 2 (two) times daily before a meal. FOR HEARTBURN. 04/11/21   [name], MD  ?oxyCODONE (OXY IR/ROXICODONE) 5 MG immediate release tablet Take 0.5 tablets (2.5 mg total) by mouth every 6 (six) hours as needed for severe pain. 08/28/21   Wells Guiles, DO  ?oxyCODONE (OXY IR/ROXICODONE) 5 MG immediate release tablet Take 1 tablet (5 mg total) by mouth every 6 (six) hours. 08/28/21   Wells Guiles, DO  ?polyethylene glycol (MIRALAX / GLYCOLAX) 17 g packet Take 17 g by mouth daily as  needed (constipation).    [provider]  ?PRALUENT 75 MG/ML SOAJ INJECT 75 MG INTO THE SKIN EVERY 14 (FOURTEEN) DAYS. 08/01/21   Jettie Booze, MD  ?pramipexole (MIRAPEX) 0.25 MG tablet Take 1 tablet (0.25 mg total) by mouth at bedtime. ?Patient taking differently: Take 0.25 mg by mouth at bedtime as needed (restless legs/ leg pain). 07/02/21   Tat, Eustace Quail, DO  ?pravastatin (PRAVACHOL) 80 MG tablet Take 80 mg by mouth daily with breakfast. For cholesterol. 03/22/20   Minna Merritts, MD  ?senna (SENOKOT) 8.6 MG TABS tablet Take 1 tablet (8.6 mg total) by mouth daily. For constipation ?Patient taking differently: Take 1 tablet by mouth daily as needed (constipation). 03/07/21   [name], MD  ?solifenacin (VESICARE) 5 MG tablet Take 5 mg by mouth daily with breakfast. 06/20/21   [provider]  ?tiZANidine (ZANAFLEX) 4 MG tablet TAKE 1 TABLET BY MOUTH AT BEDTIME AS NEEDED FOR MUSCLE SPASMS. ?Patient taking differently: Take 4 mg by mouth at bedtime as needed for muscle spasms. 08/05/21   Lilland, Alana, DO  ?triamcinolone cream (KENALOG) 0.1 % Apply 1 application. topically every other day. Apply to rash around nose after showering    [provider]  ?vitamin B-12 (CYANOCOBALAMIN) 1000 MCG tablet Take 1,000 mcg by mouth daily with supper.    [provider]  ?   ? ?Allergies    ?Bactrim [sulfamethoxazole-trimethoprim], Ciprofloxacin, and Crestor [rosuvastatin calcium]   ? ?Review of Systems   ?Review of Systems  ?Musculoskeletal:  Positive for arthralgias (Improving).  ?Skin:  Positive for wound (Postop).  ?All other systems reviewed and are negative. ? ?Physical Exam ?Updated Vital Signs ?BP (!) 142/69   Pulse (!) 58   Temp 97.9 ?F (36.6 ?C) (Oral)   Resp 16   SpO2 95%  ?Physical Exam ?Vitals and nursing note reviewed.  ?Constitutional:   ?   General: He is not in acute distress. ?   Appearance: Normal appearance. He is well-developed and normal weight. He is not ill-appearing, toxic-appearing or diaphoretic.  ?HENT:  ?   Head: Normocephalic and atraumatic.  ?   Right Ear: External ear normal.  ?   Left Ear: External ear normal.  ?   Nose: Nose normal.  ?   Mouth/Throat:  ?   Mouth: Mucous membranes are moist.  ?   Pharynx: Oropharynx is clear.  ?Eyes:  ?   Extraocular Movements: Extraocular movements intact.  ?   Conjunctiva/sclera: Conjunctivae normal.  ?Cardiovascular:  ?   Rate and Rhythm: Normal rate and regular rhythm.  ?   Heart sounds: No murmur  heard. ?Pulmonary:  ?   Effort: Pulmonary effort is normal. No respiratory distress.  ?Abdominal:  ?   General: There is no distension.  ?   Palpations: Abdomen is soft.  ?Musculoskeletal:     ?   General: No swelling.  ?   Cervical back: Normal range of motion and neck supple.  ?   Comments: Surgicel dressing in place overlying anterior left hip.  No surrounding erythema or tenderness out of proportion.  Distal to this, there is a drain tube in place on anterolateral thigh.  There is no output in the drain bulb.  There is a small amount of sanguinous output in the drain tube.  The incision site where the drain was placed has no erythema, swelling, tenderness, or purulence.  ?Skin: ?   General: Skin is warm and dry.  ?  Capillary Refill: Capillary refill takes less than 2 seconds.  ?   Coloration: Skin is not jaundiced or pale.  ?   Comments: Pressure wound to left heel  ?Neurological:  ?   General: No focal deficit present.  ?   Mental Status: He is alert and oriented to person, place, and time.  ?   Cranial Nerves: No cranial nerve deficit.  ?   Sensory: No sensory deficit.  ?   Motor: No weakness.  ?   Coordination: Coordination normal.  ?Psychiatric:     ?   Mood and Affect: Mood normal.     ?   Behavior: Behavior normal.     ?   Thought Content: Thought content normal.     ?   Judgment: Judgment normal.  ? ? ?ED Results / Procedures / Treatments   ?Labs ?(all labs ordered are listed, but only abnormal results are displayed) ?Labs Reviewed  ?CBC - Abnormal; Notable for the following components:  ?    Result Value  ? RBC 3.13 (*)   ? Hemoglobin 9.6 (*)   ? HCT 30.6 (*)   ? Platelets 412 (*)   ? All other components within normal limits  ?COMPREHENSIVE METABOLIC PANEL - Abnormal; Notable for the following components:  ? Glucose, Bld 106 (*)   ? BUN 6 (*)   ? Calcium 8.6 (*)   ? Total Protein 5.5 (*)   ? Albumin 2.5 (*)   ? All other components within normal limits  ? ? ?EKG ?None ? ?Radiology ?No results  found. ? ?Procedures ?Procedures  ? ? ?Medications Ordered in ED ?Medications  ?acetaminophen (TYLENOL) tablet 500 mg (500 mg Oral Given 09/01/21 2116)  ?oxyCODONE-acetaminophen (PERCOCET/ROXICET) 5-325 MG per tablet 1 t

## 2021-09-01 NOTE — ED Notes (Signed)
[facility] called, 8th in line ?

## 2021-09-01 NOTE — Telephone Encounter (Signed)
Received call from patients wife.  ? ?Wife reports he is on his way to ED for the second time in 3 days. Patient was seen there on 3/24 for bloody output in drain tube post hip surgery. They sent him home with close FU with orthopedics.  ? ?Wife reports he does not have an orthopedic doctor. Surgery was done after he had an "emergent fall."  ? ?Wife is demanding we have one of our providers meet him in ED to do something about "this damn bloody draining tube."  ? ?Advised her he would need to see orthopedics per ED FU notes and again the ED is the best place for excessive bloody drainage. ? ?Will forward to PCP.  ?

## 2021-09-01 NOTE — Care Management (Signed)
RNCM consulted by [facility] RN on [employer]. Patient's spouse wanting to discuss issue with  patient needing to return for a procedure.  RNCM met with patient and wife at bedside. Spouse was appeared upset, and  RNCM  introduced myself and offered to be of assistance.  Spouse did not acknowledge my prescience.  Patient began to explain the course of their hospitalization.  The wife then spoke  berating the hospital and  the staff . When I attempted to  offer my assistance, The spouse stated , "You can't help me with anything" in a very dismissive manner. This RNCM existed the room and updated the [facility]  ?

## 2021-09-01 NOTE — ED Triage Notes (Signed)
Patient BIB [facility], staff at [facility] state wound is draining around the JP drain. ?

## 2021-09-01 NOTE — ED Provider Triage Note (Signed)
Emergency Medicine Provider Triage Evaluation Note ? [name] , a 76 y.o. male  was evaluated in triage.  Pt complains of bloody discharge noted to drain after left hip arthroplasty.  Patient reports that the physician at his nursing home sent him to the emergency department due to concerns for the bleeding.  Patient reports that he was seen for similar concerns on 3/24. ? ?Review of Systems  ?Positive: Bloody drainage from the incision site ?Negative: Fever, chills, purulent discharge ? ?Physical Exam  ?There were no vitals taken for this visit. ?Gen:   Awake, no distress   ?Resp:  Normal effort  ?MSK:   Moves extremities without difficulty  ?Other:  Drain in place to left thigh.  No erythema, streaking, swelling, or purulent discharge around wound site.  Bloody discharge is noted within patient's drain.  There is some dried blood noted to gauze surrounding wound site. ? ?Medical Decision Making  ?Medically screening exam initiated at 1:34 PM.  Appropriate orders placed.  [name] was informed that the remainder of the evaluation will be completed by another provider, this initial triage assessment does not replace that evaluation, and the importance of remaining in the ED until their evaluation is complete. ? ? ?  ?Loni Beckwith, PA-C ?09/01/21 1336 ? ?

## 2021-09-02 DIAGNOSIS — J189 Pneumonia, unspecified organism: Secondary | ICD-10-CM | POA: Diagnosis not present

## 2021-09-02 DIAGNOSIS — S7292XA Unspecified fracture of left femur, initial encounter for closed fracture: Secondary | ICD-10-CM | POA: Diagnosis not present

## 2021-09-02 DIAGNOSIS — I480 Paroxysmal atrial fibrillation: Secondary | ICD-10-CM | POA: Diagnosis not present

## 2021-09-02 DIAGNOSIS — I679 Cerebrovascular disease, unspecified: Secondary | ICD-10-CM | POA: Diagnosis not present

## 2021-09-02 MED ORDER — TIZANIDINE HCL 4 MG PO TABS: 4.0000 mg | ORAL_TABLET | Freq: Every evening | ORAL | Status: DC | PRN

## 2021-09-02 MED ORDER — AMLODIPINE BESYLATE 5 MG PO TABS: 2.5000 mg | ORAL_TABLET | Freq: Every day | ORAL | Status: DC

## 2021-09-02 MED ORDER — PANTOPRAZOLE SODIUM 40 MG PO TBEC: 40.0000 mg | DELAYED_RELEASE_TABLET | Freq: Every day | ORAL | Status: DC

## 2021-09-02 MED ORDER — AMIODARONE HCL 200 MG PO TABS: 200.0000 mg | ORAL_TABLET | Freq: Every day | ORAL | Status: DC

## 2021-09-02 MED ORDER — PRAMIPEXOLE DIHYDROCHLORIDE 0.25 MG PO TABS
0.2500 mg | ORAL_TABLET | Freq: Every evening | ORAL | Status: DC | PRN
  Filled 2021-09-02: qty 1

## 2021-09-02 MED ORDER — CITALOPRAM HYDROBROMIDE 10 MG PO TABS
20.0000 mg | ORAL_TABLET | Freq: Every day | ORAL | Status: DC
Start: 2021-09-02 — End: 2021-09-02

## 2021-09-02 MED ORDER — OXYCODONE HCL 5 MG PO TABS: 2.5000 mg | ORAL_TABLET | Freq: Four times a day (QID) | ORAL | Status: DC | PRN

## 2021-09-03 ENCOUNTER — Ambulatory Visit: Payer: PPO | Admitting: Physical Therapy

## 2021-09-03 ENCOUNTER — Ambulatory Visit: Payer: PPO | Admitting: Physician Assistant

## 2021-09-03 DIAGNOSIS — I480 Paroxysmal atrial fibrillation: Secondary | ICD-10-CM | POA: Diagnosis not present

## 2021-09-03 DIAGNOSIS — S7292XA Unspecified fracture of left femur, initial encounter for closed fracture: Secondary | ICD-10-CM | POA: Diagnosis not present

## 2021-09-05 DIAGNOSIS — I10 Essential (primary) hypertension: Secondary | ICD-10-CM | POA: Diagnosis not present

## 2021-09-05 DIAGNOSIS — I679 Cerebrovascular disease, unspecified: Secondary | ICD-10-CM | POA: Diagnosis not present

## 2021-09-05 DIAGNOSIS — Z4732 Aftercare following explantation of hip joint prosthesis: Secondary | ICD-10-CM | POA: Diagnosis not present

## 2021-09-05 DIAGNOSIS — S7292XA Unspecified fracture of left femur, initial encounter for closed fracture: Secondary | ICD-10-CM | POA: Diagnosis not present

## 2021-09-05 DIAGNOSIS — Z471 Aftercare following joint replacement surgery: Secondary | ICD-10-CM | POA: Diagnosis not present

## 2021-09-05 DIAGNOSIS — I480 Paroxysmal atrial fibrillation: Secondary | ICD-10-CM | POA: Diagnosis not present

## 2021-09-08 DIAGNOSIS — S7292XA Unspecified fracture of left femur, initial encounter for closed fracture: Secondary | ICD-10-CM | POA: Diagnosis not present

## 2021-09-09 ENCOUNTER — Ambulatory Visit: Payer: PPO | Admitting: Family Medicine

## 2021-09-10 ENCOUNTER — Ambulatory Visit: Payer: PPO | Admitting: Physical Therapy

## 2021-09-10 ENCOUNTER — Other Ambulatory Visit: Payer: Self-pay | Admitting: Family Medicine

## 2021-09-10 ENCOUNTER — Telehealth: Payer: Self-pay

## 2021-09-10 NOTE — Telephone Encounter (Signed)
Patients wife calls nurse line in regards to treatment patient has been receiving at [facility]. ? ?[name] reports they are not giving him his scheduled pain medication. [name] reports his prescription was written for every 6 hours, however unless she is there "fighting" for it he goes without.  ? ?[name] was tearful on the phone for the way he has been treated. [name] is hopefull PCP can reach out and clarify Oxycodone prescriptions. ? ?[name] is unsure of who the medical provider is there.  ? ?Will forward to PCP.   ?

## 2021-09-15 DIAGNOSIS — N401 Enlarged prostate with lower urinary tract symptoms: Secondary | ICD-10-CM | POA: Diagnosis not present

## 2021-09-15 DIAGNOSIS — M6281 Muscle weakness (generalized): Secondary | ICD-10-CM | POA: Diagnosis not present

## 2021-09-15 DIAGNOSIS — S7292XD Unspecified fracture of left femur, subsequent encounter for closed fracture with routine healing: Secondary | ICD-10-CM | POA: Diagnosis not present

## 2021-09-16 DIAGNOSIS — S7292XD Unspecified fracture of left femur, subsequent encounter for closed fracture with routine healing: Secondary | ICD-10-CM | POA: Diagnosis not present

## 2021-09-16 DIAGNOSIS — E539 Vitamin B deficiency, unspecified: Secondary | ICD-10-CM | POA: Diagnosis not present

## 2021-09-16 DIAGNOSIS — K5900 Constipation, unspecified: Secondary | ICD-10-CM | POA: Diagnosis not present

## 2021-09-16 DIAGNOSIS — N401 Enlarged prostate with lower urinary tract symptoms: Secondary | ICD-10-CM | POA: Diagnosis not present

## 2021-09-17 ENCOUNTER — Ambulatory Visit: Payer: PPO | Admitting: Physical Therapy

## 2021-09-18 DIAGNOSIS — M6281 Muscle weakness (generalized): Secondary | ICD-10-CM | POA: Diagnosis not present

## 2021-09-18 DIAGNOSIS — N401 Enlarged prostate with lower urinary tract symptoms: Secondary | ICD-10-CM | POA: Diagnosis not present

## 2021-09-18 DIAGNOSIS — E539 Vitamin B deficiency, unspecified: Secondary | ICD-10-CM | POA: Diagnosis not present

## 2021-09-18 DIAGNOSIS — S7292XD Unspecified fracture of left femur, subsequent encounter for closed fracture with routine healing: Secondary | ICD-10-CM | POA: Diagnosis not present

## 2021-09-19 DIAGNOSIS — Z993 Dependence on wheelchair: Secondary | ICD-10-CM | POA: Diagnosis not present

## 2021-09-19 DIAGNOSIS — W19XXXD Unspecified fall, subsequent encounter: Secondary | ICD-10-CM | POA: Diagnosis not present

## 2021-09-19 DIAGNOSIS — G231 Progressive supranuclear ophthalmoplegia [Steele-Richardson-Olszewski]: Secondary | ICD-10-CM | POA: Diagnosis not present

## 2021-09-19 DIAGNOSIS — S72002D Fracture of unspecified part of neck of left femur, subsequent encounter for closed fracture with routine healing: Secondary | ICD-10-CM | POA: Diagnosis not present

## 2021-09-19 DIAGNOSIS — L89622 Pressure ulcer of left heel, stage 2: Secondary | ICD-10-CM | POA: Diagnosis not present

## 2021-09-19 DIAGNOSIS — Z7401 Bed confinement status: Secondary | ICD-10-CM | POA: Diagnosis not present

## 2021-09-19 NOTE — Telephone Encounter (Signed)
Called patient to check-in, as he is being discharged from SNF today. ? ?Spoke with his wife, [name], who was extremely frustrated and unhappy with the care he has received recently. She voiced a multitude of complaints, mainly regarding the SNF ([facility]).  ?I actively listened to her concerns and provided sympathy where appropriate. ? ?Tried to focus on what we can do going forward and how I can be most helpful as their PCP. They already have home health arranged through [facility] and [facility] (wound care, PT, OT) and it sounds like they are establishing with palliative care.  ? ?Plan is for [name] and a family friend (retired Marine scientist) to provide majority of care once he's home. They have a hospital bed, sit-to-stand, 3-in-1, and condom catheter to use as needed. ? ?We discussed pain control regimen which was one of [name]'s main concerns. He is being sent home with '10mg'$  Oxycontin extended release q8h scheduled and '5mg'$  Oxycodone q4h prn. He was also instructed to take Tylenol '1000mg'$  q8h prn. I advised them to take the Tylenol q8h scheduled (rather than prn) and alternate q4h with the oxycontin. This will hopefully reduce the need for the short acting prn oxycodone which reportedly has been causing him to hallucinate. They verbalized very clear understanding of the plan. ? ?Will follow up at upcoming appt on 4/24, which may need to be changed to video visit due to mobility concerns. ? ? ?[name], MD ?

## 2021-09-23 ENCOUNTER — Telehealth: Payer: Self-pay

## 2021-09-23 DIAGNOSIS — Z96642 Presence of left artificial hip joint: Secondary | ICD-10-CM

## 2021-09-23 DIAGNOSIS — L89622 Pressure ulcer of left heel, stage 2: Secondary | ICD-10-CM | POA: Diagnosis not present

## 2021-09-23 DIAGNOSIS — G231 Progressive supranuclear ophthalmoplegia [Steele-Richardson-Olszewski]: Secondary | ICD-10-CM

## 2021-09-23 NOTE — Telephone Encounter (Signed)
Syna NP with [facility] calls nurse line requesting home health orders.  ? ?Syna reports patients wife would like to use [facility] for skilled nursing for wound care.  ? ?Will forward to PCP.  ?

## 2021-09-23 NOTE — Telephone Encounter (Signed)
Son calls nurse line in regards to home health and his mothers inability to care for his father at home.  ? ?Son reports patient was recently discharged home from SNF and the environment is unsafe.  ? ?Son reports the patient has become very angry and uncooperative and is too heavy for his mother to transfer and ambulate safely.  ? ?Son reports there is no active home health in the home now.  ? ?Patient has an apt with PCP on 4/25, however this will need to be virtual.  ? ?Son is requesting home health and custodial services. ? ?Possible social work to help navigate this challenging time.  ? ?Will forward to PCP.  ?

## 2021-09-24 ENCOUNTER — Ambulatory Visit: Payer: PPO | Admitting: Physical Therapy

## 2021-09-24 NOTE — Telephone Encounter (Signed)
Orders placed for referral to home home. Services to include nursing for wound care, PT/OT, and social work. ?

## 2021-09-26 ENCOUNTER — Telehealth: Payer: Self-pay

## 2021-09-26 DIAGNOSIS — M6281 Muscle weakness (generalized): Secondary | ICD-10-CM | POA: Diagnosis not present

## 2021-09-26 DIAGNOSIS — K589 Irritable bowel syndrome without diarrhea: Secondary | ICD-10-CM | POA: Diagnosis not present

## 2021-09-26 DIAGNOSIS — Z7982 Long term (current) use of aspirin: Secondary | ICD-10-CM | POA: Diagnosis not present

## 2021-09-26 DIAGNOSIS — I10 Essential (primary) hypertension: Secondary | ICD-10-CM | POA: Diagnosis not present

## 2021-09-26 DIAGNOSIS — L89623 Pressure ulcer of left heel, stage 3: Secondary | ICD-10-CM | POA: Diagnosis not present

## 2021-09-26 DIAGNOSIS — S72002D Fracture of unspecified part of neck of left femur, subsequent encounter for closed fracture with routine healing: Secondary | ICD-10-CM

## 2021-09-26 DIAGNOSIS — N3281 Overactive bladder: Secondary | ICD-10-CM | POA: Diagnosis not present

## 2021-09-26 DIAGNOSIS — S7292XD Unspecified fracture of left femur, subsequent encounter for closed fracture with routine healing: Secondary | ICD-10-CM | POA: Diagnosis not present

## 2021-09-26 DIAGNOSIS — Z955 Presence of coronary angioplasty implant and graft: Secondary | ICD-10-CM | POA: Diagnosis not present

## 2021-09-26 DIAGNOSIS — E785 Hyperlipidemia, unspecified: Secondary | ICD-10-CM | POA: Diagnosis not present

## 2021-09-26 DIAGNOSIS — I2510 Atherosclerotic heart disease of native coronary artery without angina pectoris: Secondary | ICD-10-CM | POA: Diagnosis not present

## 2021-09-26 DIAGNOSIS — Z85828 Personal history of other malignant neoplasm of skin: Secondary | ICD-10-CM | POA: Diagnosis not present

## 2021-09-26 DIAGNOSIS — G231 Progressive supranuclear ophthalmoplegia [Steele-Richardson-Olszewski]: Secondary | ICD-10-CM

## 2021-09-26 DIAGNOSIS — Z96642 Presence of left artificial hip joint: Secondary | ICD-10-CM | POA: Diagnosis not present

## 2021-09-26 DIAGNOSIS — J449 Chronic obstructive pulmonary disease, unspecified: Secondary | ICD-10-CM | POA: Diagnosis not present

## 2021-09-26 DIAGNOSIS — K219 Gastro-esophageal reflux disease without esophagitis: Secondary | ICD-10-CM | POA: Diagnosis not present

## 2021-09-26 DIAGNOSIS — E539 Vitamin B deficiency, unspecified: Secondary | ICD-10-CM | POA: Diagnosis not present

## 2021-09-26 DIAGNOSIS — I480 Paroxysmal atrial fibrillation: Secondary | ICD-10-CM | POA: Diagnosis not present

## 2021-09-26 DIAGNOSIS — Z471 Aftercare following joint replacement surgery: Secondary | ICD-10-CM | POA: Diagnosis not present

## 2021-09-26 DIAGNOSIS — Z8673 Personal history of transient ischemic attack (TIA), and cerebral infarction without residual deficits: Secondary | ICD-10-CM | POA: Diagnosis not present

## 2021-09-26 DIAGNOSIS — I4891 Unspecified atrial fibrillation: Secondary | ICD-10-CM | POA: Diagnosis not present

## 2021-09-26 DIAGNOSIS — Z9181 History of falling: Secondary | ICD-10-CM | POA: Diagnosis not present

## 2021-09-26 DIAGNOSIS — G4730 Sleep apnea, unspecified: Secondary | ICD-10-CM | POA: Diagnosis not present

## 2021-09-26 DIAGNOSIS — L89622 Pressure ulcer of left heel, stage 2: Secondary | ICD-10-CM | POA: Diagnosis not present

## 2021-09-26 DIAGNOSIS — N400 Enlarged prostate without lower urinary tract symptoms: Secondary | ICD-10-CM | POA: Diagnosis not present

## 2021-09-26 DIAGNOSIS — F339 Major depressive disorder, recurrent, unspecified: Secondary | ICD-10-CM | POA: Diagnosis not present

## 2021-09-26 NOTE — Telephone Encounter (Signed)
[name] calls nurse line requesting a refill on Oxycodone.  ? ?[name] reports he is taking Oxycodone '10mg'$  TID.  ? ?Will forward to PCP.  ? ?Patient has virtual apt scheduled for 4/25. ?

## 2021-09-26 NOTE — Telephone Encounter (Signed)
NP with [facility] calls nurse line requesting an order for Custodial Care to be placed.  ? ?Np reports this is a separate service from home health nursing, PT, etc.. She reports she spoke to his insurance and they will cover services with his diagnoses.  ? ?Wife is requesting a DME order for condom catheters. Size 32 with drainage bags.  ? ?Will forward to PCP.  ?

## 2021-09-26 NOTE — Telephone Encounter (Signed)
Spoke with Colletta Maryland, NP from [facility] to clarify request for [facility]. ?No order exists in Epic for custodial care specifically but she recommends trying order for home health aide.  ? ?Orders also placed for DME needs as requested.  ? ? ?

## 2021-09-29 MED ORDER — OXYCODONE HCL ER 10 MG PO T12A: 10.0000 mg | EXTENDED_RELEASE_TABLET | Freq: Two times a day (BID) | ORAL | 0 refills | Status: DC

## 2021-09-29 NOTE — Telephone Encounter (Signed)
PDMP reviewed and appropriate. Rx sent for oxycodone extended release '10mg'$  BID. Only sent 5 tablets- will discuss further at his virtual appt tomorrow. ?

## 2021-09-30 ENCOUNTER — Telehealth (INDEPENDENT_AMBULATORY_CARE_PROVIDER_SITE_OTHER): Payer: PPO | Admitting: Family Medicine

## 2021-09-30 ENCOUNTER — Telehealth: Payer: Self-pay

## 2021-09-30 ENCOUNTER — Telehealth: Payer: Self-pay | Admitting: *Deleted

## 2021-09-30 DIAGNOSIS — G2581 Restless legs syndrome: Secondary | ICD-10-CM | POA: Diagnosis not present

## 2021-09-30 DIAGNOSIS — G231 Progressive supranuclear ophthalmoplegia [Steele-Richardson-Olszewski]: Secondary | ICD-10-CM

## 2021-09-30 DIAGNOSIS — Z96642 Presence of left artificial hip joint: Secondary | ICD-10-CM

## 2021-09-30 DIAGNOSIS — S72002S Fracture of unspecified part of neck of left femur, sequela: Secondary | ICD-10-CM

## 2021-09-30 MED ORDER — PRAMIPEXOLE DIHYDROCHLORIDE 0.25 MG PO TABS: 0.2500 mg | ORAL_TABLET | Freq: Two times a day (BID) | ORAL | 0 refills | Status: DC

## 2021-09-30 MED ORDER — HYDROCODONE-ACETAMINOPHEN 10-325 MG PO TABS: 1.0000 | ORAL_TABLET | Freq: Three times a day (TID) | ORAL | 0 refills | Status: DC | PRN

## 2021-09-30 NOTE — Telephone Encounter (Signed)
Received fax from pharmacy, PA needed on Hydrocodone 10-.'325mg'$ . Clinical questions submitted via Cover My Meds. Waiting on response, could take up to 72 hours. ? ?Cover My Meds info: ?Key: [id number] ? ?

## 2021-09-30 NOTE — Telephone Encounter (Signed)
Doctor was able to connect with pt by calling pts wife's number. Mikai Meints Zimmerman Rumple, CMA ? ?

## 2021-09-30 NOTE — Progress Notes (Signed)
[place] ?Telemedicine Visit ? ?Patient consented to have virtual visit and was identified by name and date of birth. ?Method of visit: Telephone ? ?Encounter participants: ?Patient: [name] - located at home ?Provider: [name] - located at [facility] ?Others (if applicable): [name] (wife), [name] (retired Corporate treasurer, close friend) ? ?Chief Complaint: hospital f/u ? ?HPI: ? ?Admitted 3/10-3/23 for left hip fracture s/p fall. Underwent total hip arthroplasty on 3/13. Went to SNF and was then discharged home from SNF on 4/14. ? ?Majority of visit conducted with patient's wife, [name]. ? ?Current issues: ?Pain control- currently taking Oxycontin '10mg'$  q12h and Tylenol '1000mg'$  q8h. Wife feels his hip pain is somewhat controlled on this regimen although he does groan constantly. He is still having hallucinations which she attributes to the oxycontin. She also notes the oxycontin is expensive. ?Lower leg pain-- takes Tizanidine '4mg'$  and Mirapex 0.'25mg'$  at bedtime for restless legs. However he is spending 24/7 in bed, so his restless legs symptoms are worse. They are wondering if he can take more. ?Heel wound-- improving due to excellent wound care by wife and their close friend who is a retired Corporate treasurer, but it is still not healed and is preventing him from standing ?Care needs-- wife and close friend are providing essentially all his care at home and this is very challenging for them. Wife expresses significant frustration with home health services (nursing and PT have only been there once). Feel they need better support at home. They were told they could either pursue palliative services or PT/OT etc, but not both. ? ?Wife overall extremely frustrated and unhappy with his care in the hospital, at SNF, and since discharge from SNF. During our visit today she stated "his care ain't worth shit because he hasn't gotten any" ? ?Wife's long term goal-- "move to [place] where he can get better care."  Family friend reports their long term goal would be to get heel wound healed so that he's able to do ADLs by himself (w/walker, or just minimal assist from wife) ? ? ?Pertinent PMHx: progressive supranuclear palsy ? ?Exam:  ?N/a due to telephone visit. ? ?Assessment/Plan: ? ?Restless legs ?Much worse recently due to essentially bedbound status ?-Increase Mirapex to 0.'25mg'$  BID (wife prefers BID dosing) ?-Continue Tizanidine 0.'4mg'$  nightly ? ?Status post total hip replacement, left ?Pain control remains an issue due to hallucinations w/oxycontin. ?-d/c oxycontin ?-start hydrocodone-acetaminophen 10-325 q8h prn instead ?-can continue to use Tylenol prn but advised not to exceed 4g total daily dose ? ?Progressive Supranuclear Palsy ?Complex Care Needs  ?-Referral to palliative care. This should not prevent him from continuing to receive home health PT/OT/wound care services ?-Offered CCM referral but wife declined ?-Will attempt to arrange home visit via [facility] ? ? ?Time spent during visit with patient: 40 minutes ? ?

## 2021-09-30 NOTE — Telephone Encounter (Signed)
LVM for pt to call office back to go over pre-visit questions before his video visit with doctor today. Shealynn Saulnier Zimmerman Rumple, CMA ? ?

## 2021-09-30 NOTE — Assessment & Plan Note (Signed)
Much worse recently due to essentially bedbound status ?-Increase Mirapex to 0.'25mg'$  BID (wife prefers BID dosing) ?-Continue Tizanidine 0.'4mg'$  nightly ?

## 2021-09-30 NOTE — Assessment & Plan Note (Signed)
Pain control remains an issue due to hallucinations w/oxycontin. ?-d/c oxycontin ?-start hydrocodone-acetaminophen 10-325 q8h prn instead ?-can continue to use Tylenol prn but advised not to exceed 4g total daily dose ?

## 2021-10-01 ENCOUNTER — Telehealth: Payer: Self-pay | Admitting: Student

## 2021-10-01 NOTE — Telephone Encounter (Signed)
Spoke with wife regarding the Palliative referral/services and she has declined to schedule a visit at this time, she stated that patient is working with PT/OT with [facility] (S/P Hip fx) and wanted to see where he is at in a couple of weeks and that she will call us at that time.   ?

## 2021-10-01 NOTE — Telephone Encounter (Signed)
Medication approved through 06/07/2022. ?

## 2021-10-02 ENCOUNTER — Telehealth: Payer: Self-pay

## 2021-10-02 ENCOUNTER — Telehealth: Payer: Self-pay | Admitting: Neurology

## 2021-10-02 DIAGNOSIS — L89623 Pressure ulcer of left heel, stage 3: Secondary | ICD-10-CM | POA: Diagnosis not present

## 2021-10-02 NOTE — Telephone Encounter (Signed)
Patient is home from the hospital now. PSP is much worse she said she is wanting him to start on carbidopa levodopa again and has a whole bottle left, okay to start again? ? ?2.  Patient is unable to sit up, even with two people, and OT stated the stiffness of his muscles needs addressed before he can start OT. ?

## 2021-10-02 NOTE — Telephone Encounter (Signed)
Pt wife stated that she does wants to start the carbidopa levodopa they have a 30 day supply at home they are asking if they can also start the tizanidine and the pramipexole. She stated that his legs are hurting him due to neuropathy and that he is stiff as a board and not able to do this therapy.  ?

## 2021-10-02 NOTE — Telephone Encounter (Signed)
[facility] OT calls nurse line requesting verbal orders for [facility] OT as follows.  ? ?1 week 1  ?0 week 2  ?2 week 3  ? ?Verbal order given.  ?

## 2021-10-03 ENCOUNTER — Encounter: Payer: Self-pay | Admitting: Neurology

## 2021-10-03 NOTE — Telephone Encounter (Signed)
I called the patient's wife this morning to speak with her about the message that was left for me, and right when the patient's wife picked up the phone, she started yelling at me and immediately stated " where have you been for the last month and a half?"  She was yelling about how her husband fractured his hip and had to wait 3 days for surgery because none of the doctors care and do not do surgeries over the weekend.  She stated that each doctor only cares about his/her own specialty and no one pays attention to the patient and we just "walk away."  The patients wife continued to yell for about 5 minutes, at which point I interrupted her and told her I was sorry for her experience and sorry that she was going through all of this.  She then started yelling at me again stating that "everybody is sorry, no one helps, the only one that helps is my friend [name]."  Ultimately, I had to interrupt her because she was yelling into the phone.  I explained to her that I actually did not even know he was in the hospital until she called (at which point she started yelling again because she felt the doctors should have let me know), but wanted to address her phone call.  I explained to her that I always thought we had a good relationship and that the physician/patient relationship is built upon trust.  I told her that I was surprised that she called our office and my care "useless."  I did tell her that I understand that the stress she is under can certainly bring out the worst in  all of [place], but then she started yelling at me again.  She stated that no one here has helped them, she tore up the DNR (which is a good idea if they did not want a DNR), and that she really is the only one helping her husband.  I told her I was trying to help her, but without trust, our relationship has really deteriorated.  Discussed with her that it would be in her/his best interest to find a physician that they trust and feel comfortable with.   She started yelling and asked me if I was trying to "kick her out."  I told her that this was absolutely not what we were doing, but I was trying to help her with her requests, since she felt that our office was not able to help her adequately.  She stated that she could not talk, could not handle the conversation and I would need to talk with her friend [name].  I told her that I really needed to talk with her, but she had already put down the phone and apparently had given the phone to her friend [name] while I was speaking.  Her friend Designer, fashion/clothing, stating that she heard the entire conversation and felt that I did not deserve to be spoken to the way that she spoke to me.  I told her that no apology was necessary, I understood the stress and just wanted the best for the patient.  [name] asked if a new referral for a neurologist just needed to come from the primary care physician, and I told her that would be a good idea.  She thanked me, apologized again, and we ended the call. ?

## 2021-10-03 NOTE — Telephone Encounter (Signed)
Pt wife called back an informed that Dr Tat stated No to the pramipexole and that she did not do the  RX for the tizanadine. Pt wife became really upset ans said that Dr Tat does not care for her pts. Mrs Beauregard was told that Dr Tat dose care for her pts. She stated that she is appalled  by the care her husband has gotten here that that he was diagnosed with PSP and the Dr just walked out of the room and social worker walked in and talked them into a DNR and then they was given a number for a support group. Now when they are trying to get help the only person who they can get information from is one OT lady and no DR will help him per pt wife his he laying there suffering. She stated that his PCP who no longer practiced said that the pramipexole was ok and no to tizanidine. Pt wife was told that as I told her yesterday Dr Tat said if they wanted to she could restart the carbidopa. That's when she asked me if all MD in [place] was useless or just the ones her husband sees,  After she got done voicing her frustration at this nurse before I could say anything else she hung up on me. ?

## 2021-10-05 ENCOUNTER — Other Ambulatory Visit: Payer: Self-pay | Admitting: Family Medicine

## 2021-10-05 DIAGNOSIS — K219 Gastro-esophageal reflux disease without esophagitis: Secondary | ICD-10-CM

## 2021-10-06 DIAGNOSIS — K589 Irritable bowel syndrome without diarrhea: Secondary | ICD-10-CM | POA: Diagnosis not present

## 2021-10-06 DIAGNOSIS — I2510 Atherosclerotic heart disease of native coronary artery without angina pectoris: Secondary | ICD-10-CM | POA: Diagnosis not present

## 2021-10-06 DIAGNOSIS — I10 Essential (primary) hypertension: Secondary | ICD-10-CM | POA: Diagnosis not present

## 2021-10-06 DIAGNOSIS — Z9181 History of falling: Secondary | ICD-10-CM | POA: Diagnosis not present

## 2021-10-06 DIAGNOSIS — G4730 Sleep apnea, unspecified: Secondary | ICD-10-CM | POA: Diagnosis not present

## 2021-10-06 DIAGNOSIS — M6281 Muscle weakness (generalized): Secondary | ICD-10-CM | POA: Diagnosis not present

## 2021-10-06 DIAGNOSIS — F339 Major depressive disorder, recurrent, unspecified: Secondary | ICD-10-CM | POA: Diagnosis not present

## 2021-10-06 DIAGNOSIS — Z85828 Personal history of other malignant neoplasm of skin: Secondary | ICD-10-CM | POA: Diagnosis not present

## 2021-10-06 DIAGNOSIS — E539 Vitamin B deficiency, unspecified: Secondary | ICD-10-CM | POA: Diagnosis not present

## 2021-10-06 DIAGNOSIS — Z471 Aftercare following joint replacement surgery: Secondary | ICD-10-CM | POA: Diagnosis not present

## 2021-10-06 DIAGNOSIS — L89623 Pressure ulcer of left heel, stage 3: Secondary | ICD-10-CM | POA: Diagnosis not present

## 2021-10-06 DIAGNOSIS — Z955 Presence of coronary angioplasty implant and graft: Secondary | ICD-10-CM | POA: Diagnosis not present

## 2021-10-06 DIAGNOSIS — E785 Hyperlipidemia, unspecified: Secondary | ICD-10-CM | POA: Diagnosis not present

## 2021-10-06 DIAGNOSIS — Z8673 Personal history of transient ischemic attack (TIA), and cerebral infarction without residual deficits: Secondary | ICD-10-CM | POA: Diagnosis not present

## 2021-10-06 DIAGNOSIS — J449 Chronic obstructive pulmonary disease, unspecified: Secondary | ICD-10-CM | POA: Diagnosis not present

## 2021-10-06 DIAGNOSIS — I4891 Unspecified atrial fibrillation: Secondary | ICD-10-CM | POA: Diagnosis not present

## 2021-10-06 DIAGNOSIS — Z7982 Long term (current) use of aspirin: Secondary | ICD-10-CM | POA: Diagnosis not present

## 2021-10-06 DIAGNOSIS — S7292XD Unspecified fracture of left femur, subsequent encounter for closed fracture with routine healing: Secondary | ICD-10-CM | POA: Diagnosis not present

## 2021-10-06 DIAGNOSIS — N400 Enlarged prostate without lower urinary tract symptoms: Secondary | ICD-10-CM | POA: Diagnosis not present

## 2021-10-06 DIAGNOSIS — G231 Progressive supranuclear ophthalmoplegia [Steele-Richardson-Olszewski]: Secondary | ICD-10-CM | POA: Diagnosis not present

## 2021-10-06 DIAGNOSIS — N3281 Overactive bladder: Secondary | ICD-10-CM | POA: Diagnosis not present

## 2021-10-06 DIAGNOSIS — K219 Gastro-esophageal reflux disease without esophagitis: Secondary | ICD-10-CM | POA: Diagnosis not present

## 2021-10-06 DIAGNOSIS — I480 Paroxysmal atrial fibrillation: Secondary | ICD-10-CM | POA: Diagnosis not present

## 2021-10-06 DIAGNOSIS — Z96642 Presence of left artificial hip joint: Secondary | ICD-10-CM | POA: Diagnosis not present

## 2021-10-08 ENCOUNTER — Telehealth: Payer: Self-pay | Admitting: Neurology

## 2021-10-08 NOTE — Telephone Encounter (Signed)
Patient dismissed from [facility] 10/03/21 ?

## 2021-10-09 ENCOUNTER — Telehealth: Payer: Self-pay | Admitting: *Deleted

## 2021-10-09 DIAGNOSIS — Z515 Encounter for palliative care: Secondary | ICD-10-CM | POA: Diagnosis not present

## 2021-10-09 DIAGNOSIS — Z7401 Bed confinement status: Secondary | ICD-10-CM | POA: Diagnosis not present

## 2021-10-09 DIAGNOSIS — Z9181 History of falling: Secondary | ICD-10-CM | POA: Diagnosis not present

## 2021-10-09 DIAGNOSIS — I10 Essential (primary) hypertension: Secondary | ICD-10-CM | POA: Diagnosis not present

## 2021-10-09 DIAGNOSIS — S72009D Fracture of unspecified part of neck of unspecified femur, subsequent encounter for closed fracture with routine healing: Secondary | ICD-10-CM | POA: Diagnosis not present

## 2021-10-09 DIAGNOSIS — G231 Progressive supranuclear ophthalmoplegia [Steele-Richardson-Olszewski]: Secondary | ICD-10-CM | POA: Diagnosis not present

## 2021-10-09 DIAGNOSIS — Z87891 Personal history of nicotine dependence: Secondary | ICD-10-CM | POA: Diagnosis not present

## 2021-10-09 DIAGNOSIS — W19XXXD Unspecified fall, subsequent encounter: Secondary | ICD-10-CM | POA: Diagnosis not present

## 2021-10-09 DIAGNOSIS — F331 Major depressive disorder, recurrent, moderate: Secondary | ICD-10-CM | POA: Diagnosis not present

## 2021-10-09 DIAGNOSIS — Z993 Dependence on wheelchair: Secondary | ICD-10-CM | POA: Diagnosis not present

## 2021-10-09 NOTE — Telephone Encounter (Signed)
Pt wife reports that Dr. [name] mentioned doing a home visit with them since he is bed ridden. ? ?They would like to get this set up if still an option. ? ?If the patient needs: ? ? An appointment - route response to [facility] admin ? A callback from clinic staff - route response to your team ? ? ?Only route to RN Team: form completions or if RN team directly requests response sent to them ? ? ?Christen Bame, CMA ? ?

## 2021-10-10 ENCOUNTER — Other Ambulatory Visit: Payer: Self-pay

## 2021-10-10 DIAGNOSIS — S72002S Fracture of unspecified part of neck of left femur, sequela: Secondary | ICD-10-CM

## 2021-10-13 MED ORDER — HYDROCODONE-ACETAMINOPHEN 10-325 MG PO TABS: 1.0000 | ORAL_TABLET | Freq: Three times a day (TID) | ORAL | 0 refills | Status: DC | PRN

## 2021-10-13 NOTE — Telephone Encounter (Signed)
Yes-- in the process of working with Dr McDiarmid to find a mutually convenient time. I will reach out to wife by the end of the day tomorrow, 5/9 to schedule. ? ? ?

## 2021-10-13 NOTE — Telephone Encounter (Signed)
Son calls nurse line in regards to home visit.  ? ?Son requests you call him to set up home visit. Son reports his mother is very stressed right now and "isn't taking information well at this time."  ? ?[name] ?[phone] ?

## 2021-10-14 NOTE — Telephone Encounter (Signed)
?  Had long conversation with patient's son, [name], via telephone. Informed him we are in the process of finding a time for a home visit. He was very appreciative of this and he has encouraged his Mom in particular to be patient while we arrange a time. ? ?Tag also apologized on behalf of his mother for her recent behavior toward medical providers. In a nutshell, he states that [name] does not handle stressful situations well and it can manifest as anger. He too has been on the receiving end of this. ? ?Other salient points from our conversation are as follows: ?-[name] expresses concern that there is some underlying cognitive decline in his Little and that the recent changes in mental status are not all secondary to pain medication. ? ?-[name] states he's aware his Little won't walk again, but his goal is to get to a point where he isn't screaming every time someone touches him, so he can transfer to the bedside commode rather than soil himself.  ? ?[name] would like to be present via telephone during the home visit. If no home visit is able to be arranged within the next 2-3 weeks, we will schedule virtual visit in the meantime. ? ?Of note, on chart review it appears patient has recently been dismissed from [facility] on 5/3. Additionally, wife declined palliative services on 10/01/21. ? ?William Dad, MD ? ? ? ? ?  ? ? ?

## 2021-10-17 ENCOUNTER — Telehealth: Payer: Self-pay | Admitting: Neurology

## 2021-10-17 NOTE — Telephone Encounter (Addendum)
Patient's son called and requested a letter from Dr. Carles Collet. He left a message with [employer]. ? ?He said the patient is no longer going to be seeing Dr. Carles Collet. ? ?However, he is requesting a letter, per son, with a formal diagnosis, PSP, to be mailed to the address on file. ?

## 2021-10-20 ENCOUNTER — Telehealth: Payer: Self-pay | Admitting: Student

## 2021-10-20 NOTE — Telephone Encounter (Signed)
Patients son was very apologetic on the interaction with our office and his mom. I did give him number to medical records and he was very appreciative of our help ?

## 2021-10-20 NOTE — Telephone Encounter (Signed)
Spoke with patient's wife again to f/u with her to see if she wanted to pursue Palliative services for patient.  Wife requested that I speak with her son Natthew (who lives in [place]) and explain everything to him and then son can discuss this with patient's wife.  Wife is very overwhelmed with everything right now.  She gave me permission to speak with her son over the phone about this and she gave me his cell number.  Attempted to reach son with no answer - left message explaining why I was calling and left my call back number ?

## 2021-10-21 ENCOUNTER — Telehealth: Payer: Self-pay

## 2021-10-21 NOTE — Telephone Encounter (Signed)
[name] with [name] LVM on nurse line requesting VO for hospice evaluation.  ? ?Discussed with PCP and at this time a hospice eval is appropriate.  ? ?[name] to give verbal for hospice eval, however had to LVM with on call provider.  ? ?Will recheck in the morning.  ?

## 2021-10-22 ENCOUNTER — Other Ambulatory Visit: Payer: Self-pay

## 2021-10-22 ENCOUNTER — Telehealth: Payer: Self-pay

## 2021-10-22 DIAGNOSIS — S72002S Fracture of unspecified part of neck of left femur, sequela: Secondary | ICD-10-CM

## 2021-10-22 NOTE — Telephone Encounter (Signed)
Verbal orders given to [facility] for Hospice evaluation.  ?

## 2021-10-22 NOTE — Telephone Encounter (Signed)
Son calls nurse line in regards to custodial care referral placed last month.  ? ?Son reports they have not heard anything. Referral was placed on 4/21 and faxed to [facility].  ? ?Son reports he would like for [place] to fax to a different company.  ? ?Referral information faxed to [facility].  ? ?Fax: [phone] ? ?The son has been updated.  ? ? ?

## 2021-10-24 ENCOUNTER — Telehealth: Payer: Self-pay

## 2021-10-24 MED ORDER — HYDROCODONE-ACETAMINOPHEN 10-325 MG PO TABS: 1.0000 | ORAL_TABLET | Freq: Three times a day (TID) | ORAL | 0 refills | Status: DC | PRN

## 2021-10-24 NOTE — Telephone Encounter (Signed)
[facility] PTA calls nurse line requesting verbal orders as follows.   OT evaluation and medical social worker.   Verbal orders given.

## 2021-10-24 NOTE — Telephone Encounter (Signed)
Approved Rx request for norco, 30 tablets which should last until his hospice evaluation.

## 2021-10-28 ENCOUNTER — Telehealth: Payer: Self-pay

## 2021-10-28 DIAGNOSIS — L89623 Pressure ulcer of left heel, stage 3: Secondary | ICD-10-CM | POA: Diagnosis not present

## 2021-10-28 DIAGNOSIS — Z9181 History of falling: Secondary | ICD-10-CM | POA: Diagnosis not present

## 2021-10-28 DIAGNOSIS — Z96642 Presence of left artificial hip joint: Secondary | ICD-10-CM | POA: Diagnosis not present

## 2021-10-28 DIAGNOSIS — Z85828 Personal history of other malignant neoplasm of skin: Secondary | ICD-10-CM | POA: Diagnosis not present

## 2021-10-28 DIAGNOSIS — I10 Essential (primary) hypertension: Secondary | ICD-10-CM | POA: Diagnosis not present

## 2021-10-28 DIAGNOSIS — S7292XD Unspecified fracture of left femur, subsequent encounter for closed fracture with routine healing: Secondary | ICD-10-CM | POA: Diagnosis not present

## 2021-10-28 DIAGNOSIS — E539 Vitamin B deficiency, unspecified: Secondary | ICD-10-CM | POA: Diagnosis not present

## 2021-10-28 DIAGNOSIS — N3281 Overactive bladder: Secondary | ICD-10-CM | POA: Diagnosis not present

## 2021-10-28 DIAGNOSIS — Z955 Presence of coronary angioplasty implant and graft: Secondary | ICD-10-CM | POA: Diagnosis not present

## 2021-10-28 DIAGNOSIS — K589 Irritable bowel syndrome without diarrhea: Secondary | ICD-10-CM | POA: Diagnosis not present

## 2021-10-28 DIAGNOSIS — G231 Progressive supranuclear ophthalmoplegia [Steele-Richardson-Olszewski]: Secondary | ICD-10-CM | POA: Diagnosis not present

## 2021-10-28 DIAGNOSIS — J449 Chronic obstructive pulmonary disease, unspecified: Secondary | ICD-10-CM | POA: Diagnosis not present

## 2021-10-28 DIAGNOSIS — G4730 Sleep apnea, unspecified: Secondary | ICD-10-CM | POA: Diagnosis not present

## 2021-10-28 DIAGNOSIS — K219 Gastro-esophageal reflux disease without esophagitis: Secondary | ICD-10-CM | POA: Diagnosis not present

## 2021-10-28 DIAGNOSIS — M6281 Muscle weakness (generalized): Secondary | ICD-10-CM | POA: Diagnosis not present

## 2021-10-28 DIAGNOSIS — I480 Paroxysmal atrial fibrillation: Secondary | ICD-10-CM | POA: Diagnosis not present

## 2021-10-28 DIAGNOSIS — I2510 Atherosclerotic heart disease of native coronary artery without angina pectoris: Secondary | ICD-10-CM | POA: Diagnosis not present

## 2021-10-28 DIAGNOSIS — Z7982 Long term (current) use of aspirin: Secondary | ICD-10-CM | POA: Diagnosis not present

## 2021-10-28 DIAGNOSIS — F339 Major depressive disorder, recurrent, unspecified: Secondary | ICD-10-CM | POA: Diagnosis not present

## 2021-10-28 DIAGNOSIS — E785 Hyperlipidemia, unspecified: Secondary | ICD-10-CM | POA: Diagnosis not present

## 2021-10-28 DIAGNOSIS — N400 Enlarged prostate without lower urinary tract symptoms: Secondary | ICD-10-CM | POA: Diagnosis not present

## 2021-10-28 DIAGNOSIS — Z8673 Personal history of transient ischemic attack (TIA), and cerebral infarction without residual deficits: Secondary | ICD-10-CM | POA: Diagnosis not present

## 2021-10-28 DIAGNOSIS — Z471 Aftercare following joint replacement surgery: Secondary | ICD-10-CM | POA: Diagnosis not present

## 2021-10-28 DIAGNOSIS — I4891 Unspecified atrial fibrillation: Secondary | ICD-10-CM | POA: Diagnosis not present

## 2021-10-28 NOTE — Telephone Encounter (Signed)
[name] calls nurse line requesting verbal orders for subsequent visit next week.   Verbal given.   William reports she feels he would benefit from a rehabilitation center. Mable Fill is unsure how to go about this without hospitalization first. However, she reports she is "going to try."   Mable Fill is faxing over a FL2 form for PCP to fill out.   Will forward to PCP.

## 2021-10-31 NOTE — Telephone Encounter (Signed)
[name] calls nurse line again in regards to assisting family with facility placement.   [name] reports she does not think this will be possible without a hospital admission.   [name] reports the family is very overwhelmed with patients need for 24 hours care. Patient is full assist at this time with no progression.   The goal for patient is not to walk again, however to be able to do transfers.   Will forward to PCP.

## 2021-10-31 NOTE — Telephone Encounter (Signed)
Case discussed with Dr. McDiarmid. Unfortunately we are not able to admit to the hospital solely for facility placement. I have not received the aforementioned FL-2 but would be happy to fill that out once faxed to our office.  Where do we stand with hospice? Family was pursuing hospice evaluation last I heard.

## 2021-11-04 ENCOUNTER — Other Ambulatory Visit: Payer: Self-pay

## 2021-11-04 DIAGNOSIS — S72002S Fracture of unspecified part of neck of left femur, sequela: Secondary | ICD-10-CM

## 2021-11-05 MED ORDER — HYDROCODONE-ACETAMINOPHEN 10-325 MG PO TABS: 1.0000 | ORAL_TABLET | Freq: Three times a day (TID) | ORAL | 0 refills | Status: DC | PRN

## 2021-11-05 NOTE — Telephone Encounter (Signed)
Family calling again to check the status of medication refill.

## 2021-11-05 NOTE — Telephone Encounter (Signed)
FL-2 completed and placed in "to be faxed" bin in front office. See media tab for copy.

## 2021-11-06 ENCOUNTER — Telehealth: Payer: Self-pay

## 2021-11-06 ENCOUNTER — Telehealth: Payer: Self-pay | Admitting: Student

## 2021-11-06 DIAGNOSIS — E785 Hyperlipidemia, unspecified: Secondary | ICD-10-CM | POA: Diagnosis not present

## 2021-11-06 DIAGNOSIS — Z471 Aftercare following joint replacement surgery: Secondary | ICD-10-CM | POA: Diagnosis not present

## 2021-11-06 DIAGNOSIS — Z9181 History of falling: Secondary | ICD-10-CM | POA: Diagnosis not present

## 2021-11-06 DIAGNOSIS — Z66 Do not resuscitate: Secondary | ICD-10-CM | POA: Diagnosis not present

## 2021-11-06 DIAGNOSIS — S72002S Fracture of unspecified part of neck of left femur, sequela: Secondary | ICD-10-CM

## 2021-11-06 DIAGNOSIS — I480 Paroxysmal atrial fibrillation: Secondary | ICD-10-CM | POA: Diagnosis not present

## 2021-11-06 DIAGNOSIS — Z85828 Personal history of other malignant neoplasm of skin: Secondary | ICD-10-CM | POA: Diagnosis not present

## 2021-11-06 DIAGNOSIS — D692 Other nonthrombocytopenic purpura: Secondary | ICD-10-CM | POA: Diagnosis not present

## 2021-11-06 DIAGNOSIS — F339 Major depressive disorder, recurrent, unspecified: Secondary | ICD-10-CM | POA: Diagnosis not present

## 2021-11-06 DIAGNOSIS — N3281 Overactive bladder: Secondary | ICD-10-CM | POA: Diagnosis not present

## 2021-11-06 DIAGNOSIS — L89623 Pressure ulcer of left heel, stage 3: Secondary | ICD-10-CM | POA: Diagnosis not present

## 2021-11-06 DIAGNOSIS — N400 Enlarged prostate without lower urinary tract symptoms: Secondary | ICD-10-CM | POA: Diagnosis not present

## 2021-11-06 DIAGNOSIS — Z515 Encounter for palliative care: Secondary | ICD-10-CM | POA: Diagnosis not present

## 2021-11-06 DIAGNOSIS — G231 Progressive supranuclear ophthalmoplegia [Steele-Richardson-Olszewski]: Secondary | ICD-10-CM | POA: Diagnosis not present

## 2021-11-06 DIAGNOSIS — I10 Essential (primary) hypertension: Secondary | ICD-10-CM | POA: Diagnosis not present

## 2021-11-06 DIAGNOSIS — Z7401 Bed confinement status: Secondary | ICD-10-CM | POA: Diagnosis not present

## 2021-11-06 DIAGNOSIS — E539 Vitamin B deficiency, unspecified: Secondary | ICD-10-CM | POA: Diagnosis not present

## 2021-11-06 DIAGNOSIS — Z96642 Presence of left artificial hip joint: Secondary | ICD-10-CM | POA: Diagnosis not present

## 2021-11-06 DIAGNOSIS — Z955 Presence of coronary angioplasty implant and graft: Secondary | ICD-10-CM | POA: Diagnosis not present

## 2021-11-06 DIAGNOSIS — Z8781 Personal history of (healed) traumatic fracture: Secondary | ICD-10-CM | POA: Diagnosis not present

## 2021-11-06 DIAGNOSIS — I2510 Atherosclerotic heart disease of native coronary artery without angina pectoris: Secondary | ICD-10-CM | POA: Diagnosis not present

## 2021-11-06 DIAGNOSIS — J449 Chronic obstructive pulmonary disease, unspecified: Secondary | ICD-10-CM | POA: Diagnosis not present

## 2021-11-06 DIAGNOSIS — Z8673 Personal history of transient ischemic attack (TIA), and cerebral infarction without residual deficits: Secondary | ICD-10-CM | POA: Diagnosis not present

## 2021-11-06 DIAGNOSIS — M6281 Muscle weakness (generalized): Secondary | ICD-10-CM | POA: Diagnosis not present

## 2021-11-06 DIAGNOSIS — G4730 Sleep apnea, unspecified: Secondary | ICD-10-CM | POA: Diagnosis not present

## 2021-11-06 DIAGNOSIS — K219 Gastro-esophageal reflux disease without esophagitis: Secondary | ICD-10-CM | POA: Diagnosis not present

## 2021-11-06 DIAGNOSIS — I4891 Unspecified atrial fibrillation: Secondary | ICD-10-CM | POA: Diagnosis not present

## 2021-11-06 DIAGNOSIS — K589 Irritable bowel syndrome without diarrhea: Secondary | ICD-10-CM | POA: Diagnosis not present

## 2021-11-06 DIAGNOSIS — Z7982 Long term (current) use of aspirin: Secondary | ICD-10-CM | POA: Diagnosis not present

## 2021-11-06 DIAGNOSIS — S7292XD Unspecified fracture of left femur, subsequent encounter for closed fracture with routine healing: Secondary | ICD-10-CM | POA: Diagnosis not present

## 2021-11-06 MED ORDER — HYDROCODONE-ACETAMINOPHEN 10-325 MG PO TABS: 1.0000 | ORAL_TABLET | Freq: Three times a day (TID) | ORAL | 0 refills | Status: DC | PRN

## 2021-11-06 NOTE — Telephone Encounter (Signed)
Spoke with patient's wife [name] again about scheduling a Palliative Consult and she requested that I contact Ellard Artis (caregiver) and discuss this, wife provided me with her number:  [phone].  I spoke with [name] and after a lengthy discussion with her it was decided to cancel the referral for now, they are trying to get patient into a rehab facility now.  I instructed the caregiver to please contact the patient's MD if they decided they needed Palliative services in the future and she agreed to relay this information to the patient's wife.

## 2021-11-06 NOTE — Telephone Encounter (Signed)
  Rx sent for 30 day supply of pain medication. PDMP reviewed and this is reasonable given he essentially meets hospice criteria despite wife having declined hospice services recently.   Will try to schedule virtual visit next week to discuss further. He may benefit from dose increases of his pramipexole or tizanadine as these have been more helpful for his pain in the past.   Awaiting update from SW tomorrow regarding facility placement.  During virtual visit will plan to discuss his overall care going forward-- I feel hospice services would be very beneficial. If family continues to decline and there is no progress with facility placement, we will discuss options for home visits.

## 2021-11-06 NOTE — Telephone Encounter (Signed)
Theadora Rama [facility] NP calls nurse line in regards to patient.   Theadora Rama reports patients wife mentioned she doesn't feel like his pain is being controlled. Wife reports they keep running out of medication due to only having a 10 day supply. Wife is requesting a 30 day supply of pain medication when current 10 day runs out.   Theadora Rama also reports patients wife declined hospice services. Theadora Rama has concerns for patient as Gaspar Skeeters (friend home caregiver) is returning home next week to care for her grandchildren during the summer.   Theadora Rama reports social work is planning to meet with the family tomorrow. I updated Brandy on FL2 completion.   Brandy would like PCP thoughts on getting a PCP who makes house calls. Theadora Rama reports she can set this up with PCP ok.   Will forward to PCP.

## 2021-11-07 DIAGNOSIS — Z96642 Presence of left artificial hip joint: Secondary | ICD-10-CM | POA: Diagnosis not present

## 2021-11-07 DIAGNOSIS — K219 Gastro-esophageal reflux disease without esophagitis: Secondary | ICD-10-CM | POA: Diagnosis not present

## 2021-11-07 DIAGNOSIS — E539 Vitamin B deficiency, unspecified: Secondary | ICD-10-CM | POA: Diagnosis not present

## 2021-11-07 DIAGNOSIS — I480 Paroxysmal atrial fibrillation: Secondary | ICD-10-CM | POA: Diagnosis not present

## 2021-11-07 DIAGNOSIS — I2510 Atherosclerotic heart disease of native coronary artery without angina pectoris: Secondary | ICD-10-CM | POA: Diagnosis not present

## 2021-11-07 DIAGNOSIS — K589 Irritable bowel syndrome without diarrhea: Secondary | ICD-10-CM | POA: Diagnosis not present

## 2021-11-07 DIAGNOSIS — F339 Major depressive disorder, recurrent, unspecified: Secondary | ICD-10-CM | POA: Diagnosis not present

## 2021-11-07 DIAGNOSIS — Z9181 History of falling: Secondary | ICD-10-CM | POA: Diagnosis not present

## 2021-11-07 DIAGNOSIS — Z471 Aftercare following joint replacement surgery: Secondary | ICD-10-CM | POA: Diagnosis not present

## 2021-11-07 DIAGNOSIS — I4891 Unspecified atrial fibrillation: Secondary | ICD-10-CM | POA: Diagnosis not present

## 2021-11-07 DIAGNOSIS — N3281 Overactive bladder: Secondary | ICD-10-CM | POA: Diagnosis not present

## 2021-11-07 DIAGNOSIS — Z85828 Personal history of other malignant neoplasm of skin: Secondary | ICD-10-CM | POA: Diagnosis not present

## 2021-11-07 DIAGNOSIS — L89623 Pressure ulcer of left heel, stage 3: Secondary | ICD-10-CM | POA: Diagnosis not present

## 2021-11-07 DIAGNOSIS — Z7982 Long term (current) use of aspirin: Secondary | ICD-10-CM | POA: Diagnosis not present

## 2021-11-07 DIAGNOSIS — M6281 Muscle weakness (generalized): Secondary | ICD-10-CM | POA: Diagnosis not present

## 2021-11-07 DIAGNOSIS — E785 Hyperlipidemia, unspecified: Secondary | ICD-10-CM | POA: Diagnosis not present

## 2021-11-07 DIAGNOSIS — Z8673 Personal history of transient ischemic attack (TIA), and cerebral infarction without residual deficits: Secondary | ICD-10-CM | POA: Diagnosis not present

## 2021-11-07 DIAGNOSIS — Z955 Presence of coronary angioplasty implant and graft: Secondary | ICD-10-CM | POA: Diagnosis not present

## 2021-11-07 DIAGNOSIS — G231 Progressive supranuclear ophthalmoplegia [Steele-Richardson-Olszewski]: Secondary | ICD-10-CM | POA: Diagnosis not present

## 2021-11-07 DIAGNOSIS — S7292XD Unspecified fracture of left femur, subsequent encounter for closed fracture with routine healing: Secondary | ICD-10-CM | POA: Diagnosis not present

## 2021-11-07 DIAGNOSIS — J449 Chronic obstructive pulmonary disease, unspecified: Secondary | ICD-10-CM | POA: Diagnosis not present

## 2021-11-07 DIAGNOSIS — N400 Enlarged prostate without lower urinary tract symptoms: Secondary | ICD-10-CM | POA: Diagnosis not present

## 2021-11-07 DIAGNOSIS — I10 Essential (primary) hypertension: Secondary | ICD-10-CM | POA: Diagnosis not present

## 2021-11-07 DIAGNOSIS — G4730 Sleep apnea, unspecified: Secondary | ICD-10-CM | POA: Diagnosis not present

## 2021-11-07 NOTE — Telephone Encounter (Signed)
[name] has been updated and appt has been created and confirmed with caregiver - Mariea Clonts.  We will send video link to her cell phone that day as pts mychart can not be accessed as this time.  Christen Bame, CMA

## 2021-11-11 ENCOUNTER — Encounter: Payer: Self-pay | Admitting: *Deleted

## 2021-11-12 ENCOUNTER — Telehealth (INDEPENDENT_AMBULATORY_CARE_PROVIDER_SITE_OTHER): Payer: PPO | Admitting: Family Medicine

## 2021-11-12 DIAGNOSIS — Z7189 Other specified counseling: Secondary | ICD-10-CM

## 2021-11-12 DIAGNOSIS — G231 Progressive supranuclear ophthalmoplegia [Steele-Richardson-Olszewski]: Secondary | ICD-10-CM

## 2021-11-12 DIAGNOSIS — I10 Essential (primary) hypertension: Secondary | ICD-10-CM

## 2021-11-12 DIAGNOSIS — F339 Major depressive disorder, recurrent, unspecified: Secondary | ICD-10-CM

## 2021-11-12 DIAGNOSIS — G2581 Restless legs syndrome: Secondary | ICD-10-CM | POA: Diagnosis not present

## 2021-11-12 MED ORDER — TIZANIDINE HCL 4 MG PO TABS: 4.0000 mg | ORAL_TABLET | Freq: Three times a day (TID) | ORAL | 1 refills | Status: DC

## 2021-11-12 MED ORDER — BISOPROLOL FUMARATE 5 MG PO TABS: 5.0000 mg | ORAL_TABLET | Freq: Every day | ORAL | 1 refills | Status: DC

## 2021-11-12 MED ORDER — CITALOPRAM HYDROBROMIDE 10 MG PO TABS: 20.0000 mg | ORAL_TABLET | Freq: Every day | ORAL | 1 refills | Status: DC

## 2021-11-12 MED ORDER — PRAMIPEXOLE DIHYDROCHLORIDE 0.25 MG PO TABS: 0.2500 mg | ORAL_TABLET | Freq: Three times a day (TID) | ORAL | 1 refills | Status: DC

## 2021-11-12 NOTE — Progress Notes (Signed)
[place] Telemedicine Visit  Patient consented to have virtual visit and was identified by name and date of birth. Method of visit: Telephone  Encounter participants: Patient: [name] - located at home  Provider: Alcus Little - located at [facility] Others (if applicable): Duane Boston (spouse), Ellard Artis (friend, retired Marine scientist)  Chief Complaint: Follow up [place] and hip fracture s/p total hip arthroplasty  HPI:  Spoke with [name], his wife [name], and their family friend [name] all on speaker phone.  [name] states "I'm hanging in there".  He reports pain in bilateral feet and toes described as a pulsating sensation. This pain is worst in the afternoon.  Radiates up to his knees when it's particularly bad.  He finds the tizanidine and pramipexole very helpful, but feels they wear off after 8 hours.  He is currently taking these BID and they were inquiring about increasing to TID.  His hip pain is adequately controlled with Norco 10-'325mg'$  every 8 hours and Little-dose Tylenol prn for breakthrough pain.  From a functional standpoint, he is mostly unable to transfer out of bed.  With the assistance of [name], he is able to sit on side of bed and dangle his feet, but cannot stand.  Physical therapy is able to assist him into a chair.  He is very motivated to get out of bed again. Per [name], this is a great improvement in the last 2-3 weeks.  With regard to his level of care, family is "hitting a point where it has become too much" to care for him in the home. [name] will be leaving in 1 week to care for her own family members. Custodial care has been approved by their insurance but the company thus far has been unable to find a caregiver to staff the position. They reportedly have a bed offer at [facility] but are waiting on insurance approval. He was previously receiving PT twice weekly. They are waiting to hear whether insurance will  approve ongoing [facility].  Family declined hospice evaluation because they would've lost PT services and they did not want this.   They are also wondering if he can resume carbidopa-levodopa. Previously on 25-'100mg'$  TID and while they recognize he does not have Parkinson's, they did find this helpful for his PSP symptoms.   Lastly, asking for Celexa '10mg'$  tablets and Bisoprolol '5mg'$  tablets to be sent so they don't have to split his pills in half.    Pertinent PMHx: progressive supranuclear palsy, depression, HTN, pAF  Exam:  [name] checked his vitals at the beginning of visit: BP 114/75, T 49F, HR 62, RR 16 Resp: no distress  Assessment/Plan:  Restless legs Discussed with pharmacy team. -Will increase Tizanidine '4mg'$  and Pramipexole 0.'25mg'$  to TID (from BID previously) -Counseled on potential for increased sedation and orthostatic dizziness  Progressive supranuclear palsy ([name]) Patient/family inquiring about resuming Sinemet 25-'100mg'$  TID. Normally would defer to neurology, however he is not followed by neurologist currently. While this is not typically indicated for PSP, family anecdotally felt it helped with his symptoms. Will discuss with Dr McDiarmid regarding risk/benefit ratio.  Goals of care, counseling/discussion Ideal scenario: stay in the home with enough support (custodial care, regular PT/OT). Wife unable to provide care alone. Next best: SNF/rehab facility Last resort: palliative/hospice evaluation Waiting to hear from insurance re: approval for SNF [facility]). Also waiting for custodial care (which has been approved, but they are looking for caretaker to fill the  position). Not interested in hospice/palliative at the present time. Will continue to have ongoing goals of care discussions.   Med Refills Rx sent for '10mg'$  tablets of Celexa (take 2 pills daily) and '5mg'$  tablets of bisoprolol (take 1 pill daily) so they no longer need to cut pills in half.   Time spent  during visit with patient: 28 minutes   William Dad, MD PGY-2, [place]

## 2021-11-12 NOTE — Assessment & Plan Note (Signed)
Discussed with pharmacy team. -Will increase Tizanidine '4mg'$  and Pramipexole 0.'25mg'$  to TID (from BID previously) -Counseled on potential for increased sedation and orthostatic dizziness

## 2021-11-14 DIAGNOSIS — Z7189 Other specified counseling: Secondary | ICD-10-CM | POA: Insufficient documentation

## 2021-11-14 NOTE — Assessment & Plan Note (Signed)
Ideal scenario: stay in the home with enough support (custodial care, regular PT/OT). Wife unable to provide care alone. Next best: SNF/rehab facility Last resort: palliative/hospice evaluation Waiting to hear from insurance re: approval for SNF [facility]). Also waiting for custodial care (which has been approved, but they are looking for caretaker to fill the position). Not interested in hospice/palliative at the present time. Will continue to have ongoing goals of care discussions.

## 2021-11-14 NOTE — Assessment & Plan Note (Signed)
Patient/family inquiring about resuming Sinemet 25-'100mg'$  TID. Normally would defer to neurology, however he is not followed by neurologist currently. While this is not typically indicated for PSP, family anecdotally felt it helped with his symptoms. Will discuss with Dr McDiarmid regarding risk/benefit ratio.

## 2021-11-16 DIAGNOSIS — G231 Progressive supranuclear ophthalmoplegia [Steele-Richardson-Olszewski]: Secondary | ICD-10-CM | POA: Diagnosis not present

## 2021-11-17 ENCOUNTER — Telehealth: Payer: Self-pay

## 2021-11-17 DIAGNOSIS — R310 Gross hematuria: Secondary | ICD-10-CM

## 2021-11-17 NOTE — Telephone Encounter (Signed)
[name] calls nurse line reporting hematuria since Saturday.   [name] reports she has a sterile cup from [facility] and is requesting to drop sample off for testing.   [name] denies any fevers or increase of pain. Patient has put out over 2,000 cc of urine in the last 24 hours. Denies any abnormal urine odor. Denies any fevers or chill.   Will forward to PCP for advisement.

## 2021-11-17 NOTE — Telephone Encounter (Signed)
[name] will drop off early am tomorrow.

## 2021-11-17 NOTE — Telephone Encounter (Signed)
OK to bring sterile urine specimen. Order placed for urine culture.

## 2021-11-18 DIAGNOSIS — R310 Gross hematuria: Secondary | ICD-10-CM | POA: Diagnosis not present

## 2021-11-18 DIAGNOSIS — G231 Progressive supranuclear ophthalmoplegia [Steele-Richardson-Olszewski]: Secondary | ICD-10-CM | POA: Diagnosis not present

## 2021-11-20 DIAGNOSIS — G231 Progressive supranuclear ophthalmoplegia [Steele-Richardson-Olszewski]: Secondary | ICD-10-CM | POA: Diagnosis not present

## 2021-11-20 LAB — URINE CULTURE

## 2021-11-20 NOTE — Telephone Encounter (Signed)
[name] returns call to nurse line requesting to speak with provider regarding results and plan of care.   Please advise.   Talbot Grumbling, RN

## 2021-11-21 DIAGNOSIS — G231 Progressive supranuclear ophthalmoplegia [Steele-Richardson-Olszewski]: Secondary | ICD-10-CM | POA: Diagnosis not present

## 2021-11-21 MED ORDER — CEPHALEXIN 500 MG PO CAPS: 500.0000 mg | ORAL_CAPSULE | Freq: Two times a day (BID) | ORAL | 0 refills | Status: AC

## 2021-11-21 NOTE — Telephone Encounter (Signed)
Returned [name]'s call to discuss results. Urine culture was inconclusive (greater than 2 species present, none predominant).   He is still having slight hematuria as well as back discomfort. No fever, chills, nausea, vomiting. Ideally would have urology evaluation due to gross hematuria, but this is not feasible due to patient's overall clinical status (essentially bedbound secondary to PSP). Additionally, given his overall poor prognosis related to his PSP, urology eval would likely not change management at this point.  With this in mind, will empirically treat for UTI. Rx sent for Keflex '500mg'$  BID x7 days. [name] in agreement with this plan. They will call if no resolution after course of antibiotics.  Discussed with Dr Nori Riis.  [name], MD PGY-2 [facility]

## 2021-11-21 NOTE — Addendum Note (Signed)
Addended by: [name] on: 11/21/2021 04:36 PM   Modules accepted: Orders

## 2021-11-24 DIAGNOSIS — G231 Progressive supranuclear ophthalmoplegia [Steele-Richardson-Olszewski]: Secondary | ICD-10-CM | POA: Diagnosis not present

## 2021-11-26 DIAGNOSIS — G231 Progressive supranuclear ophthalmoplegia [Steele-Richardson-Olszewski]: Secondary | ICD-10-CM | POA: Diagnosis not present

## 2021-11-28 DIAGNOSIS — G231 Progressive supranuclear ophthalmoplegia [Steele-Richardson-Olszewski]: Secondary | ICD-10-CM | POA: Diagnosis not present

## 2021-12-05 ENCOUNTER — Other Ambulatory Visit: Payer: Self-pay | Admitting: Family Medicine

## 2021-12-05 DIAGNOSIS — I10 Essential (primary) hypertension: Secondary | ICD-10-CM

## 2021-12-15 ENCOUNTER — Other Ambulatory Visit: Payer: Self-pay

## 2021-12-15 DIAGNOSIS — S72002S Fracture of unspecified part of neck of left femur, sequela: Secondary | ICD-10-CM

## 2021-12-15 DIAGNOSIS — E782 Mixed hyperlipidemia: Secondary | ICD-10-CM

## 2021-12-15 DIAGNOSIS — S72002D Fracture of unspecified part of neck of left femur, subsequent encounter for closed fracture with routine healing: Secondary | ICD-10-CM

## 2021-12-16 MED ORDER — PRAVASTATIN SODIUM 80 MG PO TABS: 80.0000 mg | ORAL_TABLET | Freq: Every day | ORAL | 0 refills | Status: DC

## 2021-12-16 MED ORDER — HYDROCODONE-ACETAMINOPHEN 10-325 MG PO TABS: 1.0000 | ORAL_TABLET | Freq: Three times a day (TID) | ORAL | 0 refills | Status: DC | PRN

## 2021-12-22 DIAGNOSIS — I480 Paroxysmal atrial fibrillation: Secondary | ICD-10-CM | POA: Diagnosis not present

## 2021-12-22 DIAGNOSIS — Z66 Do not resuscitate: Secondary | ICD-10-CM | POA: Diagnosis not present

## 2021-12-22 DIAGNOSIS — I10 Essential (primary) hypertension: Secondary | ICD-10-CM | POA: Diagnosis not present

## 2021-12-22 DIAGNOSIS — S72009D Fracture of unspecified part of neck of unspecified femur, subsequent encounter for closed fracture with routine healing: Secondary | ICD-10-CM | POA: Diagnosis not present

## 2021-12-22 DIAGNOSIS — Z515 Encounter for palliative care: Secondary | ICD-10-CM | POA: Diagnosis not present

## 2021-12-22 DIAGNOSIS — F331 Major depressive disorder, recurrent, moderate: Secondary | ICD-10-CM | POA: Diagnosis not present

## 2021-12-22 DIAGNOSIS — G231 Progressive supranuclear ophthalmoplegia [Steele-Richardson-Olszewski]: Secondary | ICD-10-CM | POA: Diagnosis not present

## 2021-12-22 DIAGNOSIS — Z7401 Bed confinement status: Secondary | ICD-10-CM | POA: Diagnosis not present

## 2021-12-29 ENCOUNTER — Other Ambulatory Visit: Payer: Self-pay | Admitting: Family Medicine

## 2021-12-29 DIAGNOSIS — I10 Essential (primary) hypertension: Secondary | ICD-10-CM

## 2021-12-29 DIAGNOSIS — G2581 Restless legs syndrome: Secondary | ICD-10-CM

## 2022-01-04 ENCOUNTER — Other Ambulatory Visit: Payer: Self-pay | Admitting: Family Medicine

## 2022-01-04 DIAGNOSIS — I10 Essential (primary) hypertension: Secondary | ICD-10-CM

## 2022-01-05 ENCOUNTER — Other Ambulatory Visit: Payer: Self-pay | Admitting: Family Medicine

## 2022-01-05 ENCOUNTER — Other Ambulatory Visit: Payer: Self-pay

## 2022-01-05 DIAGNOSIS — G2581 Restless legs syndrome: Secondary | ICD-10-CM

## 2022-01-05 DIAGNOSIS — S72002S Fracture of unspecified part of neck of left femur, sequela: Secondary | ICD-10-CM

## 2022-01-05 MED ORDER — HYDROCODONE-ACETAMINOPHEN 10-325 MG PO TABS: 1.0000 | ORAL_TABLET | Freq: Three times a day (TID) | ORAL | 0 refills | Status: DC | PRN

## 2022-01-05 NOTE — Telephone Encounter (Signed)
Patient's wife calls nurse line regarding refill on pain medication. Wife reports that patient will run out before the weekend and wanted to call in advance to ensure there was no issues with getting the prescription refilled.   Forwarding to PCP.   Talbot Grumbling, RN

## 2022-01-08 ENCOUNTER — Ambulatory Visit: Payer: PPO | Admitting: Neurology

## 2022-01-09 ENCOUNTER — Other Ambulatory Visit: Payer: Self-pay

## 2022-01-09 MED ORDER — TRIAMCINOLONE ACETONIDE 0.1 % EX CREA: 1.0000 | TOPICAL_CREAM | CUTANEOUS | 0 refills | Status: DC

## 2022-01-09 NOTE — Telephone Encounter (Signed)
Wife calls nurse line requesting a refill on Triamcinolone Cream.    Wife reports this was [place]. She reports he uses this for a rash around his nose. She reports he goes through about a tube ~ 2 years.   Will forward to PCP.

## 2022-01-20 ENCOUNTER — Telehealth: Payer: Self-pay

## 2022-01-20 NOTE — Telephone Encounter (Signed)
Received phone call from [place] at [facility].   Patient and family are requesting hospice services.   Levada Dy is requesting returned phone call as to provider preference on who will continue to manage patient's care.   Our office can continue to be patient's attending healthcare provider or we can authorize attending at [facility] to take over attending care.   Will forward to PCP. Please advise.   Talbot Grumbling, RN

## 2022-01-21 NOTE — Telephone Encounter (Signed)
Spoke with Levada Dy from [facility]. We agreed it would be best for hospice provider to take over the entirety of his medical care going forward. Family is agreeable with this plan.  [name], MD PGY-3, [place]

## 2022-01-28 ENCOUNTER — Other Ambulatory Visit: Payer: Self-pay | Admitting: Family Medicine

## 2022-01-28 DIAGNOSIS — G2581 Restless legs syndrome: Secondary | ICD-10-CM

## 2022-01-31 ENCOUNTER — Other Ambulatory Visit: Payer: Self-pay | Admitting: Family Medicine

## 2022-01-31 DIAGNOSIS — G2581 Restless legs syndrome: Secondary | ICD-10-CM

## 2022-03-10 ENCOUNTER — Other Ambulatory Visit: Payer: Self-pay | Admitting: Family Medicine

## 2022-03-10 DIAGNOSIS — E782 Mixed hyperlipidemia: Secondary | ICD-10-CM

## 2022-03-24 IMAGING — CT CT HEAD W/O CM
3 of 4 series · 16 of 47 positions shown, 19 images · non-contrast
Comparison: Head CT dated 07/09/2021.

CLINICAL DATA: Head trauma.



[Series 3: head 5.0 h30s · axial · 0.42mm/px · z∈[-114,+31]mm · 10 of 35 slices shown, 13 images]
[im 3/35  brain]
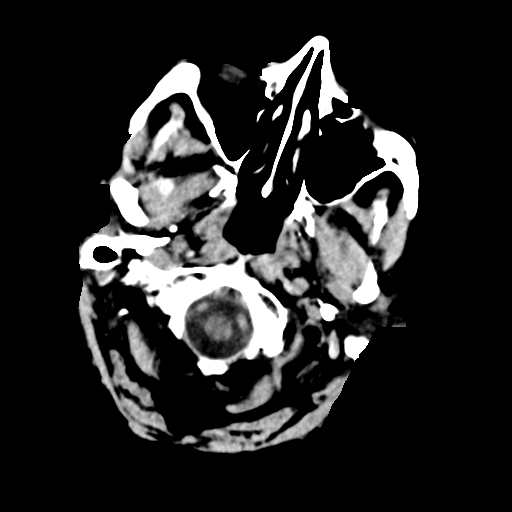
[im 3/35  bone]
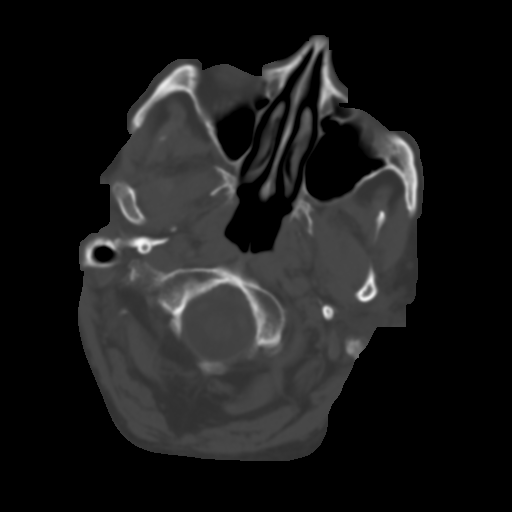
[im 5/35  brain]
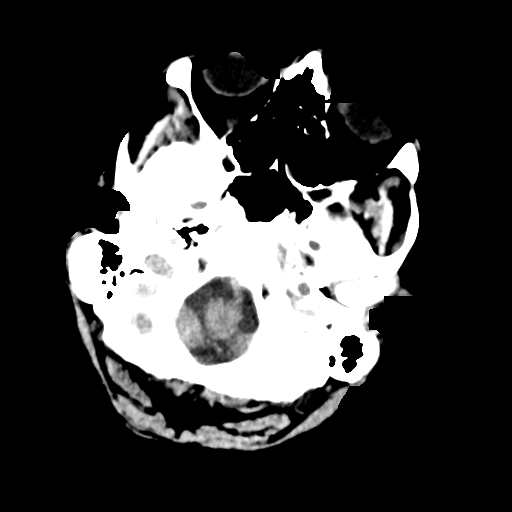
[im 10/35  brain]
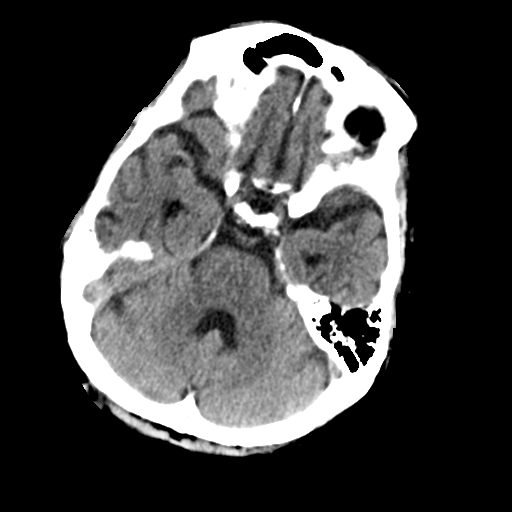
[im 13/35  brain]
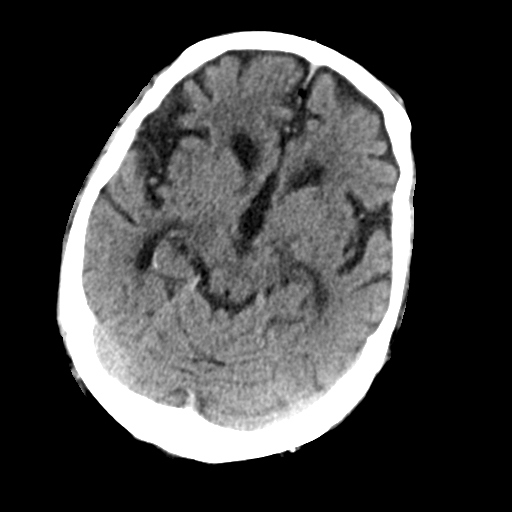
[im 15/35  brain]
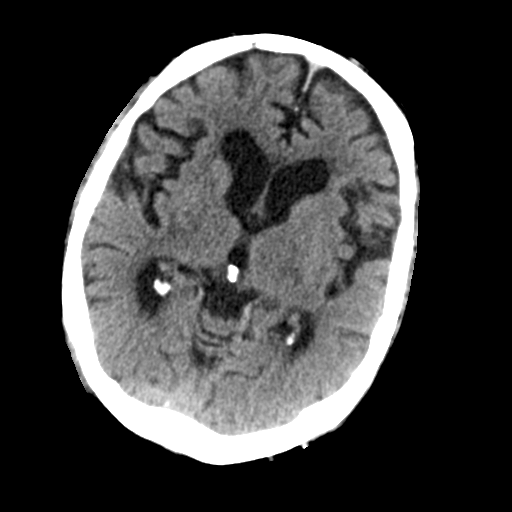
[im 15/35  bone]
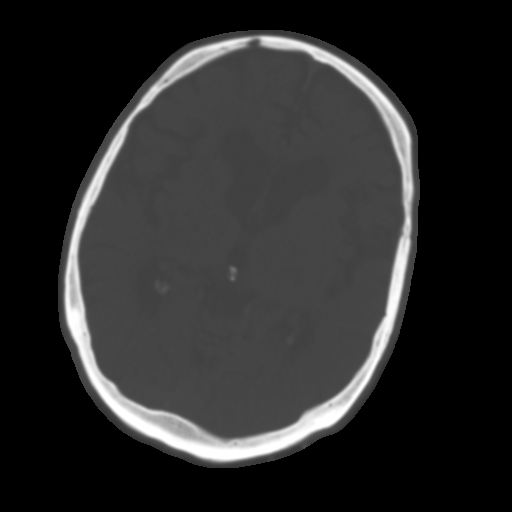
[im 20/35  brain]
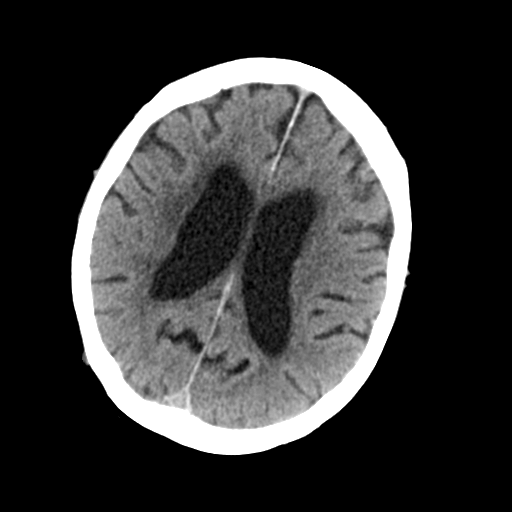
[im 22/35  brain]
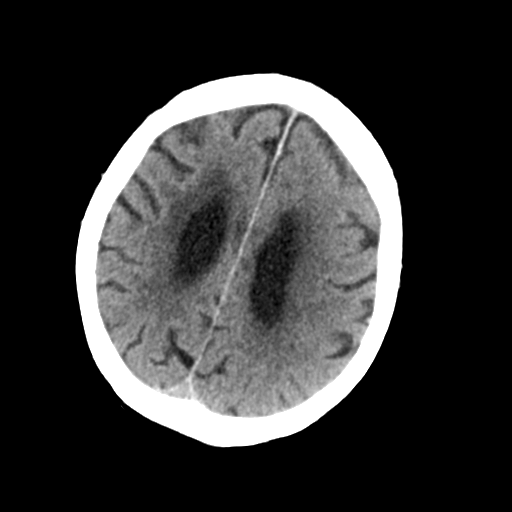
[im 25/35  brain]
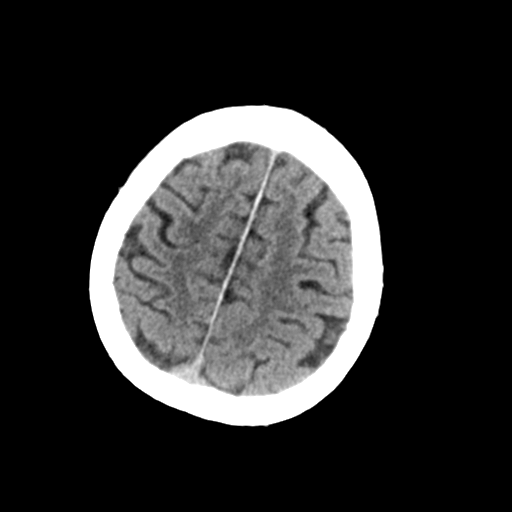
[im 30/35  brain]
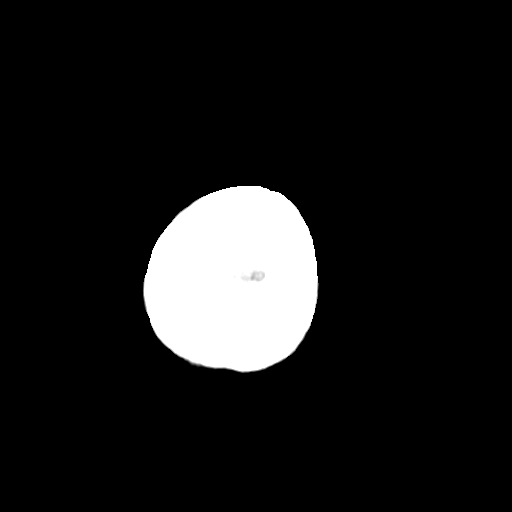
[im 30/35  bone]
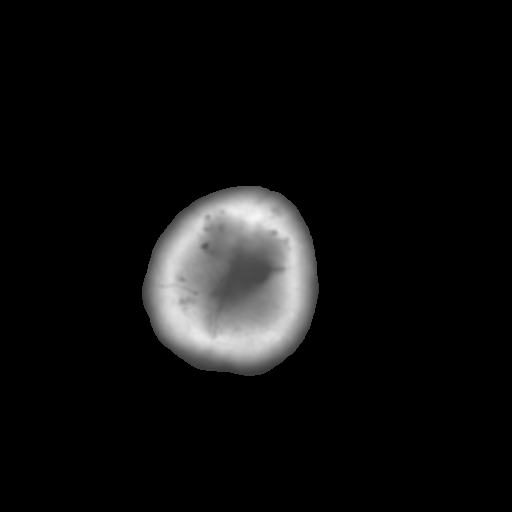
[im 32/35  brain]
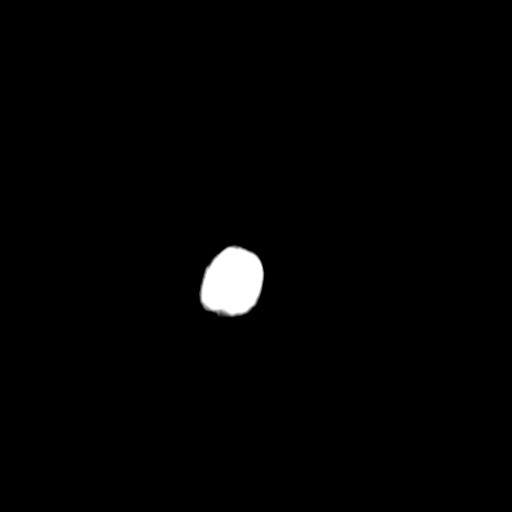

[Series 5: head 3.0 mpr cor · coronal · 0.36mm/px · 3 of 67 slices shown]
[im 23/67  brain]
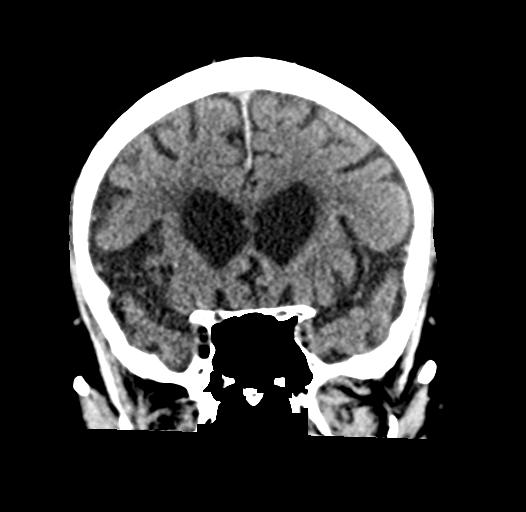
[im 30/67  brain]
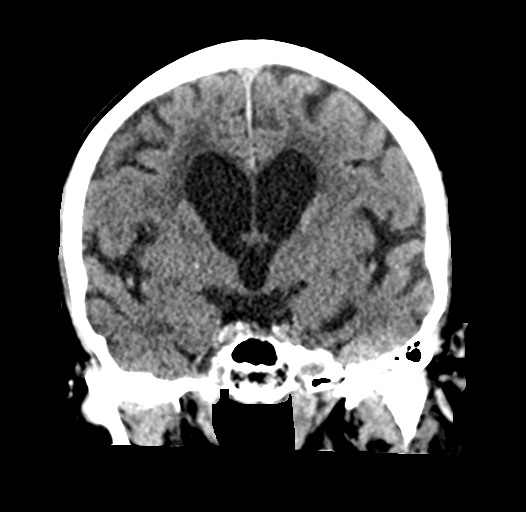
[im 37/67  brain]
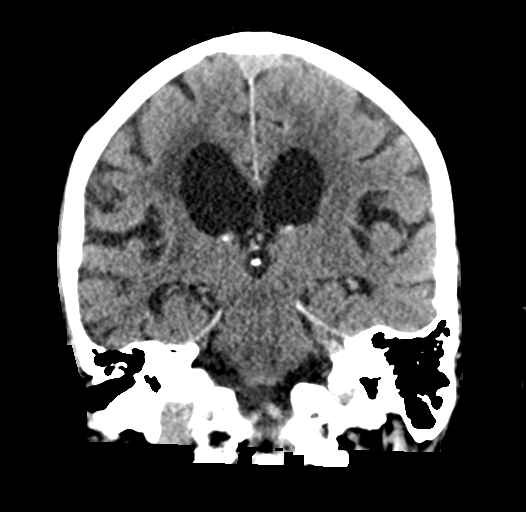

[Series 6: head 3.0 mpr sag · sagittal · 0.33mm/px · 3 of 67 slices shown]
[im 23/67  brain]
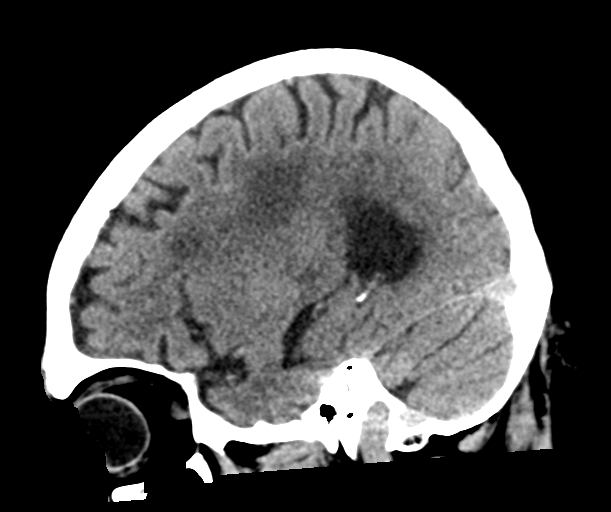
[im 34/67  brain]
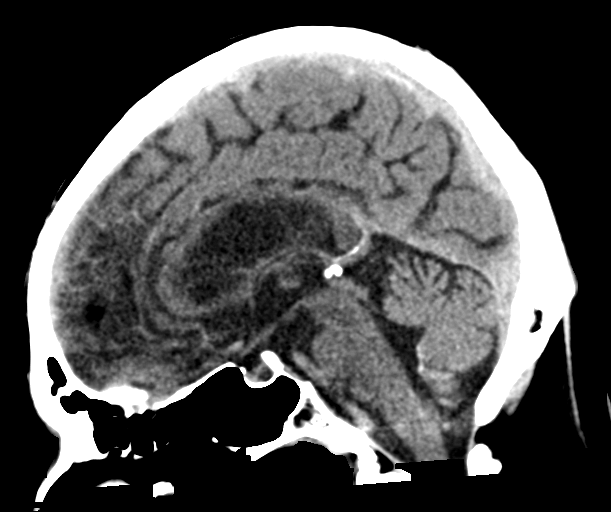
[im 45/67  brain]
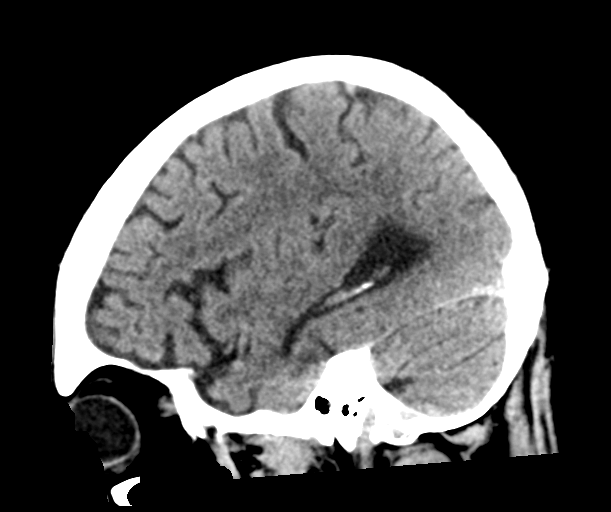

[16 of 47 positions shown; findings below may reference images not displayed]

FINDINGS: Brain: Moderate age-related atrophy and chronic microvascular
ischemic changes. There is no acute intracranial hemorrhage. No mass
effect or midline shift. No extra-axial fluid collection.

Vascular: No hyperdense vessel or unexpected calcification.

Skull: Normal. Negative for fracture or focal lesion.

Sinuses/Orbits: No acute finding.

Other: None
IMPRESSION: 1. No acute intracranial pathology.
2. Moderate age-related atrophy and chronic microvascular ischemic
changes.

## 2022-03-27 IMAGING — DX DG PORTABLE PELVIS
1 series · 1 of 1 positions shown · non-contrast
Comparison: 08/15/2021

CLINICAL DATA: Postoperative left hip

EXAM:
PORTABLE PELVIS 1-2 VIEWS

[pelvis ap]
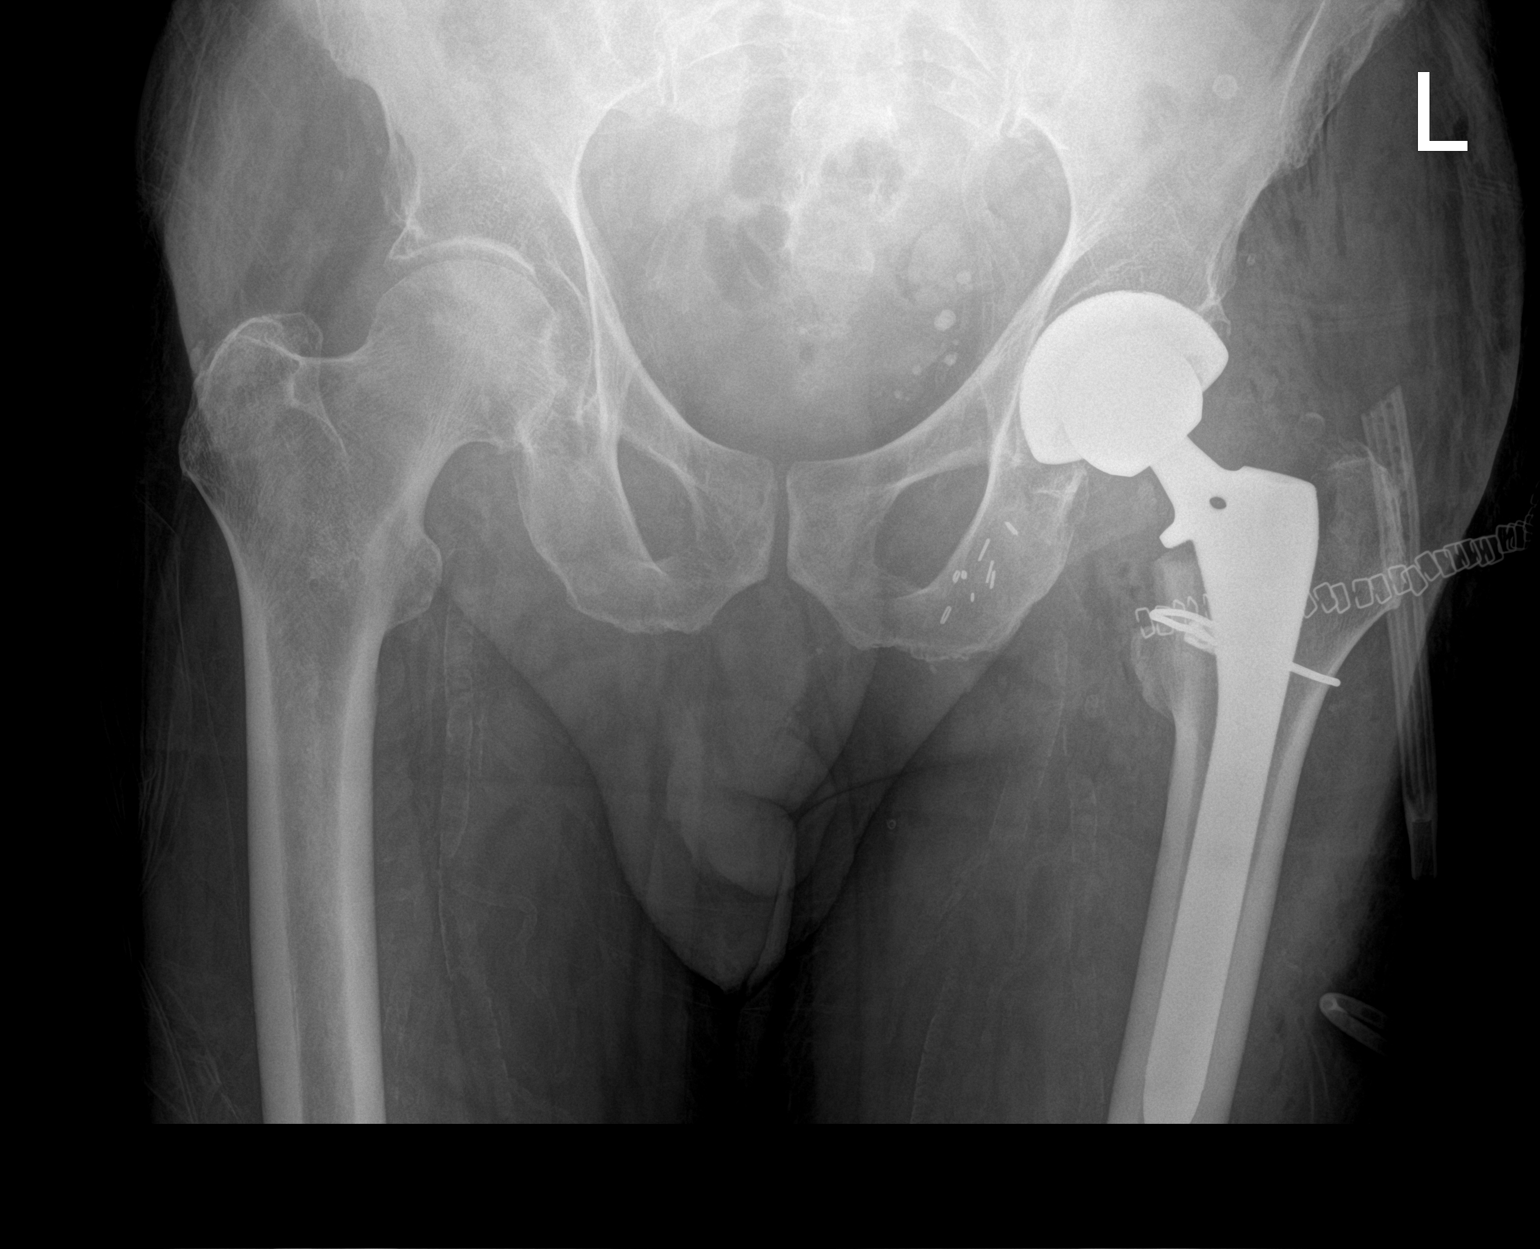

[1 of 1 positions shown; findings below may reference images not displayed]

FINDINGS: Interval postoperative changes with placement of a left total hip
arthroplasty using non cemented components. Components appear well
seated. No acute fracture or dislocation identified. Cerclage wire
over the inter trochanteric region. Skin clips and surgical drain as
well as soft tissue gas are consistent with history of recent
surgery. Degenerative changes in the right hip. Vascular
calcifications.
IMPRESSION: Interval left hip arthroplasty. Components appear well seated.
Expected postoperative changes.

## 2022-08-11 DIAGNOSIS — M25552 Pain in left hip: Secondary | ICD-10-CM | POA: Diagnosis not present

## 2022-08-11 DIAGNOSIS — M25562 Pain in left knee: Secondary | ICD-10-CM | POA: Diagnosis not present

## 2022-10-02 ENCOUNTER — Telehealth: Payer: Self-pay | Admitting: Family Medicine

## 2022-10-02 NOTE — Telephone Encounter (Signed)
Called patient to schedule Medicare Annual Wellness Visit (AWV). Left message for patient to call back and schedule Medicare Annual Wellness Visit (AWV).  Last date of AWV: 10/25/2017   Please schedule an AWVS appointment at any time with [facility] VISIT.  If any questions, please contact me at [phone].    Thank you,  [facility] Support [facility] Direct dial  [phone]

## 2022-10-07 DIAGNOSIS — R059 Cough, unspecified: Secondary | ICD-10-CM | POA: Diagnosis not present

## 2022-10-18 DIAGNOSIS — J189 Pneumonia, unspecified organism: Secondary | ICD-10-CM | POA: Diagnosis not present

## 2023-02-07 DEATH — deceased

## 2023-04-21 NOTE — Telephone Encounter (Signed)
error
